# Patient Record
Sex: Female | Born: 1935 | Race: Black or African American | Hispanic: No | Smoking: Former smoker
Health system: Southern US, Community
[De-identification: ages and names within clinical notes are randomized; demographics above are authoritative.]

## PROBLEM LIST (undated history)

## (undated) DIAGNOSIS — K259 Gastric ulcer, unspecified as acute or chronic, without hemorrhage or perforation: Secondary | ICD-10-CM

## (undated) DIAGNOSIS — I6529 Occlusion and stenosis of unspecified carotid artery: Secondary | ICD-10-CM

## (undated) DIAGNOSIS — I639 Cerebral infarction, unspecified: Secondary | ICD-10-CM

## (undated) DIAGNOSIS — M949 Disorder of cartilage, unspecified: Secondary | ICD-10-CM

## (undated) DIAGNOSIS — I739 Peripheral vascular disease, unspecified: Secondary | ICD-10-CM

## (undated) DIAGNOSIS — I4891 Unspecified atrial fibrillation: Secondary | ICD-10-CM

## (undated) DIAGNOSIS — E119 Type 2 diabetes mellitus without complications: Secondary | ICD-10-CM

## (undated) DIAGNOSIS — M899 Disorder of bone, unspecified: Secondary | ICD-10-CM

## (undated) DIAGNOSIS — K5730 Diverticulosis of large intestine without perforation or abscess without bleeding: Secondary | ICD-10-CM

## (undated) DIAGNOSIS — I495 Sick sinus syndrome: Secondary | ICD-10-CM

## (undated) DIAGNOSIS — I10 Essential (primary) hypertension: Secondary | ICD-10-CM

## (undated) DIAGNOSIS — I82409 Acute embolism and thrombosis of unspecified deep veins of unspecified lower extremity: Secondary | ICD-10-CM

## (undated) DIAGNOSIS — K922 Gastrointestinal hemorrhage, unspecified: Secondary | ICD-10-CM

## (undated) DIAGNOSIS — K219 Gastro-esophageal reflux disease without esophagitis: Secondary | ICD-10-CM

## (undated) DIAGNOSIS — Z86010 Personal history of colonic polyps: Secondary | ICD-10-CM

## (undated) DIAGNOSIS — E785 Hyperlipidemia, unspecified: Secondary | ICD-10-CM

## (undated) HISTORY — DX: Peripheral vascular disease, unspecified: I73.9

## (undated) HISTORY — DX: Acute embolism and thrombosis of unspecified deep veins of unspecified lower extremity: I82.409

## (undated) HISTORY — PX: EYE SURGERY: SHX253

## (undated) HISTORY — DX: Type 2 diabetes mellitus without complications: E11.9

## (undated) HISTORY — DX: Gastric ulcer, unspecified as acute or chronic, without hemorrhage or perforation: K25.9

## (undated) HISTORY — DX: Essential (primary) hypertension: I10

## (undated) HISTORY — DX: Cerebral infarction, unspecified: I63.9

## (undated) HISTORY — PX: OTHER SURGICAL HISTORY: SHX169

## (undated) HISTORY — DX: Occlusion and stenosis of unspecified carotid artery: I65.29

## (undated) HISTORY — PX: CAROTID ENDARTERECTOMY: SUR193

## (undated) HISTORY — DX: Unspecified atrial fibrillation: I48.91

## (undated) HISTORY — DX: Sick sinus syndrome: I49.5

## (undated) HISTORY — DX: Gastro-esophageal reflux disease without esophagitis: K21.9

## (undated) HISTORY — DX: Disorder of cartilage, unspecified: M94.9

## (undated) HISTORY — PX: TUBAL LIGATION: SHX77

## (undated) HISTORY — DX: Diverticulosis of large intestine without perforation or abscess without bleeding: K57.30

## (undated) HISTORY — DX: Hyperlipidemia, unspecified: E78.5

## (undated) HISTORY — DX: Personal history of colonic polyps: Z86.010

## (undated) HISTORY — DX: Disorder of bone, unspecified: M89.9

---

## 1997-09-23 ENCOUNTER — Ambulatory Visit (HOSPITAL_COMMUNITY): Admission: RE | Admit: 1997-09-23 | Discharge: 1997-09-24 | Payer: Self-pay | Admitting: Urology

## 1999-07-09 ENCOUNTER — Encounter: Payer: Self-pay | Admitting: Family Medicine

## 1999-07-09 ENCOUNTER — Encounter: Admission: RE | Admit: 1999-07-09 | Discharge: 1999-07-09 | Payer: Self-pay | Admitting: Family Medicine

## 1999-09-03 ENCOUNTER — Encounter: Payer: Self-pay | Admitting: Ophthalmology

## 1999-09-07 ENCOUNTER — Ambulatory Visit (HOSPITAL_COMMUNITY): Admission: RE | Admit: 1999-09-07 | Discharge: 1999-09-07 | Payer: Self-pay | Admitting: Ophthalmology

## 2000-02-29 ENCOUNTER — Encounter: Admission: RE | Admit: 2000-02-29 | Discharge: 2000-02-29 | Payer: Self-pay | Admitting: Family Medicine

## 2000-02-29 ENCOUNTER — Encounter: Payer: Self-pay | Admitting: Family Medicine

## 2000-03-23 ENCOUNTER — Ambulatory Visit (HOSPITAL_COMMUNITY): Admission: RE | Admit: 2000-03-23 | Discharge: 2000-03-23 | Payer: Self-pay | Admitting: Cardiology

## 2000-04-05 DIAGNOSIS — I639 Cerebral infarction, unspecified: Secondary | ICD-10-CM

## 2000-04-05 HISTORY — DX: Cerebral infarction, unspecified: I63.9

## 2000-04-19 ENCOUNTER — Encounter: Payer: Self-pay | Admitting: *Deleted

## 2000-04-19 ENCOUNTER — Ambulatory Visit (HOSPITAL_COMMUNITY): Admission: RE | Admit: 2000-04-19 | Discharge: 2000-04-19 | Payer: Self-pay | Admitting: *Deleted

## 2000-04-28 ENCOUNTER — Ambulatory Visit (HOSPITAL_COMMUNITY): Admission: RE | Admit: 2000-04-28 | Discharge: 2000-04-28 | Payer: Self-pay | Admitting: Interventional Radiology

## 2000-05-19 ENCOUNTER — Ambulatory Visit (HOSPITAL_COMMUNITY): Admission: RE | Admit: 2000-05-19 | Discharge: 2000-05-19 | Payer: Self-pay | Admitting: *Deleted

## 2000-05-26 ENCOUNTER — Inpatient Hospital Stay (HOSPITAL_COMMUNITY): Admission: RE | Admit: 2000-05-26 | Discharge: 2000-05-27 | Payer: Self-pay | Admitting: Interventional Radiology

## 2000-05-26 ENCOUNTER — Encounter: Payer: Self-pay | Admitting: *Deleted

## 2000-05-30 ENCOUNTER — Emergency Department (HOSPITAL_COMMUNITY): Admission: EM | Admit: 2000-05-30 | Discharge: 2000-05-30 | Payer: Self-pay | Admitting: Emergency Medicine

## 2000-08-05 ENCOUNTER — Ambulatory Visit (HOSPITAL_COMMUNITY): Admission: RE | Admit: 2000-08-05 | Discharge: 2000-08-05 | Payer: Self-pay | Admitting: Ophthalmology

## 2000-12-23 ENCOUNTER — Ambulatory Visit (HOSPITAL_COMMUNITY): Admission: RE | Admit: 2000-12-23 | Discharge: 2000-12-23 | Payer: Self-pay | Admitting: Ophthalmology

## 2001-07-13 ENCOUNTER — Other Ambulatory Visit: Admission: RE | Admit: 2001-07-13 | Discharge: 2001-07-13 | Payer: Self-pay | Admitting: Family Medicine

## 2002-08-15 ENCOUNTER — Other Ambulatory Visit: Admission: RE | Admit: 2002-08-15 | Discharge: 2002-08-15 | Payer: Self-pay | Admitting: Family Medicine

## 2002-08-20 ENCOUNTER — Encounter: Admission: RE | Admit: 2002-08-20 | Discharge: 2002-08-20 | Payer: Self-pay | Admitting: Family Medicine

## 2002-08-20 ENCOUNTER — Encounter: Payer: Self-pay | Admitting: Family Medicine

## 2003-04-08 ENCOUNTER — Encounter: Payer: Self-pay | Admitting: Internal Medicine

## 2003-04-08 LAB — CONVERTED CEMR LAB

## 2003-08-19 ENCOUNTER — Other Ambulatory Visit: Admission: RE | Admit: 2003-08-19 | Discharge: 2003-08-19 | Payer: Self-pay | Admitting: Family Medicine

## 2004-05-13 ENCOUNTER — Encounter: Admission: RE | Admit: 2004-05-13 | Discharge: 2004-05-13 | Payer: Self-pay

## 2004-10-19 ENCOUNTER — Ambulatory Visit: Payer: Self-pay | Admitting: Internal Medicine

## 2004-10-28 ENCOUNTER — Ambulatory Visit: Payer: Self-pay | Admitting: Internal Medicine

## 2005-01-19 ENCOUNTER — Ambulatory Visit: Payer: Self-pay | Admitting: Internal Medicine

## 2005-04-05 DIAGNOSIS — Z86010 Personal history of colonic polyps: Secondary | ICD-10-CM

## 2005-04-05 DIAGNOSIS — Z860101 Personal history of adenomatous and serrated colon polyps: Secondary | ICD-10-CM

## 2005-04-05 HISTORY — DX: Personal history of adenomatous and serrated colon polyps: Z86.0101

## 2005-04-05 HISTORY — DX: Personal history of colonic polyps: Z86.010

## 2005-06-24 ENCOUNTER — Ambulatory Visit: Payer: Self-pay | Admitting: Internal Medicine

## 2005-09-03 ENCOUNTER — Ambulatory Visit: Payer: Self-pay | Admitting: Gastroenterology

## 2005-09-21 ENCOUNTER — Ambulatory Visit: Payer: Self-pay | Admitting: Gastroenterology

## 2005-10-01 ENCOUNTER — Ambulatory Visit: Payer: Self-pay | Admitting: Gastroenterology

## 2005-10-01 ENCOUNTER — Encounter (INDEPENDENT_AMBULATORY_CARE_PROVIDER_SITE_OTHER): Payer: Self-pay | Admitting: *Deleted

## 2005-11-09 ENCOUNTER — Ambulatory Visit: Payer: Self-pay | Admitting: Internal Medicine

## 2006-01-11 ENCOUNTER — Ambulatory Visit: Payer: Self-pay | Admitting: Internal Medicine

## 2006-06-07 ENCOUNTER — Ambulatory Visit: Payer: Self-pay | Admitting: Internal Medicine

## 2006-06-07 LAB — CONVERTED CEMR LAB
Albumin: 3.7 g/dL (ref 3.5–5.2)
BUN: 14 mg/dL (ref 6–23)
Basophils Absolute: 0 10*3/uL (ref 0.0–0.1)
Basophils Relative: 0.6 % (ref 0.0–1.0)
Chloride: 106 meq/L (ref 96–112)
Cholesterol: 150 mg/dL (ref 0–200)
Creatinine, Ser: 0.5 mg/dL (ref 0.4–1.2)
HCT: 33.9 % — ABNORMAL LOW (ref 36.0–46.0)
Hemoglobin, Urine: NEGATIVE
Hgb A1c MFr Bld: 6.4 % — ABNORMAL HIGH (ref 4.6–6.0)
Lymphocytes Relative: 22.7 % (ref 12.0–46.0)
Monocytes Relative: 10.6 % (ref 3.0–11.0)
Neutrophils Relative %: 63.8 % (ref 43.0–77.0)
RDW: 13.5 % (ref 11.5–14.6)
Sodium: 143 meq/L (ref 135–145)
Specific Gravity, Urine: 1.025 (ref 1.000–1.03)
TSH: 2.03 microintl units/mL (ref 0.35–5.50)
Total CHOL/HDL Ratio: 4
Total Protein, Urine: NEGATIVE mg/dL
Triglycerides: 164 mg/dL — ABNORMAL HIGH (ref 0–149)
Urobilinogen, UA: 0.2 (ref 0.0–1.0)
VLDL: 33 mg/dL (ref 0–40)

## 2006-11-29 ENCOUNTER — Encounter: Payer: Self-pay | Admitting: Internal Medicine

## 2006-11-29 DIAGNOSIS — I10 Essential (primary) hypertension: Secondary | ICD-10-CM

## 2006-11-29 DIAGNOSIS — E785 Hyperlipidemia, unspecified: Secondary | ICD-10-CM | POA: Insufficient documentation

## 2006-12-09 ENCOUNTER — Ambulatory Visit: Payer: Self-pay | Admitting: Internal Medicine

## 2006-12-25 ENCOUNTER — Encounter: Payer: Self-pay | Admitting: Internal Medicine

## 2006-12-25 DIAGNOSIS — I639 Cerebral infarction, unspecified: Secondary | ICD-10-CM | POA: Insufficient documentation

## 2006-12-25 DIAGNOSIS — K219 Gastro-esophageal reflux disease without esophagitis: Secondary | ICD-10-CM | POA: Insufficient documentation

## 2006-12-25 DIAGNOSIS — M899 Disorder of bone, unspecified: Secondary | ICD-10-CM | POA: Insufficient documentation

## 2006-12-25 DIAGNOSIS — M949 Disorder of cartilage, unspecified: Secondary | ICD-10-CM

## 2007-01-28 ENCOUNTER — Ambulatory Visit: Payer: Self-pay | Admitting: Internal Medicine

## 2007-06-05 LAB — HM MAMMOGRAPHY: HM Mammogram: NORMAL

## 2007-07-11 ENCOUNTER — Encounter: Payer: Self-pay | Admitting: Internal Medicine

## 2007-08-01 ENCOUNTER — Ambulatory Visit: Payer: Self-pay | Admitting: Internal Medicine

## 2007-08-01 DIAGNOSIS — E119 Type 2 diabetes mellitus without complications: Secondary | ICD-10-CM

## 2007-08-01 DIAGNOSIS — Z860100 Personal history of colon polyps, unspecified: Secondary | ICD-10-CM | POA: Insufficient documentation

## 2007-08-01 DIAGNOSIS — K5730 Diverticulosis of large intestine without perforation or abscess without bleeding: Secondary | ICD-10-CM | POA: Insufficient documentation

## 2007-08-01 DIAGNOSIS — M25519 Pain in unspecified shoulder: Secondary | ICD-10-CM

## 2007-08-02 LAB — CONVERTED CEMR LAB
AST: 17 units/L (ref 0–37)
Albumin: 3.8 g/dL (ref 3.5–5.2)
CO2: 29 meq/L (ref 19–32)
Calcium: 9.5 mg/dL (ref 8.4–10.5)
Cholesterol: 134 mg/dL (ref 0–200)
Creatinine, Ser: 1.1 mg/dL (ref 0.4–1.2)
Creatinine,U: 130.8 mg/dL
Eosinophils Absolute: 0.3 10*3/uL (ref 0.0–0.7)
GFR calc Af Amer: 62 mL/min
GFR calc non Af Amer: 51 mL/min
Glucose, Bld: 112 mg/dL — ABNORMAL HIGH (ref 70–99)
HCT: 39 % (ref 36.0–46.0)
HDL: 34.8 mg/dL — ABNORMAL LOW (ref >39.0)
Hemoglobin: 13 g/dL (ref 12.0–15.0)
Ketones, ur: NEGATIVE mg/dL
MCHC: 33.2 g/dL (ref 30.0–36.0)
Microalb Creat Ratio: 50.5 mg/g — ABNORMAL HIGH (ref 0.0–30.0)
Microalb, Ur: 6.6 mg/dL — ABNORMAL HIGH (ref 0.0–1.9)
Monocytes Relative: 7.2 % (ref 3.0–12.0)
Platelets: 390 10*3/uL (ref 150–400)
RBC: 4.33 M/uL (ref 3.87–5.11)
Total CHOL/HDL Ratio: 3.9
Triglycerides: 161 mg/dL — ABNORMAL HIGH (ref 0–149)
Urobilinogen, UA: 0.2 (ref 0.0–1.0)
WBC: 7.1 10*3/uL (ref 4.5–10.5)

## 2007-08-04 ENCOUNTER — Encounter (INDEPENDENT_AMBULATORY_CARE_PROVIDER_SITE_OTHER): Payer: Self-pay | Admitting: *Deleted

## 2007-08-22 ENCOUNTER — Encounter: Payer: Self-pay | Admitting: Internal Medicine

## 2007-08-22 ENCOUNTER — Encounter: Admission: RE | Admit: 2007-08-22 | Discharge: 2007-08-22 | Payer: Self-pay | Admitting: Specialist

## 2007-08-31 ENCOUNTER — Encounter: Payer: Self-pay | Admitting: Internal Medicine

## 2007-08-31 ENCOUNTER — Encounter: Admission: RE | Admit: 2007-08-31 | Discharge: 2007-08-31 | Payer: Self-pay | Admitting: Specialist

## 2007-10-10 ENCOUNTER — Encounter: Payer: Self-pay | Admitting: Internal Medicine

## 2008-01-30 ENCOUNTER — Ambulatory Visit: Payer: Self-pay | Admitting: Internal Medicine

## 2008-03-12 ENCOUNTER — Ambulatory Visit: Payer: Self-pay | Admitting: Internal Medicine

## 2008-04-05 HISTORY — PX: OTHER SURGICAL HISTORY: SHX169

## 2008-07-30 ENCOUNTER — Ambulatory Visit: Payer: Self-pay | Admitting: Internal Medicine

## 2008-08-20 ENCOUNTER — Encounter: Payer: Self-pay | Admitting: Internal Medicine

## 2008-08-29 ENCOUNTER — Encounter: Payer: Self-pay | Admitting: Internal Medicine

## 2008-10-01 ENCOUNTER — Ambulatory Visit: Payer: Self-pay | Admitting: Internal Medicine

## 2008-10-01 DIAGNOSIS — K5900 Constipation, unspecified: Secondary | ICD-10-CM | POA: Insufficient documentation

## 2008-10-01 DIAGNOSIS — R221 Localized swelling, mass and lump, neck: Secondary | ICD-10-CM

## 2008-10-01 DIAGNOSIS — R220 Localized swelling, mass and lump, head: Secondary | ICD-10-CM

## 2008-10-08 ENCOUNTER — Encounter: Payer: Self-pay | Admitting: Internal Medicine

## 2008-10-22 ENCOUNTER — Ambulatory Visit: Payer: Self-pay | Admitting: Internal Medicine

## 2008-10-24 ENCOUNTER — Telehealth: Payer: Self-pay | Admitting: Internal Medicine

## 2008-10-24 ENCOUNTER — Encounter: Payer: Self-pay | Admitting: Internal Medicine

## 2008-10-25 ENCOUNTER — Encounter: Payer: Self-pay | Admitting: Internal Medicine

## 2008-11-04 ENCOUNTER — Telehealth (INDEPENDENT_AMBULATORY_CARE_PROVIDER_SITE_OTHER): Payer: Self-pay | Admitting: *Deleted

## 2008-11-05 ENCOUNTER — Encounter: Payer: Self-pay | Admitting: Internal Medicine

## 2008-11-05 ENCOUNTER — Encounter: Payer: Self-pay | Admitting: Cardiovascular Disease

## 2008-11-05 ENCOUNTER — Ambulatory Visit: Payer: Self-pay

## 2008-11-06 ENCOUNTER — Telehealth (INDEPENDENT_AMBULATORY_CARE_PROVIDER_SITE_OTHER): Payer: Self-pay | Admitting: *Deleted

## 2008-11-07 ENCOUNTER — Telehealth: Payer: Self-pay | Admitting: Internal Medicine

## 2008-11-12 ENCOUNTER — Telehealth: Payer: Self-pay | Admitting: Internal Medicine

## 2009-01-28 ENCOUNTER — Ambulatory Visit: Payer: Self-pay | Admitting: Internal Medicine

## 2009-01-28 LAB — CONVERTED CEMR LAB
BUN: 21 mg/dL (ref 6–23)
CO2: 28 meq/L (ref 19–32)
Calcium: 10.1 mg/dL (ref 8.4–10.5)
Cholesterol: 149 mg/dL (ref 0–200)
Creatinine, Ser: 1.1 mg/dL (ref 0.4–1.2)
Glucose, Bld: 96 mg/dL (ref 70–99)
HDL: 43.9 mg/dL (ref >39.00)
Hgb A1c MFr Bld: 6 % (ref 4.6–6.5)

## 2009-04-05 DIAGNOSIS — I495 Sick sinus syndrome: Secondary | ICD-10-CM

## 2009-04-05 HISTORY — DX: Sick sinus syndrome: I49.5

## 2009-04-05 HISTORY — PX: ILIAC ARTERY STENT: SHX1786

## 2009-11-10 ENCOUNTER — Encounter (INDEPENDENT_AMBULATORY_CARE_PROVIDER_SITE_OTHER): Payer: Self-pay | Admitting: *Deleted

## 2009-11-10 ENCOUNTER — Ambulatory Visit: Payer: Self-pay | Admitting: Gastroenterology

## 2009-11-10 LAB — CONVERTED CEMR LAB
AST: 18 units/L (ref 0–37)
Albumin: 4 g/dL (ref 3.5–5.2)
Alkaline Phosphatase: 37 units/L — ABNORMAL LOW (ref 39–117)
Basophils Absolute: 0 10*3/uL (ref 0.0–0.1)
Bilirubin, Direct: 0.1 mg/dL (ref 0.0–0.3)
CO2: 29 meq/L (ref 19–32)
Calcium: 10 mg/dL (ref 8.4–10.5)
Folate: >20 ng/mL
Hemoglobin: 13 g/dL (ref 12.0–15.0)
Lymphocytes Relative: 21.4 % (ref 12.0–46.0)
Monocytes Absolute: 0.8 10*3/uL (ref 0.1–1.0)
Monocytes Relative: 10.2 % (ref 3.0–12.0)
Neutro Abs: 5 10*3/uL (ref 1.4–7.7)
Neutrophils Relative %: 64.5 % (ref 43.0–77.0)
RDW: 15.2 % — ABNORMAL HIGH (ref 11.5–14.6)
Sed Rate: 11 mm/hr (ref 0–22)
Sodium: 142 meq/L (ref 135–145)
TSH: 2.59 microintl units/mL (ref 0.35–5.50)
Tissue Transglutaminase Ab, IgA: 11.5 units (ref <20)
Total Bilirubin: 0.3 mg/dL (ref 0.3–1.2)
Transferrin: 303.3 mg/dL (ref 212.0–360.0)

## 2009-11-14 ENCOUNTER — Telehealth: Payer: Self-pay | Admitting: Gastroenterology

## 2009-11-28 ENCOUNTER — Ambulatory Visit: Payer: Self-pay | Admitting: Gastroenterology

## 2009-11-28 LAB — HM COLONOSCOPY

## 2009-12-03 ENCOUNTER — Encounter: Payer: Self-pay | Admitting: Gastroenterology

## 2010-02-19 ENCOUNTER — Ambulatory Visit: Payer: Self-pay | Admitting: Internal Medicine

## 2010-02-19 DIAGNOSIS — M79609 Pain in unspecified limb: Secondary | ICD-10-CM

## 2010-02-20 ENCOUNTER — Ambulatory Visit: Payer: Self-pay

## 2010-02-20 ENCOUNTER — Emergency Department (HOSPITAL_COMMUNITY): Admission: EM | Admit: 2010-02-20 | Discharge: 2010-02-20 | Payer: Self-pay | Admitting: Emergency Medicine

## 2010-02-20 ENCOUNTER — Encounter: Payer: Self-pay | Admitting: Internal Medicine

## 2010-02-23 ENCOUNTER — Telehealth: Payer: Self-pay | Admitting: Internal Medicine

## 2010-02-23 DIAGNOSIS — I7389 Other specified peripheral vascular diseases: Secondary | ICD-10-CM

## 2010-02-24 ENCOUNTER — Encounter: Payer: Self-pay | Admitting: Internal Medicine

## 2010-02-24 ENCOUNTER — Ambulatory Visit: Payer: Self-pay | Admitting: Vascular Surgery

## 2010-02-27 ENCOUNTER — Ambulatory Visit (HOSPITAL_COMMUNITY): Admission: RE | Admit: 2010-02-27 | Discharge: 2010-02-27 | Payer: Self-pay | Admitting: Surgery

## 2010-02-27 ENCOUNTER — Ambulatory Visit: Payer: Self-pay | Admitting: Surgery

## 2010-02-27 ENCOUNTER — Ambulatory Visit: Payer: Self-pay | Admitting: Cardiology

## 2010-02-27 ENCOUNTER — Encounter: Payer: Self-pay | Admitting: Internal Medicine

## 2010-03-06 ENCOUNTER — Ambulatory Visit: Payer: Self-pay

## 2010-03-10 ENCOUNTER — Ambulatory Visit: Payer: Self-pay | Admitting: Cardiology

## 2010-03-10 DIAGNOSIS — I495 Sick sinus syndrome: Secondary | ICD-10-CM

## 2010-03-10 DIAGNOSIS — I6529 Occlusion and stenosis of unspecified carotid artery: Secondary | ICD-10-CM | POA: Insufficient documentation

## 2010-03-12 ENCOUNTER — Telehealth (INDEPENDENT_AMBULATORY_CARE_PROVIDER_SITE_OTHER): Payer: Self-pay | Admitting: *Deleted

## 2010-03-16 ENCOUNTER — Encounter (HOSPITAL_COMMUNITY)
Admission: RE | Admit: 2010-03-16 | Discharge: 2010-05-05 | Payer: Self-pay | Source: Home / Self Care | Attending: Cardiology | Admitting: Cardiology

## 2010-03-16 ENCOUNTER — Ambulatory Visit: Payer: Self-pay

## 2010-03-16 ENCOUNTER — Encounter: Payer: Self-pay | Admitting: Cardiology

## 2010-03-17 ENCOUNTER — Encounter: Payer: Self-pay | Admitting: Internal Medicine

## 2010-03-17 ENCOUNTER — Ambulatory Visit: Payer: Self-pay | Admitting: Vascular Surgery

## 2010-03-20 ENCOUNTER — Telehealth: Payer: Self-pay | Admitting: Cardiology

## 2010-03-20 ENCOUNTER — Encounter (INDEPENDENT_AMBULATORY_CARE_PROVIDER_SITE_OTHER): Payer: Self-pay | Admitting: *Deleted

## 2010-04-29 LAB — URINALYSIS, ROUTINE W REFLEX MICROSCOPIC
Ketones, ur: NEGATIVE mg/dL
Protein, ur: NEGATIVE mg/dL
Urine Glucose, Fasting: NEGATIVE mg/dL
pH: 6 (ref 5.0–8.0)

## 2010-04-29 LAB — COMPREHENSIVE METABOLIC PANEL
ALT: 11 U/L (ref 0–35)
AST: 20 U/L (ref 0–37)
Albumin: 3.9 g/dL (ref 3.5–5.2)
Alkaline Phosphatase: 35 U/L — ABNORMAL LOW (ref 39–117)
CO2: 28 mEq/L (ref 19–32)
Calcium: 9.9 mg/dL (ref 8.4–10.5)
Chloride: 104 mEq/L (ref 96–112)
Creatinine, Ser: 1.11 mg/dL (ref 0.4–1.2)
Glucose, Bld: 98 mg/dL (ref 70–99)
Sodium: 141 mEq/L (ref 135–145)
Total Protein: 6.9 g/dL (ref 6.0–8.3)

## 2010-04-29 LAB — URINE MICROSCOPIC-ADD ON

## 2010-04-29 LAB — PROTIME-INR: Prothrombin Time: 13.8 seconds (ref 11.6–15.2)

## 2010-04-29 LAB — CBC
Hemoglobin: 13.4 g/dL (ref 12.0–15.0)
MCV: 90.6 fL (ref 78.0–100.0)
RDW: 14.7 % (ref 11.5–15.5)

## 2010-04-30 ENCOUNTER — Inpatient Hospital Stay (HOSPITAL_COMMUNITY)
Admission: RE | Admit: 2010-04-30 | Discharge: 2010-05-01 | Payer: Self-pay | Source: Home / Self Care | Attending: Vascular Surgery | Admitting: Vascular Surgery

## 2010-04-30 ENCOUNTER — Encounter: Payer: Self-pay | Admitting: Internal Medicine

## 2010-05-01 LAB — CBC
MCH: 28.7 pg (ref 26.0–34.0)
RDW: 14.5 % (ref 11.5–15.5)

## 2010-05-01 LAB — BASIC METABOLIC PANEL
CO2: 26 mEq/L (ref 19–32)
Calcium: 9.1 mg/dL (ref 8.4–10.5)
Creatinine, Ser: 0.86 mg/dL (ref 0.4–1.2)
GFR calc Af Amer: >60 mL/min (ref >60)
GFR calc non Af Amer: >60 mL/min (ref >60)
Potassium: 3.9 mEq/L (ref 3.5–5.1)
Sodium: 139 mEq/L (ref 135–145)

## 2010-05-01 LAB — CROSSMATCH
Antibody Screen: NEGATIVE
Unit division: 0
Unit division: 0

## 2010-05-01 NOTE — Consult Note (Addendum)
Tracy [name], Tracy [name]              ACCOUNT NO.:  [id number]  MEDICAL RECORD NO.:  [id number]          PATIENT TYPE:  INP  LOCATION:  3301                         FACILITY:  [facility]  PHYSICIAN:  Tracy Range, MD       DATE OF BIRTH:  Jun 27, 1926  DATE OF CONSULTATION:  04/30/2010 DATE OF DISCHARGE:                                CONSULTATION   PRIMARY CARDIOLOGIST:  The patient saw Tracy [name] in consultation in November 2011, with an office visit in December 2011.  Tracy [name] will be seeing her this admission for electrophysiology.  PRIMARY MEDICAL DOCTOR:  Tracy Levins, MD  CHIEF COMPLAINT:  "I had surgery."  REASON FOR CONSULTATION:  Bradycardia.  HISTORY OF PRESENT ILLNESS:  Tracy [name] is an 77 year old female with a prior history of PVD, hypertension, hyperlipidemia, but no prior history of coronary artery disease.  We initially met her in 2011 postoperatively following bilateral iliac stenting.  At that time, she had bradycardia mixing both sinus brady as well as central rhythm with occasional 3-second pauses.  She was asymptomatic at that time with stable blood pressure.  The patient had an event monitor at discharge, which showed similar findings with bradycardia with heart rates into the 30s and 3-second pauses.  The patient denied any dizziness, shortness of breath, syncope, and was completely asymptomatic.  She did not want a pacemaker at that time.  She returns today for left carotid endarterectomy by Dr. Hart Rochester.  As per report in the OR under anesthesia and with intubation was bradycardic with heart rates in the 30s, occasional junctional with pauses up to 3 seconds.  She had to be transcutaneously paced during much of the procedure according to the nurses staff.  Labs were stable on April 28, 2010.  Her blood pressure remains stable as well, even on the higher side of 140s-150s.  Since activation, her heart rate jumped back up to the 70s and then has been settled  done into the 50s-60s.  However, once falling asleep, her heart rate jumps down into the upper 20s and 30s.  She is asymptomatic from this and has no subsequent blood pressure change.  She denies any chest pain, shortness of breath, or dizziness and at present just has some pain at her incision site.  Her EKG shows sinus bradycardia and junctional rhythm with Qs in V1 through V3, Q-wave inversion 2 through aVF with no change from November 2011.  Of note, she had a nuclear study on April 16, 2010, which showed no ischemic infarct and an EF of 77%.  PAST MEDICAL HISTORY: 1. Bradycardia, please see above for details. 2. Abnormal EKG with Lexiscan nuclear study on April 16, 2010, that     was normal showing no ischemia or infarct with an EF of 77%. 3. Peripheral vascular disease.     a.     Subsequent bilateral iliac stenting on February 27, 2010.     b.     Left internal carotid artery stenosis greater than 90% by      Doppler in November 2011 status post left BEA today.  She did have  right internal carotid stenosis up to 50% to 60%. 4. Osteopenia. 5. Diet-controlled diabetes. 6. Hypertension. 7. Hyperlipidemia. 8. TIA in 2003. 9. Tubular adenoma and colon polyps in 2007. 10.GERD by EGD.  PAST SURGICAL HISTORY:  Shoulder surgery, laser eye/cataract surgery, and tubal ligation as well as above-mentioned procedures.  MEDICATIONS: 1. Actonel 35 mg daily. 2. Aspirin 81 mg daily. 3. Benefiber. 4. Calcium carbonate. 5. Fenofibrate 134 mg. 6. Lotrel 10/40 mg. 7. Multivitamin. 8. Pentoxifylline 400 mg 1 tablet b.i.d. 9. Plavix 75 mg daily.  DRUG ALLERGIES:  No known drug allergies.  SOCIAL HISTORY:  Tracy [name] is widowed and lives with her son in [name]  She quit cigarettes approximately 20 years ago and denies any alcohol use.  She bowls on a regular basis.  FAMILY HISTORY:  Negative for CAD or CVA.  She has a sibling with diabetes.  REVIEW OF SYSTEMS:  No fevers,  chills, sweats.  No chest pain, shortness of breath, dyspnea on exertion, dizziness.  Some pain at her incision site.  No syncope.  All other systems reviewed and otherwise negative except as noted in the HPI.  LABORATORY DATA:  These labs were obtained postoperatively on April 28, 2010.  WBC 11.0, hemoglobin 13.4, hematocrit 42.3, platelet count 434.  Sodium 141, potassium 4.2, chloride 104, CO2 of 20, glucose 90, BUN 19, creatinine 1.11.  LFTs within normal limits with the exception of decreased alk phos of 35.  UA showed trace leuks and calcium oxalate crystals, but otherwise negative.  RADIOLOGY:  Preop chest x-ray April 28, 2009, showed stable with multiple calcified pleural plaques and hyperaeration.  No active ischemic disease.  PHYSICAL EXAMINATION:  VITAL SIGNS:  Temperature 98.9, pulse 54, respirations 20, blood pressure 144/52, pulse ox 98% on 4 L. GENERAL:  This is a pleasant elderly African American female in no acute distress, but is waking up from surgery. HEENT:  Normocephalic, atraumatic with extraocular movements intact. Clear sclerae.  Nares without discharge. NECK:  Supple with a clean left neck incision without separation or oozing. HEART:  Auscultation of the heart reveals regular rhythm, bradycardic with a soft quiet systolic ejection murmur. LUNGS:  Auscultation of the lungs reveals clear breath sounds bilaterally.  Breathing is unlabored. ABDOMEN:  Soft, nontender, and nondistended with positive bowel sounds. EXTREMITIES:  Warm, dry, and with 2+ pedal pulses bilaterally. NEUROLOGIC:  She is alert and oriented x3 and responds to questions appropriately.  She is slightly confused at the moment, but becomes more lucid with every minute in the interview.  She states she wants to get out of bed.  ASSESSMENT/PLAN:  The patient was seen and examined by Tracy [name], electrophysiologist, and myself.  This is an 77 year old female with history of peripheral  vascular disease, normal stress test, hypertension, hyperlipidemia, and a history of bradycardia as outlined above.  She presented to [facility] with left carotid endarterectomy for carotid artery stenosis today.  During that procedure, she was noted to be bradycardic, occasionally junctional with pauses up to 3 seconds.  This rhythm was previously seen in her event monitor at home in the ambulatory setting.  She has been asymptomatic throughout all of these episodes and it does not appear that she has any blood pressure variation with this as well.  In the past, she has declined pacemaker implantation.  She continues to have heart rates in the 50s to 60s with occasional drops down into the upper 20s and 30s, but is still asymptomatic.  At this time,  we will follow conservatively. Keep atropine available at bedside.  If she becomes symptomatic with her bradycardia, we would consider dopamine drip in the short-term.  Once the patient is more alert, Tracy [name] will discuss the symptoms of bradycardia with the patient, also the possibility of pacemaker implantation.  We will continue to follow with you.  Thank you for the opportunity to participate in the care of this patient.     Ronie Spies, P.A.C.   ______________________________ [place] Range, MD    DD/MEDQ  D:  04/30/2010  T:  05/01/2010  Job:  [id number]  cc:   Tracy Levins, MD  Electronically Signed by Ronie Spies  on 05/01/2010 10:45:09 AM Electronically Signed by Tracy Range MD on 05/20/2010 10:22:02 PM

## 2010-05-03 LAB — CONVERTED CEMR LAB
AST: 17 units/L (ref 0–37)
Albumin: 4 g/dL (ref 3.5–5.2)
Alkaline Phosphatase: 38 units/L — ABNORMAL LOW (ref 39–117)
Bilirubin Urine: NEGATIVE
Bilirubin, Direct: 0.2 mg/dL (ref 0.0–0.3)
HCT: 39.7 % (ref 36.0–46.0)
HDL: 37.8 mg/dL — ABNORMAL LOW (ref >39.00)
Hemoglobin, Urine: NEGATIVE
Hemoglobin: 13.5 g/dL (ref 12.0–15.0)
Hgb A1c MFr Bld: 6.3 % (ref 4.6–6.5)
Ketones, ur: NEGATIVE mg/dL
MCHC: 33.9 g/dL (ref 30.0–36.0)
Microalb Creat Ratio: 32.3 mg/g — ABNORMAL HIGH (ref 0.0–30.0)
Potassium: 4.2 meq/L (ref 3.5–5.1)
RDW: 13.7 % (ref 11.5–14.6)
Total CHOL/HDL Ratio: 4
Total Protein: 7.6 g/dL (ref 6.0–8.3)
Urine Glucose: NEGATIVE mg/dL
Urobilinogen, UA: 0.2 (ref 0.0–1.0)
VLDL: 25 mg/dL (ref 0.0–40.0)
Vit D, 25-Hydroxy: 50 ng/mL (ref 30–89)
WBC: 7.9 10*3/uL (ref 4.5–10.5)
pH: 6.5 (ref 5.0–8.0)

## 2010-05-04 NOTE — Op Note (Signed)
[name], [name]              ACCOUNT NO.:  [id number]  MEDICAL RECORD NO.:  [id number]          PATIENT TYPE:  INP  LOCATION:  3301                         FACILITY:  [facility]  PHYSICIAN:  Quita Skye. Hart Rochester, M.D.  DATE OF BIRTH:  Mar 10, 1927  DATE OF PROCEDURE:  04/30/2010 DATE OF DISCHARGE:                              OPERATIVE REPORT   PREOPERATIVE DIAGNOSIS:  Severe left internal carotid stenosis - asymptomatic.  POSTOPERATIVE DIAGNOSIS:  Severe left internal carotid stenosis - asymptomatic.  OPERATION:  Left carotid endarterectomy with Dacron patch angioplasty.  SURGEON:  Quita Skye. Hart Rochester, MD  FIRST ASSISTANT:  Pecola Leisure, PA  ANESTHESIA:  General endotracheal.  DRAINS:  One JP drain.  BRIEF HISTORY:  This 77 year old female has recently had endovascular procedures to treat ischemia of the left lower extremity.  She was found at that time to have a high-grade left internal carotid stenosis exceeding 90% in severity.  She is asymptomatic.  She also has some degree of heart block and has been evaluated by Dr. Charlies Constable.  The patient declined further electrophysiologic evaluation.  She now presents for an elective left carotid endarterectomy.  PROCEDURE IN DETAIL:  The patient was taken to the operating room and placed in a supine position at which time satisfactory general endotracheal anesthesia was administered.  Left neck was prepped with Betadine scrub and solution and draped in routine sterile manner. Incision was made along the anterior border of the sternocleidomastoid muscle and carried down through subcutaneous tissue and platysma using the Bovie.  Common facial vein and external jugular veins were ligated with 3-0 silk ties and divided exposing the common internal and external carotid arteries.  Care was taken not to injure the vagus or hypoglossal nerves, both of which were exposed.  There was a calcified atherosclerotic plaque at the carotid  bifurcation extending up the internal carotid artery posteriorly about 3-4 cm up to the crossing of the hypoglossal nerve which was mobilized.  Plaque itself was quite severely calcified with a diminished pulse distally.  A #10 shunt was prepared and the patient was heparinized.  Carotid vessels were occluded with vascular clamps.  Longitudinal opening made in the common carotid with #15 blade and extended up the internal carotid with Potts scissors to a point distal to the disease.  The plaque was almost totally occlusive in nature but excellent backbleeding from above.  The #10 shunt was inserted without difficulty reestablishing flow in about 2 minutes.  Standard endarterectomy was then performed using the elevator and Potts scissors with an eversion endarterectomy of the external carotid.  The plaque feathered off the distal internal carotid artery nicely not requiring any tacking sutures.  Lumen was thoroughly irrigated with heparin and saline.  All loose debris carefully removed and arteriotomy was closed with a patch using continuous 6-0 Prolene. Prior to completion of closure, shunt was removed after about 30 minutes shunt time following antegrade and retrograde flushing.  Closure was completed reestablishing flow, initially up the external and up the internal branch.  Carotid was occluded for less than 2 minutes for removal of the shunt.  Protamine was then given  to reverse the heparin following adequate hemostasis.  There continued to be some slight oozing due to the patient's aspirin and Plavix medications.  A Jackson-Pratt drain was brought out through an inferiorly based stab wound and secured with silk suture.  Adequate hemostasis was achieved.  The wound closed in layers with Vicryl in a subcuticular fashion.  Sterile dressing was applied.  The patient was taken to the recovery in satisfactory condition.     Quita Skye Hart Rochester, M.D.     JDL/MEDQ  D:  04/30/2010  T:   05/01/2010  Job:  [id number]  Electronically Signed by Josephina Gip M.D. on 05/04/2010 10:31:06 AM

## 2010-05-05 NOTE — Procedures (Signed)
Summary: Colonoscopy  Patient: [place] [name] Note: All result statuses are Final unless otherwise noted.  Tests: (1) Colonoscopy (COL)   COL Colonoscopy           DONE     [facility]     [address]     [place], [place]  [address]           COLONOSCOPY PROCEDURE REPORT           PATIENT:  Tracy, [name]  MR#:  [id number]     BIRTHDATE:  05-Sep-1926, 82 yrs. old  GENDER:  female     ENDOSCOPIST:  Vania Rea. Jarold Motto, MD, [facility]     REF. BY:     PROCEDURE DATE:  11/28/2009     PROCEDURE:  Colonoscopy with biopsy     ASA CLASS:  Class II     INDICATIONS:  hematochezia, family history of colon cancer     MEDICATIONS:   Fentanyl 50 mcg IV, Versed 5 mg IV           DESCRIPTION OF PROCEDURE:   After the risks benefits and     alternatives of the procedure were thoroughly explained, informed     consent was obtained.  Digital rectal exam was performed and     revealed normal prostate and no rectal masses.   The LB [id number]-[id number]     [id number] endoscope was introduced through the anus and advanced to     the cecum, which was identified by both the appendix and ileocecal     valve, limited by a redundant colon.    The quality of the prep     was good, using MoviPrep.  The instrument was then slowly     withdrawn as the colon was fully examined.     <<PROCEDUREIMAGES>>           FINDINGS:  Severe diverticulosis was found in the sigmoid to     descending colon segments.  A mass was found. FLESHT LARGE ERODED     FOLDS BIOOPSIED.C/W RECTAL PROLAPSE.   Retroflexed views in the     rectum revealed it was not tolerated by the patient.    The scope     was then withdrawn from the patient and the procedure completed.           COMPLICATIONS:  None     ENDOSCOPIC IMPRESSION:     1) Severe diverticulosis in the sigmoid to descending colon     segments     2) Mass     RECTAL PROLAPSE SYNDROME AND PSEUDOTUMOR,RECTAL ULCER SYNDROME.           RECOMMENDATIONS:     1) high fiber diet  2) metamucil or benefiber     3) Await biopsy results     REPEAT EXAM:  No           ______________________________     Vania Rea. Jarold Motto, MD, [name]           CC:  Tracy Levins, MD           n.     Tracy [name] Kitchen   Vania Rea. Patterson at 11/28/2009 02:36 PM           Casilda Carls, [id number]  Note: An exclamation mark (!) indicates a result that was not dispersed into the flowsheet. Document Creation Date: 11/28/2009 2:36 PM _______________________________________________________________________  (1) Order result status: Final Collection or observation date-time: 11/28/2009 14:27 Requested date-time:  Receipt  date-time:  Reported date-time:  Referring Physician:   Ordering Physician: Sheryn Bison [phone]) Specimen Source:  Source: [place] [name] Order Number: [id number] Lab site:

## 2010-05-05 NOTE — Letter (Signed)
Summary: [facility]  [facility]   Imported By: Sherian Rein 03/05/2010 12:15:55  _____________________________________________________________________  External Attachment:    Type:   Image     Comment:   External Document

## 2010-05-05 NOTE — Consult Note (Signed)
Summary: [facility]   Imported By: Sherian Rein 03/05/2010 09:05:39  _____________________________________________________________________  External Attachment:    Type:   Image     Comment:   External Document

## 2010-05-05 NOTE — Assessment & Plan Note (Signed)
Summary: problems w/bowels--ch.    History of Present Illness Visit Type: new patient  Primary GI MD: Sheryn Bison MD FACP FAGA Primary Provider: Oliver Barre, MD  Requesting Provider: na Chief Complaint: Constipation, and patient states when she has a BM it comes out as liitle pellets History of Present Illness:   77 year old African American female presents with fecal incontinency increased constipation over the last several months with associated gas and bloating but no real rectal bleeding, upper GI, hepatobiliary, or systemic complaints. Last colonoscopy 4 years ago showed extensive diverticulosis and adenomatous polyp which was removed. She is chronically on Plavix and aspirin her previous CNS vascular shunt. She denies current neurologic or cardiovascular problems. She recently had shoulder surgery and was able to hold her Plavix 5 days before the surgery without complications. Review of her chart shows lab data greater than a year ago without evidence of anemia or abnormal liver functions.    GI Review of Systems      Denies abdominal pain, acid reflux, belching, bloating, chest pain, dysphagia with liquids, dysphagia with solids, heartburn, loss of appetite, nausea, vomiting, vomiting blood, weight loss, and  weight gain.      Reports change in bowel habits and  constipation.     Denies anal fissure, black tarry stools, diarrhea, diverticulosis, fecal incontinence, heme positive stool, hemorrhoids, irritable bowel syndrome, jaundice, light color stool, liver problems, rectal bleeding, and  rectal pain.    Current Medications (verified): 1)  Caltrate 600 1500 Mg  Tabs (Calcium Carbonate) .... By Mouth Two Times A Day 2)  Plavix 75 Mg  Tabs (Clopidogrel Bisulfate) .... Once Daily 3)  Multivitamins  Caps (Multiple Vitamin) .... One Capsule By Mouth Once Daily 4)  Fenofibrate Micronized 134 Mg Caps (Fenofibrate Micronized) .Marland Kitchen.. 1 By Mouth Once Daily 5)  Actonel 35 Mg  Tabs  (Risedronate Sodium) .Marland Kitchen.. 1 Weekly By Mouth 6)  Lotrel 10-40 Mg Caps (Amlodipine Besy-Benazepril Hcl) .Marland Kitchen.. 1 By Mouth Once Daily 7)  Aspirin 81 Mg  Tabs (Aspirin) .... One Tablet By Mouth Once Daily  Allergies (verified): No Known Drug Allergies  Past History:  Past medical, surgical, family and social histories (including risk factors) reviewed for relevance to current acute and chronic problems.  Past Medical History: Reviewed history from 01/30/2008 and no changes required. Hyperlipidemia Hypertension GERD Cerebrovascular accident, hx of- 2002 Osteopenia Colonic polyps, hx of - adenomatous 2007 Diverticulosis, colon Diabetes mellitus, type II - diet  Past Surgical History: Reviewed history from 01/28/2009 and no changes required. s/p CNS vascular stent per neurosurgeon Tubal ligation s/p right shoulder surgury 2010  Family History: Reviewed history from 08/01/2007 and no changes required. Family History of Colon Cancer: Mother   Social History: Reviewed history from 08/01/2007 and no changes required. Retired-[employer] Widowed 5 childern Patient is a former smoker.  Alcohol Use - no Daily Caffeine Use: 4 daily  Illicit Drug Use - no Drug Use:  no  Review of Systems       The patient complains of muscle pains/cramps and sleeping problems.  The patient denies allergy/sinus, anemia, anxiety-new, arthritis/joint pain, back pain, blood in urine, breast changes/lumps, change in vision, confusion, cough, coughing up blood, depression-new, fainting, fatigue, fever, headaches-new, hearing problems, heart murmur, heart rhythm changes, itching, menstrual pain, night sweats, nosebleeds, pregnancy symptoms, shortness of breath, skin rash, sore throat, swelling of feet/legs, swollen lymph glands, thirst - excessive , urination - excessive , urination changes/pain, urine leakage, vision changes, and voice change.    Vital  Signs:  Patient profile:   77 year old female Height:      60  inches Weight:      114 pounds BMI:     22.34 BSA:     1.47 Pulse rate:   58 / minute Pulse rhythm:   regular BP sitting:   128 / 60  (left arm) Cuff size:   regular  Vitals Entered By: Ok Anis CMA (November 10, 2009 2:54 PM)  Physical Exam  General:  Well developed, well nourished, no acute distress.healthy appearing.   Head:  Normocephalic and atraumatic. Eyes:  PERRLA, no icterus.exam deferred to patient's ophthalmologist.   Lungs:  Clear throughout to auscultation. Heart:  Regular rate and rhythm; no murmurs, rubs,  or bruits. Abdomen:  Soft, nontender and nondistended. No masses, hepatosplenomegaly or hernias noted. Normal bowel sounds. Rectal:  Normal exam.Her rectal tone appears good and I cannot appreciate masses, tenderness, or rectocele. There is no stool present for guaiac testing. I cannot appreciate fissures or fistulae. Extremities:  No clubbing, cyanosis, edema or deformities noted. Neurologic:  Alert and  oriented x4;  grossly normal neurologically. Psych:  Alert and cooperative. Normal mood and affect.   Impression & Recommendations:  Problem # 1:  CONSTIPATION (ICD-564.00) Assessment Deteriorated Followup colonoscopy indicated and we'll hold Plavix 5 days before this procedure. I placed her on daily Benefiber and probiotic therapy, will check repeat labs, and continue high-fiber diet as tolerated.  Problem # 2:  ADENOCARCINOMA, COLON, FAMILY HX (ICD-V16.0) Assessment: Unchanged  Problem # 3:  DIVERTICULOSIS, COLON (ICD-562.10) Assessment: Unchanged  Problem # 4:  CEREBROVASCULAR ACCIDENT, HX OF (ICD-V12.50) Assessment: Unchanged Continue aspirin during her colonoscopy prep time but hold Plavix 5 days before the procedure as per standard protocol. He also will continue her Lotrel 10 days 40 daily.  Patient Instructions: 1)  Copy sent to : Dr. Oliver Barre 2)  Please continue current medications.  3)  High Fiber, Low Fat  Healthy Eating Plan brochure given.   4)  Constipation and Hemorrhoids brochure given.  5)  Colonoscopy and Flexible Sigmoidoscopy brochure given.  6)  Diverticular Disease brochure given. 7)  [facility] Colon Health tablets twice a day and daily Benefiber. 8)  On Plavix 5 days before colonoscopy but continue salicylate therapy. 9)  Labs ordered.   Appended Document: Orders Update Clinical Lists Changes  Medications: Added new medication of MOVIPREP 100 GM  SOLR (PEG-KCL-NACL-NASULF-NA ASC-C) As per prep instructions. - Signed Added new medication of PHILLIPS COLON HEALTH  CAPS (PROBIOTIC PRODUCT) Take 1 capsule by mouth two times a day Added new medication of BENEFIBER  POWD (WHEAT DEXTRIN) take 1 dose disolved in liquid once daily Rx of MOVIPREP 100 GM  SOLR (PEG-KCL-NACL-NASULF-NA ASC-C) As per prep instructions.;  #1 x 0;  Signed;  Entered by: Francee Piccolo CMA (AAMA);  Authorized by: Mardella Layman MD [facility];  Method used: Electronically to [facility]  [address] [id number] [address], [place], [place]  [address], Ph: [phone] or [phone], Fax: [phone] Orders: Added new Test order of TLB-CBC Platelet - w/Differential (85025-CBCD) - Signed Added new Test order of TLB-B12 + Folate Pnl (82746_82607-B12/FOL) - Signed Added new Test order of TLB-BMP (Basic Metabolic Panel-BMET) (80048-METABOL) - Signed Added new Test order of TLB-Hepatic/Liver Function Pnl (80076-HEPATIC) - Signed Added new Test order of TLB-TSH (Thyroid Stimulating Hormone) (84443-TSH) - Signed Added new Test order of TLB-Ferritin (82728-FER) - Signed Added new Test order of TLB-IBC Pnl (Iron/FE;Transferrin) (83550-IBC) - Signed Added  new Test order of TLB-Sedimentation Rate (ESR) (85652-ESR) - Signed Added new Test order of T-Sprue Panel (Celiac Disease Aby Eval) (83516x3/[address]) - Signed Added new Test order of Colonoscopy (Colon) - Signed    Prescriptions: MOVIPREP 100 GM  SOLR (PEG-KCL-NACL-NASULF-NA ASC-C) As per prep  instructions.  #1 x 0   Entered by:   Francee Piccolo CMA (AAMA)   Authorized by:   Mardella Layman MD [facility]   Signed by:   Francee Piccolo CMA (AAMA) on 11/10/2009   Method used:   Electronically to        [facility]  [address] [id number] ([facility]ail)       [address]       [place], [place]  [address]       Ph: [phone] or [phone]       Fax: [phone]   RxID:   [id number]

## 2010-05-05 NOTE — Procedures (Signed)
Summary: EGD   EGD  Procedure date:  09/04/2005  Findings:      Location: [facility]    EGD  Procedure date:  09/04/2005  Findings:      Location: [facility]   Patient Name: Tracy [name], Tracy [name]. MRN:  Procedure Procedures: Panendoscopy (EGD) CPT: 43235.    with biopsy(s)/brushing(s). CPT: D1846139.  Personnel: Endoscopist: Vania Rea. Jarold Motto, MD.  Exam Location: Exam performed in Outpatient Clinic. Outpatient  Patient Consent: Procedure, Alternatives, Risks and Benefits discussed, consent obtained, from patient. Consent was obtained by the RN.  Indications Symptoms: Reflux symptoms for 6-10 yrs,  History  Current Medications: Patient is on an anticoagulant. Patient is not currently taking Coumadin.  Medical/Surgical History: Hypertension, TIAs,  Pre-Exam Physical: Performed Oct 01, 2005  Cardio-pulmonary exam, Abdominal exam, Extremity exam, Mental status exam WNL.  Comments: Pt. history reviewed/updated, physical exam performed prior to initiation of sedation?yes Exam Exam Info: Maximum depth of insertion Duodenum, intended Duodenum. Patient position: on left side. Duration of exam: 10 minutes. Vocal cords visualized. Gastric retroflexion performed. Images taken. ASA Classification: II. Tolerance: excellent.  Sedation Meds: Patient assessed and found to be appropriate for moderate (conscious) sedation. Cetacaine Spray 2 sprays given aerosolized. Versed 2 mg. given IV. Fentanyl 25 mcg. given IV.  Monitoring: BP and pulse monitoring done. Oximetry used. Supplemental O2 given at 2 Liters.  Instrument(s): GIF 160. Serial [id number].   Findings - HIATAL HERNIA: Prolapsing, 5 cms. in length. ICD9: Hernia, Hiatal: 553.3. - Normal: Proximal Esophagus to Distal Esophagus. Not Seen: Tumor. Barrett's esophagus. Esophageal inflammation. Mucosal abnormality. Foreign body. Varices.  - Normal: Antrum to Duodenal 2nd Portion. Ulcer. Mucosal  abnormality. AVM's. Foreign body.  - MUCOSAL ABNORMALITY: Body to Antrum. Nodularity present. Erythematous mucosa. Red spots present. Granular mucosa. Biopsy/Mucosal Abn taken. RUT done, results pending. ICD9: Gastritis, Unspecified: 535.50. Comment: R/O  H.pylori.   Assessment  Diagnoses: 553.3: Hernia, Hiatal.  535.50: Gastritis, Unspecified.   Comments: Chronic GERD...no Barrett's mucosa noted... Events  Unplanned Intervention: No unplanned interventions were required.  Plans Medication(s): Await pathology. Continue current medications.  Patient Education: Patient given standard instructions for: Reflux.  Disposition: After procedure patient sent to recovery. After recovery patient sent home.  Scheduling: Await pathology to schedule patient. Follow-up prn.    cc: Corwin Levins, MD   This report was created from the original endoscopy report, which was reviewed and signed by the above listed endoscopist.

## 2010-05-05 NOTE — Miscellaneous (Signed)
Summary: Orders Update  Clinical Lists Changes  Orders: Added new Test order of Arterial Duplex Lower Extremity (Arterial Duplex Low) - Signed 

## 2010-05-05 NOTE — Progress Notes (Signed)
Summary: Questions about meds.   Phone Note Call from Patient Call back at Home Phone [phone]   Caller: Patient Call For: Dr. Jarold Motto Summary of Call: Was given [employer] [facility] Tabs and unsure when she is suppose to start taking them Initial call taken by: Karna Christmas,  November 14, 2009 8:37 AM  Follow-up for Phone Call        Pt istructed to start [facility] now,  she needs to stop it 5 days prior to her proc.  Pt states she understands. Follow-up by: Ashok Cordia RN,  November 14, 2009 9:10 AM

## 2010-05-05 NOTE — Assessment & Plan Note (Signed)
Summary: FOOT PROBLEM/NWS   Vital Signs:  Patient profile:   77 year old female Height:      60 inches Weight:      117 pounds BMI:     22.93 O2 Sat:      98 % on Room air Temp:     97.8 degrees F oral Pulse rate:   50 / minute BP sitting:   138 / 54  (left arm) Cuff size:   regular  Vitals Entered By: Zella Ball Ewing CMA Duncan Dull) (February 19, 2010 2:52 PM)  O2 Flow:  Room air CC: Left foot toe pain/RE   Primary Care Provider:  Oliver Barre, MD   CC:  Left foot toe pain/RE.  History of Present Illness: here with 1 wk discomfort to the LLE primarily to the left foot, mostly 2nd toe;  mild, but constant persistnet, somewhat worse at night but no clearly with ambulation, but also with a coolness to the leg and dusky color to the 2nd toe, advil helps at night so she can sleep, has good compliacne with the ASA, no prior hx of PVD or smoking, but has significant hx of hyperlipidemia and hx of CVA.  Pt denies CP, worsening sob, doe, wheezing, orthopnea, pnd, worsening LE edema, palps, dizziness or syncope  Pt denies new neuro symptoms such as headache, facial or extremity weakness Pt denies polydipsia, polyuria, or low sugar symptoms such as shakiness improved with eating.  Overall good compliance with meds, trying to follow low chol, DM diet, wt stable, little excercise however  No trauma or hx of gout, and no foot swelling.  Pt here as one of her friends thought she might have gout.  Problems Prior to Update: 1)  Foot Pain, Left  (ICD-729.5) 2)  Adenocarcinoma, Colon, Family Hx  (ICD-V16.0) 3)  Pre-operative Cardiovascular Examination  (ICD-V72.81) 4)  Neck Mass  (ICD-784.2) 5)  Constipation  (ICD-564.00) 6)  Diabetes Mellitus, Type II  (ICD-250.00) 7)  Diverticulosis, Colon  (ICD-562.10) 8)  Colonic Polyps, Hx of  (ICD-V12.72) 9)  Shoulder Pain, Right  (ICD-719.41) 10)  Preventive Health Care  (ICD-V70.0) 11)  Osteopenia  (ICD-733.90) 12)  Cerebrovascular Accident, Hx of   (ICD-V12.50) 13)  Gerd  (ICD-530.81) 14)  Hypertension  (ICD-401.9) 15)  Hyperlipidemia  (ICD-272.4)  Medications Prior to Update: 1)  Caltrate 600 1500 Mg  Tabs (Calcium Carbonate) .... By Mouth Two Times A Day 2)  Plavix 75 Mg  Tabs (Clopidogrel Bisulfate) .... Once Daily 3)  Multivitamins  Caps (Multiple Vitamin) .... One Capsule By Mouth Once Daily 4)  Fenofibrate Micronized 134 Mg Caps (Fenofibrate Micronized) .Marland Kitchen.. 1 By Mouth Once Daily 5)  Actonel 35 Mg  Tabs (Risedronate Sodium) .Marland Kitchen.. 1 Weekly By Mouth 6)  Lotrel 10-40 Mg Caps (Amlodipine Besy-Benazepril Hcl) .Marland Kitchen.. 1 By Mouth Once Daily 7)  Aspirin 81 Mg  Tabs (Aspirin) .... One Tablet By Mouth Once Daily 8)  Phillips Colon Health  Caps (Probiotic Product) .... Take 1 Capsule By Mouth Two Times A Day 9)  Benefiber  Powd (Wheat Dextrin) .... Take 1 Dose Disolved in Liquid Once Daily  Current Medications (verified): 1)  Caltrate 600 1500 Mg  Tabs (Calcium Carbonate) .... By Mouth Two Times A Day 2)  Plavix 75 Mg  Tabs (Clopidogrel Bisulfate) .... Once Daily 3)  Multivitamins  Caps (Multiple Vitamin) .... One Capsule By Mouth Once Daily 4)  Fenofibrate Micronized 134 Mg Caps (Fenofibrate Micronized) .Marland Kitchen.. 1 By Mouth Once Daily 5)  Actonel 35  Mg  Tabs (Risedronate Sodium) .Marland Kitchen.. 1 Weekly By Mouth 6)  Lotrel 10-40 Mg Caps (Amlodipine Besy-Benazepril Hcl) .Marland Kitchen.. 1 By Mouth Once Daily 7)  Aspirin 81 Mg  Tabs (Aspirin) .... One Tablet By Mouth Once Daily 8)  Phillips Colon Health  Caps (Probiotic Product) .... Take 1 Capsule By Mouth Two Times A Day 9)  Benefiber  Powd (Wheat Dextrin) .... Take 1 Dose Disolved in Liquid Once Daily 10)  Pentoxifylline Cr 400 Mg Cr-Tabs (Pentoxifylline) .Marland Kitchen.. 1po Once Daily  Allergies (verified): No Known Drug Allergies  Past History:  Past Medical History: Last updated: 01/30/2008 Hyperlipidemia Hypertension GERD Cerebrovascular accident, hx of- 2002 Osteopenia Colonic polyps, hx of - adenomatous  2007 Diverticulosis, colon Diabetes mellitus, type II - diet  Past Surgical History: Last updated: 01/28/2009 s/p CNS vascular stent per neurosurgeon Tubal ligation s/p right shoulder surgury 2010  Social History: Last updated: 11/10/2009 Retired-[employer] Widowed 5 childern Patient is a former smoker.  Alcohol Use - no Daily Caffeine Use: 4 daily  Illicit Drug Use - no  Risk Factors: Smoking Status: quit (11/10/2009)  Review of Systems       all otherwise negative per pt -    Physical Exam  General:  alert and underweight appearing.   Head:  normocephalic and atraumatic.   Eyes:  vision grossly intact, pupils equal, and pupils round.   Ears:  R ear normal and L ear normal.   Nose:  no external deformity and no nasal discharge.   Mouth:  no gingival abnormalities and pharynx pink and moist.   Neck:  supple and no masses.   Lungs:  normal respiratory effort and normal breath sounds.   Heart:  normal rate and regular rhythm.   Msk:  no acute joint tenderness and no joint swelling.   Pulses:  trace LLE, 1+ RLE dorsalis pedis Extremities:  no edema, no erythema , LLE mild cool from mid calf distal , 2nd toe somewhat dusky color but nontender, no swelling, erythema, ulcer or gangrene   Impression & Recommendations:  Problem # 1:  FOOT PAIN, LEFT (ICD-729.5)  ? vascular disease/insufficiency - quite likely with symptoms and exam, cant r/o vasculitis or embolic  but more likely atherosclerotic - for LE art dopplers asap, tylenol as needed pain, may need vascular surgury consult  Orders: Misc. Referral (Misc. Ref)  Problem # 2:  DIABETES MELLITUS, TYPE II (ICD-250.00)  Her updated medication list for this problem includes:    Lotrel 10-40 Mg Caps (Amlodipine besy-benazepril hcl) .Marland Kitchen... 1 by mouth once daily    Aspirin 81 Mg Tabs (Aspirin) ..... One tablet by mouth once daily  Labs Reviewed: Creat: 1.1 (11/10/2009)    Reviewed HgBA1c results: 6.0 (01/28/2009)  6.3  (07/30/2008) stable overall by hx and exam, ok to continue meds/tx as is - to cont diet only  Problem # 3:  HYPERTENSION (ICD-401.9)  Her updated medication list for this problem includes:    Lotrel 10-40 Mg Caps (Amlodipine besy-benazepril hcl) .Marland Kitchen... 1 by mouth once daily  BP today: 138/54 Prior BP: 128/60 (11/10/2009)  Labs Reviewed: K+: 4.8 (11/10/2009) Creat: : 1.1 (11/10/2009)   Chol: 149 (01/28/2009)   HDL: 43.90 (01/28/2009)   LDL: 81 (01/28/2009)   TG: 121.0 (01/28/2009) stable overall by hx and exam, ok to continue meds/tx as is   Problem # 4:  HYPERLIPIDEMIA (ICD-272.4)  Her updated medication list for this problem includes:    Fenofibrate Micronized 134 Mg Caps (Fenofibrate micronized) .Marland Kitchen... 1 by mouth  once daily  Labs Reviewed: SGOT: 18 (11/10/2009)   SGPT: 12 (11/10/2009)   HDL:43.90 (01/28/2009), 37.80 (07/30/2008)  LDL:81 (01/28/2009), 81 (07/30/2008)  Chol:149 (01/28/2009), 144 (07/30/2008)  Trig:121.0 (01/28/2009), 125.0 (07/30/2008) stable overall by hx and exam, ok to continue meds/tx as is   Complete Medication List: 1)  Caltrate 600 1500 Mg Tabs (Calcium carbonate) .... By mouth two times a day 2)  Plavix 75 Mg Tabs (Clopidogrel bisulfate) .... Once daily 3)  Multivitamins Caps (Multiple vitamin) .... One capsule by mouth once daily 4)  Fenofibrate Micronized 134 Mg Caps (Fenofibrate micronized) .Marland Kitchen.. 1 by mouth once daily 5)  Actonel 35 Mg Tabs (Risedronate sodium) .Marland KitchenMarland KitchenMarland Kitchen 1 weekly by mouth 6)  Lotrel 10-40 Mg Caps (Amlodipine besy-benazepril hcl) .Marland Kitchen.. 1 by mouth once daily 7)  Aspirin 81 Mg Tabs (Aspirin) .... One tablet by mouth once daily 8)  Phillips Colon Health Caps (Probiotic product) .... Take 1 capsule by mouth two times a day 9)  Benefiber Powd (Wheat dextrin) .... Take 1 dose disolved in liquid once daily 10)  Pentoxifylline Cr 400 Mg Cr-tabs (Pentoxifylline) .Marland Kitchen.. 1po once daily  Patient Instructions: 1)  Please take all new medications as  prescribed - the generic for trental (for circulation) 2)  You will be contacted about the referral(s) to: leg circulation test 3)  Continue all previous medications as before this visit  4)  Please schedule a follow-up appointment in 5 months for CPX with labs and: 5)  HbgA1C prior to visit, ICD-9: 250.02 6)  Urine Microalbumin prior to visit, ICD-9: Prescriptions: PENTOXIFYLLINE CR 400 MG CR-TABS (PENTOXIFYLLINE) 1po once daily  #30 x 11   Entered and Authorized by:   Corwin Levins MD   Signed by:   Corwin Levins MD on 02/19/2010   Method used:   Print then Give to Patient   RxID:   [id number]    Orders Added: 1)  Misc. Referral [Misc. Ref] 2)  Est. Patient Level IV [56213]

## 2010-05-05 NOTE — Progress Notes (Signed)
Summary: referral  Phone Note Call from Patient Call back at Home Phone [phone]   Caller: Patient Summary of Call: Pt called requesting referral to Vascular and Vein specialist. Initial call taken by: Margaret Pyle, CMA,  February 23, 2010 11:00 AM  Follow-up for Phone Call        yes - done to Dr Hart Rochester - for asap  Follow-up by: Corwin Levins MD,  February 23, 2010 1:00 PM  Additional Follow-up for Phone Call Additional follow up Details #1::        Pt informed, aware that [facility] will call with appt info Additional Follow-up by: Margaret Pyle, CMA,  February 23, 2010 1:59 PM  New Problems: PVD WITH CLAUDICATION (ICD-443.89)   New Problems: PVD WITH CLAUDICATION (ICD-443.89)

## 2010-05-05 NOTE — Assessment & Plan Note (Signed)
Summary: eph      Allergies Added: NKDA  Visit Type:  Follow-up Referring Provider:  na Primary Provider:  Oliver Barre, MD   CC:  Post-hospital.  History of Present Illness: Tracy [name] is 77 years old and return for a followup visit following her recent peripheral vascular procedure. She was recently seen by Dr. Hart Rochester and found to have greater than 90% stenosis in the left carotid as well as severe peripheral vascular disease. She underwent peripheral angiography by Dr. Myra Gianotti and he put stents in both iliac vessels. While she was in the holding area she had periods of long pauses and bradycardia. She had rates in the high 30s and pauses of 3 seconds.  We obtain an event monitor after discharge and this showed similar bradycardia in the 30s with some pauses up to 3 seconds. She has had no symptoms of lightheadedness or dizziness associated with this.  She also has an abnormal ECG with inferior T-wave inversions. She has had no chest pain. She needs clearance for possible carotid surgery.  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  She said she had an unsuccessful attempt at percutaneous treatment of her left carotid stenosis several years ago but she doesn't have any details.  Current Medications (verified): 1)  Caltrate 600 1500 Mg  Tabs (Calcium Carbonate) .... By Mouth Two Times A Day 2)  Plavix 75 Mg  Tabs (Clopidogrel Bisulfate) .... Once Daily 3)  Multivitamins  Caps (Multiple Vitamin) .... One Capsule By Mouth Once Daily 4)  Fenofibrate Micronized 134 Mg Caps (Fenofibrate Micronized) .Marland Kitchen.. 1 By Mouth Once Daily 5)  Actonel 35 Mg  Tabs (Risedronate Sodium) .Marland Kitchen.. 1 Weekly By Mouth 6)  Lotrel 10-40 Mg Caps (Amlodipine Besy-Benazepril Hcl) .Marland Kitchen.. 1 By Mouth Once Daily 7)  Aspirin 81 Mg  Tabs (Aspirin) .... One Tablet By Mouth Once Daily 8)  [employer]illips Colon Health  Caps (Probiotic Product) .... Take 1 Capsule By  Mouth Two Times A Day 9)  Benefiber  Powd (Wheat Dextrin) .... Take 1 Dose Disolved in Liquid Once Daily 10)  Pentoxifylline Cr 400 Mg Cr-Tabs (Pentoxifylline) .Marland Kitchen.. 1po Once Daily  Allergies (verified): No Known Drug Allergies  Past History:  Past Medical History: Reviewed history from 03/09/2010 and no changes required. Hyperlipidemia Hypertension GERD Cerebrovascular accident, hx of- 2002 Osteopenia Colonic polyps, hx of - adenomatous 2007 Diverticulosis, colon Diabetes mellitus, type II - diet Peripheral vascular disease, recently diagnosed with carotid.       a.     Left internal carotid of greater than 90% with 50-60% right        internal carotid by Doppler on November 2011.       b.     Left ABI 0.11, and right ABI 0.43.  Bradycardia as noted on the prior office visit, her heart rates in       the 50s, no prior EKGs for comparison.          Review of Systems       ROS is negative except as outlined in HPI.   Vital Signs:  Patient profile:   77 year old female Height:      60 inches Weight:      117.75 pounds BMI:     23.08 Pulse rate:   50 / minute Pulse rhythm:   regular Resp:     18 per minute BP sitting:   146 /  56  (left arm) Cuff size:   regular  Vitals Entered By: Vikki Ports (March 10, 2010 2:50 PM)  Physical Exam  Additional Exam:  Gen. Well-nourished, in no distress   Neck: No JVD, thyroid not enlarged, bilateral carotid bruits Lungs: No tachypnea, clear without rales, rhonchi or wheezes Cardiovascular: Rhythm regular, PMI not displaced,  heart sounds  normal, no murmurs or gallops, no peripheral edema, decreased pedal pulses bilaterally Abdomen: BS normal, abdomen soft and non-tender without masses or organomegaly, no hepatosplenomegaly. MS: No deformities, no cyanosis or clubbing   Neuro:  No focal sns   Skin:  no lesions    Impression & Recommendations:  Problem # 1:  SICK SINUS SYNDROME (ICD-427.81) She has sinus pauses of up to  3 seconds and bradycardia with heart rates in the high 30s. She has had no symptoms of lightheadedness or dizziness. I am undecided whether she should have a pacemaker. I have suggested consultation with our electrophysiologist to discuss this further. She would prefer not to do this and is against having a pacemaker at this point. Her updated medication list for this problem includes:    Plavix 75 Mg Tabs (Clopidogrel bisulfate) ..... Once daily    Lotrel 10-40 Mg Caps (Amlodipine besy-benazepril hcl) .Marland Kitchen... 1 by mouth once daily    Aspirin 81 Mg Tabs (Aspirin) ..... One tablet by mouth once daily  Orders: EKG w/ Interpretation (93000) Nuclear Stress Test (Nuc Stress Test)  Problem # 2:  PRE-OPERATIVE CARDIOVASCULAR EXAMINATION (ICD-V72.81) She has a tight left carotid stenosis and this will likely need surgery. We need to do a preoperative assessment of her wrist. She's had no recent cardiac symptoms but she does have an abnormal ECG and she does have diffuse vascular disease. We will screen her with a Myoview scan.  We will decide about further cardiac followup depending upon the results of her Myoview scan and decision regarding carotid surgery.  Her updated medication list for this problem includes:    Plavix 75 Mg Tabs (Clopidogrel bisulfate) ..... Once daily    Lotrel 10-40 Mg Caps (Amlodipine besy-benazepril hcl) .Marland Kitchen... 1 by mouth once daily    Aspirin 81 Mg Tabs (Aspirin) ..... One tablet by mouth once daily  Orders: Nuclear Stress Test (Nuc Stress Test)  Problem # 3:  PVD WITH CLAUDICATION (ICD-443.89) She has PVD and had recent stents of both her iliacs. This problem appears stable at present.  Problem # 4:  CAROTID STENOSIS (ICD-433.10) She has bilateral carotid stenosis was greater than 90% stenosis on the left. She is scheduled to see Dr. Hart Rochester next week to decide about surgery. Her updated medication list for this problem includes:    Plavix 75 Mg Tabs (Clopidogrel  bisulfate) ..... Once daily    Aspirin 81 Mg Tabs (Aspirin) ..... One tablet by mouth once daily  Patient Instructions: 1)  Your physician has requested that you have a Lexiscan myoview.  For further information please visit [email or web address]  Please follow instruction sheet, as given. 2)  We will call you with the results of your test.

## 2010-05-05 NOTE — Letter (Signed)
Summary: [facility] Instructions  [facility]  [address] [place], [place] [address]   Phone: [phone]  Fax: [phone]       Tracy [name]    September 08, 1926    MRN: [id number]      Procedure Day Dorna Bloom: [name], 11/28/09     Arrival Time: 1:00 PM      Procedure Time: 2:00 PM    Location of Procedure:                    _X_  [facility] (4th Floor)  PREPARATION FOR COLONOSCOPY WITH MOVIPREP   Starting 5 days prior to your procedure 11/23/09 do not eat nuts, seeds, popcorn, corn, beans, peas,  salads, or any raw vegetables.  Do not take any fiber supplements (e.g. Metamucil, Citrucel, and Benefiber).  THE DAY BEFORE YOUR PROCEDURE         THURSDAY, 11/27/09  1.  Drink clear liquids the entire day-NO SOLID FOOD  2.  Do not drink anything colored red or purple.  Avoid juices with pulp.  No orange juice.  3.  Drink at least 64 oz. (8 glasses) of fluid/clear liquids during the day to prevent dehydration and help the prep work efficiently.  CLEAR LIQUIDS INCLUDE: Water Jello Ice Popsicles Tea (sugar ok, no milk/cream) Powdered fruit flavored drinks Coffee (sugar ok, no milk/cream) Gatorade Juice: apple, white grape, white cranberry  Lemonade Clear bullion, consomm, broth Carbonated beverages (any kind) Strained chicken noodle soup Hard Candy                           4.  In the morning, mix first dose of MoviPrep solution:    Empty 1 Pouch A and 1 Pouch B into the disposable container    Add lukewarm drinking water to the top line of the container. Mix to dissolve    Refrigerate (mixed solution should be used within 24 hrs)  5.  Begin drinking the prep at 5:00 p.m. The MoviPrep container is divided by 4 marks.   Every 15 minutes drink the solution down to the next mark (approximately 8 oz) until the full liter is complete.   6.  Follow completed prep with 16 oz of clear liquid of your choice (Nothing red or purple).  Continue to drink clear  liquids until bedtime.  7.  Before going to bed, mix second dose of MoviPrep solution:    Empty 1 Pouch A and 1 Pouch B into the disposable container    Add lukewarm drinking water to the top line of the container. Mix to dissolve    Refrigerate  THE DAY OF YOUR PROCEDURE      FRIDAY, 11/28/09  Beginning at 9:00a.m. (5 hours before procedure):         1. Every 15 minutes, drink the solution down to the next mark (approx 8 oz) until the full liter is complete.  2. Follow completed prep with 16 oz. of clear liquid of your choice.    3. You may drink clear liquids until 12:00 NOON (2 HOURS BEFORE PROCEDURE).  MEDICATION INSTRUCTIONS  Unless otherwise instructed, you should take regular prescription medications with a small sip of water   as early as possible the morning of your procedure.  Stop taking Plavix or Aggrenox on  11/23/09  (5 days before procedure). per Dr. Jarold Motto         OTHER INSTRUCTIONS  You will need a  responsible adult at least 75 years of age to accompany you and drive you home.   This person must remain in the waiting room during your procedure.  Wear loose fitting clothing that is easily removed.  Leave jewelry and other valuables at home.  However, you may wish to bring a book to read or  an iPod/MP3 player to listen to music as you wait for your procedure to start.  Remove all body piercing jewelry and leave at home.  Total time from sign-in until discharge is approximately 2-3 hours.  You should go home directly after your procedure and rest.  You can resume normal activities the  day after your procedure.  The day of your procedure you should not:   Drive   Make legal decisions   Operate machinery   Drink alcohol   Return to work  You will receive specific instructions about eating, activities and medications before you leave.  The above instructions have been reviewed and explained to me by   Francee Piccolo, CMA (AAMA)    I  fully understand and can verbalize these instructions _____________________________ Date _________

## 2010-05-05 NOTE — Procedures (Signed)
Summary: Colon   Colonoscopy  Procedure date:  10/01/2005  Findings:      Location:  [facility].   Patient Name: Tracy [name], Tracy [name] MRN:  Procedure Procedures: Colonoscopy CPT: [id number].  Personnel: Endoscopist: Vania Rea. Jarold Motto, MD.  Exam Location: Exam performed in Outpatient Clinic. Outpatient  Patient Consent: Procedure, Alternatives, Risks and Benefits discussed, consent obtained, from patient. Consent was obtained by the RN.  Indications  Average Risk Screening Routine.  History  Current Medications: Patient is on an anticoagulant. Patient is not currently taking Coumadin.  Medical/ Surgical History: Hypertension, TIAs,  Pre-Exam Physical: Cardio-pulmonary exam, Rectal exam, Abdominal exam, Extremity exam, Mental status exam WNL.  Comments: Pt. history reviewed/updated, physical exam performed prior to initiation of sedation? Exam Exam: Extent of exam reached: Cecum, extent intended: Cecum.  The cecum was identified by appendiceal orifice and IC valve. Patient position: on left side. Time for Withdrawl: 06:25. Colon retroflexion performed. Images taken. ASA Classification: II. Tolerance: excellent.  Monitoring: Pulse and BP monitoring, Oximetry used. Supplemental O2 given. at 2 Liters.  Colon Prep Used Golytely for colon prep.  Sedation Meds: Patient assessed and found to be appropriate for moderate (conscious) sedation. Fentanyl 50 mcg. given IV. Versed 5 mg. given IV.  Instrument(s): CF 140L. Serial [id number].  Findings - POLYP: Hepatic Flexure, diminutive, Procedure:  biopsy without cautery, removed, retrieved, Polyp sent to pathology. ICD9: Colon Polyps: 211.3.  - DIVERTICULOSIS: Descending Colon to Sigmoid Colon. Not bleeding. ICD9: Diverticulosis, Colon: 562.10. Comments: EXTENSIVE COMPLEX TICS Noted.  - NORMAL EXAM: Cecum to Rectum. Not Seen: AVM's. Colitis. Tumors. Melanosis. Crohn's.  - POLYP: Sigmoid Colon, diminutive,  Procedure:  biopsy without cautery, removed, retrieved, Polyp sent to pathology.  - HEMORRHOIDS: Internal. Size: Medium. Not bleeding. Not thrombosed. ICD9: Hemorrhoids, Internal: 455.0.   Assessment  Diagnoses: 211.3: Colon Polyps.  562.10: Diverticulosis, Colon.  455.0: Hemorrhoids, Internal.   Events  Unplanned Interventions: No intervention was required.  Plans Medication Plan: Await pathology. Referring provider to order medications.  Patient Education: Patient given standard instructions for: Polyps. Diverticulosis.  Disposition: After procedure patient sent to recovery. After recovery patient sent home.  Scheduling/Referral: Follow-Up prn. Await pathology to schedule patient.    cc: Corwin Levins, MD    This report was created from the original endoscopy report, which was reviewed and signed by the above listed endoscopist.

## 2010-05-05 NOTE — Letter (Signed)
Summary: Patient Notice- Colon Biospy Results  [facility]  [address] [place], [place] [address]   Phone: [phone]  Fax: [phone]        December 03, 2009 MRN: [id number]    [facility] [address] [place], [place]  [address]    Dear Ms. Souders,  I am pleased to inform you that the biopsies taken during your recent colonoscopy did not show any evidence of cancer upon pathologic examination.  Additional information/recommendations:  __No further action is needed at this time.  Please follow-up with      your primary care physician for your other healthcare needs.  __Please call [phone] to schedule a return visit to review      your condition.  _X_Continue with the treatment plan as outlined on the day of your      exam.  __You should have a repeat colonoscopy examination for this problem           in _ years.  Please call us if you are having persistent problems or have questions about your condition that have not been fully answered at this time.  Sincerely,  Mardella Layman MD [facility]   This letter has been electronically signed by your physician.  Appended Document: Patient Notice- Colon Biospy Results letter mailed 9.2.11

## 2010-05-05 NOTE — Progress Notes (Signed)
Summary: Nuclear pre procedure  Phone Note Outgoing Call Call back at [facility] Phone [phone]   Call placed by: Rea College, CMA,  March 12, 2010 4:31 PM Call placed to: Patient Summary of Call: Reviewed information on Myoview Information Sheet (see scanned document for further details).  Thurston Hole spoke with patient.      Nuclear Med Background Indications for Stress Test: Evaluation for Ischemia, Surgical Clearance, Abnormal EKG  Indications Comments: 02/28/10 PVD with iliac stent x 3. Pending (L) CEA  History: Myocardial Perfusion Study  History Comments: '10 GNF:AOZHYQ, not gated.   Symptoms Comments: Asymptomatic bradycardia, HR 30's and 40's with 3-second pauses.   Nuclear Pre-Procedure Cardiac Risk Factors: Carotid Disease, Claudication, CVA, History of Smoking, Hypertension, Lipids, NIDDM, PVD Height (in): 60  Nuclear Med Study Referring MD:  J.John

## 2010-05-07 NOTE — Assessment & Plan Note (Addendum)
Summary: Cardiology Nuclear Testing  Nuclear Med Background Indications for Stress Test: Evaluation for Ischemia, Surgical Clearance, Abnormal EKG  Indications Comments: 02/28/10 PVD with iliac stent x 3. Pending (L) CEA  History: Myocardial Perfusion Study  History Comments: '10 JYN:WGNFAO, not gated.  Symptoms: Palpitations  Symptoms Comments: Asymptomatic bradycardia, HR 30's and 40's with 3-second pauses, SSS; needs PTVP, but refuses at this time.   Nuclear Pre-Procedure Cardiac Risk Factors: Carotid Disease, Claudication, CVA, History of Smoking, Hypertension, Lipids, NIDDM, PVD Caffeine/Decaff Intake: none NPO After: 6:00 AM Lungs: Clear.  O2 Sat 98% on RA. IV 0.9% NS with Angio Cath: 22g     IV Site: R Hand IV Started by: Cathlyn Parsons, RN Chest Size (in) 36     Cup Size DD     Height (in): 60 Weight (lb): 114 BMI: 22.34 Tech Comments: Trental held x 4 days.  Nuclear Med Study 1 or 2 day study:  1 day     Stress Test Type:  Eugenie Birks Reading MD:  Olga Millers, MD     Referring MD:  Charlies Constable, MD Resting Radionuclide:  Technetium 70m Tetrofosmin     Resting Radionuclide Dose:  11.0 mCi  Stress Radionuclide:  Technetium 51m Tetrofosmin     Stress Radionuclide Dose:  33.0 mCi   Stress Protocol   Lexiscan: 0.4 mg   Stress Test Technologist:  Rea College, CMA-N     Nuclear Technologist:  Doyne Keel, CNMT  Rest Procedure  Myocardial perfusion imaging was performed at rest 45 minutes following the intravenous administration of Technetium 28m Tetrofosmin.  Stress Procedure  The patient received IV Lexiscan 0.4 mg over 15-seconds.  Technetium 86m Tetrofosmin injected at 30-seconds.  There were no significant changes with infusion, other than occasional PVC's.  Quantitative spect images were obtained after a 45 minute delay.  QPS Raw Data Images:  Acquisition technically good; normal left ventricular size. Stress Images:  There is mild decreased uptake in the  apex. Rest Images:  Normal homogeneous uptake in all areas of the myocardium. Subtraction (SDS):  No evidence of ischemia (minimal reversibility at apex felt most likely not to be clinically significant). Transient Ischemic Dilatation:  0.97  (Normal <1.22)  Lung/Heart Ratio:  0.31  (Normal <0.45)  Quantitative Gated Spect Images QGS EDV:  74 ml QGS ESV:  17 ml QGS EF:  77 % QGS cine images:  Normal wall motion.   Overall Impression  Exercise Capacity: Lexiscan with no exercise. BP Response: Normal blood pressure response. Clinical Symptoms: No chest pain ECG Impression: No significant ST segment change suggestive of ischemia. Patient noted to have frequent junctional escape beats prior to lexiscan infusion. Overall Impression: Normal lexiscan nuclear study with no ischemia or infarction.  Appended Document: Cardiology Nuclear Testing The pt is aware of her results. Per Dr. Juanda Chance, she is ok for surgery with Dr. Hart Rochester, but will need precautions re: bradycardia- may need patches available for possible pacing.  Appended Document: [facility]     Nuclear Study  Procedure date:  03/16/2010  Findings:       Overall Impression   Exercise Capacity: Lexiscan with no exercise. BP Response: Normal blood pressure response. Clinical Symptoms: No chest pain ECG Impression: No significant ST segment change suggestive of ischemia. Patient noted to have frequent junctional escape beats prior to lexiscan infusion. Overall Impression: Normal lexiscan nuclear study with no ischemia or infarction.    Signed by Ferman Hamming, MD, [facility] on 03/16/2010 at 5:37 PM

## 2010-05-07 NOTE — Letter (Signed)
Summary: [facility]  [facility]   Imported By: Sherian Rein 03/24/2010 13:24:06  _____________________________________________________________________  External Attachment:    Type:   Image     Comment:   External Document

## 2010-05-07 NOTE — Letter (Signed)
Summary: Clearance Letter  [facility], Main Office  [address]   [place], [place] [address]   Phone: [phone]  Fax: [phone]    March 20, 2010  Re:     Tracy [name] Address:   [address]     [place], [place]  [address] DOB:     07/01/1926 MRN:     [id number]   Dear Hart Rochester,  Ms. Jurewicz is at acceptable cardiovascular risk for surgery. Precautions will need to be taken re: bradycardia during the peri-operative period. You may need to have patches available for possible pacing during surgery. If you have any questions, please feel free to contact our office.           Sincerely,   Charlies Constable, MD Sherri Rad, RN, BSN

## 2010-05-07 NOTE — Progress Notes (Signed)
Summary: Surgical clearance  ---- Converted from flag ---- ---- 03/19/2010 9:26 PM, Lenoria Farrier, MD, [facility] wrote: We can clear for surgery but need precautions re bradycardia in the peri-operative period.  May need patches for possible pacing. BB ------------------------------  Phone Note Outgoing Call   Call placed by: Sherri Rad, RN, BSN,  March 20, 2010 1:36 PM Call placed to: Patient Summary of Call: I have called the pt and made her aware of her myoview results and that she is ok for surgery per Dr. Juanda Chance. I  will send Dr. Hart Rochester a note of clearance. Initial call taken by: Sherri Rad, RN, BSN,  March 20, 2010 1:37 PM

## 2010-05-12 ENCOUNTER — Ambulatory Visit (INDEPENDENT_AMBULATORY_CARE_PROVIDER_SITE_OTHER): Payer: MEDICARE | Admitting: Vascular Surgery

## 2010-05-12 ENCOUNTER — Encounter: Payer: Self-pay | Admitting: Internal Medicine

## 2010-05-12 DIAGNOSIS — I6529 Occlusion and stenosis of unspecified carotid artery: Secondary | ICD-10-CM

## 2010-05-13 NOTE — Consult Note (Signed)
Summary: [facility]  [place]   Imported By: Sherian Rein 05/07/2010 08:25:19  _____________________________________________________________________  External Attachment:    Type:   Image     Comment:   External Document

## 2010-05-15 NOTE — Assessment & Plan Note (Signed)
OFFICE VISIT  Tracy [name], Tracy [name] DOB:  October 31, 1926                                       05/12/2010 EAVWU#:[id number]  The patient is an 77 year old female status post left carotid endarterectomy done by me April 30, 2010, for severe but asymptomatic left internal carotid stenosis.  She did very well at the time of her surgery but did have some significant bradycardia preoperatively and in the OR, which had been previously evaluated by Dr. Charlies Constable.  The patient had previously declined further electrophysiologic evaluation but now is considering a pacemaker and will be following up with Dr. Johney Frame in the next few weeks.  She denies any hemiparesis, aphasia, amaurosis fugax, diplopia, blurred vision or syncope since her discharge from the hospital.  She is swallowing well, has no hoarseness.  PHYSICAL EXAMINATION:  Blood pressure 140/68, heart rate is 51, respirations 20.  The left neck incision is healed nicely.  There is a small amount of edema beneath the incision which is resolving but no deep hematoma is as noted.  She has an excellent carotid pulse with no audible bruits and normal neurologic exam.  I reassured her regarding these findings and encouraged her to follow up with Dr. Johney Frame.  She will return to see [place] in 6 months for follow-up carotid duplex exam at that time unless she develops any neurologic symptoms in the interim.    Quita Skye Hart Rochester, M.D. Electronically Signed  JDL/MEDQ  D:  05/12/2010  T:  05/13/2010  Job:  [id number]  cc:   Corwin Levins, MD Dr. Johney Frame

## 2010-05-26 ENCOUNTER — Telehealth: Payer: Self-pay | Admitting: Internal Medicine

## 2010-05-28 ENCOUNTER — Encounter: Payer: Self-pay | Admitting: *Deleted

## 2010-05-29 ENCOUNTER — Encounter: Payer: Self-pay | Admitting: Internal Medicine

## 2010-05-29 ENCOUNTER — Ambulatory Visit (INDEPENDENT_AMBULATORY_CARE_PROVIDER_SITE_OTHER): Payer: Medicare Other | Admitting: Internal Medicine

## 2010-05-29 VITALS — BP 110/70 | HR 50 | Ht 60.0 in | Wt 114.0 lb

## 2010-05-29 DIAGNOSIS — I495 Sick sinus syndrome: Secondary | ICD-10-CM

## 2010-05-29 DIAGNOSIS — R001 Bradycardia, unspecified: Secondary | ICD-10-CM | POA: Insufficient documentation

## 2010-05-29 DIAGNOSIS — I10 Essential (primary) hypertension: Secondary | ICD-10-CM

## 2010-05-29 DIAGNOSIS — I498 Other specified cardiac arrhythmias: Secondary | ICD-10-CM

## 2010-05-29 NOTE — Progress Notes (Signed)
Subjective:      Patient ID: Tracy [name] is a 77 y.o. female.  Chief Complaint:  bradycardia HPI Tracy [name] presents today for EP follow-up.  She has been seen previously in cardiology consultation by Dr Juanda Chance and also by me during her recent hospitalization for L CEA.  She has longstanding asymptomatic bradycardia.  She reports doing very well since her recent hospitalization and surgery.  She has made full recovery and has returned to regular activity.  She is remarkably active.  She is on two bowling teams and bowls twice per week.  She also does all of her own cooking, cleaning, vacuuming, etc.  She denies chest pain, SOB, palpitations, dizziness, fatigue, nausea, presyncope, syncope, or any symptoms of bradycardia.  She is otherwise without complaint today.     PAST MEDICAL HISTORY:   1. Bradycardia, please see above for details.   2. Abnormal EKG with Lexiscan nuclear study on April 16, 2010, that       was normal showing no ischemia or infarct with an EF of 77%.   3. Peripheral vascular disease.       a.     Subsequent bilateral iliac stenting on February 27, 2010.       b.     Left internal carotid artery stenosis greater than 90% by        Doppler in November 2011 status post left BEA today.  She did have        right internal carotid stenosis up to 50% to 60%.   4. Osteopenia.   5. Diet-controlled diabetes.   6. Hypertension.   7. Hyperlipidemia.   8. TIA in 2003.   9. Tubular adenoma and colon polyps in 2007.   10.GERD by EGD.      PAST SURGICAL HISTORY:  Shoulder surgery, laser eye/cataract surgery,   and tubal ligation as well as above-mentioned procedures.   Current outpatient prescriptions:amLODipine-benazepril (LOTREL) 10-40 MG per capsule, Take 1 capsule by mouth daily.  , Disp: , Rfl: ;  aspirin 81 MG tablet, Take 81 mg by mouth daily.  , Disp: , Rfl: ;  Calcium Carbonate (CALTRATE 600) 1500 MG TABS, Take by mouth 2 (two) times daily.  , Disp: , Rfl: ;   clopidogrel (PLAVIX) 75 MG tablet, Take 75 mg by mouth daily.  , Disp: , Rfl:  fenofibrate micronized (LOFIBRA) 134 MG capsule, Take 134 mg by mouth 1 dose over 46 hours.  , Disp: , Rfl: ;  Multiple Vitamin (MULTIVITAMIN) capsule, Take 1 capsule by mouth daily.  , Disp: , Rfl: ;  pentoxifylline (TRENTAL) 400 MG CR tablet, Take 400 mg by mouth. Daily  , Disp: , Rfl: ;  Probiotic Product (PHILLIPS COLON HEALTH) CAPS, Take by mouth 2 (two) times daily.  , Disp: , Rfl:  risedronate (ACTONEL) 35 MG tablet, Take 35 mg by mouth every 7 (seven) days. with water on empty stomach, nothing by mouth or lie down for next 30 minutes. , Disp: , Rfl: ;  Wheat Dextrin (BENEFIBER) POWD, Take by mouth 1 dose over 46 hours.  , Disp: , Rfl: ;  DISCONTD: ACTONEL 35 MG tablet, Take 1 tablet by mouth Once a week., Disp: , Rfl:   ROS   All systems viewed and negative except as per HPI  Filed Vitals:   05/29/10 0912  BP: 110/70  Pulse: 50    Objective:    Physical Exam  Constitutional: She appears healthy. No distress.  HENT:  Nose:  Nose normal.  Mouth/Throat: Oropharynx is clear.  Eyes: Pupils are equal, round, and reactive to light.  Neck: Normal range of motion. Neck supple.       L CEA scar is well healed  Cardiovascular: Regular rhythm and normal heart sounds.  Bradycardia present.  PMI is not displaced.   No murmur heard. Pulmonary/Chest: Effort normal and breath sounds normal. She has no wheezes. She has no rales. She exhibits no tenderness.  Abdominal: Soft. Bowel sounds are normal. She exhibits no distension. There is no tenderness.  Musculoskeletal: Normal range of motion. She exhibits no edema and no tenderness.  Neurological: She is alert and oriented to person, place, and time.  Skin: Skin is warm and dry. No rash noted.   Lab Review:   Assessment:   1. Bradycardia  Electrocardiogram report  Tracy [name] presents today for EP follow-up regarding bradycardia.  She states that she has had a "slow  heart beat" for more than 20 years.  I spent an extended period of time today trying to find any symptoms of bradycardia and have found none.  The patient is amazingly active without any bradycardic symptoms.  Her EKG today reveals sinus bradycardia 50 bpm.  In the absence of symptoms, pacemaker implantation is not indicated at this time. I had a long discussion with the patient and her family.  They are clear that they would like to avoid PPM if possible. We will therefore follow for symptoms.  I have been very clear with the patient that she should contact my office should symptoms of bradycardia including fatigue, dizziness, presyncope, or syncope occur.  She should avoid chronotropic depressants longterm.  2.  Hypertension Controlled No changes today  Plan:   As above

## 2010-06-02 NOTE — Progress Notes (Signed)
Summary: question about a cath  Phone Note Call from Patient Call back at [phone]   Caller: Daughter/ Olegario Messier Summary of Call: Pt daughter calling regarding her mother having a cath Initial call taken by: Judie Grieve,  May 26, 2010 3:45 PM  Follow-up for Phone Call        spoke with daughter  Dr Johney Frame will see pt on Fri 05/29/10 at 8:45 daughter aware Dennis Bast, RN, BSN  May 27, 2010 11:39 AM

## 2010-06-02 NOTE — Assessment & Plan Note (Signed)
Summary: EPH/ to discuss PPM implant/kl   Visit Type:  Initial Consult Referring Provider:  na Primary Provider:  Oliver Barre, MD    History of Present Illness: See Scanned report from EPIC  Current Medications (verified): 1)  Actonel 35 Mg  Tabs (Risedronate Sodium) .Marland Kitchen.. 1 Weekly By Mouth 2)  Aspirin 81 Mg  Tabs (Aspirin) .... One Tablet By Mouth Once Daily 3)  Benefiber  Powd (Wheat Dextrin) .... Take 1 Dose Disolved in Liquid Once Daily 4)  Caltrate 600 1500 Mg  Tabs (Calcium Carbonate) .... By Mouth Two Times A Day 5)  Plavix 75 Mg  Tabs (Clopidogrel Bisulfate) .... Once Daily 6)  Fenofibrate Micronized 134 Mg Caps (Fenofibrate Micronized) .Marland Kitchen.. 1 By Mouth Once Daily 7)  Lotrel 10-40 Mg Caps (Amlodipine Besy-Benazepril Hcl) .Marland Kitchen.. 1 By Mouth Once Daily 8)  Multivitamins  Caps (Multiple Vitamin) .... One Capsule By Mouth Once Daily 9)  Pentoxifylline Cr 400 Mg Cr-Tabs (Pentoxifylline) .Marland Kitchen.. 1po Once Daily 10)  Phillips Colon Health  Caps (Probiotic Product) .... Take 1 Capsule By Mouth Two Times A Day  Allergies (verified): No Known Drug Allergies  Past History:  Past Medical History: Last updated: 03/09/2010 Hyperlipidemia Hypertension GERD Cerebrovascular accident, hx of- 2002 Osteopenia Colonic polyps, hx of - adenomatous 2007 Diverticulosis, colon Diabetes mellitus, type II - diet Peripheral vascular disease, recently diagnosed with carotid.       a.     Left internal carotid of greater than 90% with 50-60% right        internal carotid by Doppler on November 2011.       b.     Left ABI 0.11, and right ABI 0.43.  Bradycardia as noted on the prior office visit, her heart rates in       the 50s, no prior EKGs for comparison.          Vital Signs:  Patient profile:   77 year old female Height:      60 inches Weight:      114 pounds BMI:     22.34 Pulse rate:   50 / minute BP sitting:   110 / 70  (left arm)  Vitals Entered By: Laurance Flatten CMA (May 29, 2010 9:04 AM)   Impression & Recommendations: See scanned report from EPIC  Other Orders: EKG w/ Interpretation (93000)  Patient Instructions: 1)  Your physician recommends that you schedule a follow-up appointment in: 3 months. 2)  Your physician recommends that you continue on your current medications as directed. Please refer to the Current Medication list given to you today.

## 2010-06-02 NOTE — Progress Notes (Signed)
Summary: [place] Card Visit  [place] Card Visit   Imported By: Marylou Mccoy 05/29/2010 10:27:57  _____________________________________________________________________  External Attachment:    Type:   Image     Comment:   External Document

## 2010-06-11 NOTE — Letter (Signed)
Summary: [facility]  [facility]   Imported By: Sherian Rein 06/03/2010 07:27:57  _____________________________________________________________________  External Attachment:    Type:   Image     Comment:   External Document

## 2010-06-16 ENCOUNTER — Encounter (INDEPENDENT_AMBULATORY_CARE_PROVIDER_SITE_OTHER): Payer: Medicare Other

## 2010-06-16 DIAGNOSIS — I70229 Atherosclerosis of native arteries of extremities with rest pain, unspecified extremity: Secondary | ICD-10-CM

## 2010-06-16 DIAGNOSIS — Z48812 Encounter for surgical aftercare following surgery on the circulatory system: Secondary | ICD-10-CM

## 2010-06-16 LAB — CBC
HCT: 41.9 % (ref 36.0–46.0)
Hemoglobin: 13.6 g/dL (ref 12.0–15.0)
MCV: 90.3 fL (ref 78.0–100.0)
RDW: 14.9 % (ref 11.5–15.5)
WBC: 6.7 10*3/uL (ref 4.0–10.5)

## 2010-06-16 LAB — DIFFERENTIAL
Basophils Relative: 0 % (ref 0–1)
Lymphocytes Relative: 24 % (ref 12–46)
Lymphs Abs: 1.6 10*3/uL (ref 0.7–4.0)
Monocytes Absolute: 0.8 10*3/uL (ref 0.1–1.0)
Monocytes Relative: 12 % (ref 3–12)
Neutro Abs: 3.9 10*3/uL (ref 1.7–7.7)

## 2010-06-16 LAB — POCT I-STAT, CHEM 8
Calcium, Ion: 1.26 mmol/L (ref 1.12–1.32)
Glucose, Bld: 99 mg/dL (ref 70–99)
HCT: 42 % (ref 36.0–46.0)
Hemoglobin: 14.3 g/dL (ref 12.0–15.0)
TCO2: 27 mmol/L (ref 0–100)

## 2010-06-16 LAB — PROTIME-INR: INR: 1.06 (ref 0.00–1.49)

## 2010-06-16 LAB — TYPE AND SCREEN
ABO/RH(D): O NEG
Antibody Screen: NEGATIVE

## 2010-06-16 LAB — LACTIC ACID, PLASMA: Lactic Acid, Venous: 1.3 mmol/L (ref 0.5–2.2)

## 2010-06-16 LAB — BASIC METABOLIC PANEL
BUN: 18 mg/dL (ref 6–23)
Chloride: 108 mEq/L (ref 96–112)
Potassium: 4.1 mEq/L (ref 3.5–5.1)
Sodium: 141 mEq/L (ref 135–145)

## 2010-06-16 LAB — APTT: aPTT: 28 seconds (ref 24–37)

## 2010-06-16 LAB — ABO/RH: ABO/RH(D): O NEG

## 2010-07-28 ENCOUNTER — Other Ambulatory Visit: Payer: Self-pay

## 2010-07-28 ENCOUNTER — Other Ambulatory Visit: Payer: Self-pay | Admitting: Internal Medicine

## 2010-07-28 DIAGNOSIS — Z0000 Encounter for general adult medical examination without abnormal findings: Secondary | ICD-10-CM

## 2010-08-02 ENCOUNTER — Encounter: Payer: Self-pay | Admitting: Internal Medicine

## 2010-08-02 DIAGNOSIS — Z0000 Encounter for general adult medical examination without abnormal findings: Secondary | ICD-10-CM | POA: Insufficient documentation

## 2010-08-04 ENCOUNTER — Other Ambulatory Visit (INDEPENDENT_AMBULATORY_CARE_PROVIDER_SITE_OTHER): Payer: Medicare Other | Admitting: Internal Medicine

## 2010-08-04 ENCOUNTER — Ambulatory Visit (INDEPENDENT_AMBULATORY_CARE_PROVIDER_SITE_OTHER): Payer: Medicare Other | Admitting: Internal Medicine

## 2010-08-04 ENCOUNTER — Other Ambulatory Visit (INDEPENDENT_AMBULATORY_CARE_PROVIDER_SITE_OTHER): Payer: Medicare Other

## 2010-08-04 ENCOUNTER — Encounter: Payer: Self-pay | Admitting: Internal Medicine

## 2010-08-04 VITALS — BP 142/62 | HR 51 | Temp 98.1°F | Ht 59.0 in | Wt 117.1 lb

## 2010-08-04 DIAGNOSIS — Z0000 Encounter for general adult medical examination without abnormal findings: Secondary | ICD-10-CM

## 2010-08-04 DIAGNOSIS — E119 Type 2 diabetes mellitus without complications: Secondary | ICD-10-CM

## 2010-08-04 LAB — BASIC METABOLIC PANEL
CO2: 29 mEq/L (ref 19–32)
Calcium: 9.6 mg/dL (ref 8.4–10.5)
GFR: 59.99 mL/min — ABNORMAL LOW (ref >60.00)
Potassium: 5.2 mEq/L — ABNORMAL HIGH (ref 3.5–5.1)
Sodium: 145 mEq/L (ref 135–145)

## 2010-08-04 LAB — CBC WITH DIFFERENTIAL/PLATELET
Basophils Absolute: 0 10*3/uL (ref 0.0–0.1)
Basophils Relative: 0.3 % (ref 0.0–3.0)
Eosinophils Absolute: 0.4 10*3/uL (ref 0.0–0.7)
HCT: 39.8 % (ref 36.0–46.0)
Hemoglobin: 13.2 g/dL (ref 12.0–15.0)
Lymphocytes Relative: 16.6 % (ref 12.0–46.0)
Lymphs Abs: 1.3 10*3/uL (ref 0.7–4.0)
MCHC: 33.2 g/dL (ref 30.0–36.0)
MCV: 90 fl (ref 78.0–100.0)
Neutro Abs: 5.3 10*3/uL (ref 1.4–7.7)
RBC: 4.43 Mil/uL (ref 3.87–5.11)
RDW: 15 % — ABNORMAL HIGH (ref 11.5–14.6)

## 2010-08-04 LAB — LIPID PANEL
HDL: 40.5 mg/dL (ref >39.00)
Total CHOL/HDL Ratio: 3

## 2010-08-04 LAB — URINALYSIS, ROUTINE W REFLEX MICROSCOPIC
Ketones, ur: NEGATIVE
Specific Gravity, Urine: 1.015 (ref 1.000–1.030)
Total Protein, Urine: NEGATIVE
Urine Glucose: NEGATIVE

## 2010-08-04 LAB — MICROALBUMIN / CREATININE URINE RATIO
Creatinine,U: 120.9 mg/dL
Microalb Creat Ratio: 4.6 mg/g (ref 0.0–30.0)

## 2010-08-04 LAB — HEPATIC FUNCTION PANEL
AST: 19 U/L (ref 0–37)
Alkaline Phosphatase: 34 U/L — ABNORMAL LOW (ref 39–117)
Bilirubin, Direct: 0.1 mg/dL (ref 0.0–0.3)
Total Protein: 6.9 g/dL (ref 6.0–8.3)

## 2010-08-04 NOTE — Progress Notes (Signed)
Subjective:    Patient ID: Tracy [name], female    DOB: February 09, 1927, 77 y.o.   MRN: [id number]  HPI  Here for wellness and f/u;  Overall doing ok;  Pt denies CP, worsening SOB, DOE, wheezing, orthopnea, PND, worsening LE edema, palpitations, dizziness or syncope.  Pt denies neurological change such as new Headache, facial or extremity weakness.  Pt denies polydipsia, polyuria, or low sugar symptoms. Pt states overall good compliance with treatment and medications, good tolerability, and trying to follow lower cholesterol diet.  Pt denies worsening depressive symptoms, suicidal ideation or panic. No fever, wt loss, night sweats, loss of appetite, or other constitutional symptoms.  Pt states good ability with ADL's, low fall risk, home safety reviewed and adequate, no significant changes in hearing or vision, and occasionally active with exercise. Cognitive no change. No recent falls and remains active socially Past Medical History  Diagnosis Date  . Hyperlipidemia   . HTN (hypertension)   . GERD (gastroesophageal reflux disease)   . CVA (cerebrovascular accident) 2002  . Osteopenia   . Hx of adenomatous colonic polyps 2007  . Diverticulosis of colon   . DM (diabetes mellitus), type 2   . PVD (peripheral vascular disease)     recently dx w/ carotid -left  int carotid >90% 50-60% rt int carotid 02/2010  . PVD (peripheral vascular disease)     lf  ABI 0.11 and rt 0.43  . Bradycardia    Past Surgical History  Procedure Date  . Cns vascular stent per neurosurgeon   . Tubal ligation   . Right shoulder surgery 2010  . Cataract eye surgery   . Lazer eye surgery     reports that she has quit smoking. She does not have any smokeless tobacco history on file. She reports that she does not drink alcohol or use illicit drugs. family history includes Cancer in her mother. No Known Allergies Current Outpatient Prescriptions on File Prior to Visit  Medication Sig Dispense Refill  .  amLODipine-benazepril (LOTREL) 10-40 MG per capsule Take 1 capsule by mouth daily.        Marland Kitchen aspirin 81 MG tablet Take 81 mg by mouth daily.        . Calcium Carbonate (CALTRATE 600) 1500 MG TABS Take by mouth 2 (two) times daily.        . clopidogrel (PLAVIX) 75 MG tablet Take 75 mg by mouth daily.        . fenofibrate micronized (LOFIBRA) 134 MG capsule Take 134 mg by mouth 1 dose over 46 hours.        . Multiple Vitamin (MULTIVITAMIN) capsule Take 1 capsule by mouth daily.        . pentoxifylline (TRENTAL) 400 MG CR tablet Take 400 mg by mouth. Daily        . Probiotic Product (PHILLIPS COLON HEALTH) CAPS Take by mouth 2 (two) times daily.        . risedronate (ACTONEL) 35 MG tablet Take 35 mg by mouth every 7 (seven) days. with water on empty stomach, nothing by mouth or lie down for next 30 minutes.       . Wheat Dextrin (BENEFIBER) POWD Take by mouth 1 dose over 46 hours.         Review of Systems Review of Systems  Constitutional: Negative for diaphoresis, activity change, appetite change and unexpected weight change.  HENT: Negative for hearing loss, ear pain, facial swelling, mouth sores and neck stiffness.   Eyes:  Negative for pain, redness and visual disturbance.  Respiratory: Negative for shortness of breath and wheezing.   Cardiovascular: Negative for chest pain and palpitations.  Gastrointestinal: Negative for diarrhea, blood in stool, abdominal distention and rectal pain.  Genitourinary: Negative for hematuria, flank pain and decreased urine volume.  Musculoskeletal: Negative for myalgias and joint swelling.  Skin: Negative for color change and wound.  Neurological: Negative for syncope and numbness.  Hematological: Negative for adenopathy.  Psychiatric/Behavioral: Negative for hallucinations, self-injury, decreased concentration and agitation.      Objective:   Physical Exam BP 142/62  Pulse 51  Temp(Src) 98.1 F (36.7 C) (Oral)  Ht 4\' 11"  (1.499 m)  Wt 117 lb 2 oz  (53.128 kg)  BMI 23.66 kg/m2  SpO2 97% Physical Exam  VS noted Constitutional: Pt is oriented to person, place, and time. Appears well-developed and well-nourished.  HENT:  Head: Normocephalic and atraumatic.  Right Ear: External ear normal.  Left Ear: External ear normal.  Nose: Nose normal.  Mouth/Throat: Oropharynx is clear and moist.  Eyes: Conjunctivae and EOM are normal. Pupils are equal, round, and reactive to light.  Neck: Normal range of motion. Neck supple. No JVD present. No tracheal deviation present.  Cardiovascular: Normal rate, regular rhythm, normal heart sounds and intact distal pulses.   Pulmonary/Chest: Effort normal and breath sounds normal.  Abdominal: Soft. Bowel sounds are normal. There is no tenderness.  Musculoskeletal: Normal range of motion. Exhibits no edema.  Lymphadenopathy:  Has no cervical adenopathy.  Neurological: Pt is alert and oriented to person, place, and time. Pt has normal reflexes. No cranial nerve deficit.  Skin: Skin is warm and dry. No rash noted.  Psychiatric:  Has  normal mood and affect. Behavior is normal. 1+ nervous Able to ascend the exam table without signficant difficulty       Assessment & Plan:

## 2010-08-04 NOTE — Patient Instructions (Signed)
Continue all other medications as before Please go to LAB in the Basement for the blood and/or urine tests to be done today Please call the number on the Blue Card (the [employer] System) for results of testing in 2-3 days Please return in 6 mo with Lab testing done 3-5 days before  

## 2010-08-04 NOTE — Progress Notes (Signed)
Quick Note:  Voice message left on [employer] system - lab is negative, normal or otherwise stable, pt to continue same tx ______ 

## 2010-08-04 NOTE — Assessment & Plan Note (Signed)
Persists but asymtp; ok to follow, pt has declines pacemaker in the past

## 2010-08-04 NOTE — Assessment & Plan Note (Signed)
stable overall by hx and exam, most recent lab reviewed with pt, and pt to continue medical treatment as before  Lab Results  Component Value Date   HGBA1C 6.2 08/04/2010

## 2010-08-04 NOTE — Assessment & Plan Note (Signed)

## 2010-08-18 NOTE — Procedures (Signed)
CAROTID DUPLEX EXAM   INDICATION:  Bruit.   HISTORY:  Diabetes:  No.  Cardiac:  No.  Hypertension:  No.  Smoking:  No.  Previous Surgery:  No.  CV History:  The patient is currently asymptomatic.  Amaurosis Fugax No, Paresthesias No, Hemiparesis No                                       RIGHT             LEFT  Brachial systolic pressure:         140               138  Brachial Doppler waveforms:         WNL               WNL  Vertebral direction of flow:        Antegrade         Antegrade  DUPLEX VELOCITIES (cm/sec)  CCA peak systolic                   69                92  ECA peak systolic                   133               132  ICA peak systolic                   169               502  ICA end diastolic                   39                192  PLAQUE MORPHOLOGY:                  Heterogeneous     Heterogeneous  PLAQUE AMOUNT:                      Moderate          Moderate to severe  PLAQUE LOCATION:                    ICA, CCA and bulb ICA, CCA and bulb   IMPRESSION:  1. Right side internal carotid artery stenosis of 40%-59%.  2. Left side internal carotid artery stenosis of 80%-99%.  3. Bilateral intimal thickening within the common carotid arteries.  4. Discussed these findings with Dr. Hart Rochester.   ___________________________________________  Quita Skye. Hart Rochester, M.D.   OD/MEDQ  D:  02/24/2010  T:  02/24/2010  Job:  [id number]

## 2010-08-18 NOTE — Assessment & Plan Note (Signed)
OFFICE VISIT   ELENER, CUSTODIO  DOB:  25-Apr-1926                                       03/17/2010  JWJXB#:[id number]   The patient returns today for continued follow-up regarding her lower  extremity occlusive disease and her severe left internal carotid  stenosis.  She had an angiogram and intervention performed by Dr.  Myra Gianotti on November 25, which included stenting of her right common  iliac, left common iliac and left external iliac arteries.  Her ABI  improved on the left from 0.1 to today's study revealing a 0.58 on the  left and a 0.48 on the right.  She no longer has rest pain in the left  foot and is able to ambulate about 50 yards.  She has no history of  infection or ulceration.  She denies any further hemispheric or  nonhemispheric TIAs, amaurosis fugax, diplopia, blurred vision or  syncope.  She did have bradycardia while in the hospital following her  angiogram, was evaluated by Dr. Charlies Constable and that evaluation is  ongoing.  She did have a Myoview study performed yesterday and it is  unclear whether she will need a pacemaker.   CHRONIC MEDICAL PROBLEMS:  1. Hypertension.  2. Hyperlipidemia.  3. History of mini-stroke in 2003.  4. Bradycardia.   SOCIAL HISTORY:  She is widowed, has 5 children.  Does not use tobacco,  has not for 20 years.  Does not use alcohol.   REVIEW OF SYSTEMS:  Negative for chest pain, dyspnea on exertion, PND,  orthopnea or syncope.  No neurologic symptoms.  All other systems are  negative.   PHYSICAL EXAMINATION:  Blood pressure is 146/68, heart rate is 58,  respirations 14.  Generally, a well-developed, well-nourished female in  no apparent distress, she is alert and oriented x3.  HEENT:  Normal for  age.  EOMs intact.  Lungs:  Clear to auscultation.  No rhonchi or  wheezing.  Cardiovascular:  There is a regular rhythm, no murmurs.  Her  heart rate is in the 40s to 50s and regular.  Carotid pulses are 3+  with  a harsh bruit on the left.  Abdomen:  Soft, nontender, with no masses.  She has 3+ femoral pulses bilaterally and well-perfused lower  extremities, no infection or ulceration.   Today I ordered lower extremity arterial Dopplers, which I have reviewed  interpreted as noted and, as noted, ABI on the left is 0.58 and 0.48 on  the right.   I think she is doing well from a lower extremity standpoint, will not  need any further intervention unless her symptoms worsen.  She does need  a left carotid endarterectomy as soon as she is cleared by Dr. Juanda Chance,  and he will determine whether she needs a pacemaker preoperatively.  We  will be in touch with her to schedule this as soon as we hear from the  cardiology evaluation.     Quita Skye Hart Rochester, M.D.  Electronically Signed   JDL/MEDQ  D:  03/17/2010  T:  03/18/2010  Job:  [id number]   cc:   Everardo Beals. Juanda Chance, MD, [facility]  Corwin Levins, MD

## 2010-08-18 NOTE — Consult Note (Signed)
NEW PATIENT CONSULTATION   Tracy [name], BROCKS  DOB:  1926/10/29                                       02/24/2010  HYQMV#:[id number]   The patient is an 77 year old female with recent history of pain in the  left second toe for the past 2 weeks.  She has no trauma to this area.  Both legs feel tired when she walks, she does not ambulate long  distances.  She had arterial Dopplers performed in Dr. Raphael Gibney office  with following findings:  Left leg 0.11, right leg 0.43 ABIs.  She has  no history of previous nonhealing ulcers, infection or cellulitis and is  referred for vascular evaluation.  She denies any hemispheric or  nonhemispheric TIAs, amaurosis fugax, diplopia, blurred vision or  syncope, although she had a mini stroke in 2003.   CHRONIC MEDICAL PROBLEMS:  1. Hypertension.  2. Hyperlipidemia.  3. History of mini stroke in 2003 where she became very disoriented      with poor memory and then this resolved.  4. Denies any history of coronary artery disease, diabetes or COPD.   SOCIAL HISTORY:  She is widowed, has five children.  Does not use  tobacco, has not for 20 years. Does not use alcohol.   FAMILY HISTORY:  Positive diabetes in a sister.  Negative for coronary  artery disease and stroke.   REVIEW OF SYSTEMS:  Positive for lower extremity discomfort with walking  and while flat.  All other systems are negative in review of systems.   PHYSICAL EXAMINATION:  Blood pressure 140/60, heart rate 51 and  irregular, respirations 24.  General:  She is an elderly female in no  apparent distress, alert and oriented x3.  HEENT:  Normal for age.  EOMs  intact.  Lungs:  Clear to auscultation.  No rhonchi or wheezing.  Cardiovascular:  Regular rhythm.  No murmurs.  Carotid pulses are 3+  with a very harsh high-pitched bruit on the left side, softer bruit on  the right.  Abdomen:  Soft, nontender with no masses.  Musculoskeletal:  Free of major deformities.   Neurologic:  Normal.  Skin:  Note definite  ulceration noted but blistering just proximal to the nail of the left  second toe over about a 3-mm diameter.  Lower extremity exam reveals 1+  femoral pulse on the right none on the left.  No distal pulses palpable.   Today I ordered a carotid duplex exam which revealed a very high-grade  left internal carotid stenosis exceeding 90% with 50% to 60% right  internal carotid stenosis.   The patient has severe aortoiliac and femoral popliteal occlusive  disease as well as a severe left internal carotid stenosis which is  currently asymptomatic.  Scheduled her for an angiogram November 25 to  see if intervention is feasible to improve circulation to her left leg  or if bypass will be necessary for limb salvage.  She will also need a  left carotid endarterectomy at some point in the near future.     Quita Skye Hart Rochester, M.D.  Electronically Signed   JDL/MEDQ  D:  02/24/2010  T:  02/25/2010  Job:  [id number]   cc:   Corwin Levins, MD

## 2010-08-21 NOTE — Op Note (Signed)
[place]. [facility]  Patient:    Tracy [name], Tracy [name] Visit Number: [id number] MRN: [id number]          Service Type: DSU Location: [facility] 2899 19 Attending Physician:  Karenann Cai Dictated by:   Marya Landry Carlyle Lipa., M.D. Proc. Date: 12/23/00 Admit Date:  12/23/2000                             Operative Report  PREOPERATIVE DIAGNOSIS:  Opaque posterior capsule both eyes.  POSTOPERATIVE DIAGNOSIS:  Opaque posterior capsule both eyes.  OPERATION:  YAG laser capsulotomy both eyes.  SURGEON:  Marya Landry. Carlyle Lipa., M.D.  ANESTHESIA: None.  JUSTIFICATION FOR PROCEDURE:  This is a 77 year old lady who has undergone bilateral cataract extractions.  She has recently began to complain of difficulty seeing to read and that things looked blurry.  She was evaluated and found to have visual acuity best corrected to 20/30 each eye.  There are bilateral opaque posterior capsules.  YAG laser capsulotomy was recommended. She is admitted at this time for this procedure.  DESCRIPTION OF PROCEDURE:  The patient is brought to the laser room where she was positioned appropriately behind the YAG laser.  The right eye was treated first with approximately 9 applications of laser energy, 2 millijoules, obtaining an opening of 3 to 4 mm.  Then attention was directed to the left eye.  At which time, the laser was positioned appropriately and again approximately 19 applications of laser energy of 2 millijoules were applied to the posterior capsule, obtaining of 3 to 4 mm.  The patient tolerated the procedure well and was discharged to postanesthesia recovery room in satisfactory condition.  She is instructed to see me in the office today for further evaluation.  DISCHARGE DIAGNOSIS:  Opaque posterior capsule both eyes. Dictated by:   Marya Landry Carlyle Lipa., M.D. Attending Physician:  Karenann Cai DD:  12/23/00 TD:  12/23/00 Job:  [id number]

## 2010-08-21 NOTE — Op Note (Signed)
[place]. [facility]  Patient:    Tracy [name], Tracy [name]                     MRN: [id number] Proc. Date: 08/06/00 Adm. Date:  [id number] Disc. Date: [id number] Attending:  Karenann Cai                           Operative Report  PREOPERATIVE DIAGNOSIS:  Immature cataract, right eye.  POSTOPERATIVE DIAGNOSIS:  Immature cataract, right eye.  OPERATION PERFORMED:  Kelman phacoemulsification of cataract, right eye.  SURGEON:  Marya Landry. Carlyle Lipa., M.D.  ANESTHESIA:  Local using Xylocaine 2% with Marcaine 0.75% and Wydase.  INDICATIONS FOR PROCEDURE:  The patient is a 77 year old lady who had a cataract extracted from the left eye several months ago.  She still complains of blurring of vision, particularly when seeing to read.  She has had a recent CVA and she thinks her vision is worse.  She was evaluated and found to have a visual acuity best corrected at 20/70 on the right and 20/30 on the left. There was a cataract present on the right with intraocular lens present on the left.  Cataract extraction of the right eye was recommended and she was admitted at this time for that purpose.  DESCRIPTION OF PROCEDURE:  Under influence of IV sedation, Van Lint akinesia and retrobulbar anesthesia was given.  The patient was prepped and draped in the usual manner.  The lid speculum was inserted under the upper and lower lid of the right eye and 4-0 silk traction suture was passed through the belly of the superior rectus muscle for traction.  A fornix-based conjunctival flap was turned.  Hemostasis was achieved using cautery.  An incision was made in the sclera approximately 1 mm posterior to the limbus.  This incision was dissected down to clear cornea using the crescent blade.  A side port incision was made at the 1:30 oclock position.  Viscoat was injected into the eye through the side port incision.  The anterior chamber was then entered through the  corneoscleral tunnel incision at the 11:30 oclock position using a 2.2 mm keratome.  An anterior capsulotomy was done using a bent 25 gauge needle.  The nucleus was hydrodissected using Xylocaine.  The KPE handpiece was passed into the eye and the nucleus was emulsified without difficulty.  The residual cortical material was aspirated.  The posterior capsule was polished using olive tip polisher.  The wound was widened slightly to accommodate a foldable silicon lens.  This lens was seated into the eye behind the iris ____________ The anterior chamber was reformed and the pupil was constricted using Miochol. The residual Viscoat was aspirated from the eye.  The lips of the wound were hydrated with balanced salt solution.  The wound was tested to make sure that it was watertight.  The conjunctiva was then closed over the wound by using thermal cautery.  1 cc of Celestone, 0.5 cc of gentamicin were injected subconjunctivally.  Maxitrol ophthalmic ointment and Pilopine ointment were applied along with the patch and Fox shield.  The patient tolerated the procedure well and was discharged to the post anesthesia recovery room in satisfactory condition.  She was instructed to rest today, to take Vicodin every four hours as needed for pain and to see me in the office tomorrow for further evaluation.  DISCHARGE DIAGNOSES:  Immature cataract, right eye. DD:  08/06/00 TD:  08/08/00 Job: [id number]

## 2010-08-21 NOTE — Op Note (Signed)
[place]. [facility]  Patient:    Tracy [name], Tracy [name]                       MRN: [id number] Proc. Date: 09/07/99 Attending:  Marya Landry. Carlyle Lipa., M.D.                           Operative Report  PREOPERATIVE DIAGNOSIS:  Immature cataract, left eye.  POSTOPERATIVE DIAGNOSIS:  Immature cataract, left eye.  OPERATION PERFORMED:  Kelman phacoemulsification of cataract, left eye.  SURGEON:  Marya Landry. Carlyle Lipa., M.D.  ANESTHESIA:  Local using Xylocaine 2% with Marcaine 0.75% and Wydase.  JUSTIFICATION FOR PROCEDURE:  This 77 year old lady had been followed for some time for cataract formation.  She complains of blurring of vision with difficulty seeing to drive.  She was evaluated and found to have visual acuity best corrected to 20/50 on the right and 20/50 on the left.  There were bilateral posterior subcapsular cataracts and nuclear sclerosis.  Cataract extraction of the left eye was recommended.  She is admitted at this time for that purpose.  DESCRIPTION OF PROCEDURE:  Under influence of IV sedation and a Van Lint akinesia, retrobulbar anesthesia was given.  The patient was prepped and draped in the usual manner.  Lid speculum was inserted under the upper and lower lid of the left eye and a 4-0 silk traction suture was passed through the belly of the superior rectus muscle for traction.  A fornix-based conjunctival flap was turned.  Hemostasis achieved using the cautery.  An incision was made in the sclera approximately 1 mm posterior to the limbus. The incision was dissected down into clear cornea using a crescent blade.  A stab wound incision was made at the 1:30 position.  OcuCoat was injected into the eye through that sideport incision.  The anterior chamber was then entered through the corneoscleral tunnel incision at the 11:30 position.  An anterior capsulotomy was done using a bent 25 gauge needle.  The nucleus was hydrodissected using  Xylocaine.  The KP handpiece was passed into the eye and the nucleus was emulsified without difficulty.  The residual cortical material was aspirated.  The posterior capsule was then polished using an olive tip polisher.  The wound was then widened slightly to accommodate a 5 x 6 oval intraocular lens.  This lens was seated in the eye behind the iris without difficulty.  The anterior chamber was reformed and the pupil was constricted using Miochol.  The corneoscleral wound was then closed using a single horizontal suture of 10-0 nylon.  After ascertaining that the wound was air-tight and water-tight, the conjunctiva was closed using thermal cautery. 1 cc of Celestone, 0.5 cc of gentamicin were injected subconjunctivally. Maxitrol ophthalmic ointment and Pilopene ointment were applied along with a patch and Fox shield.  The patient tolerated the procedure well and was discharged to post anesthesia care unit in satisfactory condition.  She will be discharged when stable with instructions to rest the remainder of the day, take Vicodin every four hours as needed for pain and see me in the office tomorrow for further evaluation.  DISCHARGE DIAGNOSES:  Immature cataract, left eye. DD:  09/07/99 TD:  09/10/99 Job: [id number]

## 2010-08-27 ENCOUNTER — Ambulatory Visit: Payer: Medicare Other | Admitting: Internal Medicine

## 2010-09-01 NOTE — Discharge Summary (Addendum)
  Tracy [name], LARTIGUE              ACCOUNT NO.:  [id number]  MEDICAL RECORD NO.:  [id number]          PATIENT TYPE:  INP  LOCATION:  3301                         FACILITY:  [facility]  PHYSICIAN:  Quita Skye. Hart Rochester, M.D.  DATE OF BIRTH:  24-Nov-1926  DATE OF ADMISSION:  04/30/2010 DATE OF DISCHARGE:  05/01/2010                              DISCHARGE SUMMARY   HISTORY OF PRESENT ILLNESS:  This is an 77 year old female who had endovascular procedures, treated ischemia of the left lower extremity. She had high-grade left internal carotid artery stenosis greater than 90%, which was asymptomatic.  She was admitted for a left carotid endarterectomy.  PAST MEDICAL HISTORY:  Significant for bradycardia, peripheral vascular disease, diet-controlled diabetes, hypertension, TIA in 2003, hyperlipidemia, tubular adenoma, colon polyps, and GERD.  HOSPITAL COURSE:  The patient was taken to the operating room on April 30, 2010, for left carotid endarterectomy with Dacron patch angioplasty. Postop, the patient did well.  She neurologically remained intact.  She had some bradycardias followed by [place].  This resolved well.  She had some hematoma but was nonrestrictive.  She had no difficulty swallowing or breathing.  Hematoma was soft, and she was discharged on May 01, 2010.  DISCHARGE MEDICATIONS: 1. Plavix 75 mg daily. 2. Metaxalone 400 mg daily. 3. Multivitamins daily. 4. Lotrel 10/40 mg daily. 5. Fenofibrate 134 mg daily. 6. Calcium carbonate one daily. 7. Aspirin 81 mg daily. 8. Actonel 35 mg monthly.  FINAL DIAGNOSIS:  Asymptomatic left carotid stenosis, status post left carotid endarterectomy.  All the other medical issues were controlled with her present medications with spontaneous resolution of some postop bradycardia.  DISPOSITION:  The patient is discharged to home with followup in 2 weeks with Dr. Hart Rochester.     [name],  PA-C   ______________________________ Quita Skye Hart Rochester, M.D.    RR/MEDQ  D:  08/29/2010  T:  08/29/2010  Job:  [id number]  Electronically Signed by Josephina Gip M.D. on 09/01/2010 09:32:38 AM Electronically Signed by [name] PA on 09/01/2010 12:14:27 PM

## 2010-09-21 ENCOUNTER — Encounter (INDEPENDENT_AMBULATORY_CARE_PROVIDER_SITE_OTHER): Payer: Medicare Other

## 2010-09-21 ENCOUNTER — Other Ambulatory Visit (INDEPENDENT_AMBULATORY_CARE_PROVIDER_SITE_OTHER): Payer: Medicare Other

## 2010-09-21 DIAGNOSIS — Z48812 Encounter for surgical aftercare following surgery on the circulatory system: Secondary | ICD-10-CM

## 2010-09-21 DIAGNOSIS — I70229 Atherosclerosis of native arteries of extremities with rest pain, unspecified extremity: Secondary | ICD-10-CM

## 2010-09-29 NOTE — Procedures (Unsigned)
CAROTID DUPLEX EXAM  INDICATION:  Follow up carotid artery disease.  HISTORY: Diabetes:  No. Cardiac:  Yes. Hypertension:  Yes. Smoking:  Previous. Previous Surgery:  Left carotid endarterectomy, 04/30/2010, Dr. Hart Rochester. CV History:  Patient is currently asymptomatic. Amaurosis Fugax No, Paresthesias No, Hemiparesis No.                                      RIGHT             LEFT Brachial systolic pressure:         126               139 Brachial Doppler waveforms:         WNL               WNL Vertebral direction of flow:        Atypical antegrade                  Antegrade DUPLEX VELOCITIES (cm/sec) CCA peak systolic                   67                80 ECA peak systolic                   87                99 ICA peak systolic                   146               66 ICA end diastolic                   38                23 PLAQUE MORPHOLOGY:                  Heterogenous      Heterogenous PLAQUE AMOUNT:                      Moderate          Mild to moderate PLAQUE LOCATION:                    CCA, ICA, ECA     CCA  IMPRESSION:  Right internal carotid artery has 40% to 59% stenosis. Patent left carotid endarterectomy with no hemodynamically significant stenosis.  Atypical antegrade flow within the right vertebral with decreased end-diastolic velocities suggestive of stenosis proximally. Intimal thickening within the right common carotid artery.  ___________________________________________ Quita Skye Hart Rochester, M.D.  OD/MEDQ  D:  09/22/2010  T:  09/22/2010  Job:  [id number]

## 2010-09-29 NOTE — Procedures (Unsigned)
AORTA-ILIAC DUPLEX EVALUATION  INDICATION:  Aortic iliac duplex evaluation.  HISTORY: Diabetes:  Follow up stents. Cardiac:  No. Hypertension:  Yes. Smoking:  Yes. Previous Surgery:  Bilateral common iliac and left external iliac stents, 02/27/2010.              SINGLE LEVEL ARTERIAL EXAM                             RIGHT                  LEFT Brachial:                  126                    139 Anterior tibial:           75                     71 Posterior tibial:          Absent                 Absent Peroneal: Ankle/brachial index:      0.54                   0.51 Previous ABI/date:         06/16/2010, 0.53       06/16/2010, 0.59  AORTA-ILIAC DUPLEX EXAM Aorta - Proximal     0.54 cm/s Aorta - Mid          0.34 cm/s Aorta - Distal       0.46 cm/s  RIGHT                                   LEFT 0.35 cm/s         CIA-PROXIMAL          0.94 cm/s 0.54 cm/s         CIA-DISTAL            1.67 cm/s 0.44 cm/s         HYPOGASTRIC           1.08 cm/s 0.55 cm/s         EIA-PROXIMAL          1.16 cm/s 0.94 cm/s         EIA-MID               0.92 cm/s 0.93 cm/s         EIA-DISTAL            1.76 cm/s  IMPRESSION:  Bilateral patent common iliac and left external iliac stents with no increased velocities.  Mild plaquing visualized within the bilateral stents.  ___________________________________________ Quita Skye. Hart Rochester, M.D.  OD/MEDQ  D:  09/22/2010  T:  09/22/2010  Job:  [id number]

## 2010-10-06 ENCOUNTER — Encounter: Payer: Self-pay | Admitting: Gastroenterology

## 2010-10-08 ENCOUNTER — Encounter: Payer: Self-pay | Admitting: Internal Medicine

## 2010-10-08 ENCOUNTER — Ambulatory Visit (INDEPENDENT_AMBULATORY_CARE_PROVIDER_SITE_OTHER): Payer: Medicare Other | Admitting: Internal Medicine

## 2010-10-08 VITALS — BP 116/69 | HR 43 | Resp 16 | Ht 59.0 in | Wt 118.0 lb

## 2010-10-08 DIAGNOSIS — I495 Sick sinus syndrome: Secondary | ICD-10-CM

## 2010-10-08 DIAGNOSIS — I10 Essential (primary) hypertension: Secondary | ICD-10-CM

## 2010-10-08 NOTE — Assessment & Plan Note (Signed)
She remains asymptomatic and is doing quite well. There is presently no indication for pacemaker implantation. We will continue observation.  Return in 6 months

## 2010-10-08 NOTE — Assessment & Plan Note (Signed)
Stable No change required today  

## 2010-10-08 NOTE — Progress Notes (Signed)
The patient presents today for routine electrophysiology followup.  Since last being seen in our clinic, the patient reports doing very well.  She remains quite active.  She does her own house work and continues to go bowling twice each week without difficulty.  Today, she denies symptoms of palpitations, chest pain, shortness of breath, orthopnea, PND, lower extremity edema, dizziness, presyncope, syncope, or neurologic sequela.  The patient feels that she is tolerating medications without difficulties and is otherwise without complaint today.   Past Medical History  Diagnosis Date  . Hyperlipidemia   . HTN (hypertension)   . GERD (gastroesophageal reflux disease)   . CVA (cerebrovascular accident) 2002  . Osteopenia   . Hx of adenomatous colonic polyps 2007  . Diverticulosis of colon   . DM (diabetes mellitus), type 2   . PVD (peripheral vascular disease)     recently dx w/ carotid -left  int carotid >90% 50-60% rt int carotid 02/2010  . PVD (peripheral vascular disease)     lf  ABI 0.11 and rt 0.43  . Bradycardia     asymptomatic   Past Surgical History  Procedure Date  . Cns vascular stent per neurosurgeon   . Tubal ligation   . Right shoulder surgery 2010  . Cataract eye surgery   . Lazer eye surgery     Current Outpatient Prescriptions  Medication Sig Dispense Refill  . amLODipine-benazepril (LOTREL) 10-40 MG per capsule Take 1 capsule by mouth daily.        Marland Kitchen aspirin 81 MG tablet Take 81 mg by mouth daily.        . Calcium Carbonate (CALTRATE 600) 1500 MG TABS Take by mouth 2 (two) times daily.        . clopidogrel (PLAVIX) 75 MG tablet Take 75 mg by mouth daily.        . fenofibrate micronized (LOFIBRA) 134 MG capsule Take 134 mg by mouth 1 dose over 46 hours.        Marland Kitchen HYDROcodone-acetaminophen (VICODIN) 5-500 MG per tablet Take 1 tablet by mouth every 6 (six) hours as needed.        . Multiple Vitamin (MULTIVITAMIN) capsule Take 1 capsule by mouth daily.        .  pentoxifylline (TRENTAL) 400 MG CR tablet Take 400 mg by mouth. Daily        . Probiotic Product (PHILLIPS COLON HEALTH) CAPS Take by mouth 2 (two) times daily.        . risedronate (ACTONEL) 35 MG tablet Take 35 mg by mouth every 7 (seven) days. with water on empty stomach, nothing by mouth or lie down for next 30 minutes.       . Wheat Dextrin (BENEFIBER) POWD Take by mouth 1 dose over 46 hours.          No Known Allergies  History   Social History  . Marital Status: Widowed    Spouse Name: N/A    Number of Children: 5  . Years of Education: N/A   Occupational History  . retired Ship broker   Social History Main Topics  . Smoking status: Former Games developer  . Smokeless tobacco: Not on file  . Alcohol Use: No  . Drug Use: No  . Sexually Active: Not on file   Other Topics Concern  . Not on file   Social History Narrative  . No narrative on file    Family History  Problem Relation Age of Onset  . Cancer Mother  colon   Physical Exam: Filed Vitals:   10/08/10 0934  BP: 116/69  Pulse: 43  Resp: 16  Height: 4\' 11"  (1.499 m)  Weight: 118 lb (53.524 kg)    GEN- The patient is well appearing, alert and oriented x 3 today.   Head- normocephalic, atraumatic Eyes-  Sclera clear, conjunctiva pink Ears- hearing intact Oropharynx- clear Neck- supple, no JVP Lymph- no cervical lymphadenopathy Lungs- Clear to ausculation bilaterally, normal work of breathing Heart- Regular rate and rhythm, no murmurs, rubs or gallops, PMI not laterally displaced GI- soft, NT, ND, + BS Extremities- no clubbing, cyanosis, or edema MS- no significant deformity or atrophy Skin- no rash or lesion Psych- euthymic mood, full affect Neuro- strength and sensation are intact  ekg today reveals sinus bradycardia  (43 bpm) with intermittent junctional escape beats (narrow complex), LVH  Assessment and Plan:

## 2011-02-02 ENCOUNTER — Encounter: Payer: Self-pay | Admitting: Internal Medicine

## 2011-02-04 ENCOUNTER — Ambulatory Visit: Payer: Medicare Other | Admitting: Internal Medicine

## 2011-02-09 ENCOUNTER — Ambulatory Visit (INDEPENDENT_AMBULATORY_CARE_PROVIDER_SITE_OTHER): Payer: Medicare Other | Admitting: Internal Medicine

## 2011-02-09 ENCOUNTER — Encounter: Payer: Self-pay | Admitting: Internal Medicine

## 2011-02-09 VITALS — BP 130/70 | HR 50 | Temp 97.5°F | Ht 59.0 in | Wt 120.2 lb

## 2011-02-09 DIAGNOSIS — I10 Essential (primary) hypertension: Secondary | ICD-10-CM

## 2011-02-09 DIAGNOSIS — M25512 Pain in left shoulder: Secondary | ICD-10-CM | POA: Insufficient documentation

## 2011-02-09 DIAGNOSIS — Z0000 Encounter for general adult medical examination without abnormal findings: Secondary | ICD-10-CM

## 2011-02-09 DIAGNOSIS — E119 Type 2 diabetes mellitus without complications: Secondary | ICD-10-CM

## 2011-02-09 DIAGNOSIS — M25519 Pain in unspecified shoulder: Secondary | ICD-10-CM

## 2011-02-09 MED ORDER — METHYLPREDNISOLONE ACETATE PF 80 MG/ML IJ SUSP
120.0000 mg | Freq: Once | INTRAMUSCULAR | Status: AC
  Administered 2011-02-09: 120 mg via INTRAMUSCULAR

## 2011-02-09 MED ORDER — HYDROCODONE-ACETAMINOPHEN 5-325 MG PO TABS: 1.0000 | ORAL_TABLET | Freq: Four times a day (QID) | ORAL | Status: DC | PRN

## 2011-02-09 MED ORDER — PREDNISONE 10 MG PO TABS: 10.0000 mg | ORAL_TABLET | Freq: Every day | ORAL | Status: AC

## 2011-02-09 NOTE — Patient Instructions (Signed)
Take all new medications as prescribed Continue all other medications as before Please call in 3-5 days if not improved, for orthopedic referral Please return in 6 mo with Lab testing done 3-5 days before

## 2011-02-09 NOTE — Assessment & Plan Note (Signed)
stable overall by hx and exam, most recent data reviewed with pt, and pt to continue medical treatment as before  BP Readings from Last 3 Encounters:  02/09/11 130/70  10/08/10 116/69  08/04/10 142/62

## 2011-02-09 NOTE — Assessment & Plan Note (Signed)
C/w mod to severe  Bursitis/tendonitis vs rot cuff tear;  Pt declines ortho referral at this time, is tryiing to avoid surgury as it is very important to her to cont her bowling with the group as she is doing so well at this time, despite my assuracne that she may not actually need surgury;  Will do depomedrol IM today and predpack, decliens PT as well;  Pt to call if not improved in 3-5 days for ortho referral; also for pain med prn

## 2011-02-09 NOTE — Assessment & Plan Note (Signed)
Diet controlled, stable overall by hx and exam, most recent data reviewed with pt, and pt to continue medical treatment as before;   pt to cont to monitor for polys and f/u for any cbg > 200

## 2011-02-14 ENCOUNTER — Encounter: Payer: Self-pay | Admitting: Internal Medicine

## 2011-02-14 NOTE — Progress Notes (Signed)
Subjective:    Patient ID: Tracy [name], female    DOB: [birth year], 77 y.o.   MRN: [id number]  HPI  Here with left shoulder pain, moderate to severe, for several months, worse to abduct and forward elevate, without trauma, fall, neck pain or distal extremity pain or weakness.  Pt denies chest pain, increased sob or doe, wheezing, orthopnea, PND, increased LE swelling, palpitations, dizziness or syncope.  Pt denies new neurological symptoms such as new headache, or facial or extremity weakness or numbness   Pt denies polydipsia, polyuria.  Pt states overall good compliance with meds, trying to follow lower cholesterol, diabetic diet, wt overall stable but little exercise however.   Overall good compliance with treatment, and good medicine tolerability.   Pt denies fever, wt loss, night sweats, loss of appetite, or other constitutional symptoms Past Medical History  Diagnosis Date  . Hyperlipidemia   . HTN (hypertension)   . GERD (gastroesophageal reflux disease)   . CVA (cerebrovascular accident) 2002  . Osteopenia   . Hx of adenomatous colonic polyps 2007  . Diverticulosis of colon   . DM (diabetes mellitus), type 2   . PVD (peripheral vascular disease)     recently dx w/ carotid -left  int carotid >90% 50-60% rt int carotid 02/2010  . PVD (peripheral vascular disease)     lf  ABI 0.11 and rt 0.43  . Bradycardia     asymptomatic  . BRADYCARDIA 05/29/2010  . CAROTID STENOSIS 03/10/2010  . CEREBROVASCULAR ACCIDENT, HX OF 12/25/2006  . COLONIC POLYPS, HX OF 08/01/2007  . CONSTIPATION 10/01/2008  . DIABETES MELLITUS, TYPE II 08/01/2007  . DIVERTICULOSIS, COLON 08/01/2007  . GERD 12/25/2006  . HYPERLIPIDEMIA 11/29/2006  . NECK MASS 10/01/2008  . OSTEOPENIA 12/25/2006  . PVD WITH CLAUDICATION 02/23/2010  . SHOULDER PAIN, RIGHT 08/01/2007  . SICK SINUS SYNDROME 03/10/2010   Past Surgical History  Procedure Date  . Cns vascular stent per neurosurgeon   . Tubal ligation   . Right shoulder  surgery 2010  . Cataract eye surgery   . Lazer eye surgery     reports that she has quit smoking. She does not have any smokeless tobacco history on file. She reports that she does not drink alcohol or use illicit drugs. family history includes Cancer in her mother. No Known Allergies Current Outpatient Prescriptions on File Prior to Visit  Medication Sig Dispense Refill  . amLODipine-benazepril (LOTREL) 10-40 MG per capsule Take 1 capsule by mouth daily.        Marland Kitchen aspirin 81 MG tablet Take 81 mg by mouth daily.        . Calcium Carbonate (CALTRATE 600) 1500 MG TABS Take by mouth 2 (two) times daily.        . clopidogrel (PLAVIX) 75 MG tablet Take 75 mg by mouth daily.        . fenofibrate micronized (LOFIBRA) 134 MG capsule Take 134 mg by mouth 1 dose over 46 hours.        . Multiple Vitamin (MULTIVITAMIN) capsule Take 1 capsule by mouth daily.        . pentoxifylline (TRENTAL) 400 MG CR tablet Take 400 mg by mouth. Daily        . Probiotic Product (PHILLIPS COLON HEALTH) CAPS Take by mouth 2 (two) times daily.        . risedronate (ACTONEL) 35 MG tablet Take 35 mg by mouth every 7 (seven) days. with water on empty stomach, nothing by mouth  or lie down for next 30 minutes.       . Wheat Dextrin (BENEFIBER) POWD Take by mouth 1 dose over 46 hours.         Review of Systems Review of Systems  Constitutional: Negative for diaphoresis and unexpected weight change.  HENT: Negative for drooling and tinnitus.   Eyes: Negative for photophobia and visual disturbance.  Respiratory: Negative for choking and stridor.   Gastrointestinal: Negative for vomiting and blood in stool.  Genitourinary: Negative for hematuria and decreased urine volume.       Objective:   Physical Exam BP 130/70  Pulse 50  Temp(Src) 97.5 F (36.4 C) (Oral)  Ht 4\' 11"  (1.499 m)  Wt 120 lb 4 oz (54.545 kg)  BMI 24.29 kg/m2  SpO2 97% Physical Exam  VS noted Constitutional: Pt appears well-developed and  well-nourished.  HENT: Head: Normocephalic.  Right Ear: External ear normal.  Left Ear: External ear normal.  Eyes: Conjunctivae and EOM are normal. Pupils are equal, round, and reactive to light.  Neck: Normal range of motion. Neck supple.  Cardiovascular: Normal rate and regular rhythm.   Pulmonary/Chest: Effort normal and breath sounds normal.  Neurological: Pt is alert. No cranial nerve deficit.  Skin: Skin is warm. No erythema.  Psychiatric: Pt behavior is normal. Thought content normal.  Left shoudler diffuse mild tender, decreased ROM with forw elev and abduction to 90 degrees only    Assessment & Plan:

## 2011-02-15 ENCOUNTER — Other Ambulatory Visit: Payer: Self-pay | Admitting: Internal Medicine

## 2011-02-16 ENCOUNTER — Other Ambulatory Visit: Payer: Self-pay | Admitting: Internal Medicine

## 2011-02-19 ENCOUNTER — Other Ambulatory Visit: Payer: Self-pay | Admitting: Internal Medicine

## 2011-03-03 ENCOUNTER — Other Ambulatory Visit: Payer: Self-pay | Admitting: Internal Medicine

## 2011-03-23 ENCOUNTER — Other Ambulatory Visit: Payer: Medicare Other

## 2011-04-08 ENCOUNTER — Ambulatory Visit (INDEPENDENT_AMBULATORY_CARE_PROVIDER_SITE_OTHER): Payer: Medicare Other | Admitting: Vascular Surgery

## 2011-04-08 DIAGNOSIS — I6529 Occlusion and stenosis of unspecified carotid artery: Secondary | ICD-10-CM

## 2011-04-08 DIAGNOSIS — Z48812 Encounter for surgical aftercare following surgery on the circulatory system: Secondary | ICD-10-CM

## 2011-04-08 DIAGNOSIS — I739 Peripheral vascular disease, unspecified: Secondary | ICD-10-CM

## 2011-04-13 NOTE — Procedures (Unsigned)
CAROTID DUPLEX EXAM  INDICATION:  Carotid stenosis.  HISTORY: Diabetes:  No. Cardiac:  No. Hypertension:  Yes. Smoking:  Previous. Previous Surgery:  Left carotid endarterectomy on 04/30/2010. CV History:  Left shoulder/arm weakness, otherwise asymptomatic. Amaurosis Fugax No, Paresthesias No, Hemiparesis No                                      RIGHT             LEFT Brachial systolic pressure:         129               108 Brachial Doppler waveforms:         Triphasic         Biphasic Vertebral direction of flow:        Antegrade         Antegrade DUPLEX VELOCITIES (cm/sec) CCA peak systolic                   89                116 ECA peak systolic                   151               101 ICA peak systolic                   172               73 ICA end diastolic                   43                21 PLAQUE MORPHOLOGY:                  Heterogenous      Heterogenous PLAQUE AMOUNT:                      Moderate          Mild PLAQUE LOCATION:                    CCA/ICA/ECA       CCA  IMPRESSION: 1. Right common carotid artery disease present. 2. Right external carotid artery stenosis present. 3. Right internal carotid artery stenosis present in the 40% to 59%     range.  This may be underestimated due to dense plaque making     Doppler interrogation difficult. 4. Left internal carotid artery patent with history of endarterectomy.     No evidence of hyperplasia or stenosis is evident. 5. Bilateral vertebrals patent and antegrade. 6. Essentially unchanged from previous study on 09/21/2010.  ___________________________________________ Quita Skye. Hart Rochester, M.D.  SH/MEDQ  D:  04/08/2011  T:  04/08/2011  Job:  [id number]

## 2011-04-13 NOTE — Procedures (Unsigned)
AORTA-ILIAC DUPLEX EVALUATION  INDICATION:  Peripheral vascular disease.  HISTORY: Diabetes:  No Cardiac:  No Hypertension:  Yes Smoking:  Previous Previous Surgery:  Bilateral common iliac artery and left external iliac artery stent placement on 02/27/2010              SINGLE LEVEL ARTERIAL EXAM                             RIGHT                  LEFT Brachial: Anterior tibial: Posterior tibial: Peroneal: Ankle/brachial index:      0.42                   0.36 Previous ABI/date:         09/21/2010 0.54        09/21/2010 0.51  AORTA-ILIAC DUPLEX EXAM Aorta - Proximal     51 cm/s Aorta - Mid          54 cm/s Aorta - Distal       101 cm/s  RIGHT                                   LEFT 42-p/68-m cm/s    CIA-PROXIMAL          102 cm/s 128 cm/s          CIA-DISTAL            112 cm/s 113 cm/s          HYPOGASTRIC           Not visualized 152 cm/s          EIA-PROXIMAL          128 cm/s 267 cm/s          EIA-MID               253 cm/s 118 cm/s          EIA-DISTAL            161 cm/s  IMPRESSION: 1. Patent abdominal aorta with heterogenous plaque noted throughout. 2. Normal __________ flow identified at the proximal right common     iliac artery with vessel calcification present. 3. Elevated velocity of the mid right external iliac artery suggestive     of 50% to 75% stenosis with a peak systolic velocity of 267 cm/s. 4. Patent left common iliac artery, however, stent not well     visualized, no hemodynamically significant stenosis identified. 5. Left external iliac artery stent appears patent. 6. Left mid external iliac artery presents with elevated velocity     suggesting 50% to 75% stenosis with a peak systolic velocity of 253     cm/s. 7. Decrease in ankle brachial indices and increase in the disease     process since previous study on 09/21/2010. 8.  ___________________________________________ Quita Skye. Hart Rochester, M.D.  SH/MEDQ  D:  04/08/2011  T:   04/08/2011  Job:  [id number]

## 2011-06-07 ENCOUNTER — Other Ambulatory Visit: Payer: Self-pay

## 2011-06-07 DIAGNOSIS — M25512 Pain in left shoulder: Secondary | ICD-10-CM

## 2011-06-07 MED ORDER — HYDROCODONE-ACETAMINOPHEN 5-325 MG PO TABS: 1.0000 | ORAL_TABLET | Freq: Four times a day (QID) | ORAL | Status: DC | PRN

## 2011-06-07 NOTE — Telephone Encounter (Signed)
Done hardcopy to robin  

## 2011-06-07 NOTE — Telephone Encounter (Signed)
Faxed hardcopy to pharmacy. 

## 2011-07-12 ENCOUNTER — Other Ambulatory Visit: Payer: Self-pay

## 2011-07-12 MED ORDER — AMLODIPINE BESY-BENAZEPRIL HCL 10-40 MG PO CAPS: 1.0000 | ORAL_CAPSULE | Freq: Every day | ORAL | Status: DC

## 2011-07-12 MED ORDER — PENTOXIFYLLINE ER 400 MG PO TBCR: 400.0000 mg | EXTENDED_RELEASE_TABLET | Freq: Every day | ORAL | Status: DC

## 2011-07-12 MED ORDER — CLOPIDOGREL BISULFATE 75 MG PO TABS: 75.0000 mg | ORAL_TABLET | Freq: Every day | ORAL | Status: DC

## 2011-08-10 ENCOUNTER — Other Ambulatory Visit: Payer: Self-pay

## 2011-08-10 ENCOUNTER — Ambulatory Visit: Payer: Medicare Other | Admitting: Internal Medicine

## 2011-08-10 DIAGNOSIS — M25512 Pain in left shoulder: Secondary | ICD-10-CM

## 2011-08-10 MED ORDER — HYDROCODONE-ACETAMINOPHEN 5-325 MG PO TABS: 1.0000 | ORAL_TABLET | Freq: Four times a day (QID) | ORAL | Status: DC | PRN

## 2011-08-10 NOTE — Telephone Encounter (Signed)
Done hardcopy to robin  

## 2011-08-10 NOTE — Telephone Encounter (Signed)
Faxed hardcopy to pharmacy. 

## 2011-08-12 ENCOUNTER — Other Ambulatory Visit (INDEPENDENT_AMBULATORY_CARE_PROVIDER_SITE_OTHER): Payer: Medicare Other

## 2011-08-12 ENCOUNTER — Encounter: Payer: Self-pay | Admitting: Internal Medicine

## 2011-08-12 ENCOUNTER — Ambulatory Visit (INDEPENDENT_AMBULATORY_CARE_PROVIDER_SITE_OTHER): Payer: Medicare Other | Admitting: Internal Medicine

## 2011-08-12 VITALS — BP 132/70 | HR 41 | Temp 97.3°F | Ht 59.0 in | Wt 118.4 lb

## 2011-08-12 DIAGNOSIS — Z0000 Encounter for general adult medical examination without abnormal findings: Secondary | ICD-10-CM

## 2011-08-12 DIAGNOSIS — E119 Type 2 diabetes mellitus without complications: Secondary | ICD-10-CM

## 2011-08-12 DIAGNOSIS — I6529 Occlusion and stenosis of unspecified carotid artery: Secondary | ICD-10-CM

## 2011-08-12 LAB — BASIC METABOLIC PANEL
BUN: 27 mg/dL — ABNORMAL HIGH (ref 6–23)
Creatinine, Ser: 1.2 mg/dL (ref 0.4–1.2)
GFR: 54.73 mL/min — ABNORMAL LOW (ref >60.00)
Glucose, Bld: 114 mg/dL — ABNORMAL HIGH (ref 70–99)
Potassium: 4.2 mEq/L (ref 3.5–5.1)

## 2011-08-12 LAB — LIPID PANEL
Cholesterol: 143 mg/dL (ref 0–200)
HDL: 51.5 mg/dL (ref >39.00)
LDL Cholesterol: 58 mg/dL (ref 0–99)
Triglycerides: 166 mg/dL — ABNORMAL HIGH (ref 0.0–149.0)
VLDL: 33.2 mg/dL (ref 0.0–40.0)

## 2011-08-12 NOTE — Patient Instructions (Addendum)
Please remember to followup with your yearly mammogram [facility]) Please go to LAB in the Basement for the blood and/or urine tests to be done today You will be contacted by phone if any changes need to be made immediately.  Otherwise, you will receive a letter about your results with an explanation. You will be contacted regarding the referral for: carotid artery test Please return in 6 mo with Lab testing done 3-5 days before

## 2011-08-15 ENCOUNTER — Encounter: Payer: Self-pay | Admitting: Internal Medicine

## 2011-08-15 NOTE — Assessment & Plan Note (Signed)

## 2011-08-15 NOTE — Progress Notes (Signed)
Subjective:    Patient ID: Tracy [name], female    DOB: Aug 15, 1926, 78 y.o.   MRN: [id number]  HPI  Here for wellness and f/u;  Overall doing ok;  Pt denies CP, worsening SOB, DOE, wheezing, orthopnea, PND, worsening LE edema, palpitations, dizziness or syncope.  Pt denies neurological change such as new Headache, facial or extremity weakness.  Pt denies polydipsia, polyuria, or low sugar symptoms. Pt states overall good compliance with treatment and medications, good tolerability, and trying to follow lower cholesterol diet.  Pt denies worsening depressive symptoms, suicidal ideation or panic. No fever, wt loss, night sweats, loss of appetite, or other constitutional symptoms.  Pt states good ability with ADL's, low fall risk, home safety reviewed and adequate, no significant changes in hearing or vision, and occasionally active with exercise. Does have stiffness of the back but o/w remarkably vibrant today Past Medical History  Diagnosis Date  . Hyperlipidemia   . HTN (hypertension)   . GERD (gastroesophageal reflux disease)   . CVA (cerebrovascular accident) 2002  . Osteopenia   . Hx of adenomatous colonic polyps 2007  . Diverticulosis of colon   . DM (diabetes mellitus), type 2   . PVD (peripheral vascular disease)     recently dx w/ carotid -left  int carotid >90% 50-60% rt int carotid 02/2010  . PVD (peripheral vascular disease)     lf  ABI 0.11 and rt 0.43  . Bradycardia     asymptomatic  . BRADYCARDIA 05/29/2010  . CAROTID STENOSIS 03/10/2010  . CEREBROVASCULAR ACCIDENT, HX OF 12/25/2006  . COLONIC POLYPS, HX OF 08/01/2007  . CONSTIPATION 10/01/2008  . DIABETES MELLITUS, TYPE II 08/01/2007  . DIVERTICULOSIS, COLON 08/01/2007  . GERD 12/25/2006  . HYPERLIPIDEMIA 11/29/2006  . NECK MASS 10/01/2008  . OSTEOPENIA 12/25/2006  . PVD WITH CLAUDICATION 02/23/2010  . SHOULDER PAIN, RIGHT 08/01/2007  . SICK SINUS SYNDROME 03/10/2010   Past Surgical History  Procedure Date  . Cns vascular  stent per neurosurgeon   . Tubal ligation   . Right shoulder surgery 2010  . Cataract eye surgery   . Lazer eye surgery     reports that she has quit smoking. She does not have any smokeless tobacco history on file. She reports that she does not drink alcohol or use illicit drugs. family history includes Cancer in her mother. No Known Allergies Current Outpatient Prescriptions on File Prior to Visit  Medication Sig Dispense Refill  . ACTONEL 35 MG tablet TAKE 1 TABLET BY MOUTH WEEKLY  12 tablet  2  . amLODipine-benazepril (LOTREL) 10-40 MG per capsule Take 1 capsule by mouth daily.  90 capsule  1  . aspirin 81 MG tablet Take 81 mg by mouth daily.        . Calcium Carbonate (CALTRATE 600) 1500 MG TABS Take by mouth 2 (two) times daily.        . clopidogrel (PLAVIX) 75 MG tablet Take 1 tablet (75 mg total) by mouth daily.  90 tablet  1  . fenofibrate micronized (LOFIBRA) 134 MG capsule TAKE 1 CAPSULE BY MOUTH EVERY DAY  90 capsule  3  . HYDROcodone-acetaminophen (NORCO) 5-325 MG per tablet Take 1 tablet by mouth every 6 (six) hours as needed for pain.  60 tablet  0  . Multiple Vitamin (MULTIVITAMIN) capsule Take 1 capsule by mouth daily.        . pentoxifylline (TRENTAL) 400 MG CR tablet Take 1 tablet (400 mg total) by mouth daily.  90 tablet  1  . Probiotic Product (PHILLIPS COLON HEALTH) CAPS Take by mouth 2 (two) times daily.        . Wheat Dextrin (BENEFIBER) POWD Take by mouth 1 dose over 46 hours.         Review of Systems Review of Systems  Constitutional: Negative for diaphoresis, activity change, appetite change and unexpected weight change.  HENT: Negative for hearing loss, ear pain, facial swelling, mouth sores and neck stiffness.   Eyes: Negative for pain, redness and visual disturbance.  Respiratory: Negative for shortness of breath and wheezing.   Cardiovascular: Negative for chest pain and palpitations.  Gastrointestinal: Negative for diarrhea, blood in stool, abdominal  distention and rectal pain.  Genitourinary: Negative for hematuria, flank pain and decreased urine volume.  Musculoskeletal: Negative for myalgias and joint swelling.  Skin: Negative for color change and wound.  Neurological: Negative for syncope and numbness.  Hematological: Negative for adenopathy.  Psychiatric/Behavioral: Negative for hallucinations, self-injury, decreased concentration and agitation.      Objective:   Physical Exam BP 132/70  Pulse 41  Temp(Src) 97.3 F (36.3 C) (Oral)  Ht 4\' 11"  (1.499 m)  Wt 118 lb 6 oz (53.695 kg)  BMI 23.91 kg/m2  SpO2 97% Physical Exam  VS noted Constitutional: Pt is oriented to person, place, and time. Appears well-developed and well-nourished.  HENT:  Head: Normocephalic and atraumatic.  Right Ear: External ear normal.  Left Ear: External ear normal.  Nose: Nose normal.  Mouth/Throat: Oropharynx is clear and moist.  Eyes: Conjunctivae and EOM are normal. Pupils are equal, round, and reactive to light.  Neck: Normal range of motion. Neck supple. No JVD present. No tracheal deviation present.  Cardiovascular: Normal rate, regular rhythm, normal heart sounds and intact distal pulses.   Pulmonary/Chest: Effort normal and breath sounds normal.  Abdominal: Soft. Bowel sounds are normal. There is no tenderness.  Musculoskeletal: Normal range of motion. Exhibits no edema.  Lymphadenopathy:  Has no cervical adenopathy.  Neurological: Pt is alert and oriented to person, place, and time. Pt has normal reflexes. No cranial nerve deficit.  Skin: Skin is warm and dry. No rash noted.  Psychiatric:  Has  normal mood and affect. Behavior is normal.     Assessment & Plan:

## 2011-08-15 NOTE — Assessment & Plan Note (Signed)
stable overall by hx and exam, most recent data reviewed with pt, and pt to continue medical treatment as before Lab Results  Component Value Date   HGBA1C 6.0 08/12/2011    

## 2011-08-15 NOTE — Assessment & Plan Note (Signed)
Due for f/u  - for carotid doppler f/u

## 2011-08-25 ENCOUNTER — Other Ambulatory Visit: Payer: Self-pay | Admitting: Cardiology

## 2011-08-25 DIAGNOSIS — I6529 Occlusion and stenosis of unspecified carotid artery: Secondary | ICD-10-CM

## 2011-10-04 ENCOUNTER — Other Ambulatory Visit: Payer: Self-pay

## 2011-10-04 DIAGNOSIS — M25512 Pain in left shoulder: Secondary | ICD-10-CM

## 2011-10-04 MED ORDER — HYDROCODONE-ACETAMINOPHEN 5-325 MG PO TABS: 1.0000 | ORAL_TABLET | Freq: Four times a day (QID) | ORAL | Status: DC | PRN

## 2011-10-04 NOTE — Telephone Encounter (Signed)
Done hardcopy to robin  

## 2011-10-04 NOTE — Telephone Encounter (Signed)
Faxed hardcopy to pharmacy. 

## 2011-10-27 ENCOUNTER — Encounter: Payer: Self-pay | Admitting: Internal Medicine

## 2011-11-11 ENCOUNTER — Other Ambulatory Visit: Payer: Self-pay | Admitting: Internal Medicine

## 2011-12-17 ENCOUNTER — Encounter: Payer: Self-pay | Admitting: Internal Medicine

## 2011-12-17 ENCOUNTER — Telehealth: Payer: Self-pay | Admitting: Internal Medicine

## 2011-12-17 ENCOUNTER — Ambulatory Visit (INDEPENDENT_AMBULATORY_CARE_PROVIDER_SITE_OTHER): Payer: Medicare Other | Admitting: Internal Medicine

## 2011-12-17 VITALS — BP 100/60 | HR 64 | Temp 97.9°F | Resp 16

## 2011-12-17 DIAGNOSIS — M67919 Unspecified disorder of synovium and tendon, unspecified shoulder: Secondary | ICD-10-CM

## 2011-12-17 DIAGNOSIS — M25519 Pain in unspecified shoulder: Secondary | ICD-10-CM

## 2011-12-17 DIAGNOSIS — M7582 Other shoulder lesions, left shoulder: Secondary | ICD-10-CM

## 2011-12-17 DIAGNOSIS — M719 Bursopathy, unspecified: Secondary | ICD-10-CM

## 2011-12-17 DIAGNOSIS — M25512 Pain in left shoulder: Secondary | ICD-10-CM

## 2011-12-17 DIAGNOSIS — G4700 Insomnia, unspecified: Secondary | ICD-10-CM

## 2011-12-17 MED ORDER — DICLOFENAC EPOLAMINE 1.3 % TD PTCH: 1.0000 | MEDICATED_PATCH | Freq: Two times a day (BID) | TRANSDERMAL | Status: DC

## 2011-12-17 MED ORDER — ZOLPIDEM TARTRATE 5 MG PO TABS: 5.0000 mg | ORAL_TABLET | Freq: Every evening | ORAL | Status: DC | PRN

## 2011-12-17 NOTE — Telephone Encounter (Signed)
Noted will see today.    

## 2011-12-17 NOTE — Progress Notes (Signed)
Subjective:    Patient ID: Tracy [name], female    DOB: [birth year], 78 y.o.   MRN: [id number]  HPI Complains of pain in left shoulder Chronic symptoms for which she underwent a cortisone shot by orthopedics 3 weeks ago - symptoms were improved... However, yesterday, acute increase in left shoulder pain following flu vaccination at [facility]. Denies redness at site of injection, swelling or fever Pain radiates from her shoulder down the arm Unimproved with application of heat pad yesterday Unable to sleep last night because of pain in left shoulder  Past Medical History  Diagnosis Date  . Hyperlipidemia   . HTN (hypertension)   . GERD (gastroesophageal reflux disease)   . CVA (cerebrovascular accident) 2002  . Osteopenia   . Hx of adenomatous colonic polyps 2007  . Diverticulosis of colon   . DM (diabetes mellitus), type 2   . PVD (peripheral vascular disease)     recently dx w/ carotid -left  int carotid >90% 50-60% rt int carotid 02/2010  . PVD (peripheral vascular disease)     lf  ABI 0.11 and rt 0.43  . Bradycardia     asymptomatic  . BRADYCARDIA   . CAROTID STENOSIS   . DIABETES MELLITUS, TYPE II   . DIVERTICULOSIS, COLON   . GERD   . HYPERLIPIDEMIA   . OSTEOPENIA     Review of Systems  Constitutional: Negative for fever, chills, fatigue and unexpected weight change.  Cardiovascular: Negative for chest pain, palpitations and leg swelling.  Musculoskeletal: Positive for myalgias.  Neurological: Negative for dizziness and headaches.       Objective:   Physical Exam BP 100/60  Pulse 64  Temp 97.9 F (36.6 C) (Oral)  Resp 16 Wt Readings from Last 3 Encounters:  08/12/11 118 lb 6 oz (53.695 kg)  02/09/11 120 lb 4 oz (54.545 kg)  10/08/10 118 lb (53.524 kg)   Gen: Uncomfortable but nontoxic. Family at side MSkel: L Shoulder: No obvious deformities. Evidence of flu immunization given high on shoulder over the rotator cuff. Acute on chronic decreased  range of motion. Reluctance to stressing of rotator cuff because of pain. Limited internal rotation and abduction. Positive impingement signs. Pain elicited with overhead activities. Neuro vascularly intact distally. Skin: No erythema, bruising. No abnormal warmth to touch  Lab Results  Component Value Date   WBC 7.9 08/04/2010   HGB 13.2 08/04/2010   HCT 39.8 08/04/2010   PLT 379.0 08/04/2010   GLUCOSE 114* 08/12/2011   CHOL 143 08/12/2011   TRIG 166.0* 08/12/2011   HDL 51.50 08/12/2011   LDLCALC 58 08/12/2011   ALT 10 08/04/2010   AST 19 08/04/2010   NA 142 08/12/2011   K 4.2 08/12/2011   CL 102 08/12/2011   CREATININE 1.2 08/12/2011   BUN 27* 08/12/2011   CO2 30 08/12/2011   TSH 2.01 08/04/2010   INR 1.04 04/28/2010   HGBA1C 6.0 08/12/2011   MICROALBUR 5.6* 08/04/2010        Assessment & Plan:  Acute and chronic left shoulder pain History of rotator cuff bursitis status post steroid injection by orthopedics 3 weeks ago - Acute insomnia because of pain  Was improving until flu shot administration at pharmacy in last 24 hours -injection into rotator cuff now with exacerbation of pain symptoms  Use topical flector patch as needed -samples provided and prescription submitted Ice to shoulder, okay to use Norco as needed as prior to flu shot The patient is reassured that these  symptoms do not appear to represent a serious or threatening condition. Followup with orthopedics next week if symptoms still unimproved Ambien when necessary insomnia -prescription done

## 2011-12-17 NOTE — Patient Instructions (Signed)
It was good to see you today. The flu shot administration has exacerbated your rotator cuff tendinitis Use ice to the painful shoulder 3 or 4 times a day, 10 minutes at a time for the next 3 days Also use flector patch to painful shoulder area, changed every 12 hours as needed 4 patch samples given today and Your prescription(s) have been submitted to your pharmacy. Please take as directed and contact our office if you believe you are having problem(s) with the medication(s). Other medications reviewed, no additional changes Followup with Dr. Madelon Lips orthopedics next week if symptoms still unimproved or worse

## 2011-12-17 NOTE — Telephone Encounter (Signed)
Caller: [name]; Patient Name: Casilda Carls; PCP: Oliver Barre (Adults only); Best Callback Phone Number: [phone]; Reason for call: Patient received influenza injection at pharmacy on 12/16/11, approximately 11:00am. Caller c/o pain to left arm where patient received injection. Onset: yesterday evening 09/12. Pain kept patient awake overnight. " Pain going all the way down her shoulder down to her hand"  .  Denies redness or swelling to site. .Hydrocodone taken last night , 09/12, and 11:15am today, 09/13. Med not "helping much".  Pain currently rated as a 9-9.5.  Son states patient has to take her right hand to move left arm where shot given. All emergent symptoms ruled out per Abrasions , Laceration , Puncture Wounds with exception " unbearable pain". Appointment scheduled for patient to see Dr. Felicity Coyer at 2pm today(09/13) in [facility].

## 2011-12-24 ENCOUNTER — Encounter: Payer: Self-pay | Admitting: Gastroenterology

## 2011-12-28 ENCOUNTER — Encounter: Payer: Self-pay | Admitting: Gastroenterology

## 2011-12-29 ENCOUNTER — Other Ambulatory Visit: Payer: Self-pay

## 2011-12-29 DIAGNOSIS — M25512 Pain in left shoulder: Secondary | ICD-10-CM

## 2011-12-29 MED ORDER — HYDROCODONE-ACETAMINOPHEN 5-325 MG PO TABS: 1.0000 | ORAL_TABLET | Freq: Four times a day (QID) | ORAL | Status: DC | PRN

## 2011-12-29 NOTE — Telephone Encounter (Signed)
Faxed hardcopy to pharmacy. 

## 2011-12-29 NOTE — Telephone Encounter (Signed)
Done hardcopy to robin  

## 2012-01-05 ENCOUNTER — Other Ambulatory Visit: Payer: Self-pay | Admitting: Internal Medicine

## 2012-01-06 ENCOUNTER — Encounter: Payer: Self-pay | Admitting: Vascular Surgery

## 2012-01-19 ENCOUNTER — Other Ambulatory Visit: Payer: Self-pay | Admitting: *Deleted

## 2012-01-19 DIAGNOSIS — I739 Peripheral vascular disease, unspecified: Secondary | ICD-10-CM

## 2012-01-19 DIAGNOSIS — Z48812 Encounter for surgical aftercare following surgery on the circulatory system: Secondary | ICD-10-CM

## 2012-01-21 ENCOUNTER — Telehealth: Payer: Self-pay | Admitting: *Deleted

## 2012-01-21 NOTE — Telephone Encounter (Signed)
Not needed

## 2012-01-21 NOTE — Telephone Encounter (Signed)
Pt had a COLON in 2011 showing diverticulosis and rectal bx showing :  FINAL DIAGNOSIS  1. Rectum, biopsy, : - ERODED, REACTIVE AND REGENERATIVE RECTAL MUCOSA WITH CRYPT DISTORTION, LAMINA PROPRIA EDEMA, MILD INFLAMMATION, AND INFLAMMATORY EXUDATE; SEE COMMENT. - NEGATIVE FOR DYSPLASIA OR MALIGNANCY.  Pt received a recall letter and scheduled an appt for next week. Called pt and she reports no other problems. Dr Jarold Motto, does pt need the visit to discuss a COLON at age 78? Thanks.

## 2012-01-21 NOTE — Telephone Encounter (Signed)
Informed pt that unless she is having any rectal bleeding, cramping, or a change in bowel habits, she does not need to be seen or repeat a COLON at this time. Pt stated understanding.

## 2012-01-25 ENCOUNTER — Encounter: Payer: Self-pay | Admitting: Neurosurgery

## 2012-01-25 ENCOUNTER — Ambulatory Visit: Payer: Medicare Other | Admitting: Gastroenterology

## 2012-01-26 ENCOUNTER — Other Ambulatory Visit: Payer: Medicare Other

## 2012-01-26 ENCOUNTER — Other Ambulatory Visit (INDEPENDENT_AMBULATORY_CARE_PROVIDER_SITE_OTHER): Payer: Medicare Other | Admitting: *Deleted

## 2012-01-26 ENCOUNTER — Ambulatory Visit (INDEPENDENT_AMBULATORY_CARE_PROVIDER_SITE_OTHER): Payer: Medicare Other | Admitting: Neurosurgery

## 2012-01-26 ENCOUNTER — Encounter (INDEPENDENT_AMBULATORY_CARE_PROVIDER_SITE_OTHER): Payer: Medicare Other | Admitting: *Deleted

## 2012-01-26 ENCOUNTER — Encounter: Payer: Self-pay | Admitting: Neurosurgery

## 2012-01-26 VITALS — BP 145/70 | HR 53 | Resp 14 | Ht 59.0 in | Wt 115.0 lb

## 2012-01-26 DIAGNOSIS — Z48812 Encounter for surgical aftercare following surgery on the circulatory system: Secondary | ICD-10-CM

## 2012-01-26 DIAGNOSIS — I6529 Occlusion and stenosis of unspecified carotid artery: Secondary | ICD-10-CM

## 2012-01-26 DIAGNOSIS — I739 Peripheral vascular disease, unspecified: Secondary | ICD-10-CM

## 2012-01-26 DIAGNOSIS — I723 Aneurysm of iliac artery: Secondary | ICD-10-CM | POA: Insufficient documentation

## 2012-01-26 NOTE — Progress Notes (Signed)
[facility] AortaIliac/PAD/PVD Office Note  CC: Aortic iliac duplex Referring Physician: Hart Rochester  History of Present Illness: 78 year old female patient of Dr. Hart Rochester status post bilateral common iliac and left external iliac stents in 2011. The patient denies claudication, rest pain or open ulcerations. The patient also denies any signs or symptoms of CVA, TIA, amaurosis fugax or any neural deficit.  Past Medical History  Diagnosis Date  . Hyperlipidemia   . HTN (hypertension)   . GERD (gastroesophageal reflux disease)   . CVA (cerebrovascular accident) 2002  . Osteopenia   . Hx of adenomatous colonic polyps 2007  . Diverticulosis of colon   . DM (diabetes mellitus), type 2   . PVD (peripheral vascular disease)     recently dx w/ carotid -left  int carotid >90% 50-60% rt int carotid 02/2010  . PVD (peripheral vascular disease)     lf  ABI 0.11 and rt 0.43  . Bradycardia     asymptomatic  . BRADYCARDIA   . CAROTID STENOSIS   . DIABETES MELLITUS, TYPE II   . DIVERTICULOSIS, COLON   . GERD   . HYPERLIPIDEMIA   . OSTEOPENIA     ROS: [x]  Positive   [ ]  Denies    General: [ ]  Weight loss, [ ]  Fever, [ ]  chills Neurologic: [ ]  Dizziness, [ ]  Blackouts, [ ]  Seizure [ ]  Stroke, [ ]  "Mini stroke", [ ]  Slurred speech, [ ]  Temporary blindness; [ ]  weakness in arms or legs, [ ]  Hoarseness Cardiac: [ ]  Chest pain/pressure, [ ]  Shortness of breath at rest [ ]  Shortness of breath with exertion, [ ]  Atrial fibrillation or irregular heartbeat Vascular: [ ]  Pain in legs with walking, [ ]  Pain in legs at rest, [ ]  Pain in legs at night,  [ ]  Non-healing ulcer, [ ]  Blood clot in vein/DVT,   Pulmonary: [ ]  Home oxygen, [ ]  Productive cough, [ ]  Coughing up blood, [ ]  Asthma,  [ ]  Wheezing Musculoskeletal:  [ ]  Arthritis, [ ]  Low back pain, [ ]  Joint pain Hematologic: [ ]  Easy Bruising, [ ]  Anemia; [ ]  Hepatitis Gastrointestinal: [ ]  Blood in stool, [ ]   Gastroesophageal Reflux/heartburn, [ ]  Trouble swallowing Urinary: [ ]  chronic Kidney disease, [ ]  on HD - [ ]  MWF or [ ]  TTHS, [ ]  Burning with urination, [ ]  Difficulty urinating Skin: [ ]  Rashes, [ ]  Wounds Psychological: [ ]  Anxiety, [ ]  Depression   Social History History  Substance Use Topics  . Smoking status: Former Games developer  . Smokeless tobacco: Not on file  . Alcohol Use: No    Family History Family History  Problem Relation Age of Onset  . Cancer Mother     colon    No Known Allergies  Current Outpatient Prescriptions  Medication Sig Dispense Refill  . ACTONEL 35 MG tablet TAKE 1 TABLET BY MOUTH WEEKLY  12 tablet  2  . amLODipine-benazepril (LOTREL) 10-40 MG per capsule TAKE 1 CAPSULE BY MOUTH DAILY.  90 capsule  1  . aspirin 81 MG tablet Take 81 mg by mouth daily.        . Calcium Carbonate (CALTRATE 600) 1500 MG TABS Take by mouth 2 (two) times daily.        . clopidogrel (PLAVIX) 75 MG tablet Take 1 tablet (75 mg total) by mouth daily.  90 tablet  1  . fenofibrate micronized (LOFIBRA) 134 MG capsule TAKE 1 CAPSULE BY MOUTH  EVERY DAY  90 capsule  3  . HYDROcodone-acetaminophen (NORCO) 5-325 MG per tablet Take 1 tablet by mouth every 6 (six) hours as needed for pain.  60 tablet  1  . Multiple Vitamin (MULTIVITAMIN) capsule Take 1 capsule by mouth daily.        . pentoxifylline (TRENTAL) 400 MG CR tablet Take 1 tablet (400 mg total) by mouth daily.  90 tablet  1  . diclofenac (FLECTOR) 1.3 % PTCH Place 1 patch onto the skin 2 (two) times daily.  5 patch  0  . Probiotic Product (PHILLIPS COLON HEALTH) CAPS Take by mouth 2 (two) times daily.        . Wheat Dextrin (BENEFIBER) POWD Take by mouth 1 dose over 46 hours.        Marland Kitchen zolpidem (AMBIEN) 5 MG tablet Take 1 tablet (5 mg total) by mouth at bedtime as needed for sleep.  10 tablet  1    Physical Examination  Filed Vitals:   01/26/12 1106  BP: 145/70  Pulse: 53  Resp: 14    Body mass index is 23.23  kg/(m^2).  General:  WDWN in NAD Gait: Normal HEENT: WNL Eyes: Pupils equal Pulmonary: normal non-labored breathing , without Rales, rhonchi,  wheezing Cardiac: RRR, without  Murmurs, rubs or gallops; No carotid bruits Abdomen: soft, NT, no masses Skin: no rashes, ulcers noted Vascular Exam/Pulses: Lower extremity pulses are not palpable, femoral pulses are palpable, there are no carotid bruits heard  Extremities without ischemic changes, no Gangrene , no cellulitis; no open wounds;  Musculoskeletal: no muscle wasting or atrophy  Neurologic: A&O X 3; Appropriate Affect ; SENSATION: normal; MOTOR FUNCTION:  moving all extremities equally. Speech is fluent/normal  Non-Invasive Vascular Imaging: ABIs today which were reviewed with Dr. Edilia Bo showed a slight improvement of 0.53 and monophasic on the right, 0.42 and biphasic to monophasic on the left compared to previous ABIs January 2013 when she was 0.42 on the right, 36 on the left. Doppler velocity suggests greater than 50% stenosis on the right proximal left mid internal iliac arteries the stents were inadequately visualized due to overlying bowel gas  ASSESSMENT/PLAN: Asymptomatic patient followed for multiple vascular difficulties. The patient will followup in 6 months with repeat ABIs and aortoiliac duplex as well as carotid duplex. The patient's questions were encouraged and answered, she is in agreement with this plan.  Lauree Chandler ANP  Clinic M.D.: Edilia Bo

## 2012-01-27 ENCOUNTER — Encounter: Payer: Self-pay | Admitting: Neurosurgery

## 2012-01-27 NOTE — Addendum Note (Signed)
Addended by: Sharee Pimple on: 01/27/2012 02:47 PM   Modules accepted: Orders

## 2012-02-05 ENCOUNTER — Other Ambulatory Visit: Payer: Self-pay | Admitting: Internal Medicine

## 2012-02-15 ENCOUNTER — Other Ambulatory Visit (INDEPENDENT_AMBULATORY_CARE_PROVIDER_SITE_OTHER): Payer: Medicare Other

## 2012-02-15 ENCOUNTER — Encounter: Payer: Self-pay | Admitting: Internal Medicine

## 2012-02-15 ENCOUNTER — Ambulatory Visit (INDEPENDENT_AMBULATORY_CARE_PROVIDER_SITE_OTHER): Payer: Medicare Other | Admitting: Internal Medicine

## 2012-02-15 VITALS — BP 132/80 | HR 49 | Temp 96.8°F | Ht 59.0 in | Wt 118.2 lb

## 2012-02-15 DIAGNOSIS — R5383 Other fatigue: Secondary | ICD-10-CM

## 2012-02-15 DIAGNOSIS — R5381 Other malaise: Secondary | ICD-10-CM

## 2012-02-15 DIAGNOSIS — I10 Essential (primary) hypertension: Secondary | ICD-10-CM

## 2012-02-15 DIAGNOSIS — R9431 Abnormal electrocardiogram [ECG] [EKG]: Secondary | ICD-10-CM

## 2012-02-15 DIAGNOSIS — I495 Sick sinus syndrome: Secondary | ICD-10-CM

## 2012-02-15 DIAGNOSIS — E785 Hyperlipidemia, unspecified: Secondary | ICD-10-CM

## 2012-02-15 LAB — CBC WITH DIFFERENTIAL/PLATELET
Basophils Absolute: 0 10*3/uL (ref 0.0–0.1)
Eosinophils Absolute: 0.1 10*3/uL (ref 0.0–0.7)
Lymphocytes Relative: 18.1 % (ref 12.0–46.0)
MCHC: 33 g/dL (ref 30.0–36.0)
Neutrophils Relative %: 70.1 % (ref 43.0–77.0)
Platelets: 377 10*3/uL (ref 150.0–400.0)
RDW: 14.2 % (ref 11.5–14.6)

## 2012-02-15 LAB — HEPATIC FUNCTION PANEL
ALT: 14 U/L (ref 0–35)
Alkaline Phosphatase: 32 U/L — ABNORMAL LOW (ref 39–117)
Bilirubin, Direct: 0.1 mg/dL (ref 0.0–0.3)
Total Protein: 7.4 g/dL (ref 6.0–8.3)

## 2012-02-15 LAB — URINALYSIS, ROUTINE W REFLEX MICROSCOPIC
Hgb urine dipstick: NEGATIVE
Urine Glucose: NEGATIVE
Urobilinogen, UA: 0.2 (ref 0.0–1.0)

## 2012-02-15 LAB — LIPID PANEL
Total CHOL/HDL Ratio: 3
Triglycerides: 141 mg/dL (ref 0.0–149.0)

## 2012-02-15 LAB — BASIC METABOLIC PANEL
CO2: 31 mEq/L (ref 19–32)
Chloride: 101 mEq/L (ref 96–112)
Glucose, Bld: 106 mg/dL — ABNORMAL HIGH (ref 70–99)
Sodium: 141 mEq/L (ref 135–145)

## 2012-02-15 LAB — TSH: TSH: 1.68 u[IU]/mL (ref 0.35–5.50)

## 2012-02-15 NOTE — Assessment & Plan Note (Signed)
stable overall by hx and exam, most recent data reviewed with pt, and pt to continue medical treatment as before BP Readings from Last 3 Encounters:  02/15/12 132/80  01/26/12 145/70  12/17/11 100/60

## 2012-02-15 NOTE — Progress Notes (Signed)
Subjective:    Patient ID: Tracy [name], female    DOB: 22-May-1926, 78 y.o.   MRN: [id number]  HPI  Here with 3-6 mo worsening exercise intolerance with CC of general fatigue and bilat leg weakness, was walking 5 mile just 2 yrs ago, quit bowling may 2013 due to shoulder pain, now with exertional intolerance to walking room to room.  Pt denies chest pain, wheezing, orthopnea, PND, increased LE swelling, palpitations, dizziness or syncope. Pt denies new neurological symptoms such as new headache, or facial or extremity weakness or numbness   Pt denies polydipsia, polyuria.   Has known SSS, has not accepted pacemaker to date.  No falls.  Overall pain not an issue, no signficant pain.  Did have recent flu shot. Denies worsening depressive symptoms, suicidal ideation, or panic, though has ongoing anxiety.  Pt denies fever, wt loss, night sweats, loss of appetite, or other constitutional symptoms  Overall good compliance with treatment, and good medicine tolerability. Past Medical History  Diagnosis Date  . Hyperlipidemia   . HTN (hypertension)   . GERD (gastroesophageal reflux disease)   . CVA (cerebrovascular accident) 2002  . Osteopenia   . Hx of adenomatous colonic polyps 2007  . Diverticulosis of colon   . DM (diabetes mellitus), type 2   . PVD (peripheral vascular disease)     recently dx w/ carotid -left  int carotid >90% 50-60% rt int carotid 02/2010  . PVD (peripheral vascular disease)     lf  ABI 0.11 and rt 0.43  . Bradycardia     asymptomatic  . BRADYCARDIA   . CAROTID STENOSIS   . DIABETES MELLITUS, TYPE II   . DIVERTICULOSIS, COLON   . GERD   . HYPERLIPIDEMIA   . OSTEOPENIA   . Atrial fibrillation   . DVT (deep venous thrombosis)    Past Surgical History  Procedure Date  . Cns vascular stent per neurosurgeon   . Tubal ligation   . Right shoulder surgery 2010  . Cataract eye surgery   . Lazer eye surgery   . Iliac artery stent 2011    Bilateral CIA stent    reports that she quit smoking about 20 years ago. She does not have any smokeless tobacco history on file. She reports that she does not drink alcohol or use illicit drugs. family history includes Cancer in her mother and Diabetes in her sister. No Known Allergies Current Outpatient Prescriptions on File Prior to Visit  Medication Sig Dispense Refill  . ACTONEL 35 MG tablet TAKE 1 TABLET BY MOUTH WEEKLY  12 tablet  2  . amLODipine-benazepril (LOTREL) 10-40 MG per capsule TAKE 1 CAPSULE BY MOUTH DAILY.  90 capsule  1  . aspirin 81 MG tablet Take 81 mg by mouth daily.        . Calcium Carbonate (CALTRATE 600) 1500 MG TABS Take by mouth 2 (two) times daily.        . clopidogrel (PLAVIX) 75 MG tablet Take 1 tablet (75 mg total) by mouth daily.  90 tablet  1  . diclofenac (FLECTOR) 1.3 % PTCH Place 1 patch onto the skin 2 (two) times daily.  5 patch  0  . fenofibrate micronized (LOFIBRA) 134 MG capsule TAKE 1 CAPSULE BY MOUTH EVERY DAY  90 capsule  2  . HYDROcodone-acetaminophen (NORCO) 5-325 MG per tablet Take 1 tablet by mouth every 6 (six) hours as needed for pain.  60 tablet  1  . Multiple Vitamin (MULTIVITAMIN) capsule  Take 1 capsule by mouth daily.        . pentoxifylline (TRENTAL) 400 MG CR tablet Take 1 tablet (400 mg total) by mouth daily.  90 tablet  1  . Probiotic Product (PHILLIPS COLON HEALTH) CAPS Take by mouth 2 (two) times daily.        . Wheat Dextrin (BENEFIBER) POWD Take by mouth 1 dose over 46 hours.        Marland Kitchen zolpidem (AMBIEN) 5 MG tablet Take 1 tablet (5 mg total) by mouth at bedtime as needed for sleep.  10 tablet  1   Review of Systems  Constitutional: Negative for diaphoresis and unexpected weight change.  HENT: Negative for tinnitus.   Eyes: Negative for photophobia and visual disturbance.  Respiratory: Negative for choking and stridor.   Gastrointestinal: Negative for vomiting and blood in stool.  Genitourinary: Negative for hematuria and decreased urine volume.    Musculoskeletal: Negative for gait problem.  Skin: Negative for color change and wound.  Neurological: Negative for tremors and numbness.  Psychiatric/Behavioral: Negative for decreased concentration. The patient is not hyperactive.       Objective:   Physical Exam BP 132/80  Pulse 49  Temp 96.8 F (36 C) (Oral)  Ht 4\' 11"  (1.499 m)  Wt 118 lb 4 oz (53.638 kg)  BMI 23.88 kg/m2  SpO2 97% Physical Exam  VS noted Constitutional: Pt appears well-developed and well-nourished.  HENT: Head: Normocephalic.  Right Ear: External ear normal.  Left Ear: External ear normal.  Eyes: Conjunctivae and EOM are normal. Pupils are equal, round, and reactive to light.  Neck: Normal range of motion. Neck supple.  Cardiovascular: Normal rate and regular rhythm.  but slow Pulmonary/Chest: Effort normal and breath sounds normal.  Abd:  Soft, NT, non-distended, + BS Neurological: Pt is alert. Not confused , motor intact Skin: Skin is warm. No erythema.  Psychiatric: Pt behavior is normal. Thought content normal.     Assessment & Plan:

## 2012-02-15 NOTE — Patient Instructions (Addendum)
Continue all other medications as before Your EKG was OK today except the Heart Rate was very Slow Please go to XRAY in the Basement for the x-ray test Please go to LAB in the Basement for the blood and/or urine tests to be done today Please remember to sign up for My Chart at your earliest convenience, as this will be important to you in the future with finding out test results. You will be contacted regarding the referral for: cardiology - Dr Johney Frame Please return in 3 months, or sooner if needed

## 2012-02-15 NOTE — Assessment & Plan Note (Addendum)
With reduction in ambulatory tolerance in the past 6 months more than I would expect with geriatric decline;  By exam most likely cardiac related I supsect but cant completely r/o pulm or other such as anemia, ECG reviewed as per emr, also for cxr, labs, echo and to f/u with Dr Johney Frame (who has previously mentioned need for pacemaker to her per pt)  Note:  Total time for pt hx, exam, review of record with pt in the room, determination of diagnoses and plan for further eval and tx is > 40 min, with over 50% spent in coordination and counseling of patient

## 2012-02-15 NOTE — Assessment & Plan Note (Signed)
ECG reviewed as per emr, to refer back to Dr Allred/card

## 2012-02-23 ENCOUNTER — Other Ambulatory Visit (HOSPITAL_COMMUNITY): Payer: Medicare Other

## 2012-03-16 ENCOUNTER — Encounter: Payer: Self-pay | Admitting: Internal Medicine

## 2012-03-16 ENCOUNTER — Ambulatory Visit (INDEPENDENT_AMBULATORY_CARE_PROVIDER_SITE_OTHER): Payer: Medicare Other | Admitting: Internal Medicine

## 2012-03-16 VITALS — BP 106/68 | HR 50 | Ht 59.0 in | Wt 118.0 lb

## 2012-03-16 DIAGNOSIS — I498 Other specified cardiac arrhythmias: Secondary | ICD-10-CM

## 2012-03-16 DIAGNOSIS — R5381 Other malaise: Secondary | ICD-10-CM

## 2012-03-16 DIAGNOSIS — I495 Sick sinus syndrome: Secondary | ICD-10-CM

## 2012-03-16 DIAGNOSIS — R5383 Other fatigue: Secondary | ICD-10-CM

## 2012-03-16 DIAGNOSIS — I10 Essential (primary) hypertension: Secondary | ICD-10-CM

## 2012-03-16 NOTE — Assessment & Plan Note (Signed)
Possibly related to bradycardia (as above) Will obtain an echo.  If not significantly changed, will continue current medical treatment approach She denies CP or SOB and is not willing to proceed with stress testing at this time.

## 2012-03-16 NOTE — Assessment & Plan Note (Signed)
Stable No change required today  

## 2012-03-16 NOTE — Progress Notes (Signed)
PCP:  Oliver Barre, MD  The patient presents today for electrophysiology followup. She was previously seen by me in consultation for bradycardia.  Her bradycardia has been longstanding but minimally symptomatic.  She has previously been quite active despite her age and until recently continued to walk several miles per day and bowl. Recently, it appears that she is becoming less active.  She is no longer bowling.  She also does not walk regularly and reports fatigue with moderate exertion. Today, she denies symptoms of palpitations, chest pain, shortness of breath, orthopnea, PND, lower extremity edema, dizziness, presyncope, syncope, or neurologic sequela.  She feels that her quality of life is preserved and is not ready to consider pacemaker implantation.  The patient feels that she is tolerating medications without difficulties and is otherwise without complaint today.   Past Medical History  Diagnosis Date  . Hyperlipidemia   . HTN (hypertension)   . GERD (gastroesophageal reflux disease)   . CVA (cerebrovascular accident) 2002  . Osteopenia   . Hx of adenomatous colonic polyps 2007  . Diverticulosis of colon   . DM (diabetes mellitus), type 2   . PVD (peripheral vascular disease)     recently dx w/ carotid -left  int carotid >90% 50-60% rt int carotid 02/2010  . PVD (peripheral vascular disease)     lf  ABI 0.11 and rt 0.43  . CAROTID STENOSIS   . DIABETES MELLITUS, TYPE II   . DIVERTICULOSIS, COLON   . GERD   . HYPERLIPIDEMIA   . OSTEOPENIA   . Atrial fibrillation   . DVT (deep venous thrombosis)   . Sinus node dysfunction 2011   Past Surgical History  Procedure Date  . Cns vascular stent per neurosurgeon   . Tubal ligation   . Right shoulder surgery 2010  . Cataract eye surgery   . Lazer eye surgery   . Iliac artery stent 2011    Bilateral CIA stent    Current Outpatient Prescriptions  Medication Sig Dispense Refill  . ACTONEL 35 MG tablet TAKE 1 TABLET BY MOUTH  WEEKLY  12 tablet  2  . amLODipine-benazepril (LOTREL) 10-40 MG per capsule TAKE 1 CAPSULE BY MOUTH DAILY.  90 capsule  1  . aspirin 81 MG tablet Take 81 mg by mouth daily.        . Calcium Carbonate (CALTRATE 600) 1500 MG TABS Take by mouth 2 (two) times daily.        . clopidogrel (PLAVIX) 75 MG tablet Take 1 tablet (75 mg total) by mouth daily.  90 tablet  1  . fenofibrate micronized (LOFIBRA) 134 MG capsule TAKE 1 CAPSULE BY MOUTH EVERY DAY  90 capsule  2  . HYDROcodone-acetaminophen (NORCO) 5-325 MG per tablet Take 1 tablet by mouth every 6 (six) hours as needed for pain.  60 tablet  1  . Multiple Vitamin (MULTIVITAMIN) capsule Take 1 capsule by mouth daily.        . pentoxifylline (TRENTAL) 400 MG CR tablet Take 1 tablet (400 mg total) by mouth daily.  90 tablet  1  . Probiotic Product (PHILLIPS COLON HEALTH) CAPS Take by mouth 2 (two) times daily.        . Wheat Dextrin (BENEFIBER) POWD Take by mouth 1 dose over 46 hours.          No Known Allergies  History   Social History  . Marital Status: Widowed    Spouse Name: N/A    Number of  Children: 5  . Years of Education: N/A   Occupational History  . retired Ship broker   Social History Main Topics  . Smoking status: Former Smoker    Quit date: 04/06/1991  . Smokeless tobacco: Not on file  . Alcohol Use: No  . Drug Use: No  . Sexually Active: Not on file   Other Topics Concern  . Not on file   Social History Narrative  . No narrative on file    Family History  Problem Relation Age of Onset  . Cancer Mother     colon  . Diabetes Sister     Amputation    ROS-  All systems are reviewed and are negative except as outlined in the HPI above   Physical Exam: Filed Vitals:   03/16/12 1452  BP: 106/68  Pulse: 50  Height: 4\' 11"  (1.499 m)  Weight: 118 lb (53.524 kg)    GEN- The patient is well appearing, alert and oriented x 3 today.   Head- normocephalic, atraumatic Eyes-  Sclera clear, conjunctiva pink Ears-  hearing intact Oropharynx- clear Neck- supple,   Lungs- Clear to ausculation bilaterally, normal work of breathing Heart- bradycardic regular rhythm, no murmurs, rubs or gallops, PMI not laterally displaced GI- soft, NT, ND, + BS Extremities- no clubbing, cyanosis, or edema MS- no significant deformity or atrophy Skin- no rash or lesion Psych- euthymic mood, full affect Neuro- strength and sensation are intact  ekg today reveals sinus bradycardia with intermittent junctional rhythm (45 bpm), LVH, repolarization abnormality, PR 172, QRS 118 msec, Qtc 410 msec  Assessment and Plan:

## 2012-03-16 NOTE — Assessment & Plan Note (Signed)
The patient has chronic sinus node dysfunction.  She has previously been minimally symptomatic.  Recently, she has noticed more fatigue.  She denies presyncope or syncope.  She remains quite certain that she would not want to proceed with PPM implantation when this is discussed today.  She would like to continue a conservative approach at this point.  She states that she will contact me immediately should she develop progressive symptoms, dizziness, presyncope, or syncope.

## 2012-03-16 NOTE — Patient Instructions (Addendum)
Your physician wants you to follow-up in: 6 months with Dr Allred You will receive a reminder letter in the mail two months in advance. If you don't receive a letter, please call our office to schedule the follow-up appointment.   Your physician has requested that you have an echocardiogram. Echocardiography is a painless test that uses sound waves to create images of your heart. It provides your doctor with information about the size and shape of your heart and how well your heart's chambers and valves are working. This procedure takes approximately one hour. There are no restrictions for this procedure.    

## 2012-03-22 ENCOUNTER — Ambulatory Visit (HOSPITAL_COMMUNITY): Payer: Medicare Other | Attending: Internal Medicine

## 2012-03-22 DIAGNOSIS — K219 Gastro-esophageal reflux disease without esophagitis: Secondary | ICD-10-CM | POA: Insufficient documentation

## 2012-03-22 DIAGNOSIS — Z8673 Personal history of transient ischemic attack (TIA), and cerebral infarction without residual deficits: Secondary | ICD-10-CM | POA: Insufficient documentation

## 2012-03-22 DIAGNOSIS — E785 Hyperlipidemia, unspecified: Secondary | ICD-10-CM | POA: Insufficient documentation

## 2012-03-22 DIAGNOSIS — I10 Essential (primary) hypertension: Secondary | ICD-10-CM | POA: Insufficient documentation

## 2012-03-22 DIAGNOSIS — R5381 Other malaise: Secondary | ICD-10-CM | POA: Insufficient documentation

## 2012-03-22 DIAGNOSIS — I739 Peripheral vascular disease, unspecified: Secondary | ICD-10-CM | POA: Insufficient documentation

## 2012-03-22 DIAGNOSIS — I059 Rheumatic mitral valve disease, unspecified: Secondary | ICD-10-CM

## 2012-03-22 DIAGNOSIS — E119 Type 2 diabetes mellitus without complications: Secondary | ICD-10-CM | POA: Insufficient documentation

## 2012-03-22 DIAGNOSIS — I82409 Acute embolism and thrombosis of unspecified deep veins of unspecified lower extremity: Secondary | ICD-10-CM | POA: Insufficient documentation

## 2012-03-22 DIAGNOSIS — Z87891 Personal history of nicotine dependence: Secondary | ICD-10-CM | POA: Insufficient documentation

## 2012-03-22 DIAGNOSIS — I6529 Occlusion and stenosis of unspecified carotid artery: Secondary | ICD-10-CM | POA: Insufficient documentation

## 2012-03-22 DIAGNOSIS — I4891 Unspecified atrial fibrillation: Secondary | ICD-10-CM | POA: Insufficient documentation

## 2012-03-22 DIAGNOSIS — I498 Other specified cardiac arrhythmias: Secondary | ICD-10-CM | POA: Insufficient documentation

## 2012-03-22 NOTE — Progress Notes (Signed)
Echocardiogram performed.  

## 2012-03-31 NOTE — Addendum Note (Signed)
Addended by: Micki Riley C on: 03/31/2012 05:39 PM   Modules accepted: Orders

## 2012-05-02 ENCOUNTER — Other Ambulatory Visit: Payer: Self-pay | Admitting: Internal Medicine

## 2012-05-02 NOTE — Telephone Encounter (Signed)
Faxed hardcopy [facility]

## 2012-05-02 NOTE — Telephone Encounter (Signed)
Done hardcopy to robin  

## 2012-07-23 ENCOUNTER — Other Ambulatory Visit: Payer: Self-pay | Admitting: Internal Medicine

## 2012-07-24 ENCOUNTER — Other Ambulatory Visit: Payer: Self-pay | Admitting: Internal Medicine

## 2012-07-25 ENCOUNTER — Other Ambulatory Visit: Payer: Medicare Other

## 2012-07-25 ENCOUNTER — Ambulatory Visit: Payer: Medicare Other | Admitting: Neurosurgery

## 2012-07-26 ENCOUNTER — Other Ambulatory Visit (INDEPENDENT_AMBULATORY_CARE_PROVIDER_SITE_OTHER): Payer: Medicare Other

## 2012-07-26 ENCOUNTER — Other Ambulatory Visit: Payer: Medicare Other

## 2012-07-26 ENCOUNTER — Other Ambulatory Visit (INDEPENDENT_AMBULATORY_CARE_PROVIDER_SITE_OTHER): Payer: Medicare Other | Admitting: *Deleted

## 2012-07-26 ENCOUNTER — Encounter (INDEPENDENT_AMBULATORY_CARE_PROVIDER_SITE_OTHER): Payer: Medicare Other | Admitting: *Deleted

## 2012-07-26 DIAGNOSIS — I739 Peripheral vascular disease, unspecified: Secondary | ICD-10-CM

## 2012-07-26 DIAGNOSIS — Z48812 Encounter for surgical aftercare following surgery on the circulatory system: Secondary | ICD-10-CM

## 2012-07-26 DIAGNOSIS — I6529 Occlusion and stenosis of unspecified carotid artery: Secondary | ICD-10-CM

## 2012-08-02 ENCOUNTER — Other Ambulatory Visit: Payer: Self-pay | Admitting: *Deleted

## 2012-08-02 DIAGNOSIS — Z48812 Encounter for surgical aftercare following surgery on the circulatory system: Secondary | ICD-10-CM

## 2012-08-02 DIAGNOSIS — I739 Peripheral vascular disease, unspecified: Secondary | ICD-10-CM

## 2012-08-04 ENCOUNTER — Encounter: Payer: Self-pay | Admitting: Vascular Surgery

## 2012-08-04 ENCOUNTER — Other Ambulatory Visit: Payer: Self-pay | Admitting: *Deleted

## 2012-08-04 ENCOUNTER — Other Ambulatory Visit: Payer: Self-pay | Admitting: Internal Medicine

## 2012-08-04 DIAGNOSIS — I6529 Occlusion and stenosis of unspecified carotid artery: Secondary | ICD-10-CM

## 2012-08-04 DIAGNOSIS — I739 Peripheral vascular disease, unspecified: Secondary | ICD-10-CM

## 2012-08-04 DIAGNOSIS — Z48812 Encounter for surgical aftercare following surgery on the circulatory system: Secondary | ICD-10-CM

## 2012-08-29 ENCOUNTER — Other Ambulatory Visit: Payer: Self-pay | Admitting: Internal Medicine

## 2012-08-29 NOTE — Telephone Encounter (Signed)
Done hardcopy to robin  

## 2012-08-30 NOTE — Telephone Encounter (Signed)
Faxed hardcopy to [facility] [address] [place]

## 2012-10-17 ENCOUNTER — Other Ambulatory Visit: Payer: Self-pay

## 2012-10-17 MED ORDER — CLOPIDOGREL BISULFATE 75 MG PO TABS: 75.0000 mg | ORAL_TABLET | Freq: Every day | ORAL | Status: DC

## 2012-10-30 ENCOUNTER — Other Ambulatory Visit: Payer: Self-pay | Admitting: Internal Medicine

## 2012-12-25 ENCOUNTER — Other Ambulatory Visit: Payer: Self-pay | Admitting: Internal Medicine

## 2012-12-26 NOTE — Telephone Encounter (Signed)
Done hardcopy to robin  

## 2012-12-26 NOTE — Telephone Encounter (Signed)
Faxed hardcopy to [facility] [address] [place] 

## 2012-12-30 ENCOUNTER — Other Ambulatory Visit: Payer: Self-pay | Admitting: Internal Medicine

## 2013-01-18 ENCOUNTER — Other Ambulatory Visit (HOSPITAL_COMMUNITY): Payer: Self-pay | Admitting: Vascular Surgery

## 2013-01-18 ENCOUNTER — Other Ambulatory Visit: Payer: Self-pay | Admitting: Vascular Surgery

## 2013-01-18 ENCOUNTER — Other Ambulatory Visit: Payer: Self-pay | Admitting: Family

## 2013-01-18 DIAGNOSIS — I739 Peripheral vascular disease, unspecified: Secondary | ICD-10-CM

## 2013-01-18 DIAGNOSIS — Z48812 Encounter for surgical aftercare following surgery on the circulatory system: Secondary | ICD-10-CM

## 2013-01-19 ENCOUNTER — Other Ambulatory Visit: Payer: Self-pay | Admitting: Internal Medicine

## 2013-02-06 ENCOUNTER — Encounter (HOSPITAL_COMMUNITY): Payer: Medicare Other

## 2013-02-06 ENCOUNTER — Other Ambulatory Visit (HOSPITAL_COMMUNITY): Payer: Medicare Other

## 2013-02-06 ENCOUNTER — Ambulatory Visit: Payer: Medicare Other | Admitting: Family

## 2013-02-23 ENCOUNTER — Other Ambulatory Visit (HOSPITAL_COMMUNITY): Payer: Self-pay | Admitting: Ophthalmology

## 2013-02-23 DIAGNOSIS — I639 Cerebral infarction, unspecified: Secondary | ICD-10-CM

## 2013-03-07 ENCOUNTER — Encounter (HOSPITAL_COMMUNITY): Payer: Self-pay

## 2013-03-07 ENCOUNTER — Ambulatory Visit (HOSPITAL_COMMUNITY)
Admission: RE | Admit: 2013-03-07 | Discharge: 2013-03-07 | Disposition: A | Discharge status: Home / Self Care | Payer: Medicare Other | Source: Ambulatory Visit | Attending: Ophthalmology | Admitting: Ophthalmology

## 2013-03-07 DIAGNOSIS — I639 Cerebral infarction, unspecified: Secondary | ICD-10-CM

## 2013-03-07 DIAGNOSIS — E785 Hyperlipidemia, unspecified: Secondary | ICD-10-CM | POA: Insufficient documentation

## 2013-03-07 DIAGNOSIS — Z8673 Personal history of transient ischemic attack (TIA), and cerebral infarction without residual deficits: Secondary | ICD-10-CM | POA: Insufficient documentation

## 2013-03-07 DIAGNOSIS — G9389 Other specified disorders of brain: Secondary | ICD-10-CM | POA: Insufficient documentation

## 2013-03-07 DIAGNOSIS — E119 Type 2 diabetes mellitus without complications: Secondary | ICD-10-CM | POA: Insufficient documentation

## 2013-03-07 DIAGNOSIS — G319 Degenerative disease of nervous system, unspecified: Secondary | ICD-10-CM | POA: Insufficient documentation

## 2013-03-07 DIAGNOSIS — I672 Cerebral atherosclerosis: Secondary | ICD-10-CM | POA: Insufficient documentation

## 2013-03-07 DIAGNOSIS — I10 Essential (primary) hypertension: Secondary | ICD-10-CM | POA: Insufficient documentation

## 2013-03-07 LAB — CREATININE, SERUM: GFR calc Af Amer: 62 mL/min — ABNORMAL LOW (ref >90)

## 2013-03-07 LAB — BUN: BUN: 14 mg/dL (ref 6–23)

## 2013-03-07 MED ORDER — IOHEXOL 300 MG/ML  SOLN
100.0000 mL | Freq: Once | INTRAMUSCULAR | Status: AC | PRN
  Administered 2013-03-07: 100 mL via INTRAVENOUS

## 2013-03-13 ENCOUNTER — Encounter: Payer: Self-pay | Admitting: Family

## 2013-03-14 ENCOUNTER — Other Ambulatory Visit: Payer: Self-pay | Admitting: Vascular Surgery

## 2013-03-14 ENCOUNTER — Ambulatory Visit (HOSPITAL_COMMUNITY)
Admission: RE | Admit: 2013-03-14 | Discharge: 2013-03-14 | Disposition: A | Discharge status: Home / Self Care | Payer: Medicare Other | Source: Ambulatory Visit | Attending: Family | Admitting: Family

## 2013-03-14 ENCOUNTER — Ambulatory Visit (INDEPENDENT_AMBULATORY_CARE_PROVIDER_SITE_OTHER): Payer: Medicare Other | Admitting: Family

## 2013-03-14 ENCOUNTER — Ambulatory Visit (INDEPENDENT_AMBULATORY_CARE_PROVIDER_SITE_OTHER)
Admission: RE | Admit: 2013-03-14 | Discharge: 2013-03-14 | Disposition: A | Discharge status: Home / Self Care | Payer: Medicare Other | Source: Ambulatory Visit | Attending: Vascular Surgery | Admitting: Vascular Surgery

## 2013-03-14 ENCOUNTER — Encounter: Payer: Self-pay | Admitting: Family

## 2013-03-14 DIAGNOSIS — Z48812 Encounter for surgical aftercare following surgery on the circulatory system: Secondary | ICD-10-CM

## 2013-03-14 DIAGNOSIS — I739 Peripheral vascular disease, unspecified: Secondary | ICD-10-CM

## 2013-03-14 DIAGNOSIS — I658 Occlusion and stenosis of other precerebral arteries: Secondary | ICD-10-CM | POA: Insufficient documentation

## 2013-03-14 DIAGNOSIS — I6529 Occlusion and stenosis of unspecified carotid artery: Secondary | ICD-10-CM | POA: Insufficient documentation

## 2013-03-14 NOTE — Progress Notes (Signed)
[facility] [place] HISTORY AND PHYSICAL   MRN : [id number]  History of Present Illness:   Tracy [name] is a 79 y.o. female patient of Dr. Hart [place] status post bilateral common iliac and left external iliac stents in 2011. She returns today for arotid Duplex, ABI's, and aorto-iliac Duplex evaluation.     Pt. denies claudication in legs.  Pt. denies rest pain; denies night pain denies non healing ulcers on lower extremity. Patient has Positive history of TIA or stroke symptom 10-12 years ago as manifested by repeating the same several phrases which she did realize, lasted about 3-4 days, has had no further stroke symptoms.  The patient denies amaurosis fugax or monocular blindness.  The patient  denies facial drooping.  Pt. denies hemiplegia.  The patient denies receptive or expressive aphasia.   The patient has not had back or abdominal pain.  Her main complaint is generalized weakness after walking about 200 feet, denies specific weakness in legs or buttocks with walking. Has atrial fib.  Pt Diabetic: No, patient denies having even though it is listed in her PMHX. Pt smoker: former smoker, quit 25-30 years ago  Current Outpatient Prescriptions  Medication Sig Dispense Refill  . ACTONEL 35 MG tablet TAKE 1 TABLET (35 MG TOTAL) BY MOUTH EVERY 7 (SEVEN) DAYS.  12 tablet  0  . amLODipine-benazepril (LOTREL) 10-40 MG per capsule TAKE 1 CAPSULE BY MOUTH DAILY.  90 capsule  7  . aspirin 81 MG tablet Take 81 mg by mouth daily.        . Calcium Carbonate (CALTRATE 600) 1500 MG TABS Take by mouth 2 (two) times daily.        . clopidogrel (PLAVIX) 75 MG tablet Take 1 tablet (75 mg total) by mouth daily.  90 tablet  1  . fenofibrate micronized (LOFIBRA) 134 MG capsule TAKE 1 CAPSULE BY MOUTH EVERY DAY  90 capsule  1  . HYDROcodone-acetaminophen (NORCO/VICODIN) 5-325 MG per tablet TAKE 1 TABLET EVERY 6 HOURS AS NEEDED FOR PAIN  60 tablet  1  . Multiple Vitamin  (MULTIVITAMIN) capsule Take 1 capsule by mouth daily.        . pentoxifylline (TRENTAL) 400 MG CR tablet TAKE 1 TABLET BY MOUTH EVERY DAY  90 tablet  0  . Probiotic Product (PHILLIPS COLON HEALTH) CAPS Take by mouth 2 (two) times daily.        . Wheat Dextrin (BENEFIBER) POWD Take by mouth 1 dose over 46 hours.         No current facility-administered medications for this visit.    Pt meds include: Statin :No ASA: Yes Other anticoagulants/antiplatelets: Plavix  Past Medical History  Diagnosis Date  . Hyperlipidemia   . HTN (hypertension)   . GERD (gastroesophageal reflux disease)   . CVA (cerebrovascular accident) 2002  . Osteopenia   . Hx of adenomatous colonic polyps 2007  . Diverticulosis of colon   . DM (diabetes mellitus), type 2   . PVD (peripheral vascular disease)     recently dx w/ carotid -left  int carotid >90% 50-60% rt int carotid 02/2010  . PVD (peripheral vascular disease)     lf  ABI 0.11 and rt 0.43  . CAROTID STENOSIS   . DIABETES MELLITUS, TYPE II   . DIVERTICULOSIS, COLON   . GERD   . HYPERLIPIDEMIA   . OSTEOPENIA   . Atrial fibrillation   . DVT (deep venous thrombosis)   . Sinus node dysfunction 2011  Past Surgical History  Procedure Laterality Date  . Cns vascular stent per neurosurgeon    . Tubal ligation    . Right shoulder surgery  2010  . Cataract eye surgery    . Lazer eye surgery    . Iliac artery stent  2011    Bilateral CIA stent    Social History History  Substance Use Topics  . Smoking status: Former Smoker    Quit date: 04/06/1991  . Smokeless tobacco: Not on file  . Alcohol Use: No    Family History Family History  Problem Relation Age of Onset  . Cancer Mother     colon  . Diabetes Sister     Amputation    No Known Allergies  Physical Examination Filed Vitals:   03/14/13 1202  BP: 176/74  Pulse: 54  Temp: 97.2 F (36.2 C)  Resp: 14   Filed Weights   03/14/13 1202  Weight: 113 lb (51.256 kg)   Body  mass index is 22.81 kg/(m^2).  General:  WDWN in NAD Gait: Normal HENT: WNL Eyes: Pupils equal Pulmonary: normal non-labored breathing , without Rales, rhonchi,  wheezing Cardiac: Irregular rhythm, without  Murmurs. No carotid bruits Abdomen: soft, NT, no masses Skin: no rashes, ulcers noted;  no Gangrene , no cellulitis; no open wounds.                     VASCULAR EXAM: Extremities without ischemic changes  without Gangrene; without open wounds.                                                                                                          LE Pulses Left RIGHT       FEMORAL  palpable  faintly palpable        POPLITEAL Not palpable  not palpable       POSTERIOR TIBIAL Not palpable  not palpable        DORSALIS PEDIS      ANTERIOR TIBIAL Not palpable not palpable      Musculoskeletal: no muscle wasting or atrophy; no edema  Neurologic: A&O X 3; Appropriate Affect ;  SENSATION: normal; MOTOR FUNCTION: 5/5 Symmetric, CN 2-12 intact Speech is fluent/normal   Significant Diagnostic Studies: Head CT of 02/23/13:  Remote moderate to large size occipital lobe infarcts with  encephalomalacia more notable on the left. Remote appearing moderate  size left cerebellar infarct.  Small infarcts of the thalamus bilaterally, caudate head bilaterally  and right paracentral pontine region of questionable age.  Prior MR made reference to bilateral occipital lobe, bilateral  caudate head and thalamic infarcts.  Small vessel disease type changes.  No intracranial enhancing lesion.  No intracranial hemorrhage.  Global atrophy without hydrocephalus.  Prominent vascular calcifications with narrowing of the vertebral  arteries and carotid arteries.    Non-Invasive Vascular Imaging(03/14/2013):  Carotid Duplex: Right ICA: 40-59% stenosis, improved from 6 months prior at 60-79% Left ICA (CEA site): <40% stenosis  ABI's: Right: 0.61, Left: 0.48, both with monophasic  waveforms Previous (07/26/12):  Right: 0.53, Left: 0.54  Iliac artery stent Duplex: Patent bilateral common iliac/external iliac stenting present. Elevated velocity of 323 cm/sec involving the right internal iliac artery suggestive of significant stenosis. Elevated velocities involving the right mid/distal iliac stent, however not of hemodynamic significance.  ASSESSMENT:  Right ICA stenosis improved from 60-79% to 40-59%, left ICA stenosis is stable and minimal, no stroke symptoms since 10-12 years ago. Generalized weakness after walking about 200 feet which is relieved by rest. Stable ABI's at moderate arterial occlusive disease in the right LE and severe arterial occlusive disease in the left LE. Stable iliac artery stenosis.  PLAN:   Based on today's exam and Duplex results, and after discussing with Dr. Edilia Bo, patient advised to return in 1 year for Carotid Duplex, ABI's, and iliac artery stent evaluation. Patient advised to start graduated walking program; she used to walk 5 miles daily and stopped since the property on which she used to walk was made off limits to her. I discussed in depth with the patient the nature of atherosclerosis, and emphasized the importance of maximal medical management including strict control of blood pressure, blood glucose, and lipid levels, obtaining regular exercise, and continued cessation of smoking.  The patient is aware that without maximal medical management the underlying atherosclerotic disease process will progress, limiting the benefit of any interventions. The patient was given information about stroke prevention and what symptoms should prompt the patient to seek immediate medical care.  The patient was given information about PAD and stroke prevention including signs, symptoms, treatment, what symptoms should prompt the patient to seek immediate medical care, and risk reduction measures to take.  Charisse March, RN, MSN, FNP-C [facility] Office: [phone]  Clinic MD: Edilia Bo 03/14/2013 9:38 AM

## 2013-03-14 NOTE — Patient Instructions (Addendum)
Peripheral Vascular Disease Peripheral Vascular Disease (PVD), also called Peripheral Arterial Disease (PAD), is a circulation problem caused by cholesterol (atherosclerotic plaque) deposits in the arteries. PVD commonly occurs in the lower extremities (legs) but it can occur in other areas of the body, such as your arms. The cholesterol buildup in the arteries reduces blood flow which can cause pain and other serious problems. The presence of PVD can place a person at risk for Coronary Artery Disease (CAD).  CAUSES  Causes of PVD can be many. It is usually associated with more than one risk factor such as:   High Cholesterol.  Smoking.  Diabetes.  Lack of exercise or inactivity.  High blood pressure (hypertension).  Obesity.  Family history. SYMPTOMS   When the lower extremities are affected, patients with PVD may experience:  Leg pain with exertion or physical activity. This is called INTERMITTENT CLAUDICATION. This may present as cramping or numbness with physical activity. The location of the pain is associated with the level of blockage. For example, blockage at the abdominal level (distal abdominal aorta) may result in buttock or hip pain. Lower leg arterial blockage may result in calf pain.  As PVD becomes more severe, pain can develop with less physical activity.  In people with severe PVD, leg pain may occur at rest.  Other PVD signs and symptoms:  Leg numbness or weakness.  Coldness in the affected leg or foot, especially when compared to the other leg.  A change in leg color.  Patients with significant PVD are more prone to ulcers or sores on toes, feet or legs. These may take longer to heal or may reoccur. The ulcers or sores can become infected.  If signs and symptoms of PVD are ignored, gangrene may occur. This can result in the loss of toes or loss of an entire limb.  Not all leg pain is related to PVD. Other medical conditions can cause leg pain such  as:  Blood clots (embolism) or Deep Vein Thrombosis.  Inflammation of the blood vessels (vasculitis).  Spinal stenosis. DIAGNOSIS  Diagnosis of PVD can involve several different types of tests. These can include:  Pulse Volume Recording Method (PVR). This test is simple, painless and does not involve the use of X-rays. PVR involves measuring and comparing the blood pressure in the arms and legs. An ABI (Ankle-Brachial Index) is calculated. The normal ratio of blood pressures is 1. As this number becomes smaller, it indicates more severe disease.  < 0.95  indicates significant narrowing in one or more leg vessels.  <0.8 there will usually be pain in the foot, leg or buttock with exercise.  <0.4 will usually have pain in the legs at rest.  <0.25  usually indicates limb threatening PVD.  Doppler detection of pulses in the legs. This test is painless and checks to see if you have a pulses in your legs/feet.  A dye or contrast material (a substance that highlights the blood vessels so they show up on x-ray) may be given to help your caregiver better see the arteries for the following tests. The dye is eliminated from your body by the kidney's. Your caregiver may order blood work to check your kidney function and other laboratory values before the following tests are performed:  Magnetic Resonance Angiography (MRA). An MRA is a picture study of the blood vessels and arteries. The MRA machine uses a large magnet to produce images of the blood vessels.  Computed Tomography Angiography (CTA). A CTA is a   specialized x-ray that looks at how the blood flows in your blood vessels. An IV may be inserted into your arm so contrast dye can be injected.  Angiogram. Is a procedure that uses x-rays to look at your blood vessels. This procedure is minimally invasive, meaning a small incision (cut) is made in your groin. A small tube (catheter) is then inserted into the artery of your groin. The catheter is  guided to the blood vessel or artery your caregiver wants to examine. Contrast dye is injected into the catheter. X-rays are then taken of the blood vessel or artery. After the images are obtained, the catheter is taken out. TREATMENT  Treatment of PVD involves many interventions which may include:  Lifestyle changes:  Quitting smoking.  Exercise.  Following a low fat, low cholesterol diet.  Control of diabetes.  Foot care is very important to the PVD patient. Good foot care can help prevent infection.  Medication:  Cholesterol-lowering medicine.  Blood pressure medicine.  Anti-platelet drugs.  Certain medicines may reduce symptoms of Intermittent Claudication.  Interventional/Surgical options:  Angioplasty. An Angioplasty is a procedure that inflates a balloon in the blocked artery. This opens the blocked artery to improve blood flow.  Stent Implant. A wire mesh tube (stent) is placed in the artery. The stent expands and stays in place, allowing the artery to remain open.  Peripheral Bypass Surgery. This is a surgical procedure that reroutes the blood around a blocked artery to help improve blood flow. This type of procedure may be performed if Angioplasty or stent implants are not an option. SEEK IMMEDIATE MEDICAL CARE IF:   You develop pain or numbness in your arms or legs.  Your arm or leg turns cold, becomes blue in color.  You develop redness, warmth, swelling and pain in your arms or legs. MAKE SURE YOU:   Understand these instructions.  Will watch your condition.  Will get help right away if you are not doing well or get worse. Document Released: 04/29/2004 Document Revised: 06/14/2011 Document Reviewed: 03/26/2008 ExitCare Patient Information 2014 [employer].   Stroke Prevention Some medical conditions and behaviors are associated with an increased chance of having a stroke. You may prevent a stroke by making healthy choices and managing medical  conditions. Reduce your risk of having a stroke by:  Staying physically active. Get at least 30 minutes of activity on most or all days.  Not smoking. It may also be helpful to avoid exposure to secondhand smoke.  Limiting alcohol use. Moderate alcohol use is considered to be:  No more than 2 drinks per day for men.  No more than 1 drink per day for nonpregnant women.  Eating healthy foods.  Include 5 or more servings of fruits and vegetables a day.  Certain diets may be prescribed to address high blood pressure, high cholesterol, diabetes, or obesity.  Managing your cholesterol levels.  A low-saturated fat, low-trans fat, low-cholesterol, and high-fiber diet may control cholesterol levels.  Take any prescribed medicines to control cholesterol as directed by your caregiver.  Managing your diabetes.  A controlled-carbohydrate, controlled-sugar diet is recommended to manage diabetes.  Take any prescribed medicines to control diabetes as directed by your caregiver.  Controlling your high blood pressure (hypertension).  A low-salt (sodium), low-saturated fat, low-trans fat, and low-cholesterol diet is recommended to manage high blood pressure.  Take any prescribed medicines to control hypertension as directed by your caregiver.  Maintaining a healthy weight.  A reduced-calorie, low-sodium, low-saturated fat, low-trans   fat, low-cholesterol diet is recommended to manage weight.  Stopping drug abuse.  Avoiding birth control pills.  Talk to your caregiver about the risks of taking birth control pills if you are over 35 years old, smoke, get migraines, or have ever had a blood clot.  Getting evaluated for sleep disorders (sleep apnea).  Talk to your caregiver about getting a sleep evaluation if you snore a lot or have excessive sleepiness.  Taking medicines as directed by your caregiver.  For some people, aspirin or blood thinners (anticoagulants) are helpful in reducing  the risk of forming abnormal blood clots that can lead to stroke. If you have the irregular heart rhythm of atrial fibrillation, you should be on a blood thinner unless there is a good reason you cannot take them.  Understand all your medicine instructions. SEEK IMMEDIATE MEDICAL CARE IF:   You have sudden weakness or numbness of the face, arm, or leg, especially on one side of the body.  You have sudden confusion.  You have trouble speaking (aphasia) or understanding.  You have sudden trouble seeing in one or both eyes.  You have sudden trouble walking.  You have dizziness.  You have a loss of balance or coordination.  You have a sudden, severe headache with no known cause.  You have new chest pain or an irregular heartbeat. Any of these symptoms may represent a serious problem that is an emergency. Do not wait to see if the symptoms will go away. Get medical help right away. Call your local emergency services (911 in [place] Do not drive yourself to the hospital. Document Released: 04/29/2004 Document Revised: 06/14/2011 Document Reviewed: 09/22/2012 [employer] Patient Information 2014 [employer].  

## 2013-04-02 ENCOUNTER — Other Ambulatory Visit: Payer: Self-pay | Admitting: Internal Medicine

## 2013-04-10 ENCOUNTER — Other Ambulatory Visit: Payer: Self-pay | Admitting: Internal Medicine

## 2013-04-18 ENCOUNTER — Other Ambulatory Visit: Payer: Self-pay | Admitting: Internal Medicine

## 2013-04-24 ENCOUNTER — Telehealth: Payer: Self-pay

## 2013-04-24 ENCOUNTER — Other Ambulatory Visit: Payer: Self-pay | Admitting: Internal Medicine

## 2013-04-24 NOTE — Telephone Encounter (Signed)
Actonel is on back order, please change

## 2013-04-24 NOTE — Telephone Encounter (Signed)
Ok to hold on taking medication for now if has taken 5 yrs or more, and this o/w can be addressed at next visit

## 2013-04-25 NOTE — Telephone Encounter (Signed)
Patient informed of MD instructions. 

## 2013-04-25 NOTE — Telephone Encounter (Signed)
Pharmacy informed as well.

## 2013-05-10 ENCOUNTER — Other Ambulatory Visit: Payer: Self-pay | Admitting: Internal Medicine

## 2013-05-12 ENCOUNTER — Other Ambulatory Visit: Payer: Self-pay | Admitting: Internal Medicine

## 2013-05-16 ENCOUNTER — Other Ambulatory Visit: Payer: Self-pay | Admitting: Internal Medicine

## 2013-05-16 ENCOUNTER — Encounter: Payer: Self-pay | Admitting: Internal Medicine

## 2013-05-16 ENCOUNTER — Ambulatory Visit (INDEPENDENT_AMBULATORY_CARE_PROVIDER_SITE_OTHER): Payer: Medicare Other | Admitting: Internal Medicine

## 2013-05-16 VITALS — BP 152/72 | HR 52 | Temp 98.1°F | Wt 113.0 lb

## 2013-05-16 DIAGNOSIS — M25512 Pain in left shoulder: Secondary | ICD-10-CM

## 2013-05-16 DIAGNOSIS — M25519 Pain in unspecified shoulder: Secondary | ICD-10-CM

## 2013-05-16 DIAGNOSIS — Z23 Encounter for immunization: Secondary | ICD-10-CM

## 2013-05-16 DIAGNOSIS — E119 Type 2 diabetes mellitus without complications: Secondary | ICD-10-CM

## 2013-05-16 DIAGNOSIS — Z0000 Encounter for general adult medical examination without abnormal findings: Secondary | ICD-10-CM

## 2013-05-16 DIAGNOSIS — I495 Sick sinus syndrome: Secondary | ICD-10-CM

## 2013-05-16 MED ORDER — HYDROCODONE-ACETAMINOPHEN 5-325 MG PO TABS: ORAL_TABLET | ORAL | Status: DC

## 2013-05-16 MED ORDER — AMLODIPINE BESY-BENAZEPRIL HCL 10-40 MG PO CAPS: 1.0000 | ORAL_CAPSULE | Freq: Every day | ORAL | Status: DC

## 2013-05-16 MED ORDER — PENTOXIFYLLINE ER 400 MG PO TBCR: 400.0000 mg | EXTENDED_RELEASE_TABLET | Freq: Every day | ORAL | Status: DC

## 2013-05-16 MED ORDER — FENOFIBRATE MICRONIZED 134 MG PO CAPS: 134.0000 mg | ORAL_CAPSULE | Freq: Every day | ORAL | Status: DC

## 2013-05-16 NOTE — Progress Notes (Signed)
Pre-visit discussion using our clinic review tool. No additional management support is needed unless otherwise documented below in the visit note.  

## 2013-05-16 NOTE — Progress Notes (Signed)
Subjective:    Patient ID: Tracy [name], female    DOB: [birth year], 80 y.o.   MRN: [id number]  HPI  Here for wellness and f/u;  Overall doing ok;  Pt denies CP, worsening SOB, DOE, wheezing, orthopnea, PND, worsening LE edema, palpitations, dizziness or syncope.  Pt denies neurological change such as new headache, facial or extremity weakness.  Pt denies polydipsia, polyuria, or low sugar symptoms. Pt states overall good compliance with treatment and medications, good tolerability, and has been trying to follow lower cholesterol diet.  Pt denies worsening depressive symptoms, suicidal ideation or panic. No fever, night sweats, wt loss, loss of appetite, or other constitutional symptoms.  Pt states good ability with ADL's, has low fall risk, home safety reviewed and adequate, no other significant changes in hearing or vision, and only occasionally active with exercise. Also Here to f/u with husband, has hx of right shoulder pain chronic, but now left worse , both severe and miserable as she is out of hydrocodone as well.  No recent exam or tx.   Past Medical History  Diagnosis Date  . Hyperlipidemia   . HTN (hypertension)   . GERD (gastroesophageal reflux disease)   . CVA (cerebrovascular accident) 2002  . Osteopenia   . Hx of adenomatous colonic polyps 2007  . Diverticulosis of colon   . DM (diabetes mellitus), type 2   . PVD (peripheral vascular disease)     recently dx w/ carotid -left  int carotid >90% 50-60% rt int carotid 02/2010  . PVD (peripheral vascular disease)     lf  ABI 0.11 and rt 0.43  . CAROTID STENOSIS   . DIABETES MELLITUS, TYPE II   . DIVERTICULOSIS, COLON   . GERD   . HYPERLIPIDEMIA   . OSTEOPENIA   . Atrial fibrillation   . DVT (deep venous thrombosis)   . Sinus node dysfunction 2011   Past Surgical History  Procedure Laterality Date  . Cns vascular stent per neurosurgeon    . Tubal ligation    . Right shoulder surgery  2010  . Cataract eye surgery    .  Lazer eye surgery    . Iliac artery stent  2011    Bilateral CIA stent    reports that she quit smoking about 22 years ago. She does not have any smokeless tobacco history on file. She reports that she does not drink alcohol or use illicit drugs. family history includes Cancer in her mother; Diabetes in her sister. No Known Allergies Current Outpatient Prescriptions on File Prior to Visit  Medication Sig Dispense Refill  . aspirin 81 MG tablet Take 81 mg by mouth daily.        . Calcium Carbonate (CALTRATE 600) 1500 MG TABS Take by mouth 2 (two) times daily.        . clopidogrel (PLAVIX) 75 MG tablet TAKE 1 TABLET (75 MG TOTAL) BY MOUTH DAILY.  90 tablet  0  . latanoprost (XALATAN) 0.005 % ophthalmic solution Place into both eyes daily.      . Multiple Vitamin (MULTIVITAMIN) capsule Take 1 capsule by mouth daily.         No current facility-administered medications on file prior to visit.   Review of Systems Constitutional: Negative for diaphoresis, activity change, appetite change or unexpected weight change.  HENT: Negative for hearing loss, ear pain, facial swelling, mouth sores and neck stiffness.   Eyes: Negative for pain, redness and visual disturbance.  Respiratory: Negative for shortness  of breath and wheezing.   Cardiovascular: Negative for chest pain and palpitations.  Gastrointestinal: Negative for diarrhea, blood in stool, abdominal distention or other pain Genitourinary: Negative for hematuria, flank pain or change in urine volume.  Musculoskeletal: Negative for myalgias and joint swelling.  Skin: Negative for color change and wound.  Neurological: Negative for syncope and numbness. other than noted Hematological: Negative for adenopathy.  Psychiatric/Behavioral: Negative for hallucinations, self-injury, decreased concentration and agitation.      Objective:   Physical Exam BP 152/72  Pulse 52  Temp(Src) 98.1 F (36.7 C) (Oral)  Wt 113 lb (51.256 kg)  SpO2 98% VS  noted,  Constitutional: Pt is oriented to person, place, and time. Appears well-developed and well-nourished.  Head: Normocephalic and atraumatic.  Right Ear: External ear normal.  Left Ear: External ear normal.  Nose: Nose normal.  Mouth/Throat: Oropharynx is clear and moist.  Eyes: Conjunctivae and EOM are normal. Pupils are equal, round, and reactive to light.  Neck: Normal range of motion. Neck supple. No JVD present. No tracheal deviation present.  Cardiovascular: Normal rate, regular rhythm, normal heart sounds and intact distal pulses.   Pulmonary/Chest: Effort normal and breath sounds normal.  Abdominal: Soft. Bowel sounds are normal. There is no tenderness. No HSM  Musculoskeletal: Normal range of motion. Exhibits no edema.  Lymphadenopathy:  Has no cervical adenopathy.  Neurological: Pt is alert and oriented to person, place, and time. Pt has normal reflexes. No cranial nerve deficit.  Skin: Skin is warm and dry. No rash noted.  Psychiatric:  Has  normal mood and affect. Behavior is normal.  Bilat shoulders with marked pain on abduction to 90 degrees    Assessment & Plan:

## 2013-05-16 NOTE — Patient Instructions (Addendum)
You had the new Prevnar pneumonia shot  Please take all new medication as prescribed - the hydrocodone  Please continue all other medications as before  Please have the pharmacy call with any other refills you may need.  You have an Appointment at [facility] tomorrow with Dr Katrinka BlazingSmith to check your shoulders in this office.  Please see the scheduling desk if you need to change this as you leave today  Remember, I cannot provide monthly or regular hydrocodone after this as I do not normally do chronic pain management.  I can refer you to a pain clinic if Dr Katrinka BlazingSmith is unable to help the pain enough  You will be contacted regarding the referral for: cardiology as you are due, with Dr Johney FrameAllred  Please go to the LAB in the Basement (turn left off the elevator) for the tests to be done tomorrow morning  Please return in 6 months, or sooner if needed

## 2013-05-17 ENCOUNTER — Encounter: Payer: Self-pay | Admitting: Family Medicine

## 2013-05-17 ENCOUNTER — Other Ambulatory Visit (INDEPENDENT_AMBULATORY_CARE_PROVIDER_SITE_OTHER): Payer: Medicare Other

## 2013-05-17 ENCOUNTER — Ambulatory Visit (INDEPENDENT_AMBULATORY_CARE_PROVIDER_SITE_OTHER): Payer: Medicare Other | Admitting: Family Medicine

## 2013-05-17 ENCOUNTER — Encounter: Payer: Self-pay | Admitting: Internal Medicine

## 2013-05-17 VITALS — BP 106/60 | HR 51 | Temp 97.3°F | Resp 16 | Wt 113.1 lb

## 2013-05-17 DIAGNOSIS — Z0000 Encounter for general adult medical examination without abnormal findings: Secondary | ICD-10-CM

## 2013-05-17 DIAGNOSIS — M25519 Pain in unspecified shoulder: Secondary | ICD-10-CM

## 2013-05-17 DIAGNOSIS — M19019 Primary osteoarthritis, unspecified shoulder: Secondary | ICD-10-CM

## 2013-05-17 DIAGNOSIS — M25512 Pain in left shoulder: Secondary | ICD-10-CM

## 2013-05-17 DIAGNOSIS — E119 Type 2 diabetes mellitus without complications: Secondary | ICD-10-CM

## 2013-05-17 DIAGNOSIS — M75102 Unspecified rotator cuff tear or rupture of left shoulder, not specified as traumatic: Secondary | ICD-10-CM

## 2013-05-17 DIAGNOSIS — M12812 Other specific arthropathies, not elsewhere classified, left shoulder: Secondary | ICD-10-CM

## 2013-05-17 LAB — URINALYSIS, ROUTINE W REFLEX MICROSCOPIC
Bilirubin Urine: NEGATIVE
HGB URINE DIPSTICK: NEGATIVE
Leukocytes, UA: NEGATIVE
Nitrite: NEGATIVE
RBC / HPF: NONE SEEN (ref 0–?)
Specific Gravity, Urine: >=1.03 — AB (ref 1.000–1.030)
Total Protein, Urine: 30 — AB
URINE GLUCOSE: NEGATIVE
UROBILINOGEN UA: 0.2 (ref 0.0–1.0)
WBC, UA: NONE SEEN (ref 0–?)
pH: 5 (ref 5.0–8.0)

## 2013-05-17 LAB — BASIC METABOLIC PANEL
BUN: 28 mg/dL — ABNORMAL HIGH (ref 6–23)
CALCIUM: 10.2 mg/dL (ref 8.4–10.5)
CO2: 30 meq/L (ref 19–32)
Chloride: 104 mEq/L (ref 96–112)
Creatinine, Ser: 1.3 mg/dL — ABNORMAL HIGH (ref 0.4–1.2)
GFR: 51.08 mL/min — AB (ref >60.00)
Glucose, Bld: 112 mg/dL — ABNORMAL HIGH (ref 70–99)
Potassium: 3.3 mEq/L — ABNORMAL LOW (ref 3.5–5.1)
SODIUM: 142 meq/L (ref 135–145)

## 2013-05-17 LAB — CBC WITH DIFFERENTIAL/PLATELET
BASOS ABS: 0 10*3/uL (ref 0.0–0.1)
Basophils Relative: 0.1 % (ref 0.0–3.0)
EOS ABS: 0.3 10*3/uL (ref 0.0–0.7)
Eosinophils Relative: 3.3 % (ref 0.0–5.0)
HEMATOCRIT: 38.7 % (ref 36.0–46.0)
Hemoglobin: 12.4 g/dL (ref 12.0–15.0)
LYMPHS ABS: 0.7 10*3/uL (ref 0.7–4.0)
Lymphocytes Relative: 7.2 % — ABNORMAL LOW (ref 12.0–46.0)
MCHC: 32 g/dL (ref 30.0–36.0)
MCV: 91.9 fl (ref 78.0–100.0)
Monocytes Absolute: 0.6 10*3/uL (ref 0.1–1.0)
Monocytes Relative: 6.7 % (ref 3.0–12.0)
Neutro Abs: 7.9 10*3/uL — ABNORMAL HIGH (ref 1.4–7.7)
Neutrophils Relative %: 82.7 % — ABNORMAL HIGH (ref 43.0–77.0)
Platelets: 320 10*3/uL (ref 150.0–400.0)
RBC: 4.21 Mil/uL (ref 3.87–5.11)
RDW: 14.8 % — AB (ref 11.5–14.6)
WBC: 9.6 10*3/uL (ref 4.5–10.5)

## 2013-05-17 LAB — LIPID PANEL
CHOL/HDL RATIO: 3
Cholesterol: 132 mg/dL (ref 0–200)
HDL: 41.1 mg/dL (ref >39.00)
LDL CALC: 69 mg/dL (ref 0–99)
Triglycerides: 111 mg/dL (ref 0.0–149.0)
VLDL: 22.2 mg/dL (ref 0.0–40.0)

## 2013-05-17 LAB — HEPATIC FUNCTION PANEL
ALK PHOS: 27 U/L — AB (ref 39–117)
ALT: 13 U/L (ref 0–35)
AST: 24 U/L (ref 0–37)
Albumin: 3.8 g/dL (ref 3.5–5.2)
BILIRUBIN DIRECT: 0.1 mg/dL (ref 0.0–0.3)
BILIRUBIN TOTAL: 1.1 mg/dL (ref 0.3–1.2)
Total Protein: 7 g/dL (ref 6.0–8.3)

## 2013-05-17 LAB — TSH: TSH: 2.51 u[IU]/mL (ref 0.35–5.50)

## 2013-05-17 LAB — HEMOGLOBIN A1C: Hgb A1c MFr Bld: 6 % (ref 4.6–6.5)

## 2013-05-17 LAB — MICROALBUMIN / CREATININE URINE RATIO
CREATININE, U: 301.8 mg/dL
MICROALB UR: 17.9 mg/dL — AB (ref 0.0–1.9)
Microalb Creat Ratio: 5.9 mg/g (ref 0.0–30.0)

## 2013-05-17 NOTE — Patient Instructions (Signed)
Very nice to meet you Try vitamin D 2000 Iu daily.  Ice 20 minutes 2 times a day Exercises most days of the week. Come back again in 4 weeks.

## 2013-05-17 NOTE — Progress Notes (Signed)
Pre-visit discussion using our clinic review tool. No additional management support is needed unless otherwise documented below in the visit note.  

## 2013-05-17 NOTE — Assessment & Plan Note (Signed)
Patient does have a degenerative tear of the rotator cuff and does have weakness. Patient's other past medical history I do not think she is a surgical candidate and would need a shoulder replacement surgery. We'll try conservative approach and was given a steroid injection today. Discuss potential side effects. Patient tolerated the procedure well with decreasing pain. Patient was given home exercise program to try to do on a fairly regular basis and we discussed icing protocol. Patient will followup again in 4 weeks. I do feel safe do injections every 8 weeks in this individual if necessary.  Discussed potentially getting x-rays but patient's husband was adamant against this. Patient's husband was nervous money.

## 2013-05-17 NOTE — Progress Notes (Signed)
Tracy [name] D.O. [facility] [address] [place], [place] [address] Phone: [phone] Subjective:    I'm seeing this patient by the request  of:  Tracy BarreJames John, MD   CC: Left shoulder pain  AOZ:[id number]:Subjective Tracy [name] W Tracy [name] is a 80 y.o. female coming in with complaint of left shoulder pain. Patient does have a past medical history significant for right rotator cuff surgery in 2010. Patient states this feels somewhat similar. Patient has some weakness of this arm and states that it hurts when she tries to pick it up in the breakdown slowly. Patient states that it hurts when she lays on it does make sounds when she moves it. Patient rates the pain a 9/10. Patient is awake tried over-the-counter medicines at recently patient primary care provider did give her some narcotics which did help her sleep at night. Patient states that the pain comes back right away in the morning. Unable to do other medications secondary to her heart issues.     Past medical history, social, surgical and family history all reviewed in electronic medical record.   Review of Systems: No headache, visual changes, nausea, vomiting, diarrhea, constipation, dizziness, abdominal pain, skin rash, fevers, chills, night sweats, weight loss, swollen lymph nodes, body aches, joint swelling, muscle aches, chest pain, shortness of breath, mood changes.   Objective Blood pressure 106/60, pulse 51, temperature 97.3 F (36.3 C), temperature source Oral, resp. rate 16, weight 113 lb 1.3 oz (51.293 kg), SpO2 97.00%.  General: No apparent distress alert and oriented x3 mood and affect normal, dressed appropriately. Frail HEENT: Pupils equal, extraocular movements intact  Respiratory: Patient's speak in full sentences and does not appear short of breath  Cardiovascular: No lower extremity edema, non tender, no erythema  Skin: Warm dry intact with no signs of infection or rash on extremities or on axial skeleton.  Abdomen:  Soft nontender  Neuro: Cranial nerves II through XII are intact, neurovascularly intact in all extremities with 2+ DTRs and 2+ pulses.  Lymph: No lymphadenopathy of posterior or anterior cervical chain or axillae bilaterally.  Gait slow gait with some shoveling.  MSK:  Non tender with full range of motion and good stability and symmetric strength and tone of shoulders, elbows, wrist, hip, knee and ankles bilaterally. Patient though is very frail and does have osteoarthritic changes of multiple joints. Shoulder: Left Inspection reveals significant atrophy of the musculature bilaterally Palpation show some generalized tenderness ROM is full in all planes passively but does have severe crepitus Rotator cuff strength 3/5. Positive signs of impingement Speeds and Yergason's tests normal. No labral pathology noted with negative Obrien's, negative clunk and good stability. Normal scapular function observed. painful arc and no drop arm sign.  MSK US performed of:  This study was ordered, performed, and interpreted by Tracy [name] D.O.  Shoulder:   Supraspinatus:  Patient does have a near full-thickness tear of the supraspinatus but no significant retraction but lots of hypoechoic  Infraspinatus: Large full thickness tear with 1.1 cm it for traction.. Subscapularis: Degenerative intersubstance tearing but no retraction. Teres Minor:  Appears normal on long and transverse views. AC joint:  Capsule undistended, no geyser sign. Glenohumeral Joint: Severe arthritis. Glenoid Labrum:  Intact without visualized tears. Biceps Tendon:  Appears normal on long and transverse views, no fraying of tendon, tendon located in intertubercular groove, no subluxation with shoulder internal or external rotation. No increased power doppler signal.  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  Verbal informed consent obtained.  Time-out conducted.  Noted no overlying erythema, induration, or other signs of local infection.  Skin prepped in a sterile fashion.  Local anesthesia: Topical Ethyl chloride.  With sterile technique and under real time ultrasound guidance:  Joint visualized.  23g 1  inch needle inserted posterior approach. Pictures taken for needle placement. Patient did have injection of 2 cc of 1% lidocaine, 2 cc of 0.5% Marcaine, and 1cc of Kenalog 40 mg/dL. Completed without difficulty  Pain immediately resolved suggesting accurate placement of the medication.  Advised to call if fevers/chills, erythema, induration, drainage, or persistent bleeding.  Images permanently stored and available for review in the ultrasound unit.  Impression: Technically successful ultrasound guided injection.     Impression and Recommendations:     This case required medical decision making of moderate complexity.

## 2013-05-18 ENCOUNTER — Telehealth: Payer: Self-pay

## 2013-05-18 NOTE — Assessment & Plan Note (Signed)

## 2013-05-18 NOTE — Telephone Encounter (Signed)
Relevant patient education mailed to patient.  

## 2013-05-18 NOTE — Assessment & Plan Note (Signed)
stable overall by history and exam, recent data reviewed with pt, and pt to continue medical treatment as before,  to f/u any worsening symptoms or concerns Lab Results  Component Value Date   HGBA1C 6.0 05/17/2013

## 2013-05-18 NOTE — Assessment & Plan Note (Signed)
With right as well, for hydrocodone prn, hopefully short term, for sport med referral as well

## 2013-06-19 ENCOUNTER — Other Ambulatory Visit (INDEPENDENT_AMBULATORY_CARE_PROVIDER_SITE_OTHER): Payer: Medicare Other

## 2013-06-19 ENCOUNTER — Ambulatory Visit (INDEPENDENT_AMBULATORY_CARE_PROVIDER_SITE_OTHER): Payer: Medicare Other | Admitting: Family Medicine

## 2013-06-19 ENCOUNTER — Encounter: Payer: Self-pay | Admitting: Family Medicine

## 2013-06-19 VITALS — BP 120/70 | HR 48

## 2013-06-19 DIAGNOSIS — M25511 Pain in right shoulder: Secondary | ICD-10-CM

## 2013-06-19 DIAGNOSIS — M75101 Unspecified rotator cuff tear or rupture of right shoulder, not specified as traumatic: Secondary | ICD-10-CM

## 2013-06-19 DIAGNOSIS — M25519 Pain in unspecified shoulder: Secondary | ICD-10-CM

## 2013-06-19 DIAGNOSIS — M19019 Primary osteoarthritis, unspecified shoulder: Secondary | ICD-10-CM

## 2013-06-19 DIAGNOSIS — M12811 Other specific arthropathies, not elsewhere classified, right shoulder: Secondary | ICD-10-CM

## 2013-06-19 NOTE — Patient Instructions (Signed)
Good to see you both Lets continue the exercises 3 times a week is the goal Gave injection in right shoulder Bowling would be fine, max weight on ball 8#. See me when you need me.  Can repeat injection every 2-3 months.

## 2013-06-19 NOTE — Progress Notes (Signed)
Tawana ScaleZach Smith D.O. [facility] [address] [place], [place] [address] Phone: [phone] Subjective:     CC: Left shoulder pain follow up  VHQ:[id number]:Subjective Tracy [name] is a 80 y.o. female coming in for followup of her left shoulder pain. Patient found to have osteoarthritis with a degenerative rotator cuff tear with retraction previously. Patient did have an injection into the left shoulder and states that she had significant improvement in pain. Patient denies any radiation down the arm but still has some mild weakness. Patient has her son accompanied her has been doing overlifting at home which has been helpful. Patient states though that she is having very similar symptoms into the right shoulder. Patient did not notices because her left shoulder was hurting her so much. Patient states that it hurts with any type of overhead activity or even tried appear short on. Patient denies any numbness persistent pain seems to be constant. Patient would like another injection and decide if he can would be possible.     Past medical history, social, surgical and family history all reviewed in electronic medical record.   Review of Systems: No headache, visual changes, nausea, vomiting, diarrhea, constipation, dizziness, abdominal pain, skin rash, fevers, chills, night sweats, weight loss, swollen lymph nodes, body aches, joint swelling, muscle aches, chest pain, shortness of breath, mood changes.   Objective Blood pressure 120/70, pulse 48, SpO2 98.00%.  General: No apparent distress alert and oriented x3 mood and affect normal, dressed appropriately. Frail HEENT: Pupils equal, extraocular movements intact  Respiratory: Patient's speak in full sentences and does not appear short of breath  Cardiovascular: No lower extremity edema, non tender, no erythema  Skin: Warm dry intact with no signs of infection or rash on extremities or on axial skeleton.  Abdomen: Soft nontender  Neuro:  Cranial nerves II through XII are intact, neurovascularly intact in all extremities with 2+ DTRs and 2+ pulses.  Lymph: No lymphadenopathy of posterior or anterior cervical chain or axillae bilaterally.  Gait slow gait with some shoveling.  MSK:  Non tender with full range of motion and good stability and symmetric strength and tone of shoulders, elbows, wrist, hip, knee and ankles bilaterally. Patient though is very frail and does have osteoarthritic changes of multiple joints. Shoulder: Right Inspection reveals significant atrophy of the musculature bilaterally Palpation show some generalized tenderness ROM is full in all planes passively but does have severe crepitus Rotator cuff strength 4/5. Positive signs of impingement Speeds and Yergason's tests normal. No labral pathology noted with negative Obrien's, negative clunk and good stability. Normal scapular function observed. painful arc and no drop arm sign.  Contralateral shoulder has decreased pain but still has the same findings as stated above with right shoulder  but less strength  Procedure: Real-time Ultrasound Guided Injection of right glenohumeral joint Device: GE Logiq E  Ultrasound guided injection is preferred based studies that show increased duration, increased effect, greater accuracy, decreased procedural pain, increased response rate with ultrasound guided versus blind injection.  Verbal informed consent obtained.  Time-out conducted.  Noted no overlying erythema, induration, or other signs of local infection.  Skin prepped in a sterile fashion.  Local anesthesia: Topical Ethyl chloride.  With sterile technique and under real time ultrasound guidance:  Joint visualized.  23g 1  inch needle inserted posterior approach. Pictures taken for needle placement. Patient did have injection of 2 cc of 1% lidocaine, 2 cc of 0.5% Marcaine, and 1cc of Kenalog 40 mg/dL. Completed  without difficulty  Pain immediately resolved  suggesting accurate placement of the medication.  Advised to call if fevers/chills, erythema, induration, drainage, or persistent bleeding.  Images permanently stored and available for review in the ultrasound unit.  Impression: Technically successful ultrasound guided injection.     Impression and Recommendations:     This case required medical decision making of moderate complexity.

## 2013-06-19 NOTE — Assessment & Plan Note (Signed)
Patient had an injection in the shoulder today. Patient tolerated the procedure very well. Patient told to do the home exercises 3 times a week. Patient though does have significant rotator cuff arthropathy secondary to underlying arthritis and will continue with the over-the-counter medications. We can do injections every 23 months if necessary in this individual who has inoperable rotator cuff tears.

## 2013-07-04 ENCOUNTER — Ambulatory Visit: Payer: Medicare Other | Admitting: Internal Medicine

## 2013-07-31 ENCOUNTER — Other Ambulatory Visit: Payer: Self-pay | Admitting: Internal Medicine

## 2013-11-14 ENCOUNTER — Encounter: Payer: Self-pay | Admitting: Internal Medicine

## 2013-11-14 ENCOUNTER — Other Ambulatory Visit (INDEPENDENT_AMBULATORY_CARE_PROVIDER_SITE_OTHER): Payer: Medicare Other

## 2013-11-14 ENCOUNTER — Ambulatory Visit (INDEPENDENT_AMBULATORY_CARE_PROVIDER_SITE_OTHER): Payer: Medicare Other | Admitting: Internal Medicine

## 2013-11-14 VITALS — BP 124/70 | HR 58 | Temp 98.0°F | Wt 112.4 lb

## 2013-11-14 DIAGNOSIS — M12811 Other specific arthropathies, not elsewhere classified, right shoulder: Secondary | ICD-10-CM

## 2013-11-14 DIAGNOSIS — M75101 Unspecified rotator cuff tear or rupture of right shoulder, not specified as traumatic: Secondary | ICD-10-CM

## 2013-11-14 DIAGNOSIS — M19019 Primary osteoarthritis, unspecified shoulder: Secondary | ICD-10-CM

## 2013-11-14 DIAGNOSIS — E1165 Type 2 diabetes mellitus with hyperglycemia: Principal | ICD-10-CM

## 2013-11-14 DIAGNOSIS — E119 Type 2 diabetes mellitus without complications: Secondary | ICD-10-CM

## 2013-11-14 DIAGNOSIS — E785 Hyperlipidemia, unspecified: Secondary | ICD-10-CM

## 2013-11-14 DIAGNOSIS — I10 Essential (primary) hypertension: Secondary | ICD-10-CM

## 2013-11-14 DIAGNOSIS — IMO0001 Reserved for inherently not codable concepts without codable children: Secondary | ICD-10-CM

## 2013-11-14 LAB — HEMOGLOBIN A1C: Hgb A1c MFr Bld: 5.8 % (ref 4.6–6.5)

## 2013-11-14 LAB — LIPID PANEL
CHOL/HDL RATIO: 3
Cholesterol: 154 mg/dL (ref 0–200)
HDL: 52.2 mg/dL (ref >39.00)
LDL Cholesterol: 73 mg/dL (ref 0–99)
NONHDL: 101.8
Triglycerides: 142 mg/dL (ref 0.0–149.0)
VLDL: 28.4 mg/dL (ref 0.0–40.0)

## 2013-11-14 LAB — BASIC METABOLIC PANEL
BUN: 17 mg/dL (ref 6–23)
CHLORIDE: 105 meq/L (ref 96–112)
CO2: 33 meq/L — AB (ref 19–32)
Calcium: 10.3 mg/dL (ref 8.4–10.5)
Creatinine, Ser: 1 mg/dL (ref 0.4–1.2)
GFR: 70.25 mL/min (ref >60.00)
Glucose, Bld: 120 mg/dL — ABNORMAL HIGH (ref 70–99)
POTASSIUM: 4.5 meq/L (ref 3.5–5.1)
Sodium: 142 mEq/L (ref 135–145)

## 2013-11-14 LAB — HEPATIC FUNCTION PANEL
ALT: 16 U/L (ref 0–35)
AST: 26 U/L (ref 0–37)
Albumin: 4 g/dL (ref 3.5–5.2)
Alkaline Phosphatase: 33 U/L — ABNORMAL LOW (ref 39–117)
BILIRUBIN DIRECT: 0.1 mg/dL (ref 0.0–0.3)
BILIRUBIN TOTAL: 0.5 mg/dL (ref 0.2–1.2)
Total Protein: 7 g/dL (ref 6.0–8.3)

## 2013-11-14 MED ORDER — HYDROCODONE-ACETAMINOPHEN 5-325 MG PO TABS: ORAL_TABLET | ORAL | Status: DC

## 2013-11-14 NOTE — Patient Instructions (Signed)
Please continue all other medications as before, and refills have been done if requested - the pain medication  Please have the pharmacy call with any other refills you may need.  Please continue your efforts at being more active, low cholesterol diet, and weight control.  You are otherwise up to date with prevention measures today.  Please keep your appointments with your specialists as you may have planned  Please consider seeing Dr Katrinka BlazingSmith for the right shoulder as well  Please go to the LAB in the Basement (turn left off the elevator) for the tests to be done today  You will be contacted by phone if any changes need to be made immediately.  Otherwise, you will receive a letter about your results with an explanation, but please check with MyChart first.  Please remember to sign up for MyChart if you have not done so, as this will be important to you in the future with finding out test results, communicating by private email, and scheduling acute appointments online when needed.  Please return in 6 months, or sooner if needed

## 2013-11-14 NOTE — Progress Notes (Signed)
Subjective:    Patient ID: Tracy [name], female    DOB: 06/17/1926, 80 y.o.   MRN: [id number]  HPI Here to f/u, c/o ongoing right shoulder pain, s/p rot cuff surgury approx 10 yrs, worse in last few weeks without specific injury, worse to abduct/forward elevate, not better with anything, now mild to mod, daily intermittent.  Only taking the vicodin qd or qod prn, is considering seeing Dr Katrinka BlazingSmith, as improved with the left shoulder recent tx as well.  Pt denies chest pain, increased sob or doe, wheezing, orthopnea, PND, increased LE swelling, palpitations, dizziness or syncope.  Pt denies polydipsia, polyuria, or low sugar symptoms such as weakness or confusion improved with po intake.  Pt denies new neurological symptoms such as new headache, or facial or extremity weakness or numbness.   Pt states overall good compliance with meds, has been trying to follow lower cholesterol, diabetic diet, with wt overall stable. Past Medical History  Diagnosis Date  . Hyperlipidemia   . HTN (hypertension)   . GERD (gastroesophageal reflux disease)   . CVA (cerebrovascular accident) 2002  . Osteopenia   . Hx of adenomatous colonic polyps 2007  . Diverticulosis of colon   . DM (diabetes mellitus), type 2   . PVD (peripheral vascular disease)     recently dx w/ carotid -left  int carotid >90% 50-60% rt int carotid 02/2010  . PVD (peripheral vascular disease)     lf  ABI 0.11 and rt 0.43  . CAROTID STENOSIS   . DIABETES MELLITUS, TYPE II   . DIVERTICULOSIS, COLON   . GERD   . HYPERLIPIDEMIA   . OSTEOPENIA   . Atrial fibrillation   . DVT (deep venous thrombosis)   . Sinus node dysfunction 2011   Past Surgical History  Procedure Laterality Date  . Cns vascular stent per neurosurgeon    . Tubal ligation    . Right shoulder surgery  2010  . Cataract eye surgery    . Lazer eye surgery    . Iliac artery stent  2011    Bilateral CIA stent    reports that she quit smoking about 22 years ago. She  does not have any smokeless tobacco history on file. She reports that she does not drink alcohol or use illicit drugs. family history includes Cancer in her mother; Diabetes in her sister. No Known Allergies Current Outpatient Prescriptions on File Prior to Visit  Medication Sig Dispense Refill  . amLODipine-benazepril (LOTREL) 10-40 MG per capsule Take 1 capsule by mouth daily.  90 capsule  3  . aspirin 81 MG tablet Take 81 mg by mouth daily.        . Calcium Carbonate (CALTRATE 600) 1500 MG TABS Take by mouth 2 (two) times daily.        . clopidogrel (PLAVIX) 75 MG tablet TAKE 1 TABLET (75 MG TOTAL) BY MOUTH DAILY.  90 tablet  3  . clopidogrel (PLAVIX) 75 MG tablet TAKE 1 TABLET (75 MG TOTAL) BY MOUTH DAILY.  90 tablet  3  . fenofibrate micronized (LOFIBRA) 134 MG capsule TAKE 1 CAPSULE BY MOUTH EVERY DAY  90 capsule  3  . latanoprost (XALATAN) 0.005 % ophthalmic solution Place into both eyes daily.      . Multiple Vitamin (MULTIVITAMIN) capsule Take 1 capsule by mouth daily.        . pentoxifylline (TRENTAL) 400 MG CR tablet Take 1 tablet (400 mg total) by mouth daily.  90 tablet  3   No current facility-administered medications on file prior to visit.    Review of Systems  Constitutional: Negative for unusual diaphoresis or other sweats  HENT: Negative for ringing in ear Eyes: Negative for double vision or worsening visual disturbance.  Respiratory: Negative for choking and stridor.   Gastrointestinal: Negative for vomiting or other signifcant bowel change Genitourinary: Negative for hematuria or decreased urine volume.  Musculoskeletal: Negative for other MSK pain or swelling Skin: Negative for color change and worsening wound.  Neurological: Negative for tremors and numbness other than noted  Psychiatric/Behavioral: Negative for decreased concentration or agitation other than above       Objective:   Physical Exam BP 124/70  Pulse 58  Temp(Src) 98 F (36.7 C) (Oral)  Wt 112  lb 6 oz (50.973 kg)  SpO2 96% VS noted,  Constitutional: Pt appears well-developed, well-nourished.  HENT: Head: NCAT.  Right Ear: External ear normal.  Left Ear: External ear normal.  Eyes: . Pupils are equal, round, and reactive to light. Conjunctivae and EOM are normal Neck: Normal range of motion. Neck supple.  Cardiovascular: Normal rate and regular rhythm.   Pulmonary/Chest: Effort normal and breath sounds normal.  Neurological: Pt is alert. Not confused , motor grossly intact Skin: Skin is warm. No rash Psychiatric: Pt behavior is normal. No agitation.  Right shoudler with decr ROM to forward elev, abduction with pain on ROM    Assessment & Plan:

## 2013-11-14 NOTE — Progress Notes (Signed)
Pre visit review using our clinic review tool, if applicable. No additional management support is needed unless otherwise documented below in the visit note. 

## 2013-11-18 NOTE — Assessment & Plan Note (Signed)
stable overall by history and exam, recent data reviewed with pt, and pt to continue medical treatment as before,  to f/u any worsening symptoms or concerns BP Readings from Last 3 Encounters:  11/14/13 124/70  06/19/13 120/70  05/17/13 106/60

## 2013-11-18 NOTE — Assessment & Plan Note (Signed)
stable overall by history and exam, recent data reviewed with pt, and pt to continue medical treatment as before,  to f/u any worsening symptoms or concerns / Lab Results  Component Value Date   HGBA1C 5.8 11/14/2013    

## 2013-11-18 NOTE — Assessment & Plan Note (Signed)
Probable, encouraged pt to make appt with Dr smith/sport med

## 2013-11-18 NOTE — Assessment & Plan Note (Signed)
stable overall by history and exam, recent data reviewed with pt, and pt to continue medical treatment as before,  to f/u any worsening symptoms or concerns Lab Results  Component Value Date   LDLCALC 73 11/14/2013

## 2014-01-29 ENCOUNTER — Encounter: Payer: Self-pay | Admitting: Internal Medicine

## 2014-01-29 ENCOUNTER — Ambulatory Visit: Payer: Medicare Other | Admitting: Internal Medicine

## 2014-01-29 ENCOUNTER — Ambulatory Visit (INDEPENDENT_AMBULATORY_CARE_PROVIDER_SITE_OTHER): Payer: Medicare Other | Admitting: Internal Medicine

## 2014-01-29 VITALS — BP 142/90 | HR 61 | Temp 97.9°F | Ht 59.0 in | Wt 112.1 lb

## 2014-01-29 DIAGNOSIS — I10 Essential (primary) hypertension: Secondary | ICD-10-CM

## 2014-01-29 DIAGNOSIS — E119 Type 2 diabetes mellitus without complications: Secondary | ICD-10-CM

## 2014-01-29 DIAGNOSIS — M25571 Pain in right ankle and joints of right foot: Secondary | ICD-10-CM

## 2014-01-29 NOTE — Assessment & Plan Note (Signed)
Pain with pins moveement?  For ortho referral = dr hewitt/ortho

## 2014-01-29 NOTE — Progress Notes (Signed)
Pre visit review using our clinic review tool, if applicable. No additional management support is needed unless otherwise documented below in the visit note. 

## 2014-01-29 NOTE — Assessment & Plan Note (Signed)
Mild elev today but overall stable overall by history and exam, recent data reviewed with pt, and pt to continue medical treatment as before,  to f/u any worsening symptoms or concerns BP Readings from Last 3 Encounters:  01/29/14 142/90  11/14/13 124/70  06/19/13 120/70

## 2014-01-29 NOTE — Progress Notes (Signed)
Subjective:    Patient ID: Tracy [name], female    DOB: [birth year], 80 y.o.   MRN: [id number]  HPI  Here to f/u, mention hx of right ankle fx in 1995 with pins, now with in creased pain and pins seem to stick out more. No falls or other injury recently.  Pt denies chest pain, increased sob or doe, wheezing, orthopnea, PND, increased LE swelling, palpitations, dizziness or syncope. Pt denies new neurological symptoms such as new headache, or facial or extremity weakness or numbness   Pt denies polydipsia, polyuria.     Past Medical History  Diagnosis Date  . Hyperlipidemia   . HTN (hypertension)   . GERD (gastroesophageal reflux disease)   . CVA (cerebrovascular accident) 2002  . Osteopenia   . Hx of adenomatous colonic polyps 2007  . Diverticulosis of colon   . DM (diabetes mellitus), type 2   . PVD (peripheral vascular disease)     recently dx w/ carotid -left  int carotid >90% 50-60% rt int carotid 02/2010  . PVD (peripheral vascular disease)     lf  ABI 0.11 and rt 0.43  . CAROTID STENOSIS   . DIABETES MELLITUS, TYPE II   . DIVERTICULOSIS, COLON   . GERD   . HYPERLIPIDEMIA   . OSTEOPENIA   . Atrial fibrillation   . DVT (deep venous thrombosis)   . Sinus node dysfunction 2011   Past Surgical History  Procedure Laterality Date  . Cns vascular stent per neurosurgeon    . Tubal ligation    . Right shoulder surgery  2010  . Cataract eye surgery    . Lazer eye surgery    . Iliac artery stent  2011    Bilateral CIA stent    reports that she quit smoking about 22 years ago. She does not have any smokeless tobacco history on file. She reports that she does not drink alcohol or use illicit drugs. family history includes Cancer in her mother; Diabetes in her sister. No Known Allergies Current Outpatient Prescriptions on File Prior to Visit  Medication Sig Dispense Refill  . amLODipine-benazepril (LOTREL) 10-40 MG per capsule Take 1 capsule by mouth daily.  90 capsule  3    . aspirin 81 MG tablet Take 81 mg by mouth daily.        . Calcium Carbonate (CALTRATE 600) 1500 MG TABS Take by mouth 2 (two) times daily.        . clopidogrel (PLAVIX) 75 MG tablet TAKE 1 TABLET (75 MG TOTAL) BY MOUTH DAILY.  90 tablet  3  . clopidogrel (PLAVIX) 75 MG tablet TAKE 1 TABLET (75 MG TOTAL) BY MOUTH DAILY.  90 tablet  3  . fenofibrate micronized (LOFIBRA) 134 MG capsule TAKE 1 CAPSULE BY MOUTH EVERY DAY  90 capsule  3  . HYDROcodone-acetaminophen (NORCO/VICODIN) 5-325 MG per tablet TAKE 1 TABLET EVERY 6 HOURS AS NEEDED FOR PAIN  120 tablet  0  . latanoprost (XALATAN) 0.005 % ophthalmic solution Place into both eyes daily.      . Multiple Vitamin (MULTIVITAMIN) capsule Take 1 capsule by mouth daily.        . pentoxifylline (TRENTAL) 400 MG CR tablet Take 1 tablet (400 mg total) by mouth daily.  90 tablet  3   No current facility-administered medications on file prior to visit.   Review of Systems All otherwise neg per pt    Objective:   Physical Exam BP 142/90  Pulse 61  Temp(Src) 97.9 F (36.6 C) (Oral)  Ht 4\' 11"  (1.499 m)  Wt 112 lb 2 oz (50.86 kg)  BMI 22.63 kg/m2  SpO2 96% VS noted,  Constitutional: Pt appears well-developed, well-nourished.  HENT: Head: NCAT.  Right Ear: External ear normal.  Left Ear: External ear normal.  Eyes: . Pupils are equal, round, and reactive to light. Conjunctivae and EOM are normal Neck: Normal range of motion. Neck supple.  Cardiovascular: Normal rate and regular rhythm.   Pulmonary/Chest: Effort normal and breath sounds normal.  Neurological: Pt is alert. Not confused , motor grossly intact Skin: Skin is warm. No rash Psychiatric: Pt behavior is normal. No agitation.  Right ankle tender with no swelling, but palpable pins. O/w neurovasc intact    Assessment & Plan:

## 2014-01-29 NOTE — Patient Instructions (Signed)
Please continue all other medications as before, and refills have been done if requested.  Please have the pharmacy call with any other refills you may need.  Please keep your appointments with your specialists as you may have planned  You will be contacted regarding the referral for: orthopedic

## 2014-01-30 ENCOUNTER — Telehealth: Payer: Self-pay | Admitting: Internal Medicine

## 2014-01-30 NOTE — Telephone Encounter (Signed)
emmi mailed  °

## 2014-02-02 NOTE — Assessment & Plan Note (Signed)
Lab Results  Component Value Date   HGBA1C 5.8 11/14/2013   stable overall by history and exam, recent data reviewed with pt, and pt to continue medical treatment as before,  to f/u any worsening symptoms or concerns

## 2014-03-25 ENCOUNTER — Encounter: Payer: Self-pay | Admitting: Family

## 2014-03-25 ENCOUNTER — Telehealth: Payer: Self-pay | Admitting: Internal Medicine

## 2014-03-25 NOTE — Telephone Encounter (Signed)
Will hold until md return tomorrow.../lmb 

## 2014-03-25 NOTE — Telephone Encounter (Signed)
Pt called in an is requesting refill on HYDROcodone-acetaminophen (NORCO/VICODIN) 5-325 MG per tablet [[id number]][[id number]]

## 2014-03-26 ENCOUNTER — Ambulatory Visit (INDEPENDENT_AMBULATORY_CARE_PROVIDER_SITE_OTHER): Payer: Medicare Other | Admitting: Family

## 2014-03-26 ENCOUNTER — Ambulatory Visit (INDEPENDENT_AMBULATORY_CARE_PROVIDER_SITE_OTHER)
Admission: RE | Admit: 2014-03-26 | Discharge: 2014-03-26 | Disposition: A | Discharge status: Home / Self Care | Payer: Medicare Other | Source: Ambulatory Visit | Attending: Family | Admitting: Family

## 2014-03-26 ENCOUNTER — Ambulatory Visit (HOSPITAL_COMMUNITY)
Admission: RE | Admit: 2014-03-26 | Discharge: 2014-03-26 | Disposition: A | Discharge status: Home / Self Care | Payer: Medicare Other | Source: Ambulatory Visit | Attending: Family | Admitting: Family

## 2014-03-26 ENCOUNTER — Encounter: Payer: Self-pay | Admitting: Family

## 2014-03-26 VITALS — BP 100/62 | HR 48 | Resp 14 | Ht <= 58 in | Wt 111.0 lb

## 2014-03-26 DIAGNOSIS — I6523 Occlusion and stenosis of bilateral carotid arteries: Secondary | ICD-10-CM

## 2014-03-26 DIAGNOSIS — Z9889 Other specified postprocedural states: Secondary | ICD-10-CM

## 2014-03-26 DIAGNOSIS — I6521 Occlusion and stenosis of right carotid artery: Secondary | ICD-10-CM

## 2014-03-26 DIAGNOSIS — I739 Peripheral vascular disease, unspecified: Secondary | ICD-10-CM

## 2014-03-26 DIAGNOSIS — Z48812 Encounter for surgical aftercare following surgery on the circulatory system: Secondary | ICD-10-CM

## 2014-03-26 DIAGNOSIS — Z95828 Presence of other vascular implants and grafts: Secondary | ICD-10-CM

## 2014-03-26 MED ORDER — HYDROCODONE-ACETAMINOPHEN 5-325 MG PO TABS: ORAL_TABLET | ORAL | Status: DC

## 2014-03-26 NOTE — Telephone Encounter (Signed)
Notified pt rx ready for pick-up.../lmb 

## 2014-03-26 NOTE — Telephone Encounter (Signed)
Done hardcopy to robin  

## 2014-03-26 NOTE — Patient Instructions (Signed)

## 2014-03-26 NOTE — Progress Notes (Signed)
[facility] [place] HISTORY AND PHYSICAL   MRN : [id number]  History of Present Illness:   Tracy [name] is a 80 y.o. female patient of Dr. Hart RochesterLawson status who is post bilateral common iliac and left external iliac stents placed in 2011. She returns today for carotid Duplex, ABI's, and aorto-iliac Duplex evaluation.  Patient has Positive history of TIA or stroke symptom about 2004 as manifested by repeating the same several phrases which she did realize, lasted about 3-4 days, has had no further stroke symptoms. The patient denies amaurosis fugax or monocular blindness. The patient denies facial drooping. Pt. denies hemiplegia. The patient denies receptive or expressive aphasia.  The patient has not had back or abdominal pain.  Her main complaint is legs weakness, from hips distally, after walking about 30 minutes, denies non healing wounds. Has atrial fib.  Pt Diabetic: No, patient denies having even though it is listed in her PMHX. Pt smoker: former smoker, quit 25-30 years ago  Pt meds include: Statin :No ASA: Yes Other anticoagulants/antiplatelets: Plavix   Current Outpatient Prescriptions  Medication Sig Dispense Refill  . amLODipine-benazepril (LOTREL) 10-40 MG per capsule Take 1 capsule by mouth daily. 90 capsule 3  . aspirin 81 MG tablet Take 81 mg by mouth daily.      . Calcium Carbonate (CALTRATE 600) 1500 MG TABS Take by mouth 2 (two) times daily.      . clopidogrel (PLAVIX) 75 MG tablet TAKE 1 TABLET (75 MG TOTAL) BY MOUTH DAILY. 90 tablet 3  . fenofibrate micronized (LOFIBRA) 134 MG capsule TAKE 1 CAPSULE BY MOUTH EVERY DAY 90 capsule 3  . HYDROcodone-acetaminophen (NORCO/VICODIN) 5-325 MG per tablet TAKE 1 TABLET EVERY 6 HOURS AS NEEDED FOR PAIN 120 tablet 0  . latanoprost (XALATAN) 0.005 % ophthalmic solution Place into both eyes daily.    . Multiple Vitamin (MULTIVITAMIN) capsule Take 1 capsule by mouth daily.      . pentoxifylline  (TRENTAL) 400 MG CR tablet Take 1 tablet (400 mg total) by mouth daily. 90 tablet 3   No current facility-administered medications for this visit.    Past Medical History  Diagnosis Date  . Hyperlipidemia   . HTN (hypertension)   . GERD (gastroesophageal reflux disease)   . CVA (cerebrovascular accident) 2002  . Osteopenia   . Hx of adenomatous colonic polyps 2007  . Diverticulosis of colon   . DM (diabetes mellitus), type 2   . PVD (peripheral vascular disease)     recently dx w/ carotid -left  int carotid >90% 50-60% rt int carotid 02/2010  . PVD (peripheral vascular disease)     lf  ABI 0.11 and rt 0.43  . CAROTID STENOSIS   . DIABETES MELLITUS, TYPE II   . DIVERTICULOSIS, COLON   . GERD   . HYPERLIPIDEMIA   . OSTEOPENIA   . Atrial fibrillation   . DVT (deep venous thrombosis)   . Sinus node dysfunction 2011    Social History History  Substance Use Topics  . Smoking status: Former Smoker    Quit date: 04/06/1991  . Smokeless tobacco: Not on file  . Alcohol Use: No    Family History Family History  Problem Relation Age of Onset  . Cancer Mother     colon  . Diabetes Sister     Amputation    Surgical History Past Surgical History  Procedure Laterality Date  . Cns vascular stent per neurosurgeon    . Tubal ligation    .  Right shoulder surgery  2010  . Cataract eye surgery    . Lazer eye surgery    . Iliac artery stent  2011    Bilateral CIA stent    No Known Allergies  Current Outpatient Prescriptions  Medication Sig Dispense Refill  . amLODipine-benazepril (LOTREL) 10-40 MG per capsule Take 1 capsule by mouth daily. 90 capsule 3  . aspirin 81 MG tablet Take 81 mg by mouth daily.      . Calcium Carbonate (CALTRATE 600) 1500 MG TABS Take by mouth 2 (two) times daily.      . clopidogrel (PLAVIX) 75 MG tablet TAKE 1 TABLET (75 MG TOTAL) BY MOUTH DAILY. 90 tablet 3  . fenofibrate micronized (LOFIBRA) 134 MG capsule TAKE 1 CAPSULE BY MOUTH EVERY DAY 90  capsule 3  . HYDROcodone-acetaminophen (NORCO/VICODIN) 5-325 MG per tablet TAKE 1 TABLET EVERY 6 HOURS AS NEEDED FOR PAIN 120 tablet 0  . latanoprost (XALATAN) 0.005 % ophthalmic solution Place into both eyes daily.    . Multiple Vitamin (MULTIVITAMIN) capsule Take 1 capsule by mouth daily.      . pentoxifylline (TRENTAL) 400 MG CR tablet Take 1 tablet (400 mg total) by mouth daily. 90 tablet 3   No current facility-administered medications for this visit.     REVIEW OF SYSTEMS: See HPI for pertinent positives and negatives.  Physical Examination Filed Vitals:   03/26/14 1052 03/26/14 1102  BP: 120/62 100/62  Pulse: 48 48  Resp:  14  Height:  4\' 10"  (1.473 m)  Weight:  111 lb (50.349 kg)  SpO2:  100%   Body mass index is 23.21 kg/(m^2).  General: WDWN in NAD Gait: Normal HENT: WNL Eyes: Pupils equal Pulmonary: normal non-labored breathing , without Rales, rhonchi, wheezing Cardiac: Irregular rhythm, with outdetected murmur. No carotid bruits Abdomen: soft, NT, no palpable masses Skin: no rashes, no ulcers; no Gangrene , no cellulitis; no open wounds.   VASCULAR EXAM: Extremities without ischemic changes  without Gangrene; without open wounds.     LE Pulses Left RIGHT   FEMORAL 1+palpable not palpable    POPLITEAL Not palpable not palpable   POSTERIOR TIBIAL Not palpable not palpable    DORSALIS PEDIS  ANTERIOR TIBIAL Not palpable not palpable      Musculoskeletal: no muscle wasting or atrophy; no edema Neurologic: A&O X 3; Appropriate Affect ;  SENSATION: normal; MOTOR FUNCTION: 5/5 Symmetric, CN 2-12 intact Speech is fluent/normal   Significant Diagnostic Studies: Head CT of 02/23/13:  Remote moderate to large size occipital lobe infarcts with   encephalomalacia more notable on the left. Remote appearing moderate  size left cerebellar infarct.  Small infarcts of the thalamus bilaterally, caudate head bilaterally  and right paracentral pontine region of questionable age.  Prior MR made reference to bilateral occipital lobe, bilateral  caudate head and thalamic infarcts.  Small vessel disease type changes.  No intracranial enhancing lesion.  No intracranial hemorrhage.  Global atrophy without hydrocephalus.  Prominent vascular calcifications with narrowing of the vertebral  arteries and carotid arteries.     Non-Invasive Vascular Imaging (03/26/2014):   ILIAC ARTERY STENT EVALUATION    INDICATION: PVD    PREVIOUS INTERVENTION(S): Bilateral common iliac and external iliac artery posterior tibial artery and stent placement 02/27/2010    DUPLEX EXAM:     RIGHT  LEFT   Peak Systolic Velocity (cm/s) Ratio (if abnormal) Waveform  Peak Systolic Velocity (cm/s) Ratio (if abnormal) Waveform  59 - M Aorta - Distal  59 CIA M  N/V CIA - Artery - Proximal to Stent 48 - B  77 - M Stent - Proximal 76 - B  103 - M Stent - Mid 103 - B  84 - M Stent - Distal 87 - B  84 - M Artery - Distal to Stent 73 - B  .49 Today's ABI / TBI .50  .61 Previous ABI / TBI (  ) .48    Waveform:    M - Monophasic       B - Biphasic       T - Triphasic  If Ankle Brachial Index (ABI) or Toe Brachial Index (TBI) performed, please see complete report     ADDITIONAL FINDINGS: Right common femoral artery 87 cms monophasic waveform, left common femoral artery 27 cms,  brisk monophasic waveform.    IMPRESSION: 1. Technically difficult exam due to excessive bowel gas. 2. The bilateral common iliac artery stents appear patent. 3.  The bilateral external iliac artery stents not well visualized but appear patent.    Compared to the previous exam:  Unable to  obtain increased velocity in stents as demonstrated on prior exams     Carotid  Duplex: CEREBROVASCULAR DUPLEX EVALUATION    INDICATION: Carotid artery disease    PREVIOUS INTERVENTION(S): Left carotid endarterectomy 04/30/2010    DUPLEX EXAM: Carotid duplex    RIGHT  LEFT  Peak Systolic Velocities (cm/s) End Diastolic Velocities (cm/s) Plaque LOCATION Peak Systolic Velocities (cm/s) End Diastolic Velocities (cm/s) Plaque  69 7 HT CCA PROXIMAL 77 16 HT  74 14 HT CCA MID 88 20 HT  90 14 CP CCA DISTAL 106 21 HT  135 13 CP ECA 73 6 -  206 58 CP ICA PROXIMAL 58 9 HT  143 26 - ICA MID 63 24 -  128 22 - ICA DISTAL 79 20 -    2.7 ICA / CCA Ratio (PSV) N/A  Antegrade Vertebral Flow Antegrade  157 Brachial Systolic Pressure (mmHg) 156  Triphasic Brachial Artery Waveforms Triphasic    Plaque Morphology:  HM = Homogeneous, HT = Heterogeneous, CP = Calcific Plaque, SP = Smooth Plaque, IP = Irregular Plaque     ADDITIONAL FINDINGS:     IMPRESSION: 1. 40 - 59% right internal carotid artery stenosis, calcific plaque may obscure higher velocity. 2. Patent left carotid endarterectomy site with no evidence for restenosis.    Compared to the previous exam:  Increased velocity in the right internal carotid artery     ASSESSMENT:  Tracy FinerOphelia W [name] is a [age] y.o. female who is status post bilateral common iliac and left external iliac stents placed in 2011. She has a history of stroke in 2004 with no residual neurological deficit and no further strokes or TIA's. She has moderate claudication with walking, no rest pain, not life limiting, no tissue loss.  Today's carotid Duplex reveals 40 - 59% right internal carotid artery stenosis, calcific plaque may obscure higher velocity and a patent left carotid endarterectomy site with no evidence for restenosis. Increased velocity in the right internal carotid artery compared to previous Duplex.  Today's bilateral iliac artery stent Duplex reveals a technically difficult exam due to excessive bowel gas. The bilateral common iliac  artery stents appear patent.  The bilateral external iliac artery stents not well visualized but appear patent. Unable to  obtain increased velocity in stents as demonstrated on prior exams.  Consider statin therapy which is associated with a greater reduction in CVD risk and  improved endothelial function , will defer to PCP.    PLAN:   Based on today's exam and non-invasive vascular lab results, and after discussing with Dr. Hart Rochester, the patient will follow up in 1 year for Carotid Duplex, ABI's, and iliac artery stent evaluation. I discussed in depth with the patient the nature of atherosclerosis, and emphasized the importance of maximal medical management including strict control of blood pressure, blood glucose, and lipid levels, obtaining regular exercise, and cessation of smoking.  The patient is aware that without maximal medical management the underlying atherosclerotic disease process will progress, limiting the benefit of any interventions.  The patient was given information about stroke prevention and what symptoms should prompt the patient to seek immediate medical care.  The patient was given information about PAD including signs, symptoms, treatment, what symptoms should prompt the patient to seek immediate medical care, and risk reduction measures to take. Thank you for allowing [place] to participate in this patient's care.  Charisse March, RN, MSN, FNP-C [facility] Office: [phone]  Clinic MD: Early 03/26/2014 10:45 AM

## 2014-05-02 ENCOUNTER — Other Ambulatory Visit: Payer: Self-pay | Admitting: Internal Medicine

## 2014-05-17 ENCOUNTER — Encounter: Payer: Self-pay | Admitting: Internal Medicine

## 2014-05-17 ENCOUNTER — Ambulatory Visit (INDEPENDENT_AMBULATORY_CARE_PROVIDER_SITE_OTHER): Payer: Medicare Other | Admitting: Internal Medicine

## 2014-05-17 VITALS — BP 114/64 | HR 48 | Temp 97.5°F | Ht <= 58 in | Wt 110.0 lb

## 2014-05-17 DIAGNOSIS — E785 Hyperlipidemia, unspecified: Secondary | ICD-10-CM | POA: Diagnosis not present

## 2014-05-17 DIAGNOSIS — I10 Essential (primary) hypertension: Secondary | ICD-10-CM

## 2014-05-17 DIAGNOSIS — E119 Type 2 diabetes mellitus without complications: Secondary | ICD-10-CM | POA: Diagnosis not present

## 2014-05-17 DIAGNOSIS — I495 Sick sinus syndrome: Secondary | ICD-10-CM

## 2014-05-17 DIAGNOSIS — Z0189 Encounter for other specified special examinations: Secondary | ICD-10-CM | POA: Diagnosis not present

## 2014-05-17 DIAGNOSIS — Z0000 Encounter for general adult medical examination without abnormal findings: Secondary | ICD-10-CM

## 2014-05-17 NOTE — Progress Notes (Signed)
Subjective:    Patient ID: Tracy [name], female    DOB: Aug 06, 1926, 81 y.o.   MRN: [id number]  HPI  Here to f/u; overall doing ok,  Pt denies chest pain, increased sob or doe, wheezing, orthopnea, PND, increased LE swelling, palpitations, dizziness or syncope.  Pt denies polydipsia, polyuria, or low sugar symptoms such as weakness or confusion improved with po intake.  Pt denies new neurological symptoms such as new headache, or facial or extremity weakness or numbness.   Pt states overall good compliance with meds, has been trying to follow lower cholesterol, diabetic diet, with wt overall stable,  but little exercise however. No current compalints. Has stable LE claudication symptoms.  Overall good compliance with treatment, and good medicine tolerability. No current complaints. Former smoker Past Medical History  Diagnosis Date  . Hyperlipidemia   . HTN (hypertension)   . GERD (gastroesophageal reflux disease)   . CVA (cerebrovascular accident) 2002  . Osteopenia   . Hx of adenomatous colonic polyps 2007  . Diverticulosis of colon   . DM (diabetes mellitus), type 2   . PVD (peripheral vascular disease)     recently dx w/ carotid -left  int carotid >90% 50-60% rt int carotid 02/2010  . PVD (peripheral vascular disease)     lf  ABI 0.11 and rt 0.43  . CAROTID STENOSIS   . DIABETES MELLITUS, TYPE II   . DIVERTICULOSIS, COLON   . GERD   . HYPERLIPIDEMIA   . OSTEOPENIA   . Atrial fibrillation   . DVT (deep venous thrombosis)   . Sinus node dysfunction 2011   Past Surgical History  Procedure Laterality Date  . Cns vascular stent per neurosurgeon    . Tubal ligation    . Right shoulder surgery  2010  . Cataract eye surgery    . Lazer eye surgery    . Iliac artery stent  2011    Bilateral CIA stent    reports that she quit smoking about 23 years ago. She has never used smokeless tobacco. She reports that she does not drink alcohol or use illicit drugs. family history  includes Cancer in her mother; Diabetes in her sister. No Known Allergies Current Outpatient Prescriptions on File Prior to Visit  Medication Sig Dispense Refill  . amLODipine-benazepril (LOTREL) 10-40 MG per capsule Take 1 capsule by mouth daily. 90 capsule 3  . aspirin 81 MG tablet Take 81 mg by mouth daily.      . Calcium Carbonate (CALTRATE 600) 1500 MG TABS Take by mouth 2 (two) times daily.      . clopidogrel (PLAVIX) 75 MG tablet TAKE 1 TABLET (75 MG TOTAL) BY MOUTH DAILY. 90 tablet 3  . fenofibrate micronized (LOFIBRA) 134 MG capsule TAKE 1 CAPSULE (134 MG TOTAL) BY MOUTH DAILY. 90 capsule 3  . HYDROcodone-acetaminophen (NORCO/VICODIN) 5-325 MG per tablet TAKE 1 TABLET EVERY 6 HOURS AS NEEDED FOR PAIN 120 tablet 0  . latanoprost (XALATAN) 0.005 % ophthalmic solution Place into both eyes daily.    . Multiple Vitamin (MULTIVITAMIN) capsule Take 1 capsule by mouth daily. Centrum Silver    . pentoxifylline (TRENTAL) 400 MG CR tablet TAKE 1 TABLET (400 MG TOTAL) BY MOUTH DAILY. 90 tablet 3   No current facility-administered medications on file prior to visit.   Review of Systems  Constitutional: Negative for unusual diaphoresis or other sweats  HENT: Negative for ringing in ear Eyes: Negative for double vision or worsening visual disturbance.  Respiratory:  Negative for choking and stridor.   Gastrointestinal: Negative for vomiting or other signifcant bowel change Genitourinary: Negative for hematuria or decreased urine volume.  Musculoskeletal: Negative for other MSK pain or swelling Skin: Negative for color change and worsening wound.  Neurological: Negative for tremors and numbness other than noted  Psychiatric/Behavioral: Negative for decreased concentration or agitation other than above       Objective:   Physical Exam BP 114/64 mmHg  Pulse 48  Temp(Src) 97.5 F (36.4 C) (Oral)  Ht  (1.473 m)  Wt 110 lb (49.896 kg)  BMI 23.00 kg/m2 VS noted, not ill  appearing Constitutional: Pt appears well-developed, well-nourished.  HENT: Head: NCAT.  Right Ear: External ear normal.  Left Ear: External ear normal.  Eyes: . Pupils are equal, round, and reactive to light. Conjunctivae and EOM are normal Neck: Normal range of motion. Neck supple.  Cardiovascular: Normal rate and regular rhythm.   Pulmonary/Chest: Effort normal and breath sounds without rales or wheezing.  Neurological: Pt is alert. Not confused , motor grossly intact Skin: Skin is warm. No rash Psychiatric: Pt behavior is normal. No agitation.     Assessment & Plan:

## 2014-05-17 NOTE — Progress Notes (Signed)
Pre visit review using our clinic review tool, if applicable. No additional management support is needed unless otherwise documented below in the visit note. 

## 2014-05-17 NOTE — Patient Instructions (Signed)
Please continue all other medications as before, and refills have been done if requested.  Please have the pharmacy call with any other refills you may need.  Please continue your efforts at being more active, low cholesterol diet, and weight control.  You are otherwise up to date with prevention measures today.  Please keep your appointments with your specialists as you may have planned  Please return in 6 months, or sooner if needed, with Lab testing done 3-5 days before  

## 2014-05-17 NOTE — Assessment & Plan Note (Signed)
stable overall by history and exam, recent data reviewed with pt, and pt to continue medical treatment as before,  to f/u any worsening symptoms or concerns BP Readings from Last 3 Encounters:  05/17/14 114/64  03/26/14 100/62  01/29/14 142/90

## 2014-05-17 NOTE — Assessment & Plan Note (Signed)
With bradycardia today, asympt, has refused PPM in past,  to f/u any worsening symptoms or concerns

## 2014-05-17 NOTE — Assessment & Plan Note (Signed)
stable overall by history and exam, recent data reviewed with pt, and pt to continue medical treatment as before,  to f/u any worsening symptoms or concerns Lab Results  Component Value Date   HGBA1C 5.8 11/14/2013

## 2014-05-17 NOTE — Assessment & Plan Note (Signed)
stable overall by history and exam, recent data reviewed with pt, and pt to continue medical treatment as before,  to f/u any worsening symptoms or concerns Lab Results  Component Value Date   LDLCALC 73 11/14/2013

## 2014-07-01 ENCOUNTER — Inpatient Hospital Stay (HOSPITAL_COMMUNITY): Payer: Medicare Other

## 2014-07-01 ENCOUNTER — Inpatient Hospital Stay (HOSPITAL_COMMUNITY)
Admission: EM | Admit: 2014-07-01 | Discharge: 2014-07-04 | DRG: 065 | Disposition: A | Discharge status: Rehabilitation Facility | Payer: Medicare Other | Attending: Internal Medicine | Admitting: Internal Medicine

## 2014-07-01 ENCOUNTER — Emergency Department (HOSPITAL_COMMUNITY): Payer: Medicare Other

## 2014-07-01 ENCOUNTER — Encounter (HOSPITAL_COMMUNITY): Payer: Self-pay

## 2014-07-01 DIAGNOSIS — G8194 Hemiplegia, unspecified affecting left nondominant side: Secondary | ICD-10-CM | POA: Diagnosis not present

## 2014-07-01 DIAGNOSIS — R778 Other specified abnormalities of plasma proteins: Secondary | ICD-10-CM | POA: Diagnosis present

## 2014-07-01 DIAGNOSIS — I495 Sick sinus syndrome: Secondary | ICD-10-CM | POA: Diagnosis present

## 2014-07-01 DIAGNOSIS — Z79899 Other long term (current) drug therapy: Secondary | ICD-10-CM | POA: Diagnosis not present

## 2014-07-01 DIAGNOSIS — R7989 Other specified abnormal findings of blood chemistry: Secondary | ICD-10-CM | POA: Diagnosis not present

## 2014-07-01 DIAGNOSIS — R531 Weakness: Secondary | ICD-10-CM | POA: Diagnosis not present

## 2014-07-01 DIAGNOSIS — Z7982 Long term (current) use of aspirin: Secondary | ICD-10-CM | POA: Diagnosis not present

## 2014-07-01 DIAGNOSIS — I4891 Unspecified atrial fibrillation: Secondary | ICD-10-CM | POA: Diagnosis present

## 2014-07-01 DIAGNOSIS — I340 Nonrheumatic mitral (valve) insufficiency: Secondary | ICD-10-CM | POA: Diagnosis not present

## 2014-07-01 DIAGNOSIS — E785 Hyperlipidemia, unspecified: Secondary | ICD-10-CM | POA: Diagnosis not present

## 2014-07-01 DIAGNOSIS — I248 Other forms of acute ischemic heart disease: Secondary | ICD-10-CM | POA: Diagnosis present

## 2014-07-01 DIAGNOSIS — I639 Cerebral infarction, unspecified: Principal | ICD-10-CM

## 2014-07-01 DIAGNOSIS — I6339 Cerebral infarction due to thrombosis of other cerebral artery: Secondary | ICD-10-CM | POA: Diagnosis present

## 2014-07-01 DIAGNOSIS — Z86718 Personal history of other venous thrombosis and embolism: Secondary | ICD-10-CM | POA: Diagnosis not present

## 2014-07-01 DIAGNOSIS — I6523 Occlusion and stenosis of bilateral carotid arteries: Secondary | ICD-10-CM | POA: Diagnosis not present

## 2014-07-01 DIAGNOSIS — R001 Bradycardia, unspecified: Secondary | ICD-10-CM | POA: Diagnosis present

## 2014-07-01 DIAGNOSIS — R471 Dysarthria and anarthria: Secondary | ICD-10-CM | POA: Diagnosis present

## 2014-07-01 DIAGNOSIS — M160 Bilateral primary osteoarthritis of hip: Secondary | ICD-10-CM | POA: Diagnosis not present

## 2014-07-01 DIAGNOSIS — Z7902 Long term (current) use of antithrombotics/antiplatelets: Secondary | ICD-10-CM

## 2014-07-01 DIAGNOSIS — Y92002 Bathroom of unspecified non-institutional (private) residence single-family (private) house as the place of occurrence of the external cause: Secondary | ICD-10-CM | POA: Diagnosis not present

## 2014-07-01 DIAGNOSIS — I10 Essential (primary) hypertension: Secondary | ICD-10-CM | POA: Diagnosis not present

## 2014-07-01 DIAGNOSIS — I638 Other cerebral infarction: Secondary | ICD-10-CM | POA: Diagnosis not present

## 2014-07-01 DIAGNOSIS — W19XXXA Unspecified fall, initial encounter: Secondary | ICD-10-CM

## 2014-07-01 DIAGNOSIS — E119 Type 2 diabetes mellitus without complications: Secondary | ICD-10-CM | POA: Diagnosis not present

## 2014-07-01 DIAGNOSIS — R300 Dysuria: Secondary | ICD-10-CM | POA: Diagnosis present

## 2014-07-01 DIAGNOSIS — I129 Hypertensive chronic kidney disease with stage 1 through stage 4 chronic kidney disease, or unspecified chronic kidney disease: Secondary | ICD-10-CM | POA: Diagnosis not present

## 2014-07-01 DIAGNOSIS — Z8673 Personal history of transient ischemic attack (TIA), and cerebral infarction without residual deficits: Secondary | ICD-10-CM | POA: Diagnosis not present

## 2014-07-01 DIAGNOSIS — I739 Peripheral vascular disease, unspecified: Secondary | ICD-10-CM | POA: Diagnosis not present

## 2014-07-01 DIAGNOSIS — M25552 Pain in left hip: Secondary | ICD-10-CM | POA: Diagnosis not present

## 2014-07-01 DIAGNOSIS — I63312 Cerebral infarction due to thrombosis of left middle cerebral artery: Secondary | ICD-10-CM | POA: Diagnosis not present

## 2014-07-01 DIAGNOSIS — R4189 Other symptoms and signs involving cognitive functions and awareness: Secondary | ICD-10-CM | POA: Diagnosis not present

## 2014-07-01 DIAGNOSIS — K5900 Constipation, unspecified: Secondary | ICD-10-CM | POA: Diagnosis present

## 2014-07-01 DIAGNOSIS — R42 Dizziness and giddiness: Secondary | ICD-10-CM | POA: Diagnosis not present

## 2014-07-01 DIAGNOSIS — H9190 Unspecified hearing loss, unspecified ear: Secondary | ICD-10-CM | POA: Diagnosis not present

## 2014-07-01 DIAGNOSIS — M1612 Unilateral primary osteoarthritis, left hip: Secondary | ICD-10-CM | POA: Diagnosis not present

## 2014-07-01 DIAGNOSIS — I69354 Hemiplegia and hemiparesis following cerebral infarction affecting left non-dominant side: Secondary | ICD-10-CM | POA: Diagnosis not present

## 2014-07-01 DIAGNOSIS — R200 Anesthesia of skin: Secondary | ICD-10-CM | POA: Diagnosis not present

## 2014-07-01 DIAGNOSIS — W1830XA Fall on same level, unspecified, initial encounter: Secondary | ICD-10-CM | POA: Diagnosis present

## 2014-07-01 DIAGNOSIS — I6330 Cerebral infarction due to thrombosis of unspecified cerebral artery: Secondary | ICD-10-CM

## 2014-07-01 DIAGNOSIS — N189 Chronic kidney disease, unspecified: Secondary | ICD-10-CM | POA: Diagnosis not present

## 2014-07-01 DIAGNOSIS — G8190 Hemiplegia, unspecified affecting unspecified side: Secondary | ICD-10-CM | POA: Diagnosis not present

## 2014-07-01 DIAGNOSIS — I63311 Cerebral infarction due to thrombosis of right middle cerebral artery: Secondary | ICD-10-CM | POA: Diagnosis not present

## 2014-07-01 DIAGNOSIS — M25559 Pain in unspecified hip: Secondary | ICD-10-CM | POA: Diagnosis present

## 2014-07-01 DIAGNOSIS — K219 Gastro-esophageal reflux disease without esophagitis: Secondary | ICD-10-CM | POA: Diagnosis not present

## 2014-07-01 HISTORY — DX: Cerebral infarction, unspecified: I63.9

## 2014-07-01 HISTORY — DX: Essential (primary) hypertension: I10

## 2014-07-01 LAB — TROPONIN I
TROPONIN I: 0.53 ng/mL — AB (ref <0.031)
Troponin I: 0.54 ng/mL (ref <0.031)

## 2014-07-01 LAB — URINALYSIS, ROUTINE W REFLEX MICROSCOPIC
Bilirubin Urine: NEGATIVE
Glucose, UA: NEGATIVE mg/dL
HGB URINE DIPSTICK: NEGATIVE
Ketones, ur: NEGATIVE mg/dL
Nitrite: NEGATIVE
PROTEIN: NEGATIVE mg/dL
Specific Gravity, Urine: 1.015 (ref 1.005–1.030)
UROBILINOGEN UA: 0.2 mg/dL (ref 0.0–1.0)
pH: 7.5 (ref 5.0–8.0)

## 2014-07-01 LAB — APTT: aPTT: 31 seconds (ref 24–37)

## 2014-07-01 LAB — CBC
HCT: 39.7 % (ref 36.0–46.0)
Hemoglobin: 13.1 g/dL (ref 12.0–15.0)
MCH: 30 pg (ref 26.0–34.0)
MCHC: 33 g/dL (ref 30.0–36.0)
MCV: 90.8 fL (ref 78.0–100.0)
Platelets: 346 10*3/uL (ref 150–400)
RBC: 4.37 MIL/uL (ref 3.87–5.11)
RDW: 14.4 % (ref 11.5–15.5)
WBC: 6.7 10*3/uL (ref 4.0–10.5)

## 2014-07-01 LAB — COMPREHENSIVE METABOLIC PANEL WITH GFR
ALT: 13 U/L (ref 0–35)
AST: 36 U/L (ref 0–37)
Albumin: 3.8 g/dL (ref 3.5–5.2)
Alkaline Phosphatase: 28 U/L — ABNORMAL LOW (ref 39–117)
Anion gap: 10 (ref 5–15)
BUN: 28 mg/dL — ABNORMAL HIGH (ref 6–23)
CO2: 23 mmol/L (ref 19–32)
Calcium: 10.2 mg/dL (ref 8.4–10.5)
Chloride: 109 mmol/L (ref 96–112)
Creatinine, Ser: 1.31 mg/dL — ABNORMAL HIGH (ref 0.50–1.10)
GFR calc Af Amer: 42 mL/min — ABNORMAL LOW
GFR calc non Af Amer: 36 mL/min — ABNORMAL LOW
Glucose, Bld: 107 mg/dL — ABNORMAL HIGH (ref 70–99)
Potassium: 4.6 mmol/L (ref 3.5–5.1)
Sodium: 142 mmol/L (ref 135–145)
Total Bilirubin: 1 mg/dL (ref 0.3–1.2)
Total Protein: 6.7 g/dL (ref 6.0–8.3)

## 2014-07-01 LAB — DIFFERENTIAL
Basophils Absolute: 0 K/uL (ref 0.0–0.1)
Basophils Relative: 0 % (ref 0–1)
Eosinophils Absolute: 0.1 K/uL (ref 0.0–0.7)
Eosinophils Relative: 1 % (ref 0–5)
Lymphocytes Relative: 19 % (ref 12–46)
Lymphs Abs: 1.3 K/uL (ref 0.7–4.0)
Monocytes Absolute: 0.6 K/uL (ref 0.1–1.0)
Monocytes Relative: 9 % (ref 3–12)
Neutro Abs: 4.7 K/uL (ref 1.7–7.7)
Neutrophils Relative %: 71 % (ref 43–77)

## 2014-07-01 LAB — URINE MICROSCOPIC-ADD ON

## 2014-07-01 LAB — I-STAT TROPONIN, ED: Troponin i, poc: 0.43 ng/mL (ref 0.00–0.08)

## 2014-07-01 LAB — CBG MONITORING, ED
Glucose-Capillary: 71 mg/dL (ref 70–99)
Glucose-Capillary: 75 mg/dL (ref 70–99)

## 2014-07-01 LAB — CREATININE, SERUM
Creatinine, Ser: 1.16 mg/dL — ABNORMAL HIGH (ref 0.50–1.10)
GFR calc non Af Amer: 42 mL/min — ABNORMAL LOW (ref >90)
GFR, EST AFRICAN AMERICAN: 48 mL/min — AB (ref >90)

## 2014-07-01 LAB — PROTIME-INR
INR: 1.18 (ref 0.00–1.49)
Prothrombin Time: 15.1 s (ref 11.6–15.2)

## 2014-07-01 MED ORDER — STROKE: EARLY STAGES OF RECOVERY BOOK
Freq: Once | Status: AC
  Administered 2014-07-01: 22:00:00

## 2014-07-01 MED ORDER — LORAZEPAM 2 MG/ML IJ SOLN
0.5000 mg | Freq: Once | INTRAMUSCULAR | Status: AC
  Administered 2014-07-01: 0.5 mg via INTRAVENOUS
  Filled 2014-07-01: qty 1

## 2014-07-01 MED ORDER — ASPIRIN EC 81 MG PO TBEC
81.0000 mg | DELAYED_RELEASE_TABLET | Freq: Every day | ORAL | Status: DC
  Administered 2014-07-01 – 2014-07-04 (×4): 81 mg via ORAL
  Filled 2014-07-01 (×4): qty 1

## 2014-07-01 MED ORDER — SENNOSIDES-DOCUSATE SODIUM 8.6-50 MG PO TABS: 1.0000 | ORAL_TABLET | Freq: Every evening | ORAL | Status: DC | PRN

## 2014-07-01 MED ORDER — SODIUM CHLORIDE 0.9 % IV SOLN
INTRAVENOUS | Status: AC
  Administered 2014-07-01: 13:00:00 via INTRAVENOUS

## 2014-07-01 MED ORDER — HYDROCODONE-ACETAMINOPHEN 5-325 MG PO TABS: 1.0000 | ORAL_TABLET | Freq: Four times a day (QID) | ORAL | Status: DC | PRN

## 2014-07-01 MED ORDER — HEPARIN SODIUM (PORCINE) 5000 UNIT/ML IJ SOLN
5000.0000 [IU] | Freq: Three times a day (TID) | INTRAMUSCULAR | Status: DC
  Administered 2014-07-01 – 2014-07-03 (×5): 5000 [IU] via SUBCUTANEOUS
  Filled 2014-07-01 (×5): qty 1

## 2014-07-01 MED ORDER — LORAZEPAM 2 MG/ML IJ SOLN: 0.5000 mg | Freq: Once | INTRAMUSCULAR | Status: DC

## 2014-07-01 MED ORDER — PENTOXIFYLLINE ER 400 MG PO TBCR
400.0000 mg | EXTENDED_RELEASE_TABLET | Freq: Every day | ORAL | Status: DC
  Administered 2014-07-02 – 2014-07-04 (×3): 400 mg via ORAL
  Filled 2014-07-01 (×4): qty 1

## 2014-07-01 MED ORDER — SODIUM CHLORIDE 0.9 % IV SOLN
INTRAVENOUS | Status: AC
  Administered 2014-07-01: 15:00:00 via INTRAVENOUS

## 2014-07-01 MED ORDER — LATANOPROST 0.005 % OP SOLN
1.0000 [drp] | Freq: Every day | OPHTHALMIC | Status: DC
  Administered 2014-07-01 – 2014-07-03 (×3): 1 [drp] via OPHTHALMIC
  Filled 2014-07-01: qty 2.5

## 2014-07-01 MED ORDER — CLOPIDOGREL BISULFATE 75 MG PO TABS
75.0000 mg | ORAL_TABLET | Freq: Every day | ORAL | Status: DC
  Administered 2014-07-01 – 2014-07-04 (×4): 75 mg via ORAL
  Filled 2014-07-01 (×4): qty 1

## 2014-07-01 MED ORDER — FENOFIBRATE 160 MG PO TABS
160.0000 mg | ORAL_TABLET | Freq: Every day | ORAL | Status: DC
  Administered 2014-07-02 – 2014-07-04 (×3): 160 mg via ORAL
  Filled 2014-07-01 (×4): qty 1

## 2014-07-01 MED ORDER — HYDRALAZINE HCL 20 MG/ML IJ SOLN
10.0000 mg | Freq: Four times a day (QID) | INTRAMUSCULAR | Status: DC | PRN
  Administered 2014-07-03 – 2014-07-04 (×2): 10 mg via INTRAVENOUS
  Filled 2014-07-01 (×2): qty 1

## 2014-07-01 MED ORDER — POLYETHYLENE GLYCOL 3350 17 G PO PACK
17.0000 g | PACK | Freq: Every day | ORAL | Status: DC
  Administered 2014-07-02 – 2014-07-04 (×3): 17 g via ORAL
  Filled 2014-07-01 (×3): qty 1

## 2014-07-01 NOTE — Evaluation (Signed)
Clinical/Bedside Swallow Evaluation Patient Details  Name: Tracy FinerOphelia W Troyer MRN: [id number] Date of Birth: 12/02/1926  Today's Date: 07/01/2014 Time: SLP Start Time (ACUTE ONLY): 1518 SLP Stop Time (ACUTE ONLY): 1537 SLP Time Calculation (min) (ACUTE ONLY): 19 min  Past Medical History:  Past Medical History  Diagnosis Date  . Stroke   . Hypertension    Past Surgical History: No past surgical history on file. HPI:  81 y.o. female with a history hypertension who reports left sided numbness and difficulty with gait. Head CT shows chronic bilateral occipital infarcts and a left cerebellar infarct. No acute changes were noted.MRI pending.   Assessment / Plan / Recommendation Clinical Impression  Pt has mild left-sided buccal pocketing due to decreased sensation. With Min cues for emergent awareness of pocketing, pt was able to clear residuals with use of a lingual sweep and liquid wash. Recommend regular textures and thin liquids with brief SLP f/u for tolerance.    Aspiration Risk  Mild    Diet Recommendation Regular;Thin liquid   Liquid Administration via: Cup;Straw Medication Administration: Whole meds with liquid Supervision: Patient able to self feed;Intermittent supervision to cue for compensatory strategies Compensations: Slow rate;Small sips/bites;Check for pocketing Postural Changes and/or Swallow Maneuvers: Seated upright 90 degrees;Upright 30-60 min after meal    Other  Recommendations Oral Care Recommendations: Oral care BID   Follow Up Recommendations   (tba)    Frequency and Duration min 1 x/week  1 week   Pertinent Vitals/Pain n/a    SLP Swallow Goals     Swallow Study Prior Functional Status       General Date of Onset: 06/30/14 HPI: 81 y.o. female with a history hypertension who reports left sided numbness and difficulty with gait. Head CT shows chronic bilateral occipital infarcts and a left cerebellar infarct. No acute changes were noted.MRI  pending. Type of Study: Bedside swallow evaluation Previous Swallow Assessment: none in chart Diet Prior to this Study: NPO Temperature Spikes Noted: No Respiratory Status: Room air History of Recent Intubation: No Behavior/Cognition: Alert;Cooperative;Pleasant mood Oral Cavity - Dentition: Dentures, top;Dentures, bottom Self-Feeding Abilities: Able to feed self;Needs assist (mild difficulties problem solving with use of left hand) Patient Positioning: Upright in bed Baseline Vocal Quality: Clear Volitional Cough: Strong Volitional Swallow: Able to elicit    Oral/Motor/Sensory Function Overall Oral Motor/Sensory Function: Impaired Labial ROM: Within Functional Limits Labial Symmetry: Within Functional Limits Labial Strength: Reduced Labial Sensation: Reduced Lingual ROM: Within Functional Limits Lingual Symmetry: Within Functional Limits Lingual Strength: Within Functional Limits Lingual Sensation: Within Functional Limits Facial ROM: Within Functional Limits Facial Symmetry: Within Functional Limits Facial Strength: Within Functional Limits Facial Sensation: Reduced Velum: Within Functional Limits Mandible: Within Functional Limits   Ice Chips Ice chips: Not tested   Thin Liquid Thin Liquid: Within functional limits Presentation: Cup;Self Fed;Straw    Nectar Thick Nectar Thick Liquid: Not tested   Honey Thick Honey Thick Liquid: Not tested   Puree Puree: Within functional limits Presentation: Self Fed;Spoon   Solid    Solid: Impaired Presentation: Self Fed Oral Phase Impairments: Poor awareness of bolus Oral Phase Functional Implications: Left lateral sulci pocketing      Maxcine HamLaura Paiewonsky, M.A. CCC-SLP [phone]  Maxcine Hamaiewonsky, Deetta Siegmann 07/01/2014,3:55 PM

## 2014-07-01 NOTE — H&P (Signed)
[facility] History and Physical                                                                                    Tracy [name], is a 81 y.o. female  MRN: [id number]   DOB - September 06, 1926  Admit Date - 07/01/2014  Outpatient Primary MD for the patient is No primary care provider on file.  With History of -  Past Medical History  Diagnosis Date  . Stroke   . Hypertension       No past surgical history on file.  in for   Chief Complaint  Patient presents with  . Numbness     HPI Patient has 2 medical record numbers. Her other medical record number is [id number]   Tracy [name]  is a 81 y.o. female, with a past medical history of hypertension and stroke. She presented to the ER with left-sided weakness and numbness. She was last seen normal last night at about 6 PM when she began to develop left leg numbness and weakness. This progressed and eventually included her left arm and her left upper lip. She states that she became weak and fell in the bathroom last night bruising her right knee.  Her husband insisted on bringing her to the ER this morning when her symptoms had not improved. In the emergency department she was found to have an elevated troponin of 0.54, she is bradycardic between 45 and 52, her creatinine is mildly elevated at 1.3, and she did demonstrate left-sided numbness on exam.  Her initial CT was negative for acute stroke. She was seen by neurology who requested that she be  fo admittedr a full stroke workup.  Review of Systems   In addition to the HPI above,  No Fever-chills, No Headache, No changes with Vision or hearing, No problems swallowing food or Liquids, No Chest pain, Cough or Shortness of Breath, No Abdominal pain, No Nausea or Vomiting No Blood in stool or Urine, Positive foul-smelling urine.  Positive for constipation. No recent weight gain or loss, A full 10 point Review of Systems was done, except as stated above, all other Review of Systems  were negative.  Social History History  Substance Use Topics  . Smoking status: Never Smoker   . Smokeless tobacco: Not on file  . Alcohol Use: No    Family History She reports no history of stroke in her family. In   Prior to Admission medications   Medication Sig Start Date End Date Taking? Authorizing Provider  amLODipine-benazepril (LOTREL) 10-40 MG per capsule Take 1 capsule by mouth daily.   Yes Historical Provider, MD  aspirin EC 81 MG tablet Take 81 mg by mouth daily.   Yes Historical Provider, MD  clopidogrel (PLAVIX) 75 MG tablet Take 75 mg by mouth daily.   Yes Historical Provider, MD  fenofibrate micronized (LOFIBRA) 134 MG capsule Take 134 mg by mouth daily.   Yes Historical Provider, MD  HYDROcodone-acetaminophen (NORCO/VICODIN) 5-325 MG per tablet Take 1 tablet by mouth every 6 (six) hours as needed for moderate pain.   Yes Historical Provider, MD  latanoprost (XALATAN) 0.005 % ophthalmic solution Place 1 drop into  both eyes daily. 06/13/14  Yes Historical Provider, MD  pentoxifylline (TRENTAL) 400 MG CR tablet Take 400 mg by mouth daily.   Yes Historical Provider, MD    No Known Allergies  Physical Exam  Vitals  Filed Vitals:   07/01/14 1300 07/01/14 1315 07/01/14 1418 07/01/14 1629  BP: 149/86 160/64 156/80 184/75  Pulse: 55 41 51 100  Temp:   98.2 F (36.8 C)   TempSrc:   Oral   Resp: 24 20 18 20   Height:      Weight:      SpO2: 96% 97% 100% 100%     General: thin, frail, elderly female lying in bed in NAD,  surrounded by family  Psych:  mildly confused, her husband keeps correcting her history  Neuro:   Demonstrates left-sided lip numbness and decreased sensation in her left lower extremity below her knee. Strength is symmetric.   ENT:  Ears and Eyes appear Normal, Conjunctivae clear, PER. Moist oral mucosa without erythema or exudates.  Neck:  Supple, No lymphadenopathy appreciated  Respiratory:  Symmetrical chest wall movement, Good air  movement bilaterally, CTAB.  Cardiac:  bradycardic, No Murmurs, no LE edema noted, no JVD.    Abdomen:  Positive bowel sounds, Soft, Non tender, Non distended,  No masses appreciated  Skin:  No Cyanosis, Normal Skin Turgor, No Skin Rash or Bruise.  Extremities:  Able to move all 4. 5/5 strength in each,  no effusions.  Data Review  CBC  Recent Labs Lab 07/01/14 1020  WBC 6.7  HGB 13.1  HCT 39.7  PLT 346  MCV 90.8  MCH 30.0  MCHC 33.0  RDW 14.4  LYMPHSABS 1.3  MONOABS 0.6  EOSABS 0.1  BASOSABS 0.0    Chemistries   Recent Labs Lab 07/01/14 1020  NA 142  K 4.6  CL 109  CO2 23  GLUCOSE 107*  BUN 28*  CREATININE 1.31*  CALCIUM 10.2  AST 36  ALT 13  ALKPHOS 28*  BILITOT 1.0     Coagulation profile  Recent Labs Lab 07/01/14 1020  INR 1.18     Cardiac Enzymes  Recent Labs Lab 07/01/14 1020  TROPONINI 0.54*     Urinalysis    Component Value Date/Time   COLORURINE YELLOW 07/01/2014 1510   APPEARANCEUR CLOUDY* 07/01/2014 1510   LABSPEC 1.015 07/01/2014 1510   PHURINE 7.5 07/01/2014 1510   GLUCOSEU NEGATIVE 07/01/2014 1510   HGBUR NEGATIVE 07/01/2014 1510   BILIRUBINUR NEGATIVE 07/01/2014 1510   KETONESUR NEGATIVE 07/01/2014 1510   PROTEINUR NEGATIVE 07/01/2014 1510   UROBILINOGEN 0.2 07/01/2014 1510   NITRITE NEGATIVE 07/01/2014 1510   LEUKOCYTESUR TRACE* 07/01/2014 1510    Imaging results:   Ct Head (brain) Wo Contrast  07/01/2014   CLINICAL DATA:  Left-sided numbness and weakness since yesterday.  EXAM: CT HEAD WITHOUT CONTRAST  TECHNIQUE: Contiguous axial images were obtained from the base of the skull through the vertex without intravenous contrast.  COMPARISON:  None.  FINDINGS: There is no evidence of mass effect, midline shift, or extra-axial fluid collections. There is no evidence of a space-occupying lesion or intracranial hemorrhage. There is no evidence of a cortical-based area of acute infarction. There are bilateral old  parieto-occipital lobe infarcts. There is an old left cerebellar infarct. There is a small left lacunar sub insular infarct. There is generalized cerebral atrophy. There is periventricular white matter low attenuation likely secondary to microangiopathy.  The ventricles and sulci are appropriate for the patient's age. The  basal cisterns are patent.  Visualized portions of the orbits are unremarkable. The visualized portions of the paranasal sinuses and mastoid air cells are unremarkable. Cerebrovascular atherosclerotic calcifications are noted.  The osseous structures are unremarkable.  IMPRESSION: 1. No acute intracranial pathology. 2. Chronic microvascular disease and cerebral atrophy. 3. Bilateral old parieto-occipital lobe infarcts and left cerebellar infarct.   Electronically Signed   By: Elige Ko   On: 07/01/2014 11:11   Dg Chest Port 1 View  07/01/2014   CLINICAL DATA:  Bradycardia  EXAM: PORTABLE CHEST - 1 VIEW  COMPARISON:  None.  FINDINGS: There are scattered calcified granulomas throughout the lungs bilaterally. There is no edema or consolidation. Heart is mildly enlarged with pulmonary vascularity within normal limits. No adenopathy. There is atherosclerotic change in aorta. There is sclerosis in the proximal right humerus.  IMPRESSION: Multiple granulomas. No edema or consolidation. Mild cardiomegaly. Incomplete visualization of sclerosis in the right proximal humerus. This area may represent bone infarct. It may be prudent to consider dedicated right humerus radiographs to further evaluate this region.   Electronically Signed   By: Bretta Bang III M.D.   On: 07/01/2014 12:13    My personal review of EKG: Repeat EKG pending   Assessment & Plan  Principal Problem:   CVA (cerebral infarction) Active Problems:   Bradycardia   Elevated troponin   Dysuria   Constipation   Stroke  Stroke  Need to rule out acute CVA. Neurology is on board. Patient was admitted under the stroke  order set and thus will have MRI, MRA, carotid Dopplers, 2-D echo. Patient is already on aspirin, Plavix, Trental.  Elevated  troponin  We will cycle troponins. EKG will appear to have a lot of artifact. We will repeat EKG. The patient does have a previous EKG in her other chart from 2013.  Gretchen, [place]  MRN [id number]  Bradycardia  Uncertain etiology. Not on beta blockers at home. But will hold calcium channel blocker.  Dysuria UA pending.  pls follow.  Elevated creatinine Baseline is 1.0. Will hold ACE inhibitor. Give gentle IV fluids Bmet ordered for 3/29.  DVT Prophylaxis: Heparin  AM Labs Ordered, also please review Full Orders  Family Communication:   Husband and daughters at bedside  Code Status:  Full code. Family to discuss (patient seemed to want DO NOT RESUSCITATE)  Condition:  Guarded  Time spent in minutes : [address],  PA-C on 07/01/2014 at 5:15 PM  Between 7am to 7pm - Pager - [phone]  After 7pm go to [email or web address] - password [email or web address]  And look for the night coverage person covering me after hours  [facility]

## 2014-07-01 NOTE — Progress Notes (Addendum)
Received from ER via stretcher; patient is alert and oriented; oriented patient to room and unit routine. Family at bedside.

## 2014-07-01 NOTE — ED Provider Notes (Signed)
CSN: [id number]     Arrival date & time 07/01/14  0945 History   First MD Initiated Contact with Patient 07/01/14 1008     Chief Complaint  Patient presents with  . Numbness     (Consider location/radiation/quality/duration/timing/severity/associated sxs/prior Treatment) The history is provided by the patient and a relative.   81 year old the female acute onset of numbness to left leg and left arm and left upper lip at around 6 PM last evening. Patient also noted last evening difficulty walking. Thought it was due to the numbness. Patient with a prior history of stroke and hypertension. Patient's primary care doctor is Oliver Barre. Patient denies headache denies any chest pain or shortness of breath any nausea or vomiting. No difficulty with speech.  Past Medical History  Diagnosis Date  . Stroke   . Hypertension    No past surgical history on file. No family history on file. History  Substance Use Topics  . Smoking status: Never Smoker   . Smokeless tobacco: Not on file  . Alcohol Use: No   OB History    No data available     Review of Systems  Constitutional: Negative for fever.  Eyes: Negative for visual disturbance.  Respiratory: Negative for shortness of breath.   Cardiovascular: Negative for chest pain.  Gastrointestinal: Negative for abdominal pain.  Genitourinary: Negative for dysuria.  Musculoskeletal: Negative for back pain and neck pain.  Skin: Negative for rash.  Neurological: Positive for weakness and numbness.  Hematological: Does not bruise/bleed easily.      Allergies  Review of patient's allergies indicates no known allergies.  Home Medications   Prior to Admission medications   Medication Sig Start Date End Date Taking? Authorizing Provider  amLODipine-benazepril (LOTREL) 10-40 MG per capsule Take 1 capsule by mouth daily.   Yes Historical Provider, MD  aspirin EC 81 MG tablet Take 81 mg by mouth daily.   Yes Historical Provider, MD  clopidogrel  (PLAVIX) 75 MG tablet Take 75 mg by mouth daily.   Yes Historical Provider, MD  fenofibrate micronized (LOFIBRA) 134 MG capsule Take 134 mg by mouth daily.   Yes Historical Provider, MD  HYDROcodone-acetaminophen (NORCO/VICODIN) 5-325 MG per tablet Take 1 tablet by mouth every 6 (six) hours as needed for moderate pain.   Yes Historical Provider, MD  latanoprost (XALATAN) 0.005 % ophthalmic solution Place 1 drop into both eyes daily. 06/13/14  Yes Historical Provider, MD  pentoxifylline (TRENTAL) 400 MG CR tablet Take 400 mg by mouth daily.   Yes Historical Provider, MD   BP 161/59 mmHg  Pulse 43  Temp(Src) 98 F (36.7 C) (Oral)  Resp 13  Ht  (1.499 m)  Wt 116 lb (52.617 kg)  BMI 23.42 kg/m2  SpO2 99% Physical Exam  Constitutional: She is oriented to person, place, and time. She appears well-developed and well-nourished. No distress.  HENT:  Head: Normocephalic and atraumatic.  Mouth/Throat: Oropharynx is clear and moist.  Eyes: Conjunctivae and EOM are normal. Pupils are equal, round, and reactive to light.  Neck: Normal range of motion.  Cardiovascular: Regular rhythm.   No murmur heard. Bradycardic  Pulmonary/Chest: Effort normal and breath sounds normal. No respiratory distress.  Abdominal: Soft. Bowel sounds are normal. There is no tenderness.  Musculoskeletal: Normal range of motion.  Neurological: She is alert and oriented to person, place, and time. No cranial nerve deficit.  Motor weakness to the left arm and left leg compared to right.  Skin: No rash noted.  Nursing note and vitals reviewed.   ED Course  Procedures (including critical care time) Labs Review Labs Reviewed  COMPREHENSIVE METABOLIC PANEL - Abnormal; Notable for the following:    Glucose, Bld 107 (*)    BUN 28 (*)    Creatinine, Ser 1.31 (*)    Alkaline Phosphatase 28 (*)    GFR calc non Af Amer 36 (*)    GFR calc Af Amer 42 (*)    All other components within normal limits  I-STAT TROPOININ, ED  - Abnormal; Notable for the following:    Troponin i, poc 0.43 (*)    All other components within normal limits  PROTIME-INR  APTT  CBC  DIFFERENTIAL  TROPONIN I  CBG MONITORING, ED   Results for orders placed or performed during the hospital encounter of 07/01/14  Protime-INR  Result Value Ref Range   Prothrombin Time 15.1 11.6 - 15.2 seconds   INR 1.18 0.00 - 1.49  APTT  Result Value Ref Range   aPTT 31 24 - 37 seconds  CBC  Result Value Ref Range   WBC 6.7 4.0 - 10.5 K/uL   RBC 4.37 3.87 - 5.11 MIL/uL   Hemoglobin 13.1 12.0 - 15.0 g/dL   HCT 78.239.7 95.636.0 - 21.346.0 %   MCV 90.8 78.0 - 100.0 fL   MCH 30.0 26.0 - 34.0 pg   MCHC 33.0 30.0 - 36.0 g/dL   RDW 08.614.4 57.811.5 - 46.915.5 %   Platelets 346 150 - 400 K/uL  Differential  Result Value Ref Range   Neutrophils Relative % 71 43 - 77 %   Neutro Abs 4.7 1.7 - 7.7 K/uL   Lymphocytes Relative 19 12 - 46 %   Lymphs Abs 1.3 0.7 - 4.0 K/uL   Monocytes Relative 9 3 - 12 %   Monocytes Absolute 0.6 0.1 - 1.0 K/uL   Eosinophils Relative 1 0 - 5 %   Eosinophils Absolute 0.1 0.0 - 0.7 K/uL   Basophils Relative 0 0 - 1 %   Basophils Absolute 0.0 0.0 - 0.1 K/uL  Comprehensive metabolic panel  Result Value Ref Range   Sodium 142 135 - 145 mmol/L   Potassium 4.6 3.5 - 5.1 mmol/L   Chloride 109 96 - 112 mmol/L   CO2 23 19 - 32 mmol/L   Glucose, Bld 107 (H) 70 - 99 mg/dL   BUN 28 (H) 6 - 23 mg/dL   Creatinine, Ser 6.291.31 (H) 0.50 - 1.10 mg/dL   Calcium 52.810.2 8.4 - 41.310.5 mg/dL   Total Protein 6.7 6.0 - 8.3 g/dL   Albumin 3.8 3.5 - 5.2 g/dL   AST 36 0 - 37 U/L   ALT 13 0 - 35 U/L   Alkaline Phosphatase 28 (L) 39 - 117 U/L   Total Bilirubin 1.0 0.3 - 1.2 mg/dL   GFR calc non Af Amer 36 (L) >90 mL/min   GFR calc Af Amer 42 (L) >90 mL/min   Anion gap 10 5 - 15  CBG monitoring, ED  Result Value Ref Range   Glucose-Capillary 71 70 - 99 mg/dL   Comment 1 Notify RN   [facility] troponin[facility])  Result Value Ref Range   Troponin i, poc  0.43 (HH) 0.00 - 0.08 ng/mL   Comment NOTIFIED PHYSICIAN    Comment 3             Imaging Review Ct Head (brain) Wo Contrast  07/01/2014   CLINICAL DATA:  Left-sided  numbness and weakness since yesterday.  EXAM: CT HEAD WITHOUT CONTRAST  TECHNIQUE: Contiguous axial images were obtained from the base of the skull through the vertex without intravenous contrast.  COMPARISON:  None.  FINDINGS: There is no evidence of mass effect, midline shift, or extra-axial fluid collections. There is no evidence of a space-occupying lesion or intracranial hemorrhage. There is no evidence of a cortical-based area of acute infarction. There are bilateral old parieto-occipital lobe infarcts. There is an old left cerebellar infarct. There is a small left lacunar sub insular infarct. There is generalized cerebral atrophy. There is periventricular white matter low attenuation likely secondary to microangiopathy.  The ventricles and sulci are appropriate for the patient's age. The basal cisterns are patent.  Visualized portions of the orbits are unremarkable. The visualized portions of the paranasal sinuses and mastoid air cells are unremarkable. Cerebrovascular atherosclerotic calcifications are noted.  The osseous structures are unremarkable.  IMPRESSION: 1. No acute intracranial pathology. 2. Chronic microvascular disease and cerebral atrophy. 3. Bilateral old parieto-occipital lobe infarcts and left cerebellar infarct.   Electronically Signed   By: Elige Ko   On: 07/01/2014 11:11   Dg Chest Port 1 View  07/01/2014   CLINICAL DATA:  Bradycardia  EXAM: PORTABLE CHEST - 1 VIEW  COMPARISON:  None.  FINDINGS: There are scattered calcified granulomas throughout the lungs bilaterally. There is no edema or consolidation. Heart is mildly enlarged with pulmonary vascularity within normal limits. No adenopathy. There is atherosclerotic change in aorta. There is sclerosis in the proximal right humerus.  IMPRESSION: Multiple  granulomas. No edema or consolidation. Mild cardiomegaly. Incomplete visualization of sclerosis in the right proximal humerus. This area may represent bone infarct. It may be prudent to consider dedicated right humerus radiographs to further evaluate this region.   Electronically Signed   By: Bretta Bang III M.D.   On: 07/01/2014 12:13     EKG Interpretation   Date/Time:  Monday July 01 2014 09:52:56 EDT Ventricular Rate:  54 PR Interval:  178 QRS Duration: 96 QT Interval:  416 QTC Calculation: 394 R Axis:   -2 Text Interpretation:  Sinus bradycardia with sinus arrhythmia Left  ventricular hypertrophy with repolarization abnormality Anteroseptal  infarct , age undetermined Abnormal ECG Artifact No previous ECGs  available Confirmed by Devell Parkerson  MD, Jyair Kiraly [phone]) on 07/01/2014 10:09:14  AM      MDM   Final diagnoses:  Cerebral infarction due to unspecified mechanism  CVA (cerebral infarction)    Patient clinically with evidence of left-sided CVA. Patient with left-sided weakness to arm and leg. Symptoms onset was last evening at 6 PM when she developed numbness in her left leg then her left hand and arm and then the left upper lip. Today noted that could not walk very well but also had some difficulty walking last night.  Patient on exam here noted to have left-sided weakness to the arm and leg as well. Concerning for stroke. Head CT negative for acute stroke. Discussed with neural hospitalist they will consult and have ranged internal medicine hospitalist admission.  Patient also had an elevated troponin of a 0.4 denies any chest pain or discomfort or pressure for the last 7 days. Patient's cardiac monitoring was a persistent sinus bradycardia. Patient's blood pressures were fine. Labs without any significant abnormality.    Vanetta Mulders, MD 07/01/14 1253

## 2014-07-01 NOTE — Consult Note (Signed)
Referring Physician: Dhungel    Chief Complaint: Left sided numbness  HPI: Tracy [name] is an 81 y.o. female with a history of a mini-stroke many years ago who presents with complaints of left sided numbness.  Patient reports that on yesterday noted numbness in her left leg.  Then noted numbness in the last two fingers of her left hand and into her arm.  Then whole hand eventually became numb.  Patient also has an area of numbness on the left side of her mouth.  Symptoms persisted overnight and this morning she was unable to ambulate without assistance.  Patient presented for follow up at that time.    Date last known well: Date: 06/30/2014 Time last known well: Time: 18:00 tPA Given: No: Outside time window  Past Medical History  Diagnosis Date  . Stroke   . Hypertension     No past surgical history on file.  Family history:  Both parents and all siblings (5) deceased.  Unclear the cause of any death other than mother.  Mother had colon cancer.  Social History:  reports that she has never smoked. She does not have any smokeless tobacco history on file. She reports that she does not drink alcohol or use illicit drugs.  Allergies: No Known Allergies  Medications:  I have reviewed the patient's current medications. Prior to Admission:  Prescriptions prior to admission  Medication Sig Dispense Refill Last Dose  . amLODipine-benazepril (LOTREL) 10-40 MG per capsule Take 1 capsule by mouth daily.   06/30/2014 at Unknown time  . aspirin EC 81 MG tablet Take 81 mg by mouth daily.   06/30/2014 at Unknown time  . clopidogrel (PLAVIX) 75 MG tablet Take 75 mg by mouth daily.   06/30/2014 at Unknown time  . fenofibrate micronized (LOFIBRA) 134 MG capsule Take 134 mg by mouth daily.   06/30/2014 at Unknown time  . HYDROcodone-acetaminophen (NORCO/VICODIN) 5-325 MG per tablet Take 1 tablet by mouth every 6 (six) hours as needed for moderate pain.   06/30/2014 at Unknown time  . latanoprost  (XALATAN) 0.005 % ophthalmic solution Place 1 drop into both eyes daily.   Past Week at Unknown time  . pentoxifylline (TRENTAL) 400 MG CR tablet Take 400 mg by mouth daily.   06/30/2014 at Unknown time   Scheduled: .  stroke: mapping our early stages of recovery book   Does not apply Once  . sodium chloride   Intravenous STAT  . sodium chloride   Intravenous STAT  . aspirin EC  81 mg Oral Daily  . clopidogrel  75 mg Oral Daily  . fenofibrate  160 mg Oral Daily  . heparin  5,000 Units Subcutaneous 3 times per day  . latanoprost  1 drop Both Eyes QHS  . LORazepam  0.5 mg Intravenous Once  . pentoxifylline  400 mg Oral Daily  . polyethylene glycol  17 g Oral Daily    ROS: History obtained from the patient  General ROS: negative for - chills, fatigue, fever, night sweats, weight gain or weight loss Psychological ROS: negative for - behavioral disorder, hallucinations, memory difficulties, mood swings or suicidal ideation Ophthalmic ROS: negative for - blurry vision, double vision, eye pain or loss of vision ENT ROS: negative for - epistaxis, nasal discharge, oral lesions, sore throat, tinnitus or vertigo Allergy and Immunology ROS: negative for - hives or itchy/watery eyes Hematological and Lymphatic ROS: negative for - bleeding problems, bruising or swollen lymph nodes Endocrine ROS: negative for - galactorrhea,  hair pattern changes, polydipsia/polyuria or temperature intolerance Respiratory ROS: negative for - cough, hemoptysis, shortness of breath or wheezing Cardiovascular ROS: negative for - chest pain, dyspnea on exertion, edema or irregular heartbeat Gastrointestinal ROS: negative for - abdominal pain, diarrhea, hematemesis, nausea/vomiting or stool incontinence Genito-Urinary ROS: negative for - dysuria, hematuria, incontinence or urinary frequency/urgency Musculoskeletal ROS: negative for - joint swelling or muscular weakness Neurological ROS: as noted in HPI Dermatological ROS:  negative for rash and skin lesion changes  Physical Examination: Blood pressure 156/80, pulse 51, temperature 98.2 F (36.8 C), temperature source Oral, resp. rate 18, height 4\' 11"  (1.499 m), weight 52.617 kg (116 lb), SpO2 100 %.  HEENT-  Normocephalic, no lesions, without obvious abnormality.  Normal external eye and conjunctiva.  Normal TM's bilaterally.  Normal auditory canals and external ears. Normal external nose, mucus membranes and septum.  Normal pharynx. Cardiovascular- S1, S2 normal, pulses palpable throughout   Lungs- chest clear, no wheezing, rales, normal symmetric air entry Abdomen- soft, non-tender; bowel sounds normal; no masses,  no organomegaly Extremities- no edema Lymph-no adenopathy palpable Musculoskeletal-no joint tenderness, deformity or swelling Skin-warm and dry, no hyperpigmentation, vitiligo, or suspicious lesions  Neurological Examination Mental Status: Alert, oriented, thought content appropriate.  Speech fluent without evidence of aphasia.  Able to follow 3 step commands without difficulty. Cranial Nerves: II: Discs flat bilaterally; Visual fields grossly normal, pupils equal, round, reactive to light and accommodation III,IV, VI: ptosis not present, extra-ocular motions intact bilaterally V,VII: smile symmetric, facial light touch sensation decreased around the mouth on the left VIII: hearing normal bilaterally IX,X: gag reflex present XI: bilateral shoulder shrug XII: midline tongue extension Motor: Right : Upper extremity   5/5    Left:     Upper extremity   5-/5 with drift  Lower extremity   5/5     Lower extremity   5/5 Tone and bulk:normal tone throughout; no atrophy noted Sensory: Pinprick and light touch decreased on the left Deep Tendon Reflexes: 2+ and symmetric throughout Plantars: Right: downgoing   Left: downgoing Cerebellar: Dysmetria with finger-to-nose on the left and normal heel-to-shin testing bilaterally Gait: not tested due to  safety concerns    Laboratory Studies:  Basic Metabolic Panel:  Recent Labs Lab 07/01/14 1020  NA 142  K 4.6  CL 109  CO2 23  GLUCOSE 107*  BUN 28*  CREATININE 1.31*  CALCIUM 10.2    Liver Function Tests:  Recent Labs Lab 07/01/14 1020  AST 36  ALT 13  ALKPHOS 28*  BILITOT 1.0  PROT 6.7  ALBUMIN 3.8   No results for input(s): LIPASE, AMYLASE in the last 168 hours. No results for input(s): AMMONIA in the last 168 hours.  CBC:  Recent Labs Lab 07/01/14 1020  WBC 6.7  NEUTROABS 4.7  HGB 13.1  HCT 39.7  MCV 90.8  PLT 346    Cardiac Enzymes:  Recent Labs Lab 07/01/14 1020  TROPONINI 0.54*    BNP: Invalid input(s): POCBNP  CBG:  Recent Labs Lab 07/01/14 1203 07/01/14 1322  GLUCAP 71 75    Microbiology: No results found for this or any previous visit.  Coagulation Studies:  Recent Labs  07/01/14 1020  LABPROT 15.1  INR 1.18    Urinalysis: No results for input(s): COLORURINE, LABSPEC, PHURINE, GLUCOSEU, HGBUR, BILIRUBINUR, KETONESUR, PROTEINUR, UROBILINOGEN, NITRITE, LEUKOCYTESUR in the last 168 hours.  Invalid input(s): APPERANCEUR  Lipid Panel: No results found for: CHOL, TRIG, HDL, CHOLHDL, VLDL, LDLCALC  HgbA1C: No results  found for: HGBA1C  Urine Drug Screen:  No results found for: LABOPIA, COCAINSCRNUR, LABBENZ, AMPHETMU, THCU, LABBARB  Alcohol Level: No results for input(s): ETH in the last 168 hours.  Other results: EKG: sinus bradycardia at 54 bpm.  Imaging: Ct Head (brain) Wo Contrast  07/01/2014   CLINICAL DATA:  Left-sided numbness and weakness since yesterday.  EXAM: CT HEAD WITHOUT CONTRAST  TECHNIQUE: Contiguous axial images were obtained from the base of the skull through the vertex without intravenous contrast.  COMPARISON:  None.  FINDINGS: There is no evidence of mass effect, midline shift, or extra-axial fluid collections. There is no evidence of a space-occupying lesion or intracranial hemorrhage. There is  no evidence of a cortical-based area of acute infarction. There are bilateral old parieto-occipital lobe infarcts. There is an old left cerebellar infarct. There is a small left lacunar sub insular infarct. There is generalized cerebral atrophy. There is periventricular white matter low attenuation likely secondary to microangiopathy.  The ventricles and sulci are appropriate for the patient's age. The basal cisterns are patent.  Visualized portions of the orbits are unremarkable. The visualized portions of the paranasal sinuses and mastoid air cells are unremarkable. Cerebrovascular atherosclerotic calcifications are noted.  The osseous structures are unremarkable.  IMPRESSION: 1. No acute intracranial pathology. 2. Chronic microvascular disease and cerebral atrophy. 3. Bilateral old parieto-occipital lobe infarcts and left cerebellar infarct.   Electronically Signed   By: Elige Ko   On: 07/01/2014 11:11   Dg Chest Port 1 View  07/01/2014   CLINICAL DATA:  Bradycardia  EXAM: PORTABLE CHEST - 1 VIEW  COMPARISON:  None.  FINDINGS: There are scattered calcified granulomas throughout the lungs bilaterally. There is no edema or consolidation. Heart is mildly enlarged with pulmonary vascularity within normal limits. No adenopathy. There is atherosclerotic change in aorta. There is sclerosis in the proximal right humerus.  IMPRESSION: Multiple granulomas. No edema or consolidation. Mild cardiomegaly. Incomplete visualization of sclerosis in the right proximal humerus. This area may represent bone infarct. It may be prudent to consider dedicated right humerus radiographs to further evaluate this region.   Electronically Signed   By: Bretta Bang III M.D.   On: 07/01/2014 12:13    Assessment: 81 y.o. female with a history hypertension who reports left sided numbness since yesterday and difficulty with gait today.  Head CT personally reviewed and shows chronic bilateral occipital infarcts and a left cerebellar  infarct.  No acute changes were noted.  Concern still remains for an acute infarct.  Further work up recommended.  Patient on ASA and Plavix at home.    Stroke Risk Factors - hypertension  Plan: 1. HgbA1c, fasting lipid panel 2. MRI, MRA  of the brain without contrast 3. PT consult, OT consult, Speech consult 4. Echocardiogram 5. Carotid dopplers 6. Prophylactic therapy-continue ASA and Plavix 7. NPO until RN stroke swallow screen 8. Telemetry monitoring 9. Frequent neuro checks  Thana Farr, MD [facility] [phone] 07/01/2014, 2:40 PM

## 2014-07-01 NOTE — ED Notes (Signed)
MD Zackowsk notified of patient's CBG recommendation to monitor advised will recheck in one hour.

## 2014-07-01 NOTE — ED Notes (Signed)
Report recived from Tobi Bastosnna RN states no swallow screen has been completed. Advised did not know CBG 70.

## 2014-07-01 NOTE — ED Notes (Signed)
Pt reports onset last PM of left weakness and "going to sleep" feeling in left foot and arm and face. Sts she attempted to stand up and she felt weak and couldn't walk, now feels tingling also,

## 2014-07-01 NOTE — ED Notes (Signed)
Notified Dr Deretha EmoryZackowski of critical i stat result

## 2014-07-02 ENCOUNTER — Inpatient Hospital Stay (HOSPITAL_COMMUNITY): Payer: Medicare Other

## 2014-07-02 ENCOUNTER — Encounter (HOSPITAL_COMMUNITY): Payer: Self-pay | Admitting: *Deleted

## 2014-07-02 DIAGNOSIS — M25559 Pain in unspecified hip: Secondary | ICD-10-CM | POA: Diagnosis present

## 2014-07-02 DIAGNOSIS — M25552 Pain in left hip: Secondary | ICD-10-CM

## 2014-07-02 DIAGNOSIS — I63311 Cerebral infarction due to thrombosis of right middle cerebral artery: Secondary | ICD-10-CM

## 2014-07-02 DIAGNOSIS — I10 Essential (primary) hypertension: Secondary | ICD-10-CM

## 2014-07-02 DIAGNOSIS — R42 Dizziness and giddiness: Secondary | ICD-10-CM

## 2014-07-02 DIAGNOSIS — I340 Nonrheumatic mitral (valve) insufficiency: Secondary | ICD-10-CM

## 2014-07-02 LAB — URINE CULTURE

## 2014-07-02 LAB — BASIC METABOLIC PANEL
Anion gap: 6 (ref 5–15)
BUN: 20 mg/dL (ref 6–23)
CALCIUM: 9.3 mg/dL (ref 8.4–10.5)
CO2: 26 mmol/L (ref 19–32)
Chloride: 107 mmol/L (ref 96–112)
Creatinine, Ser: 1.15 mg/dL — ABNORMAL HIGH (ref 0.50–1.10)
GFR calc Af Amer: 49 mL/min — ABNORMAL LOW (ref >90)
GFR calc non Af Amer: 42 mL/min — ABNORMAL LOW (ref >90)
Glucose, Bld: 80 mg/dL (ref 70–99)
Potassium: 3.9 mmol/L (ref 3.5–5.1)
SODIUM: 139 mmol/L (ref 135–145)

## 2014-07-02 LAB — LIPID PANEL
CHOLESTEROL: 130 mg/dL (ref 0–200)
HDL: 40 mg/dL (ref >39)
LDL Cholesterol: 67 mg/dL (ref 0–99)
Total CHOL/HDL Ratio: 3.3 RATIO
Triglycerides: 114 mg/dL (ref <150)
VLDL: 23 mg/dL (ref 0–40)

## 2014-07-02 LAB — TROPONIN I
TROPONIN I: 0.36 ng/mL — AB (ref <0.031)
Troponin I: 0.48 ng/mL — ABNORMAL HIGH (ref <0.031)

## 2014-07-02 MED ORDER — SODIUM CHLORIDE 0.9 % IV SOLN
INTRAVENOUS | Status: AC
Start: 2014-07-02 — End: 2014-07-04
  Administered 2014-07-02 – 2014-07-03 (×2): via INTRAVENOUS
  Filled 2014-07-02: qty 1000

## 2014-07-02 NOTE — Progress Notes (Signed)
STROKE TEAM PROGRESS NOTE   HISTORY Tracy [name] is an 81 y.o. female with a history of a mini-stroke many years ago who presents with complaints of left sided numbness. Patient reports that on yesterday noted numbness in her left leg. Then noted numbness in the last two fingers of her left hand and into her arm. Then whole hand eventually became numb. Patient also has an area of numbness on the left side of her mouth. Symptoms persisted overnight and this morning she was unable to ambulate without assistance. Patient presented for follow up at that time.She was last known well 06/30/2014 at 18:00. Patient was not administered TPA secondary to Outside time window. She was admitted for further evaluation and treatment.   SUBJECTIVE (INTERVAL HISTORY) Her family is at the bedside.  Per daughter she has a history of an irregular heart beat with bradycardia which has been followed by Dr. Johney Frame. They had wanted to put in a pacer but she does not have one. Daughter thinks Dr. Oliver Barre placed her on dual antiplatelets, indications not clear. They report a hx of a stroke 10-12 years ago with constant repeat herself for 3-4 days.    OBJECTIVE Temp:  [97.6 F (36.4 C)-98.2 F (36.8 C)] 97.7 F (36.5 C) (03/29 2103) Pulse Rate:  [50-65] 63 (03/29 2103) Cardiac Rhythm:  [-]  Resp:  [16-20] 18 (03/29 2103) BP: (139-167)/(53-96) 167/96 mmHg (03/29 2103) SpO2:  [96 %-100 %] 98 % (03/29 2103)   Recent Labs Lab 07/01/14 1203 07/01/14 1322  GLUCAP 71 75    Recent Labs Lab 07/01/14 1020 07/01/14 1806 07/02/14 0932  NA 142  --  139  K 4.6  --  3.9  CL 109  --  107  CO2 23  --  26  GLUCOSE 107*  --  80  BUN 28*  --  20  CREATININE 1.31* 1.16* 1.15*  CALCIUM 10.2  --  9.3    Recent Labs Lab 07/01/14 1020  AST 36  ALT 13  ALKPHOS 28*  BILITOT 1.0  PROT 6.7  ALBUMIN 3.8    Recent Labs Lab 07/01/14 1020  WBC 6.7  NEUTROABS 4.7  HGB 13.1  HCT 39.7  MCV 90.8  PLT  346    Recent Labs Lab 07/01/14 1020 07/01/14 1806 07/01/14 2350 07/02/14 0230  TROPONINI 0.54* 0.53* 0.48* 0.36*    Recent Labs  07/01/14 1020  LABPROT 15.1  INR 1.18    Recent Labs  07/01/14 1510  COLORURINE YELLOW  LABSPEC 1.015  PHURINE 7.5  GLUCOSEU NEGATIVE  HGBUR NEGATIVE  BILIRUBINUR NEGATIVE  KETONESUR NEGATIVE  PROTEINUR NEGATIVE  UROBILINOGEN 0.2  NITRITE NEGATIVE  LEUKOCYTESUR TRACE*       Component Value Date/Time   CHOL 130 07/02/2014 0230   TRIG 114 07/02/2014 0230   HDL 40 07/02/2014 0230   CHOLHDL 3.3 07/02/2014 0230   VLDL 23 07/02/2014 0230   LDLCALC 67 07/02/2014 0230   No results found for: HGBA1C No results found for: LABOPIA, COCAINSCRNUR, LABBENZ, AMPHETMU, THCU, LABBARB  No results for input(s): ETH in the last 168 hours.  Ct Head (brain) Wo Contrast 07/01/2014    1. No acute intracranial pathology. 2. Chronic microvascular disease and cerebral atrophy. 3. Bilateral old parieto-occipital lobe infarcts and left cerebellar infarct.     Mr Brain Wo Contrast 07/01/2014    Severely motion degraded exam. 1 cm acute infarction in the right lateral thalamus.  Extensive old ischemic changes elsewhere throughout the brain.  Dg Chest Port 1 View 07/01/2014   Multiple granulomas. No edema or consolidation. Mild cardiomegaly. Incomplete visualization of sclerosis in the right proximal humerus. This area may represent bone infarct. It may be prudent to consider dedicated right humerus radiographs to further evaluate this region.     Mr Maxine Glenn Head/brain Wo Cm 07/01/2014   Motion degraded MR angiogram. Major vessels all show flow except for the right vertebral artery.     Carotid Doppler  03/26/2014 R ICA 40-59% stenosis with increase in velocity since last dopplers; patent L CEA site without restenosis  2D Echocardiogram  - LVEF 60-65%, moderately increased LV wall thickness, diastolic dysfunction, indeterminate LV filling pressure, mild MR,  moderateRAE, normal LA size.  CUS - Bilateral: 1-39% ICA stenosis. Vertebral artery flow is antegrade.   PHYSICAL EXAM  Temp:  [97.6 F (36.4 C)-98.2 F (36.8 C)] 97.7 F (36.5 C) (03/29 2103) Pulse Rate:  [50-65] 63 (03/29 2103) Resp:  [16-20] 18 (03/29 2103) BP: (139-167)/(53-96) 167/96 mmHg (03/29 2103) SpO2:  [96 %-100 %] 98 % (03/29 2103)  General - thin built, well developed, in no apparent distress.  Ophthalmologic - fundi not visualized due to eye movement.  Cardiovascular - bradycardia, regular rhythm but with intermittent premature beat.  Mental Status -  Level of arousal and orientation to time, place, and person were intact. Language including expression, naming, repetition, comprehension was assessed and found intact.  Cranial Nerves II - XII - II - Visual field intact OU. III, IV, VI - Extraocular movements intact. V - Facial sensation mildly decreased around left upper lip. VII - Facial movement intact bilaterally. VIII - Hearing & vestibular intact bilaterally. X - Palate elevates symmetrically. XI - Chin turning & shoulder shrug intact bilaterally. XII - Tongue protrusion intact.  Motor Strength - The patient's strength was normal in all extremities except LLE 4+/5 distally and pronator drift was absent.  Bulk was normal and fasciculations were absent.   Motor Tone - Muscle tone was assessed at the neck and appendages and was normal.  Reflexes - The patient's reflexes were 1+ in all extremities and she had no pathological reflexes.  Sensory - Light touch, temperature/pinprick were mildly decreased on the left hand and left foot.    Coordination - The patient had normal movements in the hands and feet with no ataxia or dysmetria.  Tremor was absent.  Gait and Station - not tested due to safety concerns.   ASSESSMENT/PLAN [name] is a 81 y.o. female with history of history of hypertension, sick sinus syndrome, bradycardia, carotid  stenosis, PVD, and stroke presenting with left sided weakness and numbness. She did not receive IV t-PA due to delay in arrival.   Stroke:  right lateral thalamic infarct, likely  secondary to small vessel disease source  No known hx of atrial fibrillation - while it is in problem list, it is not recorded in any cardiology or IM notes prior to this admission  Resultant  L hemisensory deficit, LLE hemiparesis  MRI  R lateral thalamic infarct  MRA  R VA without flow, o/w no hemodynamically significant stenosis  Carotid Doppler unremarkable  2D Echo  No source of embolus   HgbA1c pending. Last checked 5.8  Heparin 5000 units sq tid for VTE prophylaxis Diet Heart Room service appropriate?: Yes; Fluid consistency:: Thin  aspirin 81 mg orally every day, clopidogrel 75 mg orally every day and tretnal prior to admission, now on aspirin 81 mg orally every day, clopidogrel  75 mg orally every day and trental   Due to lack of risk factor and failed ASA and plavix dural antiplatelet treatment, with ? Afib diagnosis on the problem list, will recommend 30 day cardiac event monitoring as outpt to rule out afib.   Patient and family counseled to be compliant with her antithrombotic medications  Ongoing aggressive stroke risk factor management  Therapy recommendations:  CIR. Consult placed.  Disposition:  pending   Hypertension  Home meds:   Amlodipine-benazepril  Permissive hypertension (OK if < 220/120) but gradually normalize in 5-7 days Not on BP meds currently stable  Hyperlipidemia  Home meds:  Fenofibrate, resumed in hospital  LDL 67, at goal < 70  Continue fenofibrate  Other Stroke Risk Factors  Advanced age  Hx stroke - [age]with sx of loss of short term memory, "kept repeating things" per family. No documentation of event in EPIC.  Other Active Problems  Elevated troponins  Bradycardia. Hx sick sinus syndrome. Refused pacer in past  Dysuria. Cx Pending.  Elevated  creatinine.  Hospital day # 1  Rhoderick MoodyBIBY,SHARON  [facility] See Amion for Pager information 07/02/2014 11:05 AM   I, the attending vascular neurologist, have personally obtained a history, examined the patient, evaluated laboratory data, individually viewed imaging studies and agree with radiology interpretations. I also obtained additional history from pt's multiple family members at bedside. Together with the NP/PA, we formulated the assessment and plan of care which reflects our mutual decision.  I have made any additions or clarifications directly to the above note and agree with the findings and plan as currently documented.   [age] yo F with hx of HTN, HLD, bradycardia, ?stroke in 2002 was admitted for right thalamic infarct. Stroke work up so far unremarkable. LDL 67 and A1C pending. Her stroke in 2002 also in question. No definite diagnosis of Afib also it was on the problem list. Real stroke risk factor is advanced age. She is already on ASA and plavix. Due to lack of risk factor and failed ASA and plavix dural antiplatelet treatment, with ? Afib diagnosis on the problem list, will recommend 30 day cardiac event monitoring as outpt.   Marvel PlanJindong Eleanna Theilen, MD PhD Stroke Neurology 07/02/2014 10:02 PM      To contact Stroke Continuity provider, please refer to [email or web address]. After hours, contact General Neurology

## 2014-07-02 NOTE — Progress Notes (Signed)
Received prescreen request for inpatient rehab and noted that rehab consult order has already been placed. An admission coordinator will follow up after rehab consult completed. Thanks.  Juliann MuleJanine Tirza Senteno, PT Rehabilitation Admissions Coordinator [phone]

## 2014-07-02 NOTE — Progress Notes (Signed)
Speech Language Pathology Treatment: Dysphagia  Patient Details Name: Tracy [name] MRN: [id number] DOB: 03-16-1927 Today's Date: 07/02/2014 Time: 7741-2878 SLP Time Calculation (min) (ACUTE ONLY): 10 min  Assessment / Plan / Recommendation Clinical Impression  Skilled treatment session focused on addressing dysphagia goals.  Upon SLP entering room patient was finishing breakfast of toast and thin liquids liquids via straw.  Patient and family reported no difficulty with breakfast and patient was overall Mod I for management of pocketing despite needing question cues to verbalize previously taught strategy.  SLP also briefly screened speech-language abilities and family confirmed no changes from baseline in the areas of memory or safety.  As a result, SLP signing off for dysphagia goals and SLE.     HPI HPI: 81 y.o. female with a history hypertension who reports left sided numbness and difficulty with gait. Head CT shows chronic bilateral occipital infarcts and a left cerebellar infarct. No acute changes were noted.MRI pending.   Pertinent Vitals Pain Assessment: No/denies pain  SLP Plan  All goals met    Recommendations Diet recommendations: Regular;Thin liquid Liquids provided via: Straw Medication Administration: Whole meds with liquid Supervision: Patient able to self feed;Intermittent supervision to cue for compensatory strategies Compensations: Slow rate;Small sips/bites;Check for pocketing Postural Changes and/or Swallow Maneuvers: Seated upright 90 degrees;Upright 30-60 min after meal              Oral Care Recommendations: Oral care BID Follow up Recommendations: None Plan: All goals met    GO    [name]., CCC-SLP [phone]  Tracy [name] 07/02/2014, 10:11 AM

## 2014-07-02 NOTE — Progress Notes (Signed)
  Echocardiogram 2D Echocardiogram has been performed.  [name], Tracy [name] 07/02/2014, 1:50 PM

## 2014-07-02 NOTE — Progress Notes (Signed)
[facility] PROGRESS NOTE  Tracy [name] WJX:[id number] DOB: 1926-08-11 DOA: 07/01/2014 PCP: No primary care provider on file.  Assessment/Plan: #1 acute CVA: Right lateral thalamic infarct likely secondary to small vessel disease Patient had presented with left-sided numbness and left lower extremity weakness. MRI of the head consistent with right thalamic infarct. MRA right vertebral artery with outflow otherwise no hemodynamic significant stenosis. Carotid Dopplers from December 2015 with right ICA of 40-59% stenosis with increase in velocity. Patent left CEA site without restenosis. 2-D echo which was done with no source of emboli. Continue aspirin, Plavix, Trental for secondary stroke prevention. Risk factor modification. PT/OT.  #2 hypertension Permissive hypertension. Probably resume home medications tomorrow.  #3 left hip pain Will check plain films of the left hip to rule out fracture.  #4 hyperlipidemia LDL of 67. Continue TriCor.  #5 elevated troponins  Likely secondary to demand ischemia. Cardiac enzymes trending down. Patient asymptomatic. 2-D echo with EF of 60-65%. Grade 1 diastolic dysfunction. Moderately increased left ventricular wall thickness. EKG unremarkable. Follow.  #6 bradycardia Patient with history of sick sinus syndrome and has refused pacer in the past. Norvasc on hold. Follow.  #7 prophylaxis Heparin for DVT prophylaxis.  Code Status: Full Family Communication: Updated family at bedside. Disposition Plan: CIR versus home with home health   Consultants:  Neurology: Dr. Thad Ranger 07/01/2014  Procedures:  CT head 07/01/2014  MRI MRA head 07/01/2014  2-D echo 07/02/2014  Chest x-ray 07/01/2014  Antibiotics:  None  HPI/Subjective: Per family patient was complaining of hip pain. Per family patient fell prior to admission. Patient with dysarthria. Patient with left-sided numbness.  Objective: Filed Vitals:   07/02/14 1736  BP:  167/53  Pulse: 54  Temp: 97.9 F (36.6 C)  Resp: 16   No intake or output data in the 24 hours ending 07/02/14 1910 Filed Weights   07/01/14 0958  Weight: 52.617 kg (116 lb)    Exam:   General:  NAD. Dysarthria  Cardiovascular: RRR  Respiratory: CTAB  Abdomen: Soft, nontender, nondistended, positive bowel sounds.  Musculoskeletal: No clubbing cyanosis or edema.  Data Reviewed: Basic Metabolic Panel:  Recent Labs Lab 07/01/14 1020 07/01/14 1806 07/02/14 0932  NA 142  --  139  K 4.6  --  3.9  CL 109  --  107  CO2 23  --  26  GLUCOSE 107*  --  80  BUN 28*  --  20  CREATININE 1.31* 1.16* 1.15*  CALCIUM 10.2  --  9.3   Liver Function Tests:  Recent Labs Lab 07/01/14 1020  AST 36  ALT 13  ALKPHOS 28*  BILITOT 1.0  PROT 6.7  ALBUMIN 3.8   No results for input(s): LIPASE, AMYLASE in the last 168 hours. No results for input(s): AMMONIA in the last 168 hours. CBC:  Recent Labs Lab 07/01/14 1020  WBC 6.7  NEUTROABS 4.7  HGB 13.1  HCT 39.7  MCV 90.8  PLT 346   Cardiac Enzymes:  Recent Labs Lab 07/01/14 1020 07/01/14 1806 07/01/14 2350 07/02/14 0230  TROPONINI 0.54* 0.53* 0.48* 0.36*   BNP (last 3 results) No results for input(s): BNP in the last 8760 hours.  ProBNP (last 3 results) No results for input(s): PROBNP in the last 8760 hours.  CBG:  Recent Labs Lab 07/01/14 1203 07/01/14 1322  GLUCAP 71 75    Recent Results (from the past 240 hour(s))  Urine culture     Status: None   Collection Time: 07/01/14  3:10 PM  Result Value Ref Range Status   Specimen Description URINE, CLEAN CATCH  Final   Special Requests NONE  Final   Colony Count   Final    30,000 COLONIES/ML Performed at [facility]    Culture   Final    Multiple bacterial morphotypes present, none predominant. Suggest appropriate recollection if clinically indicated. Performed at [facility]    Report Status 07/02/2014 FINAL  Final      Studies: Ct Head (brain) Wo Contrast  07/01/2014   CLINICAL DATA:  Left-sided numbness and weakness since yesterday.  EXAM: CT HEAD WITHOUT CONTRAST  TECHNIQUE: Contiguous axial images were obtained from the base of the skull through the vertex without intravenous contrast.  COMPARISON:  None.  FINDINGS: There is no evidence of mass effect, midline shift, or extra-axial fluid collections. There is no evidence of a space-occupying lesion or intracranial hemorrhage. There is no evidence of a cortical-based area of acute infarction. There are bilateral old parieto-occipital lobe infarcts. There is an old left cerebellar infarct. There is a small left lacunar sub insular infarct. There is generalized cerebral atrophy. There is periventricular white matter low attenuation likely secondary to microangiopathy.  The ventricles and sulci are appropriate for the patient's age. The basal cisterns are patent.  Visualized portions of the orbits are unremarkable. The visualized portions of the paranasal sinuses and mastoid air cells are unremarkable. Cerebrovascular atherosclerotic calcifications are noted.  The osseous structures are unremarkable.  IMPRESSION: 1. No acute intracranial pathology. 2. Chronic microvascular disease and cerebral atrophy. 3. Bilateral old parieto-occipital lobe infarcts and left cerebellar infarct.   Electronically Signed   By: Elige Ko   On: 07/01/2014 11:11   Mr Brain Wo Contrast  07/01/2014   CLINICAL DATA:  Left-sided weakness and numbness.  Acute onset.  EXAM: MRI HEAD WITHOUT CONTRAST  MRA HEAD WITHOUT CONTRAST  TECHNIQUE: Multiplanar, multiecho pulse sequences of the brain and surrounding structures were obtained without intravenous contrast. Angiographic images of the head were obtained using MRA technique without contrast.  COMPARISON:  CT scan same day.  FINDINGS: MRI HEAD FINDINGS  The study suffers from motion degradation. There is a 1 cm acute infarction in the right lateral  thalamus. No evidence of hemorrhage or mass effect. Elsewhere, there are old cerebellar infarctions an old bilateral occipital infarctions. There chronic small vessel infarctions within the thalami, basal ganglia and hemispheric deep white matter.  MRA HEAD FINDINGS  Very motion limited. Both internal carotid arteries show antegrade flow. Flow is present at least proximally within the anterior and middle cerebral arteries. No antegrade flow seen in the right vertebral artery. Left vertebral artery is patent to the basilar. Flow is present in both PCAs proximally.  IMPRESSION: Severely motion degraded exam. 1 cm acute infarction in the right lateral thalamus.  Extensive old ischemic changes elsewhere throughout the brain as outlined above.  Motion degraded MR angiogram. Major vessels all show flow except for the right vertebral artery.   Electronically Signed   By: Paulina Fusi M.D.   On: 07/01/2014 20:57   Dg Chest Port 1 View  07/01/2014   CLINICAL DATA:  Bradycardia  EXAM: PORTABLE CHEST - 1 VIEW  COMPARISON:  None.  FINDINGS: There are scattered calcified granulomas throughout the lungs bilaterally. There is no edema or consolidation. Heart is mildly enlarged with pulmonary vascularity within normal limits. No adenopathy. There is atherosclerotic change in aorta. There is sclerosis in the proximal right humerus.  IMPRESSION:  Multiple granulomas. No edema or consolidation. Mild cardiomegaly. Incomplete visualization of sclerosis in the right proximal humerus. This area may represent bone infarct. It may be prudent to consider dedicated right humerus radiographs to further evaluate this region.   Electronically Signed   By: Bretta BangWilliam  Woodruff III M.D.   On: 07/01/2014 12:13   Mr Maxine GlennMra Head/brain Wo Cm  07/01/2014   CLINICAL DATA:  Left-sided weakness and numbness.  Acute onset.  EXAM: MRI HEAD WITHOUT CONTRAST  MRA HEAD WITHOUT CONTRAST  TECHNIQUE: Multiplanar, multiecho pulse sequences of the brain and  surrounding structures were obtained without intravenous contrast. Angiographic images of the head were obtained using MRA technique without contrast.  COMPARISON:  CT scan same day.  FINDINGS: MRI HEAD FINDINGS  The study suffers from motion degradation. There is a 1 cm acute infarction in the right lateral thalamus. No evidence of hemorrhage or mass effect. Elsewhere, there are old cerebellar infarctions an old bilateral occipital infarctions. There chronic small vessel infarctions within the thalami, basal ganglia and hemispheric deep white matter.  MRA HEAD FINDINGS  Very motion limited. Both internal carotid arteries show antegrade flow. Flow is present at least proximally within the anterior and middle cerebral arteries. No antegrade flow seen in the right vertebral artery. Left vertebral artery is patent to the basilar. Flow is present in both PCAs proximally.  IMPRESSION: Severely motion degraded exam. 1 cm acute infarction in the right lateral thalamus.  Extensive old ischemic changes elsewhere throughout the brain as outlined above.  Motion degraded MR angiogram. Major vessels all show flow except for the right vertebral artery.   Electronically Signed   By: Paulina FusiMark  Shogry M.D.   On: 07/01/2014 20:57    Scheduled Meds: . aspirin EC  81 mg Oral Daily  . clopidogrel  75 mg Oral Daily  . fenofibrate  160 mg Oral Daily  . heparin  5,000 Units Subcutaneous 3 times per day  . latanoprost  1 drop Both Eyes QHS  . pentoxifylline  400 mg Oral Daily  . polyethylene glycol  17 g Oral Daily   Continuous Infusions: . sodium chloride 0.9 % 1,000 mL infusion 75 mL/hr at 07/02/14 1038    Principal Problem:   CVA (cerebral infarction) Active Problems:   Bradycardia   Elevated troponin   Dysuria   Constipation   Stroke   Hip pain, acute    Time spent: 40 minutes    Furious Chiarelli M.D. [facility] Pager [phone]. If 7PM-7AM, please contact night-coverage at [email or web address], password  [email or web address] 07/02/2014, 7:10 PM  LOS: 1 day

## 2014-07-02 NOTE — Evaluation (Signed)
Physical Therapy Evaluation Patient Details Name: Tracy [name] Fatzinger MRN: [id number] DOB: [birth year] Today's Date: 07/02/2014   History of Present Illness  Patient is a 81 y/o female who presents to the ER with left sided weakness and numbness s/p fall in bathroom bruising left knee. PMH of HTN and CVA. In the ED, pt found to have elevated troponin of 0.54,  bradycardia between 45 and 52, creatinine mildly elevated at 1.3. CT head (-). MRI + right lateral thalamus infarct.    Clinical Impression  Patient presents with left hemiparesis impacting balance and mobility. Tolerated pre gait activities in standing however gait not attempted due to safety concerns. Able to perform SPT with Mod A and cues. Discussed disposition options with family and pt. Pt independent and driving PTA. Pt highly motivated with a very supportive family. Pt would benefit from skilled PT and CIR to improve transfers, gait, balance and mobility so pt can maximize independence and ease burden of care prior to return home.    Follow Up Recommendations CIR    Equipment Recommendations  Other (comment) (TBD.)    Recommendations for Other Services OT consult     Precautions / Restrictions Precautions Precautions: Fall Restrictions Weight Bearing Restrictions: No      Mobility  Bed Mobility Overal bed mobility: Needs Assistance Bed Mobility: Rolling;Sidelying to Sit;Sit to Supine Rolling: Min guard Sidelying to sit: Mod assist;HOB elevated   Sit to supine: Min guard   General bed mobility comments: Mod A to elevate trunk. Max cues to perform transfer and for technique, "i can't i can't" Use of rails for support.   Transfers Overall transfer level: Needs assistance Equipment used: 1 person hand held assist Transfers: Sit to/from UGI CorporationStand;Stand Pivot Transfers Sit to Stand: Mod assist Stand pivot transfers: Mod assist       General transfer comment: Mod A to stand from EOB. Very unsteady with increased sway  noted upon standing. Bil knee instability, L>R. SPT bed to/from chair x2 Mod A with cues for technique and therapist blocking LLE.  Ambulation/Gait             General Gait Details: Deferred due to safety concerns.  Stairs            Wheelchair Mobility    Modified Rankin (Stroke Patients Only) Modified Rankin (Stroke Patients Only) Pre-Morbid Rankin Score: No symptoms Modified Rankin: Severe disability     Balance Overall balance assessment: Needs assistance Sitting-balance support: Feet supported;Single extremity supported Sitting balance-Leahy Scale: Fair     Standing balance support: During functional activity Standing balance-Leahy Scale: Zero Standing balance comment: Requires Min-Mod A to maintain standing balance.                              Pertinent Vitals/Pain Pain Assessment: Faces Faces Pain Scale: Hurts little more Pain Location: left hip with movement. Pain Descriptors / Indicators: Sore Pain Intervention(s): Limited activity within patient's tolerance;Monitored during session;Repositioned    Home Living Family/patient expects to be discharged to:: Private residence Living Arrangements: Children Available Help at Discharge: Family;Available 24 hours/day Type of Home: House Home Access: Level entry     Home Layout: One level Home Equipment: None      Prior Function Level of Independence: Independent         Comments: Pt drives.      Hand Dominance   Dominant Hand: Right    Extremity/Trunk Assessment   Upper Extremity Assessment: Defer to  OT evaluation;LUE deficits/detail       LUE Deficits / Details: Generalized weakness noted compared to [name].   Lower Extremity Assessment: LLE deficits/detail;Generalized weakness   LLE Deficits / Details: Difficult to assess due to pain with AROM (from fall). Grossly ~2+-3/5 throughout hip, knee, ankle.     Communication   Communication: No difficulties  Cognition  Arousal/Alertness: Awake/alert Behavior During Therapy: WFL for tasks assessed/performed Overall Cognitive Status: Within Functional Limits for tasks assessed                      General Comments General comments (skin integrity, edema, etc.): Family present in room, Discussed disposition options.    Exercises Other Exercises Other Exercises: Performed pregait activities in standing with therapist blocking LLE during clearing RLE and Mod A for weight shifting and marching.      Assessment/Plan    PT Assessment Patient needs continued PT services  PT Diagnosis Difficulty walking;Generalized weakness   PT Problem List Decreased strength;Pain;Decreased coordination;Decreased range of motion;Impaired sensation;Decreased activity tolerance;Impaired tone;Decreased balance;Decreased mobility  PT Treatment Interventions Balance training;Gait training;Neuromuscular re-education;Patient/family education;Functional mobility training;Therapeutic activities;Therapeutic exercise   PT Goals (Current goals can be found in the Care Plan section) Acute Rehab PT Goals Patient Stated Goal: "to be myself again" PT Goal Formulation: With patient Time For Goal Achievement: 07/16/14 Potential to Achieve Goals: Fair    Frequency Min 4X/week   Barriers to discharge        Co-evaluation               End of Session Equipment Utilized During Treatment: Gait belt Activity Tolerance: Patient tolerated treatment well Patient left: in bed;with call bell/phone within reach;with family/visitor present;Other (comment) (Doppler tech present inr oom) Nurse Communication: Mobility status         Time: 1510-1540 PT Time Calculation (min) (ACUTE ONLY): 30 min   Charges:   PT Evaluation $Initial PT Evaluation Tier I: 1 Procedure PT Treatments $Therapeutic Activity: 8-22 mins   PT G CodesAlvie Heidelberg A 07-10-2014, 3:52 PM  Alvie Heidelberg, PT, DPT [phone]

## 2014-07-02 NOTE — Progress Notes (Signed)
VASCULAR LAB PRELIMINARY  PRELIMINARY  PRELIMINARY  PRELIMINARY  Carotid duplex  completed.    Preliminary report:  Bilateral:  1-39% ICA stenosis.  Vertebral artery flow is antegrade.      Cyndal Kasson, RVT 07/02/2014, 4:02 PM

## 2014-07-03 ENCOUNTER — Encounter (HOSPITAL_COMMUNITY): Payer: Self-pay | Admitting: Physical Medicine and Rehabilitation

## 2014-07-03 DIAGNOSIS — I638 Other cerebral infarction: Secondary | ICD-10-CM

## 2014-07-03 DIAGNOSIS — G8190 Hemiplegia, unspecified affecting unspecified side: Secondary | ICD-10-CM

## 2014-07-03 LAB — CBC
HCT: 38.9 % (ref 36.0–46.0)
Hemoglobin: 12.5 g/dL (ref 12.0–15.0)
MCH: 29.6 pg (ref 26.0–34.0)
MCHC: 32.1 g/dL (ref 30.0–36.0)
MCV: 92 fL (ref 78.0–100.0)
Platelets: 307 10*3/uL (ref 150–400)
RBC: 4.23 MIL/uL (ref 3.87–5.11)
RDW: 14.2 % (ref 11.5–15.5)
WBC: 5.4 10*3/uL (ref 4.0–10.5)

## 2014-07-03 LAB — BASIC METABOLIC PANEL
ANION GAP: 6 (ref 5–15)
BUN: 16 mg/dL (ref 6–23)
CALCIUM: 8.9 mg/dL (ref 8.4–10.5)
CO2: 23 mmol/L (ref 19–32)
Chloride: 112 mmol/L (ref 96–112)
Creatinine, Ser: 0.95 mg/dL (ref 0.50–1.10)
GFR calc Af Amer: 62 mL/min — ABNORMAL LOW (ref >90)
GFR, EST NON AFRICAN AMERICAN: 53 mL/min — AB (ref >90)
GLUCOSE: 103 mg/dL — AB (ref 70–99)
Potassium: 4.2 mmol/L (ref 3.5–5.1)
Sodium: 141 mmol/L (ref 135–145)

## 2014-07-03 LAB — HEMOGLOBIN A1C
Hgb A1c MFr Bld: 6.3 % — ABNORMAL HIGH (ref 4.8–5.6)
Mean Plasma Glucose: 134 mg/dL

## 2014-07-03 MED ORDER — ATORVASTATIN CALCIUM 10 MG PO TABS
10.0000 mg | ORAL_TABLET | Freq: Every day | ORAL | Status: DC
  Administered 2014-07-03: 10 mg via ORAL
  Filled 2014-07-03: qty 1

## 2014-07-03 NOTE — Progress Notes (Signed)
Rehab admissions - I met with pt and her two sons in follow up to rehab MD consult to explain the possibility of inpatient rehab. All are interested in pursuing inpatient rehab. Further questions were answered and informational brochures were given.   Pt has [facility] Medicare and I will now open her case with insurance to seek authorization.  I will keep the pt/family and medical team aware of any updates. We will consider possible inpatient rehab admission pending her insurance approval, medical clearance and our bed availability.  Thanks.  Nanetta Batty, PT Rehabilitation Admissions Coordinator [phone]

## 2014-07-03 NOTE — Progress Notes (Signed)
OT Cancellation Note  Patient Details Name: Tracy FinerOphelia W [name] MRN: [id number] DOB: [birth year]   Cancelled Treatment:    Reason Eval/Treat Not Completed: Medical issues which prohibited therapy. Patient with complaints of tingling in left fingers and face. RN recommended patient stay in bed at this time. Will check back to re-attempt OT evaluation as able and time allows.   Obrian Bulson , MS, OTR/L, CLT Pager: [phone]  07/03/2014, 8:31 AM

## 2014-07-03 NOTE — Consult Note (Addendum)
Primary cardiologist: New  HPI: 81 year old female for evaluation of bradycardia. Admitted on March 28 and diagnosed with a CVA. She has been noted to be intermittently bradycardic. Telemetry shows sinus bradycardia with occasional junctional rhythm. Troponins also noted to be elevated. Cardiology asked to evaluate. Patient denies dyspnea, chest pain, palpitations or syncope. Note patient apparently was told she needed a pacemaker in the past but I do not have those records available. There are no previous encounters in Epic.  Medications Prior to Admission  Medication Sig Dispense Refill  . amLODipine-benazepril (LOTREL) 10-40 MG per capsule Take 1 capsule by mouth daily.    Marland Kitchen aspirin EC 81 MG tablet Take 81 mg by mouth daily.    . clopidogrel (PLAVIX) 75 MG tablet Take 75 mg by mouth daily.    . fenofibrate micronized (LOFIBRA) 134 MG capsule Take 134 mg by mouth daily.    Marland Kitchen HYDROcodone-acetaminophen (NORCO/VICODIN) 5-325 MG per tablet Take 1 tablet by mouth every 6 (six) hours as needed for moderate pain.    Marland Kitchen latanoprost (XALATAN) 0.005 % ophthalmic solution Place 1 drop into both eyes daily.    . pentoxifylline (TRENTAL) 400 MG CR tablet Take 400 mg by mouth daily.      No Known Allergies  Past Medical History  Diagnosis Date  . Stroke   . Hypertension     History reviewed. No pertinent past surgical history.  History   Social History  . Marital Status: Widowed    Spouse Name: N/A  . Number of Children: N/A  . Years of Education: N/A   Occupational History  . Not on file.   Social History Main Topics  . Smoking status: Never Smoker   . Smokeless tobacco: Not on file  . Alcohol Use: No  . Drug Use: No  . Sexual Activity: Not on file   Other Topics Concern  . Not on file   Social History Narrative    Family History  Problem Relation Age of Onset  . Cancer Mother     colon    ROS:  Residual weakness and left lower extremity but no fevers or chills,  productive cough, hemoptysis, dysphasia, odynophagia, melena, hematochezia, dysuria, hematuria, rash, seizure activity, orthopnea, PND, pedal edema, claudication. Remaining systems are negative.  Physical Exam:   Blood pressure 150/59, pulse 48, temperature 98.3 F (36.8 C), temperature source Oral, resp. rate 16, height _0  (1.499 m), weight 116 lb (52.617 kg), SpO2 98 %.  General:  Well developed/well nourished in NAD Skin warm/dry Patient not depressed No peripheral clubbing Back-normal HEENT-normal/normal eyelids Neck supple/normal carotid upstroke bilaterally; no bruits; no JVD; no thyromegaly chest - CTA/ normal expansion CV - RRR/normal S1 and S2; no murmurs, rubs or gallops;  PMI nondisplaced Abdomen -NT/ND, no HSM, no mass, + bowel sounds, no bruit 2+ femoral pulses, no bruits Ext-no edema, no chords, diminished distal pulses Neuro-residual weakness left lower extremity from recent stroke  ECG sinus bradycardia with PACs, left ventricular hypertrophy with repolarization abnormality.  Results for orders placed or performed during the hospital encounter of 07/01/14 (from the past 48 hour(s))  CBG monitoring, ED     Status: None   Collection Time: 07/01/14  1:22 PM  Result Value Ref Range   Glucose-Capillary 75 70 - 99 mg/dL  Urine culture     Status: None   Collection Time: 07/01/14  3:10 PM  Result Value Ref Range   Specimen Description URINE, CLEAN CATCH    Special Requests  NONE    Colony Count      30,000 COLONIES/ML Performed at [facility]    Culture      Multiple bacterial morphotypes present, none predominant. Suggest appropriate recollection if clinically indicated. Performed at [facility]    Report Status 07/02/2014 FINAL   Urinalysis, Routine w reflex microscopic     Status: Abnormal   Collection Time: 07/01/14  3:10 PM  Result Value Ref Range   Color, Urine YELLOW YELLOW   APPearance CLOUDY (A) CLEAR   Specific Gravity, Urine  1.015 1.005 - 1.030   pH 7.5 5.0 - 8.0   Glucose, UA NEGATIVE NEGATIVE mg/dL   Hgb urine dipstick NEGATIVE NEGATIVE   Bilirubin Urine NEGATIVE NEGATIVE   Ketones, ur NEGATIVE NEGATIVE mg/dL   Protein, ur NEGATIVE NEGATIVE mg/dL   Urobilinogen, UA 0.2 0.0 - 1.0 mg/dL   Nitrite NEGATIVE NEGATIVE   Leukocytes, UA TRACE (A) NEGATIVE  Urine microscopic-add on     Status: None   Collection Time: 07/01/14  3:10 PM  Result Value Ref Range   Squamous Epithelial / LPF RARE RARE   WBC, UA 3-6 <3 WBC/hpf   Bacteria, UA RARE RARE   Urine-Other AMORPHOUS URATES/PHOSPHATES   Creatinine, serum     Status: Abnormal   Collection Time: 07/01/14  6:06 PM  Result Value Ref Range   Creatinine, Ser 1.16 (H) 0.50 - 1.10 mg/dL   GFR calc non Af Amer 42 (L) >90 mL/min   GFR calc Af Amer 48 (L) >90 mL/min    Comment: (NOTE) The eGFR has been calculated using the CKD EPI equation. This calculation has not been validated in all clinical situations. eGFR's persistently <90 mL/min signify possible Chronic Kidney Disease.   Troponin I (q 6hr x 3)     Status: Abnormal   Collection Time: 07/01/14  6:06 PM  Result Value Ref Range   Troponin I 0.53 (HH) <0.031 ng/mL    Comment:        POSSIBLE MYOCARDIAL ISCHEMIA. SERIAL TESTING RECOMMENDED. REPEATED TO VERIFY CRITICAL VALUE NOTED.  VALUE IS CONSISTENT WITH PREVIOUSLY REPORTED AND CALLED VALUE.   Troponin I (q 6hr x 3)     Status: Abnormal   Collection Time: 07/01/14 11:50 PM  Result Value Ref Range   Troponin I 0.48 (H) <0.031 ng/mL    Comment:        PERSISTENTLY INCREASED TROPONIN VALUES IN THE RANGE OF 0.04-0.49 ng/mL CAN BE SEEN IN:       -UNSTABLE ANGINA       -CONGESTIVE HEART FAILURE       -MYOCARDITIS       -CHEST TRAUMA       -ARRYHTHMIAS       -LATE PRESENTING MYOCARDIAL INFARCTION       -COPD   CLINICAL FOLLOW-UP RECOMMENDED.   Troponin I (q 6hr x 3)     Status: Abnormal   Collection Time: 07/02/14  2:30 AM  Result Value Ref Range     Troponin I 0.36 (H) <0.031 ng/mL    Comment:        PERSISTENTLY INCREASED TROPONIN VALUES IN THE RANGE OF 0.04-0.49 ng/mL CAN BE SEEN IN:       -UNSTABLE ANGINA       -CONGESTIVE HEART FAILURE       -MYOCARDITIS       -CHEST TRAUMA       -ARRYHTHMIAS       -LATE PRESENTING MYOCARDIAL INFARCTION       -  COPD   CLINICAL FOLLOW-UP RECOMMENDED.   Hemoglobin A1c     Status: Abnormal   Collection Time: 07/02/14  2:30 AM  Result Value Ref Range   Hgb A1c MFr Bld 6.3 (H) 4.8 - 5.6 %    Comment: (NOTE)         Pre-diabetes: 5.7 - 6.4         Diabetes: >6.4         Glycemic control for adults with diabetes: <7.0    Mean Plasma Glucose 134 mg/dL    Comment: (NOTE) Performed At: [facility] [place], [place] [id number] Lindon Romp MD [id number]   Lipid panel     Status: None   Collection Time: 07/02/14  2:30 AM  Result Value Ref Range   Cholesterol 130 0 - 200 mg/dL   Triglycerides 114 <150 mg/dL   HDL 40 >39 mg/dL   Total CHOL/HDL Ratio 3.3 RATIO   VLDL 23 0 - 40 mg/dL   LDL Cholesterol 67 0 - 99 mg/dL    Comment:        Total Cholesterol/HDL:CHD Risk Coronary Heart Disease Risk Table                     Men   Women  1/2 Average Risk   3.4   3.3  Average Risk       5.0   4.4  2 X Average Risk   9.6   7.1  3 X Average Risk  23.4   11.0        Use the calculated Patient Ratio above and the CHD Risk Table to determine the patient's CHD Risk.        ATP III CLASSIFICATION (LDL):  <100     mg/dL   Optimal  100-129  mg/dL   Near or Above                    Optimal  130-159  mg/dL   Borderline  160-189  mg/dL   High  >190     mg/dL   Very High   Basic metabolic panel     Status: Abnormal   Collection Time: 07/02/14  9:32 AM  Result Value Ref Range   Sodium 139 135 - 145 mmol/L   Potassium 3.9 3.5 - 5.1 mmol/L   Chloride 107 96 - 112 mmol/L   CO2 26 19 - 32 mmol/L   Glucose, Bld 80 70 - 99 mg/dL   BUN 20 6 - 23 mg/dL   Creatinine, Ser  1.15 (H) 0.50 - 1.10 mg/dL   Calcium 9.3 8.4 - 10.5 mg/dL   GFR calc non Af Amer 42 (L) >90 mL/min   GFR calc Af Amer 49 (L) >90 mL/min    Comment: (NOTE) The eGFR has been calculated using the CKD EPI equation. This calculation has not been validated in all clinical situations. eGFR's persistently <90 mL/min signify possible Chronic Kidney Disease.    Anion gap 6 5 - 15  CBC     Status: None   Collection Time: 07/03/14  5:20 AM  Result Value Ref Range   WBC 5.4 4.0 - 10.5 K/uL   RBC 4.23 3.87 - 5.11 MIL/uL   Hemoglobin 12.5 12.0 - 15.0 g/dL   HCT 38.9 36.0 - 46.0 %   MCV 92.0 78.0 - 100.0 fL   MCH 29.6 26.0 - 34.0 pg   MCHC 32.1 30.0 - 36.0 g/dL  RDW 14.2 11.5 - 15.5 %   Platelets 307 150 - 400 K/uL  Basic metabolic panel     Status: Abnormal   Collection Time: 07/03/14  5:20 AM  Result Value Ref Range   Sodium 141 135 - 145 mmol/L   Potassium 4.2 3.5 - 5.1 mmol/L   Chloride 112 96 - 112 mmol/L   CO2 23 19 - 32 mmol/L   Glucose, Bld 103 (H) 70 - 99 mg/dL   BUN 16 6 - 23 mg/dL   Creatinine, Ser 0.95 0.50 - 1.10 mg/dL   Calcium 8.9 8.4 - 10.5 mg/dL   GFR calc non Af Amer 53 (L) >90 mL/min   GFR calc Af Amer 62 (L) >90 mL/min    Comment: (NOTE) The eGFR has been calculated using the CKD EPI equation. This calculation has not been validated in all clinical situations. eGFR's persistently <90 mL/min signify possible Chronic Kidney Disease.    Anion gap 6 5 - 15    Mr Brain Wo Contrast  07/01/2014   CLINICAL DATA:  Left-sided weakness and numbness.  Acute onset.  EXAM: MRI HEAD WITHOUT CONTRAST  MRA HEAD WITHOUT CONTRAST  TECHNIQUE: Multiplanar, multiecho pulse sequences of the brain and surrounding structures were obtained without intravenous contrast. Angiographic images of the head were obtained using MRA technique without contrast.  COMPARISON:  CT scan same day.  FINDINGS: MRI HEAD FINDINGS  The study suffers from motion degradation. There is a 1 cm acute infarction in  the right lateral thalamus. No evidence of hemorrhage or mass effect. Elsewhere, there are old cerebellar infarctions an old bilateral occipital infarctions. There chronic small vessel infarctions within the thalami, basal ganglia and hemispheric deep white matter.  MRA HEAD FINDINGS  Very motion limited. Both internal carotid arteries show antegrade flow. Flow is present at least proximally within the anterior and middle cerebral arteries. No antegrade flow seen in the right vertebral artery. Left vertebral artery is patent to the basilar. Flow is present in both PCAs proximally.  IMPRESSION: Severely motion degraded exam. 1 cm acute infarction in the right lateral thalamus.  Extensive old ischemic changes elsewhere throughout the brain as outlined above.  Motion degraded MR angiogram. Major vessels all show flow except for the right vertebral artery.   Electronically Signed   By: Nelson Chimes M.D.   On: 07/01/2014 20:57   Mr Jodene Nam Head/brain Wo Cm  07/01/2014   CLINICAL DATA:  Left-sided weakness and numbness.  Acute onset.  EXAM: MRI HEAD WITHOUT CONTRAST  MRA HEAD WITHOUT CONTRAST  TECHNIQUE: Multiplanar, multiecho pulse sequences of the brain and surrounding structures were obtained without intravenous contrast. Angiographic images of the head were obtained using MRA technique without contrast.  COMPARISON:  CT scan same day.  FINDINGS: MRI HEAD FINDINGS  The study suffers from motion degradation. There is a 1 cm acute infarction in the right lateral thalamus. No evidence of hemorrhage or mass effect. Elsewhere, there are old cerebellar infarctions an old bilateral occipital infarctions. There chronic small vessel infarctions within the thalami, basal ganglia and hemispheric deep white matter.  MRA HEAD FINDINGS  Very motion limited. Both internal carotid arteries show antegrade flow. Flow is present at least proximally within the anterior and middle cerebral arteries. No antegrade flow seen in the right  vertebral artery. Left vertebral artery is patent to the basilar. Flow is present in both PCAs proximally.  IMPRESSION: Severely motion degraded exam. 1 cm acute infarction in the right lateral thalamus.  Extensive  old ischemic changes elsewhere throughout the brain as outlined above.  Motion degraded MR angiogram. Major vessels all show flow except for the right vertebral artery.   Electronically Signed   By: Nelson Chimes M.D.   On: 07/01/2014 20:57   Dg Hip Unilat With Pelvis 2-3 Views Left  07/02/2014   CLINICAL DATA:  81 year old female with 2 day history of left-sided hip pain.  EXAM: LEFT HIP (WITH PELVIS) 2-3 VIEWS  COMPARISON:  No priors.  FINDINGS: AP view of the bony pelvis demonstrates no acute displaced fracture of the bony pelvic ring. Bilateral proximal femurs as visualized are intact, and the left femoral head is properly located. Mild degenerative changes of osteoarthritis in the hip joints bilaterally. Extensive atherosclerosis, with vascular stents noted in the right common iliac artery, and the left common and external iliac artery.  IMPRESSION: 1. No acute radiographic abnormality of the bony pelvis or the left hip. 2. Mild bilateral hip joint osteoarthritis.   Electronically Signed   By: Vinnie Langton M.D.   On: 07/02/2014 19:24    Assessment/Plan 1 bradycardia-telemetry has been reviewed. She has sinus bradycardia with occasional junctional beats. This apparently has been noted in years past but no records are available. She was told previously she needs a pacemaker. At present I do not find an indication for pacemaker and she also is clear that she would not agree to 2. Continue telemetry. 2 mildly elevated troponin-no chest pain or ischemic symptoms. No clear trend up. Would not pursue further ischemia evaluation. 3 recent CVA-long discussion today concerning potential implantable loop monitor. I explained that occasionally atrial fibrillation can cause strokes and be undiagnosed.  She and her sons are hesitant. They will consider. I explained that if atrial fibrillation is documented anticoagulation would reduce risk for further occurrences. They seem to understand. They will consider and make a final decision tomorrow. If they do not want implantable loop agree with event monitor. 4 hypertension-would resume ACE inhibitor as needed for blood pressure control. 5 history of carotid endarterectomy-continue aspirin. Would add statin long-term.  Kirk Ruths MD 07/03/2014, 12:53 PM  Addendum-old records are now available. Patient has 2 medical record numbers. Echocardiogram in December 2013 showed normal LV function, moderate left atrial enlargement and mild mitral regurgitation. There is an electrocardiogram dated February 2012 that was computer read as atrial fibrillation. However it shows sinus rhythm with occasional junctional escape beats. There is no documentation of prior atrial fibrillation. Patient also has been seen by Dr. Rayann Heman previously for bradycardia. At that time patient also declined pacemaker. Kirk Ruths

## 2014-07-03 NOTE — Consult Note (Signed)
Physical Medicine and Rehabilitation Consult   Reason for Consult: Left sided weakness and numbness Referring Physician: Dr. Roda ShuttersXu   HPI: Tracy [name] is a 81 y.o. 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 Family member  insisted on bringing her to ED for evaluation and patient was found to be bradycardic with HR 40-50s. CT head showed chronic bilateral occipital and left cerebellar infarcts and negative for acute changes. MRI/MRA brain done revealing 1 cm acute infarction in the right lateral thalamus, extensive old ischemic changes throughout the brain and no antegrade flow in R-VA.  2D echo with EF 60-655 with mild mitral calcification and moderately dilated right atrium.  Carotid dopplers without significant ICA stenosis. Pelvic films done due to complaints of left hip pain and revealed mild bilateral hip OA. Patient continues to have bouts of bradycardia but has refused PPM in the past. Dr. 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    Review of Systems  HENT: Negative for hearing loss.   Eyes: Negative for blurred vision and double vision.  Respiratory: Negative for cough and shortness of breath.   Cardiovascular: Negative for chest pain and palpitations.  Gastrointestinal: Negative for heartburn and nausea.  Musculoskeletal: Negative for myalgias, back pain, joint pain and falls.  Neurological: Positive for speech change and focal weakness. Negative for dizziness, weakness and headaches.      Past Medical History  Diagnosis Date  . Stroke   . Hypertension     History reviewed. No pertinent past surgical history.    Family History  Problem  Relation Age of Onset  . Cancer Mother     colon      Social History:  Widowed. Retired from [employer]. Son lives with her (retired and can provide supervision?). She was independent and active without AD. She reports that she has never smoked. She does not have any smokeless tobacco history on file. She reports that she does not drink alcohol or use illicit drugs.    Allergies: No Known Allergies    Medications Prior to Admission  Medication Sig Dispense Refill  . amLODipine-benazepril (LOTREL) 10-40 MG per capsule Take 1 capsule by mouth daily.    Marland Kitchen. aspirin EC 81 MG tablet Take 81 mg by mouth daily.    . clopidogrel (PLAVIX) 75 MG tablet Take 75 mg by mouth daily.    . fenofibrate micronized (LOFIBRA) 134 MG capsule Take 134 mg by mouth daily.    Marland Kitchen. HYDROcodone-acetaminophen (NORCO/VICODIN) 5-325 MG per tablet Take 1 tablet by mouth every 6 (six) hours as needed for moderate pain.    Marland Kitchen. latanoprost (XALATAN) 0.005 % ophthalmic solution Place 1 drop into both eyes daily.    . pentoxifylline (TRENTAL) 400 MG CR tablet Take 400 mg by mouth daily.      Home: Home Living Family/patient expects to be discharged to:: Private residence Living Arrangements: Children Available Help at Discharge: Family, Available 24 hours/day Type of Home: House Home Access: Level entry Home Layout: One level Home Equipment: None  Functional History: Prior Function Level of Independence: Independent Comments: Pt drives.  Functional Status:  Mobility: Bed Mobility Overal bed mobility: Needs  Assistance Bed Mobility: Rolling, Sidelying to Sit, Sit to Supine Rolling: Min guard Sidelying to sit: Mod assist, HOB elevated Sit to supine: Min guard General bed mobility comments: Mod A to elevate trunk. Max cues to perform transfer and for technique, "i can't i can't" Use of rails for support.  Transfers Overall transfer level: Needs assistance Equipment used: 1 person hand held assist Transfers:  Sit to/from Stand, Stand Pivot Transfers Sit to Stand: Mod assist Stand pivot transfers: Mod assist General transfer comment: Mod A to stand from EOB. Very unsteady with increased sway noted upon standing. Bil knee instability, L>R. SPT bed to/from chair x2 Mod A with cues for technique and therapist blocking LLE. Ambulation/Gait General Gait Details: Deferred due to safety concerns.    ADL:    Cognition: Cognition Overall Cognitive Status: Within Functional Limits for tasks assessed Orientation Level: Oriented X4 Cognition Arousal/Alertness: Awake/alert Behavior During Therapy: WFL for tasks assessed/performed Overall Cognitive Status: Within Functional Limits for tasks assessed  Blood pressure 146/74, pulse 46, temperature 98.1 F (36.7 C), temperature source Oral, resp. rate 18, height  (1.499 m), weight 52.617 kg (116 lb), SpO2 98 %. Physical Exam  Nursing note and vitals reviewed. Constitutional: She appears well-developed and well-nourished.  HENT:  Head: Normocephalic and atraumatic.  Eyes: Conjunctivae are normal. Pupils are equal, round, and reactive to light.  Neck: Normal range of motion. Neck supple.  Cardiovascular: An irregular rhythm present. Bradycardia present.   Respiratory: Effort normal and breath sounds normal.  GI: Soft. Bowel sounds are normal. She exhibits no distension. There is no tenderness.  Musculoskeletal: She exhibits no edema or tenderness.  Neurological: She is alert.  Oriented to self, place, month, year, DOB/age. Had a hard time accepting/realizing that she has had a stroke. Able to follow 1 and 2 step commands without difficulty. Speech clear. Mild left facial weakness. Left hemiparesis with ataxia and sensory deficits.  LUE 4/5 prox to distal. LLE: 2/5hf, 3/5 ke and 3+ to 4 adf/apf. Sensory 1+/2 left face, 1+/2 fingers/left hand, 1/2 prox left leg trace distal leg and foot  Skin: Skin is warm and dry.    Results for orders placed or  performed during the hospital encounter of 07/01/14 (from the past 24 hour(s))  Basic metabolic panel     Status: Abnormal   Collection Time: 07/02/14  9:32 AM  Result Value Ref Range   Sodium 139 135 - 145 mmol/L   Potassium 3.9 3.5 - 5.1 mmol/L   Chloride 107 96 - 112 mmol/L   CO2 26 19 - 32 mmol/L   Glucose, Bld 80 70 - 99 mg/dL   BUN 20 6 - 23 mg/dL   Creatinine, Ser 0.45 (H) 0.50 - 1.10 mg/dL   Calcium 9.3 8.4 - 40.9 mg/dL   GFR calc non Af Amer 42 (L) >90 mL/min   GFR calc Af Amer 49 (L) >90 mL/min   Anion gap 6 5 - 15  CBC     Status: None   Collection Time: 07/03/14  5:20 AM  Result Value Ref Range   WBC 5.4 4.0 - 10.5 K/uL   RBC 4.23 3.87 - 5.11 MIL/uL   Hemoglobin 12.5 12.0 - 15.0 g/dL   HCT 81.1 91.4 - 78.2 %   MCV 92.0 78.0 - 100.0 fL   MCH 29.6 26.0 - 34.0 pg   MCHC 32.1 30.0 - 36.0 g/dL   RDW 95.6 21.3 - 08.6 %   Platelets 307 150 - 400 K/uL  Basic metabolic panel     Status: Abnormal   Collection Time: 07/03/14  5:20 AM  Result Value Ref Range   Sodium 141 135 - 145 mmol/L   Potassium 4.2 3.5 - 5.1 mmol/L   Chloride 112 96 - 112 mmol/L   CO2 23 19 - 32 mmol/L   Glucose, Bld 103 (H) 70 - 99 mg/dL   BUN 16 6 - 23 mg/dL   Creatinine, Ser 1.61 0.50 - 1.10 mg/dL   Calcium 8.9 8.4 - 09.6 mg/dL   GFR calc non Af Amer 53 (L) >90 mL/min   GFR calc Af Amer 62 (L) >90 mL/min   Anion gap 6 5 - 15   Ct Head (brain) Wo Contrast  07/01/2014   CLINICAL DATA:  Left-sided numbness and weakness since yesterday.  EXAM: CT HEAD WITHOUT CONTRAST  TECHNIQUE: Contiguous axial images were obtained from the base of the skull through the vertex without intravenous contrast.  COMPARISON:  None.  FINDINGS: There is no evidence of mass effect, midline shift, or extra-axial fluid collections. There is no evidence of a space-occupying lesion or intracranial hemorrhage. There is no evidence of a cortical-based area of acute infarction. There are bilateral old parieto-occipital lobe infarcts.  There is an old left cerebellar infarct. There is a small left lacunar sub insular infarct. There is generalized cerebral atrophy. There is periventricular white matter low attenuation likely secondary to microangiopathy.  The ventricles and sulci are appropriate for the patient's age. The basal cisterns are patent.  Visualized portions of the orbits are unremarkable. The visualized portions of the paranasal sinuses and mastoid air cells are unremarkable. Cerebrovascular atherosclerotic calcifications are noted.  The osseous structures are unremarkable.  IMPRESSION: 1. No acute intracranial pathology. 2. Chronic microvascular disease and cerebral atrophy. 3. Bilateral old parieto-occipital lobe infarcts and left cerebellar infarct.   Electronically Signed   By: Elige Ko   On: 07/01/2014 11:11   Mr Brain Wo Contrast  07/01/2014   CLINICAL DATA:  Left-sided weakness and numbness.  Acute onset.  EXAM: MRI HEAD WITHOUT CONTRAST  MRA HEAD WITHOUT CONTRAST  TECHNIQUE: Multiplanar, multiecho pulse sequences of the brain and surrounding structures were obtained without intravenous contrast. Angiographic images of the head were obtained using MRA technique without contrast.  COMPARISON:  CT scan same day.  FINDINGS: MRI HEAD FINDINGS  The study suffers from motion degradation. There is a 1 cm acute infarction in the right lateral thalamus. No evidence of hemorrhage or mass effect. Elsewhere, there are old cerebellar infarctions an old bilateral occipital infarctions. There chronic small vessel infarctions within the thalami, basal ganglia and hemispheric deep white matter.  MRA HEAD FINDINGS  Very motion limited. Both internal carotid arteries show antegrade flow. Flow is present at least proximally within the anterior and middle cerebral arteries. No antegrade flow seen in the right vertebral artery. Left vertebral artery is patent to the basilar. Flow is present in both PCAs proximally.  IMPRESSION: Severely motion  degraded exam. 1 cm acute infarction in the right lateral thalamus.  Extensive old ischemic changes elsewhere throughout the brain as outlined above.  Motion degraded MR angiogram. Major vessels all show flow except for the right vertebral artery.   Electronically Signed   By: Paulina Fusi M.D.   On: 07/01/2014 20:57   Dg Chest Port 1 View  07/01/2014   CLINICAL DATA:  Bradycardia  EXAM: PORTABLE CHEST - 1 VIEW  COMPARISON:  None.  FINDINGS: There are scattered calcified granulomas  throughout the lungs bilaterally. There is no edema or consolidation. Heart is mildly enlarged with pulmonary vascularity within normal limits. No adenopathy. There is atherosclerotic change in aorta. There is sclerosis in the proximal right humerus.  IMPRESSION: Multiple granulomas. No edema or consolidation. Mild cardiomegaly. Incomplete visualization of sclerosis in the right proximal humerus. This area may represent bone infarct. It may be prudent to consider dedicated right humerus radiographs to further evaluate this region.   Electronically Signed   By: Bretta Bang III M.D.   On: 07/01/2014 12:13   Mr Maxine Glenn Head/brain Wo Cm  07/01/2014   CLINICAL DATA:  Left-sided weakness and numbness.  Acute onset.  EXAM: MRI HEAD WITHOUT CONTRAST  MRA HEAD WITHOUT CONTRAST  TECHNIQUE: Multiplanar, multiecho pulse sequences of the brain and surrounding structures were obtained without intravenous contrast. Angiographic images of the head were obtained using MRA technique without contrast.  COMPARISON:  CT scan same day.  FINDINGS: MRI HEAD FINDINGS  The study suffers from motion degradation. There is a 1 cm acute infarction in the right lateral thalamus. No evidence of hemorrhage or mass effect. Elsewhere, there are old cerebellar infarctions an old bilateral occipital infarctions. There chronic small vessel infarctions within the thalami, basal ganglia and hemispheric deep white matter.  MRA HEAD FINDINGS  Very motion limited. Both  internal carotid arteries show antegrade flow. Flow is present at least proximally within the anterior and middle cerebral arteries. No antegrade flow seen in the right vertebral artery. Left vertebral artery is patent to the basilar. Flow is present in both PCAs proximally.  IMPRESSION: Severely motion degraded exam. 1 cm acute infarction in the right lateral thalamus.  Extensive old ischemic changes elsewhere throughout the brain as outlined above.  Motion degraded MR angiogram. Major vessels all show flow except for the right vertebral artery.   Electronically Signed   By: Paulina Fusi M.D.   On: 07/01/2014 20:57   Dg Hip Unilat With Pelvis 2-3 Views Left  07/02/2014   CLINICAL DATA:  81 year old female with 2 day history of left-sided hip pain.  EXAM: LEFT HIP (WITH PELVIS) 2-3 VIEWS  COMPARISON:  No priors.  FINDINGS: AP view of the bony pelvis demonstrates no acute displaced fracture of the bony pelvic ring. Bilateral proximal femurs as visualized are intact, and the left femoral head is properly located. Mild degenerative changes of osteoarthritis in the hip joints bilaterally. Extensive atherosclerosis, with vascular stents noted in the right common iliac artery, and the left common and external iliac artery.  IMPRESSION: 1. No acute radiographic abnormality of the bony pelvis or the left hip. 2. Mild bilateral hip joint osteoarthritis.   Electronically Signed   By: Trudie Reed M.D.   On: 07/02/2014 19:24    Assessment/Plan: Diagnosis: right thalamic infarction with profound left hemisensory deficits (esp left leg) and left hemiparesis 1. Does the need for close, 24 hr/day medical supervision in concert with the patient's rehab needs make it unreasonable for this patient to be served in a less intensive setting? Yes 2. Co-Morbidities requiring supervision/potential complications: htn, post-stroke sequelae 3. Due to bladder management, bowel management, safety, skin/wound care, disease  management, medication administration, pain management and patient education, does the patient require 24 hr/day rehab nursing? Yes 4. Does the patient require coordinated care of a physician, rehab nurse, PT (1-2 hrs/day, 5 days/week), OT (1-2 hrs/day, 5 days/week) and SLP (1-2 hrs/day, 5 days/week) to address physical and functional deficits in the context of the above medical diagnosis(es)?  Yes Addressing deficits in the following areas: balance, endurance, locomotion, strength, transferring, bowel/bladder control, bathing, dressing, feeding, grooming, toileting and psychosocial support 5. Can the patient actively participate in an intensive therapy program of at least 3 hrs of therapy per day at least 5 days per week? Yes 6. The potential for patient to make measurable gains while on inpatient rehab is excellent 7. Anticipated functional outcomes upon discharge from inpatient rehab are supervision and min assist  with PT, supervision and min assist with OT, modified independent with SLP. 8. Estimated rehab length of stay to reach the above functional goals is: 11-16 days 9. Does the patient have adequate social supports and living environment to accommodate these discharge functional goals? Yes 10. Anticipated D/C setting: Home 11. Anticipated post D/C treatments: HH therapy and Outpatient therapy 12. Overall Rehab/Functional Prognosis: excellent  RECOMMENDATIONS: This patient's condition is appropriate for continued rehabilitative care in the following setting: CIR Patient has agreed to participate in recommended program. Yes Note that insurance prior authorization may be required for reimbursement for recommended care.  Comment: Rehab Admissions Coordinator to follow up.  Thanks,  Ranelle Oyster, MD, [place] [facility]     07/03/2014

## 2014-07-03 NOTE — Progress Notes (Signed)
Pt complaining of tingling in upper lip and left fingers similar to her symptoms on admission. Pt states slowly improving. Attending MD Blake DivineAkula made aware and no further orders given. MD stated not to repeat swallow screen. Stroke team paged. Will continue to monitor.

## 2014-07-03 NOTE — Progress Notes (Signed)
[facility] PROGRESS NOTE  Tracy [name] ZOX:[id number] DOB: 06-28-1926 DOA: 07/01/2014 PCP: No primary care provider on file. Interim summary: 81 year old lady admitted for left sided weakness and numbness. Her CT was negative for acute stroke. Neurology consulted and she was admitted for evaluation of CVA. MRI brain revealed right lateral thalamic infarct.   Assessment/Plan: #1 acute CVA: Right lateral thalamic infarct likely secondary to small vessel disease Patient had presented with left-sided numbness and left lower extremity weakness. MRI of the head consistent with right thalamic infarct. MRA right vertebral artery with outflow otherwise no hemodynamic significant stenosis. Carotid Dopplers from December 2015 with right ICA of 40-59% stenosis with increase in velocity. Patent left CEA site without restenosis. 2-D echo which was done with no source of emboli. Continue aspirin, Plavix, Trental for secondary stroke prevention. Risk factor modification. PT/OT evaluation , recommended CIR. Inpatient rehab consult ordered and pending. .  #2 hypertension Sub optimal. Resume home meds.   #3 left hip pain Ordered plain films of the left hip which are negative for acute fracture.   #4 hyperlipidemia LDL of 67. Continue TriCor.  #5 elevated troponins  Likely secondary to demand ischemia. Cardiac enzymes trending down. Patient asymptomatic. 2-D echo with EF of 60-65%. Grade 1 diastolic dysfunction. Moderately increased left ventricular wall thickness. EKG unremarkable ont he day of admission, recommend check another EKG today. .   #6 bradycardia Patient with history of sick sinus syndrome and has refused pacer in the past. Norvasc on hold. She is currently asymptomatic from the bradycardia.  12 lead EKG ordered.   #7 prophylaxis Heparin for DVT prophylaxis.  Code Status: Full Family Communication: Updated family at bedside. Disposition Plan: CIR versus home with home  health   Consultants:  Neurology: Dr. Thad Ranger 07/01/2014  Procedures:  CT head 07/01/2014  MRI MRA head 07/01/2014  2-D echo 07/02/2014  Chest x-ray 07/01/2014  Antibiotics:  None  HPI/Subjective: Left finger numbness earlier this am lasted a few minutes only.  Currently denies any chest pain or any tingling or numbness of the fingers.   Objective: Filed Vitals:   07/03/14 0812  BP: 150/59  Pulse: 51  Temp:   Resp: 18   No intake or output data in the 24 hours ending 07/03/14 0925 Filed Weights   07/01/14 0958  Weight: 52.617 kg (116 lb)    Exam:   General:  NAD. Dysarthria  Cardiovascular: bradycardic. Regular rhythm.   Respiratory: CTAB  Abdomen: Soft, nontender, nondistended, positive bowel sounds.  Musculoskeletal: No clubbing cyanosis or edema.  Data Reviewed: Basic Metabolic Panel:  Recent Labs Lab 07/01/14 1020 07/01/14 1806 07/02/14 0932 07/03/14 0520  NA 142  --  139 141  K 4.6  --  3.9 4.2  CL 109  --  107 112  CO2 23  --  26 23  GLUCOSE 107*  --  80 103*  BUN 28*  --  20 16  CREATININE 1.31* 1.16* 1.15* 0.95  CALCIUM 10.2  --  9.3 8.9   Liver Function Tests:  Recent Labs Lab 07/01/14 1020  AST 36  ALT 13  ALKPHOS 28*  BILITOT 1.0  PROT 6.7  ALBUMIN 3.8   No results for input(s): LIPASE, AMYLASE in the last 168 hours. No results for input(s): AMMONIA in the last 168 hours. CBC:  Recent Labs Lab 07/01/14 1020 07/03/14 0520  WBC 6.7 5.4  NEUTROABS 4.7  --   HGB 13.1 12.5  HCT 39.7 38.9  MCV 90.8  92.0  PLT 346 307   Cardiac Enzymes:  Recent Labs Lab 07/01/14 1020 07/01/14 1806 07/01/14 2350 07/02/14 0230  TROPONINI 0.54* 0.53* 0.48* 0.36*   BNP (last 3 results) No results for input(s): BNP in the last 8760 hours.  ProBNP (last 3 results) No results for input(s): PROBNP in the last 8760 hours.  CBG:  Recent Labs Lab 07/01/14 1203 07/01/14 1322  GLUCAP 71 75    Recent Results (from the past  240 hour(s))  Urine culture     Status: None   Collection Time: 07/01/14  3:10 PM  Result Value Ref Range Status   Specimen Description URINE, CLEAN CATCH  Final   Special Requests NONE  Final   Colony Count   Final    30,000 COLONIES/ML Performed at [facility]    Culture   Final    Multiple bacterial morphotypes present, none predominant. Suggest appropriate recollection if clinically indicated. Performed at [facility]    Report Status 07/02/2014 FINAL  Final     Studies: Ct Head (brain) Wo Contrast  07/01/2014   CLINICAL DATA:  Left-sided numbness and weakness since yesterday.  EXAM: CT HEAD WITHOUT CONTRAST  TECHNIQUE: Contiguous axial images were obtained from the base of the skull through the vertex without intravenous contrast.  COMPARISON:  None.  FINDINGS: There is no evidence of mass effect, midline shift, or extra-axial fluid collections. There is no evidence of a space-occupying lesion or intracranial hemorrhage. There is no evidence of a cortical-based area of acute infarction. There are bilateral old parieto-occipital lobe infarcts. There is an old left cerebellar infarct. There is a small left lacunar sub insular infarct. There is generalized cerebral atrophy. There is periventricular white matter low attenuation likely secondary to microangiopathy.  The ventricles and sulci are appropriate for the patient's age. The basal cisterns are patent.  Visualized portions of the orbits are unremarkable. The visualized portions of the paranasal sinuses and mastoid air cells are unremarkable. Cerebrovascular atherosclerotic calcifications are noted.  The osseous structures are unremarkable.  IMPRESSION: 1. No acute intracranial pathology. 2. Chronic microvascular disease and cerebral atrophy. 3. Bilateral old parieto-occipital lobe infarcts and left cerebellar infarct.   Electronically Signed   By: Elige Ko   On: 07/01/2014 11:11   Mr Brain Wo Contrast  07/01/2014    CLINICAL DATA:  Left-sided weakness and numbness.  Acute onset.  EXAM: MRI HEAD WITHOUT CONTRAST  MRA HEAD WITHOUT CONTRAST  TECHNIQUE: Multiplanar, multiecho pulse sequences of the brain and surrounding structures were obtained without intravenous contrast. Angiographic images of the head were obtained using MRA technique without contrast.  COMPARISON:  CT scan same day.  FINDINGS: MRI HEAD FINDINGS  The study suffers from motion degradation. There is a 1 cm acute infarction in the right lateral thalamus. No evidence of hemorrhage or mass effect. Elsewhere, there are old cerebellar infarctions an old bilateral occipital infarctions. There chronic small vessel infarctions within the thalami, basal ganglia and hemispheric deep white matter.  MRA HEAD FINDINGS  Very motion limited. Both internal carotid arteries show antegrade flow. Flow is present at least proximally within the anterior and middle cerebral arteries. No antegrade flow seen in the right vertebral artery. Left vertebral artery is patent to the basilar. Flow is present in both PCAs proximally.  IMPRESSION: Severely motion degraded exam. 1 cm acute infarction in the right lateral thalamus.  Extensive old ischemic changes elsewhere throughout the brain as outlined above.  Motion degraded MR angiogram. Major  vessels all show flow except for the right vertebral artery.   Electronically Signed   By: Paulina Fusi M.D.   On: 07/01/2014 20:57   Dg Chest Port 1 View  07/01/2014   CLINICAL DATA:  Bradycardia  EXAM: PORTABLE CHEST - 1 VIEW  COMPARISON:  None.  FINDINGS: There are scattered calcified granulomas throughout the lungs bilaterally. There is no edema or consolidation. Heart is mildly enlarged with pulmonary vascularity within normal limits. No adenopathy. There is atherosclerotic change in aorta. There is sclerosis in the proximal right humerus.  IMPRESSION: Multiple granulomas. No edema or consolidation. Mild cardiomegaly. Incomplete visualization of  sclerosis in the right proximal humerus. This area may represent bone infarct. It may be prudent to consider dedicated right humerus radiographs to further evaluate this region.   Electronically Signed   By: Bretta Bang III M.D.   On: 07/01/2014 12:13   Mr Maxine Glenn Head/brain Wo Cm  07/01/2014   CLINICAL DATA:  Left-sided weakness and numbness.  Acute onset.  EXAM: MRI HEAD WITHOUT CONTRAST  MRA HEAD WITHOUT CONTRAST  TECHNIQUE: Multiplanar, multiecho pulse sequences of the brain and surrounding structures were obtained without intravenous contrast. Angiographic images of the head were obtained using MRA technique without contrast.  COMPARISON:  CT scan same day.  FINDINGS: MRI HEAD FINDINGS  The study suffers from motion degradation. There is a 1 cm acute infarction in the right lateral thalamus. No evidence of hemorrhage or mass effect. Elsewhere, there are old cerebellar infarctions an old bilateral occipital infarctions. There chronic small vessel infarctions within the thalami, basal ganglia and hemispheric deep white matter.  MRA HEAD FINDINGS  Very motion limited. Both internal carotid arteries show antegrade flow. Flow is present at least proximally within the anterior and middle cerebral arteries. No antegrade flow seen in the right vertebral artery. Left vertebral artery is patent to the basilar. Flow is present in both PCAs proximally.  IMPRESSION: Severely motion degraded exam. 1 cm acute infarction in the right lateral thalamus.  Extensive old ischemic changes elsewhere throughout the brain as outlined above.  Motion degraded MR angiogram. Major vessels all show flow except for the right vertebral artery.   Electronically Signed   By: Paulina Fusi M.D.   On: 07/01/2014 20:57   Dg Hip Unilat With Pelvis 2-3 Views Left  07/02/2014   CLINICAL DATA:  81 year old female with 2 day history of left-sided hip pain.  EXAM: LEFT HIP (WITH PELVIS) 2-3 VIEWS  COMPARISON:  No priors.  FINDINGS: AP view of the  bony pelvis demonstrates no acute displaced fracture of the bony pelvic ring. Bilateral proximal femurs as visualized are intact, and the left femoral head is properly located. Mild degenerative changes of osteoarthritis in the hip joints bilaterally. Extensive atherosclerosis, with vascular stents noted in the right common iliac artery, and the left common and external iliac artery.  IMPRESSION: 1. No acute radiographic abnormality of the bony pelvis or the left hip. 2. Mild bilateral hip joint osteoarthritis.   Electronically Signed   By: Trudie Reed M.D.   On: 07/02/2014 19:24    Scheduled Meds: . aspirin EC  81 mg Oral Daily  . clopidogrel  75 mg Oral Daily  . fenofibrate  160 mg Oral Daily  . heparin  5,000 Units Subcutaneous 3 times per day  . latanoprost  1 drop Both Eyes QHS  . pentoxifylline  400 mg Oral Daily  . polyethylene glycol  17 g Oral Daily   Continuous Infusions: .  sodium chloride 0.9 % 1,000 mL infusion 75 mL/hr at 07/02/14 1038    Principal Problem:   CVA (cerebral infarction) Active Problems:   Bradycardia   Elevated troponin   Dysuria   Constipation   Stroke   Hip pain, acute   Essential hypertension    Time spent: 25 minutes    Hagop Mccollam M.D. [facility] Pager [phone]. If 7PM-7AM, please contact night-coverage at [email or web address], password [email or web address] 07/03/2014, 9:25 AM  LOS: 2 days

## 2014-07-03 NOTE — Progress Notes (Signed)
Per central telemetry, patient having junctional beats. This was also noted by cardiologist on the previous shift. Patient denies chest pain, no shortness of breath, resting quietly @ this time.

## 2014-07-03 NOTE — Progress Notes (Signed)
Physical Therapy Treatment Patient Details Name: Tracy Tracy [name] MRN: [id number] DOB: 30-Jun-1926 Today's Date: 07/03/2014    History of Present Illness Patient is Tracy [name] 81 y/o female who presents to the ER with left sided weakness and numbness s/p fall in bathroom bruising left knee. PMH of HTN and CVA. In the ED, pt found to have elevated troponin of 0.54,  bradycardia between 45 and 52, creatinine mildly elevated at 1.3. CT head (-). MRI + right lateral thalamus infarct.    PT Comments    Patient progressing well with mobility. Tolerated 2 person assist for gait training secondary to poor trunk control and instability of LLE. Fatigues quickly requiring rest breaks during ambulation bouts. Pt highly motivated to maximize independence. Seems anxious at times. Continues to be appropriate for CIR. Will follow and progress as tolerated.   Follow Up Recommendations  CIR     Equipment Recommendations  Other (comment)    Recommendations for Other Services       Precautions / Restrictions Precautions Precautions: Fall Restrictions Weight Bearing Restrictions: No    Mobility  Bed Mobility Overal bed mobility: Needs Assistance Bed Mobility: Rolling;Sidelying to Sit Rolling: Min guard Sidelying to sit: Mod assist       General bed mobility comments: Mod Tracy [name] to elevate trunk. Max cues to perform transfer and for technique;"grab my arm and help me up." Use of rails for support.   Transfers Overall transfer level: Needs assistance Equipment used: 1 person hand held assist Transfers: Sit to/from [employer] Sit to Stand: Min assist Stand pivot transfers: Mod assist       General transfer comment: SPT bed to chair with cues for hand placement and therapist blocking LLE. Truncal sway noted upon standing with increased hip flexion. Min Tracy [name] to stand from chair x3 with cues for anterior translation and hand placement with therapist blocking LLE and providing cues for upright  posture/hip extension.  Ambulation/Gait Ambulation/Gait assistance: Mod assist;+2 physical assistance Ambulation Distance (Feet): 40 Feet (x2 bouts) Assistive device: 1 person hand held assist Gait Pattern/deviations: Step-through pattern;Decreased stride length;Decreased step length - left;Decreased step length - right;Decreased weight shift to left;Narrow base of support   Gait velocity interpretation: Below normal speed for age/gender General Gait Details: Manual/verbal cues for hip extension, upright posture and wide bOS. Assist to advance LLE and for left knee stability as pt with left knee buckling without therapist blocking left knee during stance phase of gait. Fatigues quickly.  2 seated rest breaks.   Stairs            Wheelchair Mobility    Modified Rankin (Stroke Patients Only) Modified Rankin (Stroke Patients Only) Pre-Morbid Rankin Score: No symptoms Modified Rankin: Moderately severe disability     Balance Overall balance assessment: Needs assistance Sitting-balance support: Feet supported;No upper extremity supported Sitting balance-Leahy Scale: Fair     Standing balance support: During functional activity Standing balance-Leahy Scale: Poor Standing balance comment: Requires external support for balance in standing with Mod manual cues for hip extension and thoracic/lumbar extension. Increased sway noted ins tanding with left knee buckling without support.                     Cognition Arousal/Alertness: Awake/alert Behavior During Therapy: Anxious Overall Cognitive Status: Within Functional Limits for tasks assessed                      Exercises      General Comments  Pertinent Vitals/Pain Pain Assessment: No/denies pain    Home Living                      Prior Function            PT Goals (current goals can now be found in the care plan section) Progress towards PT goals: Progressing toward goals     Frequency  Min 4X/week    PT Plan Current plan remains appropriate    Co-evaluation             End of Session Equipment Utilized During Treatment: Gait belt Activity Tolerance: Patient tolerated treatment well;Patient limited by fatigue Patient left: in chair;with call bell/phone within reach;with family/visitor present     Time: 1432-1500 PT Time Calculation (min) (ACUTE ONLY): 28 min  Charges:  $Gait Training: 8-22 mins $Therapeutic Activity: 8-22 mins                    G CodesAlvie Tracy [name]:      Tracy [name], Tracy Tracy [name] 07/03/2014, 3:36 PM Tracy HeidelbergShauna Tracy [name], PT, DPT [phone]

## 2014-07-03 NOTE — Progress Notes (Signed)
STROKE TEAM PROGRESS NOTE   HISTORY Tracy [name] is an 81 y.o. female with a history of a mini-stroke many years ago who presents with complaints of left sided numbness. Patient reports that on yesterday noted numbness in her left leg. Then noted numbness in the last two fingers of her left hand and into her arm. Then whole hand eventually became numb. Patient also has an area of numbness on the left side of her mouth. Symptoms persisted overnight and this morning she was unable to ambulate without assistance. Patient presented for follow up at that time.She was last known well 06/30/2014 at 18:00. Patient was not administered TPA secondary to Outside time window. She was admitted for further evaluation and treatment.   SUBJECTIVE (INTERVAL HISTORY) No one is at the bedside.  Patient states her "leg is dead" and "I am tired of this bed". Patient with recurrent R hand/arm numbness and tingling. Complains of left thigh pain.    OBJECTIVE Temp:  [97.7 F (36.5 C)-98.4 F (36.9 C)] 98.3 F (36.8 C) (03/30 0925) Pulse Rate:  [41-63] 48 (03/30 0925) Cardiac Rhythm:  [-] Normal sinus rhythm;Sinus bradycardia (03/29 2016) Resp:  [16-18] 16 (03/30 0925) BP: (126-167)/(47-96) 150/59 mmHg (03/30 0812) SpO2:  [98 %-100 %] 98 % (03/30 0925)   Recent Labs Lab 07/01/14 1203 07/01/14 1322  GLUCAP 71 75    Recent Labs Lab 07/01/14 1020 07/01/14 1806 07/02/14 0932 07/03/14 0520  NA 142  --  139 141  K 4.6  --  3.9 4.2  CL 109  --  107 112  CO2 23  --  26 23  GLUCOSE 107*  --  80 103*  BUN 28*  --  20 16  CREATININE 1.31* 1.16* 1.15* 0.95  CALCIUM 10.2  --  9.3 8.9    Recent Labs Lab 07/01/14 1020  AST 36  ALT 13  ALKPHOS 28*  BILITOT 1.0  PROT 6.7  ALBUMIN 3.8    Recent Labs Lab 07/01/14 1020 07/03/14 0520  WBC 6.7 5.4  NEUTROABS 4.7  --   HGB 13.1 12.5  HCT 39.7 38.9  MCV 90.8 92.0  PLT 346 307    Recent Labs Lab 07/01/14 1020 07/01/14 1806  07/01/14 2350 07/02/14 0230  TROPONINI 0.54* 0.53* 0.48* 0.36*    Recent Labs  07/01/14 1020  LABPROT 15.1  INR 1.18    Recent Labs  07/01/14 1510  COLORURINE YELLOW  LABSPEC 1.015  PHURINE 7.5  GLUCOSEU NEGATIVE  HGBUR NEGATIVE  BILIRUBINUR NEGATIVE  KETONESUR NEGATIVE  PROTEINUR NEGATIVE  UROBILINOGEN 0.2  NITRITE NEGATIVE  LEUKOCYTESUR TRACE*       Component Value Date/Time   CHOL 130 07/02/2014 0230   TRIG 114 07/02/2014 0230   HDL 40 07/02/2014 0230   CHOLHDL 3.3 07/02/2014 0230   VLDL 23 07/02/2014 0230   LDLCALC 67 07/02/2014 0230   Lab Results  Component Value Date   HGBA1C 6.3* 07/02/2014   No results found for: LABOPIA, COCAINSCRNUR, LABBENZ, AMPHETMU, THCU, LABBARB  No results for input(s): ETH in the last 168 hours.  I have personally reviewed the radiological images below and agree with the radiology interpretations.  Ct Head (brain) Wo Contrast 07/01/2014    1. No acute intracranial pathology. 2. Chronic microvascular disease and cerebral atrophy. 3. Bilateral old parieto-occipital lobe infarcts and left cerebellar infarct.     Mr Brain Wo Contrast 07/01/2014    Severely motion degraded exam. 1 cm acute infarction in the right lateral thalamus.  Extensive old ischemic changes elsewhere throughout the brain.    Dg Chest Port 1 View 07/01/2014   Multiple granulomas. No edema or consolidation. Mild cardiomegaly. Incomplete visualization of sclerosis in the right proximal humerus. This area may represent bone infarct. It may be prudent to consider dedicated right humerus radiographs to further evaluate this region.     Mr Maxine GlennMra Head/brain Wo Cm 07/01/2014   Motion degraded MR angiogram. Major vessels all show flow except for the right vertebral artery.     2D Echocardiogram  - LVEF 60-65%, moderately increased LV wall thickness, diastolic dysfunction, indeterminate LV filling pressure, mild MR, moderateRAE, normal LA size.  Carotid Doppler - Bilateral:  1-39% ICA stenosis. Vertebral artery flow is antegrade.   PHYSICAL EXAM Temp:  [97.7 F (36.5 C)-98.4 F (36.9 C)] 98.3 F (36.8 C) (03/30 0925) Pulse Rate:  [41-63] 48 (03/30 0925) Resp:  [16-18] 16 (03/30 0925) BP: (126-167)/(47-96) 150/59 mmHg (03/30 0812) SpO2:  [98 %-100 %] 98 % (03/30 0925)  General - thin built, well developed, in no apparent distress.  Ophthalmologic - fundi not visualized due to eye movement.  Cardiovascular - bradycardia, regular rhythm but with intermittent premature beat.  Mental Status -  Level of arousal and orientation to time, place, and person were intact. Language including expression, naming, repetition, comprehension was assessed and found intact.  Cranial Nerves II - XII - II - Visual field intact OU. III, IV, VI - Extraocular movements intact. V - Facial sensation mildly decreased around left upper lip. VII - Facial movement intact bilaterally. VIII - Hearing & vestibular intact bilaterally. X - Palate elevates symmetrically. XI - Chin turning & shoulder shrug intact bilaterally. XII - Tongue protrusion intact.  Motor Strength - The patient's strength was normal in all extremities except LLE 4+/5 distally and pronator drift was absent.  Bulk was normal and fasciculations were absent.   Motor Tone - Muscle tone was assessed at the neck and appendages and was normal.  Reflexes - The patient's reflexes were 1+ in all extremities and she had no pathological reflexes.  Sensory - Light touch, temperature/pinprick were mildly decreased on the left hand and left foot.    Coordination - The patient had normal movements in the hands and feet with no ataxia or dysmetria.  Tremor was absent.  Gait and Station - not tested due to safety concerns.   ASSESSMENT/PLAN Tracy [name] is a 81 y.o. female with history of history of hypertension, sick sinus syndrome, bradycardia, carotid stenosis, PVD, and stroke presenting with left sided  weakness and numbness. She did not receive IV t-PA due to delay in arrival.   Stroke:  right lateral thalamic infarct, likely  secondary to small vessel disease source  No known hx of atrial fibrillation - while it is in problem list, it is not recorded in any cardiology or IM notes prior to this admission  Resultant  L hemisensory deficit, LLE hemiparesis. Pt with recurrent L arm numbness, tingling this am, associated with known stroke  MRI  R lateral thalamic infarct  MRA  R VA without flow, o/w no hemodynamically significant stenosis  Carotid Doppler unremarkable  2D Echo  No source of embolus   HgbA1c 6.3  Heparin 5000 units sq tid for VTE prophylaxis Diet Heart Room service appropriate?: Yes; Fluid consistency:: Thin  aspirin 81 mg orally every day, clopidogrel 75 mg orally every day and tretnal prior to admission, now on aspirin 81 mg orally every day, clopidogrel 75  mg orally every day and trental   Due to lack of risk factor and failed ASA and plavix dual antiplatelet treatment, with ? Afib diagnosis on the problem list, will recommend 30 day cardiac event monitoring as outpt to rule out afib. Can be arranged with cardiology.  Pt has been seen by cardiology and also recommend loop recorder vs. 30 day monitoring. Either way is fine with [place].  Patient and family counseled to be compliant with her antithrombotic medications  Ongoing aggressive stroke risk factor management  Therapy recommendations:  CIR. Consult agrees  Disposition:  CIR, rehab admissions coordinator following  Hypertension  Home meds:   Amlodipine-benazepril  Not on BP meds currently  Elevated. BP 126-196/47-72 past 24h (07/03/2014 @ 2:13 PM)   Ok to resume home meds from stroke neuro standpoint  Hyperlipidemia  Home meds:  Fenofibrate, resumed in hospital  LDL 67, at goal < 70  Continue fenofibrate.   Agree with cardiology to add statin long-term (ordered lipitor )  Other Stroke Risk  Factors  Advanced age  Hx stroke - 2002 with sx of loss of short term memory, "kept repeating things" per family. No documentation of event in EPIC.  Other Active Problems  Elevated troponins  Bradycardia. Hx sick sinus syndrome. Refused pacer in past  Dysuria. Cx Pending.  Elevated creatinine.  Hospital day # 2  Recommend OP tele monitoring vs. loop recorder placed inpatient. Dr. Roda Shutters has discussed with Dr. Blake Divine  Patient has a 10-15% risk of having another stroke over the next year, the highest risk is within 2 weeks of the most recent stroke/TIA (risk of having a stroke following a stroke or TIA is the same).  Ongoing risk factor control by Primary Care Physician  Stroke Service will sign off. Please call should any needs arise.  Follow-up Stroke Clinic at [facility] with Dr. Marvel Plan in 2 months, order placed.    Rhoderick Moody [facility] See Amion for Pager information 07/03/2014 10:23 AM  I, the attending vascular neurologist, have personally obtained a history, examined the patient, evaluated laboratory data, individually viewed imaging studies and agree with radiology interpretations. I also discussed with Dr. Suezanne Cheshire regarding her care plan. Together with the NP/PA, we formulated the assessment and plan of care which reflects our mutual decision.  I have made any additions or clarifications directly to the above note and agree with the findings and plan as currently documented.   81 yo F with hx of HTN, HLD, bradycardia, ?stroke in 2002 was admitted for right thalamic infarct. Stroke work up so far unremarkable. LDL 67 and A1C 6.3. No definite diagnosis of Afib although it was on the problem list. Real stroke risk factor is advanced age. She is already on ASA and plavix at home. Due to lack of risk factor and failed ASA and plavix dural antiplatelet treatment, with ? Afib diagnosis on the problem list, will recommend cardiac monitoring to rule  out A. fib. Agree with cardiology to consider 30 day cardiac event monitoring vs. loop recorder. Also agree with cardiology for statin long-term. Added Lipitor 10 mg.  Neurology will sign off. Please call with questions. Pt will follow up with Dr. Roda Shutters at [facility] in about 2 months. Thanks for the consult.  Marvel Plan, MD PhD Stroke Neurology 07/03/2014 5:16 PM        To contact Stroke Continuity provider, please refer to [email or web address]. After hours, contact General Neurology

## 2014-07-03 NOTE — Progress Notes (Signed)
UR complete.  Misty Rago RN, MSN 

## 2014-07-04 ENCOUNTER — Encounter: Payer: Self-pay | Admitting: Internal Medicine

## 2014-07-04 ENCOUNTER — Inpatient Hospital Stay (HOSPITAL_COMMUNITY)
Admission: RE | Admit: 2014-07-04 | Discharge: 2014-07-18 | DRG: 057 | Disposition: A | Discharge status: Home Health | Payer: Medicare Other | Source: Intra-hospital | Attending: Physical Medicine & Rehabilitation | Admitting: Physical Medicine & Rehabilitation

## 2014-07-04 ENCOUNTER — Telehealth: Payer: Self-pay | Admitting: *Deleted

## 2014-07-04 DIAGNOSIS — M160 Bilateral primary osteoarthritis of hip: Secondary | ICD-10-CM | POA: Diagnosis not present

## 2014-07-04 DIAGNOSIS — I69354 Hemiplegia and hemiparesis following cerebral infarction affecting left non-dominant side: Principal | ICD-10-CM

## 2014-07-04 DIAGNOSIS — Z86718 Personal history of other venous thrombosis and embolism: Secondary | ICD-10-CM | POA: Diagnosis not present

## 2014-07-04 DIAGNOSIS — E785 Hyperlipidemia, unspecified: Secondary | ICD-10-CM | POA: Diagnosis not present

## 2014-07-04 DIAGNOSIS — I69898 Other sequelae of other cerebrovascular disease: Secondary | ICD-10-CM | POA: Diagnosis not present

## 2014-07-04 DIAGNOSIS — I129 Hypertensive chronic kidney disease with stage 1 through stage 4 chronic kidney disease, or unspecified chronic kidney disease: Secondary | ICD-10-CM | POA: Diagnosis not present

## 2014-07-04 DIAGNOSIS — E119 Type 2 diabetes mellitus without complications: Secondary | ICD-10-CM | POA: Diagnosis not present

## 2014-07-04 DIAGNOSIS — I10 Essential (primary) hypertension: Secondary | ICD-10-CM | POA: Diagnosis not present

## 2014-07-04 DIAGNOSIS — G8190 Hemiplegia, unspecified affecting unspecified side: Secondary | ICD-10-CM

## 2014-07-04 DIAGNOSIS — K219 Gastro-esophageal reflux disease without esophagitis: Secondary | ICD-10-CM | POA: Diagnosis not present

## 2014-07-04 DIAGNOSIS — IMO0002 Reserved for concepts with insufficient information to code with codable children: Secondary | ICD-10-CM

## 2014-07-04 DIAGNOSIS — I6339 Cerebral infarction due to thrombosis of other cerebral artery: Secondary | ICD-10-CM

## 2014-07-04 DIAGNOSIS — R4189 Other symptoms and signs involving cognitive functions and awareness: Secondary | ICD-10-CM

## 2014-07-04 DIAGNOSIS — R208 Other disturbances of skin sensation: Secondary | ICD-10-CM | POA: Diagnosis not present

## 2014-07-04 DIAGNOSIS — I495 Sick sinus syndrome: Secondary | ICD-10-CM | POA: Diagnosis present

## 2014-07-04 DIAGNOSIS — I63312 Cerebral infarction due to thrombosis of left middle cerebral artery: Secondary | ICD-10-CM

## 2014-07-04 DIAGNOSIS — H9190 Unspecified hearing loss, unspecified ear: Secondary | ICD-10-CM | POA: Diagnosis not present

## 2014-07-04 DIAGNOSIS — I69398 Other sequelae of cerebral infarction: Secondary | ICD-10-CM | POA: Diagnosis not present

## 2014-07-04 DIAGNOSIS — N189 Chronic kidney disease, unspecified: Secondary | ICD-10-CM | POA: Diagnosis not present

## 2014-07-04 LAB — COMPREHENSIVE METABOLIC PANEL
ALK PHOS: 29 U/L — AB (ref 39–117)
ALT: 14 U/L (ref 0–35)
ANION GAP: 10 (ref 5–15)
AST: 30 U/L (ref 0–37)
Albumin: 3.3 g/dL — ABNORMAL LOW (ref 3.5–5.2)
BUN: 11 mg/dL (ref 6–23)
CALCIUM: 9.2 mg/dL (ref 8.4–10.5)
CO2: 21 mmol/L (ref 19–32)
Chloride: 109 mmol/L (ref 96–112)
Creatinine, Ser: 0.88 mg/dL (ref 0.50–1.10)
GFR, EST AFRICAN AMERICAN: 68 mL/min — AB (ref >90)
GFR, EST NON AFRICAN AMERICAN: 58 mL/min — AB (ref >90)
Glucose, Bld: 88 mg/dL (ref 70–99)
Potassium: 3.6 mmol/L (ref 3.5–5.1)
SODIUM: 140 mmol/L (ref 135–145)
Total Bilirubin: 0.6 mg/dL (ref 0.3–1.2)
Total Protein: 6.4 g/dL (ref 6.0–8.3)

## 2014-07-04 MED ORDER — HYDROCODONE-ACETAMINOPHEN 5-325 MG PO TABS: 1.0000 | ORAL_TABLET | Freq: Four times a day (QID) | ORAL | Status: DC | PRN

## 2014-07-04 MED ORDER — ASPIRIN EC 81 MG PO TBEC
81.0000 mg | DELAYED_RELEASE_TABLET | Freq: Every day | ORAL | Status: DC
  Administered 2014-07-05 – 2014-07-18 (×14): 81 mg via ORAL
  Filled 2014-07-04 (×16): qty 1

## 2014-07-04 MED ORDER — CLOPIDOGREL BISULFATE 75 MG PO TABS
75.0000 mg | ORAL_TABLET | Freq: Every day | ORAL | Status: DC
  Administered 2014-07-05 – 2014-07-18 (×14): 75 mg via ORAL
  Filled 2014-07-04 (×15): qty 1

## 2014-07-04 MED ORDER — BENAZEPRIL HCL 40 MG PO TABS
40.0000 mg | ORAL_TABLET | Freq: Every day | ORAL | Status: DC
  Administered 2014-07-05 – 2014-07-15 (×11): 40 mg via ORAL
  Filled 2014-07-04 (×14): qty 1

## 2014-07-04 MED ORDER — ATORVASTATIN CALCIUM 10 MG PO TABS: 10.0000 mg | ORAL_TABLET | Freq: Every day | ORAL | Status: DC

## 2014-07-04 MED ORDER — DIPHENHYDRAMINE HCL 12.5 MG/5ML PO ELIX: 12.5000 mg | ORAL_SOLUTION | Freq: Four times a day (QID) | ORAL | Status: DC | PRN

## 2014-07-04 MED ORDER — BISACODYL 10 MG RE SUPP: 10.0000 mg | Freq: Every day | RECTAL | Status: DC | PRN

## 2014-07-04 MED ORDER — ALUM & MAG HYDROXIDE-SIMETH 200-200-20 MG/5ML PO SUSP: 30.0000 mL | ORAL | Status: DC | PRN

## 2014-07-04 MED ORDER — ATORVASTATIN CALCIUM 10 MG PO TABS
10.0000 mg | ORAL_TABLET | Freq: Every day | ORAL | Status: DC
  Administered 2014-07-04 – 2014-07-17 (×14): 10 mg via ORAL
  Filled 2014-07-04 (×15): qty 1

## 2014-07-04 MED ORDER — PROCHLORPERAZINE 25 MG RE SUPP: 12.5000 mg | Freq: Four times a day (QID) | RECTAL | Status: DC | PRN

## 2014-07-04 MED ORDER — POLYETHYLENE GLYCOL 3350 17 G PO PACK
17.0000 g | PACK | Freq: Every day | ORAL | Status: DC
Start: 2014-07-05 — End: 2014-07-18
  Administered 2014-07-08 – 2014-07-09 (×2): 17 g via ORAL
  Filled 2014-07-04 (×15): qty 1

## 2014-07-04 MED ORDER — TRAZODONE HCL 50 MG PO TABS: 25.0000 mg | ORAL_TABLET | Freq: Every evening | ORAL | Status: DC | PRN

## 2014-07-04 MED ORDER — BENAZEPRIL HCL 20 MG PO TABS
40.0000 mg | ORAL_TABLET | Freq: Every day | ORAL | Status: DC
  Administered 2014-07-04: 40 mg via ORAL
  Filled 2014-07-04: qty 2

## 2014-07-04 MED ORDER — ENOXAPARIN SODIUM 30 MG/0.3ML ~~LOC~~ SOLN
30.0000 mg | SUBCUTANEOUS | Status: DC
  Administered 2014-07-04 – 2014-07-17 (×14): 30 mg via SUBCUTANEOUS
  Filled 2014-07-04 (×15): qty 0.3

## 2014-07-04 MED ORDER — PENTOXIFYLLINE ER 400 MG PO TBCR
400.0000 mg | EXTENDED_RELEASE_TABLET | Freq: Every day | ORAL | Status: DC
  Administered 2014-07-05 – 2014-07-18 (×14): 400 mg via ORAL
  Filled 2014-07-04 (×15): qty 1

## 2014-07-04 MED ORDER — BENAZEPRIL HCL 40 MG PO TABS: 40.0000 mg | ORAL_TABLET | Freq: Every day | ORAL | Status: DC

## 2014-07-04 MED ORDER — HYDROCODONE-ACETAMINOPHEN 5-325 MG PO TABS
1.0000 | ORAL_TABLET | Freq: Four times a day (QID) | ORAL | Status: DC | PRN
  Filled 2014-07-04: qty 1

## 2014-07-04 MED ORDER — LATANOPROST 0.005 % OP SOLN
1.0000 [drp] | Freq: Every day | OPHTHALMIC | Status: DC
  Administered 2014-07-04 – 2014-07-18 (×14): 1 [drp] via OPHTHALMIC

## 2014-07-04 MED ORDER — ENSURE PUDDING PO PUDG
1.0000 | Freq: Three times a day (TID) | ORAL | Status: DC
  Administered 2014-07-05 – 2014-07-17 (×24): 1 via ORAL

## 2014-07-04 MED ORDER — FLEET ENEMA 7-19 GM/118ML RE ENEM: 1.0000 | ENEMA | Freq: Once | RECTAL | Status: AC | PRN

## 2014-07-04 MED ORDER — PROCHLORPERAZINE MALEATE 5 MG PO TABS
5.0000 mg | ORAL_TABLET | Freq: Four times a day (QID) | ORAL | Status: DC | PRN
  Filled 2014-07-04: qty 2

## 2014-07-04 MED ORDER — PROCHLORPERAZINE EDISYLATE 5 MG/ML IJ SOLN: 5.0000 mg | Freq: Four times a day (QID) | INTRAMUSCULAR | Status: DC | PRN

## 2014-07-04 MED ORDER — FENOFIBRATE 160 MG PO TABS
160.0000 mg | ORAL_TABLET | Freq: Every day | ORAL | Status: DC
  Administered 2014-07-05 – 2014-07-18 (×14): 160 mg via ORAL
  Filled 2014-07-04 (×15): qty 1

## 2014-07-04 MED ORDER — GUAIFENESIN-DM 100-10 MG/5ML PO SYRP: 5.0000 mL | ORAL_SOLUTION | Freq: Four times a day (QID) | ORAL | Status: DC | PRN

## 2014-07-04 MED ORDER — ACETAMINOPHEN 325 MG PO TABS
325.0000 mg | ORAL_TABLET | ORAL | Status: DC | PRN
  Administered 2014-07-14: 650 mg via ORAL
  Filled 2014-07-04: qty 2

## 2014-07-04 NOTE — H&P (Signed)
Related encounter: ED to Hosp-Admission (Discharged) from 07/01/2014 in [place] Collapse All      Physical Medicine and Rehabilitation Admission H&P   Chief Complaint  Patient presents with  . Left sided weakness and numbness   HPI: Tracy [name] is a 81 y.o. 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 Family member insisted on bringing her to ED for evaluation and patient was found to be bradycardic with HR 40-50s. CT head showed chronic bilateral occipital and left cerebellar infarcts and negative for acute changes. MRI/MRA brain done revealing 1 cm acute infarction in the right lateral thalamus, extensive old ischemic changes throughout the brain and no antegrade flow in R-VA. 2D echo with EF 60-655 with mild mitral calcification and moderately dilated right atrium. Carotid dopplers without significant ICA stenosis. Pelvic films done due to complaints of left hip pain and revealed mild bilateral hip OA. Patient continues to have bouts of bradycardia but has refused PPM in the past. Dr. Erlinda Hong felt that patient had thrombotic stroke due to SVD and recommends compliance with antiplatelet medications (ASA, plavix, trental). Loop recorder not needed and no additional monitoring at this time per consult by Dr. Rayann Heman. Therapy evaluations done and CIR recommended by MD and Rehab team   Review of Systems  HENT: Positive for hearing loss.  Eyes: Negative for blurred vision and double vision.  Respiratory: Positive for shortness of breath. Negative for cough.  Cardiovascular: Negative for chest pain and palpitations.  Gastrointestinal: Negative for heartburn, nausea and constipation.  Genitourinary: Negative for urgency and frequency.    Musculoskeletal: Negative for myalgias.  Neurological: Positive for sensory change (sensation RLE improving today) and focal weakness. Negative for headaches.    HENT: Negative for hearing loss.  Eyes: Negative for blurred vision and double vision.  Respiratory: Negative for cough and shortness of breath.  Cardiovascular: Negative for chest pain and palpitations.  Gastrointestinal: Negative for heartburn and nausea.  Musculoskeletal: Negative for myalgias, back pain, joint pain and falls.  Neurological: Positive for speech change and focal weakness. Negative for dizziness, weakness and headaches.    Past Medical History  Diagnosis Date  . Stroke   . Hypertension     Past Surgical History  Procedure Laterality Date  . Carotid endarterectomy      Family History  Problem Relation Age of Onset  . Cancer Mother     colon    Social History: Widowed. Retired from [employer] order. Son lives with her (retired and can provide supervision?). She was independent and active without AD. She reports that she has never smoked. She does not have any smokeless tobacco history on file. She reports that she does not drink alcohol or use illicit drugs.    Allergies: No Known Allergies    Medications Prior to Admission  Medication Sig Dispense Refill  . amLODipine-benazepril (LOTREL) 10-40 MG per capsule Take 1 capsule by mouth daily.    Marland Kitchen aspirin EC 81 MG tablet Take 81 mg by mouth daily.    . clopidogrel (PLAVIX) 75 MG tablet Take 75 mg by mouth daily.    . fenofibrate micronized (LOFIBRA) 134 MG capsule Take 134 mg by mouth daily.    Marland Kitchen latanoprost (XALATAN) 0.005 % ophthalmic solution Place 1 drop into  both eyes daily.    . pentoxifylline (TRENTAL) 400 MG CR tablet Take 400 mg by mouth daily.    . [DISCONTINUED] HYDROcodone-acetaminophen (NORCO/VICODIN) 5-325 MG per tablet Take 1 tablet by mouth every 6  (six) hours as needed for moderate pain.      Home: Home Living Family/patient expects to be discharged to:: Private residence Living Arrangements: Children (son) Available Help at Discharge: Family, Available 24 hours/day Type of Home: House Home Access: Level entry Home Layout: One level Home Equipment: None  Functional History: Prior Function Level of Independence: Independent Comments: Pt drives.   Functional Status:  Mobility: Bed Mobility Overal bed mobility: Needs Assistance Bed Mobility: Rolling, Sidelying to Sit Rolling: Min guard Sidelying to sit: Mod assist Sit to supine: Min guard General bed mobility comments: Patient seated in recliner upon entering room, please see PT note for more information regarding bed mobility Transfers Overall transfer level: Needs assistance Equipment used: 1 person hand held assist Transfers: Sit to/from Stand, Stand Pivot Transfers Sit to Stand: Mod assist Stand pivot transfers: Mod assist General transfer comment: sit<>stand from recliner and stand pivot transfer recliner <> BSC. Cues needed for safety and technique. Left knee tended to buckle during weight bearing.  Ambulation/Gait Ambulation/Gait assistance: Mod assist, +2 physical assistance Ambulation Distance (Feet): 40 Feet (x2 bouts) Assistive device: 1 person hand held assist Gait Pattern/deviations: Step-through pattern, Decreased stride length, Decreased step length - left, Decreased step length - right, Decreased weight shift to left, Narrow base of support Gait velocity interpretation: Below normal speed for age/gender General Gait Details: Manual/verbal cues for hip extension, upright posture and wide bOS. Assist to advance LLE and for left knee stability as pt with left knee buckling without therapist blocking left knee during stance phase of gait. Fatigues quickly. 2 seated rest breaks.    ADL: ADL Overall ADL's : Needs assistance/impaired Eating/Feeding:  Minimal assistance, Sitting Eating/Feeding Details (indicate cue type and reason): using L hand as gross motor component  Grooming: Minimal assistance, Sitting Upper Body Bathing: Minimal assitance, Sitting Lower Body Bathing: Moderate assistance, Sit to/from stand, Cueing for safety Upper Body Dressing : Minimal assistance, Sitting Lower Body Dressing: Moderate assistance, Sit to/from stand, Cueing for safety Toilet Transfer: Moderate assistance, BSC, Stand-pivot Toileting- Clothing Manipulation and Hygiene: Minimal assistance, Cueing for safety, Sit to/from stand General ADL Comments: Patient able to reach BLEs for LB ADLs. Patient required mod assist for sit<>stands and functional transfers. Patient with decreased coordination and sensation > LUE and therapist encouraged patient to use eyes to compensate at this time. Also encouraged patient to functionally use LUE as much as possible to regain strengh and function.   Cognition: Cognition Overall Cognitive Status: Within Functional Limits for tasks assessed Orientation Level: Oriented X4 Cognition Arousal/Alertness: Awake/alert Behavior During Therapy: WFL for tasks assessed/performed Overall Cognitive Status: Within Functional Limits for tasks assessed   Blood pressure 163/57, pulse 57, temperature 98 F (36.7 C), temperature source Oral, resp. rate 20, height $RemoveBe'4\' 11"'nEppweCUD$  (1.499 m), weight 52.617 kg (116 lb), SpO2 99 %. Physical Exam  Nursing note and vitals reviewed. Constitutional: She is oriented to person, place, and time. She appears well-developed and well-nourished.  HENT:  Head: Normocephalic and atraumatic.  Eyes: Conjunctivae are normal. Pupils are equal, round, and reactive to light.  Neck: Normal range of motion. Neck supple.  Cardiovascular: Normal rate.  Respiratory: Effort normal and breath sounds normal. No respiratory distress. She has no wheezes.  GI: Soft. Bowel sounds are normal. She exhibits  no distension. There  is no tenderness.  Musculoskeletal: She exhibits no edema or tenderness.  Neurological: She is alert and oriented to person, place, and time.  Able to follow 1 and 2 step commands without difficulty. Speech generally clear. Mild left facial weakness. Left hemiparesis with ataxia and sensory deficits. LUE 4/5 prox to distal. LLE: 2/5hf, 3/5 ke and 3+ to 4 adf/apf. Sensory 1+/2 left face, 1+/2 fingers/left hand, 1+/2 prox left leg 1/2 distal leg and foot  Skin: Skin is warm and dry.  Psych: pleasant, cooperative     Lab Results Last 48 Hours    Results for orders placed or performed during the hospital encounter of 07/01/14 (from the past 48 hour(s))  CBC Status: None   Collection Time: 07/03/14 5:20 AM  Result Value Ref Range   WBC 5.4 4.0 - 10.5 K/uL   RBC 4.23 3.87 - 5.11 MIL/uL   Hemoglobin 12.5 12.0 - 15.0 g/dL   HCT 38.9 36.0 - 46.0 %   MCV 92.0 78.0 - 100.0 fL   MCH 29.6 26.0 - 34.0 pg   MCHC 32.1 30.0 - 36.0 g/dL   RDW 14.2 11.5 - 15.5 %   Platelets 307 150 - 400 K/uL  Basic metabolic panel Status: Abnormal   Collection Time: 07/03/14 5:20 AM  Result Value Ref Range   Sodium 141 135 - 145 mmol/L   Potassium 4.2 3.5 - 5.1 mmol/L   Chloride 112 96 - 112 mmol/L   CO2 23 19 - 32 mmol/L   Glucose, Bld 103 (H) 70 - 99 mg/dL   BUN 16 6 - 23 mg/dL   Creatinine, Ser 0.95 0.50 - 1.10 mg/dL   Calcium 8.9 8.4 - 10.5 mg/dL   GFR calc non Af Amer 53 (L) >90 mL/min   GFR calc Af Amer 62 (L) >90 mL/min    Comment: (NOTE) The eGFR has been calculated using the CKD EPI equation. This calculation has not been validated in all clinical situations. eGFR's persistently <90 mL/min signify possible Chronic Kidney Disease.    Anion gap 6 5 - 15      Imaging Results (Last 48 hours)    Dg Hip Unilat With Pelvis 2-3 Views Left  07/02/2014 CLINICAL DATA: 81 year old female with 2  day history of left-sided hip pain. EXAM: LEFT HIP (WITH PELVIS) 2-3 VIEWS COMPARISON: No priors. FINDINGS: AP view of the bony pelvis demonstrates no acute displaced fracture of the bony pelvic ring. Bilateral proximal femurs as visualized are intact, and the left femoral head is properly located. Mild degenerative changes of osteoarthritis in the hip joints bilaterally. Extensive atherosclerosis, with vascular stents noted in the right common iliac artery, and the left common and external iliac artery. IMPRESSION: 1. No acute radiographic abnormality of the bony pelvis or the left hip. 2. Mild bilateral hip joint osteoarthritis. Electronically Signed By: Vinnie Langton M.D. On: 07/02/2014 19:24        Medical Problem List and Plan: 1. Functional deficits secondary to Right thalamic infarction with profound left hemisensory deficits (esp left leg) and left hemiparesis 2. DVT Prophylaxis/Anticoagulation: Pharmaceutical: Lovenox 3. Left hip pain/Pain Management: Prn Vicodin  4. Mood: LCSW to follow for evaluation and support.  5. Neuropsych: This patient is capable of making decisions on her own behalf. 6. Skin/Wound Care: routine pressure relief measures.  7. Fluids/Electrolytes/Nutrition: Monitor I/O. Offer supplements between meals. Check lytes in am.  8. SSS: Has refused PPM in the past. Off norvasc and patient asymptomatic at this time.  Discuss 30 day monitor with cards past discharge.  9. HTN: Monitor BP every 8 hours. Blood pressures trending up and lisinopril resumed today.  10. CKD: Baseline Cr-1.31.     Post Admission Physician Evaluation: 1. Functional deficits secondary to Right thalamic infarction with profound left hemisensory deficits (esp left leg) and left hemiparesis 2. Patient is admitted to receive collaborative, interdisciplinary care between the physiatrist, rehab nursing staff, and therapy team. 3. Patient's level of medical complexity and  substantial therapy needs in context of that medical necessity cannot be provided at a lesser intensity of care such as a SNF. 4. Patient has experienced substantial functional loss from his/her baseline which was documented above under the "Functional History" and "Functional Status" headings. Judging by the patient's diagnosis, physical exam, and functional history, the patient has potential for functional progress which will result in measurable gains while on inpatient rehab. These gains will be of substantial and practical use upon discharge in facilitating mobility and self-care at the household level. 5. Physiatrist will provide 24 hour management of medical needs as well as oversight of the therapy plan/treatment and provide guidance as appropriate regarding the interaction of the two. 6. 24 hour rehab nursing will assist with bladder management, bowel management, safety, skin/wound care, disease management, medication administration and patient education and help integrate therapy concepts, techniques,education, etc. 7. PT will assess and treat for/with: Lower extremity strength, range of motion, stamina, balance, functional mobility, safety, adaptive techniques and equipment, NMR, visual spatial awareness, stroke education, family ed. Goals are: supervision to mod I. 8. OT will assess and treat for/with: ADL's, functional mobility, safety, upper extremity strength, adaptive techniques and equipment, NMR, visual spatial awareness, stroke education. Goals are: mod I to supervision . Therapy may proceed with showering this patient. 9. SLP will assess and treat for/with: speech, communication, cognition. Goals are: mod I to supervision. 10. Case Management and Social Worker will assess and treat for psychological issues and discharge planning. 11. Team conference will be held weekly to assess progress toward goals and to determine barriers to discharge. 12. Patient will receive at least 3  hours of therapy per day at least 5 days per week. 13. ELOS: 10-15 days  14. Prognosis: excellent     Meredith Staggers, MD, [facility] 07/04/2014

## 2014-07-04 NOTE — Evaluation (Signed)
Occupational Therapy Evaluation Patient Details Name: Tracy FinerOphelia W [name] MRN: [id number] DOB: 23-Nov-1926 Today's Date: 07/04/2014    History of Present Illness Patient is a 81 y/o female who presents to the ER with left sided weakness and numbness s/p fall in bathroom bruising left knee. PMH of HTN and CVA. In the ED, pt found to have elevated troponin of 0.54,  bradycardia between 45 and 52, creatinine mildly elevated at 1.3. CT head (-). MRI + right lateral thalamus infarct.   Clinical Impression   Patient independent and driving PTA. Patient currently requires up to mod assist for functional tasks and functional transfers. Patient will benefit from acute OT to increase overall independence in the areas of ADLs, functional mobility, and overall safety in order to safely discharge to venue listed below.     Follow Up Recommendations  CIR;Supervision/Assistance - 24 hour    Equipment Recommendations   (TBD, next venue of care)    Recommendations for Other Services Rehab consult     Precautions / Restrictions Precautions Precautions: Fall Restrictions Weight Bearing Restrictions: No      Mobility Bed Mobility General bed mobility comments: Patient seated in recliner upon entering room, please see PT note for more information regarding bed mobility  Transfers Overall transfer level: Needs assistance Equipment used: 1 person hand held assist Transfers: Sit to/from Stand;Stand Pivot Transfers Sit to Stand: Mod assist Stand pivot transfers: Mod assist       General transfer comment: sit<>stand from recliner and stand pivot transfer recliner <> BSC. Cues needed for safety and technique. Left knee tended to buckle during weight bearing.     Balance Overall balance assessment: Needs assistance Sitting-balance support: Bilateral upper extremity supported;Feet supported Sitting balance-Leahy Scale: Fair     Standing balance support: No upper extremity supported;During  functional activity Standing balance-Leahy Scale: Poor     ADL Overall ADL's : Needs assistance/impaired Eating/Feeding: Minimal assistance;Sitting Eating/Feeding Details (indicate cue type and reason): using L hand as gross motor component  Grooming: Minimal assistance;Sitting   Upper Body Bathing: Minimal assitance;Sitting   Lower Body Bathing: Moderate assistance;Sit to/from stand;Cueing for safety   Upper Body Dressing : Minimal assistance;Sitting   Lower Body Dressing: Moderate assistance;Sit to/from stand;Cueing for safety   Toilet Transfer: Moderate assistance;BSC;Stand-pivot   Toileting- Clothing Manipulation and Hygiene: Minimal assistance;Cueing for safety;Sit to/from stand         General ADL Comments: Patient able to reach BLEs for LB ADLs. Patient required mod assist for sit<>stands and functional transfers. Patient with decreased coordination and sensation > LUE and therapist encouraged patient to use eyes to compensate at this time. Also encouraged patient to functionally use LUE as much as possible to regain strength and function.      Vision Vision Assessment?: No apparent visual deficits          Pertinent Vitals/Pain Pain Assessment: No/denies pain     Hand Dominance Right   Extremity/Trunk Assessment Upper Extremity Assessment Upper Extremity Assessment: LUE deficits/detail LUE Deficits / Details: Increased weakness in shoulder. Elbow>distally are [facility]. Decreased hand/eye coordination and decreased fine motor coordination and control noted LUE Sensation: decreased light touch (distal to wrist > finger tips) LUE Coordination: decreased fine motor;decreased gross motor   Lower Extremity Assessment Lower Extremity Assessment: Defer to PT evaluation       Communication Communication Communication: No difficulties   Cognition Arousal/Alertness: Awake/alert Behavior During Therapy: WFL for tasks assessed/performed Overall Cognitive Status: Within  Functional Limits for tasks assessed  Home Living Family/patient expects to be discharged to:: Private residence Living Arrangements: Children (son) Available Help at Discharge: Family;Available 24 hours/day Type of Home: House Home Access: Level entry     Home Layout: One level     Bathroom Shower/Tub: Tub/shower unit;Curtain   Bathroom Toilet: Standard     Home Equipment: None          Prior Functioning/Environment Level of Independence: Independent        Comments: Pt drives.     OT Diagnosis: Generalized weakness;Hemiplegia non-dominant side   OT Problem List: Decreased strength;Decreased range of motion;Decreased activity tolerance;Impaired balance (sitting and/or standing);Decreased coordination;Decreased safety awareness;Decreased knowledge of use of DME or AE   OT Treatment/Interventions: Self-care/ADL training;Therapeutic exercise;Neuromuscular education;Energy conservation;DME and/or AE instruction;Therapeutic activities;Patient/family education;Balance training    OT Goals(Current goals can be found in the care plan section) Acute Rehab OT Goals Patient Stated Goal: get better OT Goal Formulation: With patient Time For Goal Achievement: 07/11/14 Potential to Achieve Goals: Good ADL Goals Pt Will Perform Eating: with set-up;with adaptive utensils;sitting (using compensatory strategies prn) Pt Will Perform Grooming: with set-up;sitting (using compensatory strategies prn) Pt Will Transfer to Toilet: with min assist;stand pivot transfer;bedside commode Additional ADL Goal #1: Patient will perform sit<>stands with min assist consistently in order to increase independence with functional self-care tasks  OT Frequency: Min 3X/week   Barriers to D/C: None known at this time          End of Session Equipment Utilized During Treatment: Gait belt  Activity Tolerance: Patient tolerated treatment well Patient left: in chair;with call bell/phone  within reach   Time: 0852-0911 OT Time Calculation (min): 19 min Charges:  OT General Charges $OT Visit: 1 Procedure OT Evaluation $Initial OT Evaluation Tier I: 1 Procedure  Tamina Cyphers , MS, OTR/L, CLT Pager: [phone]  07/04/2014, 9:26 AM

## 2014-07-04 NOTE — Telephone Encounter (Signed)
Called pt concerning TCM appt no answer LMOM RTC.../lmb 

## 2014-07-04 NOTE — Progress Notes (Signed)
D/C orders received for pt to go to CIR. Pt and family notified and verbalized understanding. IV and tele removed. Writer gave report to [employer]W RN.  Pt and belongings transferred to [phone] by RN and NT.

## 2014-07-04 NOTE — Discharge Summary (Signed)
Physician Discharge Summary  TRINISHA PAGET [name]:[id number] DOB: Jul 26, 1926 DOA: 07/01/2014  PCP: No primary care provider on file.  Admit date: 07/01/2014 Discharge date: 07/04/2014  Time spent: 30 minutes  Recommendations for Outpatient Follow-up:  1. Follow up with neurology as recommended 2. Follow up with PCP as needed in one week.   Discharge Diagnoses:  Principal Problem:   CVA (cerebral infarction) Active Problems:   Bradycardia   Elevated troponin   Dysuria   Constipation   Stroke   Hip pain, acute   Essential hypertension   Discharge Condition: improved  Diet recommendation: low sodiumdiet.   Filed Weights   07/01/14 0958  Weight: 52.617 kg (116 lb)    History of present illness:  81 year old lady admitted for left sided weakness and numbness. Her CT was negative for acute stroke. Neurology consulted and she was admitted for evaluation of CVA. MRI brain revealed right lateral thalamic infarct.  Hospital Course:  acute CVA: Right lateral thalamic infarct likely secondary to small vessel disease Patient had presented with left-sided numbness and left lower extremity weakness. MRI of the head consistent with right thalamic infarct. MRA right vertebral artery with outflow otherwise no hemodynamic significant stenosis. Carotid Dopplers from December 2015 with right ICA of 40-59% stenosis with increase in velocity. Patent left CEA site without restenosis. 2-D echo which was done with no source of emboli. Continue aspirin, Plavix, Trental for secondary stroke prevention. Risk factor modification. PT/OT evaluation , recommended CIR. Inpatient rehab consult ordered and plan for discharge today.   #2 hypertension Sub optimal. Resume home meds.   #3 left hip pain Ordered plain films of the left hip which are negative for acute fracture.   #4 hyperlipidemia LDL of 67. Continue TriCor.  #5 elevated troponins  Likely secondary to demand ischemia. Cardiac enzymes trending  down. Patient asymptomatic. 2-D echo with EF of 60-65%. Grade 1 diastolic dysfunction. Moderately increased left ventricular wall thickness. EKG unremarkable ont he day of admission, and yesterday. Cardiology consulted and recommendations given . No further work up needed.  #6 bradycardia Patient with history of sick sinus syndrome and has refused pacer in the past. Norvasc on hold. She is currently asymptomatic from the bradycardia.   Procedures:  Echocardiogram.   Consultations:  neurology  Discharge Exam: Filed Vitals:   07/04/14 0941  BP: 163/57  Pulse: 57  Temp: 98 F (36.7 C)  Resp: 20    General: alert afebrile comfortable Cardiovascular: s1s2 Respiratory: ctab  Discharge Instructions   Discharge Instructions    Ambulatory referral to Neurology    Complete by:  As directed   Dr. Roda Shutters requests followup in 2 months     Diet - low sodium heart healthy    Complete by:  As directed           Current Discharge Medication List    START taking these medications   Details  atorvastatin (LIPITOR) 10 MG tablet Take 1 tablet (10 mg total) by mouth daily at 6 PM. Qty: 30 tablet, Refills: 1    benazepril (LOTENSIN) 40 MG tablet Take 1 tablet (40 mg total) by mouth daily. Qty: 30 tablet, Refills: 1      CONTINUE these medications which have CHANGED   Details  HYDROcodone-acetaminophen (NORCO/VICODIN) 5-325 MG per tablet Take 1 tablet by mouth every 6 (six) hours as needed for moderate pain. Qty: 10 tablet, Refills: 0      CONTINUE these medications which have NOT CHANGED   Details  aspirin EC 81 MG tablet Take 81 mg by mouth daily.    clopidogrel (PLAVIX) 75 MG tablet Take 75 mg by mouth daily.    fenofibrate micronized (LOFIBRA) 134 MG capsule Take 134 mg by mouth daily.    latanoprost (XALATAN) 0.005 % ophthalmic solution Place 1 drop into both eyes daily.    pentoxifylline (TRENTAL) 400 MG CR tablet Take 400 mg by mouth daily.      STOP taking these  medications     amLODipine-benazepril (LOTREL) 10-40 MG per capsule        No Known Allergies Follow-up Information    Follow up with Xu,Jindong, MD In 2 months.   Specialty:  Neurology   Why:  Stroke [facility], Office will call you with appointment date & time   Contact information:   [address] [place] [address] [phone]        The results of significant diagnostics from this hospitalization (including imaging, microbiology, ancillary and laboratory) are listed below for reference.    Significant Diagnostic Studies: Ct Head (brain) Wo Contrast  07/01/2014   CLINICAL DATA:  Left-sided numbness and weakness since yesterday.  EXAM: CT HEAD WITHOUT CONTRAST  TECHNIQUE: Contiguous axial images were obtained from the base of the skull through the vertex without intravenous contrast.  COMPARISON:  None.  FINDINGS: There is no evidence of mass effect, midline shift, or extra-axial fluid collections. There is no evidence of a space-occupying lesion or intracranial hemorrhage. There is no evidence of a cortical-based area of acute infarction. There are bilateral old parieto-occipital lobe infarcts. There is an old left cerebellar infarct. There is a small left lacunar sub insular infarct. There is generalized cerebral atrophy. There is periventricular white matter low attenuation likely secondary to microangiopathy.  The ventricles and sulci are appropriate for the patient's age. The basal cisterns are patent.  Visualized portions of the orbits are unremarkable. The visualized portions of the paranasal sinuses and mastoid air cells are unremarkable. Cerebrovascular atherosclerotic calcifications are noted.  The osseous structures are unremarkable.  IMPRESSION: 1. No acute intracranial pathology. 2. Chronic microvascular disease and cerebral atrophy. 3. Bilateral old parieto-occipital lobe infarcts and left cerebellar infarct.   Electronically Signed   By: Elige Ko   On:  07/01/2014 11:11   Mr Brain Wo Contrast  07/01/2014   CLINICAL DATA:  Left-sided weakness and numbness.  Acute onset.  EXAM: MRI HEAD WITHOUT CONTRAST  MRA HEAD WITHOUT CONTRAST  TECHNIQUE: Multiplanar, multiecho pulse sequences of the brain and surrounding structures were obtained without intravenous contrast. Angiographic images of the head were obtained using MRA technique without contrast.  COMPARISON:  CT scan same day.  FINDINGS: MRI HEAD FINDINGS  The study suffers from motion degradation. There is a 1 cm acute infarction in the right lateral thalamus. No evidence of hemorrhage or mass effect. Elsewhere, there are old cerebellar infarctions an old bilateral occipital infarctions. There chronic small vessel infarctions within the thalami, basal ganglia and hemispheric deep white matter.  MRA HEAD FINDINGS  Very motion limited. Both internal carotid arteries show antegrade flow. Flow is present at least proximally within the anterior and middle cerebral arteries. No antegrade flow seen in the right vertebral artery. Left vertebral artery is patent to the basilar. Flow is present in both PCAs proximally.  IMPRESSION: Severely motion degraded exam. 1 cm acute infarction in the right lateral thalamus.  Extensive old ischemic changes elsewhere throughout the brain as outlined above.  Motion degraded MR angiogram.  Major vessels all show flow except for the right vertebral artery.   Electronically Signed   By: Paulina FusiMark  Shogry M.D.   On: 07/01/2014 20:57   Dg Chest Port 1 View  07/01/2014   CLINICAL DATA:  Bradycardia  EXAM: PORTABLE CHEST - 1 VIEW  COMPARISON:  None.  FINDINGS: There are scattered calcified granulomas throughout the lungs bilaterally. There is no edema or consolidation. Heart is mildly enlarged with pulmonary vascularity within normal limits. No adenopathy. There is atherosclerotic change in aorta. There is sclerosis in the proximal right humerus.  IMPRESSION: Multiple granulomas. No edema or  consolidation. Mild cardiomegaly. Incomplete visualization of sclerosis in the right proximal humerus. This area may represent bone infarct. It may be prudent to consider dedicated right humerus radiographs to further evaluate this region.   Electronically Signed   By: Bretta BangWilliam  Woodruff III M.D.   On: 07/01/2014 12:13   Mr Maxine GlennMra Head/brain Wo Cm  07/01/2014   CLINICAL DATA:  Left-sided weakness and numbness.  Acute onset.  EXAM: MRI HEAD WITHOUT CONTRAST  MRA HEAD WITHOUT CONTRAST  TECHNIQUE: Multiplanar, multiecho pulse sequences of the brain and surrounding structures were obtained without intravenous contrast. Angiographic images of the head were obtained using MRA technique without contrast.  COMPARISON:  CT scan same day.  FINDINGS: MRI HEAD FINDINGS  The study suffers from motion degradation. There is a 1 cm acute infarction in the right lateral thalamus. No evidence of hemorrhage or mass effect. Elsewhere, there are old cerebellar infarctions an old bilateral occipital infarctions. There chronic small vessel infarctions within the thalami, basal ganglia and hemispheric deep white matter.  MRA HEAD FINDINGS  Very motion limited. Both internal carotid arteries show antegrade flow. Flow is present at least proximally within the anterior and middle cerebral arteries. No antegrade flow seen in the right vertebral artery. Left vertebral artery is patent to the basilar. Flow is present in both PCAs proximally.  IMPRESSION: Severely motion degraded exam. 1 cm acute infarction in the right lateral thalamus.  Extensive old ischemic changes elsewhere throughout the brain as outlined above.  Motion degraded MR angiogram. Major vessels all show flow except for the right vertebral artery.   Electronically Signed   By: Paulina FusiMark  Shogry M.D.   On: 07/01/2014 20:57   Dg Hip Unilat With Pelvis 2-3 Views Left  07/02/2014   CLINICAL DATA:  81 year old female with 2 day history of left-sided hip pain.  EXAM: LEFT HIP (WITH  PELVIS) 2-3 VIEWS  COMPARISON:  No priors.  FINDINGS: AP view of the bony pelvis demonstrates no acute displaced fracture of the bony pelvic ring. Bilateral proximal femurs as visualized are intact, and the left femoral head is properly located. Mild degenerative changes of osteoarthritis in the hip joints bilaterally. Extensive atherosclerosis, with vascular stents noted in the right common iliac artery, and the left common and external iliac artery.  IMPRESSION: 1. No acute radiographic abnormality of the bony pelvis or the left hip. 2. Mild bilateral hip joint osteoarthritis.   Electronically Signed   By: Trudie Reedaniel  Entrikin M.D.   On: 07/02/2014 19:24    Microbiology: Recent Results (from the past 240 hour(s))  Urine culture     Status: None   Collection Time: 07/01/14  3:10 PM  Result Value Ref Range Status   Specimen Description URINE, CLEAN CATCH  Final   Special Requests NONE  Final   Colony Count   Final    30,000 COLONIES/ML [facility]formed at [facility]  Culture   Final    Multiple bacterial morphotypes present, none predominant. Suggest appropriate recollection if clinically indicated. Performed at [facility]    Report Status 07/02/2014 FINAL  Final     Labs: Basic Metabolic Panel:  Recent Labs Lab 07/01/14 1020 07/01/14 1806 07/02/14 0932 07/03/14 0520  NA 142  --  139 141  K 4.6  --  3.9 4.2  CL 109  --  107 112  CO2 23  --  26 23  GLUCOSE 107*  --  80 103*  BUN 28*  --  20 16  CREATININE 1.31* 1.16* 1.15* 0.95  CALCIUM 10.2  --  9.3 8.9   Liver Function Tests:  Recent Labs Lab 07/01/14 1020  AST 36  ALT 13  ALKPHOS 28*  BILITOT 1.0  PROT 6.7  ALBUMIN 3.8   No results for input(s): LIPASE, AMYLASE in the last 168 hours. No results for input(s): AMMONIA in the last 168 hours. CBC:  Recent Labs Lab 07/01/14 1020 07/03/14 0520  WBC 6.7 5.4  NEUTROABS 4.7  --   HGB 13.1 12.5  HCT 39.7 38.9  MCV 90.8 92.0  PLT 346 307   Cardiac  Enzymes:  Recent Labs Lab 07/01/14 1020 07/01/14 1806 07/01/14 2350 07/02/14 0230  TROPONINI 0.54* 0.53* 0.48* 0.36*   BNP: BNP (last 3 results) No results for input(s): BNP in the last 8760 hours.  ProBNP (last 3 results) No results for input(s): PROBNP in the last 8760 hours.  CBG:  Recent Labs Lab 07/01/14 1203 07/01/14 1322  GLUCAP 71 75       Signed:  Tabitha Tupper  [facility] 07/04/2014, 12:29 PM

## 2014-07-04 NOTE — Progress Notes (Signed)
Tracy OysterZachary T Swartz, MD Physician Signed Physical Medicine and Rehabilitation Consult Note 07/03/2014 7:24 AM  Related encounter: ED to Hosp-Admission (Discharged) from 07/01/2014 [facility]    Expand All Collapse All        Physical Medicine and Rehabilitation Consult   Reason for Consult: Left sided weakness and numbness Referring Physician: Dr. Roda ShuttersXu   HPI: Tracy [name] is a 81 y.o. 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 Family member insisted on bringing her to ED for evaluation and patient was found to be bradycardic with HR 40-50s. CT head showed chronic bilateral occipital and left cerebellar infarcts and negative for acute changes. MRI/MRA brain done revealing 1 cm acute infarction in the right lateral thalamus, extensive old ischemic changes throughout the brain and no antegrade flow in R-VA. 2D echo with EF 60-655 with mild mitral calcification and moderately dilated right atrium. Carotid dopplers without significant ICA stenosis. Pelvic films done due to complaints of left hip pain and revealed mild bilateral hip OA. Patient continues to have bouts of bradycardia but has refused PPM in the past. Dr. 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    Review of Systems  HENT: Negative for hearing loss.  Eyes: Negative for blurred vision and double vision.  Respiratory: Negative for cough and shortness of breath.  Cardiovascular: Negative for chest pain and palpitations.  Gastrointestinal: Negative for heartburn and nausea.  Musculoskeletal: Negative for myalgias, back pain, joint pain and falls.  Neurological:  Positive for speech change and focal weakness. Negative for dizziness, weakness and headaches.      Past Medical History  Diagnosis Date  . Stroke   . Hypertension     History reviewed. No pertinent past surgical history.    Family History  Problem Relation Age of Onset  . Cancer Mother     colon      Social History: Widowed. Retired from [employer]. Son lives with her (retired and can provide supervision?). Tracy [name] was independent and active without AD. Tracy [name] reports that Tracy [name] has never smoked. Tracy [name] does not have any smokeless tobacco history on file. Tracy [name] reports that Tracy [name] does not drink alcohol or use illicit drugs.    Allergies: No Known Allergies    Medications Prior to Admission  Medication Sig Dispense Refill  . amLODipine-benazepril (LOTREL) 10-40 MG per capsule Take 1 capsule by mouth daily.    Marland Kitchen. aspirin EC 81 MG tablet Take 81 mg by mouth daily.    . clopidogrel (PLAVIX) 75 MG tablet Take 75 mg by mouth daily.    . fenofibrate micronized (LOFIBRA) 134 MG capsule Take 134 mg by mouth daily.    Marland Kitchen. HYDROcodone-acetaminophen (NORCO/VICODIN) 5-325 MG per tablet Take 1 tablet by mouth every 6 (six) hours as needed for moderate pain.    Marland Kitchen. latanoprost (XALATAN) 0.005 % ophthalmic solution Place 1 drop into both eyes daily.    . pentoxifylline (TRENTAL) 400 MG CR tablet Take 400 mg by mouth daily.      Home: Home Living Family/patient expects to be discharged to:: Private residence Living Arrangements: Children Available Help at Discharge: Family, Available 24 hours/day Type of Home:  House Home Access: Level entry Home Layout: One level Home Equipment: None  Functional History: Prior Function Level of Independence: Independent Comments: Pt drives.  Functional Status:  Mobility: Bed Mobility Overal bed mobility: Needs Assistance Bed Mobility: Rolling, Sidelying to Sit, Sit to Supine Rolling: Min  guard Sidelying to sit: Mod assist, HOB elevated Sit to supine: Min guard General bed mobility comments: Mod A to elevate trunk. Max cues to perform transfer and for technique, "i can't i can't" Use of rails for support.  Transfers Overall transfer level: Needs assistance Equipment used: 1 person hand held assist Transfers: Sit to/from Stand, Stand Pivot Transfers Sit to Stand: Mod assist Stand pivot transfers: Mod assist General transfer comment: Mod A to stand from EOB. Very unsteady with increased sway noted upon standing. Bil knee instability, L>R. SPT bed to/from chair x2 Mod A with cues for technique and therapist blocking LLE. Ambulation/Gait General Gait Details: Deferred due to safety concerns.    ADL:    Cognition: Cognition Overall Cognitive Status: Within Functional Limits for tasks assessed Orientation Level: Oriented X4 Cognition Arousal/Alertness: Awake/alert Behavior During Therapy: WFL for tasks assessed/performed Overall Cognitive Status: Within Functional Limits for tasks assessed  Blood pressure 146/74, pulse 46, temperature 98.1 F (36.7 C), temperature source Oral, resp. rate 18, height  (1.499 m), weight 52.617 kg (116 lb), SpO2 98 %. Physical Exam  Nursing note and vitals reviewed. Constitutional: Tracy [name] appears well-developed and well-nourished.  HENT:  Head: Normocephalic and atraumatic.  Eyes: Conjunctivae are normal. Pupils are equal, round, and reactive to light.  Neck: Normal range of motion. Neck supple.  Cardiovascular: An irregular rhythm present. Bradycardia present.  Respiratory: Effort normal and breath sounds normal.  GI: Soft. Bowel sounds are normal. Tracy [name] exhibits no distension. There is no tenderness.  Musculoskeletal: Tracy [name] exhibits no edema or tenderness.  Neurological: Tracy [name] is alert.  Oriented to self, place, month, year, DOB/age. Had a hard time accepting/realizing that Tracy [name] has had a stroke. Able to follow 1 and 2 step commands  without difficulty. Speech clear. Mild left facial weakness. Left hemiparesis with ataxia and sensory deficits. LUE 4/5 prox to distal. LLE: 2/5hf, 3/5 ke and 3+ to 4 adf/apf. Sensory 1+/2 left face, 1+/2 fingers/left hand, 1/2 prox left leg trace distal leg and foot  Skin: Skin is warm and dry.     Lab Results Last 24 Hours    Results for orders placed or performed during the hospital encounter of 07/01/14 (from the past 24 hour(s))  Basic metabolic panel Status: Abnormal   Collection Time: 07/02/14 9:32 AM  Result Value Ref Range   Sodium 139 135 - 145 mmol/L   Potassium 3.9 3.5 - 5.1 mmol/L   Chloride 107 96 - 112 mmol/L   CO2 26 19 - 32 mmol/L   Glucose, Bld 80 70 - 99 mg/dL   BUN 20 6 - 23 mg/dL   Creatinine, Ser 1.61 (H) 0.50 - 1.10 mg/dL   Calcium 9.3 8.4 - 09.6 mg/dL   GFR calc non Af Amer 42 (L) >90 mL/min   GFR calc Af Amer 49 (L) >90 mL/min   Anion gap 6 5 - 15  CBC Status: None   Collection Time: 07/03/14 5:20 AM  Result Value Ref Range   WBC 5.4 4.0 - 10.5 K/uL   RBC 4.23 3.87 - 5.11 MIL/uL   Hemoglobin 12.5 12.0 - 15.0 g/dL   HCT 04.5 40.9 - 81.1 %   MCV 92.0 78.0 - 100.0 fL  MCH 29.6 26.0 - 34.0 pg   MCHC 32.1 30.0 - 36.0 g/dL   RDW 13.0 86.5 - 78.4 %   Platelets 307 150 - 400 K/uL  Basic metabolic panel Status: Abnormal   Collection Time: 07/03/14 5:20 AM  Result Value Ref Range   Sodium 141 135 - 145 mmol/L   Potassium 4.2 3.5 - 5.1 mmol/L   Chloride 112 96 - 112 mmol/L   CO2 23 19 - 32 mmol/L   Glucose, Bld 103 (H) 70 - 99 mg/dL   BUN 16 6 - 23 mg/dL   Creatinine, Ser 6.96 0.50 - 1.10 mg/dL   Calcium 8.9 8.4 - 29.5 mg/dL   GFR calc non Af Amer 53 (L) >90 mL/min   GFR calc Af Amer 62 (L) >90 mL/min   Anion gap 6 5 - 15      Imaging Results (Last 48 hours)    Ct Head (brain) Wo  Contrast  07/01/2014 CLINICAL DATA: Left-sided numbness and weakness since yesterday. EXAM: CT HEAD WITHOUT CONTRAST TECHNIQUE: Contiguous axial images were obtained from the base of the skull through the vertex without intravenous contrast. COMPARISON: None. FINDINGS: There is no evidence of mass effect, midline shift, or extra-axial fluid collections. There is no evidence of a space-occupying lesion or intracranial hemorrhage. There is no evidence of a cortical-based area of acute infarction. There are bilateral old parieto-occipital lobe infarcts. There is an old left cerebellar infarct. There is a small left lacunar sub insular infarct. There is generalized cerebral atrophy. There is periventricular white matter low attenuation likely secondary to microangiopathy. The ventricles and sulci are appropriate for the patient's age. The basal cisterns are patent. Visualized portions of the orbits are unremarkable. The visualized portions of the paranasal sinuses and mastoid air cells are unremarkable. Cerebrovascular atherosclerotic calcifications are noted. The osseous structures are unremarkable. IMPRESSION: 1. No acute intracranial pathology. 2. Chronic microvascular disease and cerebral atrophy. 3. Bilateral old parieto-occipital lobe infarcts and left cerebellar infarct. Electronically Signed By: Elige Ko On: 07/01/2014 11:11   Mr Brain Wo Contrast  07/01/2014 CLINICAL DATA: Left-sided weakness and numbness. Acute onset. EXAM: MRI HEAD WITHOUT CONTRAST MRA HEAD WITHOUT CONTRAST TECHNIQUE: Multiplanar, multiecho pulse sequences of the brain and surrounding structures were obtained without intravenous contrast. Angiographic images of the head were obtained using MRA technique without contrast. COMPARISON: CT scan same day. FINDINGS: MRI HEAD FINDINGS The study suffers from motion degradation. There is a 1 cm acute infarction in the right lateral thalamus. No evidence of  hemorrhage or mass effect. Elsewhere, there are old cerebellar infarctions an old bilateral occipital infarctions. There chronic small vessel infarctions within the thalami, basal ganglia and hemispheric deep white matter. MRA HEAD FINDINGS Very motion limited. Both internal carotid arteries show antegrade flow. Flow is present at least proximally within the anterior and middle cerebral arteries. No antegrade flow seen in the right vertebral artery. Left vertebral artery is patent to the basilar. Flow is present in both PCAs proximally. IMPRESSION: Severely motion degraded exam. 1 cm acute infarction in the right lateral thalamus. Extensive old ischemic changes elsewhere throughout the brain as outlined above. Motion degraded MR angiogram. Major vessels all show flow except for the right vertebral artery. Electronically Signed By: Paulina Fusi M.D. On: 07/01/2014 20:57   Dg Chest Port 1 View  07/01/2014 CLINICAL DATA: Bradycardia EXAM: PORTABLE CHEST - 1 VIEW COMPARISON: None. FINDINGS: There are scattered calcified granulomas throughout the lungs bilaterally. There is no edema or consolidation. Heart is  mildly enlarged with pulmonary vascularity within normal limits. No adenopathy. There is atherosclerotic change in aorta. There is sclerosis in the proximal right humerus. IMPRESSION: Multiple granulomas. No edema or consolidation. Mild cardiomegaly. Incomplete visualization of sclerosis in the right proximal humerus. This area may represent bone infarct. It may be prudent to consider dedicated right humerus radiographs to further evaluate this region. Electronically Signed By: Bretta Bang III M.D. On: 07/01/2014 12:13   Mr Maxine Glenn Head/brain Wo Cm  07/01/2014 CLINICAL DATA: Left-sided weakness and numbness. Acute onset. EXAM: MRI HEAD WITHOUT CONTRAST MRA HEAD WITHOUT CONTRAST TECHNIQUE: Multiplanar, multiecho pulse sequences of the brain and surrounding structures were  obtained without intravenous contrast. Angiographic images of the head were obtained using MRA technique without contrast. COMPARISON: CT scan same day. FINDINGS: MRI HEAD FINDINGS The study suffers from motion degradation. There is a 1 cm acute infarction in the right lateral thalamus. No evidence of hemorrhage or mass effect. Elsewhere, there are old cerebellar infarctions an old bilateral occipital infarctions. There chronic small vessel infarctions within the thalami, basal ganglia and hemispheric deep white matter. MRA HEAD FINDINGS Very motion limited. Both internal carotid arteries show antegrade flow. Flow is present at least proximally within the anterior and middle cerebral arteries. No antegrade flow seen in the right vertebral artery. Left vertebral artery is patent to the basilar. Flow is present in both PCAs proximally. IMPRESSION: Severely motion degraded exam. 1 cm acute infarction in the right lateral thalamus. Extensive old ischemic changes elsewhere throughout the brain as outlined above. Motion degraded MR angiogram. Major vessels all show flow except for the right vertebral artery. Electronically Signed By: Paulina Fusi M.D. On: 07/01/2014 20:57   Dg Hip Unilat With Pelvis 2-3 Views Left  07/02/2014 CLINICAL DATA: 81 year old female with 2 day history of left-sided hip pain. EXAM: LEFT HIP (WITH PELVIS) 2-3 VIEWS COMPARISON: No priors. FINDINGS: AP view of the bony pelvis demonstrates no acute displaced fracture of the bony pelvic ring. Bilateral proximal femurs as visualized are intact, and the left femoral head is properly located. Mild degenerative changes of osteoarthritis in the hip joints bilaterally. Extensive atherosclerosis, with vascular stents noted in the right common iliac artery, and the left common and external iliac artery. IMPRESSION: 1. No acute radiographic abnormality of the bony pelvis or the left hip. 2. Mild bilateral hip joint osteoarthritis.  Electronically Signed By: Trudie Reed M.D. On: 07/02/2014 19:24     Assessment/Plan: Diagnosis: right thalamic infarction with profound left hemisensory deficits (esp left leg) and left hemiparesis 1. Does the need for close, 24 hr/day medical supervision in concert with the patient's rehab needs make it unreasonable for this patient to be served in a less intensive setting? Yes 2. Co-Morbidities requiring supervision/potential complications: htn, post-stroke sequelae 3. Due to bladder management, bowel management, safety, skin/wound care, disease management, medication administration, pain management and patient education, does the patient require 24 hr/day rehab nursing? Yes 4. Does the patient require coordinated care of a physician, rehab nurse, PT (1-2 hrs/day, 5 days/week), OT (1-2 hrs/day, 5 days/week) and SLP (1-2 hrs/day, 5 days/week) to address physical and functional deficits in the context of the above medical diagnosis(es)? Yes Addressing deficits in the following areas: balance, endurance, locomotion, strength, transferring, bowel/bladder control, bathing, dressing, feeding, grooming, toileting and psychosocial support 5. Can the patient actively participate in an intensive therapy program of at least 3 hrs of therapy per day at least 5 days per week? Yes 6. The potential for patient to make measurable  gains while on inpatient rehab is excellent 7. Anticipated functional outcomes upon discharge from inpatient rehab are supervision and min assist with PT, supervision and min assist with OT, modified independent with SLP. 8. Estimated rehab length of stay to reach the above functional goals is: 11-16 days 9. Does the patient have adequate social supports and living environment to accommodate these discharge functional goals? Yes 10. Anticipated D/C setting: Home 11. Anticipated post D/C treatments: HH therapy and Outpatient therapy 12. Overall Rehab/Functional Prognosis:  excellent  RECOMMENDATIONS: This patient's condition is appropriate for continued rehabilitative care in the following setting: CIR Patient has agreed to participate in recommended program. Yes Note that insurance prior authorization may be required for reimbursement for recommended care.  Comment: Rehab Admissions Coordinator to follow up.  Thanks,  Tracy Oyster, MD, [facility]     07/03/2014       Revision History     Date/Time User Provider Type Action   07/03/2014 10:34 AM Tracy Oyster, MD Physician Sign   07/03/2014 8:01 AM Jacquelynn Cree, PA-C Physician Assistant Share   View Details Report       Routing History     Date/Time From To Method   07/03/2014 10:34 AM Tracy Oyster, MD Tracy Oyster, MD In Basket

## 2014-07-04 NOTE — Progress Notes (Signed)
[facility] Admission Coordinator Signed Physical Medicine and Rehabilitation PMR Pre-admission 07/04/2014 12:45 PM  Related encounter: ED to Hosp-Admission (Discharged) from 07/01/2014 in [facility]   PMR Admission Coordinator Pre-Admission Assessment  Patient: Tracy [name] is an 81 y.o., female MRN: [id number] DOB: 04-09-1926 Height: 4' 11" (149.9 cm) Weight: 52.617 kg (116 lb)  Insurance Information HMO: yes PPO: PCP: IPA: 80/20: OTHER:  PRIMARY: [employer] Medicare Policy#: [id number] Subscriber: self CM Name: Anette Guarneri Phone#: [phone], [phone] Fax#: [phone] Approval for CIR given on 07-04-14 from [facility] for seven days. Follow up will be with onsite reviewer Gaylan Gerold, RN (phone: [phone]). Pre-Cert#: [id number] Employer: retired Benefits: Phone #: [phone] Name: per [facility]. Date: 04-05-14 Deduct: none Out of Pocket Max: [phone] (none met) Life Max: unlimited CIR: $430 copay/day for days 1-4, pre-auth needed SNF: $0/day for days 1-20; $160/day for days 21-62; $0/day for days 63-100 (100 day visit max, pre-auth needed) Outpatient: covered Co-Pay: $40 copay, no visit limits Home Health: 100% Co-Pay: none, no visit limits DME: 80% Co-Pay: 20% Providers: in network  Emergency Contact Information Contact Information    Name Relation Home Work [place] Son [phone]  [phone]   Greydis, Stlouis   [phone]   Hayes,Cathy Daughter   [phone]     Current Medical History  Patient Admitting Diagnosis: right thalamic infarction with profound left hemisensory deficits (esp left leg) and left  hemiparesis  History of Present Illness: Tracy [name] is a 81 y.o. 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 Family member insisted on bringing her to ED for evaluation and patient was found to be bradycardic with HR 40-50s. CT head showed chronic bilateral occipital and left cerebellar infarcts and negative for acute changes. MRI/MRA brain done revealing 1 cm acute infarction in the right lateral thalamus, extensive old ischemic changes throughout the brain and no antegrade flow in R-VA. 2D echo with EF 60-655 with mild mitral calcification and moderately dilated right atrium. Carotid dopplers without significant ICA stenosis. Pelvic films done due to complaints of left hip pain and revealed mild bilateral hip OA. Patient continues to have bouts of bradycardia but has refused PPM in the past. Dr. 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   NIH Total: 6  Past Medical History  Past Medical History  Diagnosis Date  . Stroke   . Hypertension     Family History  family history includes Cancer in her mother.  Prior Rehab/Hospitalizations: pt has had outpt PT for R shoulder rotator cuff surgery.  Current Medications   Current facility-administered medications:  . aspirin EC tablet 81 mg, 81 mg, Oral, Daily, Melton Alar, PA-C, 81 mg at 07/04/14 0949 . atorvastatin (LIPITOR) tablet 10 mg, 10 mg, Oral, q1800, Rosalin Hawking, MD, 10 mg at 07/03/14 1736 . benazepril (LOTENSIN) tablet 40 mg, 40 mg, Oral, Daily, Hosie Poisson, MD, 40 mg at 07/04/14 1235 . clopidogrel (PLAVIX) tablet 75 mg, 75 mg, Oral, Daily, Melton Alar, PA-C, 75 mg at 07/04/14  0949 . fenofibrate tablet 160 mg, 160 mg, Oral, Daily, Melton Alar, PA-C,  160 mg at 07/04/14 0949 . hydrALAZINE (APRESOLINE) injection 10 mg, 10 mg, Intravenous, Q6H PRN, Melton Alar, PA-C, 10 mg at 07/04/14 8756 . HYDROcodone-acetaminophen (NORCO/VICODIN) 5-325 MG per tablet 1 tablet, 1 tablet, Oral, Q6H PRN, Bobby Rumpf York, PA-C . latanoprost (XALATAN) 0.005 % ophthalmic solution 1 drop, 1 drop, Both Eyes, QHS, Marianne L York, PA-C, 1 drop at 07/03/14 2100 . pentoxifylline (TRENTAL) CR tablet 400 mg, 400 mg, Oral, Daily, Melton Alar, PA-C, 400 mg at 07/04/14 0949 . polyethylene glycol (MIRALAX / GLYCOLAX) packet 17 g, 17 g, Oral, Daily, Melton Alar, PA-C, 17 g at 07/04/14 0949 . senna-docusate (Senokot-S) tablet 1 tablet, 1 tablet, Oral, QHS PRN, Melton Alar, PA-C  Patients Current Diet: Diet Heart Room service appropriate?: Yes; Fluid consistency:: Thin Diet - low sodium heart healthy  Precautions / Restrictions Precautions Precautions: Fall Restrictions Weight Bearing Restrictions: No   Prior Activity Level Community (5-7x/wk): Pt was very independent prior to CVA and is motivated to get home and back to her baseline. She drove and enjoyred running errands each day. She and her son share household duties (son Jonathon Bellows lives with her). Pt is retired from [employer].   Home Assistive Devices / Equipment Home Equipment: None  Prior Functional Level Prior Function Level of Independence: Independent Comments: Pt drives.   Current Functional Level Cognition  Overall Cognitive Status: Within Functional Limits for tasks assessed Orientation Level: Oriented X4   Extremity Assessment (includes Sensation/Coordination)  Upper Extremity Assessment: LUE deficits/detail LUE Deficits / Details: Increased weakness in shoulder. Elbow>distally are [facility]. Decreased hand/eye coordination and decreased fine motor coordination and control noted LUE Sensation:  decreased light touch (distal to wrist > finger tips) LUE Coordination: decreased fine motor, decreased gross motor  Lower Extremity Assessment: Defer to PT evaluation LLE Deficits / Details: Difficult to assess due to pain with AROM (from fall). Grossly ~2+-3/5 throughout hip, knee, ankle. LLE Sensation: decreased light touch (foot) LLE Coordination: (Difficult to assess secondary to pain with movement.)    ADLs  Overall ADL's : Needs assistance/impaired Eating/Feeding: Minimal assistance, Sitting Eating/Feeding Details (indicate cue type and reason): using L hand as gross motor component  Grooming: Minimal assistance, Sitting Upper Body Bathing: Minimal assitance, Sitting Lower Body Bathing: Moderate assistance, Sit to/from stand, Cueing for safety Upper Body Dressing : Minimal assistance, Sitting Lower Body Dressing: Moderate assistance, Sit to/from stand, Cueing for safety Toilet Transfer: Moderate assistance, BSC, Stand-pivot Toileting- Clothing Manipulation and Hygiene: Minimal assistance, Cueing for safety, Sit to/from stand General ADL Comments: Patient able to reach BLEs for LB ADLs. Patient required mod assist for sit<>stands and functional transfers. Patient with decreased coordination and sensation > LUE and therapist encouraged patient to use eyes to compensate at this time. Also encouraged patient to functionally use LUE as much as possible to regain strengh and function.     Mobility  Overal bed mobility: Needs Assistance Bed Mobility: Rolling, Sidelying to Sit Rolling: Min guard Sidelying to sit: Mod assist Sit to supine: Min guard General bed mobility comments: Patient seated in recliner upon entering room, please see PT note for more information regarding bed mobility    Transfers  Overall transfer level: Needs assistance Equipment used: 1 person hand held assist Transfers: Sit to/from Stand, Stand Pivot Transfers Sit to Stand: Mod assist Stand pivot  transfers: Mod assist General transfer comment: sit<>stand from recliner and stand pivot transfer recliner <> BSC. Cues needed for safety and technique. Left knee tended to buckle  during weight bearing.     Ambulation / Gait / Stairs / Wheelchair Mobility  Ambulation/Gait Ambulation/Gait assistance: Mod assist, +2 physical assistance Ambulation Distance (Feet): 40 Feet (x2 bouts) Assistive device: 1 person hand held assist Gait Pattern/deviations: Step-through pattern, Decreased stride length, Decreased step length - left, Decreased step length - right, Decreased weight shift to left, Narrow base of support Gait velocity interpretation: Below normal speed for age/gender General Gait Details: Manual/verbal cues for hip extension, upright posture and wide bOS. Assist to advance LLE and for left knee stability as pt with left knee buckling without therapist blocking left knee during stance phase of gait. Fatigues quickly. 2 seated rest breaks.    Posture / Balance Balance Overall balance assessment: Needs assistance Sitting-balance support: Bilateral upper extremity supported, Feet supported Sitting balance-Leahy Scale: Fair Standing balance support: No upper extremity supported, During functional activity Standing balance-Leahy Scale: Poor Standing balance comment: Requires external support for balance in standing with Mod manual cues for hip extension and thoracic/lumbar extension. Increased sway noted ins tanding with left knee buckling without support.     Special needs/care consideration BiPAP/CPAP no CPM no  Continuous Drip IV no  Dialysis no  Life Vest no  Oxygen no  Special Bed no  Trach Size no  Wound Vac (area) no  Skin - pt states she has a sore knot on her stomach from recent injections  Bowel mgmt: last BM on 06-30-14 Bladder mgmt: currently using bedpan  Diabetic mgmt no   Previous Home Environment Living  Arrangements: Children (son) Available Help at Discharge: Family, Available 24 hours/day Type of Home: House Home Layout: One level Home Access: Level entry Bathroom Shower/Tub: Tub/shower unit, Architectural technologist: Standard Home Care Services: No  Discharge Living Setting Plans for Discharge Living Setting: Patient's home, House Type of Home at Discharge: House Discharge Home Layout: One level Discharge Home Access: Level entry Discharge Bathroom Shower/Tub: Tub/shower unit Discharge Bathroom Toilet: Standard Does the patient have any problems obtaining your medications?: No  Social/Family/Support Systems Patient Roles: Other (Comment) (lives with her son, active at baseline) Contact Information: son Jonathon Bellows is primary contact (he lives with her) Anticipated Caregiver: son Jonathon Bellows Anticipated Ambulance person Information: see above Ability/Limitations of Caregiver: no limitations from [employer] Caregiver Availability: 24/7 Discharge Plan Discussed with Primary Caregiver: Yes Is Caregiver In Agreement with Plan?: Yes Does Caregiver/Family have Issues with Lodging/Transportation while Pt is in Rehab?: No  Goals/Additional Needs Patient/Family Goal for Rehab: Supervision and Minimal assistance with PT/OT; Mod Ind with SLP Expected length of stay: 11-16 days Cultural Considerations: none Dietary Needs: heart healthy, thin liquids Equipment Needs: to be determined Pt/Family Agrees to Admission and willing to participate: Yes (spoke with pt and two sons on 07-03-14) Program Orientation Provided & Reviewed with Pt/Caregiver Including Roles & Responsibilities: Yes   Decrease burden of Care through IP rehab admission: NA   Possible need for SNF placement upon discharge: not anticipated   Patient Condition: This patient's condition remains as documented in the consult dated 07-03-14, in which the Rehabilitation Physician determined and documented that the patient's condition is  appropriate for intensive rehabilitative care in an inpatient rehabilitation facility. Will admit to inpatient rehab today.  Preadmission Screen Completed By: Nanetta Batty, PT, 07/04/2014 1:01 PM ______________________________________________________________________  Discussed status with Dr. Naaman Plummer on 07-04-14 at 1245 and received telephone approval for admission today.  Admission Coordinator: Nanetta Batty, PT, time 1245/Date 07-04-14          Cosigned by: Celesta Gentile  Naaman Plummer, MD at 07/04/2014 1:03 PM  Revision History     Date/Time User Provider Type Action   07/04/2014 1:03 PM Meredith Staggers, MD Physician Cosign   07/04/2014 1:02 PM [facility] Admission Coordinator Sign

## 2014-07-04 NOTE — Progress Notes (Signed)
Physical Therapy Treatment Patient Details Name: Tracy [name] MRN: [id number] DOB: Nov 05, 1926 Today's Date: 07/04/2014    History of Present Illness Patient is a 81 y/o female who presents to the ER with left sided weakness and numbness s/p fall in bathroom bruising left knee. PMH of HTN and CVA. In the ED, pt found to have elevated troponin of 0.54,  bradycardia between 45 and 52, creatinine mildly elevated at 1.3. CT head (-). MRI + right lateral thalamus infarct.    PT Comments    Patient progressing well with mobility. Reports improved sensation in LLE. Able to advance LLE initially without physical assist however continues to demonstrates left knee instability requiring therapist to block LLE to prevent knee buckling. LLE uncontrolled during swing phase of gait and requires assist to advance when fatigued. Requires manual cues for hip extension and upright posture during gait training. Continues to be appropriate for CIR.   Follow Up Recommendations  CIR     Equipment Recommendations  None recommended by PT    Recommendations for Other Services       Precautions / Restrictions Precautions Precautions: Fall Restrictions Weight Bearing Restrictions: No    Mobility  Bed Mobility Overal bed mobility: Needs Assistance Bed Mobility: Rolling;Sidelying to Sit;Sit to Sidelying Rolling: Min guard Sidelying to sit: Min guard;HOB elevated     Sit to sidelying: Min guard;HOB elevated General bed mobility comments: Use of rails.   Transfers Overall transfer level: Needs assistance Equipment used: 1 person hand held assist Transfers: Sit to/from UGI CorporationStand;Stand Pivot Transfers Sit to Stand: Min assist Stand pivot transfers: Min assist       General transfer comment: SPT bed to/from chair x1 Min A. Min A to stand from chair x2, from EOB x1 with cues for hand placement, anterior translation and technique. Left knee instability noted.   Ambulation/Gait Ambulation/Gait  assistance: Min assist Ambulation Distance (Feet): 40 Feet (x2 bouts)   Gait Pattern/deviations: Step-through pattern;Narrow base of support;Decreased stride length;Decreased step length - left;Decreased step length - right   Gait velocity interpretation: Below normal speed for age/gender General Gait Details: Manual/verbal cues for hip extension, upright posture and wide bOS. Able to advance LLE today however required assist when fatigued. Uncontolled LLE during swing phase. Manual cues for left knee stability during stance phase. therapist provided manual cues for upright posture,   Stairs            Wheelchair Mobility    Modified Rankin (Stroke Patients Only) Modified Rankin (Stroke Patients Only) Pre-Morbid Rankin Score: No symptoms Modified Rankin: Moderately severe disability     Balance Overall balance assessment: Needs assistance Sitting-balance support: Feet supported;No upper extremity supported Sitting balance-Leahy Scale: Fair Sitting balance - Comments: Able to adjust socks reachin outside BoS without LOB.   Standing balance support: During functional activity Standing balance-Leahy Scale: Poor                      Cognition Arousal/Alertness: Awake/alert Behavior During Therapy: WFL for tasks assessed/performed Overall Cognitive Status: Within Functional Limits for tasks assessed                      Exercises      General Comments        Pertinent Vitals/Pain Pain Assessment: No/denies pain    Home Living                      Prior Function  PT Goals (current goals can now be found in the care plan section) Progress towards PT goals: Progressing toward goals    Frequency  Min 4X/week    PT Plan Current plan remains appropriate    Co-evaluation             End of Session Equipment Utilized During Treatment: Gait belt Activity Tolerance: Patient tolerated treatment well Patient left: in  bed;with call bell/phone within reach;with bed alarm set     Time: 1334-1350 PT Time Calculation (min) (ACUTE ONLY): 16 min  Charges:  $Gait Training: 8-22 mins                    G CodesAlvie Heidelberg A 30-Jul-2014, 2:56 PM  Alvie Heidelberg, PT, DPT [phone]

## 2014-07-04 NOTE — Progress Notes (Signed)
Rehab admissions - I am following pt's case and we did receive insurance approval for inpatient rehab. I spoke with Dr. Blake DivineAkula who gave medical clearance. Rehab bed is available and we will admit pt to inpatient later today.  I spoke with pt who is agreeable to plan. I will complete admission paperwork with pt later this afternoon.  I called and updated pt's RN, Steward DroneBrenda, case Production designer, theatre/television/filmmanager and Dysheka, Child psychotherapistsocial worker.  Thanks.  Juliann MuleJanine Zula Hovsepian, PT Rehabilitation Admissions Coordinator [phone]

## 2014-07-04 NOTE — Consult Note (Signed)
ELECTROPHYSIOLOGY CONSULT NOTE  Patient ID: Tracy [name] MRN: [id number], DOB/AGE: May 06, 1926   Admit date: 07/01/2014 Date of Consult: 07/04/2014  Primary Physician: No primary care provider on file. Primary Cardiologist: Shenica Holzheimer Reason for Consultation: Cryptogenic stroke; recommendations regarding Implantable Loop Recorder  History of Present Illness Kem Parcher Copado was admitted on 07/01/2014 with acute CVA. she has been monitored on telemetry which has demonstrated no arrhythmias. She has a thalamic stroke which is felt to be due to small vessel ischemic rather than embolism.  She has chronic sinus bradycardia for which she has done very well, without symptoms.   EP has been asked to evaluate for placement of an implantable loop recorder to monitor for atrial fibrillation.  Past Medical History Past Medical History  Diagnosis Date  . Stroke   . Hypertension     Past Surgical History Past Surgical History  Procedure Laterality Date  . Carotid endarterectomy      Allergies/Intolerances No Known Allergies Inpatient Medications . aspirin EC  81 mg Oral Daily  . atorvastatin  10 mg Oral q1800  . clopidogrel  75 mg Oral Daily  . fenofibrate  160 mg Oral Daily  . latanoprost  1 drop Both Eyes QHS  . pentoxifylline  400 mg Oral Daily  . polyethylene glycol  17 g Oral Daily     Social History History   Social History  . Marital Status: Widowed    Spouse Name: N/A  . Number of Children: 5  . Years of Education: N/A   Occupational History  . Not on file.   Social History Main Topics  . Smoking status: Never Smoker   . Smokeless tobacco: Not on file  . Alcohol Use: No  . Drug Use: No  . Sexual Activity: Not on file   Other Topics Concern  . Not on file   Social History Narrative    Review of Systems General: No chills, fever, night sweats or weight changes  Cardiovascular:  No chest pain, dyspnea on exertion, edema, orthopnea, palpitations, paroxysmal  nocturnal dyspnea Dermatological: No rash, lesions or masses Respiratory: No cough, dyspnea Urologic: No hematuria, dysuria Abdominal: No nausea, vomiting, diarrhea, bright red blood per rectum, melena, or hematemesis Neurologic: No visual changes, weakness, changes in mental status All other systems reviewed and are otherwise negative except as noted above.  Physical Exam Blood pressure 163/57, pulse 57, temperature 98 F (36.7 C), temperature source Oral, resp. rate 20, height 4\' 11"  (1.499 m), weight 116 lb (52.617 kg), SpO2 99 %.  General: Well developed, well appearing 81 y.o. female in no acute distress. HEENT: Normocephalic, atraumatic. EOMs intact. Sclera nonicteric. Oropharynx clear.  Neck: Supple without bruits. No JVD. Lungs: Respirations regular and unlabored, CTA bilaterally. No wheezes, rales or rhonchi. Heart: RRR. S1, S2 present. No murmurs, rub, S3 or S4. Abdomen: Soft, non-tender, non-distended. BS present x 4 quadrants. No hepatosplenomegaly.  Extremities: No clubbing, cyanosis or edema. DP/PT/Radials 2+ and equal bilaterally. Psych: Normal affect. Neuro: Alert and oriented X 3. Moves all extremities spontaneously. Musculoskeletal: No kyphosis. Skin: Intact. Warm and dry. No rashes or petechiae in exposed areas.   Labs Lab Results  Component Value Date   WBC 5.4 07/03/2014   HGB 12.5 07/03/2014   HCT 38.9 07/03/2014   MCV 92.0 07/03/2014   PLT 307 07/03/2014    Recent Labs Lab 07/01/14 1020  07/03/14 0520  NA 142  < > 141  K 4.6  < > 4.2  CL 109  < >  112  CO2 23  < > 23  BUN 28*  < > 16  CREATININE 1.31*  < > 0.95  CALCIUM 10.2  < > 8.9  PROT 6.7  --   --   BILITOT 1.0  --   --   ALKPHOS 28*  --   --   ALT 13  --   --   AST 36  --   --   GLUCOSE 107*  < > 103*  < > = values in this interval not displayed.  Recent Labs  07/01/14 1020  INR 1.18    Radiology/Studies Ct Head (brain) Wo Contrast  07/01/2014   CLINICAL DATA:  Left-sided numbness  and weakness since yesterday.  EXAM: CT HEAD WITHOUT CONTRAST  TECHNIQUE: Contiguous axial images were obtained from the base of the skull through the vertex without intravenous contrast.  COMPARISON:  None.  FINDINGS: There is no evidence of mass effect, midline shift, or extra-axial fluid collections. There is no evidence of a space-occupying lesion or intracranial hemorrhage. There is no evidence of a cortical-based area of acute infarction. There are bilateral old parieto-occipital lobe infarcts. There is an old left cerebellar infarct. There is a small left lacunar sub insular infarct. There is generalized cerebral atrophy. There is periventricular white matter low attenuation likely secondary to microangiopathy.  The ventricles and sulci are appropriate for the patient's age. The basal cisterns are patent.  Visualized portions of the orbits are unremarkable. The visualized portions of the paranasal sinuses and mastoid air cells are unremarkable. Cerebrovascular atherosclerotic calcifications are noted.  The osseous structures are unremarkable.  IMPRESSION: 1. No acute intracranial pathology. 2. Chronic microvascular disease and cerebral atrophy. 3. Bilateral old parieto-occipital lobe infarcts and left cerebellar infarct.   Electronically Signed   By: Elige Ko   On: 07/01/2014 11:11   Mr Brain Wo Contrast  07/01/2014   CLINICAL DATA:  Left-sided weakness and numbness.  Acute onset.  EXAM: MRI HEAD WITHOUT CONTRAST  MRA HEAD WITHOUT CONTRAST  TECHNIQUE: Multiplanar, multiecho pulse sequences of the brain and surrounding structures were obtained without intravenous contrast. Angiographic images of the head were obtained using MRA technique without contrast.  COMPARISON:  CT scan same day.  FINDINGS: MRI HEAD FINDINGS  The study suffers from motion degradation. There is a 1 cm acute infarction in the right lateral thalamus. No evidence of hemorrhage or mass effect. Elsewhere, there are old cerebellar  infarctions an old bilateral occipital infarctions. There chronic small vessel infarctions within the thalami, basal ganglia and hemispheric deep white matter.  MRA HEAD FINDINGS  Very motion limited. Both internal carotid arteries show antegrade flow. Flow is present at least proximally within the anterior and middle cerebral arteries. No antegrade flow seen in the right vertebral artery. Left vertebral artery is patent to the basilar. Flow is present in both PCAs proximally.  IMPRESSION: Severely motion degraded exam. 1 cm acute infarction in the right lateral thalamus.  Extensive old ischemic changes elsewhere throughout the brain as outlined above.  Motion degraded MR angiogram. Major vessels all show flow except for the right vertebral artery.   Electronically Signed   By: Paulina Fusi M.D.   On: 07/01/2014 20:57   Dg Chest Port 1 View  07/01/2014   CLINICAL DATA:  Bradycardia  EXAM: PORTABLE CHEST - 1 VIEW  COMPARISON:  None.  FINDINGS: There are scattered calcified granulomas throughout the lungs bilaterally. There is no edema or consolidation. Heart is mildly enlarged with pulmonary  vascularity within normal limits. No adenopathy. There is atherosclerotic change in aorta. There is sclerosis in the proximal right humerus.  IMPRESSION: Multiple granulomas. No edema or consolidation. Mild cardiomegaly. Incomplete visualization of sclerosis in the right proximal humerus. This area may represent bone infarct. It may be prudent to consider dedicated right humerus radiographs to further evaluate this region.   Electronically Signed   By: Bretta BangWilliam  Woodruff III M.D.   On: 07/01/2014 12:13   Mr Maxine GlennMra Head/brain Wo Cm  07/01/2014   CLINICAL DATA:  Left-sided weakness and numbness.  Acute onset.  EXAM: MRI HEAD WITHOUT CONTRAST  MRA HEAD WITHOUT CONTRAST  TECHNIQUE: Multiplanar, multiecho pulse sequences of the brain and surrounding structures were obtained without intravenous contrast. Angiographic images of the  head were obtained using MRA technique without contrast.  COMPARISON:  CT scan same day.  FINDINGS: MRI HEAD FINDINGS  The study suffers from motion degradation. There is a 1 cm acute infarction in the right lateral thalamus. No evidence of hemorrhage or mass effect. Elsewhere, there are old cerebellar infarctions an old bilateral occipital infarctions. There chronic small vessel infarctions within the thalami, basal ganglia and hemispheric deep white matter.  MRA HEAD FINDINGS  Very motion limited. Both internal carotid arteries show antegrade flow. Flow is present at least proximally within the anterior and middle cerebral arteries. No antegrade flow seen in the right vertebral artery. Left vertebral artery is patent to the basilar. Flow is present in both PCAs proximally.  IMPRESSION: Severely motion degraded exam. 1 cm acute infarction in the right lateral thalamus.  Extensive old ischemic changes elsewhere throughout the brain as outlined above.  Motion degraded MR angiogram. Major vessels all show flow except for the right vertebral artery.   Electronically Signed   By: Paulina FusiMark  Shogry M.D.   On: 07/01/2014 20:57   Dg Hip Unilat With Pelvis 2-3 Views Left  07/02/2014   CLINICAL DATA:  81 year old female with 2 day history of left-sided hip pain.  EXAM: LEFT HIP (WITH PELVIS) 2-3 VIEWS  COMPARISON:  No priors.  FINDINGS: AP view of the bony pelvis demonstrates no acute displaced fracture of the bony pelvic ring. Bilateral proximal femurs as visualized are intact, and the left femoral head is properly located. Mild degenerative changes of osteoarthritis in the hip joints bilaterally. Extensive atherosclerosis, with vascular stents noted in the right common iliac artery, and the left common and external iliac artery.  IMPRESSION: 1. No acute radiographic abnormality of the bony pelvis or the left hip. 2. Mild bilateral hip joint osteoarthritis.   Electronically Signed   By: Trudie Reedaniel  Entrikin M.D.   On: 07/02/2014  19:24    Echocardiogram  reviewed  12-lead ECG sinus rhythm with sinus bradycardia Telemetry sinus rhythm with pacs, no afib   Assessment and Plan 1. Stroke  I have spoken with neurology who feels that stroke is due to small vessel disease and not embolism.  There is therefore no indication for ILR implant at this time. Though she carries a diagnosis of afib, I have reviewed all ekgs in epic and do not see afib.  There is a single ekg from 05/2012 which revealed sinus with PACs which the computer inappropriately read as afib. Though a 30 day monitor is reasonable, given her advanced age, I would not advise any additional monitoring at this time.  2. Sinus bradycardia Asymptomatic No indication for any further workup or management.  Electrophysiology team to see as needed while here. Please call with questions.  Randolm Idol 07/04/2014, 10:14 AM

## 2014-07-04 NOTE — Progress Notes (Signed)
Pt arrived to unit at 1500 with family at bedside. Reviewed rehab booklet, process and safety plan with verbal understanding. Call bell within reach, family at bedside, SRx3, bed alarm on. Pt denies pain at this time

## 2014-07-04 NOTE — PMR Pre-admission (Signed)
PMR Admission Coordinator Pre-Admission Assessment  Patient: Tracy [name] is an 81 y.o., female MRN: [id number] DOB: Dec 22, 1926 Height: $RemoveBefo'4\' 11"'gfHbssHzOSV$  (149.9 cm) Weight: 52.617 kg (116 lb)              Insurance Information HMO: yes    PPO:      PCP:      IPA:      80/20:      OTHER:  PRIMARY: [employer] Medicare      Policy#: [id number]      Subscriber: self CM Name: Anette Guarneri      Phone#: [phone], ext [phone]     Fax#: [phone] Approval for CIR given on 07-04-14 from [place] for seven days. Follow up will be with onsite reviewer Gaylan Gerold, RN (phone: [phone]). Pre-Cert#: [id number]      Employer: retired Benefits:  Phone #: [phone]     Name: per [place]. Date: 04-05-14     Deduct: none      Out of Pocket Max: [phone] (none met)      Life Max: unlimited CIR: $430 copay/day for days 1-4, pre-auth needed      SNF: $0/day for days 1-20; $160/day for days 21-62; $0/day for days 63-100 (100 day visit max, pre-auth needed) Outpatient: covered     Co-Pay: $40 copay, no visit limits Home Health: 100%      Co-Pay: none, no visit limits DME: 80%     Co-Pay: 20% Providers: in network  Emergency Contact Information Contact Information    Name Relation Home Work [place] Son [phone]  [phone]   Lilu, [place]   [phone]   Hayes,Cathy Daughter   [phone]     Current Medical History  Patient Admitting Diagnosis: right thalamic infarction with profound left hemisensory deficits (esp left leg) and left hemiparesis  History of Present Illness: Tracy [name] is a 81 y.o. 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 Family member  insisted on bringing her to ED for evaluation and patient was found to be bradycardic with HR 40-50s. CT head showed chronic bilateral  occipital and left cerebellar infarcts and negative for acute changes. MRI/MRA brain done revealing 1 cm acute infarction in the right lateral thalamus, extensive old ischemic changes throughout the brain and no antegrade flow in R-VA.  2D echo with EF 60-655 with mild mitral calcification and moderately dilated right atrium.  Carotid dopplers without significant ICA stenosis. Pelvic films done due to complaints of left hip pain and revealed mild bilateral hip OA. Patient continues to have bouts of bradycardia but has refused PPM in the past. Dr. 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   NIH Total: 6  Past Medical History  Past Medical History  Diagnosis Date  . Stroke   . Hypertension     Family History  family history includes Cancer in her mother.  Prior Rehab/Hospitalizations: pt has had outpt PT for R shoulder rotator cuff surgery.   Current Medications   Current facility-administered medications:  .  aspirin EC tablet 81 mg, 81 mg, Oral, Daily, Melton Alar, PA-C, 81 mg at 07/04/14 0949 .  atorvastatin (LIPITOR) tablet 10 mg, 10 mg,  Oral, q1800, Rosalin Hawking, MD, 10 mg at 07/03/14 1736 .  benazepril (LOTENSIN) tablet 40 mg, 40 mg, Oral, Daily, Hosie Poisson, MD, 40 mg at 07/04/14 1235 .  clopidogrel (PLAVIX) tablet 75 mg, 75 mg, Oral, Daily, Melton Alar, PA-C, 75 mg at 07/04/14 0949 .  fenofibrate tablet 160 mg, 160 mg, Oral, Daily, Melton Alar, PA-C, 160 mg at 07/04/14 0949 .  hydrALAZINE (APRESOLINE) injection 10 mg, 10 mg, Intravenous, Q6H PRN, Melton Alar, PA-C, 10 mg at 07/04/14 6644 .  HYDROcodone-acetaminophen (NORCO/VICODIN) 5-325 MG per tablet 1 tablet, 1 tablet, Oral, Q6H PRN, Bobby Rumpf York, PA-C .  latanoprost (XALATAN) 0.005 % ophthalmic solution 1 drop, 1 drop, Both Eyes, QHS, Marianne L York, PA-C, 1 drop at  07/03/14 2100 .  pentoxifylline (TRENTAL) CR tablet 400 mg, 400 mg, Oral, Daily, Melton Alar, PA-C, 400 mg at 07/04/14 0949 .  polyethylene glycol (MIRALAX / GLYCOLAX) packet 17 g, 17 g, Oral, Daily, Melton Alar, PA-C, 17 g at 07/04/14 0949 .  senna-docusate (Senokot-S) tablet 1 tablet, 1 tablet, Oral, QHS PRN, Melton Alar, PA-C  Patients Current Diet: Diet Heart Room service appropriate?: Yes; Fluid consistency:: Thin Diet - low sodium heart healthy  Precautions / Restrictions Precautions Precautions: Fall Restrictions Weight Bearing Restrictions: No   Prior Activity Level Community (5-7x/wk): Pt was very independent prior to CVA and is motivated to get home and back to her baseline. She drove and enjoyred running errands each day. She and her son share household duties (son Jonathon Bellows lives with her).  Pt is retired from [employer].   Home Assistive Devices / Equipment Home Equipment: None  Prior Functional Level Prior Function Level of Independence: Independent Comments: Pt drives.   Current Functional Level Cognition  Overall Cognitive Status: Within Functional Limits for tasks assessed Orientation Level: Oriented X4    Extremity Assessment (includes Sensation/Coordination)  Upper Extremity Assessment: LUE deficits/detail LUE Deficits / Details: Increased weakness in shoulder. Elbow>distally are [facility]. Decreased hand/eye coordination and decreased fine motor coordination and control noted LUE Sensation: decreased light touch (distal to wrist > finger tips) LUE Coordination: decreased fine motor, decreased gross motor  Lower Extremity Assessment: Defer to PT evaluation LLE Deficits / Details: Difficult to assess due to pain with AROM (from fall). Grossly ~2+-3/5 throughout hip, knee, ankle. LLE Sensation: decreased light touch (foot) LLE Coordination:  (Difficult to assess secondary to pain with movement.)    ADLs  Overall ADL's : Needs  assistance/impaired Eating/Feeding: Minimal assistance, Sitting Eating/Feeding Details (indicate cue type and reason): using L hand as gross motor component  Grooming: Minimal assistance, Sitting Upper Body Bathing: Minimal assitance, Sitting Lower Body Bathing: Moderate assistance, Sit to/from stand, Cueing for safety Upper Body Dressing : Minimal assistance, Sitting Lower Body Dressing: Moderate assistance, Sit to/from stand, Cueing for safety Toilet Transfer: Moderate assistance, BSC, Stand-pivot Toileting- Clothing Manipulation and Hygiene: Minimal assistance, Cueing for safety, Sit to/from stand General ADL Comments: Patient able to reach BLEs for LB ADLs. Patient required mod assist for sit<>stands and functional transfers. Patient with decreased coordination and sensation > LUE and therapist encouraged patient to use eyes to compensate at this time. Also encouraged patient to functionally use LUE as much as possible to regain strengh and function.     Mobility  Overal bed mobility: Needs Assistance Bed Mobility: Rolling, Sidelying to Sit Rolling: Min guard Sidelying to sit: Mod assist Sit to supine: Min guard General bed mobility comments: Patient seated in  recliner upon entering room, please see PT note for more information regarding bed mobility    Transfers  Overall transfer level: Needs assistance Equipment used: 1 person hand held assist Transfers: Sit to/from Stand, Stand Pivot Transfers Sit to Stand: Mod assist Stand pivot transfers: Mod assist General transfer comment: sit<>stand from recliner and stand pivot transfer recliner <> BSC. Cues needed for safety and technique. Left knee tended to buckle during weight bearing.     Ambulation / Gait / Stairs / Wheelchair Mobility  Ambulation/Gait Ambulation/Gait assistance: Mod assist, +2 physical assistance Ambulation Distance (Feet): 40 Feet (x2 bouts) Assistive device: 1 person hand held assist Gait Pattern/deviations:  Step-through pattern, Decreased stride length, Decreased step length - left, Decreased step length - right, Decreased weight shift to left, Narrow base of support Gait velocity interpretation: Below normal speed for age/gender General Gait Details: Manual/verbal cues for hip extension, upright posture and wide bOS. Assist to advance LLE and for left knee stability as pt with left knee buckling without therapist blocking left knee during stance phase of gait. Fatigues quickly.  2 seated rest breaks.    Posture / Balance Balance Overall balance assessment: Needs assistance Sitting-balance support: Bilateral upper extremity supported, Feet supported Sitting balance-Leahy Scale: Fair Standing balance support: No upper extremity supported, During functional activity Standing balance-Leahy Scale: Poor Standing balance comment: Requires external support for balance in standing with Mod manual cues for hip extension and thoracic/lumbar extension. Increased sway noted ins tanding with left knee buckling without support.     Special needs/care consideration BiPAP/CPAP no CPM no  Continuous Drip IV no  Dialysis no         Life Vest no  Oxygen no  Special Bed no  Trach Size no  Wound Vac (area) no       Skin - pt states she has a sore knot on her stomach from recent injections                         Bowel mgmt: last BM on 06-30-14 Bladder mgmt: currently using bedpan  Diabetic mgmt no   Previous Home Environment Living Arrangements: Children (son) Available Help at Discharge: Family, Available 24 hours/day Type of Home: House Home Layout: One level Home Access: Level entry Bathroom Shower/Tub: Tub/shower unit, Architectural technologist: Standard Home Care Services: No  Discharge Living Setting Plans for Discharge Living Setting: Patient's home, House Type of Home at Discharge: House Discharge Home Layout: One level Discharge Home Access: Level entry Discharge Bathroom Shower/Tub:  Tub/shower unit Discharge Bathroom Toilet: Standard Does the patient have any problems obtaining your medications?: No  Social/Family/Support Systems Patient Roles: Other (Comment) (lives with her son, active at baseline) Contact Information: son Jonathon Bellows is primary contact (he lives with her) Anticipated Caregiver: son Jonathon Bellows Anticipated Ambulance person Information: see above Ability/Limitations of Caregiver: no limitations from [employer] Caregiver Availability: 24/7 Discharge Plan Discussed with Primary Caregiver: Yes Is Caregiver In Agreement with Plan?: Yes Does Caregiver/Family have Issues with Lodging/Transportation while Pt is in Rehab?: No  Goals/Additional Needs Patient/Family Goal for Rehab: Supervision and Minimal assistance with PT/OT; Mod Ind with SLP Expected length of stay: 11-16 days Cultural Considerations: none Dietary Needs: heart healthy, thin liquids Equipment Needs: to be determined Pt/Family Agrees to Admission and willing to participate: Yes (spoke with pt and two sons on 07-03-14) Program Orientation Provided & Reviewed with Pt/Caregiver Including Roles  & Responsibilities: Yes   Decrease burden of Care through IP  rehab admission: NA   Possible need for SNF placement upon discharge: not anticipated   Patient Condition: This patient's condition remains as documented in the consult dated 07-03-14, in which the Rehabilitation Physician determined and documented that the patient's condition is appropriate for intensive rehabilitative care in an inpatient rehabilitation facility. Will admit to inpatient rehab today.  Preadmission Screen Completed By: Nanetta Batty, PT, 07/04/2014 1:01 PM ______________________________________________________________________   Discussed status with Dr. Naaman Plummer on 07-04-14 at 1245 and received telephone approval for admission today.  Admission Coordinator:  Nanetta Batty, PT, time 1245/Date 07-04-14

## 2014-07-05 ENCOUNTER — Inpatient Hospital Stay (HOSPITAL_COMMUNITY): Payer: Medicare Other | Admitting: Physical Therapy

## 2014-07-05 ENCOUNTER — Inpatient Hospital Stay (HOSPITAL_COMMUNITY): Payer: Medicare Other | Admitting: Occupational Therapy

## 2014-07-05 ENCOUNTER — Inpatient Hospital Stay (HOSPITAL_COMMUNITY): Payer: Medicare Other | Admitting: Speech Pathology

## 2014-07-05 DIAGNOSIS — I10 Essential (primary) hypertension: Secondary | ICD-10-CM

## 2014-07-05 DIAGNOSIS — I495 Sick sinus syndrome: Secondary | ICD-10-CM

## 2014-07-05 NOTE — Telephone Encounter (Signed)
Social worker called from the hospital stated pt is in the hospital and not sure when she is getting out.

## 2014-07-05 NOTE — Progress Notes (Addendum)
Occupational Therapy Assessment and Plan  Patient Details  Name: Tracy [name] MRN: [id number] Date of Birth: Dec 21, 1926  OT Diagnosis: muscle weakness (generalized) and hemiparesis of non dominant hand Rehab Potential: Rehab Potential (ACUTE ONLY): Good ELOS: 7-10 days   Today's Date: 07/05/2014 OT Individual Time: 0900-1000 OT Individual Time Calculation (min): 60 min     Problem List:  Patient Active Problem List   Diagnosis Date Noted  . Hip pain, acute   . Essential hypertension   . 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 Bradycardia 07/01/2014  . Elevated troponin 07/01/2014  . Dysuria 07/01/2014  . Constipation 07/01/2014  . Stroke 07/01/2014  . Right ankle pain 01/29/2014  . Rotator cuff tear arthropathy of right shoulder 06/19/2013  . Left rotator cuff tear arthropathy 05/17/2013  . Fatigue 02/15/2012  . Aneurysm of iliac artery 01/26/2012  . Peripheral vascular disease, unspecified 01/26/2012  . Left shoulder pain 02/09/2011  . Preventative health care 08/02/2010  . BRADYCARDIA 05/29/2010  . SICK SINUS SYNDROME 03/10/2010  . CAROTID STENOSIS 03/10/2010  . PVD WITH CLAUDICATION 02/23/2010  . CONSTIPATION 10/01/2008  . NECK MASS 10/01/2008  . Diabetes 08/01/2007  . DIVERTICULOSIS, COLON 08/01/2007  . SHOULDER PAIN, RIGHT 08/01/2007  . COLONIC POLYPS, HX OF 08/01/2007  . GERD 12/25/2006  . OSTEOPENIA 12/25/2006  . CEREBROVASCULAR ACCIDENT, HX OF 12/25/2006  . Hyperlipidemia 11/29/2006  . Essential hypertension 11/29/2006    Past Medical History:  Past Medical History  Diagnosis Date  . Hyperlipidemia   . HTN (hypertension)   . GERD (gastroesophageal reflux disease)   . CVA (cerebrovascular accident) 2002  . Osteopenia   . Hx of adenomatous colonic polyps 2007  . Diverticulosis of colon   . DM (diabetes mellitus), type 2   . PVD (peripheral vascular disease)     recently dx w/ carotid -left  int carotid >90%  50-60% rt int carotid 02/2010  . PVD (peripheral vascular disease)     lf  ABI 0.11 and rt 0.43  . CAROTID STENOSIS   . DIABETES MELLITUS, TYPE II   . DIVERTICULOSIS, COLON   . GERD   . HYPERLIPIDEMIA   . OSTEOPENIA   . Atrial fibrillation   . DVT (deep venous thrombosis)   . Sinus node dysfunction 2011  . Stroke   . Hypertension    Past Surgical History:  Past Surgical History  Procedure Laterality Date  . Cns vascular stent per neurosurgeon    . Tubal ligation    . Right shoulder surgery  2010  . Cataract eye surgery    . Lazer eye surgery    . Iliac artery stent  2011    Bilateral CIA stent  . Carotid endarterectomy      Assessment & Plan Clinical Impression: Patient is a 81 y.o. year old female withhistory of HTN, SSS, mini-stroke 2002 with STM loss; who presented to ED with complaints of progressive left sided numbness left sided numbness and weakness with subsequent fall and difficulty walking by am of 07/01/14. Family member insisted on bringing her to ED for evaluation and patient was found to be bradycardic with HR 40-50s. CT head showed chronic bilateral occipital and left cerebellar infarcts and negative for acute changes. MRI/MRA brain done revealing 1 cm acute infarction in the right lateral thalamus, extensive old ischemic changes throughout the brain and no antegrade flow in R-VA. 2D echo with EF 60-655 with mild mitral calcification and moderately dilated right atrium. Carotid  dopplers without significant ICA stenosis. Pelvic films done due to complaints of left hip pain and revealed mild bilateral hip OA. Patient continues to have bouts of bradycardia but has refused PPM in the past. Dr. Erlinda Hong felt that patient had thrombotic stroke due to SVD and recommends compliance with antiplatelet medications (ASA, plavix, trental). Loop recorder not needed and no additional monitoring at this time per consult by Dr. Rayann Heman.  Patient transferred to CIR on 07/04/2014 .    Patient  currently requires min with basic self-care skills secondary to muscle weakness, decreased coordination and decreased sitting balance and decreased standing balance.  Prior to hospitalization, patient could complete ADLs and IADLs with independent .  Patient will benefit from skilled intervention to increase independence with basic self-care skills and increase level of independence with iADL prior to discharge home with care partner.  Anticipate patient will require 24 hour supervision and follow up outpatient.  OT - End of Session Endurance Deficit: No   Skilled Therapeutic Intervention Upon entering the room, pt supine in bed with no c/o pain this session. OT educated pt and family members of OT purpose, OT POC, and goals while in inpatient rehabilitation. Pt declined shower and reports not wishing to take shower while participating in therapy and wishes for only sink bathing. Supine >sit with supervision and bathing while seated on EOB with SBA. Pt utilizing both hands in bilateral coordination dressing tasks. Pt sat in circle sitting on bed to donn shoes and was able to tie shoe laces as well. Mod A stand pivot from bed >wheelchair. Seated at sink for grooming tasks with set up A. Pt seated in wheelchair with call bell within reach and family re-entering the room upon exiting.    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 Also noted decreased pt attention to L visual field, as evidenced by poor obstacle negotiation on L side during w/c mobility.  Cognition Overall Cognitive Status: Within Functional Limits for tasks assessed Orientation Level: Oriented X4 Safety/Judgment: Impaired Comments: During functional mobility, pt required mod cueing for safety awareness. Sensation Sensation Light Touch: Impaired Detail Light Touch Impaired Details: Impaired LUE;Impaired LLE Stereognosis: Not tested Hot/Cold: Appears Intact Proprioception: Impaired Detail Proprioception Impaired Details: Impaired LLE;Impaired LUE Motor  Motor Motor: Other (comment) Motor - Skilled Clinical Observations: L hemiparesis Mobility  Bed Mobility Bed Mobility: Supine to Sit;Sit to Supine Supine to Sit: HOB flat;5: Supervision Supine to Sit Details (indicate cue type and reason): Noted decreased attention to LLE during bed mobility. Sit to Supine: HOB flat;5: Supervision Sit to Supine - Details (indicate cue type and reason):  Noted decreased attention to LLE during bed mobility. Transfers Sit to Stand: 4: Min assist;3: Mod assist;With armrests;From chair/3-in-1 Sit to Stand Details: Tactile cues for weight beaing;Tactile cues for weight shifting;Verbal cues for technique;Verbal cues for precautions/safety Sit to Stand Details (indicate cue type and reason): \ Stand to Sit:  With armrests;To chair/3-in-1;4: Min assist Stand to Sit Details (indicate cue type and reason): Verbal cues for precautions/safety;Tactile cues for weight shifting;Tactile cues for weight beaing  Trunk/Postural Assessment  Cervical Assessment Cervical Assessment: Within Functional Limits Thoracic Assessment Thoracic Assessment: Within Functional Limits Lumbar Assessment Lumbar Assessment: Within Functional Limits Postural Control Postural Control: Deficits on evaluation Postural Limitations: In seated, pt exhibits L hip adduction/internal rotation, passive elongation of trunk on R side. In standing, pt demonstrates minimal LLE weightbearing.  Balance Balance Balance Assessed: Yes Dynamic Sitting Balance Dynamic Sitting - Balance Support: No upper extremity supported;Feet supported Dynamic Sitting - Level of Assistance: 5: Stand by assistance Sitting balance - Comments: donning B socks and shoes  Static Standing Balance Static Standing - Balance Support: Right upper extremity supported Static Standing - Level of Assistance: 4: Min assist Dynamic Standing Balance Dynamic Standing - Balance Support: Right upper extremity supported Dynamic Standing - Level of Assistance: 3: Mod assist Extremity/Trunk Assessment [name] Assessment [name] Assessment: Within Functional Limits (3+/5 throughout) LUE Assessment LUE Assessment: Within Functional Limits (3-/5 shoulder and 3+/5 elbow,wrist, and digits)  FIM:  FIM - Grooming Grooming Steps: Wash, rinse, dry face;Wash, rinse, dry hands;Oral care, brush teeth, clean dentures;Brush, comb hair Grooming: 5: Set-up assist to obtain items FIM - Bathing Bathing Steps Patient Completed: Chest;Right Arm;Left Arm;Abdomen;Front perineal area;Right upper leg;Left upper leg;Right lower leg (including foot);Left lower leg (including foot) Bathing: 4: Min-Patient completes 8-9 [id number] parts or 75+ percent FIM - Upper Body Dressing/Undressing Upper body  dressing/undressing steps patient completed: Thread/unthread right bra strap;Thread/unthread left bra strap;Thread/unthread right sleeve of pullover shirt/dresss;Thread/unthread left sleeve of pullover shirt/dress;Put head through opening of pull over shirt/dress;Pull shirt over trunk Upper body dressing/undressing: 4: Min-Patient completed 75 plus % of tasks FIM - Lower Body Dressing/Undressing Lower body dressing/undressing steps patient completed: Thread/unthread right underwear leg;Thread/unthread left underwear leg;Thread/unthread right pants leg;Thread/unthread left pants leg;Don/Doff right sock;Don/Doff left sock;Don/Doff right shoe;Don/Doff left shoe;Fasten/unfasten right shoe;Fasten/unfasten left shoe Lower body dressing/undressing: 4: Min-Patient completed 75 plus % of tasks FIM - BControl and instrumentation engineerDevices: Arm rests Bed/Chair Transfer: 3: Chair or W/C > Bed: Mod A (lift or lower assist);4: Chair or W/C > Bed: Min A (steadying Pt. > 75%);3: Bed > Chair or W/C: Mod A (lift or lower assist);4: Bed > Chair or W/C: Min A (steadying Pt. > 75%);5: Supine > Sit: Supervision (verbal cues/safety issues);5: Sit > Supine: Supervision (verbal cues/safety issues)   Refer to Care Plan for Long Term Goals  Recommendations for other services: None  Discharge Criteria: Patient will be discharged from OT if patient refuses treatment 3 consecutive times without medical reason, if treatment goals not met, if there is a change in medical status, if patient makes no progress towards goals or if patient is discharged from hospital.  The above assessment, treatment plan, treatment alternatives and goals were discussed and mutually agreed upon: by patient  PPhineas Semen4/04/2014, 8:33 PM

## 2014-07-05 NOTE — Evaluation (Signed)
Physical Therapy Assessment and Plan  Patient Details  Name: Tracy [name] MRN: [id number] Date of Birth: 25-Oct-1926  PT Diagnosis: Abnormal posture, Abnormality of gait, Hemiparesis non-dominant, Impaired sensation and Muscle weakness Rehab Potential: Good ELOS: 8-12 days   Today's Date: 07/05/2014 PT Individual Time: 1445-1610 PT Individual Time Calculation (min): 85 min    Problem List:  Patient Active Problem List   Diagnosis Date Noted  . Hip pain, acute   . Essential hypertension   . 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 Bradycardia 07/01/2014  . Elevated troponin 07/01/2014  . Dysuria 07/01/2014  . Constipation 07/01/2014  . Stroke 07/01/2014  . Right ankle pain 01/29/2014  . Rotator cuff tear arthropathy of right shoulder 06/19/2013  . Left rotator cuff tear arthropathy 05/17/2013  . Fatigue 02/15/2012  . Aneurysm of iliac artery 01/26/2012  . Peripheral vascular disease, unspecified 01/26/2012  . Left shoulder pain 02/09/2011  . Preventative health care 08/02/2010  . BRADYCARDIA 05/29/2010  . SICK SINUS SYNDROME 03/10/2010  . CAROTID STENOSIS 03/10/2010  . PVD WITH CLAUDICATION 02/23/2010  . CONSTIPATION 10/01/2008  . NECK MASS 10/01/2008  . Diabetes 08/01/2007  . DIVERTICULOSIS, COLON 08/01/2007  . SHOULDER PAIN, RIGHT 08/01/2007  . COLONIC POLYPS, HX OF 08/01/2007  . GERD 12/25/2006  . OSTEOPENIA 12/25/2006  . CEREBROVASCULAR ACCIDENT, HX OF 12/25/2006  . Hyperlipidemia 11/29/2006  . Essential hypertension 11/29/2006   Past Medical History:  Past Medical History  Diagnosis Date  . Hyperlipidemia   . HTN (hypertension)   . GERD (gastroesophageal reflux disease)   . CVA (cerebrovascular accident) 2002  . Osteopenia   . Hx of adenomatous colonic polyps 2007  . Diverticulosis of colon   . DM (diabetes mellitus), type 2   . PVD (peripheral vascular disease)     recently dx w/ carotid -left  int carotid >90%  50-60% rt int carotid 02/2010  . PVD (peripheral vascular disease)     lf  ABI 0.11 and rt 0.43  . CAROTID STENOSIS   . DIABETES MELLITUS, TYPE II   . DIVERTICULOSIS, COLON   . GERD   . HYPERLIPIDEMIA   . OSTEOPENIA   . Atrial fibrillation   . DVT (deep venous thrombosis)   . Sinus node dysfunction 2011  . Stroke   . Hypertension    Past Surgical History:  Past Surgical History  Procedure Laterality Date  . Cns vascular stent per neurosurgeon    . Tubal ligation    . Right shoulder surgery  2010  . Cataract eye surgery    . Lazer eye surgery    . Iliac artery stent  2011    Bilateral CIA stent  . Carotid endarterectomy      Assessment & Plan Clinical Impression: Tracy [name] is a 81 y.o. 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 Family member insisted on bringing her to ED for evaluation and patient was found to be bradycardic with HR 40-50s. CT head showed chronic bilateral occipital and left cerebellar infarcts and negative for acute changes. MRI/MRA brain done revealing 1 cm acute infarction in the right lateral thalamus, extensive old ischemic changes throughout the brain and no antegrade flow in R-VA. 2D echo with EF 60-655 with mild mitral calcification and moderately dilated right atrium. Carotid dopplers  without significant ICA stenosis. Pelvic films done due to complaints of left hip pain and revealed mild bilateral hip OA. Patient continues to have bouts of bradycardia but has refused PPM in the past. Dr. 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 Patient transferred to CIR on 07/04/2014 .   Patient currently requires mod with mobility  secondary to muscle weakness, impaired timing and sequencing, unbalanced muscle activation and decreased coordination, decreased attention to left, decreased safety awareness and decreased standing balance, decreased postural control, decreased balance strategies and L hemiparesis.  Prior to hospitalization, patient was independent  with mobility and lived with Son in a House home.  Home access is  Level entry.  Patient will benefit from skilled PT intervention to maximize safe functional mobility and minimize fall risk for planned discharge home with 24 hour supervision.  Anticipate patient will benefit from follow up OP at discharge.  PT - End of Session Activity Tolerance: Endurance does not limit participation in activity Endurance Deficit: No PT Assessment Rehab Potential (ACUTE/IP ONLY): Good Barriers to Discharge: Other (comment) (None) PT Patient demonstrates impairments in the following area(s): Balance;Motor;Perception;Safety;Sensory;Other (comment) (Inattention to L hemibody and L visual field) PT Transfers Functional Problem(s): Bed Mobility;Bed to Chair;Car;Furniture;Floor PT Locomotion Functional Problem(s): Ambulation;Wheelchair Mobility;Stairs PT Plan PT Intensity: Minimum of 1-2 x/day ,45 to 90 minutes PT Frequency: 5 out of 7 days PT Duration Estimated Length of Stay: 8-12 days PT Treatment/Interventions: Ambulation/gait training;Discharge planning;Balance/vestibular training;DME/adaptive equipment instruction;Functional mobility training;Disease management/prevention;Neuromuscular re-education;Patient/family education;Splinting/orthotics;Therapeutic Activities;Therapeutic Exercise;Visual/perceptual remediation/compensation;UE/LE Coordination activities;UE/LE Strength taining/ROM;Wheelchair propulsion/positioning;Stair training PT Transfers Anticipated Outcome(s): Supervision PT Locomotion Anticipated Outcome(s): Supervision PT Recommendation Recommendations for Other Services:  Other (comment) (None at this time) Follow Up Recommendations: Outpatient PT;24 hour supervision/assistance Patient destination: Home Equipment Recommended: To be determined  Skilled Therapeutic Intervention PT evaluation performed. See below for detailed findings. Treatment initiated. Session focused on gait training, stair negotiation, and controlling L genu recurvatum during dynamic standing. See below for detailed description of assist/cueing required with bed mobility, w/c mobility, gait and stairs. Educated pt/family on findings, goals, and plan of care. Oriented pt and family to rehab unit, fall precautions. Pt/family verbalized understanding of all education and were in full agreement with plan of care. Attempted ACE bandage to control L genu recurvatum during LLE mid to terminal stance phase of gait with minimal improvement. Pt did appear to better control L recurvatum with use of heel wedge and with max verbal/tactile cueing at L hip extensors for activation, LLE attention. Session ended in pt room, where pt was left seated in w/c with family present and all needs within reach.  PT Evaluation Precautions/Restrictions Precautions Precautions: Fall Restrictions Weight Bearing Restrictions: No General   Vital SignsTherapy Vitals Temp: 98.2 F (36.8 C) Temp Source: Oral Pulse Rate: 65 Resp: 18 BP: (!) 148/85 mmHg Patient Position (if appropriate): Lying Oxygen Therapy SpO2: 98 % O2 Device: Not Delivered Pain Pain Assessment Pain Assessment: No/denies pain Home Living/Prior Functioning Home Living Living Arrangements: Children Available Help at Discharge: Family;Available 24 hours/day Type of Home: House Home Access: Level entry Home Layout: One level  Lives With: Son Prior Function Level of Independence: Independent with basic ADLs;Independent with homemaking with ambulation  Able to Take Stairs?: Yes Driving: Yes Vocation: Retired Leisure: Hobbies-yes  (Comment) Comments: bowling, church, plays cards with son Vision/Perception  Perception Comments: Pt requires cueing to attend  to LLE during functional mobility. Also noted decreased pt attention to L visual field, as evidenced by poor obstacle negotiation on L side during w/c mobility.  Cognition Overall Cognitive Status: Within Functional Limits for tasks assessed Orientation Level: Oriented X4 Sensation Sensation Light Touch: Impaired Detail Light Touch Impaired Details: Impaired LUE;Impaired LLE Stereognosis: Not tested Hot/Cold: Appears Intact Proprioception: Impaired Detail Proprioception Impaired Details: Impaired LLE;Impaired LUE Coordination Heel Shin Test: Unable to assess on LLE secondary to pain in anterior thigh (daughter attributing to suspected fall; recent x-ray unremarkable) Motor  Motor Motor: Other (comment) Motor - Skilled Clinical Observations: L hemiparesis  Mobility Bed Mobility Bed Mobility: Supine to Sit;Sit to Supine Supine to Sit: HOB flat;5: Supervision Supine to Sit Details (indicate cue type and reason): Noted decreased attention to LLE during bed mobility. Sit to Supine: HOB flat;5: Supervision Sit to Supine - Details (indicate cue type and reason): Noted decreased attention to LLE during bed mobility. Transfers Transfers: Yes Sit to Stand: 4: Min assist;3: Mod assist;With armrests;From chair/3-in-1 Sit to Stand Details: Tactile cues for weight beaing;Tactile cues for weight shifting;Verbal cues for technique;Verbal cues for precautions/safety Sit to Stand Details (indicate cue type and reason): \ Stand to Sit: With armrests;To chair/3-in-1;4: Min assist Stand to Sit Details (indicate cue type and reason): Verbal cues for precautions/safety;Tactile cues for weight shifting;Tactile cues for weight beaing Stand Pivot Transfers: 4: Min assist;3: Mod assist Stand Pivot Transfer Details: Tactile cues for weight shifting;Verbal cues for  precautions/safety;Tactile cues for weight beaing;Verbal cues for technique Stand Pivot Transfer Details (indicate cue type and reason): Manual stabilization of L knee due to genu recurvatum.  Squat Pivot Transfers: 4: Min assist;With armrests Squat Pivot Transfer Details: Tactile cues for placement;Tactile cues for weight shifting;Verbal cues for precautions/safety Locomotion  Ambulation Ambulation: Yes Ambulation/Gait Assistance: 1: +2 Total assist;3: Mod assist Ambulation Distance (Feet): 15 Feet (2 x25', 1 x30', 1 x15') Assistive device: 2 person hand held assist;Other (Comment);Hemi-walker Ambulation/Gait Assistance Details: Verbal cues for gait pattern;Verbal cues for safe use of DME/AE;Tactile cues for weight shifting;Verbal cues for precautions/safety;Tactile cues for sequencing;Verbal cues for sequencing;Tactile cues for placement Ambulation/Gait Assistance Details: Pt performed gait x 3 trials total: initial 2 trials (x25' each) with R rail; subsequent trial x30' with hemi walker; final trial x15' with bilat HHA for trial x15'.  ACE wrap then heel wedge (more effective) to prevent genu recurvatum Gait Gait: Yes Gait Pattern: Impaired Gait Pattern: Trunk rotated posteriorly on left;Narrow base of support;Left genu recurvatum;Decreased dorsiflexion - left;Step-to pattern;Decreased step length - right;Decreased stance time - left;Decreased hip/knee flexion - left (L hip externally rotated and adducted) Stairs / Additional Locomotion Stairs: Yes Stairs Assistance: 3: Mod assist Stairs Assistance Details: Verbal cues for precautions/safety;Verbal cues for gait pattern;Verbal cues for technique;Verbal cues for sequencing;Tactile cues for weight shifting Stair Management Technique: Two rails;Step to pattern;Forwards Number of Stairs: 12 Height of Stairs: 6 Wheelchair Mobility Wheelchair Mobility: Yes Wheelchair Assistance: 4: Advertising account executive Details: Verbal cues for  precautions/safety;Verbal cues for technique;Other (comment) (for obstacle negotiation, attention to L visual field) Wheelchair Propulsion: Both upper extremities Wheelchair Parts Management: Needs assistance Distance: 54'  Trunk/Postural Assessment  Cervical Assessment Cervical Assessment: Within Functional Limits Thoracic Assessment Thoracic Assessment: Within Functional Limits Lumbar Assessment Lumbar Assessment: Within Functional Limits Postural Control Postural Control: Deficits on evaluation Postural Limitations: In seated, pt exhibits L hip adduction/internal rotation, passive elongation of trunk on R side. In standing, pt demonstrates minimal LLE weightbearing.  Balance Balance Balance Assessed:  Yes Dynamic Sitting Balance Dynamic Sitting - Balance Support: No upper extremity supported;Feet supported Dynamic Sitting - Level of Assistance: 5: Stand by assistance Static Standing Balance Static Standing - Balance Support: Right upper extremity supported Static Standing - Level of Assistance: 4: Min assist Dynamic Standing Balance Dynamic Standing - Balance Support: Right upper extremity supported Dynamic Standing - Level of Assistance: 3: Mod assist Extremity Assessment  LE Assessment RLE Assessment: Exceptions to [facility] RLE Strength Right Hip Flexion: 4-/5 Right Knee Flexion: 4/5 Right Knee Extension: 4+/5 Right Ankle Dorsiflexion: 5/5 Right Ankle Plantar Flexion: 5/5 LLE Assessment LLE Assessment: Exceptions to WFL LLE Strength LLE Overall Strength: Deficits LLE Overall Strength Comments: R hip flexion strength not assessed due to pain with AROM Left Knee Flexion: 4-/5 Left Knee Extension: 3/5 Left Ankle Dorsiflexion: 4-/5 Left Ankle Plantar Flexion: 4+/5 Left Ankle Inversion: 4/5 Left Ankle Eversion: 3-/5  FIM:  FIM - Bed/Chair Transfer Bed/Chair Transfer Assistive Devices: Arm rests Bed/Chair Transfer: 3: Chair or W/C > Bed: Mod A (lift or lower assist);4:  Chair or W/C > Bed: Min A (steadying Pt. > 75%);3: Bed > Chair or W/C: Mod A (lift or lower assist);4: Bed > Chair or W/C: Min A (steadying Pt. > 75%);5: Supine > Sit: Supervision (verbal cues/safety issues);5: Sit > Supine: Supervision (verbal cues/safety issues) FIM - Locomotion: Wheelchair Distance: 75' Locomotion: Wheelchair: 2: Travels 50 - 149 ft with minimal assistance (Pt.>75%) FIM - Locomotion: Ambulation Locomotion: Ambulation Assistive Devices: Walker - Hemi;Other (comment) (R rail, then hemi walker, then bialt HHA) Ambulation/Gait Assistance: 1: +2 Total assist;3: Mod assist FIM - Locomotion: Stairs Locomotion: Scientist, physiological: Hand rail - 2 Locomotion: Stairs: 3: Up and Down 12 - 14 stairs with moderate assistance (Pt: 50 - 74%)   Refer to Care Plan for Long Term Goals  Recommendations for other services: Other: recreation therapy  Discharge Criteria: Patient will be discharged from PT if patient refuses treatment 3 consecutive times without medical reason, if treatment goals not met, if there is a change in medical status, if patient makes no progress towards goals or if patient is discharged from hospital.  The above assessment, treatment plan, treatment alternatives and goals were discussed and mutually agreed upon: by patient and by family  Stefano Gaul 07/05/2014, 7:39 PM

## 2014-07-05 NOTE — Care Management Note (Signed)
[facility] Individual Statement of Services  Patient Name:  Tracy [name]  Date:  07/05/2014  Welcome to the [facility].  Our goal is to provide you with an individualized program based on your diagnosis and situation, designed to meet your specific needs.  With this comprehensive rehabilitation program, you will be expected to participate in at least 3 hours of rehabilitation therapies Monday-Friday, with modified therapy programming on the weekends.  Your rehabilitation program will include the following services:  Physical Therapy (PT), Occupational Therapy (OT), Speech Therapy (ST), 24 hour per day rehabilitation nursing, Therapeutic Recreaction (TR), Case Management (Social Worker), Rehabilitation Medicine, Nutrition Services and Pharmacy Services  Weekly team conferences will be held on Wednesday to discuss your progress.  Your Social Worker will talk with you frequently to get your input and to update you on team discussions.  Team conferences with you and your family in attendance may also be held.  Expected length of stay: 8-12 days Overall anticipated outcome: supervision with cueing  Depending on your progress and recovery, your program may change. Your Social Worker will coordinate services and will keep you informed of any changes. Your Social Worker's name and contact numbers are listed  below.  The following services may also be recommended but are not provided by the [facility]:   Driving Evaluations  Home Health Rehabiltiation Services  Outpatient Rehabilitation Services    Arrangements will be made to provide these services after discharge if needed.  Arrangements include referral to agencies that provide these services.  Your insurance has been verified to be:  [employer]-Medicare Your primary doctor is:  Oliver BarreJohn James  Pertinent information will be shared with your doctor and your insurance company.  Social  Worker:  Dossie DerBecky Odies Desa, SW [phone] or ([phone]) [phone]  Information discussed with and copy given to patient by: Lucy Chrisupree, Traeton Bordas G, 07/05/2014, 9:51 AM

## 2014-07-05 NOTE — Progress Notes (Signed)
Social Work Assessment and Plan Social Work Assessment and Plan  Patient Details  Name: Tracy [name] MRN: [id number] Date of Birth: Feb 15, 1927  Today's Date: 07/05/2014  Problem List:  Patient Active Problem List   Diagnosis Date Noted  . Hip pain, acute   . Essential hypertension   . 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 Bradycardia 07/01/2014  . Elevated troponin 07/01/2014  . Dysuria 07/01/2014  . Constipation 07/01/2014  . Stroke 07/01/2014  . Right ankle pain 01/29/2014  . Rotator cuff tear arthropathy of right shoulder 06/19/2013  . Left rotator cuff tear arthropathy 05/17/2013  . Fatigue 02/15/2012  . Aneurysm of iliac artery 01/26/2012  . Peripheral vascular disease, unspecified 01/26/2012  . Left shoulder pain 02/09/2011  . Preventative health care 08/02/2010  . BRADYCARDIA 05/29/2010  . SICK SINUS SYNDROME 03/10/2010  . CAROTID STENOSIS 03/10/2010  . PVD WITH CLAUDICATION 02/23/2010  . CONSTIPATION 10/01/2008  . NECK MASS 10/01/2008  . Diabetes 08/01/2007  . DIVERTICULOSIS, COLON 08/01/2007  . SHOULDER PAIN, RIGHT 08/01/2007  . COLONIC POLYPS, HX OF 08/01/2007  . GERD 12/25/2006  . OSTEOPENIA 12/25/2006  . CEREBROVASCULAR ACCIDENT, HX OF 12/25/2006  . Hyperlipidemia 11/29/2006  . Essential hypertension 11/29/2006   Past Medical History:  Past Medical History  Diagnosis Date  . Hyperlipidemia   . HTN (hypertension)   . GERD (gastroesophageal reflux disease)   . CVA (cerebrovascular accident) 2002  . Osteopenia   . Hx of adenomatous colonic polyps 2007  . Diverticulosis of colon   . DM (diabetes mellitus), type 2   . PVD (peripheral vascular disease)     recently dx w/ carotid -left  int carotid >90% 50-60% rt int carotid 02/2010  . PVD (peripheral vascular disease)     lf  ABI 0.11 and rt 0.43  . CAROTID STENOSIS   . DIABETES MELLITUS, TYPE II   . DIVERTICULOSIS, COLON   . GERD   . HYPERLIPIDEMIA   .  OSTEOPENIA   . Atrial fibrillation   . DVT (deep venous thrombosis)   . Sinus node dysfunction 2011  . Stroke   . Hypertension    Past Surgical History:  Past Surgical History  Procedure Laterality Date  . Cns vascular stent per neurosurgeon    . Tubal ligation    . Right shoulder surgery  2010  . Cataract eye surgery    . Lazer eye surgery    . Iliac artery stent  2011    Bilateral CIA stent  . Carotid endarterectomy     Social History:  reports that she has never smoked. She does not have any smokeless tobacco history on file. She reports that she does not drink alcohol or use illicit drugs.  Family / Support Systems Marital Status: Widow/Widower Patient Roles: Parent, Volunteer ChildrenEdmonia Caprio: Alonzo-son [phone]-home  [phone]-cell    Clyde-son [phone] Other Supports: Lynden Angathy Hayes-daughter- [phone]-cell  Anticipated Caregiver: Lambert Ketolonzo she lives with him Ability/Limitations of Caregiver: Lambert Ketolonzo is retired and can assist if needed Caregiver Availability: 24/7 Family Dynamics: Close knit with her five children, three are local  while other two are out of town.  She has always been independent and wants to remain so, if she can recover from this.  She is enocuraged by her progress, but her left knee buckles. Two children are here today with her and observing in therapies.  Social History Preferred language: English Religion: Baptist Cultural Background: No issues Education: [employer] Read: Yes Write: Yes  Employment Status: Retired Date Retired/Disabled/Unemployed: Actor Issues: No issues Guardian/Conservator: none-according to MD pt is capable of making her own decisions while here.  Children plan to be here a lot with her.   Abuse/Neglect Physical Abuse: Denies Verbal Abuse: Denies Sexual Abuse: Denies Exploitation of patient/patient's resources: Denies Self-Neglect: Denies  Emotional Status Pt's affect, behavior adn  adjustment status: Pt is motivated to imporve and is glad to be here on rehab.  She has always been one to take care of others and herself and not have to rely upon others.  This is new for her and she doesn't like it. Both children agree with her and express she wants to do what she wants to do and when she wants. Recent Psychosocial Issues: Other medical issues-has had history of stroke Pyschiatric History: No history deferred depression screen due to pt feels it is not necessary,she is doing well and dealing with this.  Will monitor and have neuro-psych see if needed. Substance Abuse History: No issues  Patient / Family Perceptions, Expectations & Goals Pt/Family understanding of illness & functional limitations: Pt and children have a good understanding of her stroke and deficits.  They speak to the MD and ask their questions they feel they are being addressed and are pleased with her progress, thus far. Pt is ready to move according to her. Premorbid pt/family roles/activities: Mother, grandmother, retiree, church member, home owner,. etc Anticipated changes in roles/activities/participation: resume Pt/family expectations/goals: Pt states: " I want to get back to what I was doing before this, taking care of myself."  Son states: " If there is a will there is a way, with my Mom."  Daughter states: " I am hopeful she will do well here."  [employer]: None Premorbid Home Care/DME Agencies: Other (Comment) (Had after shoulder surgery) Transportation available at discharge: family-pt was driving prior to admission Resource referrals recommended: Support group (specify)  Discharge Planning Living Arrangements: Children Support Systems: Children, Other relatives, Friends/neighbors, Church/faith community Type of Residence: Private residence Insurance Resources: Media planner (specify) Investment banker, operational) Financial Resources: Tree surgeon, Family Support Financial  Screen Referred: No Living Expenses: Own Money Management: Patient, Family Does the patient have any problems obtaining your medications?: No Home Management: Both she and her son share in the home management Patient/Family Preliminary Plans: Return home with son-Alonzo who is retired and can be there with her 24 hr if necessary. Her other children will also be assisting to help their brother out.  Aware will have team conference Wed to review goals and to set a target discharge date. Work on discharge plans. Social Work Anticipated Follow Up Needs: HH/OP, Support Group  Clinical Impression Pleasant female who is spunky and feisty, she is ready to get started and improve in therapies.  Supportive family who is here and observing in therapies. Son whom lives with her can assist, but will need to see if he is Willing to assist with self care needs if necessary.  Will await team's evaluations and come up with a safe discharge plan.    Lucy Chris 07/05/2014, 11:27 AM

## 2014-07-05 NOTE — Progress Notes (Signed)
Patient information reviewed and entered into eRehab system by Marielle Mantione, RN, CRRN, PPS Coordinator.  Information including medical coding and functional independence measure will be reviewed and updated through discharge.     Per nursing patient was given "Data Collection Information Summary for Patients in Inpatient Rehabilitation Facilities with attached "Privacy Act Statement-Health Care Records" upon admission.  

## 2014-07-05 NOTE — Progress Notes (Signed)
PHYSICAL MEDICINE & REHABILITATION     PROGRESS NOTE    Subjective/Complaints: Had a pretty good night. Got a little hot and bound in her blankets. Ready for therapies today  Objective: Vital Signs: Blood pressure 148/85, pulse 65, temperature 98.2 F (36.8 C), temperature source Oral, resp. rate 18, SpO2 98 %. No results found.  Recent Labs  07/03/14 0520  WBC 5.4  HGB 12.5  HCT 38.9  PLT 307    Recent Labs  07/03/14 0520 07/04/14 1650  NA 141 140  K 4.2 3.6  CL 112 109  GLUCOSE 103* 88  BUN 16 11  CREATININE 0.95 0.88  CALCIUM 8.9 9.2   CBG (last 3)  No results for input(s): GLUCAP in the last 72 hours.  Wt Readings from Last 3 Encounters:  07/01/14 52.617 kg (116 lb)  05/17/14 49.896 kg (110 lb)  03/26/14 50.349 kg (111 lb)    Physical Exam:  Constitutional: She is oriented to person, place, and time. She appears well-developed and well-nourished.  HENT:  Head: Normocephalic and atraumatic.  Eyes: Conjunctivae are normal. Pupils are equal, round, and reactive to light.  Neck: Normal range of motion. Neck supple.  Cardiovascular: Normal rate.  Respiratory: Effort normal and breath sounds normal. No respiratory distress. She has no wheezes.  GI: Soft. Bowel sounds are normal. She exhibits no distension. There is no tenderness.  Musculoskeletal: She exhibits no edema or tenderness.  Neurological: She is alert and oriented to person, place, and time. Reasonable insight and awareness Able to follow 1 and 2 step commands without difficulty. Speech generally clear. Mild left facial weakness. Left hemiparesis with ataxia and sensory deficits. LUE 4/5 prox to distal. LLE: 2/5hf, 3/5 ke and 3+ to 4 adf/apf. Sensory 1+/2 left face, 1+/2 fingers/left hand, 1+/2 prox left leg 1/2 distal leg and foot  Skin: Skin is warm and dry.  Psych: pleasant, cooperative  Assessment/Plan: 1. Functional deficits secondary to right thalamic infarction which  require 3+ hours per day of interdisciplinary therapy in a comprehensive inpatient rehab setting. Physiatrist is providing close team supervision and 24 hour management of active medical problems listed below. Physiatrist and rehab team continue to assess barriers to discharge/monitor patient progress toward functional and medical goals. FIM:                   Comprehension Comprehension Mode: Auditory Comprehension: 5-Understands basic 90% of the time/requires cueing < 10% of the time  Expression Expression Mode: Verbal Expression: 5-Expresses basic 90% of the time/requires cueing < 10% of the time.  Social Interaction Social Interaction: 7-Interacts appropriately with others - No medications needed.  Problem Solving Problem Solving: 5-Solves basic problems: With no assist  Memory Memory: 5-Recognizes or recalls 90% of the time/requires cueing < 10% of the time  Medical Problem List and Plan: 1. Functional deficits secondary to Right thalamic infarction with profound left hemisensory deficits (esp left leg) and left hemiparesis 2. DVT Prophylaxis/Anticoagulation: Pharmaceutical: Lovenox 3. Left hip pain/Pain Management: Prn Vicodin  4. Mood: LCSW to follow for evaluation and support.  5. Neuropsych: This patient is capable of making decisions on her own behalf. 6. Skin/Wound Care: routine pressure relief measures.  7. Fluids/Electrolytes/Nutrition: Monitor I/O. Offer supplements between meals. BMET normal today    8. SSS: Has refused PPM in the past. Off norvasc and patient asymptomatic at this time. Discuss 30 day monitor with cards after discharge.  9. HTN: Monitor BP every 8 hours. Blood pressures trending up  and lisinopril resumed today.  10. CKD: Baseline Cr-1.31. (0.88 today) LOS (Days) 1 A FACE TO FACE EVALUATION WAS PERFORMED  Maki Hege T 07/05/2014 9:18 AM

## 2014-07-05 NOTE — Evaluation (Signed)
Speech Language Pathology Assessment and Plan  Patient Details  Name: Tracy [name] MRN: [id number] Date of Birth: 1926-12-11   Today's Date: 07/05/2014 SLP Individual Time: 1100-1140 SLP Individual Time Calculation (min): 40 min   Problem List:  Patient Active Problem List   Diagnosis Date Noted  . Hip pain, acute   . Essential hypertension   . 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 Bradycardia 07/01/2014  . Elevated troponin 07/01/2014  . Dysuria 07/01/2014  . Constipation 07/01/2014  . Stroke 07/01/2014  . Right ankle pain 01/29/2014  . Rotator cuff tear arthropathy of right shoulder 06/19/2013  . Left rotator cuff tear arthropathy 05/17/2013  . Fatigue 02/15/2012  . Aneurysm of iliac artery 01/26/2012  . Peripheral vascular disease, unspecified 01/26/2012  . Left shoulder pain 02/09/2011  . Preventative health care 08/02/2010  . BRADYCARDIA 05/29/2010  . SICK SINUS SYNDROME 03/10/2010  . CAROTID STENOSIS 03/10/2010  . PVD WITH CLAUDICATION 02/23/2010  . CONSTIPATION 10/01/2008  . NECK MASS 10/01/2008  . Diabetes 08/01/2007  . DIVERTICULOSIS, COLON 08/01/2007  . SHOULDER PAIN, RIGHT 08/01/2007  . COLONIC POLYPS, HX OF 08/01/2007  . GERD 12/25/2006  . OSTEOPENIA 12/25/2006  . CEREBROVASCULAR ACCIDENT, HX OF 12/25/2006  . Hyperlipidemia 11/29/2006  . Essential hypertension 11/29/2006   Past Medical History:  Past Medical History  Diagnosis Date  . Hyperlipidemia   . HTN (hypertension)   . GERD (gastroesophageal reflux disease)   . CVA (cerebrovascular accident) 2002  . Osteopenia   . Hx of adenomatous colonic polyps 2007  . Diverticulosis of colon   . DM (diabetes mellitus), type 2   . PVD (peripheral vascular disease)     recently dx w/ carotid -left  int carotid >90% 50-60% rt int carotid 02/2010  . PVD (peripheral vascular disease)     lf  ABI 0.11 and rt 0.43  . CAROTID STENOSIS   . DIABETES MELLITUS, TYPE II    . DIVERTICULOSIS, COLON   . GERD   . HYPERLIPIDEMIA   . OSTEOPENIA   . Atrial fibrillation   . DVT (deep venous thrombosis)   . Sinus node dysfunction 2011  . Stroke   . Hypertension    Past Surgical History:  Past Surgical History  Procedure Laterality Date  . Cns vascular stent per neurosurgeon    . Tubal ligation    . Right shoulder surgery  2010  . Cataract eye surgery    . Lazer eye surgery    . Iliac artery stent  2011    Bilateral CIA stent  . Carotid endarterectomy      Assessment / Plan / Recommendation Clinical Impression  Tracy [name] is an 82 y.o. 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 Family member insisted on bringing her to ED for evaluation and patient was found to be bradycardic with HR 40-50s. CT head showed chronic bilateral occipital and left cerebellar infarcts and negative for acute changes. MRI/MRA brain done revealing 1 cm acute infarction in the right lateral thalamus, extensive old ischemic changes throughout the brain.  Therapy evaluations done and CIR recommended by MD and Rehab team.  Pt admitted to [facility] on 07/04/2014.  SLP evaluation completed on 07/05/2014 with the following results:  Pt presents with grossly intact oropharyngeal swallowing function characterized by  adequate mastication of solid consistencies and complete clearance of residuals from the oral cavity post swallow with mod I use of swallowing precautions for lingual sweep of left buccal residue.  Recommend pt remain on regular textures with thin liquids and intermittent supervision/set up for opening containers and use of swallowing precautions.  Pt's son was present for the duration of today's evaluation and reported that pt has returned to her cognitive baseline.  As a result, no further ST needs are  indicated at this time.     Skilled Therapeutic Interventions          Bedside swallow evaluation completed with results and recommendations reviewed with patient and  family.   SLP reviewed and reinforced use of swallowing precautions to maximize swallowing safety during meals.     SLP Assessment  Patient does not need any further Speech Lanaguage Pathology Services    Recommendations  Diet Recommendations: Regular;Thin liquid Liquid Administration via: Cup;Straw Medication Administration: Whole meds with liquid Supervision: Patient able to self feed;Intermittent supervision to cue for compensatory strategies Compensations: Slow rate;Small sips/bites;Check for pocketing Postural Changes and/or Swallow Maneuvers: Seated upright 90 degrees;Upright 30-60 min after meal Oral Care Recommendations: Oral care BID Equipment Recommended: None recommended by SLP           Pain Pain Assessment Pain Assessment: No/denies pain Prior Functioning Cognitive/Linguistic Baseline: Within functional limits Type of Home: House  Lives With: Son Available Help at Discharge: Family;Available 24 hours/day Vocation: Retired   See FIM for current functional status Refer to [place] for Long Term Goals  Recommendations for other services: None  Discharge Criteria: Patient will be discharged from SLP if patient refuses treatment 3 consecutive times without medical reason, if treatment goals not met, if there is a change in medical status, if patient makes no progress towards goals or if patient is discharged from hospital.  The above assessment, treatment plan, treatment alternatives and goals were discussed and mutually agreed upon: by patient and by family  Emilio Math 07/05/2014, 4:33 PM

## 2014-07-05 NOTE — IPOC Note (Addendum)
Overall Plan of Care [facility](IPOC) Patient Details Name: Tracy [name] MRN: [id number] DOB: 1927/02/22  Admitting Diagnosis: R CVA  Hospital Problems: Principal Problem:   Cerebral infarction due to thrombosis of other cerebral artery Active Problems:   Essential hypertension   SICK SINUS SYNDROME     Functional Problem List: Nursing Endurance, Medication Management, Perception, Pain, Safety, Sensory, Skin Integrity  PT Balance, Motor, Perception, Safety, Sensory, Other (comment) (Inattention to L hemibody and L visual field)  OT Balance, Sensory, Motor, Perception, Safety  SLP    TR Activity tolerance, functional mobility, balance, safety, pain, anxiety/stress         Basic ADL's: OT Grooming, Bathing, Dressing, Toileting     Advanced  ADL's: OT Simple Meal Preparation, Laundry     Transfers: PT Bed Mobility, Bed to Chair, Car, State Street CorporationFurniture, Floor  OT Toilet     Locomotion: PT Ambulation, Psychologist, prison and probation servicesWheelchair Mobility, Stairs     Additional Impairments: OT None  SLP        TR None    Anticipated Outcomes Item Anticipated Outcome  Self Feeding n/a  Swallowing      Basic self-care  Mod I - supervision  Toileting  supervision   Bathroom Transfers supervision - toilet  Bowel/Bladder  manage bowel and bladder Mod I  Transfers  Supervision  Locomotion  Supervision  Communication     Cognition     Pain  3 or less  Safety/Judgment  Minimal assist   Therapy Plan: PT Intensity: Minimum of 1-2 x/day ,45 to 90 minutes PT Frequency: 5 out of 7 days PT Duration Estimated Length of Stay: 8-12 days  OT Intensity: Minimum of 1-2 x/day, 45 to 90 minutes OT frequency: 5 out of 7 days OT duration Estimated length of stay: 7 - 10 days TR Duration/ELOS:  7 Days TR Frequency:  Min 1 time per week >20 minutes         Team Interventions: Nursing Interventions Patient/Family Education, Pain Management, Skin Care/Wound Management, Medication Management  PT interventions  Ambulation/gait training, Discharge planning, Balance/vestibular training, DME/adaptive equipment instruction, Functional mobility training, Disease management/prevention, Neuromuscular re-education, Patient/family education, Splinting/orthotics, Therapeutic Activities, Therapeutic Exercise, Visual/perceptual remediation/compensation, UE/LE Coordination activities, UE/LE Strength taining/ROM, Wheelchair propulsion/positioning, Stair training  OT Interventions Balance/vestibular training, Discharge planning, Self Care/advanced ADL retraining, Therapeutic Activities, UE/LE Coordination activities, Functional mobility training, Patient/family education, Therapeutic Exercise, DME/adaptive equipment instruction, Neuromuscular re-education, UE/LE Strength taining/ROM, Community reintegration  SLP Interventions    TR Interventions Recreation/leisure participation, Balance/Vestibular training, functional mobility, therapeutic activities, UE/LE strength/coordination, w/c mobility, community reintegration, pt/family education, adaptive equipment instruction/use, discharge planning, psychosocial support  SW/CM Interventions Discharge Planning, Facilities managersychosocial Support, Patient/Family Education    Team Discharge Planning: Destination: PT-Home ,OT- Home , SLP-  Projected Follow-up: PT-Outpatient PT, 24 hour supervision/assistance, OT-  24 hour supervision/assistance (outpatient OT), SLP-  Projected Equipment Needs: PT-To be determined, OT- To be determined, SLP-None recommended by SLP Equipment Details: PT- , OT-  Patient/family involved in discharge planning: PT- Patient, Family member/caregiver,  OT-Patient, SLP-Patient, Family member/caregiver  MD ELOS: 8-12 days Medical Rehab Prognosis:  Excellent Assessment: The patient has been admitted for CIR therapies with the diagnosis of right CVA. The team will be addressing functional mobility, strength, stamina, balance, safety, adaptive techniques and equipment,  self-care, bowel and bladder mgt, patient and caregiver education, NMR, ego support, community reintegration. Goals have been set at supervision.    Ranelle OysterZachary T. Swartz, MD, [facility] CenterFAAPMR      See Team Conference Notes for weekly updates to  the plan of care

## 2014-07-05 NOTE — Telephone Encounter (Signed)
Called pt again still no answer x's 10 rings.../lmb 

## 2014-07-06 ENCOUNTER — Inpatient Hospital Stay (HOSPITAL_COMMUNITY): Payer: Self-pay | Admitting: *Deleted

## 2014-07-06 ENCOUNTER — Inpatient Hospital Stay (HOSPITAL_COMMUNITY): Payer: Medicare Other | Admitting: Occupational Therapy

## 2014-07-06 NOTE — Progress Notes (Signed)
Patient ID: Tracy [name], female   DOB: [birth year], 81 y.o.   MRN: [id number]   [facility] PHYSICAL MEDICINE & REHABILITATION     PROGRESS NOTE   07/06/14. Subjective/Complaints:  81 year old patient admitted for CIR with functional deficits secondary to Right thalamic infarction with profound left hemisensory deficits  and left hemiparesis Had a pretty good night.  No complaints today.  Sitting in wheelchair and brushing teeth  Past Medical History  Diagnosis Date  . Hyperlipidemia   . HTN (hypertension)   . GERD (gastroesophageal reflux disease)   . CVA (cerebrovascular accident) 2002  . Osteopenia   . Hx of adenomatous colonic polyps 2007  . Diverticulosis of colon   . DM (diabetes mellitus), type 2   . PVD (peripheral vascular disease)     recently dx w/ carotid -left  int carotid >90% 50-60% rt int carotid 02/2010  . PVD (peripheral vascular disease)     lf  ABI 0.11 and rt 0.43  . CAROTID STENOSIS   . DIABETES MELLITUS, TYPE II   . DIVERTICULOSIS, COLON   . GERD   . HYPERLIPIDEMIA   . OSTEOPENIA   . Atrial fibrillation   . DVT (deep venous thrombosis)   . Sinus node dysfunction 2011  . Stroke   . Hypertension      Objective: Vital Signs: Blood pressure 149/72, pulse 60, temperature 98.4 F (36.9 C), temperature source Oral, resp. rate 18, SpO2 99 %. No results found. No results for input(s): WBC, HGB, HCT, PLT in the last 72 hours.  Recent Labs  07/04/14 1650  NA 140  K 3.6  CL 109  GLUCOSE 88  BUN 11  CREATININE 0.88  CALCIUM 9.2   CBG (last 3)  No results for input(s): GLUCAP in the last 72 hours.  Wt Readings from Last 3 Encounters:  07/01/14 116 lb (52.617 kg)  05/17/14 110 lb (49.896 kg)  03/26/14 111 lb (50.349 kg)   Patient Vitals for the past 24 hrs:  BP Temp Temp src Pulse Resp SpO2  07/06/14 0504 (!) 149/72 mmHg 98.4 F (36.9 C) Oral 60 18 99 %  07/05/14 1427 (!) 154/67 mmHg 98.5 F (36.9 C) Oral (!) 54 18 100 %   Physical  Exam:  Constitutional: She is oriented to person, place, and time. She appears well-developed and well-nourished.  HENT:  Head: Normocephalic and atraumatic.  Eyes: Conjunctivae are normal. Pupils are equal, round, and reactive to light.  Neck: Normal range of motion. Neck supple.  Cardiovascular: Normal rate. Irreg Respiratory: Effort normal and breath sounds normal. No respiratory distress. She has no wheezes.  GI: Soft. Bowel sounds are normal. She exhibits no distension. There is no tenderness.  Musculoskeletal: She exhibits no edema or tenderness.  Neurological: She is alert and oriented to person, place, and time. Reasonable insight and awareness Able to follow 1 and 2 step commands without difficulty. Speech generally clear. Mild left facial weakness. Left hemiparesis with ataxia and sensory deficits. Skin: Skin is warm and dry.  Psych: pleasant, cooperative  Medical Problem List and Plan: 1. Functional deficits secondary to Right thalamic infarction with profound left hemisensory deficits (esp left leg) and left hemiparesis 2. DVT Prophylaxis/Anticoagulation: Pharmaceutical: Lovenox 3. Left hip pain/Pain Management: Prn Vicodin  4. Mood: LCSW to follow for evaluation and support.  5. Neuropsych: This patient is capable of making decisions on her own behalf. 6. Skin/Wound Care: routine pressure relief measures.  7. Fluids/Electrolytes/Nutrition: Monitor I/O. Offer supplements between meals. BMET  normal today    8. SSS: Has refused PPM in the past. Off norvasc and patient asymptomatic at this time. Discuss 30 day monitor with cards after discharge.  9. HTN: Monitor BP every 8 hours. Blood pressures trending up and lisinopril resumed today.  10. CKD: Baseline Cr-1.31. (0.88 today) LOS (Days) 2 A FACE TO FACE EVALUATION WAS PERFORMED  Rogelia Boga 07/06/2014 8:10 AM

## 2014-07-06 NOTE — Progress Notes (Signed)
Occupational Therapy Session Note  Patient Details  Name: Tracy [name] MRN: [id number] Date of Birth: 09-Sep-1926  Today's Date: 07/06/2014 OT Individual Time: 0900-1000 and 1400-1500 OT Individual Time Calculation (min): 60 min and 60 min   Short Term Goals: Week 1:  OT Short Term Goal 1 (Week 1): STG= LTGs secondary to short estimated LOS  Skilled Therapeutic Interventions/Progress Updates:    Visit 1:  No c/o pain.  Pt seen for BADL retraining of B/D at sink level with a focus on standing balance and LLE motor control.  Pt had improved L attention and LUE coordination in that she did not need any cuing to attend to L side and was able to open all containers, fasten bra, and tie her shoes. Pt needed encouragement to work in standing as much as possible. In standing, pt needs max support at her L knee and mod support at L hip as her L leg buckles constantly. Pt stated that she has diminished feeling in L leg.  Pt demonstrated excellent activity tolerance as she was able to stand for 10 minutes at a time. She tends to push off her R onto her L.  In sitting, weight shift and balance exercises to increase hip mobility. Pt resting in w/c at end of session with call light in reach.  Visit 2: No c/o pain.   Pt seen this session for neuro re-ed to facilitate LLE motor control and standing balance to improve her I with ADLs. Pt received in w/c and was taken to gym. She transferred to mat to work on dynamic reaching to the L on an elevated mat for forced use on LLE.  Pt also worked on sit to stand and various heights and angles, standing with hip sways and slight knee bends. She was demonstrating much improved control of L leg with less knee buckling this afternoon. On mat she worked on isolated LLE exercises with 2 sets each of bridges, bent knee abd/add, knee extension with leg on bolster, and knee flexion and extension with L foot on ball rolling ball in and out. She needed mod A to control leg into  extension on ball rolls as her quads were not able to fire eccentrically well. She then worked on rolling onto L side and pushing self into sitting with slight A. She practiced mat >< w/c squat pivots 3x all with slight contact guard. Pt participated very well this session. Pt returned to room to visit with her family members.   Therapy Documentation Precautions:  Precautions Precautions: Fall Restrictions Weight Bearing Restrictions: No  Pain: Pain Assessment Pain Assessment: No/denies pain ADL:  See FIM for current functional status  Therapy/Group: Individual Therapy  Pantera Winterrowd 07/06/2014, 11:33 AM

## 2014-07-06 NOTE — Progress Notes (Signed)
Physical Therapy Session Note  Patient Details  Name: Tracy [name] MRN: [id number] Date of Birth: 1926/08/04  Today's Date: 07/06/2014 PT Individual Time: 11:15-12:15 (60min)   Short Term Goals: Week 1:  PT Short Term Goal 1 (Week 1): STG's = LTG's secondary to anticipated LOS.  Skilled Therapeutic Interventions/Progress Updates:  Tx focused on functional mobility training, L-sided attention and coordination tasks, gait with RW, stairs and NMR via forced use, manual facilitation, and multi-modal cues. [facility].   Pt propelled WC x150' with S and cues for efficinecy and turning. Pt needs safety cues for set-up and parts management.   Pt instructed in blocked practice of squat-pivot tansfers with demonstration and cues for sequence. Performance improved from Max>>Min A with practice of 8 trials WC<>mat.   Pt performed NMR for L hip/knee control with mirror for visual feedback via:  - static standing with/without UEs on RW - lateral weight shifts - serial sit<>Stands x12 with L-sided weight shift - Step-taps to 4" cone  - seated LAQ, ankle pumps, marches 2x10  - seated UE FMC for folding tasks - Nustep x3 min with bil UE/LE, x53min with LEs only for coordination, level 3>>4.  Pt needed manual facilitation for controlling L weight shift due to decreased ability to grade movements and control hip/knee. Genu recurvatum still present, but ACE helps some.   Gait in controlled setting with RW and up to Mod A for weight shifting and manual facilitation at trunk 1x75', 1x25' with cues for increased R shift to increase L foot clearance. Pt with limited knee flexion in swing and decreased heel strike.   Stair training x12 with up to Mod A for lowering, bil rails. Pt able to ascend with LLE leading, improving strength.       Therapy Documentation Precautions:  Precautions Precautions: Fall Restrictions Weight Bearing Restrictions: No Pain: Pain Assessment Pain  Assessment: No/denies pain  See FIM for current functional status  Therapy/Group: Individual Therapy   Clydene Lamingole Caria Transue, PT, DPT] 07/06/2014, 11:39 AM

## 2014-07-07 ENCOUNTER — Inpatient Hospital Stay (HOSPITAL_COMMUNITY): Payer: Medicare Other

## 2014-07-07 NOTE — Progress Notes (Signed)
Occupational Therapy Session Note  Patient Details  Name: Tracy [name] MRN: [id number] Date of Birth: Aug 09, 1926  Today's Date: 07/07/2014 OT Individual Time: 1000-1030 OT Individual Time Calculation (min): 30 min    Short Term Goals: Week 1:  OT Short Term Goal 1 (Week 1): STG= LTGs secondary to short estimated LOS  Skilled Therapeutic Interventions/Progress Updates:    Pt seen for ADL retraining with focus on sit<>stand, standing balance, activity tolerance, and safety awareness. Pt received sitting in w/c requesting to bathe at sink. Completed bathing and dressing with min A for standing balance and cues for LLE positioning for each sit<>stand. Pt demonstrated good coordination with L hand as she was able to fasten bra and open all containers using L hand dominantly. Pt impulsive throughout session, requiring max cues to slow pace as she was sliding out of w/c with no awareness in attempt to don socks. Educated on slowing pace and increasing visual awareness of LLE during all transitional movements. At end of session pt left sitting in w/c with friends present and all needs in reach.    Therapy Documentation Precautions:  Precautions Precautions: Fall Restrictions Weight Bearing Restrictions: No General:   Vital Signs:  Pain: Pain Assessment Pain Assessment: No/denies pain  See FIM for current functional status  Therapy/Group: Individual Therapy  Chazlyn Cude, Vara GuardianKayla N 07/07/2014, 12:00 PM

## 2014-07-07 NOTE — Progress Notes (Signed)
Patient ID: Tracy [name], female   DOB: 03-13-1927, 81 y.o.   MRN: [id number]  Patient ID: Tracy [name], female   DOB: 1927/03/12, 81 y.o.   MRN: [id number]   [place] [facility]IC[facility] & REHABILITATION     PROGRESS NOTE   07/07/14. Subjective/Complaints:  81 year old patient admitted for CIR with functional deficits secondary to Right thalamic infarction with profound left hemisensory deficits  and left hemiparesis Had another  good night.  No complaints today.   Past Medical History  Diagnosis Date  . Hyperlipidemia   . HTN (hypertension)   . GERD (gastroesophageal reflux disease)   . CVA (cerebrovascular accident) 2002  . Osteopenia   . Hx of adenomatous colonic polyps 2007  . Diverticulosis of colon   . DM (diabetes mellitus), type 2   . PVD (peripheral vascular disease)     recently dx w/ carotid -left  int carotid >90% 50-60% rt int carotid 02/2010  . PVD (peripheral vascular disease)     lf  ABI 0.11 and rt 0.43  . CAROTID STENOSIS   . DIABETES MELLITUS, TYPE II   . DIVERTICULOSIS, COLON   . GERD   . HYPERLIPIDEMIA   . OSTEOPENIA   . Atrial fibrillation   . DVT (deep venous thrombosis)   . Sinus node dysfunction 2011  . Stroke   . Hypertension      Objective: Vital Signs: Blood pressure 153/66, pulse 58, temperature 98 F (36.7 C), temperature source Oral, resp. rate 17, SpO2 99 %. No results found. No results for input(s): WBC, HGB, HCT, PLT in the last 72 hours.  Recent Labs  07/04/14 1650  NA 140  K 3.6  CL 109  GLUCOSE 88  BUN 11  CREATININE 0.88  CALCIUM 9.2   CBG (last 3)  No results for input(s): GLUCAP in the last 72 hours.  Wt Readings from Last 3 Encounters:  07/01/14 116 lb (52.617 kg)  05/17/14 110 lb (49.896 kg)  03/26/14 111 lb (50.349 kg)   Patient Vitals for the past 24 hrs:  BP Temp Temp src Pulse Resp SpO2  07/07/14 0437 (!) 153/66 mmHg 98 F (36.7 C) Oral (!) 58 - 99 %  07/06/14 1300 (!) 155/71 mmHg 98.1 F (36.7  C) Oral 65 17 100 %   Physical Exam:  Constitutional: She is oriented to person, place, and time. She appears well-developed and well-nourished.  HENT:  Head: Normocephalic and atraumatic.  Eyes: Conjunctivae are normal. Pupils are equal, round, and reactive to light.  Neck: Normal range of motion. Neck supple.  Cardiovascular: Normal rate. Regular this am Respiratory: Effort normal and breath sounds normal. No respiratory distress. She has no wheezes.  GI: Soft. Bowel sounds are normal. She exhibits no distension. There is no tenderness.  Musculoskeletal: She exhibits no edema or tenderness.  Neurological: She is alert and oriented to person, place, and time. Reasonable insight and awareness Able to follow 1 and 2 step commands without difficulty. Speech generally clear. Mild left facial weakness. Left hemiparesis with ataxia and sensory deficits. Skin: Skin is warm and dry.  Psych: pleasant, cooperative Extremities- SCDs in place  Medical Problem List and Plan: 1. Functional deficits secondary to Right thalamic infarction with profound left hemisensory deficits (esp left leg) and left hemiparesis 2. DVT Prophylaxis/Anticoagulation: Pharmaceutical: Lovenox 3. Left hip pain/Pain Management: Prn Vicodin  4. Mood: LCSW to follow for evaluation and support.  5. Neuropsych: This patient is capable of making decisions on her own behalf.  6. Skin/Wound Care: routine pressure relief measures.  7. Fluids/Electrolytes/Nutrition: Monitor I/O. Offer supplements between meals. BMET normal today    8. SSS: Has refused PPM in the past. Off norvasc and patient asymptomatic at this time. Discuss 30 day monitor with cards after discharge.  9. HTN: Monitor BP every 8 hours. Blood pressures trending up and lisinopril resumed today.   LOS (Days) 3 A FACE TO FACE EVALUATION WAS PERFORMED  Rogelia BogaKWIATKOWSKI,[name] 07/07/2014 7:59 AM

## 2014-07-08 ENCOUNTER — Inpatient Hospital Stay (HOSPITAL_COMMUNITY): Payer: Medicare Other | Admitting: Physical Therapy

## 2014-07-08 ENCOUNTER — Inpatient Hospital Stay (HOSPITAL_COMMUNITY): Payer: Medicare Other | Admitting: Occupational Therapy

## 2014-07-08 NOTE — Progress Notes (Signed)
Occupational Therapy Session Note  Patient Details  Name: Tracy [name] MRN: [id number] Date of Birth: 07/12/1926  Today's Date: 07/08/2014 OT Individual Time: 0830-0930 OT Individual Time Calculation (min): 60 min    Short Term Goals: Week 1:  OT Short Term Goal 1 (Week 1): STG= LTGs secondary to short estimated LOS  Skilled Therapeutic Interventions/Progress Updates:    Therapist facilitated skilled session focusing on ADL training, UB/LB bathing and dressing. Pt rec'd in room in w/c c/o of pain in buttocks due to inactivity. Therapist provided instruction on pressure relief methods including lateral leans and forward leans. Pt demonstrated seated bridge position as a technique used when alone. Educated on avoiding this position as LE could slip off foot rest and inc fall risk. Pt completed bathing with supervision in w/c sink level per request. Performed sit>stand min A x2 to don underwear and pants. PLOF did not included showering, only tub baths. Some safety awareness deficits noted as pt attempted to stand impulsively up onto w/c footplates. Good use and coordination of LUE in completing tasks, reports more weakness in LLE. Pt educated on importance of attending to upright body position to avoid postural slump and risk of sliding down in w/c.     Therapy Documentation Precautions:  Precautions Precautions: Fall Restrictions Weight Bearing Restrictions: No General:   Vital Signs:  Pain: Pain Assessment Pain Assessment: No/denies pain ADL:   Exercises:   Other Treatments:    See FIM for current functional status  Therapy/Group: Individual Therapy  Jonna Dittrich 07/08/2014, 10:45 AM

## 2014-07-08 NOTE — Progress Notes (Signed)
Physical Therapy Session Note  Patient Details  Name: Tracy [name] MRN: [id number] Date of Birth: 1926/11/10  Today's Date: 07/08/2014 PT Individual Time: 1030-1200 PT Individual Time Calculation (min): 90 min   Short Term Goals: Week 1:  PT Short Term Goal 1 (Week 1): STG's = LTG's secondary to anticipated LOS.  Skilled Therapeutic Interventions/Progress Updates:  Pt received in w/c in room; agreeable to therapy. Session focused on functional balance, gait traing, and NMR.  Pt requested to use the bathroom, requiring mod A (+2A for safety due to pt difficulty advancing L LE) for stand/pivot transfer w/c<>toilet. Pt performed w/c propulsion with B UEs 50' into gym with mod verbal cues to attend to L side.  Pt completed the Berg Balance Scale (BBS) with a score of 11/56 and the Postural Assessment Scale for Stroke Patients (PASS) with a score of 24/36 (see below for scoring details).  Pt educated on BBS and PASS scores and increased risk for falls.  Donned ACE bandage to L LE to prevent genu recurvatum and facilitate functional L ankle dorsiflexion and eversion during gait.  Pt performed 5 mini-squats x2 rounds with mod tactile and verbal cues to prevent genu recurvatum.  Mirror utilized for visual feedback to promote L knee control.  ACE bandaged doffed and swedish knee cage donned before ambulation due to ACE bandage failing to prevent genu recurvatum.  Pt ambulated 70' min A/min guard with +2A for w/c follow with min verbal and tactile cues from gym towards room with youth rolling walker.  Pt transported total A back to room due to time constraints, and was left in w/c with all needs within reach and daughter present.    Therapy Documentation Precautions:  Precautions Precautions: Fall Restrictions Weight Bearing Restrictions: No Pain: Pain Assessment Pain Assessment: No/denies pain Locomotion : Ambulation Ambulation/Gait Assistance: 3: Mod assist Wheelchair Mobility Distance: 50   Balance: Balance Balance Assessed: Yes Standardized Balance Assessment Standardized Balance Assessment: Berg Balance Test Berg Balance Test Sit to Stand: Needs minimal aid to stand or to stabilize Standing Unsupported: Able to stand 30 seconds unsupported Sitting with Back Unsupported but Feet Supported on Floor or Stool: Able to sit safely and securely 2 minutes Stand to Sit: Sits independently, has uncontrolled descent Transfers: Needs one person to assist Standing Unsupported with Eyes Closed: Able to stand 3 seconds Standing Ubsupported with Feet Together: Needs help to attain position and unable to hold for 15 seconds From Standing, Reach Forward with Outstretched Arm: Loses balance while trying/requires external support From Standing Position, Pick up Object from Floor: Unable to try/needs assist to keep balance From Standing Position, Turn to Look Behind Over each Shoulder: Needs assist to keep from losing balance and falling Turn 360 Degrees: Needs assistance while turning Standing Unsupported, Alternately Place Feet on Step/Stool: Needs assistance to keep from falling or unable to try Standing Unsupported, One Foot in Front: Loses balance while stepping or standing Standing on One Leg: Unable to try or needs assist to prevent fall Total Score: 11  Postural Assessment Scale for Stroke Patients (PASS) Maintaining a Posture _3_ 1. Sitting Without Support Instructions: Have the subject sit on a bench/mat without back support and with feet flat on the floor. (3) Can sit for 5 minutes without support (2) Can sit for more than 10 seconds without support (1) Can sit with slight support (for example, by 1 hand) (0) Cannot sit _2_ 2. Standing With Support Instructions: Have the subject stand, providing support as needed. Evaluate only  the ability to stand with or without support. Do not consider the quality of the stance. (3) Can stand with support of only 1 hand (2) Can stand with  moderate support of 1 person (1) Can stand with strong support of 2 people (0) Cannot stand, even with support _1_ 3. Standing Without Support Instructions: Have the subject stand without support. Evaluate only the ability to stand with or without support. Do not consider the quality of the stance. (3) Can stand without support for more than 1 minute and simultaneously perform arm movements at about shoulder level (2) Can stand without support for 1 minute or stands slightly asymmetrically  (1) Can stand without support for 10 seconds or leans heavily on 1 leg (0) Cannot stand without support _1_ 4. Standing on Nonparetic Leg Instructions: Have the subject stand on the nonparetic leg. Evaluate only the ability to bear weight entirely on the nonparetic leg. Do not consider how the subject accomplishes the task. (3) Can stand on nonparetic leg for more than 10 seconds (2) Can stand on nonparetic leg for more than 5 seconds (1) Can stand on nonparetic leg for a few seconds (0) Cannot stand on nonparetic leg _1_ 5. Standing on Paretic Leg Instructions: Have the subject stand on the paretic leg. Evaluate only the ability to bear weight entirely on the paretic leg. Do not consider how the subject accomplishes the task. (3) Can stand on paretic leg for more than 10 seconds (2) Can stand on paretic leg for more than 5 seconds (1) Can stand on paretic leg for a few seconds (0) Cannot stand on paretic leg Maintaining Posture SUBTOTAL ____8____ Changing a Posture _3_ 6. Supine to Paretic Side Lateral Instructions: Begin with the subject in supine on a treatment mat. Instruct the subject to roll to the paretic side (lateral movement). Assist as necessary. Evaluate the subject's performance on the amount of help required. Do not consider the quality of performance. (3) Can perform without help (2) Can perform with little help (1) Can perform with much help (0) Cannot perform _3_ 7. Supine to  Nonparetic Side Lateral Instructions: Begin with the subject in supine on a treatment mat. Instruct the subject to roll to the nonparetic side (lateral movement). Assist as necessary. Evaluate the subject's performance on the amount of help required. Do not consider the quality of performance. (3) Can perform without help (2) Can perform with little help (1) Can perform with much help (0) Cannot perform _3_ 8. Supine to Sitting Up on the Edge of the Mat Instructions: Begin with the subject in supine on a treatment mat. Instruct the subject to come to sitting on the edge of the mat. Assist as necessary. Evaluate the subject's performance on the amount of help required. Do not consider the quality of performance. (3) Can perform without help (2) Can perform with little help (1) Can perform with much help (0) Cannot perform _2_ 9. Sitting on the Edge of the Mat to Supine Instructions: Begin with the on the edge of a treatment mat. Instruct the subject to return to supine. Assist as necessary. Evaluate the subject's performance on the amount of help required. Do not consider the quality of performance. (3) Can perform without help (2) Can perform with little help (1) Can perform with much help (0) Cannot perform _2_ 10. Sitting to Standing Up Instructions: Begin with the subject sitting on the edge of a treatment mat. Instruct the subject to stand up without support. Assist if necessary.  Evaluate the subject's performance on the amount of help required. Do not consider the quality of performance. (3) Can perform without help (2) Can perform with little help (1) Can perform with much help (0) Cannot perform _2_ 11. Standing Up to Sitting Down Instructions: Begin with the subject standing by edge of a treatment mat. Instruct the subject to sit on edge of mat without support. Assist if necessary. Evaluate the subject's performance on the amount of help required. Do not consider the quality of  performance. (3) Can perform without help (2) Can perform with little help (1) Can perform with much help (0) Cannot perform _1_ 12. Standing, Picking Up a Pencil from the Floor Instructions: Begin with the subject standing. Instruct the subject to pick up a pencil fro the floor without support. Assist if necessary. Evaluate the subject's performance on the amount of help required. Do not consider the quality of performance. (3) Can perform without help (2) Can perform with little help (1) Can perform with much help (0) Cannot perform Changing Posture SUBTOTAL _16____ TOTAL _24/36____        See FIM for current functional status  Therapy/Group: Individual Therapy  Itzelle Gains 07/08/2014, 12:30 PM

## 2014-07-08 NOTE — Progress Notes (Signed)
[place] PHYSICAL MEDICINE & REHABILITATION     PROGRESS NOTE    Subjective/Complaints: Pt without new issues overnite, no BM x 1 day, pt c/o that miralax caused loose stools for 2 days Review of Systems - Negative except LLE weakness in addition to above  Objective: Vital Signs: Blood pressure 137/59, pulse 54, temperature 98.2 F (36.8 C), temperature source Oral, resp. rate 17, SpO2 98 %. No results found. No results for input(s): WBC, HGB, HCT, PLT in the last 72 hours. No results for input(s): NA, K, CL, GLUCOSE, BUN, CREATININE, CALCIUM in the last 72 hours.  Invalid input(s): CO CBG (last 3)  No results for input(s): GLUCAP in the last 72 hours.  Wt Readings from Last 3 Encounters:  07/01/14 52.617 kg (116 lb)  05/17/14 49.896 kg (110 lb)  03/26/14 50.349 kg (111 lb)    Physical Exam:  Constitutional: She is oriented to person, place, and time. She appears well-developed and well-nourished.  HENT:  Head: Normocephalic and atraumatic.  Eyes: Conjunctivae are normal. Pupils are equal, round, and reactive to light.  Neck: Normal range of motion. Neck supple.  Cardiovascular: Normal rate.  Respiratory: Effort normal and breath sounds normal. No respiratory distress. She has no wheezes.  GI: Soft. Bowel sounds are normal. She exhibits no distension. There is no tenderness.  Musculoskeletal: She exhibits no edema or tenderness.  Neurological: She is alert and oriented to person, place, and time. Reasonable insight and awareness Able to follow 1 and 2 step commands without difficulty. Speech generally clear. Mild left facial weakness. Left hemiparesis with ataxia and sensory deficits. LUE 4/5 prox to distal. LLE: 2/5hf, 3/5 ke and 3+ to 4 adf/apf. Sensory 1+/2 left face, 1+/2 fingers/left hand, 1+/2 prox left leg 1/2 distal leg and foot  Skin: Skin is warm and dry.  Psych: pleasant, cooperative  Assessment/Plan: 1. Functional deficits secondary to right  thalamic infarction which require 3+ hours per day of interdisciplinary therapy in a comprehensive inpatient rehab setting. Physiatrist is providing close team supervision and 24 hour management of active medical problems listed below. Physiatrist and rehab team continue to assess barriers to discharge/monitor patient progress toward functional and medical goals. FIM: FIM - Bathing Bathing Steps Patient Completed: Chest, Right Arm, Left Arm, Abdomen, Front perineal area, Right upper leg, Left upper leg, Right lower leg (including foot), Left lower leg (including foot), Buttocks Bathing: 4: Steadying assist  FIM - Upper Body Dressing/Undressing Upper body dressing/undressing steps patient completed: Thread/unthread right bra strap, Thread/unthread left bra strap, Thread/unthread right sleeve of pullover shirt/dresss, Thread/unthread left sleeve of pullover shirt/dress, Put head through opening of pull over shirt/dress, Pull shirt over trunk, Hook/unhook bra Upper body dressing/undressing: 5: Set-up assist to: Obtain clothing/put away FIM - Lower Body Dressing/Undressing Lower body dressing/undressing steps patient completed: Thread/unthread right underwear leg, Thread/unthread left underwear leg, Thread/unthread right pants leg, Thread/unthread left pants leg, Don/Doff right sock, Don/Doff left sock, Don/Doff right shoe, Fasten/unfasten right shoe, Pull underwear up/down, Pull pants up/down Lower body dressing/undressing: 4: Min-Patient completed 75 plus % of tasks  FIM - Toileting Toileting steps completed by patient: Performs perineal hygiene Toileting Assistive Devices: Grab bar or rail for support Toileting: 4: Steadying assist  FIM - Diplomatic Services operational officer Devices: Grab bars Toilet Transfers: 4-To toilet/BSC: Min A (steadying Pt. > 75%), 4-From toilet/BSC: Min A (steadying Pt. > 75%)  FIM - Bed/Chair Transfer Bed/Chair Transfer Assistive Devices: Walker, Arm  rests Bed/Chair Transfer: 2: Bed > Chair or  W/C: Max A (lift and lower assist), 4: Chair or W/C > Bed: Min A (steadying Pt. > 75%)  FIM - Locomotion: Wheelchair Distance: 150 Locomotion: Wheelchair: 5: Travels 150 ft or more: maneuvers on rugs and over door sills with supervision, cueing or coaxing FIM - Locomotion: Ambulation Locomotion: Ambulation Assistive Devices: Designer, industrial/productWalker - Rolling Ambulation/Gait Assistance: 3: Mod assist Locomotion: Ambulation: 2: Travels 50 - 149 ft with moderate assistance (Pt: 50 - 74%)  Comprehension Comprehension Mode: Auditory Comprehension: 5-Follows basic conversation/direction: With extra time/assistive device  Expression Expression Mode: Verbal Expression: 5-Expresses basic needs/ideas: With extra time/assistive device  Social Interaction Social Interaction: 6-Interacts appropriately with others with medication or extra time (anti-anxiety, antidepressant).  Problem Solving Problem Solving: 5-Solves basic problems: With no assist  Memory Memory: 5-Recognizes or recalls 90% of the time/requires cueing < 10% of the time  Medical Problem List and Plan: 1. Functional deficits secondary to Right thalamic infarction with profound left hemisensory deficits (esp left leg) and left hemiparesis 2. DVT Prophylaxis/Anticoagulation: Pharmaceutical: Lovenox 3. Left hip pain/Pain Management: Prn Vicodin  4. Mood: LCSW to follow for evaluation and support.  5. Neuropsych: This patient is capable of making decisions on her own behalf. 6. Skin/Wound Care: routine pressure relief measures.  7. Fluids/Electrolytes/Nutrition: Monitor I/O. Offer supplements between meals. BMET normal today    8. SSS: Has refused PPM in the past. Off norvasc and patient asymptomatic at this time. Discuss 30 day monitor with cards after discharge.  9. HTN: Monitor BP every 8 hours. Blood pressures trending up and lisinopril resumed today.  10. CKD: Baseline Cr-1.31. (0.88  today) LOS (Days) 4 A FACE TO FACE EVALUATION WAS PERFORMED  KIRSTEINS,ANDREW E 07/08/2014 8:58 AM

## 2014-07-08 NOTE — Progress Notes (Signed)
Occupational Therapy Session Note  Patient Details  Name: Tracy [name] MRN: [id number] Date of Birth: 05/03/1926  Today's Date: 07/08/2014 OT Individual Time: 1300-1330 OT Individual Time Calculation (min): 30 min    Short Term Goals: Week 1:  OT Short Term Goal 1 (Week 1): STG= LTGs secondary to short estimated LOS  Skilled Therapeutic Interventions/Progress Updates:    Therapist facilitated skilled session focusing simple transfer training, safety awareness, compensatory strategies for ADL completion, dynamic sitting/standing balance, and simple problem solving. Pt rec'd in room in w/c. Engaged in w/c<>EOB transfer using stand/pivot. Recommend to employ squat/pivot due to weak LLE and decreased safety awareness. At EOB, pt trained on circle sit method of sock and shoe donning to reduce forward lean and fall risk. Pt demonstrated adequate ROM on Rt LE but reported pain in LLE when attempting position. At EOB table level, therapist facilitated therapeutic activity involving sorting pages according to color and placing in 3 ring binder. Therapist challenged left visual field perception through positioning self and sorting materials to the left.Pt exhibited sufficient dynamic sitting balance to complete task and was able to sort by color but was unable to realize when pages were turned upside down despite mod verbal cues to attend to orientation.  Task challenge increased by requiring aprox 20 pages to be sorted and bound in notebook in standing, requiring mod A for sit>stand, and assistance ranging from supervision to mod A during standing depending on weight shift. During standing activity, therapist blocked pts lt knee to prevent hyperextension. Therapy Documentation Precautions:  Precautions Precautions: Fall Restrictions Weight Bearing Restrictions: No General:   Vital Signs: Therapy Vitals Temp: 98.2 F (36.8 C) Temp Source: Oral Pulse Rate: 65 Resp: 18 BP: (!) 144/52  mmHg Patient Position (if appropriate): Sitting Oxygen Therapy SpO2: 98 % O2 Device: Not Delivered Pain: Pain Assessment Pain Assessment: No/denies pain  See FIM for current functional status  Therapy/Group: Individual Therapy  Demetrica Zipp 07/08/2014, 3:23 PM

## 2014-07-08 NOTE — Progress Notes (Signed)
Physical Therapy Session Note  Patient Details  Name: Tracy [name] MRN: [id number] Date of Birth: 1926-07-15  Today's Date: 07/08/2014 PT Individual Time: 1400-1430 PT Individual Time Calculation (min): 30 min   Short Term Goals: Week 1:  PT Short Term Goal 1 (Week 1): STG's = LTG's secondary to anticipated LOS.  Skilled Therapeutic Interventions/Progress Updates:    Pt received seated in w/c. Session focused on functional transfers and cognitive remediation with focus on safety awareness. Transported pt to ortho gym (to decrease external distractions) with total A for time management. Pt performed blocked practice of squat pivot transfers (secondary to L genu recurvatum with standing) from w/c <> mat table with min A, verbal cueing focused on transfer setup, safety awareness. Pt initially required 100% cueing for safe transfer setup, awareness of LLE, and safety awareness. During final transfer, pt required 50% cueing for setup, safety. Transported pt partial distance to room in w/c with total A for time management. Pt performed w/c mobility for final 125' with bilat UE's and min A for technique with turning, mod cueing for obstacle negotiation on L side. During w/c mobility, pt required mod cueing for path finding to return to room. Pt left seated in w/c with all needs within reach.  Therapy Documentation Precautions:  Precautions Precautions: Fall Precaution Comments: L inattention, L genu recurvatum with standing/gait Restrictions Weight Bearing Restrictions: No Vital Signs: Therapy Vitals Temp: 98.2 F (36.8 C) Temp Source: Oral Pulse Rate: 65 Resp: 18 BP: (!) 144/52 mmHg Patient Position (if appropriate): Sitting Oxygen Therapy SpO2: 98 % O2 Device: Not Delivered Pain: Pain Assessment Pain Assessment: No/denies pain Locomotion : Wheelchair Mobility Distance: 125   See FIM for current functional status  Therapy/Group: Individual Therapy  Calvert CantorHobble, Blair A 07/08/2014,  6:26 PM

## 2014-07-08 NOTE — Progress Notes (Signed)
Recreational Therapy Assessment and Plan  Patient Details  Name: Tracy [name] MRN: [id number] Date of Birth: 05-23-1926 Today's Date: 07/08/2014  Rehab Potential: Good ELOS: 7 days   Assessment Clinical Impression:  Problem List:  Patient Active Problem List   Diagnosis Date Noted  . Hip pain, acute   . Essential hypertension   . 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 Bradycardia 07/01/2014  . Elevated troponin 07/01/2014  . Dysuria 07/01/2014  . Constipation 07/01/2014  . Stroke 07/01/2014  . Right ankle pain 01/29/2014  . Rotator cuff tear arthropathy of right shoulder 06/19/2013  . Left rotator cuff tear arthropathy 05/17/2013  . Fatigue 02/15/2012  . Aneurysm of iliac artery 01/26/2012  . Peripheral vascular disease, unspecified 01/26/2012  . Left shoulder pain 02/09/2011  . Preventative health care 08/02/2010  . BRADYCARDIA 05/29/2010  . SICK SINUS SYNDROME 03/10/2010  . CAROTID STENOSIS 03/10/2010  . PVD WITH CLAUDICATION 02/23/2010  . CONSTIPATION 10/01/2008  . NECK MASS 10/01/2008  . Diabetes 08/01/2007  . DIVERTICULOSIS, COLON 08/01/2007  . SHOULDER PAIN, RIGHT 08/01/2007  . COLONIC POLYPS, HX OF 08/01/2007  . GERD 12/25/2006  . OSTEOPENIA 12/25/2006  . CEREBROVASCULAR ACCIDENT, HX OF 12/25/2006  . Hyperlipidemia 11/29/2006  . Essential hypertension 11/29/2006    Past Medical History:  Past Medical History  Diagnosis Date  . Hyperlipidemia   . HTN (hypertension)   . GERD (gastroesophageal reflux disease)   . CVA (cerebrovascular accident) 2002  . Osteopenia   . Hx of adenomatous colonic polyps 2007  . Diverticulosis of colon   . DM (diabetes mellitus), type 2   . PVD (peripheral vascular disease)     recently dx w/ carotid -left int carotid >90% 50-60% rt int carotid 02/2010   . PVD (peripheral vascular disease)     lf ABI 0.11 and rt 0.43  . CAROTID STENOSIS   . DIABETES MELLITUS, TYPE II   . DIVERTICULOSIS, COLON   . GERD   . HYPERLIPIDEMIA   . OSTEOPENIA   . Atrial fibrillation   . DVT (deep venous thrombosis)   . Sinus node dysfunction 2011  . Stroke   . Hypertension    Past Surgical History:  Past Surgical History  Procedure Laterality Date  . Cns vascular stent per neurosurgeon    . Tubal ligation    . Right shoulder surgery  2010  . Cataract eye surgery    . Lazer eye surgery    . Iliac artery stent  2011    Bilateral CIA stent  . Carotid endarterectomy      Assessment & Plan Clinical Impression: Patient is a 81 y.o. year old female withhistory of HTN, SSS, mini-stroke 2002 with STM loss; who presented to ED with complaints of progressive left sided numbness left sided numbness and weakness with subsequent fall and difficulty walking by am of 07/01/14. Family member insisted on bringing her to ED for evaluation and patient was found to be bradycardic with HR 40-50s. CT head showed chronic bilateral occipital and left cerebellar infarcts and negative for acute changes. MRI/MRA brain done revealing 1 cm acute infarction in the right lateral thalamus, extensive old ischemic changes throughout the brain and no antegrade flow in R-VA. 2D echo with EF 60-655 with mild mitral calcification and moderately dilated right atrium. Carotid dopplers without significant ICA stenosis. Pelvic films done due to complaints of left hip pain and revealed mild bilateral hip OA. Patient continues to have bouts of bradycardia  but has refused PPM in the past. Dr. Erlinda Hong felt that patient had thrombotic stroke due to SVD and recommends compliance with antiplatelet medications (ASA, plavix, trental). Loop recorder not needed and no additional monitoring at this time per consult by Dr. Rayann Heman. Patient  transferred to CIR on 07/04/2014.      Pt presents with decreased activity tolerance, decreased functional mobility, decreased balance  Limiting pt's independence with leisure/community pursuits.   Leisure History/Participation Premorbid leisure interest/current participation: [place] - Flower gardening;Community - Shopping mall;Community - Grocery store;Community - Travel (Comment) (rummy 500) Other Leisure Interests: Television;Cooking/Baking;Housework Leisure Participation Style: With Family/Friends Awareness of Community Resources: Good-identify 3 post discharge leisure resources Psychosocial / Spiritual Spiritual Interests: Church Does patient have pets?: No Social interaction - Mood/Behavior: Cooperative Engineer, drilling for Education?: Yes Recreational Therapy Orientation Orientation -Reviewed with patient: Available activity resources Strengths/Weaknesses Patient Strengths/Abilities: Willingness to participate;Active premorbidly Patient weaknesses: Physical limitations TR Patient demonstrates impairments in the following area(s): Endurance;Motor;Safety TR Additional Impairment(s): None  Plan Rec Therapy Plan Is patient appropriate for Therapeutic Recreation?: Yes Rehab Potential: Good Treatment times per week: Min 1 time per week >20 minutes Estimated Length of Stay: 7 days TR Treatment/Interventions: Adaptive equipment instruction;1:1 session;Balance/vestibular training;Functional mobility training;Community reintegration;Patient/family education;Therapeutic activities;Recreation/leisure participation;Therapeutic exercise;UE/LE Coordination activities  Recommendations for other services: None  Discharge Criteria: Patient will be discharged from TR if patient refuses treatment 3 consecutive times without medical reason.  If treatment goals not met, if there is a change in medical status, if patient makes no progress towards goals or if patient  is discharged from hospital.  The above assessment, treatment plan, treatment alternatives and goals were discussed and mutually agreed upon: by patient  [place] 07/08/2014, 4:20 PM

## 2014-07-09 ENCOUNTER — Inpatient Hospital Stay (HOSPITAL_COMMUNITY): Payer: Self-pay | Admitting: Occupational Therapy

## 2014-07-09 ENCOUNTER — Inpatient Hospital Stay (HOSPITAL_COMMUNITY): Payer: Medicare Other | Admitting: Occupational Therapy

## 2014-07-09 ENCOUNTER — Inpatient Hospital Stay (HOSPITAL_COMMUNITY): Payer: Medicare Other | Admitting: *Deleted

## 2014-07-09 ENCOUNTER — Inpatient Hospital Stay (HOSPITAL_COMMUNITY): Payer: Self-pay | Admitting: Rehabilitation

## 2014-07-09 NOTE — Progress Notes (Signed)
Occupational Therapy Session Note  Patient Details  Name: Tracy [name] MRN: [id number] Date of Birth: Sep 30, 1926  Today's Date: 07/09/2014 OT Individual Time: [id number] OT Individual Time Calculation (min): 66 min    Short Term Goals: Week 1:  OT Short Term Goal 1 (Week 1): STG= LTGs secondary to short estimated LOS  Skilled Therapeutic Interventions/Progress Updates:   Therapist facilitated skilled session focusing on ADL retraining and safety awareness. Pt completed bathing with supervision seated in w/c at sink level. Req Min A during standing to pull up underwear/pants. Directed care appropriately, though impulsivity to attempt sit> stand without regard for foot placement persisted. Discussed possibility of showering due to LLE weakness precluding tub/shower transfer at present time. Pt agreed to consider. Also discussed home bathroom set up, reported no grab bars in bathroom. Engaged in bathroom mobility training using RW to ambulate to toilet. Noted lt inattention when maneuvering RW bumping lt wheel on base of rail. Pt educated to visually attend to LLE and lt environment. Therapist engaged pt in side step walker mobility training. Pt able to take 2-3 side steps without reverting to 90 degree turn in walker to ambulate straight ahead. Therapist provided verbal cues and manual facilitation at hips to have pt keep body inside walker frame. Therapist modeled pts ambulation in walker as means to educate on importance of attending to LLE and reducing foot drag.          Therapy Documentation Precautions:  Precautions Precautions: Fall Precaution Comments: L inattention, L genu recurvatum with standing/gait Restrictions Weight Bearing Restrictions: No      See FIM for current functional status  Therapy/Group: Individual Therapy  Jazmin Ley 07/09/2014, 11:17 AM

## 2014-07-09 NOTE — Progress Notes (Signed)
Occupational Therapy Session Note  Patient Details  Name: Tracy FinerOphelia W [name] MRN: [id number] Date of Birth: 1926/08/13  Today's Date: 07/09/2014 OT Individual Time: [id number] OT Individual Time Calculation (min): 28 min   Skilled Therapeutic Interventions/Progress Updates:    Pt worked on Chief of Staffneuromuscular re-education for the LUE and LLE in the therapy gym.  Had pt transfer with min assist from wheelchair to mat.  Focused session on sit to stand and dynamic standing balance while reaching for clothespins using the LUE.  Pt with decreased weightshift to the left in standing as well as decreased LLE knee and hip control.  Mod assist needed at times when pt was squatting and reaching down to the left to retrieve or replace clothespins.  Pt with good use of the grasp and place the clothespins with only slight dysmetria noted.  Performed short distance mobility at end of session with pt demonstrating scissoring gait as well as decreased ability to maintain knee and hip extension with weightshifting to the left.  Returned to room via wheelchair with safety belt in place.   Therapy Documentation Precautions:  Precautions Precautions: Fall Precaution Comments: L inattention, L genu recurvatum with standing/gait Restrictions Weight Bearing Restrictions: No  Vital Signs: Therapy Vitals Temp: 97.9 F (36.6 C) Temp Source: Oral Pulse Rate: 70 Resp: 18 BP: 140/71 mmHg Patient Position (if appropriate): Sitting Oxygen Therapy SpO2: 98 % O2 Device: Not Delivered Pain: Pain Assessment Pain Assessment: No/denies pain ADL: See FIM for current functional status  Therapy/Group: Individual Therapy  Ninah Moccio OTR/L 07/09/2014, 4:07 PM

## 2014-07-09 NOTE — Progress Notes (Signed)
Occupational Therapy Session Note  Patient Details  Name: Tracy FinerOphelia W [name] MRN: [id number] Date of Birth: 16-Dec-1926  Today's Date: 07/09/2014 OT Concurrent Time: 1400-1500 OT Concurrent Time Calculation (min): 60 min   Short Term Goals: Week 1:  OT Short Term Goal 1 (Week 1): STG= LTGs secondary to short estimated LOS  Skilled Therapeutic Interventions/Progress Updates:    Engaged in co-tx with TR (1400-1430) in kitchen with focus on simple meal prep, standing balance, endurance, and attention to LLE and environment during cooking task.  Pt required mod assist with all ambulation with RW and mod-max multimodal cues to increase safety with RW and attention to Lt.  Sidestepping along counter again with focus on safety and attention to Lt.  While food cooking, engaged in card matching task in sitting with items set up to pt's Lt to promote attention to Lt.  Pt dropping cards from Lt hand multiple times secondary to decreased attention and sensation.  Educated on visually attending to LUE and LLE during tasks to compensate for decreased sensation.  Therapy Documentation Precautions:  Precautions Precautions: Fall Precaution Comments: L inattention, L genu recurvatum with standing/gait Restrictions Weight Bearing Restrictions: No General:   Vital Signs: Therapy Vitals Temp: 97.9 F (36.6 C) Temp Source: Oral Pulse Rate: 70 Resp: 18 BP: 140/71 mmHg Patient Position (if appropriate): Sitting Oxygen Therapy SpO2: 98 % O2 Device: Not Delivered Pain: Pain Assessment Pain Assessment: No/denies pain  See FIM for current functional status  Therapy/Group: Individual Therapy  Rosalio LoudHOXIE, Uziah Sorter 07/09/2014, 3:28 PM

## 2014-07-09 NOTE — Progress Notes (Signed)
Recreational Therapy Session Note  Patient Details  Name: Tracy FinerOphelia W [name] MRN: [id number] Date of Birth: 1926-07-06 Today's Date: 07/09/2014  Pain: no c/o Skilled Therapeutic Interventions/Progress Updates: Session focused on kitchen safety & functional mobility during simple meal prep.  Pt required mod assist with ambulation using RW & standing balance, max multimodal cues for safety and attending to the left while preparing cornbread "from scratch"  Therapy/Group: Co-Treatment   Ashutosh Dieguez 07/09/2014, 4:38 PM

## 2014-07-09 NOTE — Progress Notes (Signed)
Physical Therapy Session Note  Patient Details  Name: Tracy [name] MRN: [id number] Date of Birth: 1926/10/29  Today's Date: 07/09/2014 PT Individual Time: 1100-1200 PT Individual Time Calculation (min): 60 min   Short Term Goals: Week 1:  PT Short Term Goal 1 (Week 1): STG's = LTG's secondary to anticipated LOS.  Skilled Therapeutic Interventions/Progress Updates:  Pt received in w/c in room; agreeable to therapy.  Session focused on w/c mobility, w/c<>transfers (for carry-over from previous PT session via blocked practice), and NMR (facilitated weight bearing on L LE).  Pt propelled w/c with B UEs >100' to/from room for UE strengthening and for increased attention to L UE and environment, with min A and mod cues to attend to left side.  Pt performed w/c <> mat transfers with 50% cueing from SPT, demonstrating partial carry over from previous PT session.  Pt continues to be impulsive and requires cues for safe transfer set up. ACE bandaged donned on L LE to prevent genu recurvatum and facilitate functional L ankle dorsiflexion and eversion during sit <> stands.  While in standing, performed mini squats for increased graded movement and LLE control with use of mirror for increased visual feedback.  Pt performed multiple sit <> stands, utilizing a 2 inch step under R foot to facilitate greater weight bearing on L LE and prevent compensation patterns.  Provided facilitation at trunk for increased forward weight shift and trunk lean, as well as her using BUEs on LLE to stand to further increase WB and weight shift to LLE.  Following therapy session, pt was left in w/c in room with family present, quick-release belt donned, and all needs within reach.        Therapy Documentation Precautions:  Precautions Precautions: Fall Precaution Comments: L inattention, L genu recurvatum with standing/gait Restrictions Weight Bearing Restrictions: No Vital Signs: Therapy Vitals Temp: 97.9 F (36.6  C) Temp Source: Oral Pulse Rate: 70 Resp: 18 BP: 140/71 mmHg Patient Position (if appropriate): Sitting Oxygen Therapy SpO2: 98 % O2 Device: Not Delivered Pain: Pain Assessment Pain Assessment: No/denies pain Locomotion : Wheelchair Mobility Distance: 125   See FIM for current functional status  Therapy/Group: Individual Therapy  Vista Deckarcell, Mirra Basilio Ann 07/09/2014, 2:04 PM

## 2014-07-10 ENCOUNTER — Inpatient Hospital Stay (HOSPITAL_COMMUNITY): Payer: Medicare Other | Admitting: Physical Therapy

## 2014-07-10 ENCOUNTER — Inpatient Hospital Stay (HOSPITAL_COMMUNITY): Payer: Medicare Other | Admitting: Occupational Therapy

## 2014-07-10 ENCOUNTER — Inpatient Hospital Stay (HOSPITAL_COMMUNITY): Payer: Self-pay | Admitting: Occupational Therapy

## 2014-07-10 NOTE — Progress Notes (Signed)
Physical Therapy Session Note  Patient Details  Name: Tracy [name] MRN: [id number] Date of Birth: [birth year]  Today's Date: 07/10/2014 PT Individual Time: 0830-0930 PT Individual Time Calculation (min): 60 min   Short Term Goals: Week 1:  PT Short Term Goal 1 (Week 1): STG's = LTG's secondary to anticipated LOS.  Skilled Therapeutic Interventions/Progress Updates:    Pt received seated in w/c; awake and ready for therapy. Session focused on gait training, increasing proximal stability and LUE/LLE co-contraction. Pt performed gait x100' in controlled environment with rolling walker and min A, control of L genu recurvatum during ~50% of gait trial, which ended secondary to fatigue; tactile cueing at L gluteus maximus/medius to promote L hip protraction. During second half of gait trial, pt required frequent manual assist at L knee to prevent genu recurvatum. Transported pt to treatment gym in w/c with total A for energy conservation. Pt performed stand pivot transfer from w/c <> mat table with mod A (lift/lower assist), cueing for safety awareness. Pt also performed sit <> stand transfers from mat table/to w/c with rolling walker and mod A (lift/lower assist), cueing for safe use of device. See below for detailed description of NMR interventions.  Pt performed gait x60' in controlled environment with rolling walker (with min to mod A for stability, cueing as described above) while concurrently searching for sticky notes placed at eye-level on L side of hallway to promote visual attention to L visual field. Session ended in pt room, where pt was left seated in w/c with all needs within reach.  Therapy Documentation Precautions:  Precautions Precautions: Fall Precaution Comments: L inattention, L genu recurvatum with standing/gait Restrictions Weight Bearing Restrictions: No Pain:  No c/o pain during this session. Locomotion : Ambulation Ambulation/Gait Assistance: 3: Mod assist;4: Min  Lawyerassist Wheelchair Mobility Distance: 45  NMR:  Neuromuscular Facilitation: Right;Left;Lower Extremity;Forced use;Upper Extremity;Activity to increase coordination;Activity to increase anterior-posterior weight shifting;Activity to increase lateral weight shifting;Activity to increase sustained activation Weight Bearing Technique Weight Bearing Technique: Yes LUE Weight Bearing Technique: High kneeling;Quadruped Seated EOM, pt performed LUE reaching across midline to receive horseshoe, then anterolaterally to hang horseshoe on chair back for forced LLE weightbearing, forced use of LUE. Progressed pt to hanging horseshoe on modified basketball goal (positioned anterior/to L of pt) to promote LLE weightbearing during sit > partial stand. Tactile cueing provided at L knee for increased weightbearing, proprioception. Pt performed functional LUE reaching in tall kneeling (for increased proximal stability, selective control of L hip extension with L knee flexion) with intermittent [name] assist at [facility] bench. In tall kneeling, pt required verbal reminders and tactile cueing at L ribcage and anterior L shoulder to promote upright posture, close supervision to min guard required for majority of tall kneeling, mod A required for pt to recover from frequent LOB to L side with LUE anterolateral reaching. Pt required rest breaks x2 secondary to fatigue. Transitioned to quadruped for LUE/LLE co-contraction, proprioceptive input, and increased attention to L hemibody. Pt tolerated quadruped for < 5 consecutive minutes prior to requiring rest break due to fatigue.  See FIM for current functional status  Therapy/Group: Individual Therapy  Hobble, Lorenda IshiharaBlair A 07/10/2014, 11:08 AM

## 2014-07-10 NOTE — Patient Care Conference (Signed)
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 Hip pain, acute   . Essential hypertension   . 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 Bradycardia 07/01/2014  . Elevated troponin 07/01/2014  . Dysuria 07/01/2014  . Constipation 07/01/2014  . Stroke 07/01/2014  . Right ankle pain 01/29/2014  . Rotator cuff tear arthropathy of right shoulder 06/19/2013  . Left rotator cuff tear arthropathy 05/17/2013  . Fatigue 02/15/2012  . Aneurysm of iliac artery 01/26/2012  . Peripheral vascular disease, unspecified 01/26/2012  . Left shoulder pain 02/09/2011  . Preventative health care 08/02/2010  . BRADYCARDIA 05/29/2010  . SICK SINUS SYNDROME 03/10/2010  . CAROTID STENOSIS 03/10/2010  . PVD WITH CLAUDICATION 02/23/2010  . CONSTIPATION 10/01/2008  . NECK MASS 10/01/2008  . Diabetes 08/01/2007  . DIVERTICULOSIS, COLON 08/01/2007  . SHOULDER PAIN, RIGHT 08/01/2007  . COLONIC POLYPS, HX OF 08/01/2007  . GERD 12/25/2006  . OSTEOPENIA 12/25/2006  . CEREBROVASCULAR ACCIDENT, HX OF 12/25/2006  . Hyperlipidemia 11/29/2006  . Essential hypertension 11/29/2006    Expected Discharge Date: Expected Discharge Date: 07/18/14  Team Members Present: Physician leading conference: Dr. Claudette LawsAndrew Kirsteins Social Worker Present: Dossie DerBecky Zebbie Ace,  LCSW Nurse Present: Carmie EndAngie Joyce, RN PT Present: Jorje GuildBlair Hobble, Lillie ColumbiaPT;Caroline Cook, PT OT Present: Rosalio LoudSarah Hoxie, Kirkland HunT;Julia Saguier, OT PPS Coordinator present : Tora DuckMarie Noel, RN, CRRN     Current Status/Progress Goal Weekly Team Focus  Medical   son with questions related to PPM, EP does not recommend, poor awareness LLE, recurvatum  Home with family assist  assess for bracing needs   Bowel/Bladder   continent of bowell and bladder  supervision  remain continent of bowel and bladder   Swallow/Nutrition/ Hydration     na        ADL's   Supervision with ADLs at seated level, requiring min-mod assist with standing balance.  Impulsivity with standing and Lt inattention  Mod I or supervision/cueing   Attending to left environment, dynamic sitting and standing balance   Mobility   Supervision bed mobility, Mod A transfers, Min to Mod A ambulation  Ambulatory at supervision level  Attention to L hemibody/visual field, motor control in L knee, postural control/standing balance, safety awareness, initiate family education/training   Communication     na        Safety/Cognition/ Behavioral Observations    no unsafe behaviors        Pain   No current c/o pain  <2 on a 0-10 scale  assess pain q 4hr and medicate as needed   Skin   Abdonimal bruising d/t lovenox injections.  remain free from infection/breakdown while on rehab.  assess skin q shift      *See Care Plan and progress notes for long and short-term goals.  Barriers to Discharge: Will need 24/7 sup  on D/C    Possible Resolutions to Barriers:  educate and train caregivers    Discharge Planning/Teaching Needs:  Home with son whom lives with her and other children assisting      Team Discussion:  l-inattention and knee control issues.  Also impulsive in her movements. Tolerating diet and back to baseline with cognition so SP did not pick up. Doing well and goals -supervison-mod/i level.  MD addressing pacemaker issue  Revisions to  Treatment Plan:  None   Continued Need for Acute Rehabilitation Level of Care: The patient requires daily medical management by a physician with specialized training in physical medicine and rehabilitation for the following conditions: Daily direction of a multidisciplinary physical rehabilitation program to ensure safe treatment while eliciting the highest outcome that is of practical value to the patient.: Yes Daily medical management of patient stability for increased activity during participation in an intensive rehabilitation regime.: Yes Daily analysis of laboratory values and/or radiology reports with any subsequent need for medication adjustment of medical intervention for : Neurological problems  [name], [name] 07/11/2014, 8:50 AM

## 2014-07-10 NOTE — Progress Notes (Signed)
Physical Therapy Session Note  Patient Details  Name: Tracy [name] MRN: [id number] Date of Birth: 1926/06/09  Today's Date: 07/10/2014 PT Individual Time: [id number] PT Individual Time Calculation (min): 34 min   Short Term Goals: Week 1:  PT Short Term Goal 1 (Week 1): STG's = LTG's secondary to anticipated LOS.  Skilled Therapeutic Interventions/Progress Updates:  Pt received in w/c in room; agreeable to therapy. Therapy session focused on gait training and NMR (facilitated weight bearing on L LE). Pt propelled w/c with B UEs 50' for UE strengthening and increased attention to L environment. Pt ambulated 90' x1 in a controlled environment with rolling walker min A with verbal and tactile cues for sequencing, gait speed, and safety. During L LE advancement, pt demonstrated limited L hip and knee flexion, and L ankle dorsiflexion that resulted in toe drag during R single leg stance (L swing phase). Tactile and manual cues provided for the patient at the pelvis for symmetrical weight bearing through LEs and to activate L hip flexors during swing. Pt engaged in dynamic standing balance activity with a card game and checkers to facilitate weight bearing through L LE while reaching across midline with R UE.  2 inch step placed under R foot to help facilitate L LE weight bearing; tactile cues at pelvis to decrease compensatory strategies. Pt ambulated 45' towards room with RW min A, but required max A one time due to loss of balance. Pt transported total A in w/c back to room due to time constraints and left in w/c in room with all needs within reach.    Therapy Documentation Precautions:  Precautions Precautions: Fall Precaution Comments: L inattention, L genu recurvatum with standing/gait Restrictions Weight Bearing Restrictions: No Vital Signs: Therapy Vitals Temp: 97.6 F (36.4 C) Temp Source: Oral Pulse Rate: 74 Resp: 18 BP: 135/67 mmHg Patient Position (if appropriate):  Sitting Oxygen Therapy SpO2: 99 % O2 Device: Not Delivered Pain: Pain Assessment Pain Assessment:  (Entry made in error) Locomotion : Ambulation Ambulation/Gait Assistance: 2: Max assist (Max assist to recover from loss of balance) Wheelchair Mobility Distance: 50   See FIM for current functional status  Therapy/Group: Individual Therapy  Bart Ashford 07/10/2014, 5:07 PM

## 2014-07-10 NOTE — Progress Notes (Signed)
Occupational Therapy Session Note  Patient Details  Name: Tracy FinerOphelia W [name] MRN: [id number] Date of Birth: November 30, 1926  Today's Date: 07/10/2014 OT Concurrent Time: 1400-1500 OT Concurrent Time Calculation (min): 60 min   Short Term Goals: Week 1:  OT Short Term Goal 1 (Week 1): STG= LTGs secondary to short estimated LOS  Skilled Therapeutic Interventions/Progress Updates:    Treatment session with focus on Lt attention, standing balance, and functional mobility with RW.  Engaged in card activity at table with focus on core and LLE strengthening in standing with pt providing instructions to activity from memory.  Pt required intermittent stability at Lt knee in standing with blocking in back and front to promote stability.  Pt required multiple rest breaks during standing activity.  Ambulated >150 feet back to pt room at end of session with RW with 1 rest break and min assist with multimodal cues to increase attention to LLE during ambulation to decrease fall risk.  Pt with improved step through pattern this session.  Therapy Documentation Precautions:  Precautions Precautions: Fall Precaution Comments: L inattention, L genu recurvatum with standing/gait Restrictions Weight Bearing Restrictions: No Pain:  Pt with no c/o pain  See FIM for current functional status  Therapy/Group: Individual Therapy  Rosalio LoudHOXIE, Charlotte Fidalgo 07/10/2014, 3:12 PM

## 2014-07-10 NOTE — Progress Notes (Signed)
Social Work Patient ID: Tracy [name], female   DOB: 05-20-1926, 81 y.o.   MRN: [id number] Met with pt and tow children-son and daughter who are here to hear team conference goals-supervision level and discharge 4/14.  Pt was disappointed it was not sooner. She was wanting to go home tomorrow.  Discussed with daughter the need for her brother to come in and attend therapies so can see what Mom needs assist with.  She will talk with him about this.  Assured pt that is she progresses more quickly the discharge date can be moved up. She felt better about this.  Work on discharge plans.

## 2014-07-10 NOTE — Progress Notes (Signed)
Occupational Therapy Session Note  Patient Details  Name: Horton FinerOphelia W Klecka MRN: [id number] Date of Birth: [birth year]  Today's Date: 07/10/2014 OT Individual Time: 1300-1400 OT Individual Time Calculation (min): 60 min    Short Term Goals: Week 1:  OT Short Term Goal 1 (Week 1): STG= LTGs secondary to short estimated LOS  Skilled Therapeutic Interventions/Progress Updates:    1:1  NMR to focus on hip, core left LE/UE strength and coordination through standing task while engaged in game of [place] with instructional cues for directions and keeping up with scoring, functional ambulation with RW with focus on hip facilitation normal movement and attention to left foot positioning to decr fall risk with RW. Transitioned to mat and worked in tall kneeling, quadruped, with focus on:Forced use UE activation of left LE;Activity to increase coordination;Activity to increase anterior-posterior weight shifting;Activity to increase lateral weight shifting;Activity to increase sustained activation. Pt left on side of mat with next therapist.   Therapy Documentation Precautions:  Precautions Precautions: Fall Precaution Comments: L inattention, L genu recurvatum with standing/gait Restrictions Weight Bearing Restrictions: No Pain:  slight discomfort in left thigh with NMR activity- rest relieved discomfort.    Other Treatments: Treatments Neuromuscular Facilitation: Right;Left;Lower Extremity;Forced use;Upper Extremity;Activity to increase coordination;Activity to increase anterior-posterior weight shifting;Activity to increase lateral weight shifting;Activity to increase sustained activation Weight Bearing Technique Weight Bearing Technique: Yes LUE Weight Bearing Technique: High kneeling;Quadruped  See FIM for current functional status  Therapy/Group: Individual Therapy  Roney MansSmith, Catcher Dehoyos [facility] 07/10/2014, 2:42 PM

## 2014-07-10 NOTE — Progress Notes (Signed)
[facility]     PROGRESS NOTE    Subjective/Complaints: Amb with Min A to PT gym ~150' No Pain c/os Per PT recurvatum Left knee Review of Systems - Negative except LLE weakness in addition to above  Objective: Vital Signs: Blood pressure 154/81, pulse 62, temperature 98.1 F (36.7 C), temperature source Oral, resp. rate 18, SpO2 100 %. No results found. No results for input(s): WBC, HGB, HCT, PLT in the last 72 hours. No results for input(s): NA, K, CL, GLUCOSE, BUN, CREATININE, CALCIUM in the last 72 hours.  Invalid input(s): CO CBG (last 3)  No results for input(s): GLUCAP in the last 72 hours.  Wt Readings from Last 3 Encounters:  07/01/14 52.617 kg (116 lb)  05/17/14 49.896 kg (110 lb)  03/26/14 50.349 kg (111 lb)    Physical Exam:  Constitutional: She is oriented to person, place, and time. She appears well-developed and well-nourished.  HENT:  Head: Normocephalic and atraumatic.  Eyes: Conjunctivae are normal. Pupils are equal, round, and reactive to light.  Neck: Normal range of motion. Neck supple.  Cardiovascular: Normal rate.  Respiratory: Effort normal and breath sounds normal. No respiratory distress. She has no wheezes.  GI: Soft. Bowel sounds are normal. She exhibits no distension. There is no tenderness.  Musculoskeletal: She exhibits no edema or tenderness.  Neurological: She is alert and oriented to person, place, and time. Reasonable insight and awareness Able to follow 1 and 2 step commands without difficulty. Speech generally clear. Mild left facial weakness. Left hemiparesis with ataxia and sensory deficits. LUE 4/5 prox to distal. LLE: 2/5hf, 3/5 ke and 3+ to 4 adf/apf. Sensory 1+/2 left face, 1+/2 fingers/left hand, 1+/2 prox left leg 1/2 distal leg and foot  Skin: Skin is warm and dry.  Psych: pleasant, cooperative  Assessment/Plan: 1. Functional deficits secondary to right thalamic infarction which  require 3+ hours per day of interdisciplinary therapy in a comprehensive inpatient rehab setting. Physiatrist is providing close team supervision and 24 hour management of active medical problems listed below. Physiatrist and rehab team continue to assess barriers to discharge/monitor patient progress toward functional and medical goals. Team conference today please see physician documentation under team conference tab, met with team face-to-face to discuss problems,progress, and goals. Formulized individual treatment plan based on medical history, underlying problem and comorbidities. FIM: FIM - Bathing Bathing Steps Patient Completed: Chest, Right Arm, Left Arm, Abdomen, Front perineal area, Right upper leg, Left upper leg, Right lower leg (including foot), Left lower leg (including foot), Buttocks Bathing: 4: Steadying assist  FIM - Upper Body Dressing/Undressing Upper body dressing/undressing steps patient completed: Thread/unthread right bra strap, Thread/unthread left bra strap, Thread/unthread right sleeve of pullover shirt/dresss, Thread/unthread left sleeve of pullover shirt/dress, Put head through opening of pull over shirt/dress, Pull shirt over trunk, Hook/unhook bra Upper body dressing/undressing: 5: Set-up assist to: Obtain clothing/put away FIM - Lower Body Dressing/Undressing Lower body dressing/undressing steps patient completed: Thread/unthread right underwear leg, Thread/unthread left underwear leg, Thread/unthread right pants leg, Thread/unthread left pants leg, Don/Doff right sock, Don/Doff left sock, Don/Doff right shoe, Fasten/unfasten right shoe, Pull underwear up/down, Pull pants up/down, Fasten/unfasten left shoe, Don/Doff left shoe Lower body dressing/undressing: 4: Steadying Assist  FIM - Toileting Toileting steps completed by patient: Performs perineal hygiene Toileting Assistive Devices: Grab bar or rail for support Toileting: 2: Max-Patient completed 1 of 3  steps  FIM - Radio producer Devices: Grab bars Toilet Transfers: 4-To toilet/BSC:  Min A (steadying Pt. > 75%)  FIM - Bed/Chair Transfer Bed/Chair Transfer Assistive Devices: Arm rests Bed/Chair Transfer: 4: Bed > Chair or W/C: Min A (steadying Pt. > 75%), 4: Chair or W/C > Bed: Min A (steadying Pt. > 75%)  FIM - Locomotion: Wheelchair Distance: 125 Locomotion: Wheelchair: 2: Travels 50 - 149 ft with minimal assistance (Pt.>75%) FIM - Locomotion: Ambulation Locomotion: Ambulation Assistive Devices: Administrator, Orthosis (Youth rolling walker; swedish knee cage) Ambulation/Gait Assistance: 1: +2 Total assist (+2 for w/c follow) Locomotion: Ambulation: 0: Activity did not occur  Comprehension Comprehension Mode: Auditory Comprehension: 5-Follows basic conversation/direction: With extra time/assistive device  Expression Expression Mode: Verbal Expression: 5-Expresses basic 90% of the time/requires cueing < 10% of the time.  Social Interaction Social Interaction: 6-Interacts appropriately with others with medication or extra time (anti-anxiety, antidepressant).  Problem Solving Problem Solving: 5-Solves basic 90% of the time/requires cueing < 10% of the time  Memory Memory: 5-Recognizes or recalls 90% of the time/requires cueing < 10% of the time  Medical Problem List and Plan: 1. Functional deficits secondary to Right thalamic infarction with profound left hemisensory deficits (esp left leg) and left hemiparesis 2. DVT Prophylaxis/Anticoagulation: Pharmaceutical: Lovenox 3. Left hip pain/Pain Management: Prn Vicodin  4. Mood: LCSW to follow for evaluation and support.  5. Neuropsych: This patient is capable of making decisions on her own behalf. 6. Skin/Wound Care: routine pressure relief measures.  7. Fluids/Electrolytes/Nutrition: Monitor I/O. Offer supplements between meals. BMET normal today    8.CV- last EP and cardiology consults  3/30 and 07/04/14 did not rec PPM 9. HTN: Monitor BP every 8 hours. Blood pressures trending up and lisinopril resumed today.  10. CKD: Baseline Cr-1.31. (0.88 today) LOS (Days) 6 A FACE TO FACE EVALUATION WAS PERFORMED  KIRSTEINS,ANDREW E 07/10/2014 10:06 AM

## 2014-07-11 ENCOUNTER — Inpatient Hospital Stay (HOSPITAL_COMMUNITY): Payer: Self-pay | Admitting: Occupational Therapy

## 2014-07-11 ENCOUNTER — Inpatient Hospital Stay (HOSPITAL_COMMUNITY): Payer: Self-pay | Admitting: Physical Therapy

## 2014-07-11 DIAGNOSIS — I69898 Other sequelae of other cerebrovascular disease: Secondary | ICD-10-CM

## 2014-07-11 DIAGNOSIS — R208 Other disturbances of skin sensation: Secondary | ICD-10-CM

## 2014-07-11 LAB — CREATININE, SERUM
Creatinine, Ser: 0.99 mg/dL (ref 0.50–1.10)
GFR calc Af Amer: 59 mL/min — ABNORMAL LOW (ref >90)
GFR, EST NON AFRICAN AMERICAN: 51 mL/min — AB (ref >90)

## 2014-07-11 NOTE — Progress Notes (Signed)
Occupational Therapy Session Note  Patient Details  Name: Tracy [name] MRN: [id number] Date of Birth: Nov 04, 1926  Today's Date: 07/11/2014 OT Individual Time: [id number] OT Individual Time Calculation (min): 57 min    Short Term Goals: Week 1:  OT Short Term Goal 1 (Week 1): STG= LTGs secondary to short estimated LOS  Skilled Therapeutic Interventions/Progress Updates:    Pt rolled herself down to the therapy gym in her wheelchair with min assist.  Pt with increased drifting to the left side as she used her UEs to propel the wheelchair.  Pt's son Al also accompanying us down to the gym.  Transferred to therapy mat with min assist stand pivot.  Focused session on LUE FM and gross motor coordination while incorporating standing balance and sit to stand.  Mod facilitation needed for balance when standing as pt demonstrates increased lean and LOB to the left side.  Still with increased knee hyperextension on the LLE in standing as well as occasional knee buckling.  Had pt work on reaching down and up for change of weight shifts while using the LUE as well to pick up and place checkers.  Transitioned to pre-gait activities having pt stepping forward and backward with each LE.  Pt still with decreased anterior weightshift with advancement of the left foot as well as demonstrating increased scissoring.  Had pt ambulate back to the room without use of assistive device with therapist providing mod facilitation at the pelvis and trunk.  Provided education and handout on LUE FM coordination tasks.   Therapy Documentation Precautions:  Precautions Precautions: Fall Precaution Comments: L inattention, L genu recurvatum with standing/gait Restrictions Weight Bearing Restrictions: No  Pain: Pain Assessment Pain Assessment: No/denies pain ADL: See FIM for current functional status  Therapy/Group: Individual Therapy  Avry Roedl 07/11/2014, 12:20 PM

## 2014-07-11 NOTE — Progress Notes (Signed)
[facility] PHYSICAL MEDICINE & REHABILITATION     PROGRESS NOTE    Subjective/Complaints: Pt slept poorly, disappointed that her d/c date is next week Discussed cardiology recs Review of Systems - Negative except LLE weakness in addition to above  Objective: Vital Signs: Blood pressure 136/38, pulse 59, temperature 98.5 F (36.9 C), temperature source Oral, resp. rate 17, weight 47.537 kg (104 lb 12.8 oz), SpO2 95 %. No results found. No results for input(s): WBC, HGB, HCT, PLT in the last 72 hours.  Recent Labs  07/11/14 0620  CREATININE 0.99   CBG (last 3)  No results for input(s): GLUCAP in the last 72 hours.  Wt Readings from Last 3 Encounters:  07/10/14 47.537 kg (104 lb 12.8 oz)  07/01/14 52.617 kg (116 lb)  05/17/14 49.896 kg (110 lb)    Physical Exam:  Constitutional: She is oriented to person, place, and time. She appears well-developed and well-nourished.  HENT:  Head: Normocephalic and atraumatic.  Eyes: Conjunctivae are normal. Pupils are equal, round, and reactive to light.  Neck: Normal range of motion. Neck supple.  Cardiovascular: Normal rate.  Respiratory: Effort normal and breath sounds normal. No respiratory distress. She has no wheezes.  GI: Soft. Bowel sounds are normal. She exhibits no distension. There is no tenderness.  Musculoskeletal: She exhibits no edema or tenderness.  Neurological: She is alert and oriented to person, place, and time. Reasonable insight and awareness Able to follow 1 and 2 step commands without difficulty. Speech generally clear. Mild left facial weakness. Left hemiparesis with ataxia and sensory deficits. LUE 4/5 prox to distal. LLE: 2/5 hf, 3/5 ke and 3+ to 4 adf/apf. Sensory 1+/2 left face, 1+/2 fingers/left hand, 1+/2 prox left leg 1/2 distal leg and foot  Skin: Skin is warm and dry.  Psych: pleasant, cooperative  Assessment/Plan: 1. Functional deficits secondary to right thalamic infarction which require  3+ hours per day of interdisciplinary therapy in a comprehensive inpatient rehab setting. Physiatrist is providing close team supervision and 24 hour management of active medical problems listed below. Physiatrist and rehab team continue to assess barriers to discharge/monitor patient progress toward functional and medical goals.  FIM: FIM - Bathing Bathing Steps Patient Completed: Chest, Right Arm, Left Arm, Abdomen, Front perineal area, Right upper leg, Left upper leg, Right lower leg (including foot), Left lower leg (including foot), Buttocks Bathing: 4: Steadying assist  FIM - Upper Body Dressing/Undressing Upper body dressing/undressing steps patient completed: Thread/unthread right bra strap, Thread/unthread left bra strap, Thread/unthread right sleeve of pullover shirt/dresss, Thread/unthread left sleeve of pullover shirt/dress, Put head through opening of pull over shirt/dress, Pull shirt over trunk, Hook/unhook bra Upper body dressing/undressing: 5: Set-up assist to: Obtain clothing/put away FIM - Lower Body Dressing/Undressing Lower body dressing/undressing steps patient completed: Thread/unthread right underwear leg, Thread/unthread left underwear leg, Thread/unthread right pants leg, Thread/unthread left pants leg, Don/Doff right sock, Don/Doff left sock, Don/Doff right shoe, Fasten/unfasten right shoe, Pull underwear up/down, Pull pants up/down, Fasten/unfasten left shoe, Don/Doff left shoe Lower body dressing/undressing: 4: Steadying Assist  FIM - Toileting Toileting steps completed by patient: Adjust clothing prior to toileting, Performs perineal hygiene, Adjust clothing after toileting Toileting Assistive Devices: Grab bar or rail for support Toileting: 4: Steadying assist  FIM - Diplomatic Services operational officer Devices: Grab bars Toilet Transfers: 4-To toilet/BSC: Min A (steadying Pt. > 75%), 4-From toilet/BSC: Min A (steadying Pt. > 75%)  FIM - Bed/Chair  Transfer Bed/Chair Transfer Assistive Devices: Arm rests Bed/Chair Transfer:  4: Sit > Supine: Min A (steadying pt. > 75%/lift 1 leg), 4: Bed > Chair or W/C: Min A (steadying Pt. > 75%)  FIM - Locomotion: Wheelchair Distance: 50 Locomotion: Wheelchair: 2: Travels 50 - 149 ft with minimal assistance (Pt.>75%) FIM - Locomotion: Ambulation Locomotion: Ambulation Assistive Devices: Designer, industrial/productWalker - Rolling Ambulation/Gait Assistance: 2: Max assist (Max assist to recover from loss of balance) Locomotion: Ambulation: 1: Travels less than 50 ft with maximal assistance (Pt: 25 - 49%)  Comprehension Comprehension Mode: Auditory Comprehension: 5-Follows basic conversation/direction: With extra time/assistive device  Expression Expression Mode: Verbal Expression: 5-Expresses basic 90% of the time/requires cueing < 10% of the time.  Social Interaction Social Interaction: 6-Interacts appropriately with others with medication or extra time (anti-anxiety, antidepressant).  Problem Solving Problem Solving: 5-Solves basic 90% of the time/requires cueing < 10% of the time  Memory Memory: 5-Recognizes or recalls 90% of the time/requires cueing < 10% of the time  Medical Problem List and Plan: 1. Functional deficits secondary to Right thalamic infarction with profound left hemisensory deficits (esp left leg) and left hemiparesis 2. DVT Prophylaxis/Anticoagulation: Pharmaceutical: Lovenox 3. Left hip pain/Pain Management: Prn Vicodin  4. Mood: LCSW to follow for evaluation and support.  5. Neuropsych: This patient is capable of making decisions on her own behalf. 6. Skin/Wound Care: routine pressure relief measures.  7. Fluids/Electrolytes/Nutrition: Monitor I/O. Offer supplements between meals. BMET normal today    8.CV- last EP and cardiology consults 3/30 and 07/04/14 did not rec PPM 9. HTN: Monitor BP every 8 hours. Blood pressures trending up and lisinopril resumed today.  10. CKD: Baseline  Cr-1.31. (0.88 today) LOS (Days) 7 A FACE TO FACE EVALUATION WAS PERFORMED  Erick ColaceKIRSTEINS,ANDREW E 07/11/2014 8:48 AM

## 2014-07-11 NOTE — Progress Notes (Signed)
Physical Therapy Session Note  Patient Details  Name: [name] MRN: [id number] Date of Birth: 1926-06-02  Today's Date: 07/11/2014 PT Individual Time: 1420-1505 PT Individual Time Calculation (min): 45 min   Short Term Goals: Week 1:  PT Short Term Goal 1 (Week 1): STG's = LTG's secondary to anticipated LOS.  Skilled Therapeutic Interventions/Progress Updates:    Pt received seated in w/c accompanied by son; agreeable to therapy. Session focused on gait training and fall recovery. Son present to observe entire session. Donned L Foot Up brace for dorsiflexion assistance and surgical cap at L shoe to decrease friction and decrease L toe drag. Pt performed functional ambulation x180', x75', and x105' in controlled and home environments with rolling walker requiring min [name] during linear gait, mod [name] during turns. L Foot Up brace paired with surgical cap (to simulate leather toe cap) effective in addressing L toe drag during ambulation. Multimodal cueing provided to increase lateral weight shift to R side, to increase L foot clearance during LLE advancement, increase L hip protraction, and to remind pt to maintain normal BOS during turning (secondary to pt tendency toward LLE adduction during turning). In rehab apartment, educated pt and son on fall recovery, situations in which to activate EMS after fall. Explained, demonstrated floor transfer (seated on couch <> supine on mat on floor) for fall recovery and quadruped/tall kneeling position (to elicit LUE/LLE weightbearing, activation, proprioception). Pt then performed floor transfer with min [name] to maintain L knee stability/safety during transitional movements, mod cueing for technique. Session ended in pt room, where pt was left seated in w/c with son present and all needs within reach.  Therapy Documentation Precautions:  Precautions Precautions: Fall Precaution Comments: L inattention, L genu recurvatum with standing/gait Restrictions Weight  Bearing Restrictions: No Vital Signs: Therapy Vitals Temp: 98 F (36.7 C) Temp Source: Oral Pulse Rate: (!) 54 Resp: 18 BP: 133/64 mmHg Patient Position (if appropriate): Sitting Oxygen Therapy SpO2: 99 % O2 Device: Not Delivered Pain: Pain Assessment Pain Assessment: No/denies pain Locomotion : Ambulation Ambulation/Gait Assistance: 3: Mod assist;4: Min assist   See FIM for current functional status  Therapy/Group: Individual Therapy  Tracy [name], Tracy IshiharaBlair [name] 07/11/2014, 5:58 PM

## 2014-07-11 NOTE — Progress Notes (Signed)
Physical Therapy Session Note  Patient Details  Name: Tracy [name] MRN: [id number] Date of Birth: [birth year]  Today's Date: 07/11/2014 PT Individual Time: 1030-1200 PT Individual Time Calculation (min): 90 min   Short Term Goals: Week 1:  PT Short Term Goal 1 (Week 1): STG's = LTG's secondary to anticipated LOS.  Skilled Therapeutic Interventions/Progress Updates:  Pt received in w/c in room; agreeable to therapy.  Session focused on gait in controlled and home environments, and NMR (continued facilitated weight bearing on L LE).  In supine, performed rhythmic initiation L LE PNF D2 Flexion to facilitate L ankle DF and help prevent toe drag during gait.  Pt performed the same movement in standing with rolling walker, min guard and verbal cues for correct movement.  Pt ambulated 50' x3 with rolling walker in controlled environment, requiring mod A. Tactile cues continue to be provided for the patient at the pelvis for symmetrical weight bearing through LEs and to activate L hip flexors during swing.  Pt also required mod verbal cues for step length and safety with rolling walker.  Pt ambulated 25' in ADL apartment to simulate home environment and ambulating with the rolling walker on different surfaces.  Pt performed sit<>stand with rolling walker from couch and bed with min A.  Pt participated in dynamic standing balance activities that involved reaching for cards on the checkers board, and reaching for horseshoes to facilitate weight bearing through L LE.  Pt reached with R UE and placed item across midline.  A 2" step was used during the first activity and a 4" step was used during the second activity under R LE to facilitate weight bearing onto L LE. Pt required manual cues at pelvis for proper weight shift technique and to prevent compensations. Pt also engaged in an NMR activity in quadruped to facilitate weight bearing through both the L UE and LE.  SPT provided manual stabilization at L elbow.   Pt propelled w/c 100' with B UEs to/from gym with min A and min cues to attend to her L side/L environment.  Pt was left in w/c with daughter present and all needs within reach.   Therapy Documentation Precautions:  Precautions Precautions: Fall Precaution Comments: L inattention, L genu recurvatum with standing/gait Restrictions Weight Bearing Restrictions: No Pain: Pain Assessment Pain Assessment: No/denies pain Locomotion : Ambulation Ambulation/Gait Assistance: 3: Mod assist Wheelchair Mobility Distance: 100   See FIM for current functional status  Therapy/Group: Individual Therapy  Narda Fundora 07/11/2014, 12:28 PM

## 2014-07-12 ENCOUNTER — Inpatient Hospital Stay (HOSPITAL_COMMUNITY): Payer: Medicare Other | Admitting: Physical Therapy

## 2014-07-12 ENCOUNTER — Inpatient Hospital Stay (HOSPITAL_COMMUNITY): Payer: Medicare Other | Admitting: Occupational Therapy

## 2014-07-12 NOTE — Progress Notes (Signed)
Physical Therapy Session Note  Patient Details  Name: Tracy [name] MRN: [id number] Date of Birth: [birth year]  Today's Date: 07/12/2014 PT Individual Time: 0930-1030 PT Individual Time Calculation (min): 60 min   Short Term Goals: Week 1:  PT Short Term Goal 1 (Week 1): STG's = LTG's secondary to anticipated LOS.  Skilled Therapeutic Interventions/Progress Updates:  Pt received in w/c in room; agreeable to therapy.  Session focused on gait training with Lite Gait on the treadmill, and functional ambulation overground with rolling walker and min A/min guard.  Pt propelled w/c supervision with B UEs 100' to/from gym for attention to L UE and L environment with min cues to attend to L environment.  Pt ambulated on Lite Gait for a total of 275' (x3 rest breaks) with manual facilitation of L ankle dorsiflexion, approximation at L knee to prevent genu recurvatum, and weight shifting onto R LE during L LE swing. Lite Gait harness biased to facilitate upright posture and lateral weight shifting to R side. Mod verbal cues provided to increase step length on R LE during L LE stance.  Pt transitioned to ambulating overground with rolling walker and min A, demonstrating carry over from lite gait with improved L ankle dorsiflexion during L LE swing phase.  Manual cues provided at pelvis during gait for weight shifting onto R LE and activation of hip flexors during L swing phase (R stance).  Pt ambulated 50' towards room, then transfer to her w/c.  Pt was left in w/c in room with all needs within reach.      Therapy Documentation Precautions:  Precautions Precautions: Fall Precaution Comments: L inattention, L genu recurvatum with standing/gait Restrictions Weight Bearing Restrictions: No Pain: Pain Assessment Pain Assessment: No/denies pain Pain Score: 0-No pain Mobility:   Locomotion : Ambulation Ambulation/Gait Assistance: 4: Min assist;4: Min guard Wheelchair Mobility Distance: 100   See  FIM for current functional status  Therapy/Group: Individual Therapy  Ravin Denardo 07/12/2014, 11:11 AM

## 2014-07-12 NOTE — Progress Notes (Signed)
Physical Therapy Session Note  Patient Details  Name: Tracy [name] MRN: [id number] Date of Birth: 1926/09/14  Today's Date: 07/12/2014 PT Individual Time: 1540-1610 PT Individual Time Calculation (min): 30 min   Short Term Goals: Week 1:  PT Short Term Goal 1 (Week 1): STG's = LTG's secondary to anticipated LOS.  Skilled Therapeutic Interventions/Progress Updates:    Pt received seated in w/c accompanied by daughter, Lynden AngCathy. Session focused on initiating hands-on family training with daughter Lynden AngCathy, who reports she will be assisting patient intermittently at discharge. Explained and demonstrated safe technique for min A and appropriate cueing with the following: sit <> stand transfers from w/c with rolling walker and cueing for proper hand placement; stand pivot transfers from w/c <> toilet with grab bars and cueing for technique, total A for w/c parts management; functional ambulation x100' then x130' in controlled and home environments with rolling walker verbal cueing for safe gait speed, safe proximity to rolling walker, and maintaining normal (wide enough) BOS during turning to prevent L LE scissoring). Daughter, Lynden AngCathy, gave effective return demonstration of safe assist/appropriate cueing with all described aspects of functional mobility. Safety plan updated and RN made aware of daughter's ability to safely assist pt to/from bathroom. Departed with pt seated in w/c with daughter present and all needs within reach.    Therapy Documentation Precautions:  Precautions Precautions: Fall Precaution Comments: L inattention, L genu recurvatum with standing/gait Restrictions Weight Bearing Restrictions: No Vital Signs: Therapy Vitals Temp: 97.8 F (36.6 C) Temp Source: Oral Pulse Rate: (!) 54 Resp: 17 BP: (!) 126/47 mmHg Patient Position (if appropriate): Sitting Oxygen Therapy SpO2: 99 % O2 Device: Not Delivered Pain: Pain Assessment Pain Assessment: No/denies pain Locomotion  : Ambulation Ambulation/Gait Assistance: 4: Min assist   See FIM for current functional status  Therapy/Group: Individual Therapy  Ella Golomb, Lorenda IshiharaBlair A 07/12/2014, 4:20 PM

## 2014-07-12 NOTE — Progress Notes (Signed)
[facility] PHYSICAL MEDICINE & REHABILITATION     PROGRESS NOTE    Subjective/Complaints: No issues overnite Tolerating therapy Discussed cardiology recs Review of Systems - Negative except LLE weakness in addition to above  Objective: Vital Signs: Blood pressure 110/66, pulse 59, temperature 97.8 F (36.6 C), temperature source Oral, resp. rate 17, weight 47.537 kg (104 lb 12.8 oz), SpO2 94 %. No results found. No results for input(s): WBC, HGB, HCT, PLT in the last 72 hours.  Recent Labs  07/11/14 0620  CREATININE 0.99   CBG (last 3)  No results for input(s): GLUCAP in the last 72 hours.  Wt Readings from Last 3 Encounters:  07/10/14 47.537 kg (104 lb 12.8 oz)  07/01/14 52.617 kg (116 lb)  05/17/14 49.896 kg (110 lb)    Physical Exam:  Constitutional: She is oriented to person, place, and time. She appears well-developed and well-nourished.  HENT:  Head: Normocephalic and atraumatic.  Eyes: Conjunctivae are normal. Pupils are equal, round, and reactive to light.  Neck: Normal range of motion. Neck supple.  Cardiovascular: Normal rate.  Respiratory: Effort normal and breath sounds normal. No respiratory distress. She has no wheezes.  GI: Soft. Bowel sounds are normal. She exhibits no distension. There is no tenderness.  Musculoskeletal: She exhibits no edema or tenderness.  Neurological: She is alert and oriented to person, place, and time. Reasonable insight and awareness Able to follow 1 and 2 step commands without difficulty. Speech generally clear. Mild left facial weakness. Left hemiparesis with ataxia and sensory deficits. LUE 4/5 prox to distal. LLE: 2/5 hf, 3/5 ke and 3+ to 4 adf/apf. Sensory 1+/2 left face, 1+/2 fingers/left hand, 1+/2 prox left leg 1/2 distal leg and foot  Skin: Skin is warm and dry.  Psych: pleasant, cooperative  Assessment/Plan: 1. Functional deficits secondary to right thalamic infarction which require 3+ hours per day of  interdisciplinary therapy in a comprehensive inpatient rehab setting. Physiatrist is providing close team supervision and 24 hour management of active medical problems listed below. Physiatrist and rehab team continue to assess barriers to discharge/monitor patient progress toward functional and medical goals.  FIM: FIM - Bathing Bathing Steps Patient Completed: Chest, Right Arm, Left Arm, Abdomen, Front perineal area, Right upper leg, Left upper leg, Right lower leg (including foot), Left lower leg (including foot), Buttocks Bathing: 4: Steadying assist  FIM - Upper Body Dressing/Undressing Upper body dressing/undressing steps patient completed: Thread/unthread right bra strap, Thread/unthread left bra strap, Thread/unthread right sleeve of pullover shirt/dresss, Thread/unthread left sleeve of pullover shirt/dress, Put head through opening of pull over shirt/dress, Pull shirt over trunk, Hook/unhook bra Upper body dressing/undressing: 5: Set-up assist to: Obtain clothing/put away FIM - Lower Body Dressing/Undressing Lower body dressing/undressing steps patient completed: Thread/unthread right underwear leg, Thread/unthread left underwear leg, Thread/unthread right pants leg, Thread/unthread left pants leg, Don/Doff right sock, Don/Doff left sock, Don/Doff right shoe, Fasten/unfasten right shoe, Pull underwear up/down, Pull pants up/down, Fasten/unfasten left shoe, Don/Doff left shoe Lower body dressing/undressing: 4: Steadying Assist  FIM - Toileting Toileting steps completed by patient: Performs perineal hygiene, Adjust clothing after toileting Toileting Assistive Devices: Grab bar or rail for support Toileting: 3: Mod-Patient completed 2 of 3 steps  FIM - Diplomatic Services operational officer Devices: Grab bars Toilet Transfers: 4-To toilet/BSC: Min A (steadying Pt. > 75%), 4-From toilet/BSC: Min A (steadying Pt. > 75%)  FIM - Bed/Chair Transfer Bed/Chair Transfer Assistive  Devices: Arm rests, Manufacturing systems engineer Transfer: 4: Bed > Chair or W/C:  Min A (steadying Pt. > 75%), 4: Chair or W/C > Bed: Min A (steadying Pt. > 75%)  FIM - Locomotion: Wheelchair Distance: 100 Locomotion: Wheelchair: 0: Activity did not occur FIM - Locomotion: Ambulation Locomotion: Ambulation Assistive Devices: Orthosis, Other (comment), Walker - Rolling (L Foot Up brace) Ambulation/Gait Assistance: 3: Mod assist, 4: Min assist Locomotion: Ambulation: 2: Travels 50 - 149 ft with moderate assistance (Pt: 50 - 74%)  Comprehension Comprehension Mode: Auditory Comprehension: 5-Follows basic conversation/direction: With extra time/assistive device  Expression Expression Mode: Verbal Expression: 5-Expresses basic 90% of the time/requires cueing < 10% of the time.  Social Interaction Social Interaction: 6-Interacts appropriately with others with medication or extra time (anti-anxiety, antidepressant).  Problem Solving Problem Solving: 5-Solves basic 90% of the time/requires cueing < 10% of the time  Memory Memory: 5-Recognizes or recalls 90% of the time/requires cueing < 10% of the time  Medical Problem List and Plan: 1. Functional deficits secondary to Right thalamic infarction with profound left hemisensory deficits (esp left leg) and left hemiparesis 2. DVT Prophylaxis/Anticoagulation: Pharmaceutical: Lovenox 3. Left hip pain/Pain Management: Prn Vicodin  4. Mood: LCSW to follow for evaluation and support.  5. Neuropsych: This patient is capable of making decisions on her own behalf. 6. Skin/Wound Care: routine pressure relief measures.  7. Fluids/Electrolytes/Nutrition: Monitor I/O. Offer supplements between meals. BMET normal today    8.CV- last EP and cardiology consults 3/30 and 07/04/14 did not rec PPM 9. HTN: Monitor BP every 8 hours. Blood pressures trending up and lisinopril resumed today.  10. CKD: Baseline Cr-1.31. (0.88 today) LOS (Days) 8 A FACE TO FACE  EVALUATION WAS PERFORMED  KIRSTEINS,ANDREW E 07/12/2014 9:10 AM

## 2014-07-12 NOTE — Progress Notes (Signed)
Occupational Therapy Session Note  Patient Details  Name: Horton FinerOphelia W Huie MRN: [id number] Date of Birth: 1927/02/16  Today's Date: 07/12/2014 OT Individual Time: 1400-1500 OT Individual Time Calculation (min): 60 min    Short Term Goals: Week 1:  OT Short Term Goal 1 (Week 1): STG= LTGs secondary to short estimated LOS  Skilled Therapeutic Interventions/Progress Updates:    Skilled OT session focusing on activity tolerance and dynamic standing balance. Therapist facilitated amubulation from room to day room using RW with one rest break midway. Therapist provided min/mod A for RW management and maintaining safe position within RW Noted difficulty maintaining motor control of LLE during 90 degree turns. Reported weakness and feeling like "that leg just doesn't listen." Therapist pushed w/c to therapy gym and engaged pt in Wii bowling activity in standing with lt knee block to prevent hyperextension, manual facilitation of hips for equal WB on LEs, and RW positioned to pts lt for balance support. Pt educated on side step in RW through therapist demonstration and being provided with cues to improve sequencing for safety and control. Pt ambulated using RW back to room with no rest breaks but with min steady A for balance.  Therapy Documentation Precautions:  Precautions Precautions: Fall Precaution Comments: L inattention, L genu recurvatum with standing/gait Restrictions Weight Bearing Restrictions: No   Pain: No c/o pain    See FIM for current functional status  Therapy/Group: Individual Therapy  Kindal Ponti 07/12/2014, 3:12 PM

## 2014-07-12 NOTE — Progress Notes (Signed)
Occupational Therapy Weekly Progress Note  Patient Details  Name: Tracy [name] MRN: [id number] Date of Birth: 07-10-1926  Beginning of progress report period: July 05, 2014 End of progress report period: July 12, 2014   Short term goals not set due to estimated length of stay.  Pt showing steady progress towards goals. Has progressed to min A in standing during ADLs and RW for functional mobility. Safety awareness has improved as pt is no longer impulsively standing without regard to safe set up. Dynamic standing balance has improved as pt no longer exhibits significant lt lean, but LLE remains as an area of focus for NMR and strengthening. Deficit in attention to lt environment and body is present during functional mobility, as pt tends to bump into items on lt and demonstrates difficulty with turns.  Patient continues to demonstrate the following deficits: Lt inattention, LLE weakness, decreased safety awareness and impulsivity and therefore will continue to benefit from skilled OT intervention to enhance overall performance with BADL, iADL and Reduce care partner burden.  See Patient's Care Plan for progression toward long term goals.  Patient progressing toward long term goals..  Continue plan of care.  Skilled Therapeutic Interventions/Progress Updates:    Therapy Documentation Precautions:  Precautions Precautions: Fall Precaution Comments: L inattention, L genu recurvatum with standing/gait Restrictions Weight Bearing Restrictions: No   Pain: Pain Assessment Pain Assessment: No/denies pain  See FIM for current functional status  Therapy/Group: Individual Therapy  Heidi Bishop 07/12/2014, 12:26 PM

## 2014-07-12 NOTE — Progress Notes (Signed)
Occupational Therapy Session Note  Patient Details  Name: Tracy [name] MRN: [id number] Date of Birth: 05-Jan-1927  Today's Date: 07/12/2014 OT Individual Time: 5284-13240730-0830 OT Individual Time Calculation (min): 60 min    Short Term Goals: Week 1:  OT Short Term Goal 1 (Week 1): STG= LTGs secondary to short estimated LOS  Skilled Therapeutic Interventions/Progress Updates:    Pt received in room in w/c. C/o no pain. Performed UB bathing/dressing/grooming w/c level at sink. Performed LB bathing/dressing in w/c at sink req min A during sit>stand due to LLE weakness. Noted improvement in safety awareness during sit>stand as pt initiated removal of w/c foot pedals prior to stand. Pt demonstrated mild L inattention as evidenced by requiring one cue to wash LLE and don lt sock before initiating next task in self-care sequence. Pt educated on shoe/sock donning/fastening technique bending knee and bringing foot up to torso level to reduce forward lean and risk of fall from front. Therapist facilitated teach back method to maximize carryover of education provided. Engaged in therapeutic activity challenging lt inattention by reading headlines on lt page of newspaper. Pt showed no deficit in visual attention during reading activity.  Therapy Documentation Precautions:  Precautions Precautions: Fall Precaution Comments: L inattention, L genu recurvatum with standing/gait Restrictions Weight Bearing Restrictions: No General:   Vital Signs:  Pain: Pain Assessment Pain Assessment: No/denies pain Pain Score: 0-No pain ADL:   Exercises:   Other Treatments:    See FIM for current functional status  Therapy/Group: Individual Therapy  Alleen Kehm 07/12/2014, 11:28 AM

## 2014-07-13 ENCOUNTER — Inpatient Hospital Stay (HOSPITAL_COMMUNITY): Payer: Medicare Other | Admitting: Physical Therapy

## 2014-07-13 NOTE — Progress Notes (Signed)
Physical Therapy Weekly Progress Note  Patient Details  Name: Tracy [name] MRN: [id number] Date of Birth: 1927-04-04  Beginning of progress report period: July 05, 2014 End of progress report period: July 13, 2014  Today's Date: 07/13/2014 PT Individual Time: 1500-1530 PT Individual Time Calculation (min): 30 min   Patient has met 1 of 11 long term goals.  Short term goals not set due to estimated length of stay.  Patient continues to demonstrate the following deficits: abnormal posture, abnormality of gait, hemiparesis non-dominant, impaired sensation and muscle weakness and therefore will continue to benefit from skilled PT intervention to enhance overall performance with activity tolerance, balance, postural control, ability to compensate for deficits, functional use of  left lower extremity, attention, awareness and coordination.  See Patient's Care Plan for progression toward long term goals.  Patient progressing toward long term goals..  Plan of care revisions: Community ambulation and stair goals have been downgraded from supervision to min A due to pt L UE/LE inattention..  Skilled Therapeutic Interventions/Progress Updates:  Pt received in w/c in room with son present; agreeable to therapy.  Session focused on initiating family training with son, who will be providing 24 supervision/assistance for the pt at discharge.  Pt propelled w/c with B UEs 75' towards gym with min A and min cues to attend to L environment; pt transported total A the remaining distance to the gym for energy efficiency.  Explained safe technique for car transfer with son, including proper guarding and hand placement (emphasizing decreased hold on L UE); son demonstrated car transfer with pt min A with cues provided from SPT for safety.  Pt functionally ambulated x100' then x50' with son demonstrating proper guarding techniques and providing cues for safety and gait speed.  Pt propelled w/c with B UEs the remaining  distance to room (86') with supervision and was left in w/c with son present and all needs within reach.    Therapy Documentation Precautions:  Precautions Precautions: Fall Precaution Comments: L inattention, L genu recurvatum with standing/gait Restrictions Weight Bearing Restrictions: No Vital Signs: Therapy Vitals Temp: 97.9 F (36.6 C) Temp Source: Oral Pulse Rate: (!) 59 Resp: 16 BP: 130/66 mmHg Patient Position (if appropriate): Sitting Oxygen Therapy SpO2: 97 % Pain: Pain Assessment Pain Assessment: No/denies pain Locomotion : Ambulation Ambulation/Gait Assistance: 4: Min assist Wheelchair Mobility Distance: 100   See FIM for current functional status  Therapy/Group: Individual Therapy  Khaleesi Gruel 07/13/2014, 3:48 PM

## 2014-07-13 NOTE — Plan of Care (Signed)
Problem: RH Ambulation Goal: LTG Patient will ambulate in community environment (PT) LTG: Patient will ambulate in community environment, # of feet with assistance (PT).  Downgraded from supervision to min A due to pt L UE/LE inattention.

## 2014-07-13 NOTE — Plan of Care (Signed)
Problem: RH Stairs Goal: LTG Patient will ambulate up and down stairs w/assist (PT) LTG: Patient will ambulate up and down # of stairs with assistance (PT)  Downgraded from supervision to min A due to pt L UE/LE inattention.

## 2014-07-13 NOTE — Progress Notes (Addendum)
PHYSICAL MEDICINE & REHABILITATION     PROGRESS NOTE    Subjective/Complaints: No new problems ,slept well Review of Systems - Negative except LLE weakness in addition to above  Objective: Vital Signs: Blood pressure 157/75, pulse 60, temperature 98.3 F (36.8 C), temperature source Oral, resp. rate 20, weight 47.537 kg (104 lb 12.8 oz), SpO2 98 %. No results found. No results for input(s): WBC, HGB, HCT, PLT in the last 72 hours.  Recent Labs  07/11/14 0620  CREATININE 0.99   CBG (last 3)  No results for input(s): GLUCAP in the last 72 hours.  Wt Readings from Last 3 Encounters:  07/10/14 47.537 kg (104 lb 12.8 oz)  07/01/14 52.617 kg (116 lb)  05/17/14 49.896 kg (110 lb)    Physical Exam:  Constitutional: She is oriented to person, place, and time. She appears well-developed and well-nourished.  HENT:  Head: Normocephalic and atraumatic.  Eyes: Conjunctivae are normal. Pupils are equal, round, and reactive to light.  Neck: Normal range of motion. Neck supple.  Cardiovascular: Normal rate.  Respiratory: Effort normal and breath sounds normal. No respiratory distress. She has no wheezes.  GI: Soft. Bowel sounds are normal. She exhibits no distension. There is no tenderness.  Musculoskeletal: She exhibits no edema or tenderness.  Neurological: She is alert and oriented to person, place, and time.   Able to follow 1 and 2 step commands without difficulty. Speech generally clear. Mild left facial weakness. Left hemiparesis with ataxia and sensory deficits. LUE 4/5 prox to distal. LLE: 2/5 hf, 3/5 ke and 3+ to 4 adf/apf. Sensory 1+/2 left face, 1+/2 fingers/left hand, 1+/2 prox left leg 1/2 distal leg and foot  Skin: Skin is warm and dry.  Psych: pleasant, cooperative  Assessment/Plan: 1. Functional deficits secondary to right thalamic infarction which require 3+ hours per day of interdisciplinary therapy in a comprehensive inpatient rehab  setting. Physiatrist is providing close team supervision and 24 hour management of active medical problems listed below. Physiatrist and rehab team continue to assess barriers to discharge/monitor patient progress toward functional and medical goals.  FIM: FIM - Bathing Bathing Steps Patient Completed: Chest, Right Arm, Left Arm, Abdomen, Front perineal area, Right upper leg, Left upper leg, Right lower leg (including foot), Left lower leg (including foot), Buttocks Bathing: 4: Steadying assist  FIM - Upper Body Dressing/Undressing Upper body dressing/undressing steps patient completed: Thread/unthread right bra strap, Thread/unthread left bra strap, Thread/unthread right sleeve of pullover shirt/dresss, Thread/unthread left sleeve of pullover shirt/dress, Put head through opening of pull over shirt/dress, Pull shirt over trunk, Hook/unhook bra Upper body dressing/undressing: 5: Set-up assist to: Obtain clothing/put away FIM - Lower Body Dressing/Undressing Lower body dressing/undressing steps patient completed: Thread/unthread right underwear leg, Thread/unthread left underwear leg, Thread/unthread right pants leg, Thread/unthread left pants leg, Don/Doff right sock, Don/Doff left sock, Don/Doff right shoe, Fasten/unfasten right shoe, Pull underwear up/down, Pull pants up/down, Fasten/unfasten left shoe, Don/Doff left shoe Lower body dressing/undressing: 4: Steadying Assist  FIM - Toileting Toileting steps completed by patient: Performs perineal hygiene Toileting Assistive Devices: Grab bar or rail for support Toileting: 2: Max-Patient completed 1 of 3 steps  FIM - Diplomatic Services operational officer Devices: Psychiatrist Transfers: 4-To toilet/BSC: Min A (steadying Pt. > 75%)  FIM - Banker Devices: Arm rests, Therapist, occupational: 3: Bed > Chair or W/C: Mod A (lift or lower assist), 3: Chair or W/C > Bed: Mod A (lift or  lower  assist)  FIM - Locomotion: Wheelchair Distance: 100 Locomotion: Wheelchair: 0: Activity did not occur (Pt ambulatory on unit) FIM - Locomotion: Ambulation Locomotion: Ambulation Assistive Devices: Designer, industrial/productWalker - Rolling Ambulation/Gait Assistance: 4: Min assist Locomotion: Ambulation: 2: Travels 50 - 149 ft with minimal assistance (Pt.>75%)  Comprehension Comprehension Mode: Auditory Comprehension: 5-Follows basic conversation/direction: With no assist  Expression Expression Mode: Verbal Expression: 5-Expresses basic 90% of the time/requires cueing < 10% of the time.  Social Interaction Social Interaction: 6-Interacts appropriately with others with medication or extra time (anti-anxiety, antidepressant).  Problem Solving Problem Solving: 5-Solves complex 90% of the time/cues < 10% of the time  Memory Memory: 5-Recognizes or recalls 90% of the time/requires cueing < 10% of the time  Medical Problem List and Plan: 1. Functional deficits secondary to Right thalamic infarction with profound left hemisensory deficits (esp left leg) and left hemiparesis 2. DVT Prophylaxis/Anticoagulation: Pharmaceutical: Lovenox 3. Left hip pain/Pain Management: Prn Vicodin  4. Mood: LCSW to follow for evaluation and support.  5. Neuropsych: This patient is capable of making decisions on her own behalf. 6. Skin/Wound Care: routine pressure relief measures.  7. Fluids/Electrolytes/Nutrition: Monitor I/O. Offer supplements between meals. BMET normal today    8.CV- last EP and cardiology consults 3/30 and 07/04/14 did not rec PPM 9. HTN: Monitor BP every 8 hours. Blood pressures trending up and lisinopril resumed today.  10. CKD: Baseline Cr-1.31. Stable/improved LOS (Days) 9 A FACE TO FACE EVALUATION WAS PERFORMED  Erick ColaceKIRSTEINS,ANDREW E 07/13/2014 10:28 AM

## 2014-07-14 ENCOUNTER — Inpatient Hospital Stay (HOSPITAL_COMMUNITY): Payer: Self-pay

## 2014-07-14 NOTE — Progress Notes (Signed)
[place] PHYSICAL MEDICINE & REHABILITATION     PROGRESS NOTE    Subjective/Complaints: Patient is asking about discharge date hoping to go Tuesday rather than Thursday. No new subjective complaints. Review of Systems - Negative except LLE weakness in addition to above  Objective: Vital Signs: Blood pressure 117/65, pulse 57, temperature 98.2 F (36.8 C), temperature source Oral, resp. rate 17, weight 47.537 kg (104 lb 12.8 oz), SpO2 99 %. No results found. No results for input(s): WBC, HGB, HCT, PLT in the last 72 hours. No results for input(s): NA, K, CL, GLUCOSE, BUN, CREATININE, CALCIUM in the last 72 hours.  Invalid input(s): CO CBG (last 3)  No results for input(s): GLUCAP in the last 72 hours.  Wt Readings from Last 3 Encounters:  07/10/14 47.537 kg (104 lb 12.8 oz)  07/01/14 52.617 kg (116 lb)  05/17/14 49.896 kg (110 lb)    Physical Exam:  Constitutional: She is oriented to person, place, and time. She appears well-developed and well-nourished.  HENT:  Head: Normocephalic and atraumatic.  Eyes: Conjunctivae are normal. Pupils are equal, round, and reactive to light.  Neck: Normal range of motion. Neck supple.  Cardiovascular: Normal rate.  Respiratory: Effort normal and breath sounds normal. No respiratory distress. She has no wheezes.  GI: Soft. Bowel sounds are normal. She exhibits no distension. There is no tenderness.  Musculoskeletal: She exhibits no edema or tenderness.  Neurological: She is alert and oriented to person, place, and time.   Able to follow 1 and 2 step commands without difficulty. Speech generally clear. Mild left facial weakness. Left hemiparesis with ataxia and sensory deficits. LUE 4/5 prox to distal. LLE: 2/5 hf, 3/5 ke and 3+adf/apf. Sensory 1+/2 left face, 1+/2 fingers/left hand, 1+/2 prox left leg 1/2 distal leg and foot  Skin: Skin is warm and dry.  Psych: pleasant, cooperative  Assessment/Plan: 1. Functional deficits  secondary to right thalamic infarction which require 3+ hours per day of interdisciplinary therapy in a comprehensive inpatient rehab setting. Physiatrist is providing close team supervision and 24 hour management of active medical problems listed below. Physiatrist and rehab team continue to assess barriers to discharge/monitor patient progress toward functional and medical goals.  FIM: FIM - Bathing Bathing Steps Patient Completed: Chest, Right Arm, Left Arm, Abdomen, Front perineal area, Buttocks, Right upper leg, Left upper leg, Right lower leg (including foot), Left lower leg (including foot) Bathing: 4: Steadying assist  FIM - Upper Body Dressing/Undressing Upper body dressing/undressing steps patient completed: Thread/unthread right bra strap, Thread/unthread left bra strap, Hook/unhook bra, Thread/unthread right sleeve of pullover shirt/dresss, Thread/unthread left sleeve of pullover shirt/dress, Put head through opening of pull over shirt/dress, Pull shirt over trunk Upper body dressing/undressing: 5: Set-up assist to: Obtain clothing/put away FIM - Lower Body Dressing/Undressing Lower body dressing/undressing steps patient completed: Thread/unthread right underwear leg, Thread/unthread left underwear leg, Pull underwear up/down, Thread/unthread right pants leg, Thread/unthread left pants leg, Pull pants up/down, Don/Doff right sock, Don/Doff left sock, Don/Doff right shoe, Don/Doff left shoe Lower body dressing/undressing: 6: More than reasonable amount of time  FIM - Toileting Toileting steps completed by patient: Adjust clothing prior to toileting, Performs perineal hygiene Toileting Assistive Devices: Grab bar or rail for support Toileting: 3: Mod-Patient completed 2 of 3 steps  FIM - Diplomatic Services operational officer Devices: Grab bars Toilet Transfers: 4-To toilet/BSC: Min A (steadying Pt. > 75%), 4-From toilet/BSC: Min A (steadying Pt. > 75%)  FIM - Physiological scientist  Devices: Arm rests, Therapist, occupationalWalker Bed/Chair Transfer: 4: Bed > Chair or W/C: Min A (steadying Pt. > 75%), 4: Chair or W/C > Bed: Min A (steadying Pt. > 75%)  FIM - Locomotion: Wheelchair Distance: 100 Locomotion: Wheelchair: 2: Travels 50 - 149 ft with minimal assistance (Pt.>75%) FIM - Locomotion: Ambulation Locomotion: Ambulation Assistive Devices: Designer, industrial/productWalker - Rolling Ambulation/Gait Assistance: 4: Min assist Locomotion: Ambulation: 2: Travels 50 - 149 ft with minimal assistance (Pt.>75%)  Comprehension Comprehension Mode: Auditory Comprehension: 5-Follows basic conversation/direction: With no assist  Expression Expression Mode: Verbal Expression: 5-Expresses basic 90% of the time/requires cueing < 10% of the time.  Social Interaction Social Interaction: 6-Interacts appropriately with others with medication or extra time (anti-anxiety, antidepressant).  Problem Solving Problem Solving: 5-Solves complex 90% of the time/cues < 10% of the time  Memory Memory: 5-Recognizes or recalls 90% of the time/requires cueing < 10% of the time  Medical Problem List and Plan: 1. Functional deficits secondary to Right thalamic infarction with profound left hemisensory deficits (esp left leg) and left hemiparesis, lower extremity greater than upper extremity 2. DVT Prophylaxis/Anticoagulation: Pharmaceutical: Lovenox 3. Left hip pain/Pain Management: Prn Vicodin  4. Mood: LCSW to follow for evaluation and support.  5. Neuropsych: This patient is capable of making decisions on her own behalf. 6. Skin/Wound Care: routine pressure relief measures.  7. Fluids/Electrolytes/Nutrition: Monitor I/O.  8.CV- last EP and cardiology consults 3/30 and 07/04/14 did not rec PPM 9. HTN: Monitor BP every 8 hours. Blood pressures trending up and lisinopril resumed  10. CKD: Baseline Cr-1.31. Stable/improved LOS (Days) 10 A FACE TO FACE EVALUATION WAS  PERFORMED  Claudette LawsKIRSTEINS,Dandrae Kustra E 07/14/2014 10:40 AM

## 2014-07-14 NOTE — Progress Notes (Signed)
Physical Therapy Session Note  Patient Details  Name: Tracy FinerOphelia W [name] MRN: [id number] Date of Birth: 06-29-1926  Today's Date: 07/14/2014 PT Individual Time: 1430-1515 PT Individual Time Calculation (min): 45 min   Short Term Goals: Week 1:  PT Short Term Goal 1 (Week 1): STG's = LTG's secondary to anticipated LOS.  Skilled Therapeutic Interventions/Progress Updates:    Pt received supine in bed, agreeable to participate in therapy. Session focused on dynamic standing balance, w/c mobility, UE/LE strengthening, gait. Pt transferred bed>w/c w/ squat pivot to R w/ MinA. Propelled w/c to rehab gym w/ SBA. Pt completed activity of finding horseshoes around gym to address dynamic standing balance and attention to L, pt with intermittent LOB when turning with RW requiring Min-ModA to correct but otherwise MinA for gait. Pt tossed horseshoes at target in standing with graded R HHA support from therapist. Instructed pt in Nustep 5' w/ UE./LE and 5' w/ LE only L5 for general strengthening and cardiovascular endurance while engaged in therapeutic conversation with therapist on L side to address inattention. Session ended in pt's room, where pt was left seated in w/c w/ family present w/ all needs within reach.    Therapy Documentation Precautions:  Precautions Precautions: Fall Precaution Comments: L inattention, L genu recurvatum with standing/gait Restrictions Weight Bearing Restrictions: No Pain:  No/denies pain  See FIM for current functional status  Therapy/Group: Individual Therapy  Hosie SpangleGodfrey, Novalynn Branaman  Hosie SpangleJess Farron Watrous, PT, DPT 07/14/2014, 7:57 AM

## 2014-07-15 ENCOUNTER — Inpatient Hospital Stay (HOSPITAL_COMMUNITY): Payer: Medicare Other | Admitting: Occupational Therapy

## 2014-07-15 ENCOUNTER — Inpatient Hospital Stay (HOSPITAL_COMMUNITY): Payer: Medicare Other | Admitting: Physical Therapy

## 2014-07-15 DIAGNOSIS — IMO0002 Reserved for concepts with insufficient information to code with codable children: Secondary | ICD-10-CM

## 2014-07-15 NOTE — Progress Notes (Signed)
Physical Therapy Session Note  Patient Details  Name: Tracy [name] MRN: [id number] Date of Birth: 1926/05/03  Today's Date: 07/15/2014 PT Individual Time: 1400-1530 PT Individual Time Calculation (min): 90 min   Short Term Goals: Week 1:  PT Short Term Goal 1 (Week 1): STG's = LTG's secondary to anticipated LOS.  Skilled Therapeutic Interventions/Progress Updates:  Pt received in w/c in room; agreeable to therapy.  Session focused on functional ambulation in controlled, home, and community environments, furniture transfers, bed mobility, and functional standing balance.  Pt propelled w/c with B UEs x50' then later x315' with supervision in a controlled and home environments requiring min verbal cueing for attention to L UE and L environment.  Pt performed multiple ambulation trials x50' each in controlled, home, and community environment with rolling walker and min guard/min A.  Pt performed blocked practice of furniture transfer on couch with rolling walker and min A. Pt completed 12 stairs B rails min A with verbal cues for sequencing.  Pt performed 12 mins of NU step on level 5, x6 mins with B UEs, x6 mins with L UE only for increased attention to L UE/LE, requiring a rest break at 6 mins. Pt ambulated with Min A, L HHA 100' in a controlled environment.  Pt completed Berg Balance Scale (BBS), improving her score from 11/56 initially to 22/56 (see scoring details below).  Pt educated on BBS score, progress, and current falls risk; pt verbalized understanding, stating that the pt wouldn't try to walk without help. Pt left in w/c with family present and all needs within reach.      Therapy Documentation Precautions:  Precautions Precautions: Fall Precaution Comments: L inattention, L genu recurvatum with standing/gait Restrictions Weight Bearing Restrictions: No Pain: Pain Assessment Pain Assessment:  (Entry made in error) Locomotion : Ambulation Ambulation/Gait Assistance: 4: Min  assist Wheelchair Mobility Distance: 315  Balance: Balance Balance Assessed: Yes Standardized Balance Assessment Standardized Balance Assessment: Berg Balance Test Berg Balance Test Sit to Stand: Able to stand  independently using hands Standing Unsupported: Able to stand 2 minutes with supervision Sitting with Back Unsupported but Feet Supported on Floor or Stool: Able to sit safely and securely 2 minutes Stand to Sit: Controls descent by using hands Transfers: Able to transfer with verbal cueing and /or supervision Standing Unsupported with Eyes Closed: Able to stand 10 seconds with supervision Standing Ubsupported with Feet Together: Needs help to attain position and unable to hold for 15 seconds From Standing, Reach Forward with Outstretched Arm: Reaches forward but needs supervision From Standing Position, Pick up Object from Floor: Able to pick up shoe, needs supervision From Standing Position, Turn to Look Behind Over each Shoulder: Needs assist to keep from losing balance and falling Turn 360 Degrees: Needs assistance while turning Standing Unsupported, Alternately Place Feet on Step/Stool: Needs assistance to keep from falling or unable to try Standing Unsupported, One Foot in Front: Loses balance while stepping or standing Standing on One Leg: Unable to try or needs assist to prevent fall Total Score: 22  See FIM for current functional status  Therapy/Group: Individual Therapy  Jeral Zick 07/15/2014, 4:11 PM

## 2014-07-15 NOTE — Progress Notes (Signed)
Occupational Therapy Session Note  Patient Details  Name: Tracy [name] MRN: [id number] Date of Birth: [birth year]  Today's Date: 07/15/2014 OT Individual Time: 0930-1030 OT Individual Time Calculation (min): 60 min    Short Term Goals: Week 2:  OT Short Term Goal 1 (Week 2): STG = LTGs due to remaining LOS  Skilled Therapeutic Interventions/Progress Updates:   Skilled OT session focusing on ADL retraining and functional mobility using RW. Pt completed UB bathing/dressing/grooming w/c level at sink. Performed LB bathing/dressing in w/c at sink req min A during sit>stand due to LLE weakness. Noted improvement in safety awareness and decrease in lt lean. Therapist provided education on importance of evaluating any item in environment which could be used as a balance assist (i.e. avoid rolling or wet items). In day room, therapist facilitated functional mobility in a community setting by arranging furniture in a way that pt was required to navigate around items and use RW techniques such as side step. Pt req mod verbal/ tactile cues to stay inside walker especially during turns. Pt req manual facilitation at hips to maintain position in walker during side step and benefited from precise verbal/tactile cues to sustain steps involved in moving RW then feet during side step. Noted dec motor control in LLE.   Therapy Documentation Precautions:  Precautions Precautions: Fall Precaution Comments: L inattention, L genu recurvatum with standing/gait Restrictions Weight Bearing Restrictions: No   Pain: Pt reported overnight  muscle pain in lt bicep due to challenging therapy. Addressed with nurse overnight. Tylenol provided.             See FIM for current functional status  Therapy/Group: Individual Therapy  Zanetta Dehaan 07/15/2014, 10:42 AM

## 2014-07-15 NOTE — Progress Notes (Signed)
Occupational Therapy Session Note  Patient Details  Name: Horton FinerOphelia W Kops MRN: [id number] Date of Birth: [birth year]  Today's Date: 07/15/2014 OT Individual Time: 1300-1400 OT Individual Time Calculation (min): 60 min    Short Term Goals: Week 2:  OT Short Term Goal 1 (Week 2): STG = LTGs due to remaining LOS  Skilled Therapeutic Interventions/Progress Updates:    Skilled OT session focusing on  Functional mobility, toileting/toilet transfers and determination of appropriate DME for d/c. Therapist facilitated discussion on plans for managing toileting/toilet transfers at home. Pt expressed willingness to allow adult son who lives with her to accompany her to the toilet and away from the toilet with firm desire to pull pants down/up and perform toileting without a female in the room. Pt performed toilet transfers with min A in therapy apartment and therapy gym onto 3:1 with similar to model likely to be issued and simulated pulling pants up/down using theraband around waist. Pt educated on one-handed technique of using one hand to steady self on RW and other to pull pants up/down then switching for optimum stability and safety. Pt req mod verbal cues to recall technique of using w/c/ 3:1 hand rail during stand<>sit. Pts daughter present for a portion of session.  Therapy Documentation Precautions:  Precautions Precautions: Fall Precaution Comments: L inattention, L genu recurvatum with standing/gait Restrictions Weight Bearing Restrictions: No  Pain: No c/o pain    See FIM for current functional status  Therapy/Group: Individual Therapy  Kendrell Lottman 07/15/2014, 3:27 PM

## 2014-07-15 NOTE — Progress Notes (Signed)
Orthopedic Tech Progress Note Patient Details:  Tracy [name] Thresher 01/24/1927 [id number] [facility] [place]. Patient ID: Tracy [name] Frentz, female   DOB: 01/24/1927, 81 y.o.   MRN: [id number]   Jennye MoccasinHughes, Adryana Mogensen Craig 07/15/2014, 2:55 PM

## 2014-07-15 NOTE — Plan of Care (Signed)
Problem: RH Toileting Goal: LTG Patient will perform toileting w/assist, cues/equip (OT) LTG: Patient will perform toiletiing (clothes management/hygiene) with assist, with/without cues using equipment (OT)  Upgraded due to progress and pt request secondary to not wanting physical assistance from son with toileting  Problem: RH Toilet Transfers Goal: LTG Patient will perform toilet transfers w/assist (OT) LTG: Patient will perform toilet transfers with assist, with/without cues using equipment (OT)  Downgraded due to slow progress and will most likely need min/steady assist with transfer with RW.

## 2014-07-15 NOTE — Progress Notes (Signed)
Social Work Patient ID: Tracy [name], female   DOB: [birth year], 81 y.o.   MRN: [id number] Met with pt and son to discuss equipment needs.  Informed walker and wheelchair are both not covered by medicare, but will see what the therapists recommend For home.  Pt likes to take bathes and has no plans to use a seat, she will sponge bathe until she is able to get back into the bath tub on her own. Son here to observe in  Therapies and pleased with how well she has progressed here.

## 2014-07-15 NOTE — Progress Notes (Signed)
PRN tylenol given at 2324 for complaint of LUE pain. Rested without complaint of.Alfredo MartinezMurray, Morrie Daywalt A

## 2014-07-15 NOTE — Progress Notes (Signed)
Recreational Therapy Discharge Summary Patient Details  Name: [name] MRN: [id number] Date of Birth: December 03, 1926 Today's Date: 07/15/2014  Long term goals set: 1  Long term goals met: 1  Comments on progress toward goals: Pt has made good progress toward goal and is scheduled for discharge home with kids to provide 24 hour supervision/assist.  Pt requires supervision/set up assist for simple task completion seated and up to mod assist for standing tasks with cuing for safety and attending to the left.  Reasons for discharge: discharge from hospital  Patient/family agrees with progress made and goals achieved: Yes  Quintavia Rogstad 07/15/2014, 9:42 AM

## 2014-07-15 NOTE — Progress Notes (Signed)
[facility] PHYSICAL MEDICINE & REHABILITATION     PROGRESS NOTE    Subjective/Complaints: Sone with questions regarding equipment Review of Systems - Negative except LLE weakness in addition to above  Objective: Vital Signs: Blood pressure 142/72, pulse 56, temperature 98 F (36.7 C), temperature source Oral, resp. rate 17, weight 47.537 kg (104 lb 12.8 oz), SpO2 98 %. No results found. No results for input(s): WBC, HGB, HCT, PLT in the last 72 hours. No results for input(s): NA, K, CL, GLUCOSE, BUN, CREATININE, CALCIUM in the last 72 hours.  Invalid input(s): CO CBG (last 3)  No results for input(s): GLUCAP in the last 72 hours.  Wt Readings from Last 3 Encounters:  07/10/14 47.537 kg (104 lb 12.8 oz)  07/01/14 52.617 kg (116 lb)  05/17/14 49.896 kg (110 lb)    Physical Exam:  Constitutional: She is oriented to person, place, and time. She appears well-developed and well-nourished.  HENT:  Head: Normocephalic and atraumatic.  Eyes: Conjunctivae are normal. Pupils are equal, round, and reactive to light.  Neck: Normal range of motion. Neck supple.  Cardiovascular: Normal rate.  Respiratory: Effort normal and breath sounds normal. No respiratory distress. She has no wheezes.  GI: Soft. Bowel sounds are normal. She exhibits no distension. There is no tenderness.  Musculoskeletal: She exhibits no edema or tenderness.  Neurological: She is alert and oriented to person, place, and time.   Able to follow 1 and 2 step commands without difficulty. Speech generally clear. Mild left facial weakness. Left hemiparesis with ataxia and sensory deficits. LUE 4/5 prox to distal. LLE: 2/5 hf, 3/5 ke and 3+adf/apf. Sensory 1+/2 left face, 1+/2 fingers/left hand, 1+/2 prox left leg 1/2 distal leg and foot  Skin: Skin is warm and dry.  Psych: pleasant, cooperative  Assessment/Plan: 1. Functional deficits secondary to right thalamic infarction which require 3+ hours per day of  interdisciplinary therapy in a comprehensive inpatient rehab setting. Physiatrist is providing close team supervision and 24 hour management of active medical problems listed below. Physiatrist and rehab team continue to assess barriers to discharge/monitor patient progress toward functional and medical goals. Discussed son's concerns with SW who will confer FIM: FIM - Bathing Bathing Steps Patient Completed: Chest, Right Arm, Left Arm, Abdomen, Front perineal area, Buttocks, Right upper leg, Left upper leg, Right lower leg (including foot), Left lower leg (including foot) Bathing: 4: Steadying assist  FIM - Upper Body Dressing/Undressing Upper body dressing/undressing steps patient completed: Thread/unthread right bra strap, Thread/unthread left bra strap, Hook/unhook bra, Thread/unthread right sleeve of pullover shirt/dresss, Thread/unthread left sleeve of pullover shirt/dress, Put head through opening of pull over shirt/dress, Pull shirt over trunk Upper body dressing/undressing: 5: Set-up assist to: Obtain clothing/put away FIM - Lower Body Dressing/Undressing Lower body dressing/undressing steps patient completed: Thread/unthread right underwear leg, Thread/unthread left underwear leg, Pull underwear up/down, Thread/unthread right pants leg, Thread/unthread left pants leg, Pull pants up/down, Don/Doff right sock, Don/Doff left sock, Don/Doff right shoe, Don/Doff left shoe Lower body dressing/undressing: 6: More than reasonable amount of time  FIM - Toileting Toileting steps completed by patient: Performs perineal hygiene Toileting Assistive Devices: Grab bar or rail for support Toileting: 2: Max-Patient completed 1 of 3 steps  FIM - Diplomatic Services operational officerToilet Transfers Toilet Transfers Assistive Devices: PsychiatristBedside commode Toilet Transfers: 4-To toilet/BSC: Min A (steadying Pt. > 75%)  FIM - BankerBed/Chair Transfer Bed/Chair Transfer Assistive Devices: Arm rests, Therapist, occupationalWalker Bed/Chair Transfer: 5: Supine > Sit:  Supervision (verbal cues/safety issues), 4: Bed > Chair  or W/C: Min A (steadying Pt. > 75%), 4: Chair or W/C > Bed: Min A (steadying Pt. > 75%)  FIM - Locomotion: Wheelchair Distance: 100 Locomotion: Wheelchair: 5: Travels 150 ft or more: maneuvers on rugs and over door sills with supervision, cueing or coaxing FIM - Locomotion: Ambulation Locomotion: Ambulation Assistive Devices: Designer, industrial/product Ambulation/Gait Assistance: 4: Min assist Locomotion: Ambulation: 2: Travels 50 - 149 ft with minimal assistance (Pt.>75%)  Comprehension Comprehension Mode: Auditory Comprehension: 5-Follows basic conversation/direction: With extra time/assistive device  Expression Expression Mode: Verbal Expression: 5-Expresses basic 90% of the time/requires cueing < 10% of the time.  Social Interaction Social Interaction: 6-Interacts appropriately with others with medication or extra time (anti-anxiety, antidepressant).  Problem Solving Problem Solving: 5-Solves basic 90% of the time/requires cueing < 10% of the time  Memory Memory: 5-Recognizes or recalls 90% of the time/requires cueing < 10% of the time  Medical Problem List and Plan: 1. Functional deficits secondary to Right thalamic infarction with profound left hemisensory deficits (esp left leg) and left hemiparesis, lower extremity greater than upper extremity 2. DVT Prophylaxis/Anticoagulation: Pharmaceutical: Lovenox 3. Left hip pain/Pain Management: Prn Vicodin  4. Mood: LCSW to follow for evaluation and support.  5. Neuropsych: This patient is capable of making decisions on her own behalf. 6. Skin/Wound Care: routine pressure relief measures.  7. Fluids/Electrolytes/Nutrition: Monitor I/O.  8.CV- last EP and cardiology consults 3/30 and 07/04/14 did not rec PPM 9. HTN: Monitor BP every 8 hours. Blood pressures trending up and lisinopril resumed  10. CKD: Baseline Cr-1.31. Stable/improved LOS (Days) 11 A FACE TO FACE EVALUATION WAS  PERFORMED  Erick Colace 07/15/2014 8:57 AM

## 2014-07-15 NOTE — Plan of Care (Signed)
Problem: RH Furniture Transfers Goal: LTG Patient will perform furniture transfers w/assist (OT/PT LTG: Patient will perform furniture transfers with assistance (OT/PT).  Downgraded due to min guard required.

## 2014-07-16 ENCOUNTER — Inpatient Hospital Stay (HOSPITAL_COMMUNITY): Payer: Medicare Other | Admitting: Occupational Therapy

## 2014-07-16 ENCOUNTER — Inpatient Hospital Stay (HOSPITAL_COMMUNITY): Payer: Self-pay | Admitting: Physical Therapy

## 2014-07-16 DIAGNOSIS — I69398 Other sequelae of cerebral infarction: Secondary | ICD-10-CM

## 2014-07-16 LAB — BASIC METABOLIC PANEL
ANION GAP: 9 (ref 5–15)
BUN: 20 mg/dL (ref 6–23)
CHLORIDE: 104 mmol/L (ref 96–112)
CO2: 28 mmol/L (ref 19–32)
Calcium: 9.7 mg/dL (ref 8.4–10.5)
Creatinine, Ser: 1.04 mg/dL (ref 0.50–1.10)
GFR calc non Af Amer: 48 mL/min — ABNORMAL LOW (ref >90)
GFR, EST AFRICAN AMERICAN: 55 mL/min — AB (ref >90)
Glucose, Bld: 120 mg/dL — ABNORMAL HIGH (ref 70–99)
POTASSIUM: 3.9 mmol/L (ref 3.5–5.1)
Sodium: 141 mmol/L (ref 135–145)

## 2014-07-16 MED ORDER — BENAZEPRIL HCL 20 MG PO TABS
20.0000 mg | ORAL_TABLET | Freq: Every day | ORAL | Status: DC
  Administered 2014-07-17 – 2014-07-18 (×2): 20 mg via ORAL
  Filled 2014-07-16 (×3): qty 1

## 2014-07-16 NOTE — Progress Notes (Signed)
Physical Therapy Session Note  Patient Details  Name: Tracy [name] MRN: [id number] Date of Birth: 11-Sep-1926  Today's Date: 07/16/2014 PT Individual Time: 1030-1200 PT Individual Time Calculation (min): 90 min   Short Term Goals: Week 1:  PT Short Term Goal 1 (Week 1): STG's = LTG's secondary to anticipated LOS.  Skilled Therapeutic Interventions/Progress Updates:    Pt received seated in w/c accompanied by son and daughter. Session focused on completion of hands-on family training with son, Lambert Ketolonzo. Orthotist, Thayer Ohmhris, present with L shoe, which was modified by addition of leather toe cap to prevent L toe drag during ambulation. Therapist explained and demonstrated the following: gait x150' in controlled and home environments with rolling walker and close supervision to min guard, verbal cueing for maintaining normal BOS and taking small steps during turns; simulated car transfer (sit <> stand) with rolling walker and supervision, cueing for safe positioning prior to sitting; negotiation of curb step with rolling walker requiring min A and manual stabilization of rolling walker, cueing for sequencing; floor transfer (seated on couch <> supine on mat on floor) with supervision, cueing for LUE/LLE joint protection during transitional movements); sit <> stand from elevated mat table (to simulate home bed) with rolling walker and min A; supine <> sit on elevated bed height with supervision, cueing for safe technique. Son gave effective return demonstration of safe technique for assistance, appropriate cueing with all described aspects of mobility. Pt and son express feeling safe/comfortable performing and assisting with functional mobility, respectively.  Unable to ambulate outdoors due to rainy weather during this session. Therefore, educated pt/son that pt will require hands-on assistance for outdoor ambulation secondary to ineffectiveness of leather toe cap over asphalt. Educated pt/son on  recommendations for follow up therapy, equipment, and 24/7 supervision required. Pt/son verbalized understanding and were in full agreement with recommendations. See below for detailed description of [place] balance exercise HEP. Session ended in pt room, where pt was left seated in w/c with family members present and all needs within reach.  Therapy Documentation Precautions:  Precautions Precautions: Fall Precaution Comments: L inattention, L genu recurvatum with standing/gait Restrictions Weight Bearing Restrictions: No Pain: Pain Assessment Pain Assessment: No/denies pain Locomotion : Ambulation Ambulation/Gait Assistance: 4: Min guard Stairs / Additional Locomotion Curb: 4: Min assist;Other (comment) (with rolling walker)  Therapeutic Exercises: Pt performed the following [place] exercises x10 reps per side with concurrent cueing for proper technique: - Seated: seated LAQ, active ankle plantarflexion (due to pt inability to perform L ankle plantarflexion in standing due to weakness, pain).  - Standing with bilat UE support at sturdy surface (kitchen countertop) requiring supervision for stability/nalance: body weight squats (cueing to promote hamstring activation), active ankle dorsiflexion, active hip abduction, active alternating knee flexion/extension. Son present and verbalized understanding of cueing/supervision required. Paper handout provided to promote carryover.  See FIM for current functional status  Therapy/Group: Individual Therapy  Hobble, Lorenda IshiharaBlair A 07/16/2014, 12:15 PM

## 2014-07-16 NOTE — Progress Notes (Signed)
Occupational Therapy Session Note  Patient Details  Name: Tracy FinerOphelia W [name] MRN: [id number] Date of Birth: [birth year]  Today's Date: 07/16/2014 OT Individual Time: 1300-1400 OT Individual Time Calculation (min): 60 min    Short Term Goals: Week 2:  OT Short Term Goal 1 (Week 2): STG = LTGs due to remaining LOS  Skilled Therapeutic Interventions/Progress Updates:    Skilled OT session focusing on functional transfers and family education in preparation for d/c. In therapy apt, therapist facilitated simulation of toilet transfer and bathing set up with hands on training involving pts son. Engaged in trouble shooting depending on home bathroom dimensions, pt and son provided with techniques for several scenarios. Pt and son return demonstrated bathroom transfers and bathing set up. They report no further questions. Son educated on fall risk factors such as cueing pt for proper RW maneuvering and sit<>stand technique. Therapist modeled poor technique challenging pt to spot the problem and direct therapist to correct it to indicate level of carryover in training. Pt able to identify problems in RW technique and safety concerns.   Therapy Documentation Precautions:  Precautions Precautions: Fall Precaution Comments: L inattention, L genu recurvatum with standing/gait Restrictions Weight Bearing Restrictions: No   Pain: No c/o pain Pain Assessment Pain Assessment: No/denies pain  See FIM for current functional status  Therapy/Group: Individual Therapy  Donnie Gedeon 07/16/2014, 3:20 PM

## 2014-07-16 NOTE — Progress Notes (Signed)
[facility] PHYSICAL MEDICINE & REHABILITATION     PROGRESS NOTE    Subjective/Complaints: Patient slept poorly last night, no issues with ADLs this morning. Had good BM without laxative Review of Systems - Negative except LLE weakness in addition to above  Objective: Vital Signs: Blood pressure 95/64, pulse 54, temperature 98.2 F (36.8 C), temperature source Oral, resp. rate 17, weight 47.537 kg (104 lb 12.8 oz), SpO2 100 %. No results found. No results for input(s): WBC, HGB, HCT, PLT in the last 72 hours. No results for input(s): NA, K, CL, GLUCOSE, BUN, CREATININE, CALCIUM in the last 72 hours.  Invalid input(s): CO CBG (last 3)  No results for input(s): GLUCAP in the last 72 hours.  Wt Readings from Last 3 Encounters:  07/10/14 47.537 kg (104 lb 12.8 oz)  07/01/14 52.617 kg (116 lb)  05/17/14 49.896 kg (110 lb)    Physical Exam:  Constitutional: She is oriented to person, place, and time. She appears well-developed and well-nourished.  HENT:  Head: Normocephalic and atraumatic.  Eyes: Conjunctivae are normal. Pupils are equal, round, and reactive to light.  Neck: Normal range of motion. Neck supple.  Cardiovascular: Normal rate.  Respiratory: Effort normal and breath sounds normal. No respiratory distress. She has no wheezes.  GI: Soft. Bowel sounds are normal. She exhibits no distension. There is no tenderness.  Musculoskeletal: She exhibits no edema or tenderness.  Neurological: She is alert and oriented to person, place, and time.   Able to follow 1 and 2 step commands without difficulty. Speechwithout dysarthria. Mild left facial weakness. Left hemiparesis with ataxia and sensory deficits. Skin: Skin is warm and dry.  Psych: pleasant, cooperative  Assessment/Plan: 1. Functional deficits secondary to right thalamic infarction which require 3+ hours per day of interdisciplinary therapy in a comprehensive inpatient rehab setting. Physiatrist is providing  close team supervision and 24 hour management of active medical problems listed below. Physiatrist and rehab team continue to assess barriers to discharge/monitor patient progress toward functional and medical goals. Team conference tomorrow FIM: FIM - Bathing Bathing Steps Patient Completed: Chest, Right Arm, Left Arm, Abdomen, Front perineal area, Buttocks, Right upper leg, Left upper leg, Right lower leg (including foot), Left lower leg (including foot) Bathing: 4: Steadying assist  FIM - Upper Body Dressing/Undressing Upper body dressing/undressing steps patient completed: Thread/unthread right bra strap, Thread/unthread left bra strap, Hook/unhook bra, Thread/unthread right sleeve of pullover shirt/dresss, Thread/unthread left sleeve of pullover shirt/dress, Put head through opening of pull over shirt/dress, Pull shirt over trunk Upper body dressing/undressing: 5: Set-up assist to: Obtain clothing/put away FIM - Lower Body Dressing/Undressing Lower body dressing/undressing steps patient completed: Thread/unthread right underwear leg, Thread/unthread left underwear leg, Pull underwear up/down, Thread/unthread right pants leg, Thread/unthread left pants leg, Pull pants up/down, Don/Doff right sock, Don/Doff left sock, Don/Doff right shoe, Don/Doff left shoe, Fasten/unfasten right shoe, Fasten/unfasten left shoe Lower body dressing/undressing: 5: Set-up assist to: Obtain clothing  FIM - Toileting Toileting steps completed by patient: Performs perineal hygiene Toileting Assistive Devices: Grab bar or rail for support Toileting: 2: Max-Patient completed 1 of 3 steps  FIM - Diplomatic Services operational officer Devices: Psychiatrist Transfers: 4-To toilet/BSC: Min A (steadying Pt. > 75%)  FIM - Banker Devices: Arm rests, Therapist, occupational: 4: Bed > Chair or W/C: Min A (steadying Pt. > 75%), 4: Chair or W/C > Bed: Min A  (steadying Pt. > 75%), 6: Supine > Sit: No assist, 6:  Sit > Supine: No assist  FIM - Locomotion: Wheelchair Distance: 315 Locomotion: Wheelchair: 5: Travels 150 ft or more: maneuvers on rugs and over door sills with supervision, cueing or coaxing FIM - Locomotion: Ambulation Locomotion: Ambulation Assistive Devices: Designer, industrial/productWalker - Rolling Ambulation/Gait Assistance: 4: Min assist Locomotion: Ambulation: 2: Travels 50 - 149 ft with minimal assistance (Pt.>75%)  Comprehension Comprehension Mode: Auditory Comprehension: 5-Follows basic conversation/direction: With extra time/assistive device  Expression Expression Mode: Verbal Expression: 5-Expresses basic 90% of the time/requires cueing < 10% of the time.  Social Interaction Social Interaction: 6-Interacts appropriately with others with medication or extra time (anti-anxiety, antidepressant).  Problem Solving Problem Solving: 5-Solves basic 90% of the time/requires cueing < 10% of the time  Memory Memory: 5-Recognizes or recalls 90% of the time/requires cueing < 10% of the time  Medical Problem List and Plan: 1. Functional deficits secondary to Right thalamic infarction with profound left hemisensory deficits (esp left leg) and left hemiparesis, lower extremity greater than upper extremity 2. DVT Prophylaxis/Anticoagulation: Pharmaceutical: Lovenox 3. Left hip pain/Pain Management: Prn Vicodin  4. Mood: LCSW to follow for evaluation and support.  5. Neuropsych: This patient is capable of making decisions on her own behalf. 6. Skin/Wound Care: routine pressure relief measures.  7. Fluids/Electrolytes/Nutrition: Monitor I/O.  8.CV- last EP and cardiology consults 3/30 and 07/04/14 did not rec PPM 9. HTN: Monitor BP every 8 hours. Blood pressures trending up and lisinopril resumed , blood pressure now on low side will reduce dose 10. CKD: Baseline Cr-1.31. Stable/improved LOS (Days) 12 A FACE TO FACE EVALUATION WAS  PERFORMED  Erick ColaceKIRSTEINS,ANDREW E 07/16/2014 8:34 AM

## 2014-07-16 NOTE — Progress Notes (Signed)
Occupational Therapy Session Note  Patient Details  Name: Tracy [name] MRN: [id number] Date of Birth: 08-14-1926  Today's Date: 07/16/2014 OT Individual Time: [id number] OT Individual Time Calculation (min): 60 min    Short Term Goals: Week 2:  OT Short Term Goal 1 (Week 2): STG = LTGs due to remaining LOS  Skilled Therapeutic Interventions/Progress Updates:    Skilled OT session focusing on ADL retraining and functional mobility using RW while completing home making tasks. Pt completed UB bathing/dressing/grooming w/c level at sink. Performed LB bathing/dressing in w/c at sink req verbal cues for safety during sit>stand. Pt ambulated to laundry room using RW and simulated laundry task. Educated on home laundry room organization to ensure items are in reach, positioning RW to side of appliances for reaching using good body mechanics, and avoiding loading laundry bag on RW causing imbalanced support- recommend son assist with set up. Therapist pushed pt to room in w/c and facilitated towel folding activity focusing on energy conservation techniques and integration of bilateral UEs.  Therapy Documentation Precautions:  Precautions Precautions: Fall Precaution Comments: L inattention, L genu recurvatum with standing/gait Restrictions Weight Bearing Restrictions: No General:   Vital Signs: Therapy Vitals Temp: 98.2 F (36.8 C) Temp Source: Oral Pulse Rate: (!) 54 Resp: 17 BP: 95/64 mmHg Patient Position (if appropriate): Lying Oxygen Therapy SpO2: 100 % O2 Device: Not Delivered Pain: no c/o pain    See FIM for current functional status  Therapy/Group: Individual Therapy  Tomy Khim 07/16/2014, 10:01 AM

## 2014-07-16 NOTE — Progress Notes (Signed)
Orthopedic Tech Progress Note Patient Details:  Tracy [name] 16-Dec-1926 [id number] Brace order completed by [facility] [employer] vendor. Patient ID: Tracy [name], female   DOB: 16-Dec-1926, 81 y.o.   MRN: [id number]   Jennye MoccasinHughes, [name] 07/16/2014, 3:20 PM

## 2014-07-16 NOTE — Progress Notes (Signed)
Multiple family members in the room asking about cardiology recommendations. This is third discussion regarding PPM placement and concerns about the cause of stroke.  Relayed that stroke due to SVD, no indications per cardiology notes and patient asymptomatic.  Patient in room and adamantly refusing to discuss having pacemaker placed.

## 2014-07-17 ENCOUNTER — Inpatient Hospital Stay (HOSPITAL_COMMUNITY): Payer: Medicare Other | Admitting: Physical Therapy

## 2014-07-17 ENCOUNTER — Inpatient Hospital Stay (HOSPITAL_COMMUNITY): Payer: Medicare Other | Admitting: Occupational Therapy

## 2014-07-17 MED ORDER — POLYETHYLENE GLYCOL 3350 17 G PO PACK: 17.0000 g | PACK | Freq: Every day | ORAL | Status: DC

## 2014-07-17 MED ORDER — BENAZEPRIL HCL 40 MG PO TABS: 20.0000 mg | ORAL_TABLET | Freq: Every day | ORAL | Status: DC

## 2014-07-17 NOTE — Progress Notes (Signed)
Occupational Therapy Session Note  Patient Details  Name: Tracy [name] MRN: [id number] Date of Birth: 01-Aug-1926  Today's Date: 07/17/2014 OT Individual Time: [id number] and 1415-1510 OT Individual Time Calculation (min): 60 min and 55 min   Short Term Goals: Week 2:  OT Short Term Goal 1 (Week 2): STG = LTGs due to remaining LOS  Skilled Therapeutic Interventions/Progress Updates:    1) Completed ADL retraining at overall setup assist with pt completing bathing and dressing at sit > stand level at sink.  Once set up at sink with all items, pt able to complete all tasks at sit > stand level without physical assist or cues, as pt does not want son to assist with self-care tasks.  Pt able to demonstrate carryover of education with attention to Lt and safety with sit > stand.  Ambulated to ADL apt with RW and supervision with good advancement of LLE during ambulation.  Bathroom transfers with supervision with use of RW and BSC.  Completed Five times Sit to Stand Test (FTSS) and educated on fall risk.  Method: Use a straight back chair with a solid seat that is 16-18" high. Ask participant to sit on the chair with arms folded across their chest.   Instructions: "Stand up and sit down as quickly as possible 5 times, keeping your arms folded across your chest."   Measurement: Stop timing when the participant stands the 5th time.  TIME: _20.16_____ (in seconds)  Times > 13.6 seconds is associated with increased disability and morbidity (Guralnik, 2000) Times > 15 seconds is predictive of recurrent falls in healthy individuals aged 79 and older (Buatois, et al., 2008) Normal performance values in community dwelling individuals aged 79 and older (Bohannon, 2006): o 60-69 years: 11.4 seconds o 70-79 years: 12.6 seconds o 80-89 years: 14.8 seconds  MCID: ? 2.3 seconds for Vestibular Disorders (Meretta, 2006)   2) Engaged in therapeutic activity with focus on functional mobility with RW, sit  <> stand, and standing balance with and without UE support.  Ambulated to therapy gym with RW with pt's son providing close supervision and intermittent cues for safety.  Completed Wii bowling activity in standing with sit <> stand with each turn with close supervision.  Pt required occasional verbal cues for hand placement to increase safety.  Min/steady assist for dynamic balance when completing bowling turn.  Pt and pt's son report ready for d/c and no further questions.  Pt returned to room ambulating with RW again with supervision.    Therapy Documentation Precautions:  Precautions Precautions: Fall Precaution Comments: Decreased attention to LLE during mobility Restrictions Weight Bearing Restrictions: No  Pain: Pain Assessment Pain Assessment: No/denies pain  See FIM for current functional status  Therapy/Group: Individual Therapy  Rosalio LoudHOXIE, Truong Delcastillo 07/17/2014, 10:36 AM

## 2014-07-17 NOTE — Progress Notes (Signed)
[facility] PHYSICAL MEDICINE & REHABILITATION     PROGRESS NOTE    Subjective/Complaints: Pt without issues , doesn't sleep well in hospital bed Review of Systems - Negative except LLE weakness in addition to above  Objective: Vital Signs: Blood pressure 117/61, pulse 43, temperature 98.6 F (37 C), temperature source Oral, resp. rate 17, weight 47.537 kg (104 lb 12.8 oz), SpO2 100 %. No results found. No results for input(s): WBC, HGB, HCT, PLT in the last 72 hours.  Recent Labs  07/16/14 1224  NA 141  K 3.9  CL 104  GLUCOSE 120*  BUN 20  CREATININE 1.04  CALCIUM 9.7   CBG (last 3)  No results for input(s): GLUCAP in the last 72 hours.  Wt Readings from Last 3 Encounters:  07/10/14 47.537 kg (104 lb 12.8 oz)  07/01/14 52.617 kg (116 lb)  05/17/14 49.896 kg (110 lb)    Physical Exam:  Constitutional: She is oriented to person, place, and time. She appears well-developed and well-nourished.  HENT:  Head: Normocephalic and atraumatic.  Eyes: Conjunctivae are normal. Pupils are equal, round, and reactive to light.  Neck: Normal range of motion. Neck supple.  Cardiovascular: Normal rate.  Respiratory: Effort normal and breath sounds normal. No respiratory distress. She has no wheezes.  GI: Soft. Bowel sounds are normal. She exhibits no distension. There is no tenderness.  Musculoskeletal: She exhibits no edema or tenderness.  Neurological: She is alert and oriented to person, place, and time.   Able to follow 1 and 2 step commands without difficulty. Speechwithout dysarthria. Mild left facial weakness. Left hemiparesis with ataxia and sensory deficits. Skin: Skin is warm and dry.  Psych: pleasant, cooperative  Assessment/Plan: 1. Functional deficits secondary to right thalamic infarction which require 3+ hours per day of interdisciplinary therapy in a comprehensive inpatient rehab setting. Physiatrist is providing close team supervision and 24 hour  management of active medical problems listed below. Physiatrist and rehab team continue to assess barriers to discharge/monitor patient progress toward functional and medical goals. Team conference today please see physician documentation under team conference tab, met with team face-to-face to discuss problems,progress, and goals. Formulized individual treatment plan based on medical history, underlying problem and comorbidities. Plan D/C in am FIM: FIM - Bathing Bathing Steps Patient Completed: Chest, Right Arm, Left Arm, Abdomen, Front perineal area, Buttocks, Right upper leg, Left upper leg, Right lower leg (including foot), Left lower leg (including foot) Bathing: 5: Supervision: Safety issues/verbal cues  FIM - Upper Body Dressing/Undressing Upper body dressing/undressing steps patient completed: Thread/unthread right bra strap, Thread/unthread left bra strap, Hook/unhook bra, Thread/unthread right sleeve of pullover shirt/dresss, Thread/unthread left sleeve of pullover shirt/dress, Put head through opening of pull over shirt/dress, Pull shirt over trunk Upper body dressing/undressing: 5: Set-up assist to: Obtain clothing/put away FIM - Lower Body Dressing/Undressing Lower body dressing/undressing steps patient completed: Thread/unthread right underwear leg, Thread/unthread left underwear leg, Pull underwear up/down, Thread/unthread right pants leg, Thread/unthread left pants leg, Pull pants up/down, Don/Doff right sock, Don/Doff left sock, Don/Doff right shoe, Don/Doff left shoe, Fasten/unfasten right shoe, Fasten/unfasten left shoe Lower body dressing/undressing: 5: Set-up assist to: Obtain clothing  FIM - Toileting Toileting steps completed by patient: Performs perineal hygiene Toileting Assistive Devices: Grab bar or rail for support Toileting: 2: Max-Patient completed 1 of 3 steps  FIM - Radio producer Devices: Recruitment consultant Transfers: 4-To  toilet/BSC: Min A (steadying Pt. > 75%)  FIM - Engineer, site  Assistive Devices: Arm rests, Copy: 4: Chair or W/C > Bed: Min A (steadying Pt. > 75%), 5: Supine > Sit: Supervision (verbal cues/safety issues), 5: Sit > Supine: Supervision (verbal cues/safety issues), 4: Bed > Chair or W/C: Min A (steadying Pt. > 75%)  FIM - Locomotion: Wheelchair Distance: 315 Locomotion: Wheelchair: 0: Activity did not occur (Pt ambulatory on unit) FIM - Locomotion: Ambulation Locomotion: Ambulation Assistive Devices: Walker - Rolling, Other (comment) (leather toe cap on L shoe) Ambulation/Gait Assistance: 4: Min guard Locomotion: Ambulation: 2: Travels 50 - 149 ft with minimal assistance (Pt.>75%)  Comprehension Comprehension Mode: Auditory Comprehension: 5-Follows basic conversation/direction: With extra time/assistive device  Expression Expression Mode: Verbal Expression: 5-Expresses basic 90% of the time/requires cueing < 10% of the time.  Social Interaction Social Interaction: 6-Interacts appropriately with others with medication or extra time (anti-anxiety, antidepressant).  Problem Solving Problem Solving: 5-Solves basic 90% of the time/requires cueing < 10% of the time  Memory Memory: 5-Recognizes or recalls 90% of the time/requires cueing < 10% of the time  Medical Problem List and Plan: 1. Functional deficits secondary to Right thalamic infarction with profound left hemisensory deficits (esp left leg) and left hemiparesis, lower extremity greater than upper extremity 2. DVT Prophylaxis/Anticoagulation: Pharmaceutical: Lovenox 3. Left hip pain/Pain Management: Prn Vicodin  4. Mood: LCSW to follow for evaluation and support.  5. Neuropsych: This patient is capable of making decisions on her own behalf. 6. Skin/Wound Care: routine pressure relief measures.  7. Fluids/Electrolytes/Nutrition: Monitor I/O.  8.CV- last EP and cardiology  consults 3/30 and 07/04/14 did not rec PPM 9. HTN: Monitor BP every 8 hours. Blood pressures trending up and lisinopril resumed , blood pressure now on low side will reduce dose 10. CKD: Baseline Cr-1.31. Stable/improved LOS (Days) 13 A FACE TO FACE EVALUATION WAS PERFORMED  Charlett Blake 07/17/2014 8:53 AM

## 2014-07-17 NOTE — Progress Notes (Signed)
Physical Therapy Discharge Summary  Patient Details  Name: Tracy [name] MRN: [id number] Date of Birth: May 21, 1926  Today's Date: 07/17/2014 PT Individual Time: 0930-1026 PT Individual Time Calculation (min): 56 min    Patient has met 11 of 11 long term goals due to improved balance, improved postural control, increased strength, ability to compensate for deficits and functional use of  left upper extremity and left lower extremity.  Patient to discharge at an ambulatory level Supervision.   Patient's care partner is independent to provide the necessary physical and cognitive assistance at discharge.  Reasons goals not met: N/A; all goals met or surpassed.  Recommendation:  Patient will benefit from ongoing skilled PT services in outpatient setting to continue to advance safe functional mobility, address ongoing impairments in stability/independence with functional mobility and minimize fall risk.  Equipment: youth rolling walker; left shoe modification (leather toe cap); and left Foot Up brace  Reasons for discharge: treatment goals met and discharge from hospital  Patient/family agrees with progress made and goals achieved: Yes   Skilled Therapeutic Interventions/Progress Updates Discharged evaluation initiated; see below for detailed findings. Educated pt and daughter, Tracy [name], on goals, findings, progress, and discharge plan. Pt and daughter verbalized understanding and were in full agreement with discharge plan.   PT Discharge Precautions/Restrictions Precautions Precautions: Fall Precaution Comments: Decreased attention to LLE during mobility Restrictions Weight Bearing Restrictions: No Vital Signs Therapy Vitals Temp: 98 F (36.7 C) Temp Source: Oral Pulse Rate: (!) 57 Resp: 18 BP: (!) 134/50 mmHg Patient Position (if appropriate): Sitting Oxygen Therapy SpO2: 100 % Pain Pain Assessment Pain Assessment: No/denies pain Vision/Perception    No change from  baseline. Cognition Overall Cognitive Status: Within Functional Limits for tasks assessed Orientation Level: Oriented X4 Safety/Judgment: Impaired Comments: Continues to require intermittent cues for safety with mobility. Sensation Sensation Light Touch: Impaired Detail Light Touch Impaired Details: Impaired LUE;Impaired LLE Stereognosis: Not tested Hot/Cold: Appears Intact Proprioception: Appears Intact Additional Comments: Pt reports dull sensation in LLE as compared with RLE. Pt reports "tingling" in L hand which has been present since CVA.  Pt with difficulty localizing light touch distal to the L knee and  Coordination Gross Motor Movements are Fluid and Coordinated: No Heel Shin Test: Excursion grossly equal in bilat LE's but motor control/smoothness of movement on RLE > LLE. Motor  Motor Motor: Other (comment) Motor - Discharge Observations: L hemiparesis  Mobility Bed Mobility Bed Mobility: Supine to Sit;Sit to Supine Supine to Sit: 6: Modified independent (Device/Increase time);HOB flat Sit to Supine: 6: Modified independent (Device/Increase time);HOB flat Transfers Transfers: Yes Sit to Stand: 5: Supervision;From chair/3-in-1;From bed Stand to Sit: 5: Supervision;To chair/3-in-1;To bed Locomotion  Ambulation Ambulation/Gait Assistance: 5: Supervision  Trunk/Postural Assessment  Cervical Assessment Cervical Assessment: Within Functional Limits Thoracic Assessment Thoracic Assessment: Within Functional Limits Lumbar Assessment Lumbar Assessment: Within Functional Limits Postural Control Postural Control: Deficits on evaluation Postural Limitations: In seated, pt exhibits posterior pelvic tilt but LE alignment, weightbearing grossly equal bilaterally.  Balance Balance Balance Assessed: Yes Standardized Balance Assessment Standardized Balance Assessment: Berg Balance Test Berg Balance Test Sit to Stand: Able to stand  independently using hands Standing  Unsupported: Able to stand 2 minutes with supervision Sitting with Back Unsupported but Feet Supported on Floor or Stool: Able to sit safely and securely 2 minutes Stand to Sit: Controls descent by using hands Transfers: Able to transfer with verbal cueing and /or supervision Standing Unsupported with Eyes Closed: Able to stand 10 seconds with supervision Standing  Ubsupported with Feet Together: Needs help to attain position and unable to hold for 15 seconds From Standing, Reach Forward with Outstretched Arm: Reaches forward but needs supervision From Standing Position, Pick up Object from Floor: Able to pick up shoe, needs supervision From Standing Position, Turn to Look Behind Over each Shoulder: Needs assist to keep from losing balance and falling Turn 360 Degrees: Needs assistance while turning Standing Unsupported, Alternately Place Feet on Step/Stool: Needs assistance to keep from falling or unable to try Standing Unsupported, One Foot in Front: Loses balance while stepping or standing Standing on One Leg: Unable to try or needs assist to prevent fall Total Score: 22 Dynamic Standing Balance Dynamic Standing - Balance Support: Right upper extremity supported Dynamic Standing - Level of Assistance: 5: Stand by assistance Dynamic Standing - Comments: See Berg Balance Scale above. Extremity Assessment  [name] Assessment [name] Assessment: Exceptions to WFL (ROM WFL, strength grossly 3+/5 overall) LUE Assessment LUE Assessment: Exceptions to [facility] (3-/5 shoulder, 3+/5 elbow and wrist) RLE Assessment RLE Assessment: Within Functional Limits LLE Assessment LLE Assessment: Exceptions to [facility] LLE Strength LLE Overall Strength: Deficits Left Hip Flexion: 4-/5 Left Knee Flexion: 4-/5 Left Knee Extension: 5/5 Left Ankle Dorsiflexion: 4+/5 Left Ankle Plantar Flexion: 4/5 Left Ankle Inversion: 4/5 Left Ankle Eversion: 3+/5  See FIM for current functional status  Courtnei Ruddell, Malva Cogan 07/17/2014,  5:27 PM

## 2014-07-17 NOTE — Progress Notes (Signed)
Social Work Patient ID: Tracy [name], female   DOB: Oct 06, 1926, 81 y.o.   MRN: [id number] Met with pt and daughter to discuss team conference progressing toward goals and discharge tomorrow.  Both pleased with her progress and feel ready for tomorrow. Informed them of the co-pays for OP and no co-pays for Home health.  Both feel home health will be fine and pt can not afford the high co-pays. Will make home health Referral. Should be receiving rolling walker and bedside commode today.

## 2014-07-17 NOTE — Progress Notes (Signed)
Orthopedic Tech Progress Note Patient Details:  Tracy [name] [birth year] [id number]  Patient ID: Tracy Finerphelia W Groot, female   DOB: [birth year], 81 y.o.   MRN: [id number] Called in advanced brace order; spoke with Ferdinand LangoKanisha  Dylan Ruotolo 07/17/2014, 12:59 PM

## 2014-07-17 NOTE — Discharge Summary (Signed)
Physician Discharge Summary  Patient ID: Tracy [name] MRN: [id number] DOB/AGE: [birth year] 81 y.o.  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 Active Problems:   Essential hypertension   SICK SINUS SYNDROME   Altered sensation due to stroke   Discharged Condition:  Stable  Labs:  Basic Metabolic Panel:  Recent Labs Lab 07/16/14 1224  NA 141  K 3.9  CL 104  CO2 28  GLUCOSE 120*  BUN 20  CREATININE 1.04  CALCIUM 9.7    CBC:    Component Value Date/Time   WBC 5.4 07/03/2014 0520   RBC 4.23 07/03/2014 0520   HGB 12.5 07/03/2014 0520   HCT 38.9 07/03/2014 0520   PLT 307 07/03/2014 0520   MCV 92.0 07/03/2014 0520   MCH 29.6 07/03/2014 0520   MCHC 32.1 07/03/2014 0520   RDW 14.2 07/03/2014 0520   LYMPHSABS 1.3 07/01/2014 1020   MONOABS 0.6 07/01/2014 1020   EOSABS 0.1 07/01/2014 1020   BASOSABS 0.0 07/01/2014 1020     CBG: No results for input(s): GLUCAP in the last 168 hours.    Brief HPI:   Tracy [name] is a 81 y.o. female with a history of HTN, SSS, mini-stroke 2002 with STM loss; who presented to ED 07/01/14 with complaints of progressive left sided numbness left sided numbness and weakness with subsequent fall and difficulty walking. Patient was found to be bradycardic with HR 40-50s and MRI/MRA brain done revealing 1 cm acute infarction in the right lateral thalamus, extensive old ischemic changes throughout the brain and no antegrade flow in R-VA. Pelvic films done due to complaints of left hip pain and revealed mild bilateral hip OA. Dr. 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 PPM not needed as patient asymptomatic and Loop recorder not needed per consult by cardiology. Therapy evaluations done and CIR was recommended by MD and Rehab  team    Hospital Course: Tracy [name] was admitted to rehab 07/04/2014 for inpatient therapies to consist of PT and OT at least three hours five days a week. Past admission physiatrist, therapy team and rehab RN have worked together to provide customized collaborative inpatient rehab. Speech therapy evaluation was done revealing intact swallow function and cognition was at baseline per family input therefore speech not needed during this stay. Blood pressures were monitored on tid basis and has been reasonably controlled. Po intake has been good and she is continent of bowel and bladder. She has had improvement in safety awareness with decrease in left inattention as well as improvement in left hemiparesis. She has made good gains during her rehab stay and is at supervision level. She will continue to receive follow up HHPT, HHOT and HHRN by [facility] past discharge.    Rehab course: During patient's stay in rehab weekly team conferences were held to monitor patient's progress, set goals and discuss barriers to discharge.  At admission, patient required moderate assistance with mobility and min assist with self care tasks. She has had improvement in activity tolerance, balance, postural control, as well as ability to compensate for deficits. She is has had improvement in functional use LUE  and LLE as well  awareness. She is able to ambulate 6360' with supervision and RW. She requires supervision to min guard assist for ambulating in community setting and min assist with mod  cues to navigate curb. She is able to complete ADL tasks past set up assist. Family education was done with son and daughter regarding all aspects of care and mobility.    Disposition: Home  Diet: Heart Healthy  Special Instructions: 1. Needs 24 hours supervision.     Medication List    STOP taking these medications        amLODipine-benazepril 10-40 MG per capsule  Commonly known as:  LOTREL      HYDROcodone-acetaminophen 5-325 MG per tablet  Commonly known as:  NORCO/VICODIN      TAKE these medications        aspirin 81 MG tablet  Take 81 mg by mouth daily.     atorvastatin 10 MG tablet  Commonly known as:  LIPITOR  Take 1 tablet (10 mg total) by mouth daily at 6 PM.     benazepril 40 MG tablet  Commonly known as:  LOTENSIN  Take 0.5 tablets (20 mg total) by mouth daily.     CALTRATE 600 1500 (600 CA) MG Tabs  Generic drug:  Calcium Carbonate  Take by mouth 2 (two) times daily.     clopidogrel 75 MG tablet  Commonly known as:  PLAVIX  TAKE 1 TABLET (75 MG TOTAL) BY MOUTH DAILY.     fenofibrate micronized 134 MG capsule  Commonly known as:  LOFIBRA  TAKE 1 CAPSULE (134 MG TOTAL) BY MOUTH DAILY.     latanoprost 0.005 % ophthalmic solution  Commonly known as:  XALATAN  Place into both eyes daily.     multivitamin capsule  Take 1 capsule by mouth daily. Centrum Silver     pentoxifylline 400 MG CR tablet  Commonly known as:  TRENTAL  Take 400 mg by mouth daily.     pentoxifylline 400 MG CR tablet  Commonly known as:  TRENTAL  TAKE 1 TABLET (400 MG TOTAL) BY MOUTH DAILY.     polyethylene glycol packet  Commonly known as:  MIRALAX / GLYCOLAX  Take 17 g by mouth daily.       Follow-up Information    Follow up with Oliver Barre, MD On 08/01/2014.   Specialties:  Internal Medicine, Radiology   Why:  APPT @ 3;30 PM   Contact information:   [address] Maggie Schwalbe [facility] [place] [address] [phone]       Follow up with Erick Colace, MD On 08/16/2014.   Specialty:  Physical Medicine and Rehabilitation   Why:  Be there at 10:30  for  10:45 am appointment    Contact information:   [address] [place] [address] [phone]       Follow up with Xu,Jindong, MD. Call today.   Specialty:  Neurology   Why:  for follow up appointment in 3-4 weeks.   Contact information:   [address] [place] [address] [phone]        Follow up with Hillis Range, MD On 08/28/2014.   Specialty:  Cardiology   Why:  appointment at 10:45 am   Contact information:   [address] [place] [address] [phone]       Signed: Jacquelynn Cree 07/18/2014, 9:40 AM

## 2014-07-17 NOTE — Plan of Care (Signed)
Problem: RH Furniture Transfers Goal: LTG Patient will perform furniture transfers w/assist (OT/PT LTG: Patient will perform furniture transfers with assistance (OT/PT).  Outcome: Completed/Met Date Met:  07/17/14 Goal surpassed, as patient performed furniture transfer with supervision on discharge evaluation.

## 2014-07-17 NOTE — Patient Care Conference (Signed)
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 Altered sensation due to stroke 07/15/2014  . Hip pain, acute   . Essential hypertension   . 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 Bradycardia 07/01/2014  . Elevated troponin 07/01/2014  . Dysuria 07/01/2014  . Constipation 07/01/2014  . Stroke 07/01/2014  . Right ankle pain 01/29/2014  . Rotator cuff tear arthropathy of right shoulder 06/19/2013  . Left rotator cuff tear arthropathy 05/17/2013  . Fatigue 02/15/2012  . Aneurysm of iliac artery 01/26/2012  . Peripheral vascular disease, unspecified 01/26/2012  . Left shoulder pain 02/09/2011  . Preventative health care 08/02/2010  . BRADYCARDIA 05/29/2010  . SICK SINUS SYNDROME 03/10/2010  . CAROTID STENOSIS 03/10/2010  . PVD WITH CLAUDICATION 02/23/2010  . CONSTIPATION 10/01/2008  . NECK MASS 10/01/2008  . Diabetes 08/01/2007  . DIVERTICULOSIS, COLON 08/01/2007  . SHOULDER PAIN, RIGHT 08/01/2007  . COLONIC POLYPS, HX OF 08/01/2007  . GERD 12/25/2006  . OSTEOPENIA 12/25/2006  . CEREBROVASCULAR ACCIDENT, HX OF 12/25/2006  . Hyperlipidemia 11/29/2006  . Essential hypertension 11/29/2006    Expected Discharge Date: Expected Discharge Date: 07/18/14  Team Members Present: Physician leading conference: Dr. Alysia Penna Social Worker Present: Ovidio Kin, LCSW Nurse Present: Heather Roberts, RN PT Present: Billie Ruddy, Renaye Rakers, PT OT Present: Meriel Pica, Jules Schick, OT SLP Present: Windell Moulding, SLP PPS Coordinator present : Daiva Nakayama, RN, CRRN     Current Status/Progress Goal Weekly Team Focus  Medical   Poor sleep, poor awareness left lower extremity with recurvatum  Home on 414  Discharge planning   Bowel/Bladder     cont of B & B   cont B & B     Swallow/Nutrition/ Hydration     na        ADL's   Supervision with bathing and LB dressing, Mod I grooming and UB dressing, showing good carryover of training  Mod I seated supervision/cueing with standing  Family education, transfer training and RW technique review, activity tolerance   Mobility   Supervision transfers, Min A ambulation and stairs  Ambulatory at supervision level  motor control in L knee, dynamic standing balance, safety awareness, obstacle negotiation and turning during ambulation, D/C planning   Communication     na        Safety/Cognition/ Behavioral Observations    no unsafe behaviors        Pain     no complaints of pain        Skin     monitor skin-no issues           *See Care Plan and progress notes for long and short-term goals.  Barriers to Discharge: Family needs additional training    Possible Resolutions to Barriers:  See above    Discharge  Planning/Teaching Needs:  Completing family education with son and daughter's-ready for discharge tomorrow      Team Discussion:  Has met supervision level goals and family education has been completed. L-knee control better-occasional inattention. Family is aware and the reason for supervision. L-foot up brace ordered.  Revisions to Treatment Plan:  none   Continued Need for Acute Rehabilitation Level of Care: The patient requires daily medical management by a physician with specialized training in physical medicine and rehabilitation for  the following conditions: Daily direction of a multidisciplinary physical rehabilitation program to ensure safe treatment while eliciting the highest outcome that is of practical value to the patient.: Yes Daily medical management of patient stability for increased activity during participation in an intensive rehabilitation regime.: Yes Daily analysis of laboratory values and/or radiology reports with any subsequent need for medication adjustment of medical intervention for : Neurological problems  Hayde Kilgour, Gardiner Rhyme 07/17/2014, 12:52 PM

## 2014-07-17 NOTE — Progress Notes (Signed)
Occupational Therapy Discharge Summary  Patient Details  Name: Tracy [name] MRN: [id number] Date of Birth: Jul 30, 1926  Patient has met 9 of 9 long term goals due to improved activity tolerance, improved balance, postural control, ability to compensate for deficits and improved awareness.  Patient to discharge at overall Supervision level.  Patient's care partner is independent to provide the necessary physical and cognitive assistance at discharge.    Reasons goals not met: N/A  Recommendation:  Patient will benefit from ongoing skilled OT services in outpatient setting to continue to advance functional skills in the area of BADL, iADL and Reduce care partner burden.  Equipment: BSC  Reasons for discharge: treatment goals met and discharge from hospital  Patient/family agrees with progress made and goals achieved: Yes  OT Discharge Precautions/Restrictions  Precautions Precautions: Fall Precaution Comments: Decreased attention to LLE during mobility Restrictions Weight Bearing Restrictions: No Pain Pain Assessment Pain Assessment: No/denies pain Pain Score: 0-No pain ADL  See FIM Vision/Perception  Vision- History Baseline Vision/History: Wears glasses Wears Glasses: At all times Patient Visual Report: No change from baseline Vision- Assessment Vision Assessment?: No apparent visual deficits Perception Comments: Lt inattention much improved, however ps still requires cues to attend to LLE and Lt visual field with mobility, as evidenced by poor obstacle negotiation on Lt side during ambulation.  Cognition Overall Cognitive Status: Within Functional Limits for tasks assessed Orientation Level: Oriented X4 Safety/Judgment: Impaired Comments: Continues to require intermittent cues for safety with mobility. Sensation Sensation Light Touch: Impaired Detail Light Touch Impaired Details: Impaired LUE;Impaired LLE Stereognosis: Not tested Hot/Cold: Appears  Intact Proprioception: Appears Intact Proprioception Impaired Details: Impaired LLE;Impaired LUE Additional Comments: Pt reports dull sensation in LLE as compared with RLE. Pt reports "tingling" in L hand which has been present since CVA.  Pt with difficulty localizing light touch distal to the L knee and  Coordination Gross Motor Movements are Fluid and Coordinated: No Fine Motor Movements are Fluid and Coordinated: No 9 Hole Peg Test: Rt: 36 seconds Lt: 35 seconds  Trunk/Postural Assessment  Cervical Assessment Cervical Assessment: Within Functional Limits Thoracic Assessment Thoracic Assessment: Within Functional Limits Lumbar Assessment Lumbar Assessment: Within Functional Limits Postural Control Postural Control: Deficits on evaluation Postural Limitations: In seated, pt exhibits posterior pelvic tilt but LE alignment, weightbearing grossly equal bilaterally.  Extremity/Trunk Assessment [name] Assessment [name] Assessment: Exceptions to WFL (ROM WFL, strength grossly 3+/5 overall) LUE Assessment LUE Assessment: Exceptions to [facility] (3-/5 shoulder, 3+/5 elbow and wrist)  See FIM for current functional status  Elmire Amrein, [facility] 07/17/2014, 3:51 PM

## 2014-07-17 NOTE — Progress Notes (Signed)
Physical Therapy Session Note  Patient Details  Name: Tracy [name] MRN: [id number] Date of Birth: [birth year]  Today's Date: 07/17/2014 PT Individual Time: 1300-1330 PT Individual Time Calculation (min): 30 min   Short Term Goals: Week 1:  PT Short Term Goal 1 (Week 1): STG's = LTG's secondary to anticipated LOS.  Skilled Therapeutic Interventions/Progress Updates:  Pt received in w/c in room with daughter present; agreeable to therapy.  Session focused on functional ambulation in a controlled and community environment, and negotiating curbs.  Pt ambulated with rolling walker and supervision 360' feet to room to midwest elevators.  Pt's daughter accompanied pt during session and provided supervision/min guard while pt ambulated 200' in community environment, requiring two rest break on bench outside.  Mod verbal cues for safe gait speed, increasing L hip flexion to avoid L toe drag/bring attention to L LE, and safety awareness provided by both SPT and daughter during community ambulation.  Blocked practice of curb negotiation initiated outside with pt and daughter, with min A and mod verbal cues for sequencing and safety; daughter verbalized and demonstrated understanding of task.  Pt performed dual task activity while ambulating in community environment and naming flowers.  PT/SPT stressed importance of wearing foot up brace when ambulating outside for safety since leather toe cap will not work on concrete.  Pt ambulated 16360' with daughter providing min guard back to room; verbal cues provided by SPT for safety (to avoid gap in floor when entering/exiting elevators).  Pt left in w/c in room with daughter present and all needs within reach.    Therapy Documentation Precautions:  Precautions Precautions: Fall Precaution Comments: Decreased attention to LLE during mobility Restrictions Weight Bearing Restrictions: No Pain: Pain Assessment Pain Assessment: No/denies pain Locomotion  : Ambulation Ambulation/Gait Assistance: 4: Min guard  Trunk/Postural Assessment : Cervical Assessment Cervical Assessment: Within Functional Limits Thoracic Assessment Thoracic Assessment: Within Functional Limits Lumbar Assessment Lumbar Assessment: Within Functional Limits Postural Control Postural Control: Deficits on evaluation Postural Limitations: In seated, pt exhibits posterior pelvic tilt but LE alignment, weightbearing grossly equal bilaterally.   See FIM for current functional status  Therapy/Group: Individual Therapy  Eulises Kijowski 07/17/2014, 1:37 PM

## 2014-07-18 MED ORDER — ATORVASTATIN CALCIUM 10 MG PO TABS: 10.0000 mg | ORAL_TABLET | Freq: Every day | ORAL | Status: DC

## 2014-07-18 NOTE — Progress Notes (Signed)
Patient and family received discharge instructions from Pam Love, PA-C with verbal understanding. Patient discharged to home with family and belongings. 

## 2014-07-18 NOTE — Discharge Instructions (Signed)
Inpatient Rehab Discharge Instructions  Horton FinerOphelia W Mcinerny Discharge date and time:  07/17/13  Activities/Precautions/ Functional Status: Activity: activity as tolerated with assistance Diet: cardiac diet Wound Care: none needed Functional status:  ___ No restrictions     ___ Walk up steps independently _X__ 24/7 supervision/assistance   ___ Walk up steps with assistance ___ Intermittent supervision/assistance  ___ Bathe/dress independently ___ Walk with walker     ___ Bathe/dress with assistance ___ Walk Independently    ___ Shower independently _X__ Walk with supervision    ___ Shower with assistance _X__ No alcohol     ___ Return to work/school ________  Special Instructions:   COMMUNITY REFERRALS UPON DISCHARGE:    Home Health:   PT, OT, RN  Agency:[facility] [phone] Date of last service:07/18/2014   Medical Equipment/Items Ordered:YOUTH ROLLING WALKER & BEDSIDE COMMODE  Agency/Supplier:[facility]    [phone]   GENERAL COMMUNITY RESOURCES FOR PATIENT/FAMILY: Support Groups:[facility] SUPPORT GROUP   STROKE/TIA DISCHARGE INSTRUCTIONS SMOKING Cigarette smoking nearly doubles your risk of having a stroke & is the single most alterable risk factor  If you smoke or have smoked in the last 12 months, you are advised to quit smoking for your health.  Most of the excess cardiovascular risk related to smoking disappears within a year of stopping.  Ask you doctor about anti-smoking medications  [place] Quit Line: 1-800-QUIT NOW  Free Smoking Cessation Classes [phone]  CHOLESTEROL Know your levels; limit fat & cholesterol in your diet  Lipid Panel     Component Value Date/Time   CHOL 130 07/02/2014 0230   TRIG 114 07/02/2014 0230   HDL 40 07/02/2014 0230   CHOLHDL 3.3 07/02/2014 0230   VLDL 23 07/02/2014 0230   LDLCALC 67 07/02/2014 0230      Many patients benefit from treatment even if their cholesterol is at goal.  Goal: Total Cholesterol (CHOL)  less than 160  Goal:  Triglycerides (TRIG) less than 150  Goal:  HDL greater than 40  Goal:  LDL (LDLCALC) less than 100   BLOOD PRESSURE American Stroke Association blood pressure target is less that 120/80 mm/Hg  Your discharge blood pressure is:  BP: (!) 101/38 mmHg  Monitor your blood pressure  Limit your salt and alcohol intake  Many individuals will require more than one medication for high blood pressure  DIABETES (A1c is a blood sugar average for last 3 months) Goal HGBA1c is under 7% (HBGA1c is blood sugar average for last 3 months)  Diabetes: Pre-diabetic    Lab Results  Component Value Date   HGBA1C 6.3* 07/02/2014     Your HGBA1c can be lowered with medications, healthy diet, and exercise.  Check your blood sugar as directed by your physician  Call your physician if you experience unexplained or low blood sugars.  PHYSICAL ACTIVITY/REHABILITATION Goal is 30 minutes at least 4 days per week  Activity: No driving, Therapies: See above Return to work: N/A  Activity decreases your risk of heart attack and stroke and makes your heart stronger.  It helps control your weight and blood pressure; helps you relax and can improve your mood.  Participate in a regular exercise program.  Talk with your doctor about the best form of exercise for you (dancing, walking, swimming, cycling).  DIET/WEIGHT Goal is to maintain a healthy weight  Your discharge diet is: Diet Heart  Fluid consistency:: Thin  liquids Your height is:  Height: 4\' 11"  (149.9 cm) Your current weight is: Weight:  47.537 kg (104 lb 12.8 oz) Your Body Mass Index (BMI) is:  BMI (Calculated): 21.2  Following the type of diet specifically designed for you will help prevent another stroke.  Your are at goal weight.    Your goal Body Mass Index (BMI) is 19-24.  Healthy food habits can help reduce 3 risk factors for stroke:  High cholesterol, hypertension, and excess weight.  RESOURCES Stroke/Support Group:   Call [phone]   STROKE EDUCATION PROVIDED/REVIEWED AND GIVEN TO PATIENT Stroke warning signs and symptoms How to activate emergency medical system (call 911). Medications prescribed at discharge. Need for follow-up after discharge. Personal risk factors for stroke. Pneumonia vaccine given:  Flu vaccine given:  My questions have been answered, the writing is legible, and I understand these instructions.  I will adhere to these goals & educational materials that have been provided to me after my discharge from the hospital.       My questions have been answered and I understand these instructions. I will adhere to these goals and the provided educational materials after my discharge from the hospital.  Patient/Caregiver Signature _______________________________ Date __________  Clinician Signature _______________________________________ Date __________  Please bring this form and your medication list with you to all your follow-up doctor's appointments.

## 2014-07-18 NOTE — Progress Notes (Signed)
Social Work Discharge Note Discharge Note  The overall goal for the admission was met for:   Discharge location: [place]  Length of Stay: Yes-14 DAYS  Discharge activity level: Yes-SUPERVISION LEVEL WITH CUES  Home/community participation: Yes  Services provided included: MD, RD, PT, OT, SLP, RN, CM, TR, Pharmacy and [place]: Private Insurance: [employer]  Follow-up services arranged: Home Health: [facility] CARE-PT,OT,RN, DME: [facility] and Patient/Family has no preference for HH/DME agencies  Comments (or additional information):FAMILY Ashley  Patient/Family verbalized understanding of follow-up arrangements: Yes  Individual responsible for coordination of the follow-up plan: ALONZO-SON & [facility]  Confirmed correct DME delivered: Elease Hashimoto 07/18/2014    Elease Hashimoto

## 2014-07-18 NOTE — Progress Notes (Signed)
[facility]     PROGRESS NOTE    Subjective/Complaints: Awaiting D/C Review of Systems - Negative except LLE weakness in addition to above  Objective: Vital Signs: Blood pressure 142/68, pulse 51, temperature 98.4 F (36.9 C), temperature source Oral, resp. rate 18, weight 47.537 kg (104 lb 12.8 oz), SpO2 98 %. No results found. No results for input(s): WBC, HGB, HCT, PLT in the last 72 hours.  Recent Labs  07/16/14 1224  NA 141  K 3.9  CL 104  GLUCOSE 120*  BUN 20  CREATININE 1.04  CALCIUM 9.7   CBG (last 3)  No results for input(s): GLUCAP in the last 72 hours.  Wt Readings from Last 3 Encounters:  07/10/14 47.537 kg (104 lb 12.8 oz)  07/01/14 52.617 kg (116 lb)  05/17/14 49.896 kg (110 lb)    Physical Exam:  Constitutional: She is oriented to person, place, and time. She appears well-developed and well-nourished.  HENT:  Head: Normocephalic and atraumatic.  Eyes: Conjunctivae are normal. Pupils are equal, round, and reactive to light.  Neck: Normal range of motion. Neck supple.  Cardiovascular: Normal rate.  Respiratory: Effort normal and breath sounds normal. No respiratory distress. She has no wheezes.  GI: Soft. Bowel sounds are normal. She exhibits no distension. There is no tenderness.  Musculoskeletal: She exhibits no edema or tenderness.  Neurological: She is alert and oriented to person, place, and time.   Psych: pleasant, cooperative  Assessment/Plan: 1. Functional deficits secondary to right thalamic infarction Stable for D/C today F/u PCP in 1-2 weeks F/u PM&R 3 weeks See D/C summary See D/C instructions FIM: FIM - Bathing Bathing Steps Patient Completed: Chest, Right Arm, Left Arm, Abdomen, Front perineal area, Buttocks, Right upper leg, Left upper leg, Right lower leg (including foot), Left lower leg (including foot) Bathing: 5: Set-up assist to: Obtain items  FIM - Upper Body  Dressing/Undressing Upper body dressing/undressing steps patient completed: Thread/unthread right bra strap, Thread/unthread left bra strap, Hook/unhook bra, Thread/unthread right sleeve of pullover shirt/dresss, Thread/unthread left sleeve of pullover shirt/dress, Put head through opening of pull over shirt/dress, Pull shirt over trunk Upper body dressing/undressing: 6: More than reasonable amount of time FIM - Lower Body Dressing/Undressing Lower body dressing/undressing steps patient completed: Thread/unthread right underwear leg, Thread/unthread left underwear leg, Pull underwear up/down, Thread/unthread right pants leg, Thread/unthread left pants leg, Pull pants up/down, Don/Doff right sock, Don/Doff left sock, Don/Doff right shoe, Don/Doff left shoe, Fasten/unfasten right shoe, Fasten/unfasten left shoe Lower body dressing/undressing: 5: Set-up assist to: Obtain clothing  FIM - Toileting Toileting steps completed by patient: Adjust clothing prior to toileting, Performs perineal hygiene, Adjust clothing after toileting Toileting Assistive Devices: Grab bar or rail for support Toileting: 5: Supervision: Safety issues/verbal cues  FIM - Diplomatic Services operational officerToilet Transfers Toilet Transfers Assistive Devices: Bedside commode, Art gallery managerWalker Toilet Transfers: 5-From toilet/BSC: Supervision (verbal cues/safety issues), 4-To toilet/BSC: Min A (steadying Pt. > 75%)  FIM - BankerBed/Chair Transfer Bed/Chair Transfer Assistive Devices: Arm rests, Therapist, occupationalWalker Bed/Chair Transfer: 6: Supine > Sit: No assist, 6: More than reasonable amt of time, 6: Sit > Supine: No assist, 5: Bed > Chair or W/C: Supervision (verbal cues/safety issues), 5: Chair or W/C > Bed: Supervision (verbal cues/safety issues)  FIM - Locomotion: Wheelchair Distance: 315 Locomotion: Wheelchair: 0: Activity did not occur (Pt ambulatory on unit) FIM - Locomotion: Ambulation Locomotion: Ambulation Assistive Devices: Walker - Rolling, Other (comment) (Leather toe cap on L  shoe) Ambulation/Gait Assistance: 5: Supervision Locomotion:  Ambulation: 5: Travels 150 ft or more with supervision/safety issues  Comprehension Comprehension Mode: Auditory Comprehension: 5-Follows basic conversation/direction: With extra time/assistive device  Expression Expression Mode: Verbal Expression: 5-Expresses basic 90% of the time/requires cueing < 10% of the time.  Social Interaction Social Interaction: 6-Interacts appropriately with others with medication or extra time (anti-anxiety, antidepressant).  Problem Solving Problem Solving: 5-Solves basic 90% of the time/requires cueing < 10% of the time  Memory Memory: 5-Recognizes or recalls 90% of the time/requires cueing < 10% of the time  Medical Problem List and Plan: 1. Functional deficits secondary to Right thalamic infarction with profound left hemisensory deficits (esp left leg) and left hemiparesis, lower extremity greater than upper extremity 2. DVT Prophylaxis/Anticoagulation: Pharmaceutical: Lovenox 3. Left hip pain/Pain Management: Prn Vicodin  4. Mood: LCSW to follow for evaluation and support.  5. Neuropsych: This patient is capable of making decisions on her own behalf. 6. Skin/Wound Care: routine pressure relief measures.  7. Fluids/Electrolytes/Nutrition: Monitor I/O.  8.CV- last EP and cardiology consults 3/30 and 07/04/14 did not rec PPM 9. HTN: Monitor BP every 8 hours. Blood pressures trending up and lisinopril resumed , blood pressure now on low side will reduce dose 10. CKD: Baseline Cr-1.31. Stable/improved LOS (Days) 14 A FACE TO FACE EVALUATION WAS PERFORMED  Erick Colace 07/18/2014 8:36 AM

## 2014-07-19 ENCOUNTER — Telehealth: Payer: Self-pay | Admitting: *Deleted

## 2014-07-19 NOTE — Telephone Encounter (Signed)
Transition Care Management Follow-up Telephone Call D/C 07/18/14  How have you been since you were released from the hospital? Pt states she is doing ok   Do you understand why you were in the hospital? YES   Do you understand the discharge instrcutions? YES  Items Reviewed:  Medications reviewed: YES  Allergies reviewed: YES  Dietary changes reviewed: YES  Referrals reviewed: NO, referral needed   Functional Questionnaire:   Activities of Daily Living (ADLs):   She states she are independent in the following: ambulation, bathing and hygiene, feeding, continence, grooming, toileting and dressing States she doesn't need assistance her family is there to help if she need help   Any transportation issues/concerns?: NO   Any patient concerns? NO   Confirmed importance and date/time of follow-up visits scheduled: YES, pt has made appt for 08/01/14 advise pt to keep appt   Confirmed with patient if condition begins to worsen call PCP or go to the ER.

## 2014-07-23 DIAGNOSIS — I10 Essential (primary) hypertension: Secondary | ICD-10-CM | POA: Diagnosis not present

## 2014-07-23 DIAGNOSIS — I69354 Hemiplegia and hemiparesis following cerebral infarction affecting left non-dominant side: Secondary | ICD-10-CM | POA: Diagnosis not present

## 2014-07-24 DIAGNOSIS — I69354 Hemiplegia and hemiparesis following cerebral infarction affecting left non-dominant side: Secondary | ICD-10-CM | POA: Diagnosis not present

## 2014-07-24 DIAGNOSIS — I10 Essential (primary) hypertension: Secondary | ICD-10-CM | POA: Diagnosis not present

## 2014-07-25 DIAGNOSIS — I10 Essential (primary) hypertension: Secondary | ICD-10-CM | POA: Diagnosis not present

## 2014-07-25 DIAGNOSIS — I69354 Hemiplegia and hemiparesis following cerebral infarction affecting left non-dominant side: Secondary | ICD-10-CM | POA: Diagnosis not present

## 2014-07-27 DIAGNOSIS — I69354 Hemiplegia and hemiparesis following cerebral infarction affecting left non-dominant side: Secondary | ICD-10-CM | POA: Diagnosis not present

## 2014-07-27 DIAGNOSIS — I10 Essential (primary) hypertension: Secondary | ICD-10-CM | POA: Diagnosis not present

## 2014-07-31 DIAGNOSIS — I69354 Hemiplegia and hemiparesis following cerebral infarction affecting left non-dominant side: Secondary | ICD-10-CM | POA: Diagnosis not present

## 2014-07-31 DIAGNOSIS — I10 Essential (primary) hypertension: Secondary | ICD-10-CM | POA: Diagnosis not present

## 2014-08-01 ENCOUNTER — Encounter: Payer: Self-pay | Admitting: Internal Medicine

## 2014-08-01 ENCOUNTER — Ambulatory Visit (INDEPENDENT_AMBULATORY_CARE_PROVIDER_SITE_OTHER): Payer: Medicare Other | Admitting: Internal Medicine

## 2014-08-01 VITALS — BP 132/80 | HR 47 | Temp 97.8°F | Resp 18 | Ht 59.0 in | Wt 108.1 lb

## 2014-08-01 DIAGNOSIS — I10 Essential (primary) hypertension: Secondary | ICD-10-CM

## 2014-08-01 DIAGNOSIS — I6330 Cerebral infarction due to thrombosis of unspecified cerebral artery: Secondary | ICD-10-CM | POA: Diagnosis not present

## 2014-08-01 DIAGNOSIS — M25512 Pain in left shoulder: Secondary | ICD-10-CM

## 2014-08-01 DIAGNOSIS — I639 Cerebral infarction, unspecified: Secondary | ICD-10-CM

## 2014-08-01 NOTE — Assessment & Plan Note (Signed)
Acute on recurrent, aggrevate by exertion yesterday, ok to use her few hydrocodone prn for this, OK to cont PT for now

## 2014-08-01 NOTE — Assessment & Plan Note (Signed)
stable overall by history and exam, recent data reviewed with pt, and pt to continue medical treatment as before,  to f/u any worsening symptoms or concerns BP Readings from Last 3 Encounters:  08/01/14 132/80  07/18/14 98/53  07/04/14 171/67

## 2014-08-01 NOTE — Assessment & Plan Note (Signed)
stable overall by history and exam, recent data reviewed with pt, and pt to continue medical treatment as before,  to f/u any worsening symptoms or concerns, to cont PT

## 2014-08-01 NOTE — Progress Notes (Signed)
Pre visit review using our clinic review tool, if applicable. No additional management support is needed unless otherwise documented below in the visit note. 
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
## 2014-08-01 NOTE — Patient Instructions (Signed)
Please continue all other medications as before, including the leftover few pills of the hydrocodone for the left shoulder pain  Please have the pharmacy call with any other refills you may need.  Please continue your efforts at being more active, low cholesterol diet, and weight control.  Please keep your appointments with your specialists as you may have planned

## 2014-08-02 DIAGNOSIS — I69354 Hemiplegia and hemiparesis following cerebral infarction affecting left non-dominant side: Secondary | ICD-10-CM | POA: Diagnosis not present

## 2014-08-02 DIAGNOSIS — I10 Essential (primary) hypertension: Secondary | ICD-10-CM | POA: Diagnosis not present

## 2014-08-02 NOTE — Addendum Note (Signed)
Addended by: Corwin LevinsJOHN, JAMES W on: 08/02/2014 09:45 PM   Modules accepted: Level of Service

## 2014-08-06 DIAGNOSIS — I10 Essential (primary) hypertension: Secondary | ICD-10-CM | POA: Diagnosis not present

## 2014-08-06 DIAGNOSIS — I69354 Hemiplegia and hemiparesis following cerebral infarction affecting left non-dominant side: Secondary | ICD-10-CM | POA: Diagnosis not present

## 2014-08-08 DIAGNOSIS — I10 Essential (primary) hypertension: Secondary | ICD-10-CM | POA: Diagnosis not present

## 2014-08-08 DIAGNOSIS — I69354 Hemiplegia and hemiparesis following cerebral infarction affecting left non-dominant side: Secondary | ICD-10-CM | POA: Diagnosis not present

## 2014-08-12 ENCOUNTER — Telehealth: Payer: Self-pay | Admitting: *Deleted

## 2014-08-12 NOTE — Telephone Encounter (Signed)
Mr Tracy [name] called because he was concerned that when Tracy [name] goes to have a bm there is blood in her stool.  I asked about the amount and he said it is a "fair amount".  I asked how often and she has gone 4-5 times today.  I asked when it started and it began on Saturday.  I told him he needs to take her to the emergency room and have her examined and give Dr Tracy [name] a call and ask for his additional direction.  Dr Tracy [name] is not in the office and Tracy [name] is not available.  I explained the Dr Tracy [name] was in charge of her rehab stay in the hospital and that we are not following her medication and do not see patients on emergency basis.  He will follow up with Dr Tracy [name] and take her to the ED to be evaluated unless otherwise instructed by Dr Tracy [name].

## 2014-08-13 ENCOUNTER — Encounter: Payer: Self-pay | Admitting: Internal Medicine

## 2014-08-13 ENCOUNTER — Other Ambulatory Visit (INDEPENDENT_AMBULATORY_CARE_PROVIDER_SITE_OTHER): Payer: Medicare Other

## 2014-08-13 ENCOUNTER — Ambulatory Visit (INDEPENDENT_AMBULATORY_CARE_PROVIDER_SITE_OTHER): Payer: Medicare Other | Admitting: Internal Medicine

## 2014-08-13 VITALS — BP 120/68 | HR 58 | Temp 98.1°F | Resp 18 | Ht 59.0 in | Wt 100.6 lb

## 2014-08-13 DIAGNOSIS — K921 Melena: Secondary | ICD-10-CM | POA: Diagnosis not present

## 2014-08-13 DIAGNOSIS — I10 Essential (primary) hypertension: Secondary | ICD-10-CM | POA: Diagnosis not present

## 2014-08-13 DIAGNOSIS — M25511 Pain in right shoulder: Secondary | ICD-10-CM

## 2014-08-13 LAB — CBC WITH DIFFERENTIAL/PLATELET
BASOS ABS: 0 10*3/uL (ref 0.0–0.1)
Basophils Relative: 0.5 % (ref 0.0–3.0)
Eosinophils Absolute: 0.1 10*3/uL (ref 0.0–0.7)
Eosinophils Relative: 1.8 % (ref 0.0–5.0)
HCT: 29.5 % — ABNORMAL LOW (ref 36.0–46.0)
Hemoglobin: 9.9 g/dL — ABNORMAL LOW (ref 12.0–15.0)
LYMPHS PCT: 20 % (ref 12.0–46.0)
Lymphs Abs: 1.4 10*3/uL (ref 0.7–4.0)
MCHC: 33.6 g/dL (ref 30.0–36.0)
MCV: 88.2 fl (ref 78.0–100.0)
MONOS PCT: 10.8 % (ref 3.0–12.0)
Monocytes Absolute: 0.7 10*3/uL (ref 0.1–1.0)
NEUTROS ABS: 4.6 10*3/uL (ref 1.4–7.7)
Neutrophils Relative %: 66.9 % (ref 43.0–77.0)
PLATELETS: 317 10*3/uL (ref 150.0–400.0)
RBC: 3.34 Mil/uL — ABNORMAL LOW (ref 3.87–5.11)
RDW: 15 % (ref 11.5–15.5)
WBC: 6.9 10*3/uL (ref 4.0–10.5)

## 2014-08-13 MED ORDER — HYDROCODONE-ACETAMINOPHEN 5-325 MG PO TABS: ORAL_TABLET | ORAL | Status: DC

## 2014-08-13 NOTE — Assessment & Plan Note (Signed)
stable overall by history and exam, recent data reviewed with pt, and pt to continue medical treatment as before,  to f/u any worsening symptoms or concerns BP Readings from Last 3 Encounters:  08/13/14 120/68  08/01/14 132/80  07/18/14 98/53

## 2014-08-13 NOTE — Progress Notes (Signed)
Subjective:    Patient ID: Tracy [name], female    DOB: 1927/01/23, 81 y.o.   MRN: [id number]  HPI  Here to f/u with son, refused to go to ER over the weekend, has 3 episodes BRBPR painless on plavix over 2 days, then none yesterday or today.  Still taking the plavix, did not stop. Pt denies chest pain, increased sob or doe, wheezing, orthopnea, PND, increased LE swelling, palpitations, dizziness or syncope.  Pt denies new neurological symptoms such as new headache, or facial or extremity weakness or numbness  No falls,  No prior hx of same. Last colonoscopy yrs ago per Dr Patterson/GI. Only pain is chronic recurring right shoudler and hip pain, asks for hydrocodone refill qhs prn only. Wt Readings from Last 3 Encounters:  08/13/14 100 lb 9.6 oz (45.632 kg)  08/01/14 108 lb 1.3 oz (49.025 kg)  07/10/14 104 lb 12.8 oz (47.537 kg)   Past Medical History  Diagnosis Date  . Hyperlipidemia   . HTN (hypertension)   . GERD (gastroesophageal reflux disease)   . CVA (cerebrovascular accident) 2002  . Osteopenia   . Hx of adenomatous colonic polyps 2007  . Diverticulosis of colon   . DM (diabetes mellitus), type 2   . PVD (peripheral vascular disease)     recently dx w/ carotid -left  int carotid >90% 50-60% rt int carotid 02/2010  . PVD (peripheral vascular disease)     lf  ABI 0.11 and rt 0.43  . CAROTID STENOSIS   . DIABETES MELLITUS, TYPE II   . DIVERTICULOSIS, COLON   . GERD   . HYPERLIPIDEMIA   . OSTEOPENIA   . Atrial fibrillation   . DVT (deep venous thrombosis)   . Sinus node dysfunction 2011  . Stroke   . Hypertension    Past Surgical History  Procedure Laterality Date  . Cns vascular stent per neurosurgeon    . Tubal ligation    . Right shoulder surgery  2010  . Cataract eye surgery    . Lazer eye surgery    . Iliac artery stent  2011    Bilateral CIA stent  . Carotid endarterectomy      reports that she has never smoked. She does not have any smokeless tobacco  history on file. She reports that she does not drink alcohol or use illicit drugs. family history includes Cancer in her mother; Diabetes in her sister. No Known Allergies Current Outpatient Prescriptions on File Prior to Visit  Medication Sig Dispense Refill  . aspirin 81 MG tablet Take 81 mg by mouth daily.      Marland Kitchen. atorvastatin (LIPITOR) 10 MG tablet Take 1 tablet (10 mg total) by mouth daily at 6 PM. 30 tablet 1  . benazepril (LOTENSIN) 40 MG tablet Take 0.5 tablets (20 mg total) by mouth daily. 30 tablet 0  . Calcium Carbonate (CALTRATE 600) 1500 MG TABS Take by mouth 2 (two) times daily.      . clopidogrel (PLAVIX) 75 MG tablet TAKE 1 TABLET (75 MG TOTAL) BY MOUTH DAILY. 90 tablet 3  . fenofibrate micronized (LOFIBRA) 134 MG capsule TAKE 1 CAPSULE (134 MG TOTAL) BY MOUTH DAILY. 90 capsule 3  . latanoprost (XALATAN) 0.005 % ophthalmic solution Place into both eyes daily.    . Multiple Vitamin (MULTIVITAMIN) capsule Take 1 capsule by mouth daily. Centrum Silver    . pentoxifylline (TRENTAL) 400 MG CR tablet TAKE 1 TABLET (400 MG TOTAL) BY MOUTH DAILY. 90 tablet  3  . pentoxifylline (TRENTAL) 400 MG CR tablet Take 400 mg by mouth daily.    . polyethylene glycol (MIRALAX / GLYCOLAX) packet Take 17 g by mouth daily. 14 each 0   No current facility-administered medications on file prior to visit.     Review of Systems  Constitutional: Negative for unusual diaphoresis or night sweats HENT: Negative for ringing in ear or discharge Eyes: Negative for double vision or worsening visual disturbance.  Respiratory: Negative for choking and stridor.   Gastrointestinal: Negative for vomiting or other signifcant bowel change Genitourinary: Negative for hematuria or change in urine volume.  Musculoskeletal: Negative for other MSK pain or swelling Skin: Negative for color change and worsening wound.  Neurological: Negative for tremors and numbness other than noted  Psychiatric/Behavioral: Negative for  decreased concentration or agitation other than above       Objective:   Physical Exam BP 120/68 mmHg  Pulse 58  Temp(Src) 98.1 F (36.7 C) (Oral)  Resp 18  Ht 4\' 11"  (1.499 m)  Wt 100 lb 9.6 oz (45.632 kg)  BMI 20.31 kg/m2  SpO2 98% VS noted,  Constitutional: Pt appears in no significant distress HENT: Head: NCAT.  Right Ear: External ear normal.  Left Ear: External ear normal.  Eyes: . Pupils are equal, round, and reactive to light. Conjunctivae and EOM are normal Neck: Normal range of motion. Neck supple.  Cardiovascular: Normal rate and regular rhythm.   Pulmonary/Chest: Effort normal and breath sounds without rales or wheezing.  Abd:  Soft, NT, ND, + BS DRE deferred per pt Neurological: Pt is alert. Not confused , motor grossly intact Skin: Skin is warm. No rash, no LE edema Psychiatric: Pt behavior is normal. No agitation.     Assessment & Plan:

## 2014-08-13 NOTE — Assessment & Plan Note (Signed)
Ok for qhs prn hydrocodone ,  to f/u any worsening symptoms or concerns

## 2014-08-13 NOTE — Progress Notes (Signed)
Pre visit review using our clinic review tool, if applicable. No additional management support is needed unless otherwise documented below in the visit note. 

## 2014-08-13 NOTE — Assessment & Plan Note (Signed)
Amount unclear, not orthostatic, and seems relatively small as asympt, for labs today, ok to cont plavix for now, but to stop plavix immed for any further bleeding, pt declines GI referral, etiology most likely diverticular bleeding

## 2014-08-13 NOTE — Addendum Note (Signed)
Addended by: Corwin LevinsJOHN, Renley Banwart W on: 08/13/2014 04:11 PM   Modules accepted: Orders

## 2014-08-13 NOTE — Patient Instructions (Signed)
Please continue all other medications as before, and refills have been done if requested  - the hydrocodone   Please stop the plavix for any sign of more bleeding  Please keep your appointments with your specialists as you may have planned  Please go to the LAB in the Basement (turn left off the elevator) for the tests to be done today  You will be contacted by phone if any changes need to be made immediately.  Otherwise, you will receive a letter about your results with an explanation, but please check with MyChart first.  Please remember to sign up for MyChart if you have not done so, as this will be important to you in the future with finding out test results, communicating by private email, and scheduling acute appointments online when needed.

## 2014-08-14 DIAGNOSIS — I69354 Hemiplegia and hemiparesis following cerebral infarction affecting left non-dominant side: Secondary | ICD-10-CM | POA: Diagnosis not present

## 2014-08-14 DIAGNOSIS — I10 Essential (primary) hypertension: Secondary | ICD-10-CM | POA: Diagnosis not present

## 2014-08-16 ENCOUNTER — Encounter: Payer: Self-pay | Admitting: Physical Medicine & Rehabilitation

## 2014-08-16 ENCOUNTER — Encounter: Payer: Medicare Other | Attending: Physical Medicine & Rehabilitation

## 2014-08-16 ENCOUNTER — Ambulatory Visit (HOSPITAL_BASED_OUTPATIENT_CLINIC_OR_DEPARTMENT_OTHER): Payer: Medicare Other | Admitting: Physical Medicine & Rehabilitation

## 2014-08-16 VITALS — BP 91/46 | HR 58 | Resp 14

## 2014-08-16 DIAGNOSIS — R208 Other disturbances of skin sensation: Secondary | ICD-10-CM | POA: Insufficient documentation

## 2014-08-16 DIAGNOSIS — G8314 Monoplegia of lower limb affecting left nondominant side: Secondary | ICD-10-CM | POA: Insufficient documentation

## 2014-08-16 DIAGNOSIS — I69398 Other sequelae of cerebral infarction: Secondary | ICD-10-CM | POA: Insufficient documentation

## 2014-08-16 DIAGNOSIS — R269 Unspecified abnormalities of gait and mobility: Secondary | ICD-10-CM

## 2014-08-16 DIAGNOSIS — IMO0002 Reserved for concepts with insufficient information to code with codable children: Secondary | ICD-10-CM

## 2014-08-16 NOTE — Patient Instructions (Signed)
Call if you would  wish to be set up for outpatient physical therapy

## 2014-08-16 NOTE — Progress Notes (Signed)
Subjective:    Patient ID: Tracy [name], female    DOB: Nov 27, 1926, 81 y.o.   MRN: [id number] 81 y.o. female with a history of HTN, SSS, mini-stroke 2002 with STM loss;  who presented to ED 07/01/14 with complaints of progressive left sided numbness left sided numbness and weakness with subsequent fall and difficulty walking. Patient was found to be bradycardic with HR 40-50s and MRI/MRA brain done revealing 1 cm acute infarction in the right lateral thalamus, extensive old ischemic changes throughout the brain and no antegrade flow in R-VA.  Pelvic films done due to complaints of left hip pain and revealed mild bilateral hip OA. Dr. 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 PPM not needed as patient asymptomatic and Loop recorder not needed per consult by cardiology Admit date: 07/04/2014 Discharge date: 07/18/2014  HPI See previous phone call about having blood in her stools.  She saw PCP and he removed plavix.  Takes a "bird bath", Dresses independently. Starting to cook again. Completed home health PT and OT. Still walking with a walker but has a new wheeled Cadillac walker,Uses walker mainly outside the house. No Falls Pain Inventory Average Pain 3 Pain Right Now 3 My pain is tingling  In the last 24 hours, has pain interfered with the following? General activity 3 Relation with others 0 Enjoyment of life 5 What TIME of day is your pain at its worst? night Sleep (in general) Poor  Pain is worse with: sitting and standing Pain improves with: medication Relief from Meds: 6  Mobility walk with assistance use a walker  Function I need assistance with the following:  meal prep, household duties and shopping  Neuro/Psych numbness tingling trouble walking depression  Prior Studies Any changes since last visit?  no  Physicians involved in your care Any changes since last visit?   no Primary care Tracy [name]   Family History  Problem Relation Age of Onset  . Diabetes Sister     Amputation  . Cancer Mother     colon   History   Social History  . Marital Status: Widowed    Spouse Name: N/A  . Number of Children: 5  . Years of Education: N/A   Occupational History  . retired [employer]   Social History Main Topics  . Smoking status: Never Smoker   . Smokeless tobacco: Not on file  . Alcohol Use: No  . Drug Use: No  . Sexual Activity: Not on file   Other Topics Concern  . None   Social History Narrative   ** Merged History Encounter **       Past Surgical History  Procedure Laterality Date  . Cns vascular stent per neurosurgeon    . Tubal ligation    . Right shoulder surgery  2010  . Cataract eye surgery    . Lazer eye surgery    . Iliac artery stent  2011    Bilateral CIA stent  . Carotid endarterectomy     Past Medical History  Diagnosis Date  . Hyperlipidemia   . HTN (hypertension)   . GERD (gastroesophageal reflux disease)   . CVA (cerebrovascular accident) 2002  . Osteopenia   . Hx of adenomatous colonic polyps 2007  . Diverticulosis of colon   . DM (diabetes mellitus), type 2   . PVD (peripheral vascular disease)     recently dx w/ carotid -left  int carotid >  90% 50-60% rt int carotid 02/2010  . PVD (peripheral vascular disease)     lf  ABI 0.11 and rt 0.43  . CAROTID STENOSIS   . DIABETES MELLITUS, TYPE II   . DIVERTICULOSIS, COLON   . GERD   . HYPERLIPIDEMIA   . OSTEOPENIA   . Atrial fibrillation   . DVT (deep venous thrombosis)   . Sinus node dysfunction 2011  . Stroke   . Hypertension    BP 91/46 mmHg  Pulse 58  Resp 14  SpO2 98%  Opioid Risk Score:   Fall Risk Score: Moderate Fall Risk (6-13 points) (educated and given handout on fall prevention in the home)`1  Depression screen PHQ 2/9  Dep[facility] screen [facility] 08/16/2014 05/17/2014 05/16/2013  Decreased Interest 2 0 0  Down, Depressed, Hopeless 3 0 1  PHQ  - 2 Score 5 0 1  Altered sleeping 3 - -  Tired, decreased energy 2 - -  Change in appetite 2 - -  Feeling bad or failure about yourself  3 - -  Trouble concentrating 0 - -  Moving slowly or fidgety/restless 2 - -  Suicidal thoughts 0 - -  PHQ-9 Score 17 - -      Review of Systems  Constitutional: Positive for unexpected weight change.  Musculoskeletal: Positive for gait problem.  Neurological: Positive for numbness.       Tingling  Hematological: Bruises/bleeds easily.  Psychiatric/Behavioral: Positive for dysphoric mood.  All other systems reviewed and are negative.      Objective:   Physical Exam Motor strength is 5/5 bilateral deltoid, biceps, triceps, grip 5/5 in the right hip flexor knee extensor ankle dorsal flexion 4/5 in the left hip flexor and knee extensor ankle dorsal flexor Gait tends to lean toward left side, no evidence of toe drag or knee instability, ambulates without assisted device but needs to hold on to objects on her left side from time to time Mood and affect are appropriate Gen. No acute distress Sensation mildly diminished left lower extremity Intact sensation left upper extremity Intact sensation right upper and right lower extremity  No evidence of facial droop       Assessment & Plan:  1. Right thalamic infarct resulting in primary left lower extremity weakness and sensory deficits. She is not back to her baseline yet however she does not wish to go to outpatient therapy because of the co-pays involved. Recommend continue home exercise program Follow-up with PCP for primary stroke prevention Follow up with neurology for secondary stroke prevention Physical medicine rehabilitation follow-up on an as-needed basis Discussed with patient and her son

## 2014-08-20 DIAGNOSIS — I10 Essential (primary) hypertension: Secondary | ICD-10-CM | POA: Diagnosis not present

## 2014-08-20 DIAGNOSIS — I69354 Hemiplegia and hemiparesis following cerebral infarction affecting left non-dominant side: Secondary | ICD-10-CM | POA: Diagnosis not present

## 2014-08-21 ENCOUNTER — Ambulatory Visit: Payer: Self-pay | Admitting: Internal Medicine

## 2014-08-22 ENCOUNTER — Ambulatory Visit (INDEPENDENT_AMBULATORY_CARE_PROVIDER_SITE_OTHER): Payer: Medicare Other | Admitting: Internal Medicine

## 2014-08-22 ENCOUNTER — Other Ambulatory Visit (INDEPENDENT_AMBULATORY_CARE_PROVIDER_SITE_OTHER): Payer: Medicare Other

## 2014-08-22 VITALS — BP 106/62 | HR 50 | Temp 97.8°F | Wt 104.0 lb

## 2014-08-22 DIAGNOSIS — K922 Gastrointestinal hemorrhage, unspecified: Secondary | ICD-10-CM

## 2014-08-22 DIAGNOSIS — I639 Cerebral infarction, unspecified: Secondary | ICD-10-CM

## 2014-08-22 DIAGNOSIS — I10 Essential (primary) hypertension: Secondary | ICD-10-CM

## 2014-08-22 DIAGNOSIS — I6330 Cerebral infarction due to thrombosis of unspecified cerebral artery: Secondary | ICD-10-CM | POA: Diagnosis not present

## 2014-08-22 LAB — CBC WITH DIFFERENTIAL/PLATELET
Basophils Absolute: 0 10*3/uL (ref 0.0–0.1)
Basophils Relative: 0.2 % (ref 0.0–3.0)
Eosinophils Absolute: 0.1 10*3/uL (ref 0.0–0.7)
Eosinophils Relative: 1.8 % (ref 0.0–5.0)
HCT: 29.8 % — ABNORMAL LOW (ref 36.0–46.0)
Hemoglobin: 9.9 g/dL — ABNORMAL LOW (ref 12.0–15.0)
Lymphocytes Relative: 19.1 % (ref 12.0–46.0)
Lymphs Abs: 1.3 10*3/uL (ref 0.7–4.0)
MCHC: 33 g/dL (ref 30.0–36.0)
MCV: 89.7 fl (ref 78.0–100.0)
Monocytes Absolute: 0.8 10*3/uL (ref 0.1–1.0)
Monocytes Relative: 11.6 % (ref 3.0–12.0)
NEUTROS ABS: 4.7 10*3/uL (ref 1.4–7.7)
Neutrophils Relative %: 67.3 % (ref 43.0–77.0)
Platelets: 395 10*3/uL (ref 150.0–400.0)
RBC: 3.33 Mil/uL — AB (ref 3.87–5.11)
RDW: 15.5 % (ref 11.5–15.5)
WBC: 7 10*3/uL (ref 4.0–10.5)

## 2014-08-22 NOTE — Progress Notes (Signed)
Pre visit review using our clinic review tool, if applicable. No additional management support is needed unless otherwise documented below in the visit note. 

## 2014-08-22 NOTE — Patient Instructions (Signed)
OK to stay off the plavix due to the recent bleeding  Please continue all other medications as before  Please have the pharmacy call with any other refills you may need.  Please continue your efforts at being more active, low cholesterol diet, and weight control.  Please keep your appointments with your specialists as you may have planned  Please go to the LAB in the Basement (turn left off the elevator) for the tests to be done today  You will be contacted by phone if any changes need to be made immediately.  Otherwise, you will receive a letter about your results with an explanation, but please check with MyChart first.  Please remember to sign up for MyChart if you have not done so, as this will be important to you in the future with finding out test results, communicating by private email, and scheduling acute appointments online when needed.  Please return in 3 months, or sooner if needed

## 2014-08-22 NOTE — Progress Notes (Signed)
Subjective:    Patient ID: Tracy [name], female    DOB: 11-Jan-1927, 81 y.o.   MRN: [id number]  HPI  Here to f/u recent LGI bleeding on plavix/asa, Here to f/u; overall doing ok,  No further overt bleeding. Pt denies chest pain, increasing sob or doe, wheezing, orthopnea, PND, increased LE swelling, palpitations, dizziness or syncope.  Pt denies new neurological symptoms such as new headache, or facial or extremity weakness or numbness.  Pt denies polydipsia, polyuria, or low sugar episode.   Pt denies new neurological symptoms such as new headache, or facial or extremity weakness or numbness.   Pt states overall good compliance with meds.  No new complaints Past Medical History  Diagnosis Date  . Hyperlipidemia   . HTN (hypertension)   . GERD (gastroesophageal reflux disease)   . CVA (cerebrovascular accident) 2002  . Osteopenia   . Hx of adenomatous colonic polyps 2007  . Diverticulosis of colon   . DM (diabetes mellitus), type 2   . PVD (peripheral vascular disease)     recently dx w/ carotid -left  int carotid >90% 50-60% rt int carotid 02/2010  . PVD (peripheral vascular disease)     lf  ABI 0.11 and rt 0.43  . CAROTID STENOSIS   . DIABETES MELLITUS, TYPE II   . DIVERTICULOSIS, COLON   . GERD   . HYPERLIPIDEMIA   . OSTEOPENIA   . Atrial fibrillation   . DVT (deep venous thrombosis)   . Sinus node dysfunction 2011  . Stroke   . Hypertension    Past Surgical History  Procedure Laterality Date  . Cns vascular stent per neurosurgeon    . Tubal ligation    . Right shoulder surgery  2010  . Cataract eye surgery    . Lazer eye surgery    . Iliac artery stent  2011    Bilateral CIA stent  . Carotid endarterectomy      reports that she has never smoked. She does not have any smokeless tobacco history on file. She reports that she does not drink alcohol or use illicit drugs. family history includes Cancer in her mother; Diabetes in her sister. No Known Allergies Current  Outpatient Prescriptions on File Prior to Visit  Medication Sig Dispense Refill  . aspirin 81 MG tablet Take 81 mg by mouth daily.      Marland Kitchen. atorvastatin (LIPITOR) 10 MG tablet Take 1 tablet (10 mg total) by mouth daily at 6 PM. 30 tablet 1  . benazepril (LOTENSIN) 40 MG tablet Take 0.5 tablets (20 mg total) by mouth daily. 30 tablet 0  . Calcium Carbonate (CALTRATE 600) 1500 MG TABS Take by mouth 2 (two) times daily.      . fenofibrate micronized (LOFIBRA) 134 MG capsule TAKE 1 CAPSULE (134 MG TOTAL) BY MOUTH DAILY. 90 capsule 3  . HYDROcodone-acetaminophen (NORCO/VICODIN) 5-325 MG per tablet 1 tab by mouth at bedtime as needed for pain 30 tablet 0  . latanoprost (XALATAN) 0.005 % ophthalmic solution Place into both eyes daily.    . Multiple Vitamin (MULTIVITAMIN) capsule Take 1 capsule by mouth daily. Centrum Silver    . pentoxifylline (TRENTAL) 400 MG CR tablet TAKE 1 TABLET (400 MG TOTAL) BY MOUTH DAILY. 90 tablet 3  . pentoxifylline (TRENTAL) 400 MG CR tablet Take 400 mg by mouth daily.    . polyethylene glycol (MIRALAX / GLYCOLAX) packet Take 17 g by mouth daily. 14 each 0   No current facility-administered medications  on file prior to visit.   Review of Systems  Constitutional: Negative for unusual diaphoresis or night sweats HENT: Negative for ringing in ear or discharge Eyes: Negative for double vision or worsening visual disturbance.  Respiratory: Negative for choking and stridor.   Gastrointestinal: Negative for vomiting or other signifcant bowel change Genitourinary: Negative for hematuria or change in urine volume.  Musculoskeletal: Negative for other MSK pain or swelling Skin: Negative for color change and worsening wound.  Neurological: Negative for tremors and numbness other than noted  Psychiatric/Behavioral: Negative for decreased concentration or agitation other than above       Objective:   Physical Exam BP 106/62 mmHg  Pulse 50  Temp(Src) 97.8 F (36.6 C) (Oral)   Wt 104 lb (47.174 kg)  SpO2 98% VS noted,  Constitutional: Pt appears in no significant distress HENT: Head: NCAT.  Right Ear: External ear normal.  Left Ear: External ear normal.  Eyes: . Pupils are equal, round, and reactive to light. Conjunctivae and EOM are normal Neck: Normal range of motion. Neck supple.  Cardiovascular: Normal rate and regular rhythm.   Pulmonary/Chest: Effort normal and breath sounds without rales or wheezing.  Abd:  Soft, NT, ND, + BS Neurological: Pt is alert. Not confused , motor grossly intact Skin: Skin is warm. No rash, no LE edema Psychiatric: Pt behavior is normal. No agitation.     Assessment & Plan:

## 2014-08-23 ENCOUNTER — Encounter: Payer: Self-pay | Admitting: Internal Medicine

## 2014-08-23 LAB — IBC PANEL
Iron: 70 ug/dL (ref 42–145)
Saturation Ratios: 14.8 % — ABNORMAL LOW (ref 20.0–50.0)
Transferrin: 337 mg/dL (ref 212.0–360.0)

## 2014-08-24 ENCOUNTER — Encounter: Payer: Self-pay | Admitting: Internal Medicine

## 2014-08-24 NOTE — Assessment & Plan Note (Signed)
stable overall by history and exam, recent data reviewed with pt, and pt to continue medical treatment as before,  to f/u any worsening symptoms or concerns BP Readings from Last 3 Encounters:  08/22/14 106/62  08/16/14 91/46  08/13/14 120/68

## 2014-08-24 NOTE — Assessment & Plan Note (Signed)
stable overall by history and exam, recent data reviewed with pt, and pt to continue medical treatment as before - for asa only no further plavix, ,  to f/u any worsening symptoms or concerns Lab Results  Component Value Date   WBC 7.0 08/22/2014   HGB 9.9* 08/22/2014   HCT 29.8* 08/22/2014   MCV 89.7 08/22/2014   PLT 395.0 08/22/2014

## 2014-08-24 NOTE — Assessment & Plan Note (Signed)
Stable, for asa only due to recent large GI bleed, fortunately did not require hospn

## 2014-08-27 DIAGNOSIS — I10 Essential (primary) hypertension: Secondary | ICD-10-CM | POA: Diagnosis not present

## 2014-08-27 DIAGNOSIS — I69354 Hemiplegia and hemiparesis following cerebral infarction affecting left non-dominant side: Secondary | ICD-10-CM | POA: Diagnosis not present

## 2014-08-28 ENCOUNTER — Ambulatory Visit (INDEPENDENT_AMBULATORY_CARE_PROVIDER_SITE_OTHER): Payer: Medicare Other | Admitting: Internal Medicine

## 2014-08-28 ENCOUNTER — Encounter: Payer: Self-pay | Admitting: Internal Medicine

## 2014-08-28 VITALS — BP 112/54 | HR 52 | Ht 59.0 in | Wt 105.4 lb

## 2014-08-28 DIAGNOSIS — I10 Essential (primary) hypertension: Secondary | ICD-10-CM | POA: Diagnosis not present

## 2014-08-28 DIAGNOSIS — R001 Bradycardia, unspecified: Secondary | ICD-10-CM | POA: Diagnosis not present

## 2014-08-28 DIAGNOSIS — I639 Cerebral infarction, unspecified: Secondary | ICD-10-CM

## 2014-08-28 NOTE — Patient Instructions (Signed)
Medication Instructions:  Your physician has recommended you make the following change in your medication:  1) stop Atorvastatin--until your follow up with Dr Francesca OmanXu    Labwork: None ordered  Testing/Procedures: Will have Vascular and Vein call you to schedule dopplers of leg  Follow-Up: Your physician recommends that you schedule a follow-up appointment as needed with Dr Johney FrameAllred   Any Other Special Instructions Will Be Listed Below (If Applicable).

## 2014-08-29 ENCOUNTER — Telehealth (HOSPITAL_COMMUNITY): Payer: Self-pay | Admitting: Family

## 2014-08-29 NOTE — Telephone Encounter (Signed)
-----   Message from Annye RuskSuzanne L Nickel, NP sent at 08/29/2014 12:39 PM EDT ----- Regarding: add left LE arterial Duplex Gloris HamMelinda, Kay, Please add left LE arterial Duplex to pt's next visit which is already scheduled in December. Pt already has other testing scheduled, possibly ABI's and aortoiliacs. Indication: intermittent claudication. Thank you, Rosalita ChessmanSuzanne  ----- Message -----    From: Deliah BostonKelly F Lanier, RN    Sent: 08/28/2014  11:44 AM      To: Hillis RangeJames Allred, MD, Annye RuskSuzanne L Nickel, NP  Dr Johney FrameAllred saw patient today and he was wanting non-invasive lower extremity dopplers(lft leg pain) for her.  She has multiple appointments with your office and he is going to see prn going forward. Can you order for patient?  Thanks for your help.  Dennis BastKelly Lanier, RN

## 2014-08-29 NOTE — Progress Notes (Signed)
Electrophysiology Office Note   Date:  08/29/2014   ID:  Tracy FranklinOphelia W [name], DOB 01-29-1927, MRN [id number]  PCP:  Oliver BarreJames John, MD  Primary Electrophysiologist: Hillis RangeJames Nigel Wessman, MD    Chief Complaint  Patient presents with  . SSS  . Bradycardia     History of Present Illness: Tracy [name] is a 81 y.o. female who presents today for electrophysiology evaluation.   Making slow recovery since her recent hospitalization for stroke.  She reports some fatigue but in general feels that her energy is improving.  Her primary concern is with pain in her left leg.  This has been present since her hospitalization.    Today, she denies symptoms of palpitations, chest pain, shortness of breath, orthopnea, PND, lower extremity edema, claudication, dizziness, presyncope, syncope, bleeding, or neurologic sequela. The patient is tolerating medications without difficulties and is otherwise without complaint today.    Past Medical History  Diagnosis Date  . Hyperlipidemia   . HTN (hypertension)   . GERD (gastroesophageal reflux disease)   . CVA (cerebrovascular accident) 2002  . Osteopenia   . Hx of adenomatous colonic polyps 2007  . Diverticulosis of colon   . DM (diabetes mellitus), type 2   . PVD (peripheral vascular disease)     recently dx w/ carotid -left  int carotid >90% 50-60% rt int carotid 02/2010  . PVD (peripheral vascular disease)     lf  ABI 0.11 and rt 0.43  . CAROTID STENOSIS   . DIABETES MELLITUS, TYPE II   . DIVERTICULOSIS, COLON   . GERD   . HYPERLIPIDEMIA   . OSTEOPENIA   . Atrial fibrillation   . DVT (deep venous thrombosis)   . Sinus node dysfunction 2011  . Stroke   . Hypertension    Past Surgical History  Procedure Laterality Date  . Cns vascular stent per neurosurgeon    . Tubal ligation    . Right shoulder surgery  2010  . Cataract eye surgery    . Lazer eye surgery    . Iliac artery stent  2011    Bilateral CIA stent  . Carotid endarterectomy        Current Outpatient Prescriptions  Medication Sig Dispense Refill  . aspirin 81 MG tablet Take 81 mg by mouth daily.      . benazepril (LOTENSIN) 40 MG tablet Take 0.5 tablets (20 mg total) by mouth daily. 30 tablet 0  . Calcium Carbonate (CALTRATE 600) 1500 MG TABS Take 1 tablet by mouth 2 (two) times daily.     . fenofibrate micronized (LOFIBRA) 134 MG capsule TAKE 1 CAPSULE (134 MG TOTAL) BY MOUTH DAILY. 90 capsule 3  . HYDROcodone-acetaminophen (NORCO/VICODIN) 5-325 MG per tablet 1 tab by mouth at bedtime as needed for pain 30 tablet 0  . latanoprost (XALATAN) 0.005 % ophthalmic solution Place 1 drop into both eyes daily.     . Multiple Vitamin (MULTIVITAMIN) capsule Take 1 capsule by mouth daily. Centrum Silver    . pentoxifylline (TRENTAL) 400 MG CR tablet Take 400 mg by mouth daily.    . polyethylene glycol (MIRALAX / GLYCOLAX) packet Take 17 g by mouth daily. 14 each 0   No current facility-administered medications for this visit.    Allergies:   Review of patient's allergies indicates no known allergies.   Social History:  The patient  reports that she has never smoked. She does not have any smokeless tobacco history on file. She reports that she does not  drink alcohol or use illicit drugs.   Family History:  The patient's family history includes Cancer in her mother; Diabetes in her sister.    ROS:  Please see the history of present illness.   All other systems are reviewed and negative.    PHYSICAL EXAM: VS:  BP 112/54 mmHg  Pulse 52  Ht  (1.499 m)  Wt 47.809 kg (105 lb 6.4 oz)  BMI 21.28 kg/m2 , BMI Body mass index is 21.28 kg/(m^2). GEN: elderly, in no acute distress HEENT: normal Neck: no JVD, carotid bruits, or masses Cardiac: RRR,no edema , diminished Pt/DP pulses bilaterally Respiratory:  clear to auscultation bilaterally, normal work of breathing GI: soft, nontender, nondistended, + BS MS: no deformity or atrophy Skin: warm and dry  Neuro:   Strength and sensation are intact Psych: euthymic mood, full affect  EKG:  EKG today reveals sinus rhythm  Recent Labs: 07/04/2014: ALT 14 07/16/2014: BUN 20; Creatinine 1.04; Potassium 3.9; Sodium 141 08/22/2014: Hemoglobin 9.9*; Platelets 395.0    Lipid Panel     Component Value Date/Time   CHOL 130 07/02/2014 0230   TRIG 114 07/02/2014 0230   HDL 40 07/02/2014 0230   CHOLHDL 3.3 07/02/2014 0230   VLDL 23 07/02/2014 0230   LDLCALC 67 07/02/2014 0230     Wt Readings from Last 3 Encounters:  08/28/14 47.809 kg (105 lb 6.4 oz)  08/22/14 47.174 kg (104 lb)  08/13/14 45.632 kg (100 lb 9.6 oz)     ASSESSMENT AND PLAN:  1.  S/p stroke Not felt to be embolic No indication for additional heart monitoring per my prior discussion with Dr Roda Shutters  2. L leg pain and "cramping" Very concerning to patient.  Has been present since hospitalization. I suspect that atorvastatin could be the cause. She should hold this medicine until follow-up with Dr Roda Shutters.  If her symptoms resolve then statin is likely the cause.  If her symptoms do not resolve off of atorvastatin, then perhaps this medicine can be continued when she sees Dr Roda Shutters.  Symptoms could also be due to PVD.  She is followed by vascular surgery.  I will ask them to consider noninvasive testing/ arterial dopplers to further assess.  3. Sinus bradycardia Chronic.  I have seen her several times for this No indication for pacing  4. HTN Stable No change required today   No further EP workup planned I will see as needed going forward.  Signed, Hillis Range, MD    [facility] [address] [place] [address] [phone] (office) [phone] (fax)

## 2014-09-03 ENCOUNTER — Telehealth (HOSPITAL_COMMUNITY): Payer: Self-pay | Admitting: Family

## 2014-09-03 ENCOUNTER — Telehealth: Payer: Self-pay | Admitting: Internal Medicine

## 2014-09-03 DIAGNOSIS — I10 Essential (primary) hypertension: Secondary | ICD-10-CM | POA: Diagnosis not present

## 2014-09-03 DIAGNOSIS — I69354 Hemiplegia and hemiparesis following cerebral infarction affecting left non-dominant side: Secondary | ICD-10-CM | POA: Diagnosis not present

## 2014-09-03 NOTE — Telephone Encounter (Signed)
Appointment scheduled.

## 2014-09-03 NOTE — Telephone Encounter (Signed)
-----   Message from Suzanne L Nickel, NP sent at 08/29/2014 12:39 PM EDT ----- °Regarding: add left LE arterial Duplex °Melinda, Kay, °Please add left LE arterial Duplex to pt's next visit which is already scheduled in December. Pt already has other testing scheduled, possibly ABI's and aortoiliacs. °Indication: intermittent claudication. °Thank you, °Suzanne ° °----- Message ----- °   From: Kelly F Lanier, RN °   Sent: 08/28/2014  11:44 AM °     To: James Allred, MD, Suzanne L Nickel, NP ° °Dr Allred saw patient today and he was wanting non-invasive lower extremity dopplers(lft leg pain) for her.  She has multiple appointments with your office and he is going to see prn going forward. Can you order for patient?  Thanks for your help. ° °Kelly Lanier, RN  ° °

## 2014-09-03 NOTE — Telephone Encounter (Signed)
She has called back regarding her medication HYDROcodone-acetaminophen (NORCO/VICODIN) 5-325 MG per tablet [[id number]][[id number]] . She is supposed to take every 6 hours and she is almost out. She will need a refill. Or she is wondering if maybe increase because of her pain.

## 2014-09-03 NOTE — Telephone Encounter (Signed)
Left detailed msg on Tracy [name]'s vmail letting her know dr Katrinka Blazingsmith will not refill hydrocodone & that pt can take Tylenol 650mg  TID.

## 2014-09-03 NOTE — Telephone Encounter (Signed)
Mary from [facility] called about patients pain. She has been with patient for a couple hours this morning and she can not stop moving and adjusting her arm for she is in a lot of pain. Patient has an appointment on Monday with Dr. Katrinka BlazingSmith but she is hoping he could see her a little sooner. Please advise. Mary's number is [phone]

## 2014-09-03 NOTE — Telephone Encounter (Signed)
Apologize but unable to work her in.  Consider a wait list and we will call her or we can refer to Dr. Patsy Lageropland.

## 2014-09-04 ENCOUNTER — Encounter: Payer: Self-pay | Admitting: Adult Health

## 2014-09-04 ENCOUNTER — Ambulatory Visit (INDEPENDENT_AMBULATORY_CARE_PROVIDER_SITE_OTHER): Payer: Medicare Other | Admitting: Adult Health

## 2014-09-04 VITALS — BP 138/82 | Temp 98.0°F | Ht 59.0 in | Wt 102.7 lb

## 2014-09-04 DIAGNOSIS — M25512 Pain in left shoulder: Secondary | ICD-10-CM

## 2014-09-04 MED ORDER — METHOCARBAMOL 500 MG PO TABS: 500.0000 mg | ORAL_TABLET | Freq: Three times a day (TID) | ORAL | Status: DC | PRN

## 2014-09-04 MED ORDER — TIZANIDINE HCL 4 MG PO TABS: 4.0000 mg | ORAL_TABLET | Freq: Four times a day (QID) | ORAL | Status: DC | PRN

## 2014-09-04 MED ORDER — MELOXICAM 15 MG PO TABS: 15.0000 mg | ORAL_TABLET | Freq: Every day | ORAL | Status: DC

## 2014-09-04 NOTE — Addendum Note (Signed)
Addended by: Nancy FetterNAFZIGER, Embree Brawley L on: 09/04/2014 04:16 PM   Modules accepted: Orders, Medications

## 2014-09-04 NOTE — Patient Instructions (Signed)
I have placed an order for an x ray of the shoulder. You can [place] this done at the [place].   Follow up with Terrilee FilesZach Smith on Monday.   Take the Mobic once a day for pain relief and take the muscle relaxer at night. If you are not feeling any better in the next 2-3 days please let me know.

## 2014-09-04 NOTE — Progress Notes (Signed)
   Subjective:    Patient ID: Tracy [name], female    DOB: [birth year], 81 y.o.   MRN: [id number]  HPI  Patient presents to the office today for left sided shoulder pain. She has been experiencing this dull/achy pain since being discharged form the hospital on April 14th s/p stroke on Easter Sunday ( she did not have any pain in the hospital). Over the last week and a half the pain has been getting worse. She has not been sleeping over the the past three nights due to the pain. Has been taking Norco which was prescribed to her prior, without relief. Denies any decrease in ROM. No numbness or tingling.   Review of Systems  Constitutional: Negative for activity change.  Musculoskeletal: Positive for myalgias, back pain, arthralgias and gait problem. Negative for joint swelling, neck pain and neck stiffness.  Psychiatric/Behavioral: Positive for sleep disturbance.  All other systems reviewed and are negative.      Objective:   Physical Exam  Constitutional: She is oriented to person, place, and time. She appears well-developed and well-nourished. No distress.  HENT:  Mouth/Throat: No oropharyngeal exudate.  Musculoskeletal: Normal range of motion. She exhibits tenderness. She exhibits no edema.  Able to raise arms above head without pain   Able to make arm circles ( forwards and backwards) without pain.   Able to do rowing motion without pain.   Able to shrug without pain.   Pain with manipulating shoulder back and forth. Slipping of humeral head on acromion process noted  Neurological: She is alert and oriented to person, place, and time.  Skin: Skin is warm and dry. No rash noted. She is not diaphoretic. No erythema. No pallor.  Psychiatric: She has a normal mood and affect. Her behavior is normal. Judgment and thought content normal.  Nursing note and vitals reviewed.      Assessment & Plan:  1. Pain in joint, shoulder region, left - Follow up with MD Terrilee FilesZach Smith on Monday.    - meloxicam (MOBIC) 15 MG tablet; Take 1 tablet (15 mg total) by mouth daily.  Dispense: 30 tablet; Refill: 0 - methocarbamol (ROBAXIN) 500 MG tablet; Take 1 tablet (500 mg total) by mouth every 8 (eight) hours as needed for muscle spasms.  Dispense: 10 tablet; Refill: 0 - DG Shoulder Left; Future - D/C Norco - Follow up if no relief in 2-3 days.

## 2014-09-04 NOTE — Progress Notes (Signed)
Pre visit review using our clinic review tool, if applicable. No additional management support is needed unless otherwise documented below in the visit note. 

## 2014-09-09 ENCOUNTER — Encounter: Payer: Self-pay | Admitting: Family Medicine

## 2014-09-09 ENCOUNTER — Ambulatory Visit (INDEPENDENT_AMBULATORY_CARE_PROVIDER_SITE_OTHER): Payer: Medicare Other | Admitting: Family Medicine

## 2014-09-09 ENCOUNTER — Other Ambulatory Visit (INDEPENDENT_AMBULATORY_CARE_PROVIDER_SITE_OTHER): Payer: Medicare Other

## 2014-09-09 VITALS — BP 118/62 | HR 50 | Wt 105.0 lb

## 2014-09-09 DIAGNOSIS — M25512 Pain in left shoulder: Secondary | ICD-10-CM | POA: Diagnosis not present

## 2014-09-09 DIAGNOSIS — M12812 Other specific arthropathies, not elsewhere classified, left shoulder: Secondary | ICD-10-CM

## 2014-09-09 DIAGNOSIS — M75102 Unspecified rotator cuff tear or rupture of left shoulder, not specified as traumatic: Secondary | ICD-10-CM

## 2014-09-09 DIAGNOSIS — M12512 Traumatic arthropathy, left shoulder: Secondary | ICD-10-CM | POA: Diagnosis not present

## 2014-09-09 NOTE — Progress Notes (Signed)
Tawana ScaleZach Honora Searson D.O. [facility] [address] [place], [place] [address] Phone: [phone] Subjective:     CC: Left shoulder pain follow up  BJY:NWGNFAOZHYHPI:Subjective Tracy FinerOphelia W [name] is a 81 y.o. female coming in with complaint of left shoulder pain. Patient does have a past medical history significant for right rotator cuff surgery in 2010.  Patient was last seen 2 months ago for left shoulder pain. Patient does have history of stroke as well. Patient was given an injection and seemed to be doing relatively well for quite some time and unfortunate the pain is starting to increase again. Patient states that this does affect her daily activities. She has not notice any more weakness.     Past medical history, social, surgical and family history all reviewed in electronic medical record.   Review of Systems: No headache, visual changes, nausea, vomiting, diarrhea, constipation, dizziness, abdominal pain, skin rash, fevers, chills, night sweats, weight loss, swollen lymph nodes, body aches, joint swelling, muscle aches, chest pain, shortness of breath, mood changes.   Objective Blood pressure 118/62, pulse 50, weight 105 lb (47.628 kg), SpO2 97 %.  General: No apparent distress alert and oriented x3 mood and affect normal, dressed appropriately. Frail HEENT: Pupils equal, extraocular movements intact  Respiratory: Patient's speak in full sentences and does not appear short of breath  Cardiovascular: No lower extremity edema, non tender, no erythema  Skin: Warm dry intact with no signs of infection or rash on extremities or on axial skeleton.  Abdomen: Soft nontender  Neuro: Cranial nerves II through XII are intact, neurovascularly intact in all extremities with 2+ DTRs and 2+ pulses.  Lymph: No lymphadenopathy of posterior or anterior cervical chain or axillae bilaterally.  Gait slow gait with some shoveling.  MSK:  Non tender with full range of motion and good stability and symmetric  strength and tone of shoulders, elbows, wrist, hip, knee and ankles bilaterally. Patient though is very frail and does have osteoarthritic changes of multiple joints. Shoulder: Left Inspection reveals significant atrophy of the musculature bilaterally Palpation show some generalized tenderness ROM is full in all planes passively but does have severe crepitus Rotator cuff strength 3/5. Positive signs of impingement Speeds and Yergason's tests normal. No labral pathology noted with negative Obrien's, negative clunk and good stability. Normal scapular function observed. painful arc and no drop arm sign.  MSK US performed of:  This study was ordered, performed, and interpreted by Terrilee FilesZach Destine Zirkle D.O.  Shoulder:   Supraspinatus:  Patient does have a near full-thickness tear of the supraspinatus but no significant retraction but lots of hypoechoic  Infraspinatus: Large full thickness tear with 1.1 cm it for traction.. Subscapularis: Degenerative intersubstance tearing but no retraction. Teres Minor:  Appears normal on long and transverse views. AC joint:  Capsule undistended, no geyser sign. Glenohumeral Joint: Severe arthritis. Glenoid Labrum:  Intact without visualized tears. Biceps Tendon:  Appears normal on long and transverse views, no fraying of tendon, tendon located in intertubercular groove, no subluxation with shoulder internal or external rotation. No increased power doppler signal.  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  Verbal informed consent obtained.  Time-out conducted.  Noted no overlying erythema, induration, or other signs of local  infection.  Skin prepped in a sterile fashion.  Local anesthesia: Topical Ethyl chloride.  With sterile technique and under real time ultrasound guidance:  Joint visualized.  23g 1  inch needle inserted posterior approach. Pictures taken for needle placement. Patient did have injection of 2 cc of 1% lidocaine, 2 cc of 0.5% Marcaine, and 1cc of Kenalog 40 mg/dL. Completed without difficulty  Pain immediately resolved suggesting accurate placement of the medication.  Advised to call if fevers/chills, erythema, induration, drainage, or persistent bleeding.  Images permanently stored and available for review in the ultrasound unit.  Impression: Technically successful ultrasound guided injection.     Impression and Recommendations:     This case required medical decision making of moderate complexity.

## 2014-09-09 NOTE — Assessment & Plan Note (Signed)
Patient given an injection today. We discussed icing regimen and home exercises. Patient will continue certain medications but we discussed avoiding using the anti-inflammatory on areolar basis secondary to patient's comorbidities. Patient even a topical anti-inflammatory to try instead which could be more beneficial and we discussed over-the-counter natural supple mentation is a could be helpful. Patient and will come back and see me again in 3-4 weeks for further evaluation and treatment.  Spent  25 minutes with patient face-to-face and had greater than 50% of counseling including as described above in assessment and plan.

## 2014-09-09 NOTE — Patient Instructions (Signed)
Good to see you Meloxicam if needed Tylenol 500mg  3 times daily Ice 20 minutes 2 times daily. Usually after activity and before bed. Vitamin D 2000 IU daily See me again every 3 months.

## 2014-09-12 DIAGNOSIS — I69354 Hemiplegia and hemiparesis following cerebral infarction affecting left non-dominant side: Secondary | ICD-10-CM | POA: Diagnosis not present

## 2014-09-12 DIAGNOSIS — I10 Essential (primary) hypertension: Secondary | ICD-10-CM | POA: Diagnosis not present

## 2014-09-16 ENCOUNTER — Other Ambulatory Visit: Payer: Self-pay | Admitting: Physical Medicine and Rehabilitation

## 2014-09-18 ENCOUNTER — Telehealth: Payer: Self-pay | Admitting: Family Medicine

## 2014-09-18 ENCOUNTER — Telehealth: Payer: Self-pay | Admitting: Internal Medicine

## 2014-09-18 DIAGNOSIS — I69354 Hemiplegia and hemiparesis following cerebral infarction affecting left non-dominant side: Secondary | ICD-10-CM | POA: Diagnosis not present

## 2014-09-18 DIAGNOSIS — I10 Essential (primary) hypertension: Secondary | ICD-10-CM | POA: Diagnosis not present

## 2014-09-18 MED ORDER — GABAPENTIN 300 MG PO CAPS: 300.0000 mg | ORAL_CAPSULE | Freq: Every day | ORAL | Status: DC

## 2014-09-18 MED ORDER — BACLOFEN 10 MG PO TABS: 5.0000 mg | ORAL_TABLET | Freq: Two times a day (BID) | ORAL | Status: DC | PRN

## 2014-09-18 NOTE — Telephone Encounter (Signed)
Patient Name: Tracy [name]  DOB: 10-17-1926    Initial Comment Caller states, [facility] with [facility], patient is uncomfortable, has nerve pain, unable to sleep, wants to know if she can be prescribed Robaxin or gabapentin, [phone]   Nurse Assessment  Nurse: Sherilyn Cooter, RN, Thurmond Butts Date/Time (Eastern Time): 09/18/2014 2:29:00 PM  Confirm and document reason for call. If symptomatic, describe symptoms. ---Caller states, [facility] with [facility], patient is uncomfortable, has nerve pain, unable to sleep, wants to know if she can be prescribed Robaxin or gabapentin, She states that she has had the Robaxin before and it did help. I called the backline and did a warm transfer to [facility] for further assistance.  Has the patient traveled out of the country within the last 30 days? ---Not Applicable  Does the patient require triage? ---No     Guidelines    Guideline Title Affirmed Question Affirmed Notes       Final Disposition User   Clinical Call Sherilyn Cooter, RN, Thurmond Butts

## 2014-09-18 NOTE — Telephone Encounter (Signed)
Refill done.  

## 2014-09-18 NOTE — Telephone Encounter (Signed)
I also talked with [facility] with [facility] regarding the note below. Can you please contact Corrie Dandy at [phone] after you looked through this.

## 2014-09-18 NOTE — Telephone Encounter (Signed)
Muscle relaxer I feel more comfortable with tanzidine or baclofen if patient wants Can do gabapentin 119m at night as well if she would like.

## 2014-09-24 ENCOUNTER — Telehealth: Payer: Self-pay | Admitting: *Deleted

## 2014-09-24 NOTE — Telephone Encounter (Signed)
[facility] - Client TELEPHONE ADVICE RECORD [facility] Medical Call Center Patient Name: Tracy [name] Gender: Female DOB: Nov 19, 1926 Age: 81 Y 5 M 22 D Return Phone Number: Address: City/State/Zip: [place] [facility] - Client Client Site [facility] Day Physician Oliver Barre Contact Type Call Call Type Triage / Clinical Relationship To Patient Provider Appointment Disposition EMR Appointment Not Necessary Info pasted into Epic Yes Return Phone Number Please choose phone number Chief Complaint Pain - Generalized Initial Comment Caller states, [facility], patient is uncomfortable, has nerve pain, unable to sleep, wants to know if she can be prescribed Robaxin or gabapentin, [phone] Nurse Assessment Nurse: Sherilyn Cooter, RN, Thurmond Butts Date/Time (Eastern Time): 09/18/2014 2:29:00 PM Confirm and document reason for call. If symptomatic, describe symptoms. ---Caller states, [facility] with [facility], patient is uncomfortable, has nerve pain, unable to sleep, wants to know if she can be prescribed Robaxin or gabapentin, She states that she has had the Robaxin before and it did help. I called the backline and did a warm transfer to [facility] for further assistance. Has the patient traveled out of the country within the last 30 days? ---Not Applicable Does the patient require triage? ---No Guidelines Guideline Title Affirmed Question Affirmed Notes Nurse Date/Time (Eastern Time) Disp. Time Lamount Cohen Time) Disposition Final User 09/18/2014 2:31:01 PM Clinical Call Sherilyn Cooter, RN, Thurmond Butts 09/18/2014 2:35:21 PM Clinical Call Yes Sherilyn Cooter, RN, Thurmond Butts After Care Instructions Given Call Event Type User Date / Time Description

## 2014-10-10 ENCOUNTER — Other Ambulatory Visit: Payer: Self-pay | Admitting: Physical Medicine and Rehabilitation

## 2014-10-10 ENCOUNTER — Other Ambulatory Visit: Payer: Self-pay | Admitting: Adult Health

## 2014-10-10 NOTE — Telephone Encounter (Signed)
Pls review note from Dr. Katrinka BlazingSmith on 6.15.2016.

## 2014-10-11 NOTE — Telephone Encounter (Signed)
Tracy [name] we noticed that Dr. Katrinka [name] was helping manage this problem.  Will you see if he would like her to continue this medication or try something else. Thanks!

## 2014-10-11 NOTE — Telephone Encounter (Signed)
I would rather her not take the Mobic, I just wanted to use it for a short time. I know she saw Dr. Katrinka BlazingSmith recently and he recommended muscle relaxer or Gabapentin 100mg .

## 2014-10-12 ENCOUNTER — Other Ambulatory Visit: Payer: Self-pay | Admitting: Family Medicine

## 2014-10-14 NOTE — Telephone Encounter (Signed)
Refill done.  

## 2014-10-14 NOTE — Telephone Encounter (Signed)
Refill denied 

## 2014-10-14 NOTE — Telephone Encounter (Signed)
Per patient PCP no anti-inflammatory but ok with the muscle relaxer.

## 2014-10-15 ENCOUNTER — Encounter: Payer: Self-pay | Admitting: Neurology

## 2014-10-15 ENCOUNTER — Other Ambulatory Visit: Payer: Self-pay | Admitting: *Deleted

## 2014-10-15 ENCOUNTER — Ambulatory Visit (INDEPENDENT_AMBULATORY_CARE_PROVIDER_SITE_OTHER): Payer: Medicare Other | Admitting: Neurology

## 2014-10-15 VITALS — BP 118/80 | HR 68 | Resp 14 | Ht 59.0 in | Wt 104.0 lb

## 2014-10-15 DIAGNOSIS — I63311 Cerebral infarction due to thrombosis of right middle cerebral artery: Secondary | ICD-10-CM

## 2014-10-15 DIAGNOSIS — M25512 Pain in left shoulder: Secondary | ICD-10-CM

## 2014-10-15 NOTE — Patient Instructions (Addendum)
-   continue ASA for stroke prevention - Follow up with your primary care physician for stroke risk factor modification. Recommend maintain blood pressure goal <130/80, diabetes with hemoglobin A1c goal below 6.5% and lipids with LDL cholesterol goal below 70 mg/dL.  - home exercise - follow up in 6 months.

## 2014-10-16 ENCOUNTER — Other Ambulatory Visit: Payer: Self-pay | Admitting: Internal Medicine

## 2014-10-16 ENCOUNTER — Other Ambulatory Visit: Payer: Self-pay | Admitting: Physical Medicine and Rehabilitation

## 2014-10-17 DIAGNOSIS — I63311 Cerebral infarction due to thrombosis of right middle cerebral artery: Secondary | ICD-10-CM | POA: Insufficient documentation

## 2014-10-17 NOTE — Progress Notes (Signed)
STROKE NEUROLOGY FOLLOW UP NOTE  NAME: Tracy [name] DOB: 1927-02-23  REASON FOR VISIT: stroke follow up HISTORY FROM: son and chart  Today we had the pleasure of seeing Tracy [name] in follow-up at our Neurology Clinic. Pt was accompanied by son.   History Summary [name] is a 81 y.o. female with history of history of hypertension, sick sinus syndrome, bradycardia, carotid stenosis, PVD, and "mini-stroke" years ago was admitted on 07/01/14 for left sided weakness and numbness. She has "afib" listed in her problem list but there was no documentation to support this. MRI showed small right lateral thalamus acute infarct. Other stroke work up all negative. With lack of risk factors and ? afib in the problem list, we recommended 30 day cardiac event monitoring but Dr. Johney FrameAllred looked through all pt's EKG and did not find evidence of afib and recommended no monitoring based on likely small vessel disease. She was discharged with continuing dural antiplatelet and low dose statin.   Interval History During the interval time, the patient has been doing fair. She can not tolerate lipitor due to muscle pain and lipitor was later discontinued. She also had one time bright red blood in stool, moderate amount, no recurrence but her plavix was discontinued due to that. She is continued on ASA 81 only. BP 118/80 today.    REVIEW OF SYSTEMS: Full 14 system review of systems performed and notable only for those listed below and in HPI above, all others are negative:  Constitutional:   Cardiovascular:  Ear/Nose/Throat:   Skin:  Eyes:   Respiratory:   Gastroitestinal:   Genitourinary:  Hematology/Lymphatic:   Endocrine:  Musculoskeletal:   Allergy/Immunology:   Neurological:   Psychiatric:  Sleep:   The following represents the patient's updated allergies and side effects list: No Known Allergies  The neurologically relevant items on the patient's problem list were reviewed on  today's visit.  Neurologic Examination  A problem focused neurological exam (12 or more points of the single system neurologic examination, vital signs counts as 1 point, cranial nerves count for 8 points) was performed.  Blood pressure 118/80, pulse 68, resp. rate 14, height 4\' 11"  (1.499 m), weight 104 lb (47.174 kg).  General - thin built, well developed, in no apparent distress.  Ophthalmologic - Fundi not visualized due to noncooperation.  Cardiovascular - Regular rate and rhythm.  Mental Status -  Level of arousal and orientation to time, place, and person were intact. Language including expression, naming, repetition, comprehension was assessed and found intact. Fund of Knowledge was assessed and was intact impaired.  Cranial Nerves II - XII - II - Visual field intact OU. III, IV, VI - Extraocular movements intact. V - Facial sensation intact bilaterally. VII - Facial movement intact bilaterally. VIII - Hearing & vestibular intact bilaterally. X - Palate elevates symmetrically. XI - Chin turning & shoulder shrug intact bilaterally. XII - Tongue protrusion intact.  Motor Strength - The patient's strength was normal in all extremities and pronator drift was absent.  Bulk was normal and fasciculations were absent.   Motor Tone - Muscle tone was assessed at the neck and appendages and was normal.  Reflexes - The patient's reflexes were 1+ in all extremities and she had no pathological reflexes.  Sensory - Light touch, temperature/pinprick were assessed and showed LLE decreased sensation.    Coordination - The patient had normal movements in the hands and feet with no ataxia or dysmetria.  Tremor was  absent.  Gait and Station - slow, small stride, cautious gait, walk without assistance.  Data reviewed: I personally reviewed the images and agree with the radiology interpretations.  Ct Head (brain) Wo Contrast 07/01/2014 1. No acute intracranial pathology. 2. Chronic  microvascular disease and cerebral atrophy. 3. Bilateral old parieto-occipital lobe infarcts and left cerebellar infarct.   Mr Brain Wo Contrast 07/01/2014 Severely motion degraded exam. 1 cm acute infarction in the right lateral thalamus. Extensive old ischemic changes elsewhere throughout the brain.   Dg Chest Port 1 View 07/01/2014 Multiple granulomas. No edema or consolidation. Mild cardiomegaly. Incomplete visualization of sclerosis in the right proximal humerus. This area may represent bone infarct. It may be prudent to consider dedicated right humerus radiographs to further evaluate this region.   Mr Maxine Glenn Head/brain Wo Cm 07/01/2014 Motion degraded MR angiogram. Major vessels all show flow except for the right vertebral artery.  2D Echocardiogram - LVEF 60-65%, moderately increased LV wall thickness, diastolic dysfunction, indeterminate LV filling pressure, mild MR, moderateRAE, normal LA size.  Carotid Doppler - Bilateral: 1-39% ICA stenosis. Vertebral artery flow is antegrade.  Component     Latest Ref Rng 07/02/2014  Cholesterol     0 - 200 mg/dL 161  Triglycerides     <150 mg/dL 096  HDL Cholesterol     >39 mg/dL 40  Total CHOL/HDL Ratio      3.3  VLDL     0 - 40 mg/dL 23  LDL (calc)     0 - 99 mg/dL 67  Hemoglobin E4V     4.8 - 5.6 % 6.3 (H)  Mean Plasma Glucose      134    Assessment: As you may recall, she is a 81 y.o. African American female with PMH of hypertension, sick sinus syndrome, bradycardia, carotid stenosis, PVD, and "mini-stroke" years ago was admitted on 07/01/14 for right thalamic infarct on MRI. Other stroke work up all negatvie/unremarkable including MRA, TTE, CUS, A1C and LDL. Cardiology considered no need for cardiac rhythm monitoring. Her lipitor stopped due to transient LGIB, lipitor took off due to intolerance. Her LDL is < 70 at goal.   Plan:  - continue ASA for stroke prevention - check BP at home - Follow up with your primary  care physician for stroke risk factor modification. Recommend maintain blood pressure goal <130/80, diabetes with hemoglobin A1c goal below 6.5% and lipids with LDL cholesterol goal below 70 mg/dL.  - home exercise - RTC in 6 months.   No orders of the defined types were placed in this encounter.    No orders of the defined types were placed in this encounter.    Patient Instructions  - continue ASA for stroke prevention - Follow up with your primary care physician for stroke risk factor modification. Recommend maintain blood pressure goal <130/80, diabetes with hemoglobin A1c goal below 6.5% and lipids with LDL cholesterol goal below 70 mg/dL.  - home exercise - follow up in 6 months.     Marvel Plan, MD PhD [facility] [address] [place], [place] [address] [phone]

## 2014-10-24 ENCOUNTER — Encounter: Payer: Self-pay | Admitting: Internal Medicine

## 2014-10-24 ENCOUNTER — Ambulatory Visit (INDEPENDENT_AMBULATORY_CARE_PROVIDER_SITE_OTHER): Payer: Medicare Other | Admitting: Internal Medicine

## 2014-10-24 VITALS — BP 184/90 | HR 52 | Temp 97.9°F | Ht 59.0 in | Wt 102.0 lb

## 2014-10-24 DIAGNOSIS — R531 Weakness: Secondary | ICD-10-CM

## 2014-10-24 DIAGNOSIS — I63311 Cerebral infarction due to thrombosis of right middle cerebral artery: Secondary | ICD-10-CM

## 2014-10-24 DIAGNOSIS — E119 Type 2 diabetes mellitus without complications: Secondary | ICD-10-CM | POA: Diagnosis not present

## 2014-10-24 DIAGNOSIS — I10 Essential (primary) hypertension: Secondary | ICD-10-CM | POA: Diagnosis not present

## 2014-10-24 MED ORDER — BENAZEPRIL HCL 40 MG PO TABS: 20.0000 mg | ORAL_TABLET | Freq: Every day | ORAL | Status: DC

## 2014-10-24 NOTE — Assessment & Plan Note (Signed)
stable overall by history and exam, recent data reviewed with pt, and pt to continue medical treatment as before,  to f/u any worsening symptoms or concerns  

## 2014-10-24 NOTE — Assessment & Plan Note (Signed)
Etiology unclear, exam stable, maybe improving, for lab eval as well including r/o uti

## 2014-10-24 NOTE — Progress Notes (Signed)
Pre visit review using our clinic review tool, if applicable. No additional management support is needed unless otherwise documented below in the visit note. 

## 2014-10-24 NOTE — Patient Instructions (Signed)
Please continue all other medications as before, including re-staring the benazepril  Please have the pharmacy call with any other refills you may need.  Please keep your appointments with your specialists as you may have planned  Please go to the LAB in the Basement (turn left off the elevator) for the tests to be done tomorrow  You will be contacted by phone if any changes need to be made immediately.  Otherwise, you will receive a letter about your results with an explanation, but please check with MyChart first.  Please remember to sign up for MyChart if you have not done so, as this will be important to you in the future with finding out test results, communicating by private email, and scheduling acute appointments online when needed.  Please return in 2 weeks, or sooner if needed

## 2014-10-24 NOTE — Progress Notes (Signed)
Subjective:    Patient ID: Tracy [name], female    DOB: Jul 12, 1926, 81 y.o.   MRN: [id number]  HPI  Here to f/u with concern about weakness but after making appt seemed to improve; kept appt today, now c/o 1-2 days HA, and BP quite high today.  Husband has been relying on pharmacy to keep the refills comiing, but now realizes not had any BP med since May 2016, sister took BP yesterday and was high as well.  Does not normally have HA's, BP yest 196/102.  Pt denies chest pain, increased sob or doe, wheezing, orthopnea, PND, increased LE swelling, palpitations, dizziness or syncope. Does c/o ongoing fatigue, but denies signficant daytime hypersomnolence.Pt denies new neurological symptoms such as facial or extremity weakness or numbness  Husband concerned that pt seems to have to have BM very soon after each time she eats, and seems to have some urinary freq/urgency and incontinence.  Did see neurology last wk Past Medical History  Diagnosis Date  . Hyperlipidemia   . HTN (hypertension)   . GERD (gastroesophageal reflux disease)   . CVA (cerebrovascular accident) 2002  . Osteopenia   . Hx of adenomatous colonic polyps 2007  . Diverticulosis of colon   . DM (diabetes mellitus), type 2   . PVD (peripheral vascular disease)     recently dx w/ carotid -left  int carotid >90% 50-60% rt int carotid 02/2010  . PVD (peripheral vascular disease)     lf  ABI 0.11 and rt 0.43  . CAROTID STENOSIS   . DIABETES MELLITUS, TYPE II   . DIVERTICULOSIS, COLON   . GERD   . HYPERLIPIDEMIA   . OSTEOPENIA   . Atrial fibrillation   . DVT (deep venous thrombosis)   . Sinus node dysfunction 2011  . Stroke   . Hypertension    Past Surgical History  Procedure Laterality Date  . Cns vascular stent per neurosurgeon    . Tubal ligation    . Right shoulder surgery  2010  . Cataract eye surgery    . Lazer eye surgery    . Iliac artery stent  2011    Bilateral CIA stent  . Carotid endarterectomy      reports that she has never smoked. She does not have any smokeless tobacco history on file. She reports that she does not drink alcohol or use illicit drugs. family history includes Cancer in her mother; Diabetes in her sister. No Known Allergies Current Outpatient Prescriptions on File Prior to Visit  Medication Sig Dispense Refill  . aspirin 81 MG tablet Take 81 mg by mouth daily.      . baclofen (LIORESAL) 10 MG tablet TAKE 0.5 TABLETS (5 MG TOTAL) BY MOUTH 2 (TWO) TIMES DAILY AS NEEDED FOR MUSCLE SPASMS. 30 tablet 0  . Calcium Carbonate (CALTRATE 600) 1500 MG TABS Take 1 tablet by mouth 2 (two) times daily.     . fenofibrate micronized (LOFIBRA) 134 MG capsule TAKE 1 CAPSULE (134 MG TOTAL) BY MOUTH DAILY. 90 capsule 3  . Multiple Vitamin (MULTIVITAMIN) capsule Take 1 capsule by mouth daily. Centrum Silver    . pentoxifylline (TRENTAL) 400 MG CR tablet Take 400 mg by mouth daily.    . benazepril (LOTENSIN) 40 MG tablet Take 0.5 tablets (20 mg total) by mouth daily. (Patient not taking: Reported on 10/24/2014) 30 tablet 0  . HYDROcodone-acetaminophen (NORCO/VICODIN) 5-325 MG per tablet 1 tab by mouth at bedtime as needed for pain (Patient not taking:  Reported on 10/24/2014) 30 tablet 0  . latanoprost (XALATAN) 0.005 % ophthalmic solution Place 1 drop into both eyes daily.     . polyethylene glycol (MIRALAX / GLYCOLAX) packet Take 17 g by mouth daily. (Patient not taking: Reported on 10/24/2014) 14 each 0   No current facility-administered medications on file prior to visit.    Review of Systems  Constitutional: Negative for unusual diaphoresis or night sweats HENT: Negative for ringing in ear or discharge Eyes: Negative for double vision or worsening visual disturbance.  Respiratory: Negative for choking and stridor.   Gastrointestinal: Negative for vomiting or other signifcant bowel change Genitourinary: Negative for hematuria or change in urine volume.  Musculoskeletal: Negative for other  MSK pain or swelling Skin: Negative for color change and worsening wound.  Neurological: Negative for tremors and numbness other than noted  Psychiatric/Behavioral: Negative for decreased concentration or agitation other than above       Objective:   Physical Exam BP 184/90 mmHg  Pulse 52  Temp(Src) 97.9 F (36.6 C) (Oral)  Ht 4\' 11"  (1.499 m)  Wt 102 lb (46.267 kg)  BMI 20.59 kg/m2  SpO2 97% VS noted,  Constitutional: Pt appears in no significant distress HENT: Head: NCAT.  Right Ear: External ear normal.  Left Ear: External ear normal.  Eyes: . Pupils are equal, round, and reactive to light. Conjunctivae and EOM are normal Neck: Normal range of motion. Neck supple.  Cardiovascular: Normal rate and regular rhythm.   Pulmonary/Chest: Effort normal and breath sounds without rales or wheezing.  Abd:  Soft, NT, ND, + BS Neurological: Pt is alert. Not confused , motor with 4-4+/5 LUE/LLE weakness Skin: Skin is warm. No rash, no LE edema Psychiatric: Pt behavior is normal. No agitation.     Assessment & Plan:

## 2014-10-24 NOTE — Assessment & Plan Note (Signed)
stable overall by history and exam, recent data reviewed with pt, and pt to continue medical treatment as before,  to f/u any worsening symptoms or concerns Lab Results  Component Value Date   HGBA1C 6.3 07/02/2014    

## 2014-10-24 NOTE — Assessment & Plan Note (Signed)
Uncontrolled, for re-start ACEI, f.u 2 wks

## 2014-10-25 ENCOUNTER — Encounter: Payer: Self-pay | Admitting: Internal Medicine

## 2014-10-25 ENCOUNTER — Other Ambulatory Visit (INDEPENDENT_AMBULATORY_CARE_PROVIDER_SITE_OTHER): Payer: Medicare Other

## 2014-10-25 DIAGNOSIS — I63311 Cerebral infarction due to thrombosis of right middle cerebral artery: Secondary | ICD-10-CM

## 2014-10-25 DIAGNOSIS — R531 Weakness: Secondary | ICD-10-CM | POA: Diagnosis not present

## 2014-10-25 DIAGNOSIS — I10 Essential (primary) hypertension: Secondary | ICD-10-CM | POA: Diagnosis not present

## 2014-10-25 DIAGNOSIS — E119 Type 2 diabetes mellitus without complications: Secondary | ICD-10-CM

## 2014-10-25 LAB — BASIC METABOLIC PANEL
BUN: 19 mg/dL (ref 6–23)
CHLORIDE: 103 meq/L (ref 96–112)
CO2: 31 mEq/L (ref 19–32)
Calcium: 9.6 mg/dL (ref 8.4–10.5)
Creatinine, Ser: 1.25 mg/dL — ABNORMAL HIGH (ref 0.40–1.20)
GFR: 52.31 mL/min — ABNORMAL LOW (ref >60.00)
GLUCOSE: 97 mg/dL (ref 70–99)
POTASSIUM: 3.9 meq/L (ref 3.5–5.1)
SODIUM: 142 meq/L (ref 135–145)

## 2014-10-25 LAB — URINALYSIS, ROUTINE W REFLEX MICROSCOPIC
Bilirubin Urine: NEGATIVE
Hgb urine dipstick: NEGATIVE
KETONES UR: NEGATIVE
LEUKOCYTES UA: NEGATIVE
Nitrite: NEGATIVE
PH: 8 (ref 5.0–8.0)
SPECIFIC GRAVITY, URINE: 1.015 (ref 1.000–1.030)
Total Protein, Urine: 30 — AB
Urine Glucose: NEGATIVE
Urobilinogen, UA: 0.2 (ref 0.0–1.0)

## 2014-10-25 LAB — LIPID PANEL
Cholesterol: 122 mg/dL (ref 0–200)
HDL: 48.4 mg/dL (ref >39.00)
LDL Cholesterol: 53 mg/dL (ref 0–99)
NonHDL: 73.6
Total CHOL/HDL Ratio: 3
Triglycerides: 105 mg/dL (ref 0.0–149.0)
VLDL: 21 mg/dL (ref 0.0–40.0)

## 2014-10-25 LAB — HEMOGLOBIN A1C: Hgb A1c MFr Bld: 5.6 % (ref 4.6–6.5)

## 2014-10-25 LAB — HEPATIC FUNCTION PANEL
ALBUMIN: 3.7 g/dL (ref 3.5–5.2)
ALK PHOS: 30 U/L — AB (ref 39–117)
ALT: 8 U/L (ref 0–35)
AST: 19 U/L (ref 0–37)
BILIRUBIN DIRECT: 0.1 mg/dL (ref 0.0–0.3)
BILIRUBIN TOTAL: 0.5 mg/dL (ref 0.2–1.2)
Total Protein: 6.6 g/dL (ref 6.0–8.3)

## 2014-10-25 LAB — CBC WITH DIFFERENTIAL/PLATELET
BASOS PCT: 0.5 % (ref 0.0–3.0)
Basophils Absolute: 0 10*3/uL (ref 0.0–0.1)
Eosinophils Absolute: 0.2 10*3/uL (ref 0.0–0.7)
Eosinophils Relative: 2.3 % (ref 0.0–5.0)
HCT: 34 % — ABNORMAL LOW (ref 36.0–46.0)
HEMOGLOBIN: 11.1 g/dL — AB (ref 12.0–15.0)
Lymphocytes Relative: 15.3 % (ref 12.0–46.0)
Lymphs Abs: 1.3 10*3/uL (ref 0.7–4.0)
MCHC: 32.6 g/dL (ref 30.0–36.0)
MCV: 87.4 fl (ref 78.0–100.0)
MONOS PCT: 7.3 % (ref 3.0–12.0)
Monocytes Absolute: 0.6 10*3/uL (ref 0.1–1.0)
NEUTROS ABS: 6.1 10*3/uL (ref 1.4–7.7)
Neutrophils Relative %: 74.6 % (ref 43.0–77.0)
PLATELETS: 297 10*3/uL (ref 150.0–400.0)
RBC: 3.89 Mil/uL (ref 3.87–5.11)
RDW: 14.5 % (ref 11.5–15.5)
WBC: 8.2 10*3/uL (ref 4.0–10.5)

## 2014-10-25 LAB — TSH: TSH: 2.53 u[IU]/mL (ref 0.35–4.50)

## 2014-10-28 DIAGNOSIS — I69354 Hemiplegia and hemiparesis following cerebral infarction affecting left non-dominant side: Secondary | ICD-10-CM | POA: Diagnosis not present

## 2014-10-28 DIAGNOSIS — I10 Essential (primary) hypertension: Secondary | ICD-10-CM | POA: Diagnosis not present

## 2014-11-15 ENCOUNTER — Ambulatory Visit (INDEPENDENT_AMBULATORY_CARE_PROVIDER_SITE_OTHER): Payer: Medicare Other | Admitting: Internal Medicine

## 2014-11-15 ENCOUNTER — Encounter: Payer: Self-pay | Admitting: Internal Medicine

## 2014-11-15 VITALS — BP 162/70 | HR 60 | Temp 98.5°F | Ht 59.0 in | Wt 102.0 lb

## 2014-11-15 DIAGNOSIS — E119 Type 2 diabetes mellitus without complications: Secondary | ICD-10-CM

## 2014-11-15 DIAGNOSIS — Z0000 Encounter for general adult medical examination without abnormal findings: Secondary | ICD-10-CM

## 2014-11-15 DIAGNOSIS — I10 Essential (primary) hypertension: Secondary | ICD-10-CM

## 2014-11-15 DIAGNOSIS — N3281 Overactive bladder: Secondary | ICD-10-CM | POA: Insufficient documentation

## 2014-11-15 MED ORDER — OXYBUTYNIN CHLORIDE ER 5 MG PO TB24: ORAL_TABLET | ORAL | Status: DC

## 2014-11-15 MED ORDER — AMLODIPINE BESYLATE 5 MG PO TABS: 5.0000 mg | ORAL_TABLET | Freq: Every day | ORAL | Status: DC

## 2014-11-15 NOTE — Progress Notes (Signed)
Subjective:    Patient ID: Tracy [name], female    DOB: 1926-09-14, 81 y.o.   MRN: [id number]  HPI  Here for wellness and f/u;  Overall doing ok;  Pt denies Chest pain, worsening SOB, DOE, wheezing, orthopnea, PND, worsening LE edema, palpitations, dizziness or syncope.  Pt denies neurological change such as new headache, facial or extremity weakness.  Pt denies polydipsia, polyuria, or low sugar symptoms. Pt states overall good compliance with treatment and medications, good tolerability, and has been trying to follow appropriate diet.  Pt denies worsening depressive symptoms, suicidal ideation or panic. No fever, night sweats, wt loss, loss of appetite, or other constitutional symptoms.  Pt states good ability with ADL's, has low fall risk, home safety reviewed and adequate, no other significant changes in hearing or vision, and only occasionally active with exercise.  BP has been persistently elev at home, c/o some intermittent mild pressure about the right eye, has appt with optho soon.  Also c/o distal LLE stiffness, exam with minimal findings today, told per neuro per son today that she simply needs more exercise. Still with signficant OAB symptoms, sometimes nocturia up to 5 times per night, not on diuretic or forcing fluids. Past Medical History  Diagnosis Date  . Hyperlipidemia   . HTN (hypertension)   . GERD (gastroesophageal reflux disease)   . CVA (cerebrovascular accident) 2002  . Osteopenia   . Hx of adenomatous colonic polyps 2007  . Diverticulosis of colon   . DM (diabetes mellitus), type 2   . PVD (peripheral vascular disease)     recently dx w/ carotid -left  int carotid >90% 50-60% rt int carotid 02/2010  . PVD (peripheral vascular disease)     lf  ABI 0.11 and rt 0.43  . CAROTID STENOSIS   . DIABETES MELLITUS, TYPE II   . DIVERTICULOSIS, COLON   . GERD   . HYPERLIPIDEMIA   . OSTEOPENIA   . Atrial fibrillation   . DVT (deep venous thrombosis)   . Sinus node  dysfunction 2011  . Stroke   . Hypertension    Past Surgical History  Procedure Laterality Date  . Cns vascular stent per neurosurgeon    . Tubal ligation    . Right shoulder surgery  2010  . Cataract eye surgery    . Lazer eye surgery    . Iliac artery stent  2011    Bilateral CIA stent  . Carotid endarterectomy      reports that she has never smoked. She does not have any smokeless tobacco history on file. She reports that she does not drink alcohol or use illicit drugs. family history includes Cancer in her mother; Diabetes in her sister. No Known Allergies Current Outpatient Prescriptions on File Prior to Visit  Medication Sig Dispense Refill  . aspirin 81 MG tablet Take 81 mg by mouth daily.      . benazepril (LOTENSIN) 40 MG tablet Take 0.5 tablets (20 mg total) by mouth daily. 45 tablet 3  . Calcium Carbonate (CALTRATE 600) 1500 MG TABS Take 1 tablet by mouth 2 (two) times daily.     . fenofibrate micronized (LOFIBRA) 134 MG capsule TAKE 1 CAPSULE (134 MG TOTAL) BY MOUTH DAILY. 90 capsule 3  . latanoprost (XALATAN) 0.005 % ophthalmic solution Place 1 drop into both eyes daily.     . Multiple Vitamin (MULTIVITAMIN) capsule Take 1 capsule by mouth daily. Centrum Silver    . pentoxifylline (TRENTAL) 400 MG CR  tablet Take 400 mg by mouth daily.    . baclofen (LIORESAL) 10 MG tablet TAKE 0.5 TABLETS (5 MG TOTAL) BY MOUTH 2 (TWO) TIMES DAILY AS NEEDED FOR MUSCLE SPASMS. (Patient not taking: Reported on 11/15/2014) 30 tablet 0  . HYDROcodone-acetaminophen (NORCO/VICODIN) 5-325 MG per tablet 1 tab by mouth at bedtime as needed for pain (Patient not taking: Reported on 10/24/2014) 30 tablet 0  . polyethylene glycol (MIRALAX / GLYCOLAX) packet Take 17 g by mouth daily. (Patient not taking: Reported on 10/24/2014) 14 each 0   No current facility-administered medications on file prior to visit.   Review of Systems Constitutional: Negative for increased diaphoresis, other activity, appetite  or siginficant weight change other than noted HENT: Negative for worsening hearing loss, ear pain, facial swelling, mouth sores and neck stiffness.   Eyes: Negative for other worsening pain, redness or visual disturbance.  Respiratory: Negative for shortness of breath and wheezing  Cardiovascular: Negative for chest pain and palpitations.  Gastrointestinal: Negative for diarrhea, blood in stool, abdominal distention or other pain Genitourinary: Negative for hematuria, flank pain or change in urine volume.  Musculoskeletal: Negative for myalgias or other joint complaints.  Skin: Negative for color change and wound or drainage.  Neurological: Negative for syncope and numbness. other than noted Hematological: Negative for adenopathy. or other swelling Psychiatric/Behavioral: Negative for hallucinations, SI, self-injury, decreased concentration or other worsening agitation.      Objective:   Physical Exam BP 162/70 mmHg  Pulse 60  Temp(Src) 98.5 F (36.9 C) (Oral)  Ht  (1.499 m)  Wt 102 lb (46.267 kg)  BMI 20.59 kg/m2  SpO2 98% VS noted, walks with walker Constitutional: Pt is oriented to person, place, and time. Appears well-developed and well-nourished, in no significant distress Head: Normocephalic and atraumatic.  Right Ear: External ear normal.  Left Ear: External ear normal.  Nose: Nose normal.  Mouth/Throat: Oropharynx is clear and moist.  Eyes: Conjunctivae and EOM are normal. Pupils are equal, round, and reactive to light.  Neck: Normal range of motion. Neck supple. No JVD present. No tracheal deviation present or significant neck LA or mass Cardiovascular: Normal rate, regular rhythm, normal heart sounds and intact distal pulses.   Pulmonary/Chest: Effort normal and breath sounds without rales or wheezing  Abdominal: Soft. Bowel sounds are normal. NT. No HSM  Musculoskeletal: Normal range of motion. Exhibits no edema.  Lymphadenopathy:  Has no cervical adenopathy.    Neurological: Pt is alert and oriented to person, place, and time. Pt has normal reflexes. No cranial nerve deficit. Motor grossly intact Skin: Skin is warm and dry. No rash noted.  Psychiatric:  Has normal mood and affect. Behavior is normal.     Assessment & Plan:

## 2014-11-15 NOTE — Assessment & Plan Note (Signed)
Mild uncontrolled, to add amlod 5 qd,  to f/u any worsening symptoms or concerns  

## 2014-11-15 NOTE — Assessment & Plan Note (Signed)
stable overall by history and exam, recent data reviewed with pt, and pt to continue medical treatment as before,  to f/u any worsening symptoms or concerns Lab Results  Component Value Date   HGBA1C 5.6 10/25/2014   Cont diet control

## 2014-11-15 NOTE — Assessment & Plan Note (Signed)
For trial ditropan CL 5 qd,  to f/u any worsening symptoms or concerns

## 2014-11-15 NOTE — Patient Instructions (Signed)
Please take all new medication as prescribed - the new blood pressure medication, and the one for the bladder  Please continue all other medications as before, and refills have been done if requested.  Please have the pharmacy call with any other refills you may need.  Please continue your efforts at being more active, low cholesterol diet, and weight control.  You are otherwise up to date with prevention measures today.  Please keep your appointments with your specialists as you may have planned  Please return in 6 months, or sooner if needed

## 2014-11-15 NOTE — Progress Notes (Signed)
Pre visit review using our clinic review tool, if applicable. No additional management support is needed unless otherwise documented below in the visit note. 

## 2014-11-27 ENCOUNTER — Telehealth: Payer: Self-pay

## 2014-11-27 DIAGNOSIS — M79605 Pain in left leg: Secondary | ICD-10-CM

## 2014-11-27 DIAGNOSIS — Z95828 Presence of other vascular implants and grafts: Secondary | ICD-10-CM

## 2014-11-27 DIAGNOSIS — R208 Other disturbances of skin sensation: Secondary | ICD-10-CM

## 2014-11-27 DIAGNOSIS — I739 Peripheral vascular disease, unspecified: Secondary | ICD-10-CM

## 2014-11-27 NOTE — Telephone Encounter (Signed)
Phone call from pt's. son.  Reported pt. Has been c/o increased problems with left leg over past 2 weeks.  Reported pt. c/o less feeling in left leg over past 2 weeks.  Reported pt. c/o pain in left leg intermittently @ rest and with walking.  Reported he has noticed her toes on left foot, are darker than they have been.  Requesting an appt. sooner than her December follow-up appt.  Advised will call back with appt. information.  Agrees with plan.

## 2014-11-28 ENCOUNTER — Encounter (HOSPITAL_COMMUNITY): Payer: Medicare Other

## 2014-11-28 ENCOUNTER — Ambulatory Visit: Payer: Medicare Other | Admitting: Family

## 2014-11-28 NOTE — Telephone Encounter (Signed)
Discussed pt. symptoms with Dr. Arbie Cookey.  Recommended to schedule for ABI's and office visit with NP today.  Contacted pt's son.  Appt. offered for 1:00 PM today.  Stated "there is no way we can come to the office today."   Advised that nurse will have office scheduler to call back with appt. option for tomorrow.  Ageed with plan.

## 2014-11-29 ENCOUNTER — Ambulatory Visit (INDEPENDENT_AMBULATORY_CARE_PROVIDER_SITE_OTHER): Payer: Medicare Other | Admitting: Family

## 2014-11-29 ENCOUNTER — Ambulatory Visit (HOSPITAL_COMMUNITY)
Admission: RE | Admit: 2014-11-29 | Discharge: 2014-11-29 | Disposition: A | Discharge status: Home / Self Care | Payer: Medicare Other | Source: Ambulatory Visit | Attending: Vascular Surgery | Admitting: Vascular Surgery

## 2014-11-29 ENCOUNTER — Encounter: Payer: Self-pay | Admitting: Family

## 2014-11-29 VITALS — BP 131/79 | HR 52 | Temp 96.9°F | Resp 14 | Ht 59.0 in | Wt 102.0 lb

## 2014-11-29 DIAGNOSIS — Z9889 Other specified postprocedural states: Secondary | ICD-10-CM | POA: Diagnosis not present

## 2014-11-29 DIAGNOSIS — I10 Essential (primary) hypertension: Secondary | ICD-10-CM | POA: Insufficient documentation

## 2014-11-29 DIAGNOSIS — I739 Peripheral vascular disease, unspecified: Secondary | ICD-10-CM

## 2014-11-29 DIAGNOSIS — M79605 Pain in left leg: Secondary | ICD-10-CM | POA: Diagnosis not present

## 2014-11-29 DIAGNOSIS — Z8673 Personal history of transient ischemic attack (TIA), and cerebral infarction without residual deficits: Secondary | ICD-10-CM | POA: Diagnosis not present

## 2014-11-29 DIAGNOSIS — I779 Disorder of arteries and arterioles, unspecified: Secondary | ICD-10-CM | POA: Diagnosis not present

## 2014-11-29 DIAGNOSIS — Z95828 Presence of other vascular implants and grafts: Secondary | ICD-10-CM

## 2014-11-29 DIAGNOSIS — R208 Other disturbances of skin sensation: Secondary | ICD-10-CM | POA: Diagnosis not present

## 2014-11-29 NOTE — Progress Notes (Signed)
[facility] [place] [facility]ORY [facility]   MRN : [id number]  History of Present Illness:   Tracy [name] is a 81 y.o. female patient of Dr. Hart [place] status who is post bilateral common iliac and left external iliac stents placed in 2011. She has a remote history of left CEA. She returns today with c/o left toes turned dark about 3 weeks ago, but look better today per son. Her left leg has felt "dead" since stroke on 07-16-14.  Patient has Positive history of TIA or stroke symptom about 2004 as manifested by repeating the same several phrases which she didr ealize, lasted about 3-4 days. She had another stroke on 07/16/14 as manifested by left leg and left hand numbness, and left leg weakness; she fell once trying to walk. She was taken to [facility] on July 16, 2014, evaluated, states she was told sh had a stroke. Son states that pt has had left thigh and calf pain since the stroke.  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 She presented to ED 2014/07/16 with complaints of progressive left sided numbness left sided numbness and weakness with subsequent fall and difficulty walking. Patient was found to be bradycardic with HR 40-50s and MRI/MRA brain done revealing 1 cm acute infarction in the right lateral thalamus, extensive old ischemic changes throughout the brain and no antegrade flow in R-VA. Pelvic films done due to complaints of left hip pain and revealed mild bilateral hip OA. Dr. Roda Shutters felt that patient had thrombotic stroke due to SVD and recommended compliance with antiplatelet medications (ASA, plavix, trental). PPM not needed as patient asymptomatic and Loop recorder not needed per consult by cardiology. Therapy evaluations done and CIR was recommended by MD and Rehab team  Her neurologist is Dr. Roda Shutters.   She has atrial fib.  Pt Diabetic: No, patient denies having even though it is listed in her PMHX. Pt smoker:  former smoker, quit 25-30 years ago  Pt meds include: Statin :No ASA: Yes Other anticoagulants/antiplatelets:   Trental, pt indicates one of her blood thinners was stopped due to blood in her stool, it appears to be Plavix    Current Outpatient Prescriptions  Medication Sig Dispense Refill  . amLODipine (NORVASC) 5 MG tablet Take 1 tablet (5 mg total) by mouth daily. 90 tablet 3  . aspirin 81 MG tablet Take 81 mg by mouth daily.      . baclofen (LIORESAL) 10 MG tablet TAKE 0.5 TABLETS (5 MG TOTAL) BY MOUTH 2 (TWO) TIMES DAILY AS NEEDED FOR MUSCLE SPASMS. 30 tablet 0  . benazepril (LOTENSIN) 40 MG tablet Take 0.5 tablets (20 mg total) by mouth daily. 45 tablet 3  . Calcium Carbonate (CALTRATE 600) 1500 MG TABS Take 1 tablet by mouth 2 (two) times daily.     . fenofibrate micronized (LOFIBRA) 134 MG capsule TAKE 1 CAPSULE (134 MG TOTAL) BY MOUTH DAILY. 90 capsule 3  . Multiple Vitamin (MULTIVITAMIN) capsule Take 1 capsule by mouth daily. Centrum Silver    . oxybutynin (DITROPAN XL) 5 MG 24 hr tablet 1 tab by mouth daily 90 tablet 3  . pentoxifylline (TRENTAL) 400 MG CR tablet Take 400 mg by mouth daily.    Marland Kitchen HYDROcodone-acetaminophen (NORCO/VICODIN) 5-325 MG per tablet 1 tab by mouth at bedtime as needed for pain (Patient not taking: Reported on 10/24/2014) 30 tablet 0  . latanoprost (XALATAN) 0.005 % ophthalmic solution Place 1 drop into both eyes daily.     Marland Kitchen  polyethylene glycol (MIRALAX / GLYCOLAX) packet Take 17 g by mouth daily. (Patient not taking: Reported on 11/29/2014) 14 each 0   No current facility-administered medications for this visit.    Past Medical History  Diagnosis Date  . Hyperlipidemia   . HTN (hypertension)   . GERD (gastroesophageal reflux disease)   . Osteopenia   . Hx of adenomatous colonic polyps 2007  . Diverticulosis of colon   . DM (diabetes mellitus), type 2   . PVD (peripheral vascular disease)     recently dx w/ carotid -left  int carotid >90% 50-60%  rt int carotid 02/2010  . PVD (peripheral vascular disease)     lf  ABI 0.11 and rt 0.43  . CAROTID STENOSIS   . DIABETES MELLITUS, TYPE II   . DIVERTICULOSIS, COLON   . GERD   . HYPERLIPIDEMIA   . OSTEOPENIA   . Atrial fibrillation   . DVT (deep venous thrombosis)   . Sinus node dysfunction 2011  . Hypertension   . CVA (cerebrovascular accident) 2002  . Stroke July 01, 2014    Affected Left side    Social History Social History  Substance Use Topics  . Smoking status: Never Smoker   . Smokeless tobacco: Never Used  . Alcohol Use: No    Family History Family History  Problem Relation Age of Onset  . Diabetes Sister     Amputation  . Cancer Mother     colon    Surgical History Past Surgical History  Procedure Laterality Date  . Cns vascular stent per neurosurgeon    . Tubal ligation    . Right shoulder surgery  2010  . Cataract eye surgery    . Lazer eye surgery    . Iliac artery stent  2011    Bilateral CIA stent  . Carotid endarterectomy      No Known Allergies  Current Outpatient Prescriptions  Medication Sig Dispense Refill  . amLODipine (NORVASC) 5 MG tablet Take 1 tablet (5 mg total) by mouth daily. 90 tablet 3  . aspirin 81 MG tablet Take 81 mg by mouth daily.      . baclofen (LIORESAL) 10 MG tablet TAKE 0.5 TABLETS (5 MG TOTAL) BY MOUTH 2 (TWO) TIMES DAILY AS NEEDED FOR MUSCLE SPASMS. 30 tablet 0  . benazepril (LOTENSIN) 40 MG tablet Take 0.5 tablets (20 mg total) by mouth daily. 45 tablet 3  . Calcium Carbonate (CALTRATE 600) 1500 MG TABS Take 1 tablet by mouth 2 (two) times daily.     . fenofibrate micronized (LOFIBRA) 134 MG capsule TAKE 1 CAPSULE (134 MG TOTAL) BY MOUTH DAILY. 90 capsule 3  . Multiple Vitamin (MULTIVITAMIN) capsule Take 1 capsule by mouth daily. Centrum Silver    . oxybutynin (DITROPAN XL) 5 MG 24 hr tablet 1 tab by mouth daily 90 tablet 3  . pentoxifylline (TRENTAL) 400 MG CR tablet Take 400 mg by mouth daily.    Marland Kitchen  HYDROcodone-acetaminophen (NORCO/VICODIN) 5-325 MG per tablet 1 tab by mouth at bedtime as needed for pain (Patient not taking: Reported on 10/24/2014) 30 tablet 0  . latanoprost (XALATAN) 0.005 % ophthalmic solution Place 1 drop into both eyes daily.     . polyethylene glycol (MIRALAX / GLYCOLAX) packet Take 17 g by mouth daily. (Patient not taking: Reported on 11/29/2014) 14 each 0   No current facility-administered medications for this visit.     REVIEW OF SYSTEMS: See HPI for pertinent positives and negatives.  Physical  Examination Filed Vitals:   11/29/14 0926  BP: 131/79  Pulse: 52  Temp: 96.9 F (36.1 C)  TempSrc: Oral  Resp: 14  Height:  (1.499 m)  Weight: 102 lb (46.267 kg)  SpO2: 100%   Body mass index is 20.59 kg/(m^2).  General: WDWN in NAD Gait: Slow and deliberate, using a rolling walker HENT: WNL Eyes: Both pupils are irregularly shaped. Pulmonary: normal non-labored breathing , without Rales, rhonchi, wheezing Cardiac: Regular rhythm, with outdetected murmur. No carotid bruits Abdomen: soft, NT, no palpable masses Skin: no rashes, no ulcers, no cellulitis.   VASCULAR EXAM: Extremities without ischemic changes  without Gangrene; without open wounds.  Left toes 2-4 are slightly dark at dorsal aspect, not on plantar surface.       LE Pulses Left RIGHT   FEMORAL 1+palpable not palpable    POPLITEAL Not palpable not palpable   POSTERIOR TIBIAL Not palpable not palpable    DORSALIS PEDIS  ANTERIOR TIBIAL Not palpable not palpable      Musculoskeletal: mild generalized muscle wasting/atrophy; no edema Neurologic: A&O X 3; Appropriate Affect ;  MOTOR FUNCTION: 5/5 Symmetric, CN 2-12 intact Speech is fluent/normal   Significant  Diagnostic Studies: Head CT of 02/23/13:  Remote moderate to large size occipital lobe infarcts with  encephalomalacia more notable on the left. Remote appearing moderate  size left cerebellar infarct.  Small infarcts of the thalamus bilaterally, caudate head bilaterally  and right paracentral pontine region of questionable age.  Prior MR made reference to bilateral occipital lobe, bilateral  caudate head and thalamic infarcts.  Small vessel disease type changes.  No intracranial enhancing lesion.  No intracranial hemorrhage.  Global atrophy without hydrocephalus.  Prominent vascular calcifications with narrowing of the vertebral  arteries and carotid arteries.           Non-Invasive Vascular Imaging (11/29/2014):  ABI (Date: 11/29/2014)  R: 0.48 (0.49, 03/26/14), DP: monophasic, PT: not detected, TBI: 0.21 (dampened)             L: 0.48 (0.50), DP: monophasic, PT: monophasic, TBI: 0.30 (dampened)   ASSESSMENT:  Neenah Canter Tarbell is a 81 y.o. female who is status post bilateral common iliac and left external iliac stents placed in 2011. She has a remote history of left CEA. She has a history of stroke in 2004 with no residual neurological deficit. She had another stroke on 07/01/14 as manifested by left leg and left hand numbness, and left leg weakness; she fell once trying to walk. She was taken to [facility] on 07/01/14, evaluated, states she was told she had a stroke. Son states that pt has had left thigh and calf pain since the stroke.  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 Her ABI's today are unchanged from 8 months ago; her TBI's are actually detectable today whereas they were dampened eight months ago. She has no ulcers, no gangrene. The left left and foot symptoms seem to have been present since she had her stroke on 07/01/14.    PLAN:   Based on today's exam and non-invasive vascular lab  results, the patient will follow up in December 2016 as scheduled with the following tests: Carotid Duplex, ABI's, and iliac artery stent evaluation. She and her son know that pt should return sooner for concerns or problems.  I discussed in depth with the patient the nature of atherosclerosis, and emphasized the importance of maximal medical management including strict control of  blood pressure, blood glucose, and lipid levels, obtaining regular exercise, and cessation of smoking.  The patient is aware that without maximal medical management the underlying atherosclerotic disease process will progress, limiting the benefit of any interventions.  The patient was given information about stroke prevention and what symptoms should prompt the patient to seek immediate medical care.  The patient was given information about PAD including signs, symptoms, treatment, what symptoms should prompt the patient to seek immediate medical care, and risk reduction measures to take. Thank you for allowing [place] to participate in this patient's care.  Charisse March, RN, MSN, FNP-C [facility] Office: [phone]  Clinic MD: Imogene Burn  11/29/2014 9:40 AM

## 2014-12-05 ENCOUNTER — Telehealth: Payer: Self-pay | Admitting: *Deleted

## 2014-12-05 MED ORDER — BENAZEPRIL HCL 40 MG PO TABS: 20.0000 mg | ORAL_TABLET | Freq: Every day | ORAL | Status: DC

## 2014-12-05 NOTE — Telephone Encounter (Signed)
Receive call from pt son he states they are trying to get a refill on mom BP med pharmacist told him to contact md because he had restrictions on med. Inform son will contact pharmacist and give him a call back once we find out the hold up. Called [facility] spoke with Revonda Standard she stated that pt pick up 90 day supply on Benzapril  10/24/14 which insurance will not cover again until October. Called pt son no answer LMOM RTC...Raechel Chute

## 2014-12-05 NOTE — Telephone Encounter (Signed)
Pt son called back and they don't know what they need to do. She has been taking a full pill all this time and she's supposed to take 1/2. She has 6 pills left which will last her 2 weeks and they are needing clarification Can you please call him back.

## 2014-12-05 NOTE — Telephone Encounter (Signed)
Notified son with md response...Raechel Chute

## 2014-12-05 NOTE — Telephone Encounter (Signed)
New rx sent , ok for early refill, to take med as prescribed

## 2014-12-10 ENCOUNTER — Telehealth: Payer: Self-pay | Admitting: Internal Medicine

## 2014-12-10 MED ORDER — BENAZEPRIL HCL 40 MG PO TABS: 20.0000 mg | ORAL_TABLET | Freq: Every day | ORAL | Status: DC

## 2014-12-10 NOTE — Telephone Encounter (Signed)
pts son called and states the pharmacy does not have the prescription for benazepril (LOTENSIN) 40 MG tablet [[id number] Can you please resend

## 2014-12-11 ENCOUNTER — Other Ambulatory Visit: Payer: Self-pay | Admitting: Family Medicine

## 2014-12-11 ENCOUNTER — Ambulatory Visit: Payer: Medicare Other | Admitting: Family Medicine

## 2014-12-11 NOTE — Telephone Encounter (Signed)
Refill denied.  Med was discontinued by dr Katrinka Blazing.

## 2014-12-17 ENCOUNTER — Telehealth: Payer: Self-pay | Admitting: Internal Medicine

## 2014-12-17 NOTE — Telephone Encounter (Signed)
Pt advised via VM letter placed in the mail

## 2014-12-17 NOTE — Telephone Encounter (Signed)
Patient's son said the patient cannot walk to the mailbox to get the mail.  She almost fell a couple of days ago.  So he put the mailbox at the house, so she wouldn't have to walk to the street to get the mail, but the post office is asking for a letter from the doctor saying she is not able to walk to the mailbox on the street. So he is requesting a letter.

## 2014-12-17 NOTE — Telephone Encounter (Signed)
Letter done

## 2014-12-23 ENCOUNTER — Encounter: Payer: Self-pay | Admitting: Gastroenterology

## 2015-01-28 ENCOUNTER — Encounter: Payer: Self-pay | Admitting: Family Medicine

## 2015-01-28 ENCOUNTER — Ambulatory Visit (INDEPENDENT_AMBULATORY_CARE_PROVIDER_SITE_OTHER): Payer: Medicare Other | Admitting: Family Medicine

## 2015-01-28 ENCOUNTER — Other Ambulatory Visit (INDEPENDENT_AMBULATORY_CARE_PROVIDER_SITE_OTHER): Payer: Medicare Other

## 2015-01-28 VITALS — BP 122/62 | HR 58 | Ht 59.0 in | Wt 103.0 lb

## 2015-01-28 DIAGNOSIS — M25512 Pain in left shoulder: Secondary | ICD-10-CM | POA: Diagnosis not present

## 2015-01-28 NOTE — Patient Instructions (Signed)
Good to see you Happy holidays!!! Ice is your friend Keep trucking along Try pennsaid topically if needed See me again in 3 months if needed

## 2015-01-28 NOTE — Progress Notes (Signed)
Tawana ScaleZach Smith D.O. [facility] [address] [place], [place] [address] Phone: [phone] Subjective:     CC: Left shoulder pain follow up  BJY:NWGNFAOZHYHPI:Subjective Horton FinerOphelia W Muff is a 81 y.o. female coming in with complaint of left shoulder pain. Patient was seen by me in greater than 5 months ago and was given an injection. Patient was doing very well until the last 2 weeks. We discussed icing regimen and home exercises. We discussed which activities to do an which was potentially avoid. Patient will continue with the conservative therapy and we can repeat injections every 3 months necessary. We discussed the possibility of formal physical therapy which patient declined. Patient come back and see me again in 3 months as suggested previously.     Past medical history, social, surgical and family history all reviewed in electronic medical record.   Review of Systems: No headache, visual changes, nausea, vomiting, diarrhea, constipation, dizziness, abdominal pain, skin rash, fevers, chills, night sweats, weight loss, swollen lymph nodes, body aches, joint swelling, muscle aches, chest pain, shortness of breath, mood changes.   Objective Blood pressure 122/62, pulse 58, height 4\' 11"  (1.499 m), weight 103 lb (46.72 kg), SpO2 94 %.  General: No apparent distress alert and oriented x3 mood and affect normal, dressed appropriately. Frail HEENT: Pupils equal, extraocular movements intact  Respiratory: Patient's speak in full sentences and does not appear short of breath  Cardiovascular: No lower extremity edema, non tender, no erythema  Skin: Warm dry intact with no signs of infection or rash on extremities or on axial skeleton.  Abdomen: Soft nontender  Neuro: Cranial nerves II through XII are intact, neurovascularly intact in all extremities with 2+ DTRs and 2+ pulses.  Lymph: No lymphadenopathy of posterior or anterior cervical chain or axillae bilaterally.  Gait slow gait with some  shoveling.  MSK:  Non tender with full range of motion and good stability and symmetric strength and tone of shoulders, elbows, wrist, hip, knee and ankles bilaterally. Patient though is very frail and does have osteoarthritic changes of multiple joints. Shoulder: Left Inspection reveals significant atrophy of the musculature bilaterally Generalized tenderness of the shoulder region mostly posterior ROM not lacking the last 10 of forward flexion passively but does have severe crepitus Rotator cuff strength 3/5. Positive signs of impingement Speeds and Yergason's tests normal. No labral pathology noted with negative Obrien's, negative clunk and good stability. Normal scapular function observed. painful arc and no drop arm sign.    Procedure: Real-time Ultrasound Guided Injection of left glenohumeral joint Device: GE Logiq E  Ultrasound guided injection is preferred based studies that show increased duration, increased effect, greater accuracy, decreased procedural pain, increased response rate with ultrasound guided versus blind injection.  Verbal informed consent obtained.  Time-out conducted.  Noted no overlying erythema, induration, or other signs of local infection.  Skin prepped in a sterile fashion.  Local anesthesia: Topical Ethyl chloride.  With sterile technique and under real time ultrasound guidance:  Joint visualized.  23g 1  inch needle inserted posterior approach. Pictures taken for needle placement. Patient did have injection of 2 cc of 1% lidocaine, 2 cc of 0.5% Marcaine, and 1cc of Kenalog 40 mg/dL. Completed without difficulty  Pain immediately resolved suggesting accurate placement of the medication.  Advised to call if fevers/chills, erythema, induration, drainage, or persistent bleeding.  Images permanently stored and available for review in the ultrasound unit.  Impression: Technically successful ultrasound guided injection.     Impression  and Recommendations:       This case required medical decision making of moderate complexity.

## 2015-01-28 NOTE — Progress Notes (Signed)
Pre visit review using our clinic review tool, if applicable. No additional management support is needed unless otherwise documented below in the visit note. 

## 2015-03-21 DIAGNOSIS — H35352 Cystoid macular degeneration, left eye: Secondary | ICD-10-CM | POA: Diagnosis not present

## 2015-03-21 DIAGNOSIS — H3530 Unspecified macular degeneration: Secondary | ICD-10-CM | POA: Diagnosis not present

## 2015-03-26 ENCOUNTER — Encounter: Payer: Self-pay | Admitting: Family

## 2015-04-01 ENCOUNTER — Encounter: Payer: Self-pay | Admitting: Family

## 2015-04-01 ENCOUNTER — Ambulatory Visit (HOSPITAL_COMMUNITY)
Admission: RE | Admit: 2015-04-01 | Discharge: 2015-04-01 | Disposition: A | Discharge status: Home / Self Care | Payer: Medicare Other | Source: Ambulatory Visit | Attending: Family | Admitting: Family

## 2015-04-01 ENCOUNTER — Ambulatory Visit (INDEPENDENT_AMBULATORY_CARE_PROVIDER_SITE_OTHER)
Admission: RE | Admit: 2015-04-01 | Discharge: 2015-04-01 | Disposition: A | Discharge status: Home / Self Care | Payer: Medicare Other | Source: Ambulatory Visit | Attending: Family | Admitting: Family

## 2015-04-01 ENCOUNTER — Ambulatory Visit (INDEPENDENT_AMBULATORY_CARE_PROVIDER_SITE_OTHER): Payer: Medicare Other | Admitting: Family

## 2015-04-01 VITALS — BP 150/70 | HR 60 | Temp 97.2°F | Resp 16 | Ht 62.0 in | Wt 103.0 lb

## 2015-04-01 DIAGNOSIS — E119 Type 2 diabetes mellitus without complications: Secondary | ICD-10-CM | POA: Insufficient documentation

## 2015-04-01 DIAGNOSIS — M79605 Pain in left leg: Secondary | ICD-10-CM

## 2015-04-01 DIAGNOSIS — I6521 Occlusion and stenosis of right carotid artery: Secondary | ICD-10-CM

## 2015-04-01 DIAGNOSIS — I739 Peripheral vascular disease, unspecified: Secondary | ICD-10-CM

## 2015-04-01 DIAGNOSIS — Z95828 Presence of other vascular implants and grafts: Secondary | ICD-10-CM

## 2015-04-01 DIAGNOSIS — E785 Hyperlipidemia, unspecified: Secondary | ICD-10-CM | POA: Insufficient documentation

## 2015-04-01 DIAGNOSIS — I771 Stricture of artery: Secondary | ICD-10-CM | POA: Diagnosis not present

## 2015-04-01 DIAGNOSIS — I708 Atherosclerosis of other arteries: Secondary | ICD-10-CM | POA: Insufficient documentation

## 2015-04-01 DIAGNOSIS — Z9889 Other specified postprocedural states: Secondary | ICD-10-CM

## 2015-04-01 DIAGNOSIS — I10 Essential (primary) hypertension: Secondary | ICD-10-CM | POA: Insufficient documentation

## 2015-04-01 DIAGNOSIS — I779 Disorder of arteries and arterioles, unspecified: Secondary | ICD-10-CM

## 2015-04-01 DIAGNOSIS — R208 Other disturbances of skin sensation: Secondary | ICD-10-CM

## 2015-04-01 DIAGNOSIS — I6523 Occlusion and stenosis of bilateral carotid arteries: Secondary | ICD-10-CM

## 2015-04-01 DIAGNOSIS — Z48812 Encounter for surgical aftercare following surgery on the circulatory system: Secondary | ICD-10-CM

## 2015-04-01 NOTE — Patient Instructions (Signed)
Stroke Prevention Some medical conditions and behaviors are associated with an increased chance of having a stroke. You may prevent a stroke by making healthy choices and managing medical conditions. HOW CAN I REDUCE MY RISK OF HAVING A STROKE?   Stay physically active. Get at least 30 minutes of activity on most or all days.  Do not smoke. It may also be helpful to avoid exposure to secondhand smoke.  Limit alcohol use. Moderate alcohol use is considered to be:  No more than 2 drinks per day for men.  No more than 1 drink per day for nonpregnant women.  Eat healthy foods. This involves:  Eating 5 or more servings of fruits and vegetables a day.  Making dietary changes that address high blood pressure (hypertension), high cholesterol, diabetes, or obesity.  Manage your cholesterol levels.  Making food choices that are high in fiber and low in saturated fat, trans fat, and cholesterol may control cholesterol levels.  Take any prescribed medicines to control cholesterol as directed by your health care provider.  Manage your diabetes.  Controlling your carbohydrate and sugar intake is recommended to manage diabetes.  Take any prescribed medicines to control diabetes as directed by your health care provider.  Control your hypertension.  Making food choices that are low in salt (sodium), saturated fat, trans fat, and cholesterol is recommended to manage hypertension.  Ask your health care provider if you need treatment to lower your blood pressure. Take any prescribed medicines to control hypertension as directed by your health care provider.  If you are 18-39 years of age, have your blood pressure checked every 3-5 years. If you are 40 years of age or older, have your blood pressure checked every year.  Maintain a healthy weight.  Reducing calorie intake and making food choices that are low in sodium, saturated fat, trans fat, and cholesterol are recommended to manage  weight.  Stop drug abuse.  Avoid taking birth control pills.  Talk to your health care provider about the risks of taking birth control pills if you are over 35 years old, smoke, get migraines, or have ever had a blood clot.  Get evaluated for sleep disorders (sleep apnea).  Talk to your health care provider about getting a sleep evaluation if you snore a lot or have excessive sleepiness.  Take medicines only as directed by your health care provider.  For some people, aspirin or blood thinners (anticoagulants) are helpful in reducing the risk of forming abnormal blood clots that can lead to stroke. If you have the irregular heart rhythm of atrial fibrillation, you should be on a blood thinner unless there is a good reason you cannot take them.  Understand all your medicine instructions.  Make sure that other conditions (such as anemia or atherosclerosis) are addressed. SEEK IMMEDIATE MEDICAL CARE IF:   You have sudden weakness or numbness of the face, arm, or leg, especially on one side of the body.  Your face or eyelid droops to one side.  You have sudden confusion.  You have trouble speaking (aphasia) or understanding.  You have sudden trouble seeing in one or both eyes.  You have sudden trouble walking.  You have dizziness.  You have a loss of balance or coordination.  You have a sudden, severe headache with no known cause.  You have new chest pain or an irregular heartbeat. Any of these symptoms may represent a serious problem that is an emergency. Do not wait to see if the symptoms will   go away. Get medical help at once. Call your local emergency services (911 in [place] Do not drive yourself to the hospital.   This information is not intended to replace advice given to you by your health care provider. Make sure you discuss any questions you have with your health care provider.   Document Released: 04/29/2004 Document Revised: 04/12/2014 Document Reviewed:  09/22/2012 Elsevier Interactive Patient Education 2016 [employer]    Peripheral Vascular Disease Peripheral vascular disease (PVD) is a disease of the blood vessels that are not part of your heart and brain. A simple term for PVD is poor circulation. In most cases, PVD narrows the blood vessels that carry blood from your heart to the rest of your body. This can result in a decreased supply of blood to your arms, legs, and internal organs, like your stomach or kidneys. However, it most often affects a person's lower legs and feet. There are two types of PVD.  Organic PVD. This is the more common type. It is caused by damage to the structure of blood vessels.  Functional PVD. This is caused by conditions that make blood vessels contract and tighten (spasm). Without treatment, PVD tends to get worse over time. PVD can also lead to acute ischemic limb. This is when an arm or limb suddenly has trouble getting enough blood. This is a medical emergency. CAUSES Each type of PVD has many different causes. The most common cause of PVD is buildup of a fatty material (plaque) inside of your arteries (atherosclerosis). Small amounts of plaque can break off from the walls of the blood vessels and become lodged in a smaller artery. This blocks blood flow and can cause acute ischemic limb. Other common causes of PVD include:  Blood clots that form inside of blood vessels.  Injuries to blood vessels.  Diseases that cause inflammation of blood vessels or cause blood vessel spasms.  Health behaviors and health history that increase your risk of developing PVD. RISK FACTORS  You may have a greater risk of PVD if you:  Have a family history of PVD.  Have certain medical conditions, including:  High cholesterol.  Diabetes.  High blood pressure (hypertension).  Coronary heart disease.  Past problems with blood clots.  Past injury, such as burns or a broken bone. These may have damaged blood  vessels in your limbs.  Buerger disease. This is caused by inflamed blood vessels in your hands and feet.  Some forms of arthritis.  Rare birth defects that affect the arteries in your legs.  Use tobacco.  Do not get enough exercise.  Are obese.  Are age 50 or older. SIGNS AND SYMPTOMS  PVD may cause many different symptoms. Your symptoms depend on what part of your body is not getting enough blood. Some common signs and symptoms include:  Cramps in your lower legs. This may be a symptom of poor leg circulation (claudication).  Pain and weakness in your legs while you are physically active that goes away when you rest (intermittent claudication).  Leg pain when at rest.  Leg numbness, tingling, or weakness.  Coldness in a leg or foot, especially when compared with the other leg.  Skin or hair changes. These can include:  Hair loss.  Shiny skin.  Pale or bluish skin.  Thick toenails.  Inability to get or maintain an erection (erectile dysfunction). People with PVD are more prone to developing ulcers and sores on their toes, feet, or legs. These may take longer than   normal to heal. DIAGNOSIS Your health care provider may diagnose PVD from your signs and symptoms. The health care provider will also do a physical exam. You may have tests to find out what is causing your PVD and determine its severity. Tests may include:  Blood pressure recordings from your arms and legs and measurements of the strength of your pulses (pulse volume recordings).  Imaging studies using sound waves to take pictures of the blood flow through your blood vessels (Doppler ultrasound).  Injecting a dye into your blood vessels before having imaging studies using:  X-rays (angiogram or arteriogram).  Computer-generated X-rays (CT angiogram).  A powerful electromagnetic field and a computer (magnetic resonance angiogram or MRA). TREATMENT Treatment for PVD depends on the cause of your condition  and the severity of your symptoms. It also depends on your age. Underlying causes need to be treated and controlled. These include long-lasting (chronic) conditions, such as diabetes, high cholesterol, and high blood pressure. You may need to first try making lifestyle changes and taking medicines. Surgery may be needed if these do not work. Lifestyle changes may include:  Quitting smoking.  Exercising regularly.  Following a low-fat, low-cholesterol diet. Medicines may include:  Blood thinners to prevent blood clots.  Medicines to improve blood flow.  Medicines to improve your blood cholesterol levels. Surgical procedures may include:  A procedure that uses an inflated balloon to open a blocked artery and improve blood flow (angioplasty).  A procedure to put in a tube (stent) to keep a blocked artery open (stent implant).  Surgery to reroute blood flow around a blocked artery (peripheral bypass surgery).  Surgery to remove dead tissue from an infected wound on the affected limb.  Amputation. This is surgical removal of the affected limb. This may be necessary in cases of acute ischemic limb that are not improved through medical or surgical treatments. HOME CARE INSTRUCTIONS  Take medicines only as directed by your health care provider.  Do not use any tobacco products, including cigarettes, chewing tobacco, or electronic cigarettes. If you need help quitting, ask your health care provider.  Lose weight if you are overweight, and maintain a healthy weight as directed by your health care provider.  Eat a diet that is low in fat and cholesterol. If you need help, ask your health care provider.  Exercise regularly. Ask your health care provider to suggest some good activities for you.  Use compression stockings or other mechanical devices as directed by your health care provider.  Take good care of your feet.  Wear comfortable shoes that fit well.  Check your feet often for  any cuts or sores. SEEK MEDICAL CARE IF:  You have cramps in your legs while walking.  You have leg pain when you are at rest.  You have coldness in a leg or foot.  Your skin changes.  You have erectile dysfunction.  You have cuts or sores on your feet that are not healing. SEEK IMMEDIATE MEDICAL CARE IF:  Your arm or leg turns cold and blue.  Your arms or legs become red, warm, swollen, painful, or numb.  You have chest pain or trouble breathing.  You suddenly have weakness in your face, arm, or leg.  You become very confused or lose the ability to speak.  You suddenly have a very bad headache or lose your vision.   This information is not intended to replace advice given to you by your health care provider. Make sure you discuss any questions   you have with your health care provider.   Document Released: 04/29/2004 Document Revised: 04/12/2014 Document Reviewed: 08/30/2013 Elsevier Interactive Patient Education 2016 [employer]vier Inc.  

## 2015-04-01 NOTE — Progress Notes (Signed)
Filed Vitals:   04/01/15 1011 04/01/15 1014 04/01/15 1019  BP: 170/76 138/70 150/70  Pulse: 60 56 60  Temp:  97.2 F (36.2 C)   TempSrc:  Oral   Resp:  16   Height:  5\' 2"  (1.575 m)   Weight:  103 lb (46.72 kg)   SpO2:  98%

## 2015-04-01 NOTE — Progress Notes (Signed)
[facility] [place] HISTORY AND PHYSICAL   MRN : [id number]  History of Present Illness:   Tracy [name] is a 81 y.o. female patient of Dr. Hart RochesterLawson who is status post bilateral common iliac and left external iliac stents placed in 2011. She has a remote history of left CEA. She returns today for routine follow up.  Patient has Positive history of TIA or stroke symptom about 2004 as manifested by repeating the same several phrases which she didr ealize, lasted about 3-4 days. She had another stroke on 07/01/14 as manifested by left leg and left hand numbness, and left leg weakness; she fell once trying to walk. [facility] was [facility], evaluated, states she was told sh had a stroke. Son states that pt has had left thigh and calf pain since the stroke.  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 She presented to ED 07/01/14 with complaints of progressive left sided numbness left sided numbness and weakness with subsequent fall and difficulty walking. Patient was found to be bradycardic with HR 40-50s and MRI/MRA brain done revealing 1 cm acute infarction in the right lateral thalamus, extensive old ischemic changes throughout the brain and no antegrade flow in R-VA. Pelvic films done due to complaints of left hip pain and revealed mild bilateral hip OA. Dr. 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 PPM not needed as patient asymptomatic and Loop recorder not needed per consult by cardiology. Therapy evaluations done and CIR was recommended by MD and Rehab team  Her neurologist is Dr. Roda ShuttersXu.  She denies falling with walking, states she does not walk much, uses her walker. She reports a discomfort in her left lower leg at rest that is no worse with walking. She denies non healing wounds in her feet/legs.   She  has atrial fib.  Her creatinine on 10/25/14 was 1.25 (review of records).   Pt Diabetic: No, patient denies having even though it is listed in her PMHX. 5.6 A1C on 10/25/14.  Pt smoker: former smoker, quit 25-30 years ago  Pt meds include: Statin :No ASA: Yes Other anticoagulants/antiplatelets: Trental, pt indicates one of her blood thinners was stopped due to blood in her stool, it appears to be Plavix    Current Outpatient Prescriptions  Medication Sig Dispense Refill  . amLODipine (NORVASC) 5 MG tablet Take 1 tablet (5 mg total) by mouth daily. 90 tablet 3  . aspirin 81 MG tablet Take 81 mg by mouth daily.      . baclofen (LIORESAL) 10 MG tablet TAKE 0.5 TABLETS (5 MG TOTAL) BY MOUTH 2 (TWO) TIMES DAILY AS NEEDED FOR MUSCLE SPASMS. 30 tablet 0  . benazepril (LOTENSIN) 40 MG tablet Take 0.5 tablets (20 mg total) by mouth daily. 45 tablet 3  . Calcium Carbonate (CALTRATE 600) 1500 MG TABS Take 1 tablet by mouth 2 (two) times daily.     . fenofibrate micronized (LOFIBRA) 134 MG capsule TAKE 1 CAPSULE (134 MG TOTAL) BY MOUTH DAILY. 90 capsule 3  . HYDROcodone-acetaminophen (NORCO/VICODIN) 5-325 MG per tablet 1 tab by mouth at bedtime as needed for pain 30 tablet 0  . latanoprost (XALATAN) 0.005 % ophthalmic solution Place 1 drop into both eyes daily.     . Multiple Vitamin (MULTIVITAMIN) capsule Take 1 capsule by mouth daily. Centrum Silver    . oxybutynin (DITROPAN XL)  5 MG 24 hr tablet 1 tab by mouth daily 90 tablet 3  . pentoxifylline (TRENTAL) 400 MG CR tablet Take 400 mg by mouth daily.    . polyethylene glycol (MIRALAX / GLYCOLAX) packet Take 17 g by mouth daily. 14 each 0   No current facility-administered medications for this visit.    Past Medical History  Diagnosis Date  . Hyperlipidemia   . HTN (hypertension)   . GERD (gastroesophageal reflux disease)   . Osteopenia   . Hx of adenomatous colonic polyps 2007  . Diverticulosis of colon   . DM (diabetes mellitus), type  2 (HCC)   . PVD (peripheral vascular disease) (HCC)     recently dx w/ carotid -left  int carotid >90% 50-60% rt int carotid 02/2010  . PVD (peripheral vascular disease) (HCC)     lf  ABI 0.11 and rt 0.43  . CAROTID STENOSIS   . DIABETES MELLITUS, TYPE II   . DIVERTICULOSIS, COLON   . GERD   . HYPERLIPIDEMIA   . OSTEOPENIA   . Atrial fibrillation (HCC)   . DVT (deep venous thrombosis) (HCC)   . Sinus node dysfunction (HCC) 2011  . Hypertension   . CVA (cerebrovascular accident) (HCC) 2002  . Stroke [facility]) July 01, 2014    Affected Left side    Social History Social History  Substance Use Topics  . Smoking status: Never Smoker   . Smokeless tobacco: Never Used  . Alcohol Use: No    Family History Family History  Problem Relation Age of Onset  . Diabetes Sister     Amputation  . Cancer Mother     colon    Surgical History Past Surgical History  Procedure Laterality Date  . Cns vascular stent per neurosurgeon    . Tubal ligation    . Right shoulder surgery  2010  . Cataract eye surgery    . Lazer eye surgery    . Iliac artery stent  2011    Bilateral CIA stent  . Carotid endarterectomy      No Known Allergies  Current Outpatient Prescriptions  Medication Sig Dispense Refill  . amLODipine (NORVASC) 5 MG tablet Take 1 tablet (5 mg total) by mouth daily. 90 tablet 3  . aspirin 81 MG tablet Take 81 mg by mouth daily.      . baclofen (LIORESAL) 10 MG tablet TAKE 0.5 TABLETS (5 MG TOTAL) BY MOUTH 2 (TWO) TIMES DAILY AS NEEDED FOR MUSCLE SPASMS. 30 tablet 0  . benazepril (LOTENSIN) 40 MG tablet Take 0.5 tablets (20 mg total) by mouth daily. 45 tablet 3  . Calcium Carbonate (CALTRATE 600) 1500 MG TABS Take 1 tablet by mouth 2 (two) times daily.     . fenofibrate micronized (LOFIBRA) 134 MG capsule TAKE 1 CAPSULE (134 MG TOTAL) BY MOUTH DAILY. 90 capsule 3  . HYDROcodone-acetaminophen (NORCO/VICODIN) 5-325 MG per tablet 1 tab by mouth at bedtime as needed for pain 30  tablet 0  . latanoprost (XALATAN) 0.005 % ophthalmic solution Place 1 drop into both eyes daily.     . Multiple Vitamin (MULTIVITAMIN) capsule Take 1 capsule by mouth daily. Centrum Silver    . oxybutynin (DITROPAN XL) 5 MG 24 hr tablet 1 tab by mouth daily 90 tablet 3  . pentoxifylline (TRENTAL) 400 MG CR tablet Take 400 mg by mouth daily.    . polyethylene glycol (MIRALAX / GLYCOLAX) packet Take 17 g by mouth daily. 14 each 0   No current  facility-administered medications for this visit.     REVIEW OF SYSTEMS: See HPI for pertinent positives and negatives.  Physical Examination Filed Vitals:   04/01/15 1011 04/01/15 1014 04/01/15 1019  BP: 170/76 138/70 150/70  Pulse: 60 56 60  Temp:  97.2 F (36.2 C)   TempSrc:  Oral   Resp:  16   Height:   (1.575 m)   Weight:  103 lb (46.72 kg)   SpO2:  98%    Body mass index is 18.83 kg/(m^2).   General: WDWN in NAD Gait: Slow and deliberate, using a rolling walker HENT: WNL Eyes: Pupils are equal. Pulmonary: normal non-labored breathing , no rales, rhonchi, or wheezing Cardiac: Regular rhythm, nodetected murmur. No carotid bruits Abdomen: soft, NT, no palpable masses Skin: no rashes, no ulcers, no cellulitis.   VASCULAR EXAM: Extremities without ischemic changes  without Gangrene; without open wounds. Left toes 2-4 are slightly dark at dorsal aspect, not on plantar surface, no change from previous visit.       LE Pulses Left RIGHT   FEMORAL 1+palpable not palpable    POPLITEAL Not palpable not palpable   POSTERIOR TIBIAL Not palpable not palpable    DORSALIS PEDIS  ANTERIOR TIBIAL Not palpable not palpable      Musculoskeletal: mild generalized muscle wasting/atrophy; no  edema Neurologic: A&O X 3; Appropriate Affect ;  MOTOR FUNCTION: 5/5 Symmetric, CN 2-12 intact Speech is fluent/normal   Significant Diagnostic Studies: Head CT of 02/23/13:  Remote moderate to large size occipital lobe infarcts with  encephalomalacia more notable on the left. Remote appearing moderate  size left cerebellar infarct.  Small infarcts of the thalamus bilaterally, caudate head bilaterally  and right paracentral pontine region of questionable age.  Prior MR made reference to bilateral occipital lobe, bilateral  caudate head and thalamic infarcts.  Small vessel disease type changes.  No intracranial enhancing lesion.  No intracranial hemorrhage.  Global atrophy without hydrocephalus.  Prominent vascular calcifications with narrowing of the vertebral  arteries and carotid arteries.                 Non-Invasive Vascular Imaging (04/01/2015):  Carotid Duplex: Right ICA: 40-59% stenosis Left ICA: patent CEA site with no restenosis. Bilateral vertebral artery is antegrade. No significant change compared to 03/26/14 exam.  Aortoiliac Duplex: Patent bilateral iliac artery stents with greater than 50% stenosis of the bilateral proximal/mid external iliac arteries. No significant stenosis was noted in the remainder of the aortoiliac system, based on limited visualization due to overlying bowel gas. Greater than 50% bilateral EIA stenoses now noted when compared to 03/26/14.  ABI (Date: 04/01/2015)  R:  0.47 (0.48, 11/29/14), DP: monophasic, PT: not detected, peroneal: monophasic, TBI: 0.19 (0.21)  L: 0.48 (0.48), DP: monophasic, PT: monophasic, TBI: 0.24 (0.30)   ASSESSMENT:  Lida Berkery Gonzalo is a 81 y.o. female who is status post bilateral common iliac and left external iliac stents placed in 2011. She has a remote history of left CEA. She has a history of stroke in 2004 with no residual neurological deficit. She had another stroke on 07/01/14  as manifested by left leg and left hand numbness, and left leg weakness; she fell once trying to walk. She was taken to [facility] on 07/01/14, evaluated, states she was told she had a stroke.  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  Today's carotid duplex suggests mild/moderate right ICA stenosis and no restenosis of  the left CEA site.  Today's bilateral aortoiliac artery duplex suggests bilateral iliac artery stents with greater than 50% stenosis of the bilateral proximal/mid external iliac arteries. No significant stenosis was noted in the remainder of the aortoiliac system, based on limited visualization due to overlying bowel gas. Greater than 50% bilateral EIA stenoses now noted when compared to 03/26/14.   ABI's remain stable with severe arterial occlusive disease in both legs.  She is performing seated leg exercises daily as instructed by physical therapy. Unable to palpate right femoral pulse, no change from previous visit. She has rest pain in left lower leg since her last stroke that caused left hemiparesis, mild residual weakness. This left lower leg pain is no worse with walking.  I discussed the above with Dr. Hart Rochester and pt's iliac stent duplex and ABI results.   Face to face time with patient was 25 minutes. Over 50% of this time was spent on counseling and coordination of care.    PLAN:   Daily seated leg exercises discussed and demonstrated.   Based on today's exam and non-invasive vascular lab results, the patient will follow up in 6 months with the following tests: ABI's. Return in a year with ABI's, bilateral iliac stent duplex, and carotid duplex.  I discussed in depth with the patient the nature of atherosclerosis, and emphasized the importance of maximal medical management including strict control of blood pressure, blood glucose, and lipid levels, obtaining regular exercise, and cessation of smoking.  The  patient is aware that without maximal medical management the underlying atherosclerotic disease process will progress, limiting the benefit of any interventions.  The patient was given information about stroke prevention and what symptoms should prompt the patient to seek immediate medical care.  The patient was given information about PAD including signs, symptoms, treatment, what symptoms should prompt the patient to seek immediate medical care, and risk reduction measures to take. Thank you for allowing [place] to participate in this patient's care.  Charisse March, RN, MSN, FNP-C [facility] Office: [phone]  Clinic MD: Hart Rochester  04/01/2015 9:46 AM

## 2015-04-17 ENCOUNTER — Encounter (INDEPENDENT_AMBULATORY_CARE_PROVIDER_SITE_OTHER): Payer: Self-pay

## 2015-04-17 ENCOUNTER — Other Ambulatory Visit: Payer: Self-pay | Admitting: Internal Medicine

## 2015-04-17 ENCOUNTER — Ambulatory Visit (INDEPENDENT_AMBULATORY_CARE_PROVIDER_SITE_OTHER): Payer: Medicare Other | Admitting: Neurology

## 2015-04-17 ENCOUNTER — Encounter: Payer: Self-pay | Admitting: Neurology

## 2015-04-17 VITALS — BP 137/67 | HR 59 | Ht 59.0 in | Wt 104.0 lb

## 2015-04-17 DIAGNOSIS — I10 Essential (primary) hypertension: Secondary | ICD-10-CM | POA: Diagnosis not present

## 2015-04-17 DIAGNOSIS — I63331 Cerebral infarction due to thrombosis of right posterior cerebral artery: Secondary | ICD-10-CM | POA: Diagnosis not present

## 2015-04-17 DIAGNOSIS — I739 Peripheral vascular disease, unspecified: Secondary | ICD-10-CM | POA: Diagnosis not present

## 2015-04-17 DIAGNOSIS — R001 Bradycardia, unspecified: Secondary | ICD-10-CM

## 2015-04-17 NOTE — Progress Notes (Signed)
STROKE NEUROLOGY FOLLOW UP NOTE  NAME: Tracy [name] DOB: August 02, 1926  REASON FOR VISIT: stroke follow up HISTORY FROM: son and chart  Today we had the pleasure of seeing Tracy [name] in follow-up at our Neurology Clinic. Pt was accompanied by son.   History Summary [name] is a 82 y.o. female with history of history of hypertension, sick sinus syndrome, bradycardia, carotid stenosis, PVD, and "mini-stroke" years ago was admitted on 07/01/14 for left sided weakness and numbness. She has "afib" listed in her problem list but there was no documentation to support this. MRI showed small right lateral thalamus acute infarct. Other stroke work up all negative. With lack of risk factors and ? afib in the problem list, we recommended 30 day cardiac event monitoring but Dr. Johney Frame looked through all pt's EKG and did not find evidence of afib and recommended no monitoring based on likely small vessel disease. She was discharged with continuing dural antiplatelet and low dose statin.   10/15/14 follow up - the patient has been doing fair. She can not tolerate lipitor due to muscle pain and lipitor was later discontinued. She also had one time bright red blood in stool, moderate amount, no recurrence but her plavix was discontinued due to that. She is continued on ASA 81 only. BP 118/80 today.    Interval History During the interval time, pt has been doing well. Has PCP appointment next month. Has regular vascular surgery and cardiology follow up. Improved from walking with walker to cane now. BP 131/67. No complains.  REVIEW OF SYSTEMS: Full 14 system review of systems performed and notable only for those listed below and in HPI above, all others are negative:  Constitutional:   Cardiovascular:  Ear/Nose/Throat:   Skin:  Eyes:   Respiratory:   Gastroitestinal:   Genitourinary:  Hematology/Lymphatic:   Endocrine:  Musculoskeletal:   Allergy/Immunology:   Neurological:     Psychiatric:  Sleep:   The following represents the patient's updated allergies and side effects list: No Known Allergies  The neurologically relevant items on the patient's problem list were reviewed on today's visit.  Neurologic Examination  A problem focused neurological exam (12 or more points of the single system neurologic examination, vital signs counts as 1 point, cranial nerves count for 8 points) was performed.  Blood pressure 137/67, pulse 59, height 4\' 11"  (1.499 m), weight 104 lb (47.174 kg).  General - thin built, well developed, in no apparent distress.  Ophthalmologic - Fundi not visualized due to noncooperation.  Cardiovascular - Regular rate and rhythm.  Mental Status -  Level of arousal and orientation to time, place, and person were intact. Language including expression, naming, repetition, comprehension was assessed and found intact. Fund of Knowledge was assessed and was intact impaired.  Cranial Nerves II - XII - II - Visual field intact OU. III, IV, VI - Extraocular movements intact. V - Facial sensation intact bilaterally. VII - Facial movement intact bilaterally. VIII - Hearing & vestibular intact bilaterally. X - Palate elevates symmetrically. XI - Chin turning & shoulder shrug intact bilaterally. XII - Tongue protrusion intact.  Motor Strength - The patient's strength was normal in all extremities and pronator drift was absent.  Bulk was normal and fasciculations were absent.   Motor Tone - Muscle tone was assessed at the neck and appendages and was normal.  Reflexes - The patient's reflexes were 1+ in all extremities and she had no pathological reflexes.  Sensory -  Light touch, temperature/pinprick were assessed and showed LLE decreased sensation.    Coordination - The patient had normal movements in the hands and feet with no ataxia or dysmetria.  Tremor was absent.  Gait and Station  - Walk with cane, slow, small stride, cautious  gait.  Data reviewed: I personally reviewed the images and agree with the radiology interpretations.  Ct Head (brain) Wo Contrast 07/01/2014 1. No acute intracranial pathology. 2. Chronic microvascular disease and cerebral atrophy. 3. Bilateral old parieto-occipital lobe infarcts and left cerebellar infarct.   Mr Brain Wo Contrast 07/01/2014 Severely motion degraded exam. 1 cm acute infarction in the right lateral thalamus. Extensive old ischemic changes elsewhere throughout the brain.   Dg Chest Port 1 View 07/01/2014 Multiple granulomas. No edema or consolidation. Mild cardiomegaly. Incomplete visualization of sclerosis in the right proximal humerus. This area may represent bone infarct. It may be prudent to consider dedicated right humerus radiographs to further evaluate this region.   Mr Maxine GlennMra Head/brain Wo Cm 07/01/2014 Motion degraded MR angiogram. Major vessels all show flow except for the right vertebral artery.  2D Echocardiogram - LVEF 60-65%, moderately increased LV wall thickness, diastolic dysfunction, indeterminate LV filling pressure, mild MR, moderateRAE, normal LA size.  Carotid Doppler - Bilateral: 1-39% ICA stenosis. Vertebral artery flow is antegrade.  Component     Latest Ref Rng 07/02/2014  Cholesterol     0 - 200 mg/dL 161130  Triglycerides     <150 mg/dL 096114  HDL Cholesterol     >39 mg/dL 40  Total CHOL/HDL Ratio      3.3  VLDL     0 - 40 mg/dL 23  LDL (calc)     0 - 99 mg/dL 67  Hemoglobin E4VA1C     4.8 - 5.6 % 6.3 (H)  Mean Plasma Glucose      134    Assessment: As you may recall, she is a 82 y.o. African American female with PMH of hypertension, sick sinus syndrome, bradycardia, carotid stenosis s/p left CEA, PVD s/p stent, and "mini-stroke" years ago was admitted on 07/01/14 for right thalamic infarct on MRI. Other stroke work up all negatvie/unremarkable including MRA, TTE, CUS, A1C and LDL. Cardiology considered no need for cardiac rhythm  monitoring. Her plavix stopped due to transient LGIB, lipitor took off due to intolerance. On ASA, doing well, no recurrent stroke like symptoms. Improved from walking with walker to cane now.   Plan:  - continue ASA for stroke prevention - check BP at home - Follow up with your primary care physician for stroke risk factor modification. Recommend maintain blood pressure goal <130/80, diabetes with hemoglobin A1c goal below 6.5% and lipids with LDL cholesterol goal below 70 mg/dL.  - home exercise - follow up with vascular surgery - follow up as needed.  No orders of the defined types were placed in this encounter.    No orders of the defined types were placed in this encounter.    Patient Instructions  - continue ASA for stroke prevention - check BP at home - Follow up with your primary care physician for stroke risk factor modification. Recommend maintain blood pressure goal <130/80, diabetes with hemoglobin A1c goal below 6.5% and lipids with LDL cholesterol goal below 70 mg/dL.  - home exercise - follow up with vascular surgery - follow up as needed.     Marvel PlanJindong Trenice Mesa, MD PhD [facility] [address], [address] [place], [place] [address] [phone]

## 2015-04-17 NOTE — Telephone Encounter (Signed)
Trental ER done erx

## 2015-04-17 NOTE — Patient Instructions (Signed)
-   continue ASA for stroke prevention - check BP at home - Follow up with your primary care physician for stroke risk factor modification. Recommend maintain blood pressure goal <130/80, diabetes with hemoglobin A1c goal below 6.5% and lipids with LDL cholesterol goal below 70 mg/dL.  - home exercise - follow up with vascular surgery - follow up as needed.

## 2015-04-30 ENCOUNTER — Ambulatory Visit: Payer: Medicare Other | Admitting: Family Medicine

## 2015-05-06 ENCOUNTER — Ambulatory Visit (INDEPENDENT_AMBULATORY_CARE_PROVIDER_SITE_OTHER): Payer: Medicare Other | Admitting: Family Medicine

## 2015-05-06 ENCOUNTER — Encounter: Payer: Self-pay | Admitting: Family Medicine

## 2015-05-06 ENCOUNTER — Other Ambulatory Visit (INDEPENDENT_AMBULATORY_CARE_PROVIDER_SITE_OTHER): Payer: Medicare Other

## 2015-05-06 VITALS — BP 138/72 | HR 64 | Ht 59.0 in | Wt 100.0 lb

## 2015-05-06 DIAGNOSIS — M75101 Unspecified rotator cuff tear or rupture of right shoulder, not specified as traumatic: Secondary | ICD-10-CM

## 2015-05-06 DIAGNOSIS — M25511 Pain in right shoulder: Secondary | ICD-10-CM

## 2015-05-06 DIAGNOSIS — M75102 Unspecified rotator cuff tear or rupture of left shoulder, not specified as traumatic: Secondary | ICD-10-CM

## 2015-05-06 DIAGNOSIS — M12512 Traumatic arthropathy, left shoulder: Secondary | ICD-10-CM

## 2015-05-06 DIAGNOSIS — M25512 Pain in left shoulder: Secondary | ICD-10-CM

## 2015-05-06 DIAGNOSIS — M12811 Other specific arthropathies, not elsewhere classified, right shoulder: Secondary | ICD-10-CM | POA: Diagnosis not present

## 2015-05-06 DIAGNOSIS — M12812 Other specific arthropathies, not elsewhere classified, left shoulder: Secondary | ICD-10-CM

## 2015-05-06 NOTE — Patient Instructions (Signed)
Good to see you guys Happy new year.  You know the drill  Ice is your friend Stay active See me again in 3 months if you need another injection.

## 2015-05-06 NOTE — Progress Notes (Signed)
[name] [address] [place], [place] [address] Phone: [phone] Subjective:     CC: Bilateral shoulder pain  [id number] [place] P[place] Tracy [name] is a 82 y.o. female coming in with complaint of bilateral shoulder pain. Describes the pain as a dull throbbing aching sensation. Having increasing weakness as well. Does have known severe osteoarthritic changes of the shoulders bilaterally with rotator cuff arthropathy. Patient remains trying to stay active. Last injection was greater than 3 months ago. Seems to help for proximal wing 3 months and then pain starts coming back fairly intensively and quickly. Denies any new symptoms. States though that it is waking her up at night..     Past medical history, social, surgical and family history all reviewed in electronic medical record.   Review of Systems: No headache, visual changes, nausea, vomiting, diarrhea, constipation, dizziness, abdominal pain, skin rash, fevers, chills, night sweats, weight loss, swollen lymph nodes, body aches, joint swelling, muscle aches, chest pain, shortness of breath, mood changes.   Objective Blood pressure 138/72, pulse 64, height  (1.499 m), weight 100 lb (45.36 kg), SpO2 95 %.  General: No apparent distress alert and oriented x3 mood and affect normal, dressed appropriately. Frail HEENT: Pupils equal, extraocular movements intact  Respiratory: Patient's speak in full sentences and does not appear short of breath  Cardiovascular: No lower extremity edema, non tender, no erythema  Skin: Warm dry intact with no signs of infection or rash on extremities or on axial skeleton.  Abdomen: Soft nontender  Neuro: Cranial nerves II through XII are intact, neurovascularly intact in all extremities with 2+ DTRs and 2+ pulses.  Lymph: No lymphadenopathy of posterior or anterior cervical chain or axillae bilaterally.  Gait slow gait with some shoveling.  MSK:  Non tender with full range  of motion and good stability and symmetric strength and tone of shoulders, elbows, wrist, hip, knee and ankles bilaterally. Patient though is very frail and does have osteoarthritic changes of multiple joints. Shoulder: Bilateral Inspection reveals significant atrophy of the musculature bilaterally Generalized tenderness of the shoulder region mostly posterior ROM not lacking the last 10 of forward flexion passively but does have severe crepitus bilaterally Rotator cuff strength 3/5. Positive signs of impingement Speeds and Yergason's tests normal. No labral pathology noted with negative Obrien's, negative clunk and good stability. Normal scapular function observed. painful arc and no drop arm sign.    Procedure: Real-time Ultrasound Guided Injection of left glenohumeral joint Device: GE Logiq E  Ultrasound guided injection is preferred based studies that show increased duration, increased effect, greater accuracy, decreased procedural pain, increased response rate with ultrasound guided versus blind injection.  Verbal informed consent obtained.  Time-out conducted.  Noted no overlying erythema, induration, or other signs of local infection.  Skin prepped in a sterile fashion.  Local anesthesia: Topical Ethyl chloride.  With sterile technique and under real time ultrasound guidance:  Joint visualized.  23g 1  inch needle inserted posterior approach. Pictures taken for needle placement. Patient did have injection of 2 cc of 1% lidocaine, 2 cc of 0.5% Marcaine, and 1cc of Kenalog 40 mg/dL. Completed without difficulty  Pain immediately resolved suggesting accurate placement of the medication.  Advised to call if fevers/chills, erythema, induration, drainage, or persistent bleeding.  Images permanently stored and available for review in the ultrasound unit.  Impression: Technically successful ultrasound guided injection.   Procedure: Real-time Ultrasound Guided Injection of right  glenohumeral joint Device: GE Logiq E  Ultrasound guided injection is preferred based studies that show increased duration, increased effect, greater accuracy, decreased procedural pain, increased response rate with ultrasound guided versus blind injection.  Verbal informed consent obtained.  Time-out conducted.  Noted no overlying erythema, induration, or other signs of local infection.  Skin prepped in a sterile fashion.  Local anesthesia: Topical Ethyl chloride.  With sterile technique and under real time ultrasound guidance:  Joint visualized.  23g 1  inch needle inserted posterior approach. Pictures taken for needle placement. Patient did have injection of 2 cc of 1% lidocaine, 2 cc of 0.5% Marcaine, and 1.0 cc of Kenalog 40 mg/dL. Completed without difficulty  Pain immediately resolved suggesting accurate placement of the medication.  Advised to call if fevers/chills, erythema, induration, drainage, or persistent bleeding.  Images permanently stored and available for review in the ultrasound unit.  Impression: Technically successful ultrasound guided injection.     Impression and Recommendations:     This case required medical decision making of moderate complexity.

## 2015-05-06 NOTE — Assessment & Plan Note (Signed)
Due to the severity of arthritic changes of the shoulder ultrasound was used for the injection. Patient will continue with conservative therapy and we can repeat every 3 months. Avoid any anti-inflammatory sedentary to patient's history of GI bleeding. Encourage the possibility of Tylenol for breakthrough pain.

## 2015-05-06 NOTE — Progress Notes (Signed)
Pre visit review using our clinic review tool, if applicable. No additional management support is needed unless otherwise documented below in the visit note. 

## 2015-05-06 NOTE — Assessment & Plan Note (Signed)
Patient was given an injection today. Tolerated the procedure well. Been 3 months since previous injection. Encourage patient to continue the icing and can do some over-the-counter natural rubs. We'll avoid any oral anti-inflammatories secondary to comorbidities. Return to clinic in 3 months.

## 2015-05-12 ENCOUNTER — Telehealth: Payer: Self-pay | Admitting: Family Medicine

## 2015-05-12 NOTE — Telephone Encounter (Signed)
Spoke to pt's son, scheduled her for 4pm tomorrow.

## 2015-05-12 NOTE — Telephone Encounter (Signed)
Son called saying she needs to come in today regarding her shoulder pain He can be reached at [phone]

## 2015-05-13 ENCOUNTER — Encounter: Payer: Self-pay | Admitting: Family Medicine

## 2015-05-13 ENCOUNTER — Ambulatory Visit (INDEPENDENT_AMBULATORY_CARE_PROVIDER_SITE_OTHER): Payer: Medicare Other | Admitting: Family Medicine

## 2015-05-13 VITALS — BP 110/76 | HR 49 | Ht 59.0 in | Wt 97.0 lb

## 2015-05-13 DIAGNOSIS — M25511 Pain in right shoulder: Secondary | ICD-10-CM | POA: Diagnosis not present

## 2015-05-13 MED ORDER — GABAPENTIN 100 MG PO CAPS: 100.0000 mg | ORAL_CAPSULE | Freq: Every day | ORAL | Status: DC

## 2015-05-13 MED ORDER — HYDROCODONE-ACETAMINOPHEN 7.5-325 MG PO TABS: 1.0000 | ORAL_TABLET | Freq: Three times a day (TID) | ORAL | Status: DC | PRN

## 2015-05-13 MED ORDER — PREDNISONE 20 MG PO TABS: 20.0000 mg | ORAL_TABLET | Freq: Every day | ORAL | Status: DC

## 2015-05-13 NOTE — Progress Notes (Signed)
Pre visit review using our clinic review tool, if applicable. No additional management support is needed unless otherwise documented below in the visit note. 

## 2015-05-13 NOTE — Assessment & Plan Note (Signed)
Patient given injections today with some mild to moderate benefit and pain. I do think that cervical radiculopathy is within the differential. Patient given a variation small dose of gabapentin could be beneficial. Tinel want to go up any higher on the dose secondary to patient's age and comorbidities. Patient does have pain medications that she has taken regularly for some time. Given a short course of prednisone. Patient is a diabetic but last A1c is 5.6. Discuss with patients on to watch blood sugars one closer. Worsening symptoms to go to the emergency department. Patient will come back in 1-2 weeks for further evaluation.

## 2015-05-13 NOTE — Patient Instructions (Addendum)
Good to see you  Prednisone daily for 5 days  Gabapentin  at night Tried some injections in the muscle See me again in 2 weeks otherwise if worsening pain go to the emergency room.

## 2015-05-13 NOTE — Progress Notes (Signed)
[name] [address] [place], [place] [address] Phone: [phone] Subjective:     CC: Right sided back and shoulder pain  [id number] Tracy [name] is a 82 y.o. female coming in with complaint of right shoulder pain. Patient was seen last week and had bilateral shoulder pain injections for severe rotator cuff arthropathy. Patient states that it seems to be more on the right back area. Severely tender. Gives her pain even throughout the night. Describes it as a sharp pain. Has more of a spasming as well. Denies any fevers, any weight loss. Patient states that the severe amount of pain at this moment. Denies any chest pain. Denies any dizziness, denies any numbness.  Past Medical History  Diagnosis Date  . Hyperlipidemia   . HTN (hypertension)   . GERD (gastroesophageal reflux disease)   . Osteopenia   . Hx of adenomatous colonic polyps 2007  . Diverticulosis of colon   . DM (diabetes mellitus), type 2 (HCC)   . PVD (peripheral vascular disease) (HCC)     recently dx w/ carotid -left  int carotid >90% 50-60% rt int carotid 02/2010  . PVD (peripheral vascular disease) (HCC)     lf  ABI 0.11 and rt 0.43  . CAROTID STENOSIS   . DIABETES MELLITUS, TYPE II   . DIVERTICULOSIS, COLON   . GERD   . HYPERLIPIDEMIA   . OSTEOPENIA   . Atrial fibrillation (HCC)   . DVT (deep venous thrombosis) (HCC)   . Sinus node dysfunction (HCC) 2011  . Hypertension   . CVA (cerebrovascular accident) (HCC) 2002  . Stroke [facility]) July 01, 2014    Affected Left side   Past Surgical History  Procedure Laterality Date  . Cns vascular stent per neurosurgeon    . Tubal ligation    . Right shoulder surgery  2010  . Cataract eye surgery    . Lazer eye surgery    . Iliac artery stent  2011    Bilateral CIA stent  . Carotid endarterectomy    . Eye surgery     Social History  Substance Use Topics  . Smoking status: Never Smoker   . Smokeless tobacco: Never Used    . Alcohol Use: No   No Known Allergies Family History  Problem Relation Age of Onset  . Diabetes Sister     Amputation  . Cancer Mother     colon        Past medical history, social, surgical and family history all reviewed in electronic medical record.   Review of Systems: No headache, visual changes, nausea, vomiting, diarrhea, constipation, dizziness, abdominal pain, skin rash, fevers, chills, night sweats, weight loss, swollen lymph nodes, body aches, joint swelling, muscle aches, chest pain, shortness of breath, mood changes.   Objective Blood pressure 110/76, pulse 49, height  (1.499 m), weight 97 lb (43.999 kg), SpO2 96 %.  General: No apparent distress alert and oriented x3 mood and affect normal, dressed appropriately. Frail HEENT: Pupils equal, extraocular movements intact  Respiratory: Patient's speak in full sentences and does not appear short of breath  Cardiovascular: No lower extremity edema, non tender, no erythema  Skin: Warm dry intact with no signs of infection or rash on extremities or on axial skeleton.  Abdomen: Soft nontender  Neuro: Cranial nerves II through XII are intact, neurovascularly intact in all extremities with 2+ DTRs and 2+ pulses.  Lymph: No lymphadenopathy of posterior or anterior cervical chain  or axillae bilaterally.  Gait slow gait with some shoveling.  MSK:  Non tender with full range of motion and good stability and symmetric strength and tone of shoulders, elbows, wrist, hip, knee and ankles bilaterally. Patient though is very frail and does have osteoarthritic changes of multiple joints. Patient does have a positive Spurling's tests of the neck. Pain going down the right trapezius area. Severely tender to palpation in the right area of the skin. Multiple trigger points noted. Procedure note Verbal consent patient was prepped with alcohol swabs and with a 25-gauge 1 inch needle was injected and a total of 4 different trigger points.  He is to positive with 2 mL of 0.5% Marcaine and 1 mL of Kenalog 40 mg/dL. No blood loss    Impression and Recommendations:     This case required medical decision making of moderate complexity.

## 2015-05-21 ENCOUNTER — Encounter: Payer: Self-pay | Admitting: Internal Medicine

## 2015-05-21 ENCOUNTER — Ambulatory Visit (INDEPENDENT_AMBULATORY_CARE_PROVIDER_SITE_OTHER): Payer: Medicare Other | Admitting: Internal Medicine

## 2015-05-21 VITALS — BP 120/60 | HR 61 | Temp 97.7°F | Resp 20 | Wt 104.0 lb

## 2015-05-21 DIAGNOSIS — E119 Type 2 diabetes mellitus without complications: Secondary | ICD-10-CM | POA: Diagnosis not present

## 2015-05-21 DIAGNOSIS — I10 Essential (primary) hypertension: Secondary | ICD-10-CM

## 2015-05-21 DIAGNOSIS — E785 Hyperlipidemia, unspecified: Secondary | ICD-10-CM

## 2015-05-21 DIAGNOSIS — Z0000 Encounter for general adult medical examination without abnormal findings: Secondary | ICD-10-CM | POA: Diagnosis not present

## 2015-05-21 NOTE — Patient Instructions (Addendum)
Please continue all other medications as before, and refills have been done if requested.  Please have the pharmacy call with any other refills you may need.  Please continue your efforts at being more active, low cholesterol diet, and weight control.  You are otherwise up to date with prevention measures today.  Please keep your appointments with your specialists as you may have planned  Please go to the LAB in the Basement (turn left off the elevator) for the tests to be done as you can  You will be contacted by phone if any changes need to be made immediately.  Otherwise, you will receive a letter about your results with an explanation, but please check with MyChart first.  Please remember to sign up for MyChart if you have not done so, as this will be important to you in the future with finding out test results, communicating by private email, and scheduling acute appointments online when needed.  Please return in 6 months, or sooner if needed, with Lab testing done 3-5 days before

## 2015-05-21 NOTE — Progress Notes (Signed)
Pre visit review using our clinic review tool, if applicable. No additional management support is needed unless otherwise documented below in the visit note. 

## 2015-05-21 NOTE — Progress Notes (Signed)
Subjective:    Patient ID: Tracy [name], female    DOB: 1926-05-07, 82 y.o.   MRN: [id number]  HPI  Here for wellness and f/u;  Overall doing ok;  Pt denies Chest pain, worsening SOB, DOE, wheezing, orthopnea, PND, worsening LE edema, palpitations, dizziness or syncope.  Pt denies neurological change such as new headache, facial or extremity weakness.  Pt denies polydipsia, polyuria, or low sugar symptoms. Pt states overall good compliance with treatment and medications, good tolerability, and has been trying to follow appropriate diet.  Pt denies worsening depressive symptoms, suicidal ideation or panic. No fever, night sweats, wt loss, loss of appetite, or other constitutional symptoms.  Pt states good ability with ADL's, has low fall risk, home safety reviewed and adequate, no other significant changes in hearing or vision. Wt Readings from Last 3 Encounters:  05/21/15 104 lb (47.174 kg)  05/13/15 97 lb (43.999 kg)  05/06/15 100 lb (45.36 kg)   Past Medical History  Diagnosis Date  . Hyperlipidemia   . HTN (hypertension)   . GERD (gastroesophageal reflux disease)   . Osteopenia   . Hx of adenomatous colonic polyps 2007  . Diverticulosis of colon   . DM (diabetes mellitus), type 2 (HCC)   . PVD (peripheral vascular disease) (HCC)     recently dx w/ carotid -left  int carotid >90% 50-60% rt int carotid 02/2010  . PVD (peripheral vascular disease) (HCC)     lf  ABI 0.11 and rt 0.43  . CAROTID STENOSIS   . DIABETES MELLITUS, TYPE II   . DIVERTICULOSIS, COLON   . GERD   . HYPERLIPIDEMIA   . OSTEOPENIA   . Atrial fibrillation (HCC)   . DVT (deep venous thrombosis) (HCC)   . Sinus node dysfunction (HCC) 2011  . Hypertension   . CVA (cerebrovascular accident) (HCC) 2002  . Stroke [facility]) July 01, 2014    Affected Left side   Past Surgical History  Procedure Laterality Date  . Cns vascular stent per neurosurgeon    . Tubal ligation    . Right shoulder surgery  2010  .  Cataract eye surgery    . Lazer eye surgery    . Iliac artery stent  2011    Bilateral CIA stent  . Carotid endarterectomy    . Eye surgery      reports that she has never smoked. She has never used smokeless tobacco. She reports that she does not drink alcohol or use illicit drugs. family history includes Cancer in her mother; Diabetes in her sister. No Known Allergies Current Outpatient Prescriptions on File Prior to Visit  Medication Sig Dispense Refill  . amLODipine (NORVASC) 5 MG tablet Take 1 tablet (5 mg total) by mouth daily. 90 tablet 3  . aspirin 81 MG tablet Take 81 mg by mouth daily.      . benazepril (LOTENSIN) 40 MG tablet Take 0.5 tablets (20 mg total) by mouth daily. 45 tablet 3  . Calcium Carbonate (CALTRATE 600) 1500 MG TABS Take 1 tablet by mouth 2 (two) times daily.     . fenofibrate micronized (LOFIBRA) 134 MG capsule TAKE 1 CAPSULE (134 MG TOTAL) BY MOUTH DAILY. 90 capsule 3  . gabapentin (NEURONTIN) 100 MG capsule Take 1 capsule (100 mg total) by mouth at bedtime. 30 capsule 3  . HYDROcodone-acetaminophen (NORCO) 7.5-325 MG tablet Take 1 tablet by mouth every 8 (eight) hours as needed for moderate pain (cough). 30 tablet 0  . Multiple  Vitamin (MULTIVITAMIN) capsule Take 1 capsule by mouth daily. Centrum Silver    . oxybutynin (DITROPAN XL) 5 MG 24 hr tablet 1 tab by mouth daily 90 tablet 3  . pentoxifylline (TRENTAL) 400 MG CR tablet TAKE 1 TABLET (400 MG TOTAL) BY MOUTH DAILY. 90 tablet 3  . predniSONE (DELTASONE) 20 MG tablet Take 1 tablet (20 mg total) by mouth daily with breakfast. 5 tablet 0   No current facility-administered medications on file prior to visit.   Review of Systems Constitutional: Negative for increased diaphoresis, other activity, appetite or siginficant weight change other than noted HENT: Negative for worsening hearing loss, ear pain, facial swelling, mouth sores and neck stiffness.   Eyes: Negative for other worsening pain, redness or  visual disturbance.  Respiratory: Negative for shortness of breath and wheezing  Cardiovascular: Negative for chest pain and palpitations.  Gastrointestinal: Negative for diarrhea, blood in stool, abdominal distention or other pain Genitourinary: Negative for hematuria, flank pain or change in urine volume.  Musculoskeletal: Negative for myalgias or other joint complaints.  Skin: Negative for color change and wound or drainage.  Neurological: Negative for syncope and numbness. other than noted Hematological: Negative for adenopathy. or other swelling Psychiatric/Behavioral: Negative for hallucinations, SI, self-injury, decreased concentration or other worsening agitation.      Objective:   Physical Exam BP 120/60 mmHg  Pulse 61  Temp(Src) 97.7 F (36.5 C) (Oral)  Resp 20  Wt 104 lb (47.174 kg)  SpO2 97% VS noted,  Constitutional: Pt is oriented to person, place, and time. Appears well-developed and well-nourished, in no significant distress Head: Normocephalic and atraumatic.  Right Ear: External ear normal.  Left Ear: External ear normal.  Nose: Nose normal.  Mouth/Throat: Oropharynx is clear and moist.  Eyes: Conjunctivae and EOM are normal. Pupils are equal, round, and reactive to light.  Neck: Normal range of motion. Neck supple. No JVD present. No tracheal deviation present or significant neck LA or mass Cardiovascular: Normal rate, regular rhythm, normal heart sounds and intact distal pulses.   Pulmonary/Chest: Effort normal and breath sounds without rales or wheezing  Abdominal: Soft. Bowel sounds are normal. NT. No HSM  Musculoskeletal: Normal range of motion. Exhibits no edema.  Lymphadenopathy:  Has no cervical adenopathy.  Neurological: Pt is alert and oriented to person, place, and time. Pt has normal reflexes. No cranial nerve deficit. Motor grossly intact Skin: Skin is warm and dry. No rash noted.  Psychiatric:  Has normal mood and affect. Behavior is normal.       Assessment & Plan:

## 2015-05-23 DIAGNOSIS — E113299 Type 2 diabetes mellitus with mild nonproliferative diabetic retinopathy without macular edema, unspecified eye: Secondary | ICD-10-CM | POA: Diagnosis not present

## 2015-05-23 DIAGNOSIS — H35352 Cystoid macular degeneration, left eye: Secondary | ICD-10-CM | POA: Diagnosis not present

## 2015-05-23 DIAGNOSIS — H3530 Unspecified macular degeneration: Secondary | ICD-10-CM | POA: Diagnosis not present

## 2015-05-27 ENCOUNTER — Encounter: Payer: Self-pay | Admitting: Family Medicine

## 2015-05-27 ENCOUNTER — Other Ambulatory Visit (INDEPENDENT_AMBULATORY_CARE_PROVIDER_SITE_OTHER): Payer: Medicare Other

## 2015-05-27 ENCOUNTER — Ambulatory Visit (INDEPENDENT_AMBULATORY_CARE_PROVIDER_SITE_OTHER): Payer: Medicare Other | Admitting: Family Medicine

## 2015-05-27 VITALS — BP 90/62 | HR 46 | Ht 59.0 in | Wt 101.0 lb

## 2015-05-27 DIAGNOSIS — E119 Type 2 diabetes mellitus without complications: Secondary | ICD-10-CM

## 2015-05-27 DIAGNOSIS — M12512 Traumatic arthropathy, left shoulder: Secondary | ICD-10-CM | POA: Diagnosis not present

## 2015-05-27 DIAGNOSIS — M75101 Unspecified rotator cuff tear or rupture of right shoulder, not specified as traumatic: Principal | ICD-10-CM

## 2015-05-27 DIAGNOSIS — Z0000 Encounter for general adult medical examination without abnormal findings: Secondary | ICD-10-CM

## 2015-05-27 DIAGNOSIS — M12812 Other specific arthropathies, not elsewhere classified, left shoulder: Secondary | ICD-10-CM

## 2015-05-27 DIAGNOSIS — M75102 Unspecified rotator cuff tear or rupture of left shoulder, not specified as traumatic: Secondary | ICD-10-CM

## 2015-05-27 DIAGNOSIS — M12811 Other specific arthropathies, not elsewhere classified, right shoulder: Secondary | ICD-10-CM | POA: Diagnosis not present

## 2015-05-27 LAB — BASIC METABOLIC PANEL
BUN: 30 mg/dL — ABNORMAL HIGH (ref 6–23)
CALCIUM: 10 mg/dL (ref 8.4–10.5)
CO2: 31 mEq/L (ref 19–32)
CREATININE: 1.15 mg/dL (ref 0.40–1.20)
Chloride: 104 mEq/L (ref 96–112)
GFR: 57.52 mL/min — AB (ref >60.00)
GLUCOSE: 130 mg/dL — AB (ref 70–99)
Potassium: 4.5 mEq/L (ref 3.5–5.1)
SODIUM: 142 meq/L (ref 135–145)

## 2015-05-27 LAB — CBC WITH DIFFERENTIAL/PLATELET
BASOS ABS: 0 10*3/uL (ref 0.0–0.1)
Basophils Relative: 0.4 % (ref 0.0–3.0)
EOS PCT: 1.7 % (ref 0.0–5.0)
Eosinophils Absolute: 0.1 10*3/uL (ref 0.0–0.7)
HEMATOCRIT: 35.8 % — AB (ref 36.0–46.0)
HEMOGLOBIN: 11.7 g/dL — AB (ref 12.0–15.0)
LYMPHS ABS: 1.2 10*3/uL (ref 0.7–4.0)
LYMPHS PCT: 15.8 % (ref 12.0–46.0)
MCHC: 32.8 g/dL (ref 30.0–36.0)
MCV: 88.3 fl (ref 78.0–100.0)
MONOS PCT: 6.5 % (ref 3.0–12.0)
Monocytes Absolute: 0.5 10*3/uL (ref 0.1–1.0)
NEUTROS PCT: 75.6 % (ref 43.0–77.0)
Neutro Abs: 5.9 10*3/uL (ref 1.4–7.7)
Platelets: 299 10*3/uL (ref 150.0–400.0)
RBC: 4.05 Mil/uL (ref 3.87–5.11)
RDW: 17.7 % — ABNORMAL HIGH (ref 11.5–15.5)
WBC: 7.8 10*3/uL (ref 4.0–10.5)

## 2015-05-27 LAB — URINALYSIS, ROUTINE W REFLEX MICROSCOPIC
BILIRUBIN URINE: NEGATIVE
Hgb urine dipstick: NEGATIVE
Ketones, ur: NEGATIVE
LEUKOCYTES UA: NEGATIVE
NITRITE: NEGATIVE
PH: 6.5 (ref 5.0–8.0)
RBC / HPF: NONE SEEN (ref 0–?)
Specific Gravity, Urine: 1.02 (ref 1.000–1.030)
TOTAL PROTEIN, URINE-UPE24: 30 — AB
URINE GLUCOSE: NEGATIVE
UROBILINOGEN UA: 0.2 (ref 0.0–1.0)

## 2015-05-27 LAB — MICROALBUMIN / CREATININE URINE RATIO
CREATININE, U: 109.7 mg/dL
Microalb Creat Ratio: 22.2 mg/g (ref 0.0–30.0)
Microalb, Ur: 24.4 mg/dL — ABNORMAL HIGH (ref 0.0–1.9)

## 2015-05-27 LAB — HEPATIC FUNCTION PANEL
ALBUMIN: 3.9 g/dL (ref 3.5–5.2)
ALK PHOS: 40 U/L (ref 39–117)
ALT: 9 U/L (ref 0–35)
AST: 16 U/L (ref 0–37)
Bilirubin, Direct: 0.1 mg/dL (ref 0.0–0.3)
TOTAL PROTEIN: 6.9 g/dL (ref 6.0–8.3)
Total Bilirubin: 0.4 mg/dL (ref 0.2–1.2)

## 2015-05-27 LAB — LIPID PANEL
CHOLESTEROL: 141 mg/dL (ref 0–200)
HDL: 54 mg/dL (ref >39.00)
LDL CALC: 53 mg/dL (ref 0–99)
NONHDL: 86.93
Total CHOL/HDL Ratio: 3
Triglycerides: 172 mg/dL — ABNORMAL HIGH (ref 0.0–149.0)
VLDL: 34.4 mg/dL (ref 0.0–40.0)

## 2015-05-27 LAB — TSH: TSH: 1.21 u[IU]/mL (ref 0.35–4.50)

## 2015-05-27 LAB — HEMOGLOBIN A1C: Hgb A1c MFr Bld: 5.7 % (ref 4.6–6.5)

## 2015-05-27 MED ORDER — GABAPENTIN 100 MG PO CAPS: 100.0000 mg | ORAL_CAPSULE | Freq: Every day | ORAL | Status: DC

## 2015-05-27 NOTE — Assessment & Plan Note (Signed)
stable overall by history and exam, recent data reviewed with pt, and pt to continue medical treatment as before,  to f/u any worsening symptoms or concerns Lab Results  Component Value Date   LDLCALC 53 10/25/2014

## 2015-05-27 NOTE — Assessment & Plan Note (Signed)

## 2015-05-27 NOTE — Assessment & Plan Note (Signed)
Doing well after injection. 

## 2015-05-27 NOTE — Assessment & Plan Note (Signed)
stable overall by history and exam, recent data reviewed with pt, and pt to continue medical treatment as before,  to f/u any worsening symptoms or concerns Lab Results  Component Value Date   HGBA1C 5.6 10/25/2014    

## 2015-05-27 NOTE — Assessment & Plan Note (Signed)
Patient is doing better at this time. I do not think that she will likely need another injection for intended 12 weeks. Patient is doing better with gabapentin. If any worsening symptoms I would like cervical workup including x-rays. Patient may be a candidate for an epidural if continuing to have trouble. We may also need to increase her gabapentin. Refill given today though for 90 day supply. Follow up in April.  Spent  25 minutes with patient face-to-face and had greater than 50% of counseling including as described above in assessment and plan.

## 2015-05-27 NOTE — Progress Notes (Signed)
[name] [address] [place], [place] [address] Phone: [phone] Subjective:     CC: Right sided back and shoulder pain  [id number] Kristalyn Bergstresser Riccardi is a 82 y.o. female coming in with complaint of right shoulder pain. Patient was having an exacerbation and did have some muscle tightness. Patient did respond very well to the trigger points as well as the prednisone. Continues the gabapentin at night which has helped her with her sleep. Very happy with the results at this time. States that she can do daily activities without any significant discomfort at this time.  Past Medical History  Diagnosis Date  . Hyperlipidemia   . HTN (hypertension)   . GERD (gastroesophageal reflux disease)   . Osteopenia   . Hx of adenomatous colonic polyps 2007  . Diverticulosis of colon   . DM (diabetes mellitus), type 2 (HCC)   . PVD (peripheral vascular disease) (HCC)     recently dx w/ carotid -left  int carotid >90% 50-60% rt int carotid 02/2010  . PVD (peripheral vascular disease) (HCC)     lf  ABI 0.11 and rt 0.43  . CAROTID STENOSIS   . DIABETES MELLITUS, TYPE II   . DIVERTICULOSIS, COLON   . GERD   . HYPERLIPIDEMIA   . OSTEOPENIA   . Atrial fibrillation (HCC)   . DVT (deep venous thrombosis) (HCC)   . Sinus node dysfunction (HCC) 2011  . Hypertension   . CVA (cerebrovascular accident) (HCC) 2002  . Stroke [facility]) July 01, 2014    Affected Left side   Past Surgical History  Procedure Laterality Date  . Cns vascular stent per neurosurgeon    . Tubal ligation    . Right shoulder surgery  2010  . Cataract eye surgery    . Lazer eye surgery    . Iliac artery stent  2011    Bilateral CIA stent  . Carotid endarterectomy    . Eye surgery     Social History  Substance Use Topics  . Smoking status: Never Smoker   . Smokeless tobacco: Never Used  . Alcohol Use: No   No Known Allergies Family History  Problem Relation Age of Onset  . Diabetes  Sister     Amputation  . Cancer Mother     colon      Past medical history, social, surgical and family history all reviewed in electronic medical record.   Review of Systems: No headache, visual changes, nausea, vomiting, diarrhea, constipation, dizziness, abdominal pain, skin rash, fevers, chills, night sweats, weight loss, swollen lymph nodes, body aches, joint swelling, muscle aches, chest pain, shortness of breath, mood changes.   Objective Blood pressure 90/62, pulse 46, height  (1.499 m), weight 101 lb (45.813 kg), SpO2 97 %.  General: No apparent distress alert and oriented x3 mood and affect normal, dressed appropriately. Frail HEENT: Pupils equal, extraocular movements intact  Respiratory: Patient's speak in full sentences and does not appear short of breath  Cardiovascular: No lower extremity edema, non tender, no erythema  Skin: Warm dry intact with no signs of infection or rash on extremities or on axial skeleton.  Abdomen: Soft nontender  Neuro: Cranial nerves II through XII are intact, neurovascularly intact in all extremities with 2+ DTRs and 2+ pulses.  Lymph: No lymphadenopathy of posterior or anterior cervical chain or axillae bilaterally.  Gait slow gait with some shoveling.  MSK:  Non tender with full range of motion and good  stability and symmetric strength and tone of shoulders, elbows, wrist, hip, knee and ankles bilaterally. Patient though is very frail and does have osteoarthritic changes of multiple joints. Continued positive Portage maneuver. Patient though does have crepitus with range of motion of the shoulders. Patient will trigger points are gone at this timepower seen previously in the trapezius muscle..     Impression and Recommendations:     This case required medical decision making of moderate complexity.

## 2015-05-27 NOTE — Patient Instructions (Signed)
Good to see you  You are doing great! Ice can still help Do tylenol  3 times daily to help with arthritis pain Continue the gabapentin  at night If worsening pain then see me again at next appointment and we will inject the shoulder

## 2015-05-27 NOTE — Progress Notes (Signed)
Pre visit review using our clinic review tool, if applicable. No additional management support is needed unless otherwise documented below in the visit note. 

## 2015-05-27 NOTE — Assessment & Plan Note (Signed)
stable overall by history and exam, recent data reviewed with pt, and pt to continue medical treatment as before,  to f/u any worsening symptoms or concerns BP Readings from Last 3 Encounters:  05/21/15 120/60  05/13/15 110/76  05/06/15 138/72

## 2015-05-28 ENCOUNTER — Encounter: Payer: Self-pay | Admitting: Internal Medicine

## 2015-07-31 ENCOUNTER — Ambulatory Visit: Payer: Medicare Other | Admitting: Family Medicine

## 2015-08-01 ENCOUNTER — Other Ambulatory Visit (INDEPENDENT_AMBULATORY_CARE_PROVIDER_SITE_OTHER): Payer: Medicare Other

## 2015-08-01 ENCOUNTER — Encounter: Payer: Self-pay | Admitting: Family Medicine

## 2015-08-01 ENCOUNTER — Ambulatory Visit (INDEPENDENT_AMBULATORY_CARE_PROVIDER_SITE_OTHER): Payer: Medicare Other | Admitting: Family Medicine

## 2015-08-01 VITALS — BP 120/72 | HR 59 | Ht 59.0 in | Wt 105.0 lb

## 2015-08-01 DIAGNOSIS — M25512 Pain in left shoulder: Secondary | ICD-10-CM

## 2015-08-01 DIAGNOSIS — M12811 Other specific arthropathies, not elsewhere classified, right shoulder: Secondary | ICD-10-CM

## 2015-08-01 DIAGNOSIS — M25511 Pain in right shoulder: Secondary | ICD-10-CM

## 2015-08-01 DIAGNOSIS — M12512 Traumatic arthropathy, left shoulder: Secondary | ICD-10-CM

## 2015-08-01 DIAGNOSIS — M75102 Unspecified rotator cuff tear or rupture of left shoulder, not specified as traumatic: Secondary | ICD-10-CM

## 2015-08-01 DIAGNOSIS — M75101 Unspecified rotator cuff tear or rupture of right shoulder, not specified as traumatic: Secondary | ICD-10-CM

## 2015-08-01 DIAGNOSIS — M12812 Other specific arthropathies, not elsewhere classified, left shoulder: Secondary | ICD-10-CM

## 2015-08-01 MED ORDER — GABAPENTIN 100 MG PO CAPS: 100.0000 mg | ORAL_CAPSULE | Freq: Every day | ORAL | Status: DC

## 2015-08-01 NOTE — Progress Notes (Signed)
[name] [address] [place], [place] [address] Phone: [phone] Subjective:     CC: Bilateral shoulder pain  [id number] Tracy [name] is a 82 y.o. female coming in with complaint of bilateral shoulder pain. Describes the pain as a dull throbbing aching sensation. Having increasing weakness as well. Does have known severe osteoarthritic changes of the shoulders bilaterally with rotator cuff arthropathy.last injections were in January. Starting having worsening symptoms. Not as bad as what it was previously.  States that the gabapentin has been helpful. Is helping her sleep little more frequently and deeper at night.  Past Medical History  Diagnosis Date  . Hyperlipidemia   . HTN (hypertension)   . GERD (gastroesophageal reflux disease)   . Osteopenia   . Hx of adenomatous colonic polyps 2007  . Diverticulosis of colon   . DM (diabetes mellitus), type 2 (HCC)   . PVD (peripheral vascular disease) (HCC)     recently dx w/ carotid -left  int carotid >90% 50-60% rt int carotid 02/2010  . PVD (peripheral vascular disease) (HCC)     lf  ABI 0.11 and rt 0.43  . CAROTID STENOSIS   . DIABETES MELLITUS, TYPE II   . DIVERTICULOSIS, COLON   . GERD   . HYPERLIPIDEMIA   . OSTEOPENIA   . Atrial fibrillation (HCC)   . DVT (deep venous thrombosis) (HCC)   . Sinus node dysfunction (HCC) 2011  . Hypertension   . CVA (cerebrovascular accident) (HCC) 2002  . Stroke [facility]) July 01, 2014    Affected Left side   Past Surgical History  Procedure Laterality Date  . Cns vascular stent per neurosurgeon    . Tubal ligation    . Right shoulder surgery  2010  . Cataract eye surgery    . Lazer eye surgery    . Iliac artery stent  2011    Bilateral CIA stent  . Carotid endarterectomy    . Eye surgery      No Known Allergies Family History  Problem Relation Age of Onset  . Diabetes Sister     Amputation  . Cancer Mother     colon        Past  medical history, social, surgical and family history all reviewed in electronic medical record.   Review of Systems: No headache, visual changes, nausea, vomiting, diarrhea, constipation, dizziness, abdominal pain, skin rash, fevers, chills, night sweats, weight loss, swollen lymph nodes, body aches, joint swelling, muscle aches, chest pain, shortness of breath, mood changes.   Objective Blood pressure 120/72, pulse 59, height  (1.499 m), weight 105 lb (47.628 kg), SpO2 93 %.  General: No apparent distress alert and oriented x3 mood and affect normal, dressed appropriately. Frail HEENT: Pupils equal, extraocular movements intact  Respiratory: Patient's speak in full sentences and does not appear short of breath  Cardiovascular: No lower extremity edema, non tender, no erythema  Skin: Warm dry intact with no signs of infection or rash on extremities or on axial skeleton.  Abdomen: Soft nontender  Neuro: Cranial nerves II through XII are intact, neurovascularly intact in all extremities with 2+ DTRs and 2+ pulses.  Lymph: No lymphadenopathy of posterior or anterior cervical chain or axillae bilaterally.  Gait slow gait with some shoveling.  MSK:  Non tender with full range of motion and good stability and symmetric strength and tone of shoulders, elbows, wrist, hip, knee and ankles bilaterally. Patient though is very frail and does  have osteoarthritic changes of multiple joints. Shoulder: Bilateral Inspection reveals significant atrophy of the musculature bilaterally Generalized tenderness of the shoulder region mostly posterior ROM lacking the last 10 of forward flexion passively but does have severe crepitus bilaterally Rotator cuff strength 3/5.Significant change Positive signs of impingementbilaterally Speeds and Yergason's tests normal. No labral pathology noted with negative Obrien's, negative clunk and good stability. Normal scapular function observed. painful arc and positive  drop arm sign.    Procedure: Real-time Ultrasound Guided Injection of left glenohumeral joint Device: GE Logiq E  Ultrasound guided injection is preferred based studies that show increased duration, increased effect, greater accuracy, decreased procedural pain, increased response rate with ultrasound guided versus blind injection.  Verbal informed consent obtained.  Time-out conducted.  Noted no overlying erythema, induration, or other signs of local infection.  Skin prepped in a sterile fashion.  Local anesthesia: Topical Ethyl chloride.  With sterile technique and under real time ultrasound guidance:  Joint visualized.  23g 1  inch needle inserted posterior approach. Pictures taken for needle placement. Patient did have injection of 2 cc of 1% lidocaine, 2 cc of 0.5% Marcaine, and 1cc of Kenalog 40 mg/dL. Completed without difficulty  Pain immediately resolved suggesting accurate placement of the medication.  Advised to call if fevers/chills, erythema, induration, drainage, or persistent bleeding.  Images permanently stored and available for review in the ultrasound unit.  Impression: Technically successful ultrasound guided injection.   Procedure: Real-time Ultrasound Guided Injection of right glenohumeral joint Device: GE Logiq E  Ultrasound guided injection is preferred based studies that show increased duration, increased effect, greater accuracy, decreased procedural pain, increased response rate with ultrasound guided versus blind injection.  Verbal informed consent obtained.  Time-out conducted.  Noted no overlying erythema, induration, or other signs of local infection.  Skin prepped in a sterile fashion.  Local anesthesia: Topical Ethyl chloride.  With sterile technique and under real time ultrasound guidance:  Joint visualized.  23g 1  inch needle inserted posterior approach. Pictures taken for needle placement. Patient did have injection of 2 cc of 1% lidocaine, 2 cc of  0.5% Marcaine, and 1.0 cc of Kenalog 40 mg/dL. Completed without difficulty  Pain immediately resolved suggesting accurate placement of the medication.  Advised to call if fevers/chills, erythema, induration, drainage, or persistent bleeding.  Images permanently stored and available for review in the ultrasound unit.  Impression: Technically successful ultrasound guided injection.     Impression and Recommendations:     This case required medical decision making of moderate complexity.

## 2015-08-01 NOTE — Progress Notes (Signed)
Pre visit review using our clinic review tool, if applicable. No additional management support is needed unless otherwise documented below in the visit note. 

## 2015-08-01 NOTE — Patient Instructions (Addendum)
Good ot see you  I sent in the 90 day supply for gabapentin  Love the bird house and thank you  Keep doing what you are doing  See you again in 10-12 weeks.

## 2015-08-01 NOTE — Assessment & Plan Note (Signed)
Tolerated the procedure well. Didn't bilateral. Refill patient gabapentin. Encourage patient to continue to remain active. We will continue to avoid anti-inflammatories secondary to history of gastrointestinal bleed. We discussed which activities to do in which ones to avoid. We can have patient have repeat injections every 10-12 weeks safely. Follow-up at that time.  Spent  25 minutes with patient face-to-face and had greater than 50% of counseling including as described above in assessment and plan.

## 2015-09-23 DIAGNOSIS — H35352 Cystoid macular degeneration, left eye: Secondary | ICD-10-CM | POA: Diagnosis not present

## 2015-09-23 DIAGNOSIS — H3530 Unspecified macular degeneration: Secondary | ICD-10-CM | POA: Diagnosis not present

## 2015-10-03 ENCOUNTER — Encounter: Payer: Self-pay | Admitting: Family

## 2015-10-06 ENCOUNTER — Ambulatory Visit: Payer: Medicare Other | Admitting: Family

## 2015-10-06 ENCOUNTER — Ambulatory Visit (HOSPITAL_COMMUNITY)
Admission: RE | Admit: 2015-10-06 | Discharge: 2015-10-06 | Disposition: A | Discharge status: Home / Self Care | Payer: Medicare Other | Source: Ambulatory Visit | Attending: Family | Admitting: Family

## 2015-10-06 DIAGNOSIS — I10 Essential (primary) hypertension: Secondary | ICD-10-CM | POA: Insufficient documentation

## 2015-10-06 DIAGNOSIS — I779 Disorder of arteries and arterioles, unspecified: Secondary | ICD-10-CM

## 2015-10-06 DIAGNOSIS — E785 Hyperlipidemia, unspecified: Secondary | ICD-10-CM | POA: Insufficient documentation

## 2015-10-06 DIAGNOSIS — I771 Stricture of artery: Secondary | ICD-10-CM

## 2015-10-06 DIAGNOSIS — E119 Type 2 diabetes mellitus without complications: Secondary | ICD-10-CM | POA: Diagnosis not present

## 2015-10-06 DIAGNOSIS — Z9889 Other specified postprocedural states: Secondary | ICD-10-CM | POA: Insufficient documentation

## 2015-10-06 DIAGNOSIS — K219 Gastro-esophageal reflux disease without esophagitis: Secondary | ICD-10-CM | POA: Insufficient documentation

## 2015-10-06 DIAGNOSIS — R938 Abnormal findings on diagnostic imaging of other specified body structures: Secondary | ICD-10-CM | POA: Diagnosis not present

## 2015-10-06 DIAGNOSIS — R0989 Other specified symptoms and signs involving the circulatory and respiratory systems: Secondary | ICD-10-CM | POA: Diagnosis present

## 2015-10-06 DIAGNOSIS — Z95828 Presence of other vascular implants and grafts: Secondary | ICD-10-CM | POA: Insufficient documentation

## 2015-10-06 DIAGNOSIS — I6523 Occlusion and stenosis of bilateral carotid arteries: Secondary | ICD-10-CM | POA: Insufficient documentation

## 2015-10-06 DIAGNOSIS — Z48812 Encounter for surgical aftercare following surgery on the circulatory system: Secondary | ICD-10-CM | POA: Diagnosis not present

## 2015-10-14 ENCOUNTER — Encounter: Payer: Self-pay | Admitting: Family

## 2015-10-14 ENCOUNTER — Ambulatory Visit (INDEPENDENT_AMBULATORY_CARE_PROVIDER_SITE_OTHER): Payer: Medicare Other | Admitting: Family

## 2015-10-14 VITALS — BP 113/64 | HR 76 | Temp 97.5°F | Resp 12 | Ht 59.0 in | Wt 107.0 lb

## 2015-10-14 DIAGNOSIS — I6523 Occlusion and stenosis of bilateral carotid arteries: Secondary | ICD-10-CM

## 2015-10-14 DIAGNOSIS — Z95828 Presence of other vascular implants and grafts: Secondary | ICD-10-CM | POA: Diagnosis not present

## 2015-10-14 DIAGNOSIS — I779 Disorder of arteries and arterioles, unspecified: Secondary | ICD-10-CM | POA: Diagnosis not present

## 2015-10-14 DIAGNOSIS — I771 Stricture of artery: Secondary | ICD-10-CM

## 2015-10-14 DIAGNOSIS — Z9889 Other specified postprocedural states: Secondary | ICD-10-CM

## 2015-10-14 NOTE — Progress Notes (Signed)
[facility]   CC: Follow up peripheral artery occlusive disease  History of Present Illness Tracy [name] is a 82 y.o. female patient of Dr. Hart Rochester who is status post bilateral common iliac and left external iliac stents placed in 2011. She has a remote history of left CEA. She returns today for routine follow up.  Patient has Positive history of TIA or stroke symptom about 2004 as manifested by repeating the same several phrases which she did realize, lasted about 3-4 days. She had another stroke on 07/01/14 as manifested by left leg and left hand numbness, and left leg weakness; she fell once trying to walk. She was taken to [facility] on 07/01/14, evaluated, states she was told she had a stroke. Son states that pt has had left thigh and calf pain since the stroke.  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 She presented to ED 07/01/14 with complaints of progressive left sided numbness left sided numbness and weakness with subsequent fall and difficulty walking. Patient was found to be bradycardic with HR 40-50s and MRI/MRA brain done revealing 1 cm acute infarction in the right lateral thalamus, extensive old ischemic changes throughout the brain and no antegrade flow in R-VA. Pelvic films done due to complaints of left hip pain and revealed mild bilateral hip OA. Dr. Roda Shutters felt that patient had thrombotic stroke due to SVD and recommended compliance with antiplatelet medications (ASA, plavix, trental). PPM not needed as patient asymptomatic and Loop recorder not needed per consult by cardiology. Therapy evaluations done and CIR was recommended by MD and Rehab team  Her neurologist is Dr. Roda Shutters.  She denies falling with walking, states she does not walk much, uses her walker. She reports a discomfort in her left lower leg at rest that is no worse with walking. She denies non healing wounds in her  feet/legs.   She has atrial fib.  Her creatinine on 10/25/14 was 1.25 (review of records).   Pt Diabetic: No, patient denies having even though it is listed in her PMHX. 5.6 A1C on 10/25/14.  Pt smoker: former smoker, quit 25-30 years ago  Pt meds include: Statin :No ASA: Yes Other anticoagulants/antiplatelets: Trental, pt indicates one of her blood thinners was stopped due to blood in her stool, it appears to be Plavix      Past Medical History  Diagnosis Date  . Hyperlipidemia   . HTN (hypertension)   . GERD (gastroesophageal reflux disease)   . Osteopenia   . Hx of adenomatous colonic polyps 2007  . Diverticulosis of colon   . DM (diabetes mellitus), type 2 (HCC)   . PVD (peripheral vascular disease) (HCC)     recently dx w/ carotid -left  int carotid >90% 50-60% rt int carotid 02/2010  . PVD (peripheral vascular disease) (HCC)     lf  ABI 0.11 and rt 0.43  . CAROTID STENOSIS   . DIABETES MELLITUS, TYPE II   . DIVERTICULOSIS, COLON   . GERD   . HYPERLIPIDEMIA   . OSTEOPENIA   . Atrial fibrillation (HCC)   . DVT (deep venous thrombosis) (HCC)   . Sinus node dysfunction (HCC) 2011  . Hypertension   . CVA (cerebrovascular accident) (HCC) 2002  . Stroke [facility]) July 01, 2014    Affected Left side    Social History Social History  Substance Use Topics  . Smoking status: Never Smoker   . Smokeless tobacco: Never  Used  . Alcohol Use: No    Family History Family History  Problem Relation Age of Onset  . Diabetes Sister     Amputation  . Cancer Mother     colon    Past Surgical History  Procedure Laterality Date  . Cns vascular stent per neurosurgeon    . Tubal ligation    . Right shoulder surgery  2010  . Cataract eye surgery    . Lazer eye surgery    . Iliac artery stent  2011    Bilateral CIA stent  . Carotid endarterectomy    . Eye surgery      No Known Allergies  Current Outpatient Prescriptions  Medication Sig Dispense Refill  .  amLODipine (NORVASC) 5 MG tablet Take 1 tablet (5 mg total) by mouth daily. 90 tablet 3  . aspirin 81 MG tablet Take 81 mg by mouth daily.      . benazepril (LOTENSIN) 40 MG tablet Take 0.5 tablets (20 mg total) by mouth daily. 45 tablet 3  . Calcium Carbonate (CALTRATE 600) 1500 MG TABS Take 1 tablet by mouth 2 (two) times daily.     . fenofibrate micronized (LOFIBRA) 134 MG capsule TAKE 1 CAPSULE (134 MG TOTAL) BY MOUTH DAILY. 90 capsule 3  . gabapentin (NEURONTIN) 100 MG capsule Take 1 capsule (100 mg total) by mouth at bedtime. 90 capsule 3  . Multiple Vitamin (MULTIVITAMIN) capsule Take 1 capsule by mouth daily. Centrum Silver    . oxybutynin (DITROPAN XL) 5 MG 24 hr tablet 1 tab by mouth daily 90 tablet 3  . pentoxifylline (TRENTAL) 400 MG CR tablet TAKE 1 TABLET (400 MG TOTAL) BY MOUTH DAILY. 90 tablet 3   No current facility-administered medications for this visit.    ROS: See HPI for pertinent positives and negatives.   Physical Examination  Filed Vitals:   10/14/15 1007 10/14/15 1010  BP: 120/72 113/64  Pulse: 76 76  Temp: 97.5 F (36.4 C)   TempSrc: Oral   Resp: 12   Height:  (1.499 m)   Weight: 107 lb (48.535 kg)   SpO2: 98%    Body mass index is 21.6 kg/(m^2).  General: WDWN in NAD Gait: Slow and deliberate, using a rolling walker HENT: WNL Eyes: Pupils are equal. Pulmonary: normal non-labored breathing , no rales, rhonchi, or wheezing Cardiac: Regular rhythm, nodetected murmur. No carotid bruits Abdomen: soft, NT, no palpable masses Skin: no rashes, no ulcers, no cellulitis.   VASCULAR EXAM: Extremities without ischemic changes  without Gangrene; without open wounds.Left toes 2-4 are slightly dark at dorsal aspect, not on plantar surface, no change from previous visit.       LE  Pulses Left RIGHT   FEMORAL 1+palpable not palpable    POPLITEAL Not palpable not palpable   POSTERIOR TIBIAL Not palpable not palpable    DORSALIS PEDIS  ANTERIOR TIBIAL Not palpable not palpable      Musculoskeletal: mild generalized muscle wasting/atrophy; no edema Neurologic: A&O X 3; Appropriate Affect ;  MOTOR FUNCTION: 5/5 Symmetric, CN 2-12 intact Speech is fluent/normal   Significant Diagnostic Studies: Head CT of 02/23/13:  Remote moderate to large size occipital lobe infarcts with  encephalomalacia more notable on the left. Remote appearing moderate  size left cerebellar infarct.  Small infarcts of the thalamus bilaterally, caudate head bilaterally  and right paracentral pontine region of questionable age.  Prior MR made reference to bilateral occipital lobe, bilateral  caudate head and thalamic infarcts.  Small vessel disease type changes.  No intracranial enhancing lesion.  No intracranial hemorrhage.  Global atrophy without hydrocephalus.  Prominent vascular calcifications with narrowing of the vertebral  arteries and carotid arteries.                    Aortoiliac Duplex (04/01/15): Patent bilateral iliac artery stents with greater than 50% stenosis of the bilateral proximal/mid external iliac arteries. No significant stenosis was noted in the remainder of the aortoiliac system, based on limited visualization due to overlying bowel gas. Greater than 50% bilateral EIA stenoses now noted when compared to 03/26/14.   Non-Invasive Vascular Imaging: DATE: 10/06/15 ABI:  RIGHT: 0.49 (0.48, 04/01/15), Waveforms: monophasic; TBI: 0.51, toe pressure: 65   LEFT: 0.40 (0.47), Waveforms: monophasic DP and peroneal, absent PT, TBI: 0.36, toe pressure: 46  ASSESSMENT: Tracy [name] is a 82 y.o. female who is status post bilateral common iliac and left external  iliac stents placed in 2011. She has a remote history of left CEA. She has a history of stroke in 2004 with no residual neurological deficit. She had another stroke on 07/01/14 as manifested by left leg and left hand numbness, and left leg weakness and stiffness; she fell once trying to walk. [facility], evaluated, states she was told she had a stroke.  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  No subsequent strokes or TIA's.  04/01/15 carotid duplex suggested mild/moderate right ICA stenosis and no restenosis of the left CEA site.  ABI's today remain stable with severe arterial occlusive disease bilaterally, monophasic waveforms, absent left PT. Pt has no signs of ischemia in her feet/legs, no ulcers.  She does not seem to walk enough to elicit claudication symptoms. She walks with a walker.    PLAN:  Based on the patient's vascular studies and examination, pt will return to clinic in 6 months with ABI's, bilateral iliac stent duplex, and carotid duplex, see me afterward.  Daily seated leg exercises discussed and demonstrated in lieu of graduated walking program.   I discussed in depth with the patient the nature of atherosclerosis, and emphasized the importance of maximal medical management including strict control of blood pressure, blood glucose, and lipid levels, obtaining regular exercise, and continued cessation of smoking.  The patient is aware that without maximal medical management the underlying atherosclerotic disease process will progress, limiting the benefit of any interventions.  The patient was given information about PAD including signs, symptoms, treatment, what symptoms should prompt the patient to seek immediate medical care, and risk reduction measures to take.  Charisse MarchSuzanne Deondray Ospina, RN, MSN, FNP-C [facility] [place] Office Phone: [phone]  Clinic MD:  Early  10/14/2015 10:15 AM

## 2015-10-14 NOTE — Patient Instructions (Signed)
Peripheral Vascular Disease Peripheral vascular disease (PVD) is a disease of the blood vessels that are not part of your heart and brain. A simple term for PVD is poor circulation. In most cases, PVD narrows the blood vessels that carry blood from your heart to the rest of your body. This can result in a decreased supply of blood to your arms, legs, and internal organs, like your stomach or kidneys. However, it most often affects a person's lower legs and feet. There are two types of PVD.  Organic PVD. This is the more common type. It is caused by damage to the structure of blood vessels.  Functional PVD. This is caused by conditions that make blood vessels contract and tighten (spasm). Without treatment, PVD tends to get worse over time. PVD can also lead to acute ischemic limb. This is when an arm or limb suddenly has trouble getting enough blood. This is a medical emergency. CAUSES Each type of PVD has many different causes. The most common cause of PVD is buildup of a fatty material (plaque) inside of your arteries (atherosclerosis). Small amounts of plaque can break off from the walls of the blood vessels and become lodged in a smaller artery. This blocks blood flow and can cause acute ischemic limb. Other common causes of PVD include:  Blood clots that form inside of blood vessels.  Injuries to blood vessels.  Diseases that cause inflammation of blood vessels or cause blood vessel spasms.  Health behaviors and health history that increase your risk of developing PVD. RISK FACTORS  You may have a greater risk of PVD if you:  Have a family history of PVD.  Have certain medical conditions, including:  High cholesterol.  Diabetes.  High blood pressure (hypertension).  Coronary heart disease.  Past problems with blood clots.  Past injury, such as burns or a broken bone. These may have damaged blood vessels in your limbs.  Buerger disease. This is caused by inflamed blood  vessels in your hands and feet.  Some forms of arthritis.  Rare birth defects that affect the arteries in your legs.  Use tobacco.  Do not get enough exercise.  Are obese.  Are age 50 or older. SIGNS AND SYMPTOMS  PVD may cause many different symptoms. Your symptoms depend on what part of your body is not getting enough blood. Some common signs and symptoms include:  Cramps in your lower legs. This may be a symptom of poor leg circulation (claudication).  Pain and weakness in your legs while you are physically active that goes away when you rest (intermittent claudication).  Leg pain when at rest.  Leg numbness, tingling, or weakness.  Coldness in a leg or foot, especially when compared with the other leg.  Skin or hair changes. These can include:  Hair loss.  Shiny skin.  Pale or bluish skin.  Thick toenails.  Inability to get or maintain an erection (erectile dysfunction). People with PVD are more prone to developing ulcers and sores on their toes, feet, or legs. These may take longer than normal to heal. DIAGNOSIS Your health care provider may diagnose PVD from your signs and symptoms. The health care provider will also do a physical exam. You may have tests to find out what is causing your PVD and determine its severity. Tests may include:  Blood pressure recordings from your arms and legs and measurements of the strength of your pulses (pulse volume recordings).  Imaging studies using sound waves to take pictures of   the blood flow through your blood vessels (Doppler ultrasound).  Injecting a dye into your blood vessels before having imaging studies using:  X-rays (angiogram or arteriogram).  Computer-generated X-rays (CT angiogram).  A powerful electromagnetic field and a computer (magnetic resonance angiogram or MRA). TREATMENT Treatment for PVD depends on the cause of your condition and the severity of your symptoms. It also depends on your age. Underlying  causes need to be treated and controlled. These include long-lasting (chronic) conditions, such as diabetes, high cholesterol, and high blood pressure. You may need to first try making lifestyle changes and taking medicines. Surgery may be needed if these do not work. Lifestyle changes may include:  Quitting smoking.  Exercising regularly.  Following a low-fat, low-cholesterol diet. Medicines may include:  Blood thinners to prevent blood clots.  Medicines to improve blood flow.  Medicines to improve your blood cholesterol levels. Surgical procedures may include:  A procedure that uses an inflated balloon to open a blocked artery and improve blood flow (angioplasty).  A procedure to put in a tube (stent) to keep a blocked artery open (stent implant).  Surgery to reroute blood flow around a blocked artery (peripheral bypass surgery).  Surgery to remove dead tissue from an infected wound on the affected limb.  Amputation. This is surgical removal of the affected limb. This may be necessary in cases of acute ischemic limb that are not improved through medical or surgical treatments. HOME CARE INSTRUCTIONS  Take medicines only as directed by your health care provider.  Do not use any tobacco products, including cigarettes, chewing tobacco, or electronic cigarettes. If you need help quitting, ask your health care provider.  Lose weight if you are overweight, and maintain a healthy weight as directed by your health care provider.  Eat a diet that is low in fat and cholesterol. If you need help, ask your health care provider.  Exercise regularly. Ask your health care provider to suggest some good activities for you.  Use compression stockings or other mechanical devices as directed by your health care provider.  Take good care of your feet.  Wear comfortable shoes that fit well.  Check your feet often for any cuts or sores. SEEK MEDICAL CARE IF:  You have cramps in your legs  while walking.  You have leg pain when you are at rest.  You have coldness in a leg or foot.  Your skin changes.  You have erectile dysfunction.  You have cuts or sores on your feet that are not healing. SEEK IMMEDIATE MEDICAL CARE IF:  Your arm or leg turns cold and blue.  Your arms or legs become red, warm, swollen, painful, or numb.  You have chest pain or trouble breathing.  You suddenly have weakness in your face, arm, or leg.  You become very confused or lose the ability to speak.  You suddenly have a very bad headache or lose your vision.   This information is not intended to replace advice given to you by your health care provider. Make sure you discuss any questions you have with your health care provider.   Document Released: 04/29/2004 Document Revised: 04/12/2014 Document Reviewed: 08/30/2013 Elsevier Interactive Patient Education 2016 [employer]  

## 2015-10-15 ENCOUNTER — Ambulatory Visit: Payer: Medicare Other | Admitting: Internal Medicine

## 2015-10-16 ENCOUNTER — Ambulatory Visit: Payer: Medicare Other | Admitting: Family Medicine

## 2015-10-22 ENCOUNTER — Ambulatory Visit (INDEPENDENT_AMBULATORY_CARE_PROVIDER_SITE_OTHER): Payer: Medicare Other | Admitting: Family Medicine

## 2015-10-22 ENCOUNTER — Other Ambulatory Visit: Payer: Self-pay

## 2015-10-22 ENCOUNTER — Encounter: Payer: Self-pay | Admitting: Family Medicine

## 2015-10-22 VITALS — BP 118/78 | HR 68 | Wt 106.0 lb

## 2015-10-22 DIAGNOSIS — M25511 Pain in right shoulder: Secondary | ICD-10-CM

## 2015-10-22 DIAGNOSIS — M12811 Other specific arthropathies, not elsewhere classified, right shoulder: Secondary | ICD-10-CM

## 2015-10-22 DIAGNOSIS — M75101 Unspecified rotator cuff tear or rupture of right shoulder, not specified as traumatic: Secondary | ICD-10-CM

## 2015-10-22 NOTE — Assessment & Plan Note (Signed)
Patient given injection today and tolerated the procedure well. We discussed icing regimen and home exercises. We discussed which activities doing which ones to avoid. Patient and will come back and see me again in 10-12 weeks if another injection is necessary or if the contralateral side starts hurting her.  Spent  25 minutes with patient face-to-face and had greater than 50% of counseling including as described above in assessment and plan.

## 2015-10-22 NOTE — Progress Notes (Signed)
Tracy [name] [address] [place], [place] [address] Phone: [phone] Subjective:     CC: Bilateral shoulder pain Follow-up  BJY:NWGNFAOZHY Tracy [name] is a 82 y.o. female coming in with complaint of bilateral shoulder pain. . Does have known severe osteoarthritic changes of the shoulders bilaterally with rotator cuff arthropathy. 10 bilateral injections 10 weeks ago. Starting have worsening symptoms again on the right shoulder only. Patient's at the left shoulder seems to be doing relatively well. Patient continues to try to be fairly active. Patient's blood sugars have been well controlled.  Past Medical History  Diagnosis Date  . Hyperlipidemia   . HTN (hypertension)   . GERD (gastroesophageal reflux disease)   . Osteopenia   . Hx of adenomatous colonic polyps 2007  . Diverticulosis of colon   . DM (diabetes mellitus), type 2 (HCC)   . PVD (peripheral vascular disease) (HCC)     recently dx w/ carotid -left  int carotid >90% 50-60% rt int carotid 02/2010  . PVD (peripheral vascular disease) (HCC)     lf  ABI 0.11 and rt 0.43  . CAROTID STENOSIS   . DIABETES MELLITUS, TYPE II   . DIVERTICULOSIS, COLON   . GERD   . HYPERLIPIDEMIA   . OSTEOPENIA   . Atrial fibrillation (HCC)   . DVT (deep venous thrombosis) (HCC)   . Sinus node dysfunction (HCC) 2011  . Hypertension   . CVA (cerebrovascular accident) (HCC) 2002  . Stroke [facility]) July 01, 2014    Affected Left side   Past Surgical History  Procedure Laterality Date  . Cns vascular stent per neurosurgeon    . Tubal ligation    . Right shoulder surgery  2010  . Cataract eye surgery    . Lazer eye surgery    . Iliac artery stent  2011    Bilateral CIA stent  . Carotid endarterectomy    . Eye surgery      No Known Allergies Family History  Problem Relation Age of Onset  . Diabetes Sister     Amputation  . Cancer Mother     colon        Past medical history, social, surgical  and family history all reviewed in electronic medical record.   Review of Systems: No headache, visual changes, nausea, vomiting, diarrhea, constipation, dizziness, abdominal pain, skin rash, fevers, chills, night sweats, weight loss, swollen lymph nodes.  Objective Blood pressure 118/78, pulse 68, weight 106 lb (48.081 kg).  General: No apparent distress alert and oriented x3 mood and affect normal, dressed appropriately. Frail HEENT: Pupils equal, extraocular movements intact  Respiratory: Patient's speak in full sentences and does not appear short of breath  Cardiovascular: No lower extremity edema, non tender, no erythema  Skin: Warm dry intact with no signs of infection or rash on extremities or on axial skeleton.  Abdomen: Soft nontender  Neuro: Cranial nerves II through XII are intact, neurovascularly intact in all extremities with 2+ DTRs and 2+ pulses.  Lymph: No lymphadenopathy of posterior or anterior cervical chain or axillae bilaterally.  Gait slow gait with some shoveling.  MSK:  Non tender with full range of motion and good stability and symmetric strength and tone of shoulders, elbows, wrist, hip, knee and ankles bilaterally. Patient though is very frail and does have osteoarthritic changes of multiple joints. Shoulder: Bilateral Inspection reveals significant atrophy of the musculature bilaterally Decreased ROM 160 Forward flexion, Patient has some tendon degrees of  external rotation and internal rotation to sacrum more tenderness of the right shoulder Rotator cuff strength 3/5.seems stable Positive signs of impingement bilaterally right greater than left Speeds and Yergason's tests normal. No labral pathology noted with negative Obrien's, negative clunk and good stability. Normal scapular function observed. painful arc and positive drop arm sign.   Procedure: Real-time Ultrasound Guided Injection of right glenohumeral joint Device: GE Logiq E  Ultrasound guided  injection is preferred based studies that show increased duration, increased effect, greater accuracy, decreased procedural pain, increased response rate with ultrasound guided versus blind injection.  Verbal informed consent obtained.  Time-out conducted.  Noted no overlying erythema, induration, or other signs of local infection.  Skin prepped in a sterile fashion.  Local anesthesia: Topical Ethyl chloride.  With sterile technique and under real time ultrasound guidance:  Joint visualized.  23g 1  inch needle inserted posterior approach. Pictures taken for needle placement. Patient did have injection of 2 cc of 1% lidocaine, 2 cc of 0.5% Marcaine, and 1.0 cc of Kenalog 40 mg/dL. Completed without difficulty  Pain immediately resolved suggesting accurate placement of the medication.  Advised to call if fevers/chills, erythema, induration, drainage, or persistent bleeding.  Images permanently stored and available for review in the ultrasound unit.  Impression: Technically successful ultrasound guided injection.     Impression and Recommendations:     This case required medical decision making of moderate complexity.

## 2015-10-22 NOTE — Patient Instructions (Signed)
Good to see you  Ice when you need it Keep trucking along See you again in 10 weeks!

## 2015-10-26 ENCOUNTER — Other Ambulatory Visit: Payer: Self-pay | Admitting: Internal Medicine

## 2015-11-18 ENCOUNTER — Encounter: Payer: Self-pay | Admitting: Internal Medicine

## 2015-11-18 ENCOUNTER — Ambulatory Visit (INDEPENDENT_AMBULATORY_CARE_PROVIDER_SITE_OTHER): Payer: Medicare Other | Admitting: Internal Medicine

## 2015-11-18 VITALS — BP 138/82 | HR 63 | Temp 98.4°F | Resp 20 | Wt 108.0 lb

## 2015-11-18 DIAGNOSIS — E785 Hyperlipidemia, unspecified: Secondary | ICD-10-CM | POA: Diagnosis not present

## 2015-11-18 DIAGNOSIS — M79605 Pain in left leg: Secondary | ICD-10-CM

## 2015-11-18 DIAGNOSIS — E119 Type 2 diabetes mellitus without complications: Secondary | ICD-10-CM

## 2015-11-18 DIAGNOSIS — I10 Essential (primary) hypertension: Secondary | ICD-10-CM

## 2015-11-18 DIAGNOSIS — M5431 Sciatica, right side: Secondary | ICD-10-CM | POA: Insufficient documentation

## 2015-11-18 DIAGNOSIS — I63311 Cerebral infarction due to thrombosis of right middle cerebral artery: Secondary | ICD-10-CM

## 2015-11-18 MED ORDER — ATORVASTATIN CALCIUM 10 MG PO TABS: ORAL_TABLET | ORAL | 3 refills | Status: DC

## 2015-11-18 MED ORDER — GABAPENTIN 300 MG PO CAPS: 300.0000 mg | ORAL_CAPSULE | Freq: Every day | ORAL | 1 refills | Status: DC

## 2015-11-18 NOTE — Assessment & Plan Note (Signed)
To cont asa, also add low statin lipitor 10 - 1/2 qd due to last lDL at 55 in feb 2017

## 2015-11-18 NOTE — Progress Notes (Signed)
Subjective:    Patient ID: Tracy [name], female    DOB: [birth year], 82 y.o.   MRN: [id number]  HPI  Here to f/u; overall doing ok,  Pt denies chest pain, increasing sob or doe, wheezing, orthopnea, PND, increased LE swelling, palpitations, dizziness or syncope.  Pt denies new neurological symptoms such as new headache, or facial or extremity weakness or numbness, has ongoing left sided weakness polst CVA,  Not taking statin..  Pt denies polydipsia, polyuria, or low sugar episode.   Pt denies new neurological symptoms such as new headache, or facial or extremity weakness or numbness.   Pt states overall good compliance with meds, mostly trying to follow appropriate diet, with wt overall stable,  but little exercise however. Walks with cane, no falls, and states has left leg pain, whole leg all the way down, had been just numbness now painful for several weaks, worse to sit for longer time and lyin down at night. Gabapentin 100 not helping enough  No lower back pain Past Medical History:  Diagnosis Date  . Atrial fibrillation (HCC)   . CAROTID STENOSIS   . CVA (cerebrovascular accident) (HCC) 2002  . DIABETES MELLITUS, TYPE II   . Diverticulosis of colon   . DIVERTICULOSIS, COLON   . DM (diabetes mellitus), type 2 (HCC)   . DVT (deep venous thrombosis) (HCC)   . GERD   . GERD (gastroesophageal reflux disease)   . HTN (hypertension)   . Hx of adenomatous colonic polyps 2007  . Hyperlipidemia   . HYPERLIPIDEMIA   . Hypertension   . Osteopenia   . OSTEOPENIA   . PVD (peripheral vascular disease) (HCC)    recently dx w/ carotid -left  int carotid >90% 50-60% rt int carotid 02/2010  . PVD (peripheral vascular disease) (HCC)    lf  ABI 0.11 and rt 0.43  . Sinus node dysfunction (HCC) 2011  . Stroke [facility](HCC) July 01, 2014   Affected Left side   Past Surgical History:  Procedure Laterality Date  . CAROTID ENDARTERECTOMY    . cataract eye surgery    . CNS vascular stent per Neurosurgeon     . EYE SURGERY    . ILIAC ARTERY STENT  2011   Bilateral CIA stent  . lazer eye surgery    . Right Shoulder Surgery  2010  . TUBAL LIGATION      reports that she has never smoked. She has never used smokeless tobacco. She reports that she does not drink alcohol or use drugs. family history includes Cancer in her mother; Diabetes in her sister. No Known Allergies Current Outpatient Prescriptions on File Prior to Visit  Medication Sig Dispense Refill  . amLODipine (NORVASC) 5 MG tablet TAKE 1 TABLET (5 MG TOTAL) BY MOUTH DAILY. 90 tablet 3  . aspirin 81 MG tablet Take 81 mg by mouth daily.      . benazepril (LOTENSIN) 40 MG tablet Take 0.5 tablets (20 mg total) by mouth daily. 45 tablet 3  . Calcium Carbonate (CALTRATE 600) 1500 MG TABS Take 1 tablet by mouth 2 (two) times daily.     . fenofibrate micronized (LOFIBRA) 134 MG capsule TAKE 1 CAPSULE (134 MG TOTAL) BY MOUTH DAILY. 90 capsule 3  . gabapentin (NEURONTIN) 100 MG capsule Take 1 capsule (100 mg total) by mouth at bedtime. 90 capsule 3  . Multiple Vitamin (MULTIVITAMIN) capsule Take 1 capsule by mouth daily. Centrum Silver    . oxybutynin (DITROPAN-XL) 5 MG 24  hr tablet TAKE 1 TABLET BY MOUTH EVERY DAY 90 tablet 3  . pentoxifylline (TRENTAL) 400 MG CR tablet TAKE 1 TABLET (400 MG TOTAL) BY MOUTH DAILY. 90 tablet 3   No current facility-administered medications on file prior to visit.    Review of Systems  Constitutional: Negative for unusual diaphoresis or night sweats HENT: Negative for ear swelling or discharge Eyes: Negative for worsening visual haziness  Respiratory: Negative for choking and stridor.   Gastrointestinal: Negative for distension or worsening eructation Genitourinary: Negative for retention or change in urine volume.  Musculoskeletal: Negative for other MSK pain or swelling Skin: Negative for color change and worsening wound Neurological: Negative for tremors and numbness other than noted    Psychiatric/Behavioral: Negative for decreased concentration or agitation other than above       Objective:   Physical Exam BP 138/82   Pulse 63   Temp 98.4 F (36.9 C) (Oral)   Resp 20   Wt 108 lb (49 kg)   SpO2 97%   BMI 21.81 kg/m  VS noted,  Constitutional: Pt appears in no apparent distress HENT: Head: NCAT.  Right Ear: External ear normal.  Left Ear: External ear normal.  Eyes: . Pupils are equal, round, and reactive to light. Conjunctivae and EOM are normal Neck: Normal range of motion. Neck supple.  Cardiovascular: Normal rate and regular rhythm.   Pulmonary/Chest: Effort normal and breath sounds without rales or wheezing.  Abd:  Soft, NT, ND, + BS Neurological: Pt is alert. Not confused , motor grossly intact except left sided weakness stable Skin: Skin is warm. No rash, no LE edema Psychiatric: Pt behavior is normal. No agitation.      Assessment & Plan:

## 2015-11-18 NOTE — Patient Instructions (Signed)
Ok to increase the gabapentin at night to 300 mg   Please take all new medication as prescribed - the HALF of the lipitor 10 mg   Please continue all other medications as before, and refills have been done if requested.  Please have the pharmacy call with any other refills you may need.  Please continue your efforts at being more active, low cholesterol diet, and weight control.  You are otherwise up to date with prevention measures today.  Please keep your appointments with your specialists as you may have planned  Please return in 6 months, or sooner if needed, with Lab testing done 3-5 days before

## 2015-11-18 NOTE — Assessment & Plan Note (Signed)
stable overall by history and exam, recent data reviewed with pt, and pt to continue medical treatment as before,  to f/u any worsening symptoms or concerns BP Readings from Last 3 Encounters:  11/18/15 138/82  10/22/15 118/78  10/14/15 113/64

## 2015-11-18 NOTE — Assessment & Plan Note (Signed)
No LBP and has hx of PVD, but suspect possibly more neuritic - for incresaed gabapentin 300 qhs

## 2015-11-18 NOTE — Assessment & Plan Note (Signed)
stable overall by history and exam, recent data reviewed with pt, and pt to continue medical treatment as before,  to f/u any worsening symptoms or concerns Lab Results  Component Value Date   HGBA1C 5.7 05/27/2015   Pt declines further lab today as she was bruised at last lab visit

## 2015-11-18 NOTE — Progress Notes (Signed)
Pre visit review using our clinic review tool, if applicable. No additional management support is needed unless otherwise documented below in the visit note. 

## 2015-11-18 NOTE — Assessment & Plan Note (Signed)
For start 1/2 of 10 mg lipitor qd with hx of cva, o/w stable overall by history and exam, recent data reviewed with pt, and pt to continue medical treatment as before,  to f/u any worsening symptoms or concerns Lab Results  Component Value Date   LDLCALC 53 05/27/2015

## 2015-12-07 ENCOUNTER — Other Ambulatory Visit: Payer: Self-pay | Admitting: Internal Medicine

## 2015-12-30 DIAGNOSIS — H35461 Secondary vitreoretinal degeneration, right eye: Secondary | ICD-10-CM | POA: Diagnosis not present

## 2015-12-30 DIAGNOSIS — H53461 Homonymous bilateral field defects, right side: Secondary | ICD-10-CM | POA: Diagnosis not present

## 2015-12-30 DIAGNOSIS — H35352 Cystoid macular degeneration, left eye: Secondary | ICD-10-CM | POA: Diagnosis not present

## 2016-01-05 ENCOUNTER — Ambulatory Visit: Payer: Medicare Other | Admitting: Family Medicine

## 2016-01-05 NOTE — Progress Notes (Signed)
Tracy [name] [address] [place], [place] [address] Phone: [phone] Subjective:     CC: Bilateral shoulder pain Follow-up Has been a chronic issue with rotator cuff arthritic changes.  BJY:NWGNFAOZHY  Tracy [name] is a 82 y.o. female coming in with complaint of bilateral shoulder pain. . Does have known severe osteoarthritic changes of the shoulders bilaterally with rotator cuff arthropathy. Right shoulder was injected 10 weeks ago.  Patient states  Worsening symptoms in the left shoulder.  Worse with ADLs and sleeping as well.  Son has to treat her.    Past Medical History:  Diagnosis Date  . Atrial fibrillation (HCC)   . CAROTID STENOSIS   . CVA (cerebrovascular accident) (HCC) 2002  . DIABETES MELLITUS, TYPE II   . Diverticulosis of colon   . DIVERTICULOSIS, COLON   . DM (diabetes mellitus), type 2 (HCC)   . DVT (deep venous thrombosis) (HCC)   . GERD   . GERD (gastroesophageal reflux disease)   . HTN (hypertension)   . Hx of adenomatous colonic polyps 2007  . Hyperlipidemia   . HYPERLIPIDEMIA   . Hypertension   . Osteopenia   . OSTEOPENIA   . PVD (peripheral vascular disease) (HCC)    recently dx w/ carotid -left  int carotid >90% 50-60% rt int carotid 02/2010  . PVD (peripheral vascular disease) (HCC)    lf  ABI 0.11 and rt 0.43  . Sinus node dysfunction (HCC) 2011  . Stroke [facility]) July 01, 2014   Affected Left side   Past Surgical History:  Procedure Laterality Date  . CAROTID ENDARTERECTOMY    . cataract eye surgery    . CNS vascular stent per Neurosurgeon    . EYE SURGERY    . ILIAC ARTERY STENT  2011   Bilateral CIA stent  . lazer eye surgery    . Right Shoulder Surgery  2010  . TUBAL LIGATION      No Known Allergies Family History  Problem Relation Age of Onset  . Diabetes Sister     Amputation  . Cancer Mother     colon        Past medical history, social, surgical and family history all reviewed in  electronic medical record.   Review of Systems: No headache, visual changes, nausea, vomiting, diarrhea, constipation, dizziness, abdominal pain, skin rash, fevers, chills, night sweats, weight loss, swollen lymph nodes.  Objective  Blood pressure 130/82, pulse 84, weight 105 lb (47.6 kg), SpO2 97 %.  General: No apparent distress alert and oriented x3 mood and affect normal, dressed appropriately. Frail HEENT: Pupils equal, extraocular movements intact  Respiratory: Patient's speak in full sentences and does not appear short of breath  Cardiovascular: No lower extremity edema, non tender, no erythema  Skin: Warm dry intact with no signs of infection or rash on extremities or on axial skeleton.  Abdomen: Soft nontender  Neuro: Cranial nerves II through XII are intact, neurovascularly intact in all extremities with 2+ DTRs and 2+ pulses.  Lymph: No lymphadenopathy of posterior or anterior cervical chain or axillae bilaterally.  Gait slow gait with some shoveling.  MSK:  Non tender with full range of motion and good stability and symmetric strength and tone of shoulders, elbows, wrist, hip, knee and ankles bilaterally. Patient though is very frail and does have osteoarthritic changes of multiple joints. Shoulder: Bilateral Inspection reveals significant atrophy of the musculature bilaterally Decreased ROM 130 Forward flexion on left that is  worse. , Patient has 5 degrees of external rotation and internal rotation to sacrum Rotator cuff strength 3/5.seems stable Positive signs of impingement bilaterally right greater than left Speeds and Yergason's tests normal. No labral pathology noted with negative Obrien's, negative clunk and good stability. Normal scapular function observed. painful arc and positive drop arm sign.   Procedure: Real-time Ultrasound Guided Injection of left glenohumeral joint Device: GE Logiq E  Ultrasound guided injection is preferred based studies that show increased  duration, increased effect, greater accuracy, decreased procedural pain, increased response rate with ultrasound guided versus blind injection.  Verbal informed consent obtained.  Time-out conducted.  Noted no overlying erythema, induration, or other signs of local infection.  Skin prepped in a sterile fashion.  Local anesthesia: Topical Ethyl chloride.  With sterile technique and under real time ultrasound guidance:  Joint visualized.  23g 1  inch needle inserted posterior approach. Pictures taken for needle placement. Patient did have injection of 2 cc of 1% lidocaine, 2 cc of 0.5% Marcaine, and 1.0 cc of Kenalog 40 mg/dL. Completed without difficulty  Pain immediately resolved suggesting accurate placement of the medication.  Advised to call if fevers/chills, erythema, induration, drainage, or persistent bleeding.  Images permanently stored and available for review in the ultrasound unit.  Impression: Technically successful ultrasound guided injection.     Impression and Recommendations:     This case required medical decision making of moderate complexity.

## 2016-01-06 ENCOUNTER — Other Ambulatory Visit: Payer: Self-pay

## 2016-01-06 ENCOUNTER — Encounter: Payer: Self-pay | Admitting: Family Medicine

## 2016-01-06 ENCOUNTER — Ambulatory Visit (INDEPENDENT_AMBULATORY_CARE_PROVIDER_SITE_OTHER): Payer: Medicare Other | Admitting: Family Medicine

## 2016-01-06 VITALS — BP 130/82 | HR 84 | Wt 105.0 lb

## 2016-01-06 DIAGNOSIS — M25511 Pain in right shoulder: Secondary | ICD-10-CM | POA: Diagnosis not present

## 2016-01-06 DIAGNOSIS — M12812 Other specific arthropathies, not elsewhere classified, left shoulder: Secondary | ICD-10-CM | POA: Diagnosis not present

## 2016-01-06 DIAGNOSIS — M75102 Unspecified rotator cuff tear or rupture of left shoulder, not specified as traumatic: Secondary | ICD-10-CM

## 2016-01-06 DIAGNOSIS — M25512 Pain in left shoulder: Secondary | ICD-10-CM | POA: Diagnosis not present

## 2016-01-06 DIAGNOSIS — G8929 Other chronic pain: Secondary | ICD-10-CM

## 2016-01-06 NOTE — Patient Instructions (Addendum)
Good to see you  Keep trucking along.  We will keep doing what we are doing.  Continue gabapentin  See you again in 10 weeks.

## 2016-01-06 NOTE — Assessment & Plan Note (Signed)
Injected again today  Failed all other conservative therapy does not want surgery.  Worsening symptoms today  RTC in 10 weeks.

## 2016-01-09 ENCOUNTER — Telehealth: Payer: Self-pay | Admitting: Internal Medicine

## 2016-01-09 NOTE — Telephone Encounter (Signed)
Pt called in and wants to know if a letter can be typed out for the mail lady to put pt mail in her po office box.

## 2016-01-09 NOTE — Telephone Encounter (Signed)
Ok for letter to state pt is unable to ambulate  (walk) to the street for getting her daily mail  Please ask Post Office delivery to be done at her front door instead

## 2016-01-12 NOTE — Telephone Encounter (Signed)
Done

## 2016-01-12 NOTE — Progress Notes (Signed)
A user error has taken place.

## 2016-01-14 NOTE — Progress Notes (Signed)
Letter done

## 2016-01-22 ENCOUNTER — Other Ambulatory Visit: Payer: Medicare Other

## 2016-01-22 DIAGNOSIS — G8929 Other chronic pain: Secondary | ICD-10-CM

## 2016-01-22 DIAGNOSIS — M25511 Pain in right shoulder: Principal | ICD-10-CM

## 2016-01-22 DIAGNOSIS — M25512 Pain in left shoulder: Principal | ICD-10-CM

## 2016-01-23 DIAGNOSIS — M5136 Other intervertebral disc degeneration, lumbar region: Secondary | ICD-10-CM | POA: Diagnosis not present

## 2016-01-23 DIAGNOSIS — M5416 Radiculopathy, lumbar region: Secondary | ICD-10-CM | POA: Diagnosis not present

## 2016-02-02 ENCOUNTER — Telehealth: Payer: Self-pay | Admitting: Internal Medicine

## 2016-02-02 NOTE — Telephone Encounter (Signed)
New message  Son calling for mother  Left ankle swelling, fatiqued  Sleeps a lot  Started a week ago, very low energy

## 2016-02-02 NOTE — Telephone Encounter (Signed)
I spoke with the pt since her son just left the house to go to the store.  I advised her that she needs to follow-up with her PCP as scheduled on 02/04/16 for further evaluation of symptoms. She agreed with plan.

## 2016-02-04 ENCOUNTER — Observation Stay (HOSPITAL_COMMUNITY)
Admission: EM | Admit: 2016-02-04 | Discharge: 2016-02-07 | Disposition: A | Discharge status: Home Health | Payer: Medicare Other | Attending: Family Medicine | Admitting: Family Medicine

## 2016-02-04 ENCOUNTER — Observation Stay (HOSPITAL_COMMUNITY): Payer: Medicare Other

## 2016-02-04 ENCOUNTER — Emergency Department (HOSPITAL_COMMUNITY): Payer: Medicare Other

## 2016-02-04 ENCOUNTER — Ambulatory Visit (INDEPENDENT_AMBULATORY_CARE_PROVIDER_SITE_OTHER): Payer: Medicare Other | Admitting: Internal Medicine

## 2016-02-04 ENCOUNTER — Encounter (HOSPITAL_COMMUNITY): Payer: Self-pay | Admitting: *Deleted

## 2016-02-04 ENCOUNTER — Encounter: Payer: Self-pay | Admitting: Internal Medicine

## 2016-02-04 VITALS — BP 118/70 | HR 96 | Temp 98.8°F | Resp 20

## 2016-02-04 DIAGNOSIS — R471 Dysarthria and anarthria: Secondary | ICD-10-CM | POA: Diagnosis not present

## 2016-02-04 DIAGNOSIS — D649 Anemia, unspecified: Secondary | ICD-10-CM

## 2016-02-04 DIAGNOSIS — I481 Persistent atrial fibrillation: Secondary | ICD-10-CM | POA: Insufficient documentation

## 2016-02-04 DIAGNOSIS — M7989 Other specified soft tissue disorders: Secondary | ICD-10-CM | POA: Diagnosis not present

## 2016-02-04 DIAGNOSIS — K219 Gastro-esophageal reflux disease without esophagitis: Secondary | ICD-10-CM | POA: Diagnosis not present

## 2016-02-04 DIAGNOSIS — M5116 Intervertebral disc disorders with radiculopathy, lumbar region: Secondary | ICD-10-CM | POA: Diagnosis not present

## 2016-02-04 DIAGNOSIS — I499 Cardiac arrhythmia, unspecified: Secondary | ICD-10-CM | POA: Diagnosis not present

## 2016-02-04 DIAGNOSIS — E785 Hyperlipidemia, unspecified: Secondary | ICD-10-CM | POA: Insufficient documentation

## 2016-02-04 DIAGNOSIS — E1151 Type 2 diabetes mellitus with diabetic peripheral angiopathy without gangrene: Secondary | ICD-10-CM | POA: Diagnosis not present

## 2016-02-04 DIAGNOSIS — I10 Essential (primary) hypertension: Secondary | ICD-10-CM | POA: Diagnosis not present

## 2016-02-04 DIAGNOSIS — W19XXXA Unspecified fall, initial encounter: Secondary | ICD-10-CM | POA: Diagnosis not present

## 2016-02-04 DIAGNOSIS — D508 Other iron deficiency anemias: Secondary | ICD-10-CM

## 2016-02-04 DIAGNOSIS — Z8673 Personal history of transient ischemic attack (TIA), and cerebral infarction without residual deficits: Secondary | ICD-10-CM | POA: Insufficient documentation

## 2016-02-04 DIAGNOSIS — R531 Weakness: Secondary | ICD-10-CM

## 2016-02-04 DIAGNOSIS — M79604 Pain in right leg: Secondary | ICD-10-CM | POA: Diagnosis not present

## 2016-02-04 DIAGNOSIS — I4891 Unspecified atrial fibrillation: Secondary | ICD-10-CM | POA: Insufficient documentation

## 2016-02-04 DIAGNOSIS — N179 Acute kidney failure, unspecified: Secondary | ICD-10-CM | POA: Diagnosis not present

## 2016-02-04 DIAGNOSIS — D509 Iron deficiency anemia, unspecified: Secondary | ICD-10-CM | POA: Diagnosis present

## 2016-02-04 DIAGNOSIS — K922 Gastrointestinal hemorrhage, unspecified: Secondary | ICD-10-CM

## 2016-02-04 DIAGNOSIS — M5431 Sciatica, right side: Secondary | ICD-10-CM | POA: Diagnosis not present

## 2016-02-04 DIAGNOSIS — M25559 Pain in unspecified hip: Secondary | ICD-10-CM

## 2016-02-04 DIAGNOSIS — I482 Chronic atrial fibrillation: Secondary | ICD-10-CM

## 2016-02-04 DIAGNOSIS — Z86718 Personal history of other venous thrombosis and embolism: Secondary | ICD-10-CM | POA: Diagnosis not present

## 2016-02-04 DIAGNOSIS — Z7952 Long term (current) use of systemic steroids: Secondary | ICD-10-CM | POA: Diagnosis not present

## 2016-02-04 DIAGNOSIS — M8580 Other specified disorders of bone density and structure, unspecified site: Secondary | ICD-10-CM | POA: Diagnosis not present

## 2016-02-04 DIAGNOSIS — M6281 Muscle weakness (generalized): Secondary | ICD-10-CM

## 2016-02-04 DIAGNOSIS — M25551 Pain in right hip: Secondary | ICD-10-CM | POA: Diagnosis not present

## 2016-02-04 DIAGNOSIS — Z7982 Long term (current) use of aspirin: Secondary | ICD-10-CM | POA: Diagnosis not present

## 2016-02-04 HISTORY — DX: Gastrointestinal hemorrhage, unspecified: K92.2

## 2016-02-04 LAB — BASIC METABOLIC PANEL
Anion gap: 7 (ref 5–15)
BUN: 34 mg/dL — ABNORMAL HIGH (ref 6–20)
CHLORIDE: 103 mmol/L (ref 101–111)
CO2: 31 mmol/L (ref 22–32)
Calcium: 11.5 mg/dL — ABNORMAL HIGH (ref 8.9–10.3)
Creatinine, Ser: 1.95 mg/dL — ABNORMAL HIGH (ref 0.44–1.00)
GFR calc non Af Amer: 22 mL/min — ABNORMAL LOW (ref >60)
GFR, EST AFRICAN AMERICAN: 26 mL/min — AB (ref >60)
Glucose, Bld: 139 mg/dL — ABNORMAL HIGH (ref 65–99)
POTASSIUM: 4.7 mmol/L (ref 3.5–5.1)
SODIUM: 141 mmol/L (ref 135–145)

## 2016-02-04 LAB — I-STAT TROPONIN, ED: Troponin i, poc: 0.03 ng/mL (ref 0.00–0.08)

## 2016-02-04 LAB — CBC
HEMATOCRIT: 24.2 % — AB (ref 36.0–46.0)
HEMOGLOBIN: 7.6 g/dL — AB (ref 12.0–15.0)
MCH: 29 pg (ref 26.0–34.0)
MCHC: 31.4 g/dL (ref 30.0–36.0)
MCV: 92.4 fL (ref 78.0–100.0)
Platelets: 371 10*3/uL (ref 150–400)
RBC: 2.62 MIL/uL — AB (ref 3.87–5.11)
RDW: 19 % — ABNORMAL HIGH (ref 11.5–15.5)
WBC: 10.6 10*3/uL — AB (ref 4.0–10.5)

## 2016-02-04 LAB — FERRITIN: FERRITIN: 21 ng/mL (ref 11–307)

## 2016-02-04 LAB — PREPARE RBC (CROSSMATCH)

## 2016-02-04 LAB — CK: Total CK: 30 U/L — ABNORMAL LOW (ref 38–234)

## 2016-02-04 LAB — VITAMIN B12: Vitamin B-12: 1468 pg/mL — ABNORMAL HIGH (ref 180–914)

## 2016-02-04 LAB — RETICULOCYTES
RBC.: 2.51 MIL/uL — AB (ref 3.87–5.11)
RETIC COUNT ABSOLUTE: 153.1 10*3/uL (ref 19.0–186.0)
Retic Ct Pct: 6.1 % — ABNORMAL HIGH (ref 0.4–3.1)

## 2016-02-04 LAB — IRON AND TIBC
IRON: 20 ug/dL — AB (ref 28–170)
Saturation Ratios: 5 % — ABNORMAL LOW (ref 10.4–31.8)
TIBC: 395 ug/dL (ref 250–450)
UIBC: 375 ug/dL

## 2016-02-04 LAB — POC OCCULT BLOOD, ED: FECAL OCCULT BLD: NEGATIVE

## 2016-02-04 MED ORDER — GABAPENTIN 100 MG PO CAPS
200.0000 mg | ORAL_CAPSULE | Freq: Three times a day (TID) | ORAL | Status: DC
  Administered 2016-02-04 – 2016-02-07 (×9): 200 mg via ORAL
  Filled 2016-02-04 (×9): qty 2

## 2016-02-04 MED ORDER — OXYBUTYNIN CHLORIDE ER 5 MG PO TB24
5.0000 mg | ORAL_TABLET | Freq: Every day | ORAL | Status: DC
  Administered 2016-02-05 – 2016-02-07 (×3): 5 mg via ORAL
  Filled 2016-02-04 (×3): qty 1

## 2016-02-04 MED ORDER — SODIUM CHLORIDE 0.9 % IV BOLUS (SEPSIS)
1000.0000 mL | Freq: Once | INTRAVENOUS | Status: AC
  Administered 2016-02-04: 1000 mL via INTRAVENOUS

## 2016-02-04 MED ORDER — AMLODIPINE BESYLATE 5 MG PO TABS: 5.0000 mg | ORAL_TABLET | Freq: Every day | ORAL | Status: DC

## 2016-02-04 MED ORDER — CALCIUM CARBONATE 1500 (600 CA) MG PO TABS: 1.0000 | ORAL_TABLET | Freq: Two times a day (BID) | ORAL | Status: DC

## 2016-02-04 MED ORDER — ADULT MULTIVITAMIN W/MINERALS CH
1.0000 | ORAL_TABLET | Freq: Every day | ORAL | Status: DC
  Administered 2016-02-05 – 2016-02-07 (×3): 1 via ORAL
  Filled 2016-02-04 (×3): qty 1

## 2016-02-04 MED ORDER — GABAPENTIN 100 MG PO CAPS: 100.0000 mg | ORAL_CAPSULE | Freq: Every day | ORAL | Status: DC

## 2016-02-04 MED ORDER — HYDROCODONE-ACETAMINOPHEN 5-325 MG PO TABS
1.0000 | ORAL_TABLET | Freq: Four times a day (QID) | ORAL | Status: DC | PRN
  Administered 2016-02-04 – 2016-02-06 (×5): 1 via ORAL
  Filled 2016-02-04 (×5): qty 1

## 2016-02-04 MED ORDER — SODIUM CHLORIDE 0.9 % IV SOLN
10.0000 mL/h | Freq: Once | INTRAVENOUS | Status: AC
  Administered 2016-02-04: 10 mL/h via INTRAVENOUS

## 2016-02-04 MED ORDER — ASPIRIN 81 MG PO CHEW
81.0000 mg | CHEWABLE_TABLET | Freq: Every day | ORAL | Status: DC
Start: 2016-02-05 — End: 2016-02-07
  Administered 2016-02-05 – 2016-02-07 (×3): 81 mg via ORAL
  Filled 2016-02-04 (×3): qty 1

## 2016-02-04 MED ORDER — ENOXAPARIN SODIUM 40 MG/0.4ML ~~LOC~~ SOLN
40.0000 mg | Freq: Every day | SUBCUTANEOUS | Status: DC
  Administered 2016-02-04: 40 mg via SUBCUTANEOUS
  Filled 2016-02-04: qty 0.4

## 2016-02-04 MED ORDER — GABAPENTIN 100 MG PO CAPS: 200.0000 mg | ORAL_CAPSULE | Freq: Two times a day (BID) | ORAL | Status: DC

## 2016-02-04 MED ORDER — FAMOTIDINE IN NACL 20-0.9 MG/50ML-% IV SOLN
20.0000 mg | Freq: Once | INTRAVENOUS | Status: AC
  Administered 2016-02-04: 20 mg via INTRAVENOUS
  Filled 2016-02-04: qty 50

## 2016-02-04 MED ORDER — BENAZEPRIL HCL 40 MG PO TABS: 20.0000 mg | ORAL_TABLET | Freq: Every day | ORAL | Status: DC

## 2016-02-04 MED ORDER — ATORVASTATIN CALCIUM 10 MG PO TABS
5.0000 mg | ORAL_TABLET | Freq: Every day | ORAL | Status: DC
  Administered 2016-02-05 – 2016-02-06 (×2): 5 mg via ORAL
  Filled 2016-02-04 (×2): qty 1

## 2016-02-04 MED ORDER — ALUM & MAG HYDROXIDE-SIMETH 200-200-20 MG/5ML PO SUSP
30.0000 mL | Freq: Four times a day (QID) | ORAL | Status: DC | PRN
  Administered 2016-02-04: 30 mL via ORAL
  Filled 2016-02-04: qty 30

## 2016-02-04 MED ORDER — PENTOXIFYLLINE ER 400 MG PO TBCR
400.0000 mg | EXTENDED_RELEASE_TABLET | Freq: Every day | ORAL | Status: DC
  Administered 2016-02-05 – 2016-02-07 (×3): 400 mg via ORAL
  Filled 2016-02-04 (×3): qty 1

## 2016-02-04 MED ORDER — MULTIVITAMINS PO CAPS: 1.0000 | ORAL_CAPSULE | Freq: Every day | ORAL | Status: DC

## 2016-02-04 MED ORDER — FENOFIBRATE 160 MG PO TABS
160.0000 mg | ORAL_TABLET | Freq: Every day | ORAL | Status: DC
  Administered 2016-02-05 – 2016-02-07 (×3): 160 mg via ORAL
  Filled 2016-02-04 (×3): qty 1

## 2016-02-04 NOTE — Assessment & Plan Note (Addendum)
New onset (no piror hx) with severe generalized weakness, volume seems ok and rate only mild elevated, but debilitated and very high fall risk, I recommend pt go to ER for further evaluation, possible admit and anticoagulation consideration  Note:  Total time for pt hx, exam, review of record with pt in the room, determination of diagnoses and plan for further eval and tx is > 40 min, with over 50% spent in coordination and counseling of patient

## 2016-02-04 NOTE — Progress Notes (Signed)
Subjective:    Patient ID: Tracy [name], female    DOB: 23-Apr-1926, 82 y.o.   MRN: [id number]  HPI  Here with fatigue, weakness.  C/o right hip pain with some pain radiating to the distal RLE, has seen dr rogers/ortho, tx with low dose prednisone low dose for short course, helped some, came back in last few days. Does have f/u planned next wk.  Gabapentin 300 caused sedation, but 100 mg did not work.  Willing to try 200 mg tid.  Overall too fatigued and pain too much walk even much room to room.  Has PT orderd but unable to do this.  Slept all day and all night a few days ago.  BP has been low at home at times even 88 sbp.  No hx of afib or anticoagulation in the past Past Medical History:  Diagnosis Date  . Atrial fibrillation (HCC)   . CAROTID STENOSIS   . CVA (cerebrovascular accident) (HCC) 2002  . DIABETES MELLITUS, TYPE II   . Diverticulosis of colon   . DIVERTICULOSIS, COLON   . DM (diabetes mellitus), type 2 (HCC)   . DVT (deep venous thrombosis) (HCC)   . GERD   . GERD (gastroesophageal reflux disease)   . HTN (hypertension)   . Hx of adenomatous colonic polyps 2007  . Hyperlipidemia   . HYPERLIPIDEMIA   . Hypertension   . Osteopenia   . OSTEOPENIA   . PVD (peripheral vascular disease) (HCC)    recently dx w/ carotid -left  int carotid >90% 50-60% rt int carotid 02/2010  . PVD (peripheral vascular disease) (HCC)    lf  ABI 0.11 and rt 0.43  . Sinus node dysfunction (HCC) 2011  . Stroke [facility](HCC) July 01, 2014   Affected Left side   Past Surgical History:  Procedure Laterality Date  . CAROTID ENDARTERECTOMY    . cataract eye surgery    . CNS vascular stent per Neurosurgeon    . EYE SURGERY    . ILIAC ARTERY STENT  2011   Bilateral CIA stent  . lazer eye surgery    . Right Shoulder Surgery  2010  . TUBAL LIGATION      reports that she has never smoked. She has never used smokeless tobacco. She reports that she does not drink alcohol or use drugs. family  history includes Cancer in her mother; Diabetes in her sister. No Known Allergies Current Outpatient Prescriptions on File Prior to Visit  Medication Sig Dispense Refill  . aspirin 81 MG tablet Take 81 mg by mouth daily.      Marland Kitchen. atorvastatin (LIPITOR) 10 MG tablet 1/2 tab by mouth daily 45 tablet 3  . benazepril (LOTENSIN) 40 MG tablet TAKE 0.5 TABLETS (20 MG TOTAL) BY MOUTH DAILY. 45 tablet 3  . Calcium Carbonate (CALTRATE 600) 1500 MG TABS Take 1 tablet by mouth 2 (two) times daily.     . fenofibrate micronized (LOFIBRA) 134 MG capsule TAKE 1 CAPSULE (134 MG TOTAL) BY MOUTH DAILY. 90 capsule 3  . gabapentin (NEURONTIN) 100 MG capsule Take 1 capsule (100 mg total) by mouth at bedtime. 90 capsule 3  . Multiple Vitamin (MULTIVITAMIN) capsule Take 1 capsule by mouth daily. Centrum Silver    . oxybutynin (DITROPAN-XL) 5 MG 24 hr tablet TAKE 1 TABLET BY MOUTH EVERY DAY 90 tablet 3  . pentoxifylline (TRENTAL) 400 MG CR tablet TAKE 1 TABLET (400 MG TOTAL) BY MOUTH DAILY. 90 tablet 3   No current  facility-administered medications on file prior to visit.     Review of Systems  Constitutional: Negative for unusual diaphoresis or night sweats HENT: Negative for ear swelling or discharge Eyes: Negative for worsening visual haziness  Respiratory: Negative for choking and stridor.   Gastrointestinal: Negative for distension or worsening eructation Genitourinary: Negative for retention or change in urine volume.  Musculoskeletal: Negative for other MSK pain or swelling Skin: Negative for color change and worsening wound Neurological: Negative for tremors and numbness other than noted  Psychiatric/Behavioral: Negative for decreased concentration or agitation other than above   All other neg per pt     Objective:   Physical Exam BP 118/70   Pulse 96   Temp 98.8 F (37.1 C) (Oral)   Resp 20   SpO2 96%  VS noted, including neg orthostatics Lying 122/78 Standing 122/70 Constitutional: Pt  appears in no apparent distress HENT: Head: NCAT.  Right Ear: External ear normal.  Left Ear: External ear normal.  Eyes: . Pupils are equal, round, and reactive to light. Conjunctivae and EOM are normal Neck: Normal range of motion. Neck supple.  Cardiovascular: tachy and IRIR Pulmonary/Chest: Effort normal and breath sounds without rales or wheezing.  Neurological: Pt is alert. Not confused , motor grossly intact Skin: Skin is warm. No rash, no LE edema Psychiatric: Pt behavior is normal. No agitation.   ERCG I personally reviewed Atrial fibrillation  Voltage criteria for LVH  (R(I)+S(III) exceeds 2.00 mV).   -Old anterior infarct.   -  Nonspecific T-abnormality.       Assessment & Plan:

## 2016-02-04 NOTE — Assessment & Plan Note (Signed)
See ecg

## 2016-02-04 NOTE — Assessment & Plan Note (Signed)
Likely related to arrythmia, treatment as above

## 2016-02-04 NOTE — Progress Notes (Signed)
Pre visit review using our clinic review tool, if applicable. No additional management support is needed unless otherwise documented below in the visit note. 

## 2016-02-04 NOTE — ED Provider Notes (Signed)
[facility]-EMERGENCY DEPT Provider Note   CSN: [id number] Arrival date & time: 02/04/16  1702     History   Chief Complaint Chief Complaint  Patient presents with  . Leg Pain  . Irregular Heart Beat    HPI Tracy [name] is a 82 y.o. female.  Patient complains of general weakness for 10 days. She was seen over at her physician's office and then sent to the emergency department today    Weakness  Primary symptoms include no focal weakness. This is a new problem. The current episode started more than 2 days ago. The problem has not changed since onset.There was no focality noted. There has been no fever. Pertinent negatives include no chest pain and no headaches. There were no medications administered prior to arrival. Associated medical issues do not include trauma.    Past Medical History:  Diagnosis Date  . Atrial fibrillation (HCC)   . CAROTID STENOSIS   . CVA (cerebrovascular accident) (HCC) 2002  . DIABETES MELLITUS, TYPE II   . Diverticulosis of colon   . DIVERTICULOSIS, COLON   . DM (diabetes mellitus), type 2 (HCC)   . DVT (deep venous thrombosis) (HCC)   . GERD   . GERD (gastroesophageal reflux disease)   . HTN (hypertension)   . Hx of adenomatous colonic polyps 2007  . Hyperlipidemia   . HYPERLIPIDEMIA   . Hypertension   . Osteopenia   . OSTEOPENIA   . PVD (peripheral vascular disease) (HCC)    recently dx w/ carotid -left  int carotid >90% 50-60% rt int carotid 02/2010  . PVD (peripheral vascular disease) (HCC)    lf  ABI 0.11 and rt 0.43  . Sinus node dysfunction (HCC) 2011  . Stroke [facility]) July 01, 2014   Affected Left side    Patient Active Problem List   Diagnosis Date Noted  . Irregular heart beats 02/04/2016  . General weakness 02/04/2016  . A-fib (HCC) 02/04/2016  . Anemia 02/04/2016  . Hypercalcemia 02/04/2016  . AKI (acute kidney injury) (HCC) 02/04/2016  . Right sided sciatica 11/18/2015  . Trigger point of right shoulder region  05/13/2015  . Cerebrovascular accident (CVA) due to thrombosis of right posterior cerebral artery (HCC) 04/17/2015  . PVD (peripheral vascular disease) (HCC) 04/17/2015  . Overactive bladder 11/15/2014  . Weakness 10/24/2014  . Cerebral infarction due to thrombosis of right middle cerebral artery (HCC) 10/17/2014  . LGI bleed 08/22/2014  . Monoplegia of left lower extremity (HCC) 08/16/2014  . Gait disturbance, post-stroke 08/16/2014  . Hematochezia 08/13/2014  . Altered sensation due to stroke (HCC) 07/15/2014  . Hip pain, acute   . Bradycardia 07/01/2014  . Elevated troponin 07/01/2014  . Dysuria 07/01/2014  . Constipation 07/01/2014  . Right ankle pain 01/29/2014  . Rotator cuff tear arthropathy of right shoulder 06/19/2013  . Left rotator cuff tear arthropathy 05/17/2013  . Fatigue 02/15/2012  . Aneurysm of iliac artery (HCC) 01/26/2012  . Peripheral vascular disease, unspecified 01/26/2012  . Left shoulder pain 02/09/2011  . Preventative health care 08/02/2010  . Sinus bradycardia 05/29/2010  . SICK SINUS SYNDROME 03/10/2010  . CAROTID STENOSIS 03/10/2010  . PVD WITH CLAUDICATION 02/23/2010  . CONSTIPATION 10/01/2008  . NECK MASS 10/01/2008  . Diabetes (HCC) 08/01/2007  . DIVERTICULOSIS, COLON 08/01/2007  . SHOULDER PAIN, RIGHT 08/01/2007  . COLONIC POLYPS, HX OF 08/01/2007  . GERD 12/25/2006  . OSTEOPENIA 12/25/2006  . Hyperlipidemia 11/29/2006  . Essential hypertension 11/29/2006    Past Surgical  History:  Procedure Laterality Date  . CAROTID ENDARTERECTOMY    . cataract eye surgery    . CNS vascular stent per Neurosurgeon    . EYE SURGERY    . ILIAC ARTERY STENT  2011   Bilateral CIA stent  . lazer eye surgery    . Right Shoulder Surgery  2010  . TUBAL LIGATION      OB History    Gravida Para Term Preterm AB Living   0 0 0 0 0     SAB TAB Ectopic Multiple Live Births   0 0 0           Home Medications    Prior to Admission medications     Medication Sig Start Date End Date Taking? Authorizing Provider  amLODipine (NORVASC) 5 MG tablet Take 5 mg by mouth daily. 01/21/16  Yes Historical Provider, MD  aspirin 81 MG tablet Take 81 mg by mouth daily.     Yes Historical Provider, MD  atorvastatin (LIPITOR) 10 MG tablet 1/2 tab by mouth daily Patient taking differently: Take 5 mg by mouth daily at 6 PM. 1/2 tab by mouth daily 11/18/15  Yes Corwin Levins, MD  benazepril (LOTENSIN) 40 MG tablet TAKE 0.5 TABLETS (20 MG TOTAL) BY MOUTH DAILY. 12/09/15  Yes Corwin Levins, MD  Calcium Carbonate (CALTRATE 600) 1500 MG TABS Take 1 tablet by mouth 2 (two) times daily.    Yes Historical Provider, MD  fenofibrate micronized (LOFIBRA) 134 MG capsule TAKE 1 CAPSULE (134 MG TOTAL) BY MOUTH DAILY. 04/17/15  Yes Corwin Levins, MD  FLUZONE HIGH-DOSE 0.5 ML SUSY Inject 1 application into the muscle once. 01/19/16  Yes Historical Provider, MD  gabapentin (NEURONTIN) 100 MG capsule Take 1 capsule (100 mg total) by mouth at bedtime. 08/01/15  Yes Judi Saa, DO  HYDROcodone-acetaminophen (NORCO/VICODIN) 5-325 MG tablet Take 1 tablet by mouth every 6 (six) hours as needed for moderate pain.   Yes Historical Provider, MD  Multiple Vitamin (MULTIVITAMIN) capsule Take 1 capsule by mouth daily. Centrum Silver   Yes Historical Provider, MD  Multiple Vitamins-Minerals (PRESERVISION AREDS 2) CAPS Take 1 capsule by mouth 2 (two) times daily.   Yes Historical Provider, MD  naproxen sodium (ANAPROX) 220 MG tablet Take 220 mg by mouth daily.   Yes Historical Provider, MD  oxybutynin (DITROPAN-XL) 5 MG 24 hr tablet TAKE 1 TABLET BY MOUTH EVERY DAY 10/27/15  Yes Corwin Levins, MD  pentoxifylline (TRENTAL) 400 MG CR tablet TAKE 1 TABLET (400 MG TOTAL) BY MOUTH DAILY. 04/17/15  Yes Corwin Levins, MD  predniSONE (DELTASONE) 5 MG tablet Take 5-30 mg by mouth daily. 01/23/16   Historical Provider, MD    Family History Family History  Problem Relation Age of Onset  . Cancer Mother      colon  . Diabetes Sister     Amputation    Social History Social History  Substance Use Topics  . Smoking status: Never Smoker  . Smokeless tobacco: Never Used  . Alcohol use No     Allergies   Review of patient's allergies indicates no known allergies.   Review of Systems Review of Systems  Constitutional: Negative for appetite change and fatigue.  HENT: Negative for congestion, ear discharge and sinus pressure.   Eyes: Negative for discharge.  Respiratory: Negative for cough.   Cardiovascular: Negative for chest pain.  Gastrointestinal: Negative for abdominal pain and diarrhea.  Genitourinary: Negative for frequency and hematuria.  Musculoskeletal: Negative for back pain.  Skin: Negative for rash.  Neurological: Positive for weakness. Negative for focal weakness, seizures and headaches.  Psychiatric/Behavioral: Negative for hallucinations.     Physical Exam Updated Vital Signs BP 132/69   Pulse 74   Temp 98 F (36.7 C) (Oral)   Resp 21   SpO2 97%   Physical Exam  Constitutional: She is oriented to person, place, and time. She appears well-developed.  HENT:  Head: Normocephalic.  Eyes: Conjunctivae and EOM are normal. No scleral icterus.  Neck: Neck supple. No thyromegaly present.  Cardiovascular: Normal rate and regular rhythm.  Exam reveals no gallop and no friction rub.   No murmur heard. Pulmonary/Chest: No stridor. She has no wheezes. She has no rales. She exhibits no tenderness.  Abdominal: She exhibits no distension. There is no tenderness. There is no rebound.  Genitourinary:  Genitourinary Comments: Rectal exam heme-negative  Musculoskeletal: Normal range of motion. She exhibits no edema.  Lymphadenopathy:    She has no cervical adenopathy.  Neurological: She is oriented to person, place, and time. She exhibits normal muscle tone. Coordination normal.  Skin: No rash noted. No erythema.  Psychiatric: She has a normal mood and affect. Her  behavior is normal.     ED Treatments / Results  Labs (all labs ordered are listed, but only abnormal results are displayed) Labs Reviewed  BASIC METABOLIC PANEL - Abnormal; Notable for the following:       Result Value   Glucose, Bld 139 (*)    BUN 34 (*)    Creatinine, Ser 1.95 (*)    Calcium 11.5 (*)    GFR calc non Af Amer 22 (*)    GFR calc Af Amer 26 (*)    All other components within normal limits  CBC - Abnormal; Notable for the following:    WBC 10.6 (*)    RBC 2.62 (*)    Hemoglobin 7.6 (*)    HCT 24.2 (*)    RDW 19.0 (*)    All other components within normal limits  VITAMIN B12  FOLATE  IRON AND TIBC  FERRITIN  RETICULOCYTES  PTH, INTACT AND CALCIUM  I-STAT TROPOININ, ED  POC OCCULT BLOOD, ED  PREPARE RBC (CROSSMATCH)  TYPE AND SCREEN    EKG  EKG Interpretation  Date/Time:  Wednesday February 04 2016 17:18:14 EDT Ventricular Rate:  92 PR Interval:    QRS Duration: 106 QT Interval:  332 QTC Calculation: 410 R Axis:   -18 Text Interpretation:  Atrial fibrillation Anteroseptal infarct , age undetermined Abnormal ECG Confirmed by Alazae Crymes  MD, Tevyn Codd [phone]([id number]) on 02/04/2016 6:28:07 PM       Radiology Dg Chest 2 View  Result Date: 02/04/2016 CLINICAL DATA:  Right leg pain with left foot swelling. Irregular heart rate EXAM: CHEST  2 VIEW COMPARISON:  04/28/2010 FINDINGS: The lungs are hyperinflated. Tiny bilateral pleural effusions or thickening. Mild cardiomegaly without overt failure. Atherosclerosis of the aorta. No pneumothorax. Multiple calcific opacities bilaterally, grossly unchanged. There is a small hiatal hernia. A sclerotic lesion the proximal right humerus is also unchanged. IMPRESSION: 1. Hyperinflation with tiny bilateral CP angle effusion or thickening 2. Stable mild cardiomegaly without overt failure. Atherosclerosis of the aorta. 3. Stable calcified pleural plaques. 4. Stable sclerotic lesion in the proximal right humerus. 5. Small hiatal hernia  Electronically Signed   By: Jasmine PangKim  Fujinaga M.D.   On: 02/04/2016 18:14    Procedures Procedures (including critical care time)  Medications Ordered in  ED Medications  0.9 %  sodium chloride infusion (not administered)  aspirin tablet 81 mg (not administered)  atorvastatin (LIPITOR) tablet 5 mg (not administered)  fenofibrate tablet 160 mg (not administered)  gabapentin (NEURONTIN) capsule 100 mg (not administered)  multivitamin capsule 1 capsule (not administered)  oxybutynin (DITROPAN-XL) 24 hr tablet 5 mg (not administered)  pentoxifylline (TRENTAL) CR tablet 400 mg (not administered)  famotidine (PEPCID) IVPB 20 mg premix (20 mg Intravenous New Bag/Given 02/04/16 1931)  sodium chloride 0.9 % bolus 1,000 mL (1,000 mLs Intravenous New Bag/Given 02/04/16 1931)     Initial Impression / Assessment and Plan / ED Course  I have reviewed the triage vital signs and the nursing notes.  Pertinent labs & imaging results that were available during my care of the patient were reviewed by me and considered in my medical decision making (see chart for details).  Clinical Course    Patient with anemia and weakness. She'll be admitted for observation and possibly transfused  Final Clinical Impressions(s) / ED Diagnoses   Final diagnoses:  Other iron deficiency anemia    New Prescriptions New Prescriptions   No medications on file     Bethann BerkshireJoseph Khadijatou Borak, MD 02/04/16 2016

## 2016-02-04 NOTE — Assessment & Plan Note (Signed)
Pt first complaint today - consider change to 200 tid, but consider holding until d/c as would not want any sedation episode to interfere with inpatient eval and tx

## 2016-02-04 NOTE — Patient Instructions (Addendum)
OK to decrease the benazepril to 20 mg per day  OK to stop the amlodipine  But you have Atrial Fibrillation with fast heart rate;  You should go directly to ER for treatment   Please continue all other medications as before, and refills have been done if requested.  Please keep your appointments with your specialists as you may have planned

## 2016-02-04 NOTE — H&P (Signed)
History and Physical    Tracy [name] KGM:[id number] DOB: 23-Sep-1926 DOA: 02/04/2016   PCP: Oliver Barre, MD Chief Complaint:  Chief Complaint  Patient presents with  . Leg Pain  . Irregular Heart Beat    HPI: Tracy [name] is a 82 y.o. female with medical history significant of HTN, a.fib.  Patient presents to the ED with c/o 10 day history of generalized weakness.  Couple of days ago patient was sleeping all day and night.  No specific CP or SOB, just has no energy in general.  Nothing makes better or worse, no specific reports of melena or blood in stool.  Took BP at home a couple of days ago and was running 88 systolic.  Recently had fall and resulting RLE pain and hip pain.  X ray negative with ortho, had course of prednisone and started on gabapentin which helped, 100mg  QHs didn't help, 300mg  QHs caused sedation so they backed it down to the 100.  Her CC of generalized weakness and fatigue has been going on even before she was put on gabapentin however.  ED Course: Has anemia, has AKI, has   Review of Systems: As per HPI otherwise 10 point review of systems negative.    Past Medical History:  Diagnosis Date  . Atrial fibrillation (HCC)   . CAROTID STENOSIS   . CVA (cerebrovascular accident) (HCC) 2002  . DIABETES MELLITUS, TYPE II   . Diverticulosis of colon   . DIVERTICULOSIS, COLON   . DM (diabetes mellitus), type 2 (HCC)   . DVT (deep venous thrombosis) (HCC)   . GERD   . GERD (gastroesophageal reflux disease)   . HTN (hypertension)   . Hx of adenomatous colonic polyps 2007  . Hyperlipidemia   . HYPERLIPIDEMIA   . Hypertension   . Osteopenia   . OSTEOPENIA   . PVD (peripheral vascular disease) (HCC)    recently dx w/ carotid -left  int carotid >90% 50-60% rt int carotid 02/2010  . PVD (peripheral vascular disease) (HCC)    lf  ABI 0.11 and rt 0.43  . Sinus node dysfunction (HCC) 2011  . Stroke [facility]) July 01, 2014   Affected Left side    Past Surgical  History:  Procedure Laterality Date  . CAROTID ENDARTERECTOMY    . cataract eye surgery    . CNS vascular stent per Neurosurgeon    . EYE SURGERY    . ILIAC ARTERY STENT  2011   Bilateral CIA stent  . lazer eye surgery    . Right Shoulder Surgery  2010  . TUBAL LIGATION       reports that she has never smoked. She has never used smokeless tobacco. She reports that she does not drink alcohol or use drugs.  No Known Allergies  Family History  Problem Relation Age of Onset  . Cancer Mother     colon  . Diabetes Sister     Amputation      Prior to Admission medications   Medication Sig Start Date End Date Taking? Authorizing Provider  amLODipine (NORVASC) 5 MG tablet Take 5 mg by mouth daily. 01/21/16  Yes Historical Provider, MD  aspirin 81 MG tablet Take 81 mg by mouth daily.     Yes Historical Provider, MD  atorvastatin (LIPITOR) 10 MG tablet 1/2 tab by mouth daily Patient taking differently: Take 5 mg by mouth daily at 6 PM. 1/2 tab by mouth daily 11/18/15  Yes Corwin Levins, MD  benazepril (  LOTENSIN) 40 MG tablet TAKE 0.5 TABLETS (20 MG TOTAL) BY MOUTH DAILY. 12/09/15  Yes Corwin LevinsJames W John, MD  Calcium Carbonate (CALTRATE 600) 1500 MG TABS Take 1 tablet by mouth 2 (two) times daily.    Yes Historical Provider, MD  fenofibrate micronized (LOFIBRA) 134 MG capsule TAKE 1 CAPSULE (134 MG TOTAL) BY MOUTH DAILY. 04/17/15  Yes Corwin LevinsJames W John, MD  FLUZONE HIGH-DOSE 0.5 ML SUSY Inject 1 application into the muscle once. 01/19/16  Yes Historical Provider, MD  gabapentin (NEURONTIN) 100 MG capsule Take 1 capsule (100 mg total) by mouth at bedtime. 08/01/15  Yes Judi SaaZachary M Smith, DO  HYDROcodone-acetaminophen (NORCO/VICODIN) 5-325 MG tablet Take 1 tablet by mouth every 6 (six) hours as needed for moderate pain.   Yes Historical Provider, MD  Multiple Vitamin (MULTIVITAMIN) capsule Take 1 capsule by mouth daily. Centrum Silver   Yes Historical Provider, MD  Multiple Vitamins-Minerals (PRESERVISION  AREDS 2) CAPS Take 1 capsule by mouth 2 (two) times daily.   Yes Historical Provider, MD  naproxen sodium (ANAPROX) 220 MG tablet Take 220 mg by mouth daily.   Yes Historical Provider, MD  oxybutynin (DITROPAN-XL) 5 MG 24 hr tablet TAKE 1 TABLET BY MOUTH EVERY DAY 10/27/15  Yes Corwin LevinsJames W John, MD  pentoxifylline (TRENTAL) 400 MG CR tablet TAKE 1 TABLET (400 MG TOTAL) BY MOUTH DAILY. 04/17/15  Yes Corwin LevinsJames W John, MD    Physical Exam: Vitals:   02/04/16 1719 02/04/16 1826 02/04/16 1845  BP: 126/64 141/81 132/69  Pulse: 73 82 74  Resp: 18 15 21   Temp: 98.1 F (36.7 C) 98 F (36.7 C)   TempSrc: Oral Oral   SpO2: 97% 98% 97%      Constitutional: NAD, calm, comfortable Eyes: PERRL, lids and conjunctivae normal ENMT: Mucous membranes are moist. Posterior pharynx clear of any exudate or lesions.Normal dentition.  Neck: normal, supple, no masses, no thyromegaly Respiratory: clear to auscultation bilaterally, no wheezing, no crackles. Normal respiratory effort. No accessory muscle use.  Cardiovascular: Regular rate and rhythm, no murmurs / rubs / gallops. No extremity edema. 2+ pedal pulses. No carotid bruits.  Abdomen: no tenderness, no masses palpated. No hepatosplenomegaly. Bowel sounds positive.  Musculoskeletal: no clubbing / cyanosis. No joint deformity upper and lower extremities. Good ROM, no contractures. Normal muscle tone.  Skin: no rashes, lesions, ulcers. No induration Neurologic: CN 2-12 grossly intact. Sensation intact, DTR normal. Strength 5/5 in all 4.  Psychiatric: Normal judgment and insight. Alert and oriented x 3. Normal mood.    Labs on Admission: I have personally reviewed following labs and imaging studies  CBC:  Recent Labs Lab 02/04/16 1718  WBC 10.6*  HGB 7.6*  HCT 24.2*  MCV 92.4  PLT 371   Basic Metabolic Panel:  Recent Labs Lab 02/04/16 1718  NA 141  K 4.7  CL 103  CO2 31  GLUCOSE 139*  BUN 34*  CREATININE 1.95*  CALCIUM 11.5*   GFR: CrCl  cannot be calculated (Unknown ideal weight.). Liver Function Tests: No results for input(s): AST, ALT, ALKPHOS, BILITOT, PROT, ALBUMIN in the last 168 hours. No results for input(s): LIPASE, AMYLASE in the last 168 hours. No results for input(s): AMMONIA in the last 168 hours. Coagulation Profile: No results for input(s): INR, PROTIME in the last 168 hours. Cardiac Enzymes: No results for input(s): CKTOTAL, CKMB, CKMBINDEX, TROPONINI in the last 168 hours. BNP (last 3 results) No results for input(s): PROBNP in the last 8760 hours. HbA1C: No results  for input(s): HGBA1C in the last 72 hours. CBG: No results for input(s): GLUCAP in the last 168 hours. Lipid Profile: No results for input(s): CHOL, HDL, LDLCALC, TRIG, CHOLHDL, LDLDIRECT in the last 72 hours. Thyroid Function Tests: No results for input(s): TSH, T4TOTAL, FREET4, T3FREE, THYROIDAB in the last 72 hours. Anemia Panel: No results for input(s): VITAMINB12, FOLATE, FERRITIN, TIBC, IRON, RETICCTPCT in the last 72 hours. Urine analysis:    Component Value Date/Time   COLORURINE YELLOW 05/27/2015 1351   APPEARANCEUR CLEAR 05/27/2015 1351   LABSPEC 1.020 05/27/2015 1351   PHURINE 6.5 05/27/2015 1351   GLUCOSEU NEGATIVE 05/27/2015 1351   HGBUR NEGATIVE 05/27/2015 1351   BILIRUBINUR NEGATIVE 05/27/2015 1351   KETONESUR NEGATIVE 05/27/2015 1351   PROTEINUR NEGATIVE 07/01/2014 1510   UROBILINOGEN 0.2 05/27/2015 1351   NITRITE NEGATIVE 05/27/2015 1351   LEUKOCYTESUR NEGATIVE 05/27/2015 1351   Sepsis Labs: @LABRCNTIP (procalcitonin:4,lacticidven:4) )No results found for this or any previous visit (from the past 240 hour(s)).   Radiological Exams on Admission: Dg Chest 2 View  Result Date: 02/04/2016 CLINICAL DATA:  Right leg pain with left foot swelling. Irregular heart rate EXAM: CHEST  2 VIEW COMPARISON:  04/28/2010 FINDINGS: The lungs are hyperinflated. Tiny bilateral pleural effusions or thickening. Mild cardiomegaly  without overt failure. Atherosclerosis of the aorta. No pneumothorax. Multiple calcific opacities bilaterally, grossly unchanged. There is a small hiatal hernia. A sclerotic lesion the proximal right humerus is also unchanged. IMPRESSION: 1. Hyperinflation with tiny bilateral CP angle effusion or thickening 2. Stable mild cardiomegaly without overt failure. Atherosclerosis of the aorta. 3. Stable calcified pleural plaques. 4. Stable sclerotic lesion in the proximal right humerus. 5. Small hiatal hernia Electronically Signed   By: Jasmine PangKim  Fujinaga M.D.   On: 02/04/2016 18:14    EKG: Independently reviewed.  Assessment/Plan Principal Problem:   Weakness Active Problems:   Essential hypertension   A-fib (HCC)   Anemia   Hypercalcemia   AKI (acute kidney injury) (HCC)    1. Weakness - DDx includes anemia, AKI, hypercalcemia 1. PT/OT 2. See specifics of other treatments below 2. Anemia - 1. Treat anemia as symptomatic, transfusing 2 units PRBC and re check CBC in AM 2. Anemia studies ordered 3. Repeat CBC in AM 3. AKI - 1. Holding lisinopril and norvasc especially with h/o hypotension recently at home 2. Repeat BMP in AM 3. Hydrate and transfuse 4. R hip pain -  1. Will try Neurontin 200mg  TID as Dr. Fayrene FearingJames wanted to put her on today in office. 2. CT of hip to make sure no occult fracture 3. If negative then next step is MRI L spine to look for radiculopathy 5. HTN - pulling off BP meds, see above 6. Hypercalcemia -  1. check PTH 2. Only mild at the moment, if persists then may want to get CT chest to look for tumor but patient asymptomatic from pulm standpoint right now, has pleural plaques on CXR but these stable since 2012.  No known h/o asbestos exposure.   DVT prophylaxis: Lovenox Code Status: Full Family Communication: Family at bedside Consults called: None Admission status: Admit to obs   GARDNER, Heywood IlesJARED M. DO [facility] Pager [phone] from 7PM-7AM  If 7AM-7PM,  please contact the day physician for the patient [email or web address] Password [email or web address]  02/04/2016, 8:25 PM

## 2016-02-04 NOTE — ED Triage Notes (Signed)
Pt reports going to pcp today due to right leg pain. Also had recent swelling to left foot. Was sent here today due to irregular HR. Denies CP or SOB.

## 2016-02-04 NOTE — ED Notes (Signed)
As per admit MD pt to wait on blood transfusion will re-evaluate blood bank notified

## 2016-02-04 NOTE — ED Notes (Signed)
Spoke with Dr. Julian ReilGardner they want to transfuse accepting RN made aware and blood bank made aware

## 2016-02-05 ENCOUNTER — Encounter (HOSPITAL_COMMUNITY): Payer: Self-pay | Admitting: Radiology

## 2016-02-05 ENCOUNTER — Observation Stay (HOSPITAL_COMMUNITY): Payer: Medicare Other

## 2016-02-05 DIAGNOSIS — R531 Weakness: Secondary | ICD-10-CM | POA: Diagnosis not present

## 2016-02-05 DIAGNOSIS — N179 Acute kidney failure, unspecified: Secondary | ICD-10-CM | POA: Diagnosis not present

## 2016-02-05 DIAGNOSIS — D649 Anemia, unspecified: Secondary | ICD-10-CM | POA: Diagnosis not present

## 2016-02-05 DIAGNOSIS — M5126 Other intervertebral disc displacement, lumbar region: Secondary | ICD-10-CM | POA: Diagnosis not present

## 2016-02-05 DIAGNOSIS — M48061 Spinal stenosis, lumbar region without neurogenic claudication: Secondary | ICD-10-CM | POA: Diagnosis not present

## 2016-02-05 DIAGNOSIS — I481 Persistent atrial fibrillation: Secondary | ICD-10-CM | POA: Diagnosis not present

## 2016-02-05 DIAGNOSIS — I482 Chronic atrial fibrillation: Secondary | ICD-10-CM | POA: Diagnosis not present

## 2016-02-05 LAB — BASIC METABOLIC PANEL
ANION GAP: 10 (ref 5–15)
BUN: 34 mg/dL — ABNORMAL HIGH (ref 6–20)
CHLORIDE: 109 mmol/L (ref 101–111)
CO2: 24 mmol/L (ref 22–32)
Calcium: 11.1 mg/dL — ABNORMAL HIGH (ref 8.9–10.3)
Creatinine, Ser: 1.9 mg/dL — ABNORMAL HIGH (ref 0.44–1.00)
GFR calc Af Amer: 26 mL/min — ABNORMAL LOW (ref >60)
GFR, EST NON AFRICAN AMERICAN: 23 mL/min — AB (ref >60)
Glucose, Bld: 137 mg/dL — ABNORMAL HIGH (ref 65–99)
POTASSIUM: 4.6 mmol/L (ref 3.5–5.1)
SODIUM: 143 mmol/L (ref 135–145)

## 2016-02-05 LAB — FOLATE: Folate: 54.3 ng/mL (ref >5.9)

## 2016-02-05 LAB — CBC
HCT: 34.9 % — ABNORMAL LOW (ref 36.0–46.0)
HEMOGLOBIN: 11.3 g/dL — AB (ref 12.0–15.0)
MCH: 28.9 pg (ref 26.0–34.0)
MCHC: 32.4 g/dL (ref 30.0–36.0)
MCV: 89.3 fL (ref 78.0–100.0)
PLATELETS: 397 10*3/uL (ref 150–400)
RBC: 3.91 MIL/uL (ref 3.87–5.11)
RDW: 18.7 % — ABNORMAL HIGH (ref 11.5–15.5)
WBC: 16.9 10*3/uL — AB (ref 4.0–10.5)

## 2016-02-05 MED ORDER — AMLODIPINE BESYLATE 5 MG PO TABS
5.0000 mg | ORAL_TABLET | Freq: Every day | ORAL | Status: DC
  Administered 2016-02-06 – 2016-02-07 (×2): 5 mg via ORAL
  Filled 2016-02-05 (×2): qty 1

## 2016-02-05 MED ORDER — ONDANSETRON HCL 4 MG/2ML IJ SOLN
4.0000 mg | Freq: Three times a day (TID) | INTRAMUSCULAR | Status: DC | PRN
  Filled 2016-02-05: qty 2

## 2016-02-05 MED ORDER — ENOXAPARIN SODIUM 30 MG/0.3ML ~~LOC~~ SOLN
30.0000 mg | Freq: Every day | SUBCUTANEOUS | Status: DC
  Administered 2016-02-05 – 2016-02-06 (×2): 30 mg via SUBCUTANEOUS
  Filled 2016-02-05 (×2): qty 0.3

## 2016-02-05 NOTE — Evaluation (Signed)
Physical Therapy Evaluation Patient Details Name: Tracy [name] MRN: [id number] DOB: 02-Apr-1927 Today's Date: 02/05/2016   History of Present Illness  Tracy [name] is a 82 y.o. female with medical history significant of HTN, a.fib.  PT with recent fall and c/o R hip pain which is (-) for fracture. Patient presents to the ED with c/o 10 day history of generalized weakness.  Couple of days ago patient was sleeping all day and night.  No specific CP or SOB, just has no energy in general.  Nothing makes better or worse, no specific reports of melena or blood in stool.  Took BP at home a couple of days ago and was running 88 systolic.  Clinical Impression  Pt admitted with above diagnosis. Pt currently with functional limitations due to the deficits listed below (see PT Problem List).  Pt will benefit from skilled PT to increase their independence and safety with mobility to allow discharge to the venue listed below.  Pt with decreased balance with very weak LE in standing causing pt to be very unsteady with standing.  On 3rd attempt, pt was able to do a SPT with RW and shuffled steps with MOD A.  Family states they would like her to return home and can provide 24 hour A.  Pt will need a w/c due to unsafe for ambulation at this time.  She will also need HHPT.     Follow Up Recommendations Home health PT;Supervision/Assistance - 24 hour;Supervision for mobility/OOB    Equipment Recommendations  Wheelchair (measurements PT);Wheelchair cushion (measurements PT)    Recommendations for Other Services       Precautions / Restrictions Precautions Precautions: Fall      Mobility  Bed Mobility Overal bed mobility: Needs Assistance Bed Mobility: Supine to Sit     Supine to sit: Min guard     General bed mobility comments: min/guard, but slowly and with difficulty  Transfers Overall transfer level: Needs assistance Equipment used: Rolling walker (2 wheeled) Transfers: Sit to/from  UG[employer] CorporationStand;Stand Pivot Transfers Sit to Stand: Mod assist;Max assist Stand pivot transfers: Mod assist       General transfer comment: Pt stood 2x with RW and demonstrated posterior lean and extremely weak legs, just kind of bouncing and had to sit down for safety.  On 3rd attempt, focused on just standing and pushing UE down thru RW, then performed SPT with shuffled steps to recliner.  Ambulation/Gait             General Gait Details: SPT only.  Not safe to attempt ambulation due to weakness and posterior balance  Stairs            Wheelchair Mobility    Modified Rankin (Stroke Patients Only)       Balance Overall balance assessment: History of Falls;Needs assistance   Sitting balance-Leahy Scale: Fair       Standing balance-Leahy Scale: Zero Standing balance comment: Extreme weakness noted in standing with posterior lean and then she would try and compensate and trunk was moving allover the place                             Pertinent Vitals/Pain Pain Assessment: Faces Faces Pain Scale: Hurts whole lot Pain Location: Pt states pain is severe in R hip/buttocks, extending down lateral thigh into knee  Pain Descriptors / Indicators: Grimacing;Guarding Pain Intervention(s): Limited activity within patient's tolerance;Monitored during session    Home Living Family/patient  expects to be discharged to:: Private residence Living Arrangements: Children Available Help at Discharge: Family;Available 24 hours/day Type of Home: House Home Access: Level entry     Home Layout: One level Home Equipment: Bedside commode;Walker - 4 wheels      Prior Function Level of Independence: Needs assistance   Gait / Transfers Assistance Needed: in last couple weeks has needed MOD A with gait due to R leg hurting, prior to that was MOD I with rollator           Hand Dominance        Extremity/Trunk Assessment   Upper Extremity Assessment: Defer to OT  evaluation           Lower Extremity Assessment: RLE deficits/detail;LLE deficits/detail RLE Deficits / Details: c/o severe pain in R LE. in supine, she had leg fully flexed and hip and knee.  MMT hip flex 3-/5, quads 4/5 LLE Deficits / Details: hip flex 3+/5, quads 4/5     Communication      Cognition Arousal/Alertness: Awake/alert Behavior During Therapy: WFL for tasks assessed/performed Overall Cognitive Status: Within Functional Limits for tasks assessed                 General Comments: Pt oriented, but easily distracted.  Messing with her o2 and heart monitor, but follows all instructions.    General Comments General comments (skin integrity, edema, etc.): Daughter present    Exercises     Assessment/Plan    PT Assessment Patient needs continued PT services  PT Problem List Decreased strength;Decreased activity tolerance;Decreased balance;Decreased mobility;Decreased knowledge of use of DME;Pain          PT Treatment Interventions DME instruction;Gait training;Functional mobility training;Therapeutic activities;Therapeutic exercise;Balance training;Patient/family education    PT Goals (Current goals can be found in the Care Plan section)  Acute Rehab PT Goals Patient Stated Goal: go home and not rehab facility PT Goal Formulation: With patient/family Time For Goal Achievement: 02/19/16 Potential to Achieve Goals: Fair    Frequency Min 3X/week   Barriers to discharge        Co-evaluation               End of Session Equipment Utilized During Treatment: Gait belt Activity Tolerance: Patient limited by fatigue Patient left: in chair;with call bell/phone within reach;with family/visitor present;Other (comment) (daughter aware not to leave pt) Nurse Communication: Mobility status    Functional Assessment Tool Used: clinical judgement and objective findings Functional Limitation: Changing and maintaining body position Changing and Maintaining  Body Position Current Status (Z6109(G8981): At least 40 percent but less than 60 percent impaired, limited or restricted Changing and Maintaining Body Position Goal Status (U0454(G8982): At least 20 percent but less than 40 percent impaired, limited or restricted    Time: 1246-1316 PT Time Calculation (min) (ACUTE ONLY): 30 min   Charges:   PT Evaluation $PT Eval Moderate Complexity: 1 Procedure PT Treatments $Therapeutic Activity: 8-22 mins   PT G Codes:   PT G-Codes **NOT FOR INPATIENT CLASS** Functional Assessment Tool Used: clinical judgement and objective findings Functional Limitation: Changing and maintaining body position Changing and Maintaining Body Position Current Status (U9811(G8981): At least 40 percent but less than 60 percent impaired, limited or restricted Changing and Maintaining Body Position Goal Status (B1478(G8982): At least 20 percent but less than 40 percent impaired, limited or restricted    Ernan Runkles LUBECK 02/05/2016, 1:40 PM

## 2016-02-05 NOTE — Progress Notes (Signed)
Pt rolling in bed d/t pain in right hip. HR 130's-140's. Pain med not available at this time. Paged Dr Caleb PoppNettey. Repositioned pt for comfort. Waiting to hear back from dr.

## 2016-02-05 NOTE — Progress Notes (Signed)
OT Cancellation Note  Patient Details Name: Tracy [name] MRN: [id number] DOB: [birth year]   Cancelled Treatment:    Reason Eval/Treat Not Completed: Medical issues which prohibited therapy. Pt with elevated HR 130-140s at rest, Spoke with RN and will hold off for now. Will re attempt later today as able/as appropriate, if not then tomorrow  Galen ManilaSpencer, Anup Brigham Jeanette 02/05/2016, 10:06 AM

## 2016-02-05 NOTE — Evaluation (Signed)
Occupational Therapy Evaluation Patient Details Name: Tracy [name] MRN: [id number] DOB: December 31, 1926 Today's Date: 02/05/2016    History of Present Illness Tracy FinerOphelia W Lovett is a 82 y.o. female with medical history significant of HTN, a.fib.  PT with recent fall and c/o R hip pain which is (-) for fracture. Patient presents to the ED with c/o 10 day history of generalized weakness.  Couple of days ago patient was sleeping all day and night.  No specific CP or SOB, just has no energy in general.  Nothing makes better or worse, no specific reports of melena or blood in stool.  Took BP at home a couple of days ago and was running 88 systolic.   Clinical Impression   Pt with decline in function and safety with ADLs and ADL mobility with decreased strength, balance and endurance. Pt began vomiting while eating her lunch and RN called to her room. Pt was returned to bed. Pt would benefit from acute OT services to address impairments to increase level of function and safety    Follow Up Recommendations  Supervision/Assistance - 24 hour;Home health OT    Equipment Recommendations  Tub/shower bench    Recommendations for Other Services       Precautions / Restrictions Precautions Precautions: Fall Restrictions Weight Bearing Restrictions: No      Mobility Bed Mobility Overal bed mobility: Needs Assistance Bed Mobility: Sit to Supine     Supine to sit: Min guard Sit to supine: Mod assist   General bed mobility comments: mod A with LEs back onto bed  Transfers Overall transfer level: Needs assistance Equipment used: Rolling walker (2 wheeled) Transfers: Sit to/from [employer] CorporationStand;Stand Pivot Transfers Sit to Stand: Mod assist;Max assist Stand pivot transfers: Mod assist       General transfer comment: Pt stood 2x with RW and demonstrated posterior lean and extremely weak legs, just kind of bouncing and had to sit down for safety.  On 3rd attempt, focused on just standing and pushing  UE down thru RW, then performed SPT with shuffled steps to recliner.    Balance Overall balance assessment: History of Falls;Needs assistance   Sitting balance-Leahy Scale: Fair       Standing balance-Leahy Scale: Zero Standing balance comment: Extreme weakness noted in standing with posterior lean and then she would try and compensate and trunk was moving allover the place                            ADL Overall ADL's : Needs assistance/impaired     Grooming: Wash/dry hands;Wash/dry face;Sitting;Set up   Upper Body Bathing: Set up;Sitting   Lower Body Bathing: Maximal assistance   Upper Body Dressing : Set up;Sitting   Lower Body Dressing: Maximal assistance   Toilet Transfer: Moderate assistance;Maximal assistance;Stand-pivot StatisticianToilet Transfer Details (indicate cue type and reason): simulated Toileting- Clothing Manipulation and Hygiene: Maximal assistance       Functional mobility during ADLs: Moderate assistance;Maximal assistance General ADL Comments: pt very unsteady. Per daughter pt sink bathes at home due to having a tub shower. Educated pt and daughter on use of tub bench at home     Vision  no change from baseline              Pertinent Vitals/Pain Pain Assessment: 0-10 Pain Score: 7  Faces Pain Scale: Hurts whole lot Pain Location: R hip Pain Descriptors / Indicators: Grimacing;Guarding Pain Intervention(s): Limited activity within patient's tolerance;Monitored during  session     Hand Dominance Right   Extremity/Trunk Assessment Upper Extremity Assessment Upper Extremity Assessment: Generalized weakness   Lower Extremity Assessment Lower Extremity Assessment: LLE deficits/detail RLE Deficits / Details: c/o severe pain in R LE. in supine, she had leg fully flexed and hip and knee.  MMT hip flex 3-/5, quads 4/5 LLE Deficits / Details: hip flex 3+/5, quads 4/5       Communication Communication Communication: No difficulties    Cognition Arousal/Alertness: Awake/alert Behavior During Therapy: WFL for tasks assessed/performed Overall Cognitive Status: Within Functional Limits for tasks assessed                 General Comments: Pt oriented, but easily distracted.  Messing with her o2 and heart monitor, but follows all instructions.   General Comments   Pt very pleasant and cooperative                 Home Living Family/patient expects to be discharged to:: Private residence Living Arrangements: Children Available Help at Discharge: Family;Available 24 hours/day Type of Home: House Home Access: Level entry     Home Layout: One level     Bathroom Shower/Tub: Chief Strategy OfficerTub/shower unit   Bathroom Toilet: Standard     Home Equipment: Bedside commode;Walker - 4 wheels;Walker - 2 wheels          Prior Functioning/Environment Level of Independence: Needs assistance  Gait / Transfers Assistance Needed: in last couple weeks has needed MOD A with gait due to R leg hurting, prior to that was MOD I with rollator              OT Problem List: Decreased strength;Impaired balance (sitting and/or standing);Decreased activity tolerance;Decreased knowledge of use of DME or AE   OT Treatment/Interventions: Self-care/ADL training;DME and/or AE instruction;Therapeutic activities;Patient/family education    OT Goals(Current goals can be found in the care plan section) Acute Rehab OT Goals Patient Stated Goal: go home and not rehab facility OT Goal Formulation: With patient/family Time For Goal Achievement: 02/12/16 Potential to Achieve Goals: Good ADL Goals Pt Will Perform Grooming: with min assist;standing;with caregiver independent in assisting Pt Will Perform Lower Body Bathing: with mod assist;sitting/lateral leans;sit to/from stand;with caregiver independent in assisting Pt Will Perform Lower Body Dressing: with mod assist;with caregiver independent in assisting;sitting/lateral leans;sit to/from  stand Pt Will Transfer to Toilet: with mod assist;with min assist;regular height toilet;bedside commode;grab bars;ambulating Pt Will Perform Toileting - Clothing Manipulation and hygiene: with mod assist;with caregiver independent in assisting;sitting/lateral leans;sit to/from stand Pt Will Perform Tub/Shower Transfer: with mod assist;with min assist;tub bench  OT Frequency: Min 2X/week   Barriers to D/C:    no barriers                     End of Session Equipment Utilized During Treatment: Gait belt  Activity Tolerance: Patient limited by fatigue Patient left: in bed;with call bell/phone within reach;with nursing/sitter in room;with family/visitor present   Time: [id number] OT Time Calculation (min): 20 min Charges:  OT General Charges $OT Visit: 1 Procedure OT Evaluation $OT Eval Moderate Complexity: 1 Procedure G-Codes: OT G-codes **NOT FOR INPATIENT CLASS** Functional Assessment Tool Used: clinical judgement Functional Limitation: Self care Self Care Current Status (V4098(G8987): At least 40 percent but less than 60 percent impaired, limited or restricted Self Care Goal Status (J1914(G8988): At least 20 percent but less than 40 percent impaired, limited or restricted  Galen ManilaSpencer, Jayron Maqueda Jeanette 02/05/2016, 2:13 PM

## 2016-02-05 NOTE — Progress Notes (Signed)
PROGRESS NOTE    Tracy [name]  NGE:[id number] DOB: [name] DOA: 02/04/2016 PCP: Oliver BarreJames John, MD   Brief Narrative: Tracy [name] is a 82 y.o. female with medical history significant of HTN, a.fib.   Assessment & Plan:   Principal Problem:   Weakness Active Problems:   Essential hypertension   A-fib (HCC)   Anemia   Hypercalcemia   AKI (acute kidney injury) (HCC)   Weakness Likely secondary to anemia, AKI and hip pain.  Anemia Unknown cause. Patient is s/p 2u PRBC. Hemoglobin improved today. MCV normal. No evidence of GI bleeding. Low iron with normal TIBC and low normal ferritin -monitor  AKI Improved slightly. Has been receiving fluids by mouth. Patient looks euvolemic  Right hip pain Does not appear that this is secondary to patient's fall. No evidence of fracture on CT. -MRI lumbar spine  Hypertension Slightly elevated. -will restart amlodipine  Hypercalcemia Improved today. PTH pending. Possibly secondary to acute renal failure.   DVT prophylaxis: Lovenox subq Code Status: Full code Family Communication: Two sons at bedside Disposition Plan: Likely discharge in 1-2 days   Consultants:   None  Procedures:  None  Antimicrobials:  None    Subjective: Patient reports some pain in her right hip.  Objective: Vitals:   02/05/16 0125 02/05/16 0431 02/05/16 0455 02/05/16 0755  BP: 137/65 (!) 145/74 138/81 (!) 154/93  Pulse: 86 95 86 86  Resp: 18 16 18 18   Temp: 98.3 F (36.8 C) 98.3 F (36.8 C) 98.2 F (36.8 C) 98.3 F (36.8 C)  TempSrc: Oral Oral Oral Oral  SpO2: 93% 93% 96% 92%  Weight:      Height:        Intake/Output Summary (Last 24 hours) at 02/05/16 1332 Last data filed at 02/05/16 1227  Gross per 24 hour  Intake             1955 ml  Output                0 ml  Net             1955 ml   Filed Weights   02/04/16 2227  Weight: 50.6 kg (111 lb 8 oz)    Examination:  General exam: Appears calm and  comfortable Respiratory system: Clear to auscultation. Respiratory effort normal. Cardiovascular system: S1 & S2 heard, RRR. No murmurs, rubs, gallops or clicks. Gastrointestinal system: Abdomen is nondistended, soft and nontender. Normal bowel sounds heard. Central nervous system: Alert and oriented. No focal neurological deficits. Extremities: No edema. No calf tenderness. Some tenderness over right ischium Skin: No cyanosis. No rashes Psychiatry: Judgement and insight appear normal. Mood & affect appropriate.     Data Reviewed: I have personally reviewed following labs and imaging studies  CBC:  Recent Labs Lab 02/04/16 1718 02/05/16 0944  WBC 10.6* 16.9*  HGB 7.6* 11.3*  HCT 24.2* 34.9*  MCV 92.4 89.3  PLT 371 397   Basic Metabolic Panel:  Recent Labs Lab 02/04/16 1718 02/05/16 0944  NA 141 143  K 4.7 4.6  CL 103 109  CO2 31 24  GLUCOSE 139* 137*  BUN 34* 34*  CREATININE 1.95* 1.90*  CALCIUM 11.5* 11.1*   GFR: Estimated Creatinine Clearance: 14.5 mL/min (by C-G formula based on SCr of 1.9 mg/dL (H)). Liver Function Tests: No results for input(s): AST, ALT, ALKPHOS, BILITOT, PROT, ALBUMIN in the last 168 hours. No results for input(s): LIPASE, AMYLASE in the last 168 hours. No results  for input(s): AMMONIA in the last 168 hours. Coagulation Profile: No results for input(s): INR, PROTIME in the last 168 hours. Cardiac Enzymes:  Recent Labs Lab 02/04/16 2143  CKTOTAL 30*   BNP (last 3 results) No results for input(s): PROBNP in the last 8760 hours. HbA1C: No results for input(s): HGBA1C in the last 72 hours. CBG: No results for input(s): GLUCAP in the last 168 hours. Lipid Profile: No results for input(s): CHOL, HDL, LDLCALC, TRIG, CHOLHDL, LDLDIRECT in the last 72 hours. Thyroid Function Tests: No results for input(s): TSH, T4TOTAL, FREET4, T3FREE, THYROIDAB in the last 72 hours. Anemia Panel:  Recent Labs  02/04/16 2143  VITAMINB12 1,468*   FOLATE 54.3  FERRITIN 21  TIBC 395  IRON 20*  RETICCTPCT 6.1*   Sepsis Labs: No results for input(s): PROCALCITON, LATICACIDVEN in the last 168 hours.  No results found for this or any previous visit (from the past 240 hour(s)).       Radiology Studies: Dg Chest 2 View  Result Date: 02/04/2016 CLINICAL DATA:  Right leg pain with left foot swelling. Irregular heart rate EXAM: CHEST  2 VIEW COMPARISON:  04/28/2010 FINDINGS: The lungs are hyperinflated. Tiny bilateral pleural effusions or thickening. Mild cardiomegaly without overt failure. Atherosclerosis of the aorta. No pneumothorax. Multiple calcific opacities bilaterally, grossly unchanged. There is a small hiatal hernia. A sclerotic lesion the proximal right humerus is also unchanged. IMPRESSION: 1. Hyperinflation with tiny bilateral CP angle effusion or thickening 2. Stable mild cardiomegaly without overt failure. Atherosclerosis of the aorta. 3. Stable calcified pleural plaques. 4. Stable sclerotic lesion in the proximal right humerus. 5. Small hiatal hernia Electronically Signed   By: Jasmine PangKim  Fujinaga M.D.   On: 02/04/2016 18:14   Ct Hip Right Wo Contrast  Result Date: 02/04/2016 CLINICAL DATA:  Fall with right posterior hip pain EXAM: CT OF THE RIGHT HIP WITHOUT CONTRAST TECHNIQUE: Multidetector CT imaging of the right hip was performed according to the standard protocol. Multiplanar CT image reconstructions were also generated. COMPARISON:  None. FINDINGS: Bones/Joint/Cartilage There is motion artifact which limits the exam. There is no definite acute displaced fracture of the right pelvis. The right femoral head appears normally positioned. The pubic rami appear intact. The pubic symphysis is without fracture or diode stasis. There are scattered nonspecific sclerotic foci within the right pelvic tones. Ligaments Suboptimally assessed by CT. Muscles and Tendons Mild fatty atrophy of the right hip musculature. Soft tissues Partially  visualized right iliac stent. Densely calcified right iliac vessels. Moderate subcutaneous soft tissue swelling, partially visualized within the upper posterior thigh. IMPRESSION: 1. Motion artifact limits the exam 2. There is no gross fracture or malalignment 3. Partially visualized subcutaneous soft tissue swelling right posterior upper thigh and gluteal region. Electronically Signed   By: Jasmine PangKim  Fujinaga M.D.   On: 02/04/2016 21:18        Scheduled Meds: . aspirin  81 mg Oral Daily  . atorvastatin  5 mg Oral q1800  . enoxaparin (LOVENOX) injection  40 mg Subcutaneous QHS  . fenofibrate  160 mg Oral Daily  . gabapentin  200 mg Oral TID  . multivitamin with minerals  1 tablet Oral Daily  . oxybutynin  5 mg Oral Daily  . pentoxifylline  400 mg Oral Daily   Continuous Infusions:    LOS: 0 days     Jacquelin HawkingRalph Jakaree Pickard [facility] 02/05/2016, 1:32 PM Pager: [phone]  If 7PM-7AM, please contact night-coverage [email or web address] Password [email or web address] 02/05/2016, 1:32 PM

## 2016-02-05 NOTE — Care Management Obs Status (Signed)
MEDICARE OBSERVATION STATUS NOTIFICATION   Patient Details  Name: Tracy [name] MRN: [id number] Date of Birth: May 07, 1926   Medicare Observation Status Notification Given:  Yes    Lawerance Sabalebbie Awais Cobarrubias, RN 02/05/2016, 1:55 PM

## 2016-02-06 DIAGNOSIS — R531 Weakness: Secondary | ICD-10-CM | POA: Diagnosis not present

## 2016-02-06 DIAGNOSIS — I482 Chronic atrial fibrillation: Secondary | ICD-10-CM | POA: Diagnosis not present

## 2016-02-06 DIAGNOSIS — N179 Acute kidney failure, unspecified: Secondary | ICD-10-CM | POA: Diagnosis not present

## 2016-02-06 DIAGNOSIS — D649 Anemia, unspecified: Secondary | ICD-10-CM | POA: Diagnosis not present

## 2016-02-06 LAB — TYPE AND SCREEN
ABO/RH(D): O NEG
ANTIBODY SCREEN: NEGATIVE
UNIT DIVISION: 0
Unit division: 0
Unit division: 0
Unit division: 0

## 2016-02-06 LAB — CBC
HCT: 31.4 % — ABNORMAL LOW (ref 36.0–46.0)
HEMOGLOBIN: 10.1 g/dL — AB (ref 12.0–15.0)
MCH: 28.4 pg (ref 26.0–34.0)
MCHC: 32.2 g/dL (ref 30.0–36.0)
MCV: 88.2 fL (ref 78.0–100.0)
Platelets: 339 10*3/uL (ref 150–400)
RBC: 3.56 MIL/uL — ABNORMAL LOW (ref 3.87–5.11)
RDW: 19.2 % — ABNORMAL HIGH (ref 11.5–15.5)
WBC: 11 10*3/uL — AB (ref 4.0–10.5)

## 2016-02-06 LAB — BASIC METABOLIC PANEL
Anion gap: 8 (ref 5–15)
Anion gap: 9 (ref 5–15)
BUN: 36 mg/dL — ABNORMAL HIGH (ref 6–20)
BUN: 33 mg/dL — ABNORMAL HIGH (ref 6–20)
CALCIUM: 10.4 mg/dL — AB (ref 8.9–10.3)
CALCIUM: 10 mg/dL (ref 8.9–10.3)
CHLORIDE: 106 mmol/L (ref 101–111)
CHLORIDE: 107 mmol/L (ref 101–111)
CO2: 27 mmol/L (ref 22–32)
CO2: 25 mmol/L (ref 22–32)
CREATININE: 1.91 mg/dL — AB (ref 0.44–1.00)
CREATININE: 1.73 mg/dL — AB (ref 0.44–1.00)
GFR calc non Af Amer: 23 mL/min — ABNORMAL LOW (ref >60)
GFR calc non Af Amer: 26 mL/min — ABNORMAL LOW (ref >60)
GFR, EST AFRICAN AMERICAN: 26 mL/min — AB (ref >60)
GFR, EST AFRICAN AMERICAN: 30 mL/min — AB (ref >60)
Glucose, Bld: 84 mg/dL (ref 65–99)
Glucose, Bld: 84 mg/dL (ref 65–99)
Potassium: 4.6 mmol/L (ref 3.5–5.1)
Potassium: 4.6 mmol/L (ref 3.5–5.1)
SODIUM: 141 mmol/L (ref 135–145)
SODIUM: 141 mmol/L (ref 135–145)

## 2016-02-06 LAB — PTH, INTACT AND CALCIUM
Calcium, Total (PTH): 10.9 mg/dL — ABNORMAL HIGH (ref 8.7–10.3)
PTH: 13 pg/mL — ABNORMAL LOW (ref 15–65)

## 2016-02-06 LAB — ALBUMIN: ALBUMIN: 2.5 g/dL — AB (ref 3.5–5.0)

## 2016-02-06 MED ORDER — METHYLPREDNISOLONE SODIUM SUCC 125 MG IJ SOLR
60.0000 mg | INTRAMUSCULAR | Status: DC
  Administered 2016-02-06 – 2016-02-07 (×2): 60 mg via INTRAVENOUS
  Filled 2016-02-06 (×2): qty 2

## 2016-02-06 MED ORDER — ENSURE ENLIVE PO LIQD
237.0000 mL | Freq: Two times a day (BID) | ORAL | Status: DC
  Administered 2016-02-07: 237 mL via ORAL

## 2016-02-06 MED ORDER — SODIUM CHLORIDE 0.9 % IV SOLN: INTRAVENOUS | Status: DC

## 2016-02-06 MED ORDER — SODIUM CHLORIDE 0.9 % IV BOLUS (SEPSIS)
500.0000 mL | Freq: Once | INTRAVENOUS | Status: AC
  Administered 2016-02-06: 500 mL via INTRAVENOUS

## 2016-02-06 NOTE — Care Management Note (Signed)
Case Management Note  Patient Details  Name: Tracy [name] MRN: [id number] Date of Birth: November 20, 1926  Subjective/Objective:                 Patient from home with son. Has used [facility] in the past. [facility] to watch for orders at DC. DC likely over the weekend. Patient on and off oxygen, non dependent at home.    Action/Plan:  Anticipate DC to home with HH, watch for home O2 needs. CM will continue to follow.   Expected Discharge Date:                  Expected Discharge Plan:  Home w Home Health Services  In-House Referral:  NA  Discharge planning Services  CM Consult  Post Acute Care Choice:    Choice offered to:     DME Arranged:    DME Agency:     HH Arranged:    HH Agency:     Status of Service:  In process, will continue to follow  If discussed at Long Length of Stay Meetings, dates discussed:    Additional Comments:  Lawerance SabalDebbie Shatoria [place], RN 02/06/2016, 3:42 PM

## 2016-02-06 NOTE — Progress Notes (Signed)
   02/06/16 1230  What Happened  Was fall witnessed? Yes  Who witnessed fall? Daughter  Patients activity before fall other (comment) (slid from chair to floor)  Point of contact buttocks  Was patient injured? No  Follow Up  MD notified Nettey  Time MD notified [id number]  Family notified Yes-comment (daughter at bedside)  Time family notified (immediately)  Additional tests No  Progress note created (see row info) Yes  Adult Fall Risk Assessment  Risk Factor Category (scoring not indicated) High fall risk per protocol (document High fall risk)  Patient's Fall Risk High Fall Risk (>13 points)  Adult Fall Risk Interventions  Required Bundle Interventions *See Row Information* High fall risk - low, moderate, and high requirements implemented  Additional Interventions Family Supervision;Individualized elimination schedule  Screening for Fall Injury Risk  Risk For Fall Injury- See Row Information  Nurse judgement;D  Injury Prevention Interventions Family supervision  Vitals  Temp 97.9 F (36.6 C)  Temp Source Oral  BP (!) 143/77  MAP (mmHg) 91  BP Location Left Arm  BP Method Automatic  Patient Position (if appropriate) Lying  Pulse Rate (!) 110  Pulse Rate Source Monitor  Resp 16  Oxygen Therapy  SpO2 92 %  O2 Device Room Air

## 2016-02-06 NOTE — Progress Notes (Signed)
Occupational Therapy Treatment Patient Details Name: Tracy [name] MRN: [id number] DOB: [birth year] Today's Date: 02/06/2016    History of present illness Tracy GilbertOphelia W Kathlene [name] is a 82 y.o. female with medical history significant of HTN, a.fib.  PT with recent fall and c/o R hip pain which is (-) for fracture. Patient presents to the ED with c/o 10 day history of generalized weakness.  Couple of days ago patient was sleeping all day and night.  No specific CP or SOB, just has no energy in general.  Nothing makes better or worse, no specific reports of melena or blood in stool.  Took BP at home a couple of days ago and was running 88 systolic.   OT comments  Pt making slow progress with functional goals. Pt's daughter reports that pt continues to vomit after eating and or taking meds  Follow Up Recommendations  Supervision/Assistance - 24 hour;Home health OT    Equipment Recommendations  Tub/shower bench    Recommendations for Other Services      Precautions / Restrictions Precautions Precautions: Fall Restrictions Weight Bearing Restrictions: No       Mobility Bed Mobility Overal bed mobility: Needs Assistance Bed Mobility: Sit to Supine     Supine to sit: Supervision;HOB elevated     General bed mobility comments: used rails, cues to scoot to EOB  Transfers Overall transfer level: Needs assistance Equipment used: Rolling walker (2 wheeled) Transfers: Sit to/from [employer] CorporationStand;Stand Pivot Transfers Sit to Stand: Mod assist Stand pivot transfers: Mod assist            Balance Overall balance assessment: History of Falls;Needs assistance   Sitting balance-Leahy Scale: Fair         Standing balance comment: posterior lean                   ADL       Grooming: Wash/dry hands;Wash/dry face;Sitting;Set up                   Toilet Transfer: Moderate assistance;Stand-pivot;BSC   Toileting- Clothing Manipulation and Hygiene: Maximal assistance        Functional mobility during ADLs: Moderate assistance General ADL Comments: pt sat EOB x 10 minutes for activity prior to transfers      Vision  no change from baseline                              Cognition   Behavior [facility] for tasks assessed/performed Overall Cognitive Status: Within Functional Limits for tasks assessed                                                  General Comments  pt very pleasant and cooperative    Pertinent Vitals/ Pain       Pain Assessment: 0-10 Pain Score: 5  Pain Location: back Pain Descriptors / Indicators: Sore;Aching Pain Intervention(s): Limited activity within patient's tolerance;Monitored during session;Repositioned                                                          Frequency  Min 2X/week  Progress Toward Goals  OT Goals(current goals can now be found in the care plan section)  Progress towards OT goals: Progressing toward goals     Plan Discharge plan remains appropriate                     End of Session Equipment Utilized During Treatment: Gait belt   Activity Tolerance Patient tolerated treatment well   Patient Left with call bell/phone within reach;with family/visitor present;in chair         Functional Assessment Tool Used: clinical judgement Functional Limitation: Self care Self Care Current Status (D6387(G8987): At least 40 percent but less than 60 percent impaired, limited or restricted Self Care Goal Status (F6433(G8988): At least 20 percent but less than 40 percent impaired, limited or restricted   Time: [id number] OT Time Calculation (min): 23 min  Charges: OT G-codes **NOT FOR INPATIENT CLASS** Functional Assessment Tool Used: clinical judgement Functional Limitation: Self care Self Care Current Status (Y6063(G8987): At least 40 percent but less than 60 percent impaired, limited or restricted Self Care Goal Status (K1601(G8988): At least  20 percent but less than 40 percent impaired, limited or restricted OT General Charges $OT Visit: 1 Procedure OT Treatments $Self Care/Home Management : 8-22 mins $Therapeutic Activity: 8-22 mins  Tracy [name], Tracy [name] 02/06/2016, 12:53 PM

## 2016-02-06 NOTE — Progress Notes (Signed)
Patient's daughter trying to contact her brother to [facility] in [facility] past.

## 2016-02-06 NOTE — Progress Notes (Signed)
PROGRESS NOTE    Tracy [name]  ZOX:[id number] DOB: 1926/08/28 DOA: 02/04/2016 PCP: Oliver Barre, MD   Brief Narrative: Tracy [name] is a 82 y.o. female with medical history significant of HTN, a.fib.   Assessment & Plan:   Principal Problem:   Weakness Active Problems:   Essential hypertension   A-fib (HCC)   Anemia   Hypercalcemia   AKI (acute kidney injury) (HCC)   Weakness Likely secondary to anemia, AKI and hip pain. PT recommending home health physical therapy.   Anemia Unknown cause. Patient is s/p 2u PRBC. Hemoglobin improved today. MCV normal. No evidence of GI bleeding. Low iron with normal TIBC and low normal ferritin -monitor  AKI Appears secondary to prerenal azotemia. Improved with bolus from this morning -increase oral fluid intake  Right hip pain Likely secondary to radiculopathy from severe lumbar spine pathology, including stenoses, disc bulge and degeneration. Discuss with Dr. Wynetta Emery who recommends systemic steroids, PT and muscle relaxer and will see in clinic. -symptomatic management  Hypertension Slightly elevated. -will restart amlodipine  Hypercalcemia Normal today. Unsure of why this was elevated. PTH was low indicating non-PTH reason for hypercalcemia. -albumin -vitamin D   DVT prophylaxis: Lovenox subq Code Status: Full code Family Communication: Son and daughter at bedside Disposition Plan: Likely discharge tomorrow   Consultants:   None  Procedures:  None  Antimicrobials:  None    Subjective: Pain in right hip. No other complaints  Objective: Vitals:   02/06/16 0713 02/06/16 1032 02/06/16 1127 02/06/16 1230  BP:  (!) 142/87 (!) 150/86 (!) 143/77  Pulse:   85 (!) 110  Resp:    16  Temp:    97.9 F (36.6 C)  TempSrc:    Oral  SpO2: 100%   92%  Weight:      Height:       No intake or output data in the 24 hours ending 02/06/16 1526 Filed Weights   02/04/16 2227  Weight: 50.6 kg (111 lb 8 oz)     Examination:  General exam: Appears calm and comfortable Respiratory system: Clear to auscultation. Respiratory effort normal. Cardiovascular system: S1 & S2 heard, RRR. No murmurs, rubs, gallops or clicks. Gastrointestinal system: Abdomen is nondistended, soft and nontender. Normal bowel sounds heard. Central nervous system: Alert and oriented. Some slurred speech. 4/5 strength throughout Extremities: No edema. No calf tenderness. Some tenderness over right ischium Skin: No cyanosis. No rashes Psychiatry: Judgement and insight appear normal. Mood & affect appropriate.     Data Reviewed: I have personally reviewed following labs and imaging studies  CBC:  Recent Labs Lab 02/04/16 1718 02/05/16 0944 02/06/16 0258  WBC 10.6* 16.9* 11.0*  HGB 7.6* 11.3* 10.1*  HCT 24.2* 34.9* 31.4*  MCV 92.4 89.3 88.2  PLT 371 397 339   Basic Metabolic Panel:  Recent Labs Lab 02/04/16 1718 02/04/16 2143 02/05/16 0944 02/06/16 0258 02/06/16 1358  NA 141  --  143 141 141  K 4.7  --  4.6 4.6 4.6  CL 103  --  109 106 107  CO2 31  --  24 27 25   GLUCOSE 139*  --  137* 84 84  BUN 34*  --  34* 36* 33*  CREATININE 1.95*  --  1.90* 1.91* 1.73*  CALCIUM 11.5* 10.9* 11.1* 10.4* 10.0   GFR: Estimated Creatinine Clearance: 15.9 mL/min (by C-G formula based on SCr of 1.73 mg/dL (H)). Liver Function Tests: No results for input(s): AST, ALT, ALKPHOS, BILITOT, PROT,  ALBUMIN in the last 168 hours. No results for input(s): LIPASE, AMYLASE in the last 168 hours. No results for input(s): AMMONIA in the last 168 hours. Coagulation Profile: No results for input(s): INR, PROTIME in the last 168 hours. Cardiac Enzymes:  Recent Labs Lab 02/04/16 2143  CKTOTAL 30*   BNP (last 3 results) No results for input(s): PROBNP in the last 8760 hours. HbA1C: No results for input(s): HGBA1C in the last 72 hours. CBG: No results for input(s): GLUCAP in the last 168 hours. Lipid Profile: No results for  input(s): CHOL, HDL, LDLCALC, TRIG, CHOLHDL, LDLDIRECT in the last 72 hours. Thyroid Function Tests: No results for input(s): TSH, T4TOTAL, FREET4, T3FREE, THYROIDAB in the last 72 hours. Anemia Panel:  Recent Labs  02/04/16 2143  VITAMINB12 1,468*  FOLATE 54.3  FERRITIN 21  TIBC 395  IRON 20*  RETICCTPCT 6.1*   Sepsis Labs: No results for input(s): PROCALCITON, LATICACIDVEN in the last 168 hours.  No results found for this or any previous visit (from the past 240 hour(s)).       Radiology Studies: Dg Chest 2 View  Result Date: 02/04/2016 CLINICAL DATA:  Right leg pain with left foot swelling. Irregular heart rate EXAM: CHEST  2 VIEW COMPARISON:  04/28/2010 FINDINGS: The lungs are hyperinflated. Tiny bilateral pleural effusions or thickening. Mild cardiomegaly without overt failure. Atherosclerosis of the aorta. No pneumothorax. Multiple calcific opacities bilaterally, grossly unchanged. There is a small hiatal hernia. A sclerotic lesion the proximal right humerus is also unchanged. IMPRESSION: 1. Hyperinflation with tiny bilateral CP angle effusion or thickening 2. Stable mild cardiomegaly without overt failure. Atherosclerosis of the aorta. 3. Stable calcified pleural plaques. 4. Stable sclerotic lesion in the proximal right humerus. 5. Small hiatal hernia Electronically Signed   By: Jasmine PangKim  Fujinaga M.D.   On: 02/04/2016 18:14   Mr Lumbar Spine Wo Contrast  Result Date: 02/05/2016 CLINICAL DATA:  Ten days of general weakness, somnolent. Recent fall with RIGHT hip pain. EXAM: MRI LUMBAR SPINE WITHOUT CONTRAST TECHNIQUE: Multiplanar, multisequence MR imaging of the lumbar spine was performed. No intravenous contrast was administered. GFR 26. As patient looped in between sequences, at the sagittal STIR does not correspond to the sagittal T1 and T2 can the axial sequences in not car register to the sagittal sequences. COMPARISON:  None. FINDINGS: SEGMENTATION: For the purposes of this  report, the last well-formed intervertebral disc will be described as L5-S1. ALIGNMENT: Maintenance of the lumbar lordosis. Minimal grade 1 L2-3, L3-4, L4-5 and L5-S1 anterolisthesis. No definite spondylolysis. VERTEBRAE:Vertebral bodies are intact. Severe L5-S1 disc height loss with severe acute on chronic discogenic endplate changes. The mild L2-3 disc height loss. Disc desiccation all lumbar levels. T1 and T2 bright bone marrow signal compatible with osteopenia. No suspicious bone marrow signal. CONUS MEDULLARIS: Conus medullaris terminates at L1-2 and demonstrates normal morphology and signal characteristics. Cauda equina is normal. PARASPINAL AND SOFT TISSUES: Included prevertebral and paraspinal soft tissues are nonacute. Severe sigmoid diverticulosis. Moderate paraspinal muscle atrophy. DISC LEVELS (patient moved P4 the axial sequences which do not well coregister to the sagittal sequences): L1-2: No disc bulge, canal stenosis nor neural foraminal narrowing. Mild facet arthropathy and ligamentum flavum redundancy. L2-3: Anterolisthesis. Moderate broad-based disc bulge with small LEFT central suspected disc extrusion, difficult to quantify due to slice selection. Mild facet arthropathy and ligamentum flavum redundancy with trace facet effusions which are likely reactive. Moderate canal stenosis. Mild RIGHT, moderate to severe LEFT neural foraminal narrowing. L3-4: Anterolisthesis. RIGHT  subarticular and/or extra foramina disc extrusion difficult to quantify due to patient motion with 11 mm of contiguous superior migration. RIGHT extra foraminal abnormal soft tissue, with T2 bright signal within the exited RIGHT L3 nerve. Moderate broad-based disc bulge. Mild facet arthropathy and ligamentum flavum redundancy with trace facet effusions which are likely reactive. Moderate canal stenosis. Moderate to severe RIGHT neural foraminal narrowing. L4-5: Anterolisthesis. Large broad-based disc bulge. Moderate facet  arthropathy and ligamentum flavum redundancy with trace facet effusions which are likely reactive. Severe canal stenosis. Moderate RIGHT neural foraminal narrowing. L5-S1: Anterolisthesis. Large broad-based disc bulge, mild facet arthropathy and ligamentum flavum redundancy. Mild canal stenosis including partially effaced RIGHT lateral recess which may affect the traversing RIGHT S1 nerve. Moderate to severe RIGHT, severe LEFT neural foraminal narrowing. IMPRESSION: Limited assessment due to patient moving between sequences. No acute fracture. Grade 1 minimal L2-3 through L5-S1 anterolisthesis on degenerative basis. RIGHT L3-4 subarticular and/or extra foraminal disc extrusion RIGHT L3 neuritis, moderate to severe RIGHT L3-4 neural foraminal narrowing. Severe canal stenosis L4-5.  Moderate canal stenosis L2-3 and L3-4. Neural foraminal narrowing L2-3 through L5-S1: Severe on the LEFT at L5-S1. Electronically Signed   By: Awilda Metroourtnay  Bloomer M.D.   On: 02/05/2016 22:33   Ct Hip Right Wo Contrast  Result Date: 02/04/2016 CLINICAL DATA:  Fall with right posterior hip pain EXAM: CT OF THE RIGHT HIP WITHOUT CONTRAST TECHNIQUE: Multidetector CT imaging of the right hip was performed according to the standard protocol. Multiplanar CT image reconstructions were also generated. COMPARISON:  None. FINDINGS: Bones/Joint/Cartilage There is motion artifact which limits the exam. There is no definite acute displaced fracture of the right pelvis. The right femoral head appears normally positioned. The pubic rami appear intact. The pubic symphysis is without fracture or diode stasis. There are scattered nonspecific sclerotic foci within the right pelvic tones. Ligaments Suboptimally assessed by CT. Muscles and Tendons Mild fatty atrophy of the right hip musculature. Soft tissues Partially visualized right iliac stent. Densely calcified right iliac vessels. Moderate subcutaneous soft tissue swelling, partially visualized within  the upper posterior thigh. IMPRESSION: 1. Motion artifact limits the exam 2. There is no gross fracture or malalignment 3. Partially visualized subcutaneous soft tissue swelling right posterior upper thigh and gluteal region. Electronically Signed   By: Jasmine PangKim  Fujinaga M.D.   On: 02/04/2016 21:18        Scheduled Meds: . amLODipine  5 mg Oral Daily  . aspirin  81 mg Oral Daily  . atorvastatin  5 mg Oral q1800  . enoxaparin (LOVENOX) injection  30 mg Subcutaneous QHS  . feeding supplement (ENSURE ENLIVE)  237 mL Oral BID BM  . fenofibrate  160 mg Oral Daily  . gabapentin  200 mg Oral TID  . methylPREDNISolone (SOLU-MEDROL) injection  60 mg Intravenous Q24H  . multivitamin with minerals  1 tablet Oral Daily  . oxybutynin  5 mg Oral Daily  . pentoxifylline  400 mg Oral Daily   Continuous Infusions:    LOS: 0 days     Jacquelin HawkingRalph Audie Stayer [facility] 02/06/2016, 3:26 PM Pager: [phone]  If 7PM-7AM, please contact night-coverage [email or web address] Password [email or web address] 02/06/2016, 3:26 PM

## 2016-02-06 NOTE — Progress Notes (Signed)
Initial Nutrition Assessment  DOCUMENTATION CODES:   Not applicable  INTERVENTION:    Ensure Enlive po BID, each supplement provides 350 kcal and 20 grams of protein  NUTRITION DIAGNOSIS:   Inadequate oral intake related to poor appetite as evidenced by per patient/family report  GOAL:   Patient will meet greater than or equal to 90% of their needs  MONITOR:   PO intake, Supplement acceptance, Labs, Weight trends, I & O's  REASON FOR ASSESSMENT:   Consult Assessment of nutrition requirement/status  ASSESSMENT:   82 y.o. Female with medical history significant of HTN, a.fib.  Patient presents to the ED with c/o 10 day history of generalized weakness.  Couple of days ago patient was sleeping all day and night.  No specific CP or SOB, just has no energy in general.  Nothing makes better or worse, no specific reports of melena or blood in stool.  Took BP at home a couple of days ago and was running 88 systolic.  Patient confused >> obtained nutrition hx from daughter. Reports pt lives with her brother (pt's son). Daughter reports her brother has been trying to give their Mom Ensure and/or Boost supplements. Per readings below, pt's weight has been stable. Daughter expressed concern about active vomiting >> discussed with RN.  RD unable to complete Nutrition Focused Physical Exam at this time.  Diet Order:  Diet Heart Room service appropriate? Yes; Fluid consistency: Thin  Skin:  Reviewed, no issues  Last BM:  10/30   Height:   Ht Readings from Last 1 Encounters:  02/04/16 4\' 11"  (1.499 m)    Weight:   Wt Readings from Last 1 Encounters:  02/04/16 111 lb 8 oz (50.6 kg)   Wt Readings from Last 10 Encounters:  02/04/16 111 lb 8 oz (50.6 kg)  01/06/16 105 lb (47.6 kg)  11/18/15 108 lb (49 kg)  10/22/15 106 lb (48.1 kg)  10/14/15 107 lb (48.5 kg)  08/01/15 105 lb (47.6 kg)  05/27/15 101 lb (45.8 kg)  05/21/15 104 lb (47.2 kg)  05/13/15 97 lb (44 kg)  05/06/15  100 lb (45.4 kg)    Ideal Body Weight:  44.6 kg  BMI:  Body mass index is 22.52 kg/m.  Estimated Nutritional Needs:   Kcal:  1250-1450  Protein:  60-70 gm  Fluid:  >/= 1.5 L  EDUCATION NEEDS:   No education needs identified at this time  Maureen ChattersKatie Azaliyah Kennard, RD, LDN Pager #: [phone] After-Hours Pager #: [phone]

## 2016-02-07 ENCOUNTER — Observation Stay (HOSPITAL_COMMUNITY): Payer: Medicare Other

## 2016-02-07 DIAGNOSIS — I481 Persistent atrial fibrillation: Secondary | ICD-10-CM

## 2016-02-07 DIAGNOSIS — D649 Anemia, unspecified: Secondary | ICD-10-CM | POA: Diagnosis not present

## 2016-02-07 DIAGNOSIS — N179 Acute kidney failure, unspecified: Secondary | ICD-10-CM | POA: Diagnosis not present

## 2016-02-07 DIAGNOSIS — R531 Weakness: Secondary | ICD-10-CM | POA: Diagnosis not present

## 2016-02-07 LAB — BASIC METABOLIC PANEL
ANION GAP: 9 (ref 5–15)
BUN: 32 mg/dL — ABNORMAL HIGH (ref 6–20)
CALCIUM: 9.8 mg/dL (ref 8.9–10.3)
CHLORIDE: 106 mmol/L (ref 101–111)
CO2: 25 mmol/L (ref 22–32)
Creatinine, Ser: 1.56 mg/dL — ABNORMAL HIGH (ref 0.44–1.00)
GFR calc non Af Amer: 29 mL/min — ABNORMAL LOW (ref >60)
GFR, EST AFRICAN AMERICAN: 34 mL/min — AB (ref >60)
Glucose, Bld: 114 mg/dL — ABNORMAL HIGH (ref 65–99)
Potassium: 4.5 mmol/L (ref 3.5–5.1)
Sodium: 140 mmol/L (ref 135–145)

## 2016-02-07 LAB — CBC
HCT: 32 % — ABNORMAL LOW (ref 36.0–46.0)
HEMOGLOBIN: 10.1 g/dL — AB (ref 12.0–15.0)
MCH: 28.2 pg (ref 26.0–34.0)
MCHC: 31.6 g/dL (ref 30.0–36.0)
MCV: 89.4 fL (ref 78.0–100.0)
Platelets: 352 10*3/uL (ref 150–400)
RBC: 3.58 MIL/uL — AB (ref 3.87–5.11)
RDW: 18.6 % — ABNORMAL HIGH (ref 11.5–15.5)
WBC: 9.1 10*3/uL (ref 4.0–10.5)

## 2016-02-07 LAB — VITAMIN D 25 HYDROXY (VIT D DEFICIENCY, FRACTURES): Vit D, 25-Hydroxy: 40.5 ng/mL (ref 30.0–100.0)

## 2016-02-07 MED ORDER — APIXABAN 2.5 MG PO TABS: 2.5000 mg | ORAL_TABLET | Freq: Two times a day (BID) | ORAL | 0 refills | Status: DC

## 2016-02-07 MED ORDER — AMLODIPINE BESYLATE 5 MG PO TABS: 10.0000 mg | ORAL_TABLET | Freq: Every day | ORAL | 0 refills | Status: DC

## 2016-02-07 MED ORDER — ENSURE ENLIVE PO LIQD: 237.0000 mL | Freq: Two times a day (BID) | ORAL | 1 refills | Status: DC

## 2016-02-07 MED ORDER — GABAPENTIN 100 MG PO CAPS: 200.0000 mg | ORAL_CAPSULE | Freq: Three times a day (TID) | ORAL | 0 refills | Status: DC

## 2016-02-07 MED ORDER — PREDNISONE 20 MG PO TABS: ORAL_TABLET | ORAL | 0 refills | Status: DC

## 2016-02-07 NOTE — Discharge Instructions (Signed)
[name], you were admitted because of your leg pain. You were found to have some kidney injury as well likely because of dehydration. Your kidney function improved. An MRI was done which showed no stroke, but your back MRI was significant for a lot of arthritis, degeneration and a disc bulge that is likely the reason for your symptoms. Please follow-up with the Neurosurgeon for this and continue the steroids at home. Physical therapy should be seeing you at home as well.   Your atrial fibrillation puts you at an increase risk for stroke. Since your GI bleed was over one year ago, I discussed with the cardiologist and he agreed to restart you on a blood thinner (Eliquis). Please take this twice per day. If you notice any bleeding, please return immediately for evaluation. Please follow-up with your primary care physician for close follow-up with regard to your atrial fibrillation.  Your blood count was low when you arrived, requiring a blood transfusion. There was no evidence of bleeding and your blood count has remained stable. It appears this may be secondary to iron deficiency. Please check your multivitamins to see if there is iron. If not, please talk to your primary care physician with regard to starting iron supplementation.  I am increasing your amlodipine to 10mg  and stopping your benazepril until your kidney function is rechecked by your primary care physician.  It was a pleasure taking care of you during your hospital stay. Please take care and I hope to see you again (outside of the hospital).  Sincerely,  Tracy [name] Lorik Guo, MD     Apixaban oral tablets What is this medicine? APIXABAN (a PIX a ban) is an anticoagulant (blood thinner). It is used to lower the chance of stroke in people with a medical condition called atrial fibrillation. It is also used to treat or prevent blood clots in the lungs or in the veins. This medicine may be used for other purposes; ask your health care provider  or pharmacist if you have questions. What should I tell my health care provider before I take this medicine? They need to know if you have any of these conditions: -bleeding disorders -bleeding in the brain -blood in your stools (black or tarry stools) or if you have blood in your vomit -history of stomach bleeding -kidney disease -liver disease -mechanical heart valve -an unusual or allergic reaction to apixaban, other medicines, foods, dyes, or preservatives -pregnant or trying to get pregnant -breast-feeding How should I use this medicine? Take this medicine by mouth with a glass of water. Follow the directions on the prescription label. You can take it with or without food. If it upsets your stomach, take it with food. Take your medicine at regular intervals. Do not take it more often than directed. Do not stop taking except on your doctor's advice. Stopping this medicine may increase your risk of a blot clot. Be sure to refill your prescription before you run out of medicine. Talk to your pediatrician regarding the use of this medicine in children. Special care may be needed. Overdosage: If you think you have taken too much of this medicine contact a poison control center or emergency room at once. NOTE: This medicine is only for you. Do not share this medicine with others. What if I miss a dose? If you miss a dose, take it as soon as you can. If it is almost time for your next dose, take only that dose. Do not take double or extra doses. What  may interact with this medicine? This medicine may interact with the following: -aspirin and aspirin-like medicines -certain medicines for fungal infections like ketoconazole and itraconazole -certain medicines for seizures like carbamazepine and phenytoin -certain medicines that treat or prevent blood clots like warfarin, enoxaparin, and dalteparin -clarithromycin -NSAIDs, medicines for pain and inflammation, like ibuprofen or  naproxen -rifampin -ritonavir -St. John's wort This list may not describe all possible interactions. Give your health care provider a list of all the medicines, herbs, non-prescription drugs, or dietary supplements you use. Also tell them if you smoke, drink alcohol, or use illegal drugs. Some items may interact with your medicine. What should I watch for while using this medicine? Notify your doctor or health care professional and seek emergency treatment if you develop breathing problems; changes in vision; chest pain; severe, sudden headache; pain, swelling, warmth in the leg; trouble speaking; sudden numbness or weakness of the face, arm, or leg. These can be signs that your condition has gotten worse. If you are going to have surgery, tell your doctor or health care professional that you are taking this medicine. Tell your health care professional that you use this medicine before you have a spinal or epidural procedure. Sometimes people who take this medicine have bleeding problems around the spine when they have a spinal or epidural procedure. This bleeding is very rare. If you have a spinal or epidural procedure while on this medicine, call your health care professional immediately if you have back pain, numbness or tingling (especially in your legs and feet), muscle weakness, paralysis, or loss of bladder or bowel control. Avoid sports and activities that might cause injury while you are using this medicine. Severe falls or injuries can cause unseen bleeding. Be careful when using sharp tools or knives. Consider using an Neurosurgeonelectric razor. Take special care brushing or flossing your teeth. Report any injuries, bruising, or red spots on the skin to your doctor or health care professional. What side effects may I notice from receiving this medicine? Side effects that you should report to your doctor or health care professional as soon as possible: -allergic reactions like skin rash, itching or hives,  swelling of the face, lips, or tongue -signs and symptoms of bleeding such as bloody or black, tarry stools; red or dark-brown urine; spitting up blood or brown material that looks like coffee grounds; red spots on the skin; unusual bruising or bleeding from the eye, gums, or nose This list may not describe all possible side effects. Call your doctor for medical advice about side effects. You may report side effects to FDA at 1-800-FDA-1088. Where should I keep my medicine? Keep out of the reach of children. Store at room temperature between 20 and 25 degrees C (68 and 77 degrees F). Throw away any unused medicine after the expiration date. NOTE: This sheet is a summary. It may not cover all possible information. If you have questions about this medicine, talk to your doctor, pharmacist, or health care provider.    2016, [employer] Standard. (2012-11-24 11:59:24)

## 2016-02-07 NOTE — Progress Notes (Signed)
Pt and family given discharge instructions, prescriptions, and care notes. Pt verbalized understanding AEB no further questions or concerns at this time. IV was discontinued, no redness, pain, or swelling noted at this time. Telemetry discontinued and Centralized Telemetry was notified. Pt left the floor via wheelchair with staff in stable condition. 

## 2016-02-07 NOTE — Progress Notes (Addendum)
15:00 CM notes pt new to Eliquis and has delivered the free 30 day trial card to room where pt and family verbalized understanding this card will pay for today's prescription and give insurance   CM received call from RN to make sure HH is arranged.  Rn, Maralyn SagoSarah states pt is NOT on O2 and no order for home O2.  This CM notes previous CM [facility]  This CM notified [facility] DME rep, Reggie to please deliver the shower stool and wheelchair to room so pt can discharge. No other CM needs were communicated.

## 2016-02-07 NOTE — Progress Notes (Signed)
Pt transported to MRI at 0330, returned to floor at 0430.

## 2016-02-07 NOTE — Discharge Summary (Signed)
Physician Discharge Summary  Tracy [name] ZOX:[id number] DOB: Mar 27, 1927 DOA: 02/04/2016  PCP: Oliver Barre, MD  Admit date: 02/04/2016 Discharge date: 02/11/2016  Admitted From: Home Disposition:  Home  Recommendations for Outpatient Follow-up:  1. Follow up with PCP in 1 week 2. Repeat BMP for follow-up of calcium and renal function 3. Follow-up with Dr. Wynetta Emery for back 4. Follow-up anticoagulation  Home Health: Home health PT  Discharge Condition: Stable CODE STATUS: Full code Brief/Interim Summary:  HPI written by Dr. Lyda Perone on 02/04/2016  HPI: Tracy [name] is a 82 y.o. female with medical history significant of HTN, a.fib.  Patient presents to the ED with c/o 10 day history of generalized weakness.  Couple of days ago patient was sleeping all day and night.  No specific CP or SOB, just has no energy in general.  Nothing makes better or worse, no specific reports of melena or blood in stool.  Took BP at home a couple of days ago and was running 88 systolic.  Recently had fall and resulting RLE pain and hip pain.  X ray negative with ortho, had course of prednisone and started on gabapentin which helped, 100mg  QHs didn't help, 300mg  QHs caused sedation so they backed it down to the 100.  Her CC of generalized weakness and fatigue has been going on even before she was put on gabapentin however.  ED Course: Has anemia, has AKI,    Hospital course:  Weakness Likely secondary to anemia, AKI and hip pain. PT worked with patient and recommended home health PT. Patient felt somewhat better with steroid initiation.  Acute kidney injury Present on admission. Baseline of around 1.1. 1.95 on admission and improved with IVF and oral hydration. 1.56 before discharge.  Anemia Unknown cause. Received 2u PRBC. Normocytic. Fecal occult blood test negative. Iron was low on labs. Recommend outpatient iron supplementation.  Right hip pain Secondary to radiculopathy from severe  lumbar spine pathology, including stenoses, disc bulge and degeneration. Discussed with Dr. Wynetta Emery who recommends systemic steroids, PT and muscle relaxer and will see in clinic. Steroids started during hospital stay and continued on discharge with taper.  Hypertension Continued amlodipine  Hypercalcemia Elevated on presentation. Unknown reason. Resolved with fluids. Vitamin D normal. PTH low, and appropriately so for high calcium.  Atrial fibrillation Present on admission. Patient with persistent atrial fibrillation per history but was taken off of anticoagulation about one year ago secondary to GI bleeding. No GI bleeding since then. CHA2DS2-VASc Score is 6. Eliquis started this admission.   Discharge Diagnoses:  Principal Problem:   Weakness Active Problems:   Essential hypertension   A-fib (HCC)   Anemia   Hypercalcemia   AKI (acute kidney injury) (HCC)    Discharge Instructions     Medication List    STOP taking these medications   aspirin 81 MG tablet   benazepril 40 MG tablet Commonly known as:  LOTENSIN   FLUZONE HIGH-DOSE 0.5 ML Susy Generic drug:  Influenza Vac Split High-Dose   naproxen sodium 220 MG tablet Commonly known as:  ANAPROX     TAKE these medications   amLODipine 5 MG tablet Commonly known as:  NORVASC Take 2 tablets (10 mg total) by mouth daily. What changed:  how much to take   apixaban 2.5 MG Tabs tablet Commonly known as:  ELIQUIS Take 1 tablet (2.5 mg total) by mouth 2 (two) times daily.   atorvastatin 10 MG tablet Commonly known as:  LIPITOR 1/2 tab by  mouth daily What changed:  how much to take  how to take this  when to take this  additional instructions   CALTRATE 600 1500 (600 Ca) MG Tabs tablet Generic drug:  calcium carbonate Take 1 tablet by mouth 2 (two) times daily.   feeding supplement (ENSURE ENLIVE) Liqd Take 237 mLs by mouth 2 (two) times daily between meals.   fenofibrate micronized 134 MG  capsule Commonly known as:  LOFIBRA TAKE 1 CAPSULE (134 MG TOTAL) BY MOUTH DAILY.   gabapentin 100 MG capsule Commonly known as:  NEURONTIN Take 2 capsules (200 mg total) by mouth 3 (three) times daily. What changed:  how much to take  when to take this   HYDROcodone-acetaminophen 5-325 MG tablet Commonly known as:  NORCO/VICODIN Take 1 tablet by mouth every 6 (six) hours as needed for moderate pain.   multivitamin capsule Take 1 capsule by mouth daily. Centrum Silver   oxybutynin 5 MG 24 hr tablet Commonly known as:  DITROPAN-XL TAKE 1 TABLET BY MOUTH EVERY DAY   pentoxifylline 400 MG CR tablet Commonly known as:  TRENTAL TAKE 1 TABLET (400 MG TOTAL) BY MOUTH DAILY.   predniSONE 20 MG tablet Commonly known as:  DELTASONE Take 40mg  (2 tablets) x 3 days, 20mg  (1 tablet) x 3 days, 10mg  (1/2 tablet) x 4 days   PRESERVISION AREDS 2 Caps Take 1 capsule by mouth 2 (two) times daily.      Follow-up Information    Oliver Barre, MD. Schedule an appointment as soon as possible for a visit in 1 week(s).   Specialties:  Internal Medicine, Radiology Why:  Recheck labs on 02/09/2016 Contact information: [address] [place] [address] [phone]        CRAM,GARY P, MD. Schedule an appointment as soon as possible for a visit in 1 week(s).   Specialty:  Neurosurgery Why:  For follow-up on back and right leg pain Contact information: [address] [place] [address] [phone]        [facility] .   Why:  home health services Contact information: [address] [place] [address] [phone]          No Known Allergies  Consultations:  Physical therapy   Procedures/Studies: Dg Chest 2 View  Result Date: 02/04/2016 CLINICAL DATA:  Right leg pain with left foot swelling. Irregular heart rate EXAM: CHEST  2 VIEW COMPARISON:  04/28/2010 FINDINGS: The lungs are hyperinflated. Tiny bilateral pleural  effusions or thickening. Mild cardiomegaly without overt failure. Atherosclerosis of the aorta. No pneumothorax. Multiple calcific opacities bilaterally, grossly unchanged. There is a small hiatal hernia. A sclerotic lesion the proximal right humerus is also unchanged. IMPRESSION: 1. Hyperinflation with tiny bilateral CP angle effusion or thickening 2. Stable mild cardiomegaly without overt failure. Atherosclerosis of the aorta. 3. Stable calcified pleural plaques. 4. Stable sclerotic lesion in the proximal right humerus. 5. Small hiatal hernia Electronically Signed   By: Jasmine Pang M.D.   On: 02/04/2016 18:14   Mr Brain Wo Contrast  Result Date: 02/07/2016 CLINICAL DATA:  Dementia and dysarthria EXAM: MRI HEAD WITHOUT CONTRAST TECHNIQUE: Multiplanar, multiecho pulse sequences of the brain and surrounding structures were obtained without intravenous contrast. COMPARISON:  Head CT 03/07/2013 FINDINGS: Brain: No acute infarct or intraparenchymal hemorrhage. The midline structures are normal. There are old left cerebellar infarcts and bilateral basal ganglia lacunar infarcts. There is diffuse confluent hyperintense T2-weighted signal within the periventricular and  deep white matter, most often seen in the setting of chronic microvascular ischemia. No mass lesion or midline shift. There is moderate atrophy. Bilateral occipital encephalomalacia. No hydrocephalus or extra-axial fluid collection. Vascular: Major intracranial arterial and venous sinus flow voids are preserved. No evidence of chronic microhemorrhage or amyloid angiopathy. Skull and upper cervical spine: The visualized skull base, calvarium, upper cervical spine and extracranial soft tissues are normal. Sinuses/Orbits: No fluid levels or advanced mucosal thickening. No mastoid effusion. Normal orbits. IMPRESSION: 1. No acute abnormality. 2. 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 3.  Advanced findings of chronic microvascular ischemia and moderate atrophy. Electronically Signed   By: Deatra RobinsonKevin  Herman M.D.   On: 02/07/2016 05:26   Mr Lumbar Spine Wo Contrast  Result Date: 02/05/2016 CLINICAL DATA:  Ten days of general weakness, somnolent. Recent fall with RIGHT hip pain. EXAM: MRI LUMBAR SPINE WITHOUT CONTRAST TECHNIQUE: Multiplanar, multisequence MR imaging of the lumbar spine was performed. No intravenous contrast was administered. GFR 26. As patient looped in between sequences, at the sagittal STIR does not correspond to the sagittal T1 and T2 can the axial sequences in not car register to the sagittal sequences. COMPARISON:  None. FINDINGS: SEGMENTATION: For the purposes of this report, the last well-formed intervertebral disc will be described as L5-S1. ALIGNMENT: Maintenance of the lumbar lordosis. Minimal grade 1 L2-3, L3-4, L4-5 and L5-S1 anterolisthesis. No definite spondylolysis. VERTEBRAE:Vertebral bodies are intact. Severe L5-S1 disc height loss with severe acute on chronic discogenic endplate changes. The mild L2-3 disc height loss. Disc desiccation all lumbar levels. T1 and T2 bright bone marrow signal compatible with osteopenia. No suspicious bone marrow signal. CONUS MEDULLARIS: Conus medullaris terminates at L1-2 and demonstrates normal morphology and signal characteristics. Cauda equina is normal. PARASPINAL AND SOFT TISSUES: Included prevertebral and paraspinal soft tissues are nonacute. Severe sigmoid diverticulosis. Moderate paraspinal muscle atrophy. DISC LEVELS (patient moved P4 the axial sequences which do not well coregister to the sagittal sequences): L1-2: No disc bulge, canal stenosis nor neural foraminal narrowing. Mild facet arthropathy and ligamentum flavum redundancy. L2-3: Anterolisthesis. Moderate broad-based disc bulge with small LEFT central suspected disc extrusion, difficult to quantify due to slice selection. Mild facet arthropathy and ligamentum flavum  redundancy with trace facet effusions which are likely reactive. Moderate canal stenosis. Mild RIGHT, moderate to severe LEFT neural foraminal narrowing. L3-4: Anterolisthesis. RIGHT subarticular and/or extra foramina disc extrusion difficult to quantify due to patient motion with 11 mm of contiguous superior migration. RIGHT extra foraminal abnormal soft tissue, with T2 bright signal within the exited RIGHT L3 nerve. Moderate broad-based disc bulge. Mild facet arthropathy and ligamentum flavum redundancy with trace facet effusions which are likely reactive. Moderate canal stenosis. Moderate to severe RIGHT neural foraminal narrowing. L4-5: Anterolisthesis. Large broad-based disc bulge. Moderate facet arthropathy and ligamentum flavum redundancy with trace facet effusions which are likely reactive. Severe canal stenosis. Moderate RIGHT neural foraminal narrowing. L5-S1: Anterolisthesis. Large broad-based disc bulge, mild facet arthropathy and ligamentum flavum redundancy. Mild canal stenosis including partially effaced RIGHT lateral recess which may affect the traversing RIGHT S1 nerve. Moderate to severe RIGHT, severe LEFT neural foraminal narrowing. IMPRESSION: Limited assessment due to patient moving between sequences. No acute fracture. Grade 1 minimal L2-3 through L5-S1 anterolisthesis on degenerative basis. RIGHT L3-4 subarticular and/or extra foraminal disc extrusion RIGHT L3 neuritis, moderate to severe RIGHT L3-4 neural foraminal narrowing. Severe canal stenosis L4-5.  Moderate canal stenosis L2-3 and L3-4. Neural foraminal narrowing  L2-3 through L5-S1: Severe on the LEFT at L5-S1. Electronically Signed   By: Awilda Metroourtnay  Bloomer M.D.   On: 02/05/2016 22:33   Ct Hip Right Wo Contrast  Result Date: 02/04/2016 CLINICAL DATA:  Fall with right posterior hip pain EXAM: CT OF THE RIGHT HIP WITHOUT CONTRAST TECHNIQUE: Multidetector CT imaging of the right hip was performed according to the standard protocol.  Multiplanar CT image reconstructions were also generated. COMPARISON:  None. FINDINGS: Bones/Joint/Cartilage There is motion artifact which limits the exam. There is no definite acute displaced fracture of the right pelvis. The right femoral head appears normally positioned. The pubic rami appear intact. The pubic symphysis is without fracture or diode stasis. There are scattered nonspecific sclerotic foci within the right pelvic tones. Ligaments Suboptimally assessed by CT. Muscles and Tendons Mild fatty atrophy of the right hip musculature. Soft tissues Partially visualized right iliac stent. Densely calcified right iliac vessels. Moderate subcutaneous soft tissue swelling, partially visualized within the upper posterior thigh. IMPRESSION: 1. Motion artifact limits the exam 2. There is no gross fracture or malalignment 3. Partially visualized subcutaneous soft tissue swelling right posterior upper thigh and gluteal region. Electronically Signed   By: Jasmine PangKim  Fujinaga M.D.   On: 02/04/2016 21:18      Subjective: Patient reports improvement in pain. No concerns today.  Discharge Exam: Vitals:   02/06/16 2040 02/07/16 0457  BP: (!) 162/89 (!) 159/90  Pulse: (!) 101 95  Resp: 19 20  Temp: 98.2 F (36.8 C) 98.4 F (36.9 C)   Vitals:   02/06/16 1606 02/06/16 2040 02/07/16 0457 02/07/16 0622  BP: (!) 161/99 (!) 162/89 (!) 159/90   Pulse: 93 (!) 101 95   Resp: 16 19 20    Temp: 98.1 F (36.7 C) 98.2 F (36.8 C) 98.4 F (36.9 C)   TempSrc: Oral     SpO2: 95% 97% 100% 97%  Weight:      Height:        General exam: Appears calm and comfortable Respiratory system: Clear to auscultation. Respiratory effort normal. Cardiovascular system: S1 & S2 heard, RRR. No murmurs, rubs, gallops or clicks. Gastrointestinal system: Abdomen is nondistended, soft and nontender. Normal bowel sounds heard. Central nervous system: Alert and oriented. Some slurred speech. 4/5 strength throughout Extremities: No  edema. No calf tenderness. Some tenderness over right ischium Skin: No cyanosis. No rashes Psychiatry: Judgement and insight appear normal. Mood & affect appropriate.   The results of significant diagnostics from this hospitalization (including imaging, microbiology, ancillary and laboratory) are listed below for reference.     Microbiology: No results found for this or any previous visit (from the past 240 hour(s)).   Labs: BNP (last 3 results) No results for input(s): BNP in the last 8760 hours. Basic Metabolic Panel:  Recent Labs Lab 02/04/16 1718 02/04/16 2143 02/05/16 0944 02/06/16 0258 02/06/16 1358 02/07/16 0524  NA 141  --  143 141 141 140  K 4.7  --  4.6 4.6 4.6 4.5  CL 103  --  109 106 107 106  CO2 31  --  24 27 25 25   GLUCOSE 139*  --  137* 84 84 114*  BUN 34*  --  34* 36* 33* 32*  CREATININE 1.95*  --  1.90* 1.91* 1.73* 1.56*  CALCIUM 11.5* 10.9* 11.1* 10.4* 10.0 9.8   Liver Function Tests:  Recent Labs Lab 02/06/16 1539  ALBUMIN 2.5*   No results for input(s): LIPASE, AMYLASE in the last 168 hours. No results  for input(s): AMMONIA in the last 168 hours. CBC:  Recent Labs Lab 02/04/16 1718 02/05/16 0944 02/06/16 0258 02/07/16 0524  WBC 10.6* 16.9* 11.0* 9.1  HGB 7.6* 11.3* 10.1* 10.1*  HCT 24.2* 34.9* 31.4* 32.0*  MCV 92.4 89.3 88.2 89.4  PLT 371 397 339 352   Cardiac Enzymes:  Recent Labs Lab 02/04/16 2143  CKTOTAL 30*   BNP: Invalid input(s): POCBNP CBG: No results for input(s): GLUCAP in the last 168 hours. D-Dimer No results for input(s): DDIMER in the last 72 hours. Hgb A1c No results for input(s): HGBA1C in the last 72 hours. Lipid Profile No results for input(s): CHOL, HDL, LDLCALC, TRIG, CHOLHDL, LDLDIRECT in the last 72 hours. Thyroid function studies No results for input(s): TSH, T4TOTAL, T3FREE, THYROIDAB in the last 72 hours.  Invalid input(s): FREET3 Anemia work up No results for input(s): VITAMINB12, FOLATE,  FERRITIN, TIBC, IRON, RETICCTPCT in the last 72 hours. Urinalysis    Component Value Date/Time   COLORURINE YELLOW 05/27/2015 1351   APPEARANCEUR CLEAR 05/27/2015 1351   LABSPEC 1.020 05/27/2015 1351   PHURINE 6.5 05/27/2015 1351   GLUCOSEU NEGATIVE 05/27/2015 1351   HGBUR NEGATIVE 05/27/2015 1351   BILIRUBINUR NEGATIVE 05/27/2015 1351   KETONESUR NEGATIVE 05/27/2015 1351   PROTEINUR NEGATIVE 07/01/2014 1510   UROBILINOGEN 0.2 05/27/2015 1351   NITRITE NEGATIVE 05/27/2015 1351   LEUKOCYTESUR NEGATIVE 05/27/2015 1351   Sepsis Labs Invalid input(s): PROCALCITONIN,  WBC,  LACTICIDVEN Microbiology No results found for this or any previous visit (from the past 240 hour(s)).   Time coordinating discharge: Over 30 minutes  SIGNED:   Jacquelin Hawking, MD [facility] 02/11/2016, 12:29 PM Pager [phone]  If 7PM-7AM, please contact night-coverage [email or web address] Password [email or web address]

## 2016-02-09 ENCOUNTER — Telehealth: Payer: Self-pay | Admitting: Internal Medicine

## 2016-02-10 DIAGNOSIS — D649 Anemia, unspecified: Secondary | ICD-10-CM | POA: Diagnosis not present

## 2016-02-10 DIAGNOSIS — I4891 Unspecified atrial fibrillation: Secondary | ICD-10-CM | POA: Diagnosis not present

## 2016-02-10 DIAGNOSIS — I10 Essential (primary) hypertension: Secondary | ICD-10-CM | POA: Diagnosis not present

## 2016-02-10 DIAGNOSIS — R2689 Other abnormalities of gait and mobility: Secondary | ICD-10-CM | POA: Diagnosis not present

## 2016-02-11 ENCOUNTER — Telehealth: Payer: Self-pay

## 2016-02-11 DIAGNOSIS — I10 Essential (primary) hypertension: Secondary | ICD-10-CM | POA: Diagnosis not present

## 2016-02-11 DIAGNOSIS — I4891 Unspecified atrial fibrillation: Secondary | ICD-10-CM | POA: Diagnosis not present

## 2016-02-11 DIAGNOSIS — D649 Anemia, unspecified: Secondary | ICD-10-CM | POA: Diagnosis not present

## 2016-02-11 DIAGNOSIS — R2689 Other abnormalities of gait and mobility: Secondary | ICD-10-CM | POA: Diagnosis not present

## 2016-02-11 NOTE — Telephone Encounter (Signed)
Please advise, Tracy [name] a physical therapist called about patient. She is requesting a verbal for patient to have PT 3 times a week for one week and 2 times a week for two weeks. Also wanted to give an FYI that patient has stopped taking the eliquis along with the aspirin.

## 2016-02-11 NOTE — Telephone Encounter (Signed)
Ok for verbal  All I can do is recommend pt take all meds as prescribed

## 2016-02-11 NOTE — Telephone Encounter (Signed)
Left msg on triage stating wanting to let MD know pt has appt tomorrow, and her HR has been running in the low 100's. She has stop taking the Eliquis & Asprin...Raechel Chute/lmb

## 2016-02-11 NOTE — Telephone Encounter (Signed)
Verbals given  

## 2016-02-12 ENCOUNTER — Other Ambulatory Visit (INDEPENDENT_AMBULATORY_CARE_PROVIDER_SITE_OTHER): Payer: Medicare Other

## 2016-02-12 ENCOUNTER — Encounter: Payer: Self-pay | Admitting: Internal Medicine

## 2016-02-12 ENCOUNTER — Other Ambulatory Visit: Payer: Self-pay | Admitting: Internal Medicine

## 2016-02-12 ENCOUNTER — Ambulatory Visit (INDEPENDENT_AMBULATORY_CARE_PROVIDER_SITE_OTHER): Payer: Medicare Other | Admitting: Internal Medicine

## 2016-02-12 VITALS — BP 122/76 | HR 100 | Temp 98.7°F | Resp 20 | Wt 112.0 lb

## 2016-02-12 DIAGNOSIS — D509 Iron deficiency anemia, unspecified: Secondary | ICD-10-CM

## 2016-02-12 DIAGNOSIS — N179 Acute kidney failure, unspecified: Secondary | ICD-10-CM | POA: Diagnosis not present

## 2016-02-12 DIAGNOSIS — D72829 Elevated white blood cell count, unspecified: Secondary | ICD-10-CM

## 2016-02-12 DIAGNOSIS — R2689 Other abnormalities of gait and mobility: Secondary | ICD-10-CM | POA: Diagnosis not present

## 2016-02-12 DIAGNOSIS — S9030XA Contusion of unspecified foot, initial encounter: Secondary | ICD-10-CM | POA: Insufficient documentation

## 2016-02-12 DIAGNOSIS — R042 Hemoptysis: Secondary | ICD-10-CM | POA: Diagnosis not present

## 2016-02-12 DIAGNOSIS — M5416 Radiculopathy, lumbar region: Secondary | ICD-10-CM | POA: Diagnosis not present

## 2016-02-12 DIAGNOSIS — S9032XA Contusion of left foot, initial encounter: Secondary | ICD-10-CM

## 2016-02-12 DIAGNOSIS — I4891 Unspecified atrial fibrillation: Secondary | ICD-10-CM | POA: Diagnosis not present

## 2016-02-12 DIAGNOSIS — I4819 Other persistent atrial fibrillation: Secondary | ICD-10-CM

## 2016-02-12 DIAGNOSIS — I481 Persistent atrial fibrillation: Secondary | ICD-10-CM

## 2016-02-12 DIAGNOSIS — I10 Essential (primary) hypertension: Secondary | ICD-10-CM | POA: Diagnosis not present

## 2016-02-12 DIAGNOSIS — D649 Anemia, unspecified: Secondary | ICD-10-CM | POA: Diagnosis not present

## 2016-02-12 LAB — CBC WITH DIFFERENTIAL/PLATELET
BASOS PCT: 0.3 % (ref 0.0–3.0)
Basophils Absolute: 0.1 10*3/uL (ref 0.0–0.1)
EOS ABS: 0.2 10*3/uL (ref 0.0–0.7)
EOS PCT: 1.1 % (ref 0.0–5.0)
HEMATOCRIT: 31 % — AB (ref 36.0–46.0)
HEMOGLOBIN: 10.1 g/dL — AB (ref 12.0–15.0)
LYMPHS PCT: 3.8 % — AB (ref 12.0–46.0)
Lymphs Abs: 0.7 10*3/uL (ref 0.7–4.0)
MCHC: 32.4 g/dL (ref 30.0–36.0)
MCV: 87.5 fl (ref 78.0–100.0)
MONO ABS: 0.7 10*3/uL (ref 0.1–1.0)
Monocytes Relative: 4.2 % (ref 3.0–12.0)
Neutro Abs: 15.8 10*3/uL — ABNORMAL HIGH (ref 1.4–7.7)
Neutrophils Relative %: 90.6 % — ABNORMAL HIGH (ref 43.0–77.0)
Platelets: 443 10*3/uL — ABNORMAL HIGH (ref 150.0–400.0)
RBC: 3.55 Mil/uL — AB (ref 3.87–5.11)
RDW: 17 % — AB (ref 11.5–15.5)
WBC: 17.4 10*3/uL — AB (ref 4.0–10.5)

## 2016-02-12 LAB — BASIC METABOLIC PANEL
BUN: 34 mg/dL — ABNORMAL HIGH (ref 6–23)
CHLORIDE: 101 meq/L (ref 96–112)
CO2: 34 mEq/L — ABNORMAL HIGH (ref 19–32)
CREATININE: 0.99 mg/dL (ref 0.40–1.20)
Calcium: 10 mg/dL (ref 8.4–10.5)
GFR: 68.26 mL/min (ref >60.00)
Glucose, Bld: 91 mg/dL (ref 70–99)
POTASSIUM: 4.1 meq/L (ref 3.5–5.1)
Sodium: 139 mEq/L (ref 135–145)

## 2016-02-12 MED ORDER — HYDROCODONE-ACETAMINOPHEN 5-325 MG PO TABS: 1.0000 | ORAL_TABLET | Freq: Four times a day (QID) | ORAL | 0 refills | Status: DC | PRN

## 2016-02-12 NOTE — Assessment & Plan Note (Signed)
Mild, improved with IVF, for f/u renal fxn today

## 2016-02-12 NOTE — Assessment & Plan Note (Addendum)
Mild to mod single episode, none further per son since stopped the eliquis,  for cbc,  Refer pulm, consider ct chest, to f/u any worsening symptoms or concerns  Note:  Total time for pt hx, exam, review of record with pt in the room, determination of diagnoses and plan for further eval and tx is > 40 min, with over 50% spent in coordination and counseling of patient

## 2016-02-12 NOTE — Progress Notes (Signed)
Pre visit review using our clinic review tool, if applicable. No additional management support is needed unless otherwise documented below in the visit note. 

## 2016-02-12 NOTE — Progress Notes (Signed)
Subjective:    Patient ID: Tracy [name], female    DOB: Dec 10, 1926, 82 y.o.   MRN: [id number]  HPI   Here post hospn with new eliquis with hx of afib controlled, after 2 u PRBC for anemia c/w iron deficiency, but no overt GI blood loss; has hx of GI bleed with prior anticoag stopped 2016 and not restarted.  Eliquis costs her $478/mo and cannot afford.  Had free 30 days from hospital at d/c but unfortunately had episode of cough with handful sized BRB on sheet when woke Sun Nov 5, found by son who lives with her.  He stopped the eliquis, no further bleeding noted, still has slight cough residual but minimal and not important to son today. Also noticed same morning bilat pedal edema left > right with marked bruising to dorsum left foot ? Spontaneous as did not have trauma or turned ankle she is aware, just woke up with it, no foot pain.  No other overt bleeding.  Did have cxr nov 1 - NAD, chronic issues only.  Still with right hip radicular pain, and son has not been able to make appt with Dr Wynetta Emeryram, has left message nov 6 but no call back. Out of hydrocodone.  No falls, Still working with Home PT, just started already post hospn.  Also OT to come today.  Denies urinary symptoms such as dysuria, frequency, urgency, flank pain, hematuria or n/v, fever, chills. Past Medical History:  Diagnosis Date  . Atrial fibrillation (HCC)   . CAROTID STENOSIS   . CVA (cerebrovascular accident) (HCC) 2002  . DIABETES MELLITUS, TYPE II   . Diverticulosis of colon   . DIVERTICULOSIS, COLON   . DM (diabetes mellitus), type 2 (HCC)   . DVT (deep venous thrombosis) (HCC)   . GERD   . GERD (gastroesophageal reflux disease)   . HTN (hypertension)   . Hx of adenomatous colonic polyps 2007  . Hyperlipidemia   . HYPERLIPIDEMIA   . Hypertension   . Osteopenia   . OSTEOPENIA   . PVD (peripheral vascular disease) (HCC)    recently dx w/ carotid -left  int carotid >90% 50-60% rt int carotid 02/2010  . PVD  (peripheral vascular disease) (HCC)    lf  ABI 0.11 and rt 0.43  . Sinus node dysfunction (HCC) 2011  . Stroke [facility](HCC) July 01, 2014   Affected Left side   Past Surgical History:  Procedure Laterality Date  . CAROTID ENDARTERECTOMY    . cataract eye surgery    . CNS vascular stent per Neurosurgeon    . EYE SURGERY    . ILIAC ARTERY STENT  2011   Bilateral CIA stent  . lazer eye surgery    . Right Shoulder Surgery  2010  . TUBAL LIGATION      reports that she has never smoked. She has never used smokeless tobacco. She reports that she does not drink alcohol or use drugs. family history includes Cancer in her mother; Diabetes in her sister. No Known Allergies Current Outpatient Prescriptions on File Prior to Visit  Medication Sig Dispense Refill  . amLODipine (NORVASC) 5 MG tablet Take 2 tablets (10 mg total) by mouth daily. 60 tablet 0  . atorvastatin (LIPITOR) 10 MG tablet 1/2 tab by mouth daily (Patient taking differently: Take 5 mg by mouth daily at 6 PM. 1/2 tab by mouth daily) 45 tablet 3  . Calcium Carbonate (CALTRATE 600) 1500 MG TABS Take 1 tablet by mouth 2 (  two) times daily.     . feeding supplement, ENSURE ENLIVE, (ENSURE ENLIVE) LIQD Take 237 mLs by mouth 2 (two) times daily between meals. 30 Bottle 1  . fenofibrate micronized (LOFIBRA) 134 MG capsule TAKE 1 CAPSULE (134 MG TOTAL) BY MOUTH DAILY. 90 capsule 3  . gabapentin (NEURONTIN) 100 MG capsule Take 2 capsules (200 mg total) by mouth 3 (three) times daily. 180 capsule 0  . Multiple Vitamin (MULTIVITAMIN) capsule Take 1 capsule by mouth daily. Centrum Silver    . Multiple Vitamins-Minerals (PRESERVISION AREDS 2) CAPS Take 1 capsule by mouth 2 (two) times daily.    Marland Kitchen. oxybutynin (DITROPAN-XL) 5 MG 24 hr tablet TAKE 1 TABLET BY MOUTH EVERY DAY 90 tablet 3  . pentoxifylline (TRENTAL) 400 MG CR tablet TAKE 1 TABLET (400 MG TOTAL) BY MOUTH DAILY. 90 tablet 3  . predniSONE (DELTASONE) 20 MG tablet Take 40mg  (2 tablets) x 3  days, 20mg  (1 tablet) x 3 days, 10mg  (1/2 tablet) x 4 days 11 tablet 0   No current facility-administered medications on file prior to visit.    Review of Systems  Constitutional: Negative for unusual diaphoresis or night sweats HENT: Negative for ear swelling or discharge Eyes: Negative for worsening visual haziness  Respiratory: Negative for choking and stridor.   Gastrointestinal: Negative for distension or worsening eructation Genitourinary: Negative for retention or change in urine volume.  Musculoskeletal: Negative for other MSK pain or swelling Skin: Negative for color change and worsening wound Neurological: Negative for tremors and numbness other than noted  Psychiatric/Behavioral: Negative for decreased concentration or agitation other than above   All other system neg per pt    Objective:   Physical Exam BP 122/76   Pulse 100   Temp 98.7 F (37.1 C) (Oral)   Resp 20   Wt 112 lb (50.8 kg)   SpO2 93%   BMI 22.62 kg/m  VS noted, non toxic, fatigued  Constitutional: Pt appears in no apparent distress HENT: Head: NCAT.  Right Ear: External ear normal.  Left Ear: External ear normal.  Eyes: . Pupils are equal, round, and reactive to light. Conjunctivae and EOM are normal Neck: Normal range of motion. Neck supple.  Cardiovascular: Normal rate and irregular rhythm.   Pulmonary/Chest: Effort normal and breath sounds without rales or wheezing.  Abd:  Soft, NT, ND, + BS, no flank tender Neurological: Pt is alert. Not confused , motor grossly intact Skin: Skin is warm. No rash, trace to 1+ bilat pedal LE edema, large  Bruised area to entire left foot dorsum.  Psychiatric: Pt behavior is normal. No agitation.  No other new exam findings   Assessment & Plan:

## 2016-02-12 NOTE — Assessment & Plan Note (Signed)
stable overall by history and exam, rate and volume ok, and pt to change the eliquis to asa 81 qd given the bleeding issues, to which the son agrees,  to f/u any worsening symptoms or concerns

## 2016-02-12 NOTE — Assessment & Plan Note (Signed)
Mild, likely a hydration issue, for f/u today with labs

## 2016-02-12 NOTE — Assessment & Plan Note (Signed)
Stable, for hydrocodone prn refill, refer Dr Wynetta Emeryram

## 2016-02-12 NOTE — Assessment & Plan Note (Signed)
Atraumatic per pt and son, no other spontaneous bruising noted, causes more acute on chronic pedal edema which should improve with healing, no specific tx needed

## 2016-02-12 NOTE — Assessment & Plan Note (Signed)
Has left foot brusiing and report of probable hemoptysis, but not likely enough to account for iron deficiency noted on labs Nov 1; will refer GI, f/u cbc today

## 2016-02-12 NOTE — Patient Instructions (Addendum)
OK to stop the Eliquis  Please restart the Aspirin 81 mg per day  Please also start Iron Sulfate 325 mg  - 1 per day (OTC)  Please continue all other medications as before, and refills have been done if requested - the hydrocodone  Please have the pharmacy call with any other refills you may need.  Please keep your appointments with your specialists as you may have planned  You will be contacted regarding the referral for: Pulmonary, Gastroenterology, as well as Dr Wynetta Emeryram  Please go to the LAB in the Basement (turn left off the elevator) for the tests to be done today  You will be contacted by phone if any changes need to be made immediately.  Otherwise, you will receive a letter about your results with an explanation, but please check with MyChart first.  Please remember to sign up for MyChart if you have not done so, as this will be important to you in the future with finding out test results, communicating by private email, and scheduling acute appointments online when needed.  If you have Medicare related insurance (such as traditoinal Medicare, [employer] Medicare or [employer] Medicare, or similar), Please make an appointment at the Scheduling desk with Selena BattenKim, the [facility] [employer], for your Wellness Visit in this office, which is a benefit with your insurance.  Please return in 3 months, or sooner if needed

## 2016-02-13 ENCOUNTER — Encounter: Payer: Self-pay | Admitting: Internal Medicine

## 2016-02-13 ENCOUNTER — Other Ambulatory Visit (INDEPENDENT_AMBULATORY_CARE_PROVIDER_SITE_OTHER): Payer: Medicare Other

## 2016-02-13 ENCOUNTER — Telehealth: Payer: Self-pay | Admitting: Internal Medicine

## 2016-02-13 DIAGNOSIS — I4891 Unspecified atrial fibrillation: Secondary | ICD-10-CM | POA: Diagnosis not present

## 2016-02-13 DIAGNOSIS — N390 Urinary tract infection, site not specified: Secondary | ICD-10-CM | POA: Diagnosis not present

## 2016-02-13 DIAGNOSIS — D649 Anemia, unspecified: Secondary | ICD-10-CM | POA: Diagnosis not present

## 2016-02-13 DIAGNOSIS — D72829 Elevated white blood cell count, unspecified: Secondary | ICD-10-CM | POA: Diagnosis not present

## 2016-02-13 DIAGNOSIS — I10 Essential (primary) hypertension: Secondary | ICD-10-CM | POA: Diagnosis not present

## 2016-02-13 DIAGNOSIS — R2689 Other abnormalities of gait and mobility: Secondary | ICD-10-CM | POA: Diagnosis not present

## 2016-02-13 LAB — URINALYSIS, ROUTINE W REFLEX MICROSCOPIC
BILIRUBIN URINE: NEGATIVE
HGB URINE DIPSTICK: NEGATIVE
Ketones, ur: NEGATIVE
Leukocytes, UA: NEGATIVE
Nitrite: NEGATIVE
RBC / HPF: NONE SEEN (ref 0–?)
Specific Gravity, Urine: 1.02 (ref 1.000–1.030)
Total Protein, Urine: NEGATIVE
Urine Glucose: NEGATIVE
Urobilinogen, UA: 0.2 (ref 0.0–1.0)
pH: 8 (ref 5.0–8.0)

## 2016-02-13 NOTE — Telephone Encounter (Signed)
Patient  fell last night going to the bathroom.  Did not hurt herself.  There is no bruising or swelling.  She does have more thigh pain since fall.

## 2016-02-13 NOTE — Telephone Encounter (Signed)
Called patient son, left message advising patient to go to ER

## 2016-02-13 NOTE — Telephone Encounter (Signed)
I would recommend ER evaluation for any persistent or worsening pain, or further falls or balance issues

## 2016-02-15 LAB — URINE CULTURE: ORGANISM ID, BACTERIA: NO GROWTH

## 2016-02-17 DIAGNOSIS — I10 Essential (primary) hypertension: Secondary | ICD-10-CM | POA: Diagnosis not present

## 2016-02-17 DIAGNOSIS — D649 Anemia, unspecified: Secondary | ICD-10-CM | POA: Diagnosis not present

## 2016-02-17 DIAGNOSIS — R2689 Other abnormalities of gait and mobility: Secondary | ICD-10-CM | POA: Diagnosis not present

## 2016-02-17 DIAGNOSIS — I4891 Unspecified atrial fibrillation: Secondary | ICD-10-CM | POA: Diagnosis not present

## 2016-02-18 DIAGNOSIS — M5416 Radiculopathy, lumbar region: Secondary | ICD-10-CM | POA: Diagnosis not present

## 2016-02-18 DIAGNOSIS — M5136 Other intervertebral disc degeneration, lumbar region: Secondary | ICD-10-CM | POA: Diagnosis not present

## 2016-02-19 ENCOUNTER — Telehealth: Payer: Self-pay

## 2016-02-19 DIAGNOSIS — I10 Essential (primary) hypertension: Secondary | ICD-10-CM | POA: Diagnosis not present

## 2016-02-19 DIAGNOSIS — D649 Anemia, unspecified: Secondary | ICD-10-CM | POA: Diagnosis not present

## 2016-02-19 DIAGNOSIS — I4891 Unspecified atrial fibrillation: Secondary | ICD-10-CM | POA: Diagnosis not present

## 2016-02-19 DIAGNOSIS — R2689 Other abnormalities of gait and mobility: Secondary | ICD-10-CM | POA: Diagnosis not present

## 2016-02-19 NOTE — Telephone Encounter (Signed)
Home Health Cert/Plan of Care (02/10/2016 - 04/09/2016) received signed. Faxed and copy sent to scan

## 2016-02-20 ENCOUNTER — Encounter: Payer: Self-pay | Admitting: Gastroenterology

## 2016-02-20 DIAGNOSIS — M5416 Radiculopathy, lumbar region: Secondary | ICD-10-CM | POA: Diagnosis not present

## 2016-02-23 ENCOUNTER — Encounter (HOSPITAL_COMMUNITY): Payer: Self-pay | Admitting: Emergency Medicine

## 2016-02-23 ENCOUNTER — Inpatient Hospital Stay (HOSPITAL_COMMUNITY)
Admission: EM | Admit: 2016-02-23 | Discharge: 2016-02-26 | DRG: 378 | Disposition: A | Discharge status: Home Health | Payer: Medicare Other | Attending: Internal Medicine | Admitting: Internal Medicine

## 2016-02-23 ENCOUNTER — Inpatient Hospital Stay (HOSPITAL_COMMUNITY): Payer: Medicare Other

## 2016-02-23 DIAGNOSIS — K319 Disease of stomach and duodenum, unspecified: Secondary | ICD-10-CM | POA: Diagnosis not present

## 2016-02-23 DIAGNOSIS — K920 Hematemesis: Secondary | ICD-10-CM | POA: Diagnosis present

## 2016-02-23 DIAGNOSIS — N183 Chronic kidney disease, stage 3 (moderate): Secondary | ICD-10-CM | POA: Diagnosis not present

## 2016-02-23 DIAGNOSIS — Z86010 Personal history of colonic polyps: Secondary | ICD-10-CM

## 2016-02-23 DIAGNOSIS — E1122 Type 2 diabetes mellitus with diabetic chronic kidney disease: Secondary | ICD-10-CM | POA: Diagnosis not present

## 2016-02-23 DIAGNOSIS — K5730 Diverticulosis of large intestine without perforation or abscess without bleeding: Secondary | ICD-10-CM | POA: Diagnosis present

## 2016-02-23 DIAGNOSIS — Z833 Family history of diabetes mellitus: Secondary | ICD-10-CM

## 2016-02-23 DIAGNOSIS — K921 Melena: Secondary | ICD-10-CM | POA: Diagnosis not present

## 2016-02-23 DIAGNOSIS — I4891 Unspecified atrial fibrillation: Secondary | ICD-10-CM | POA: Diagnosis not present

## 2016-02-23 DIAGNOSIS — K311 Adult hypertrophic pyloric stenosis: Secondary | ICD-10-CM | POA: Diagnosis not present

## 2016-02-23 DIAGNOSIS — Z7902 Long term (current) use of antithrombotics/antiplatelets: Secondary | ICD-10-CM

## 2016-02-23 DIAGNOSIS — I481 Persistent atrial fibrillation: Secondary | ICD-10-CM

## 2016-02-23 DIAGNOSIS — Z79891 Long term (current) use of opiate analgesic: Secondary | ICD-10-CM

## 2016-02-23 DIAGNOSIS — R001 Bradycardia, unspecified: Secondary | ICD-10-CM | POA: Diagnosis present

## 2016-02-23 DIAGNOSIS — K219 Gastro-esophageal reflux disease without esophagitis: Secondary | ICD-10-CM | POA: Diagnosis not present

## 2016-02-23 DIAGNOSIS — M8580 Other specified disorders of bone density and structure, unspecified site: Secondary | ICD-10-CM | POA: Diagnosis present

## 2016-02-23 DIAGNOSIS — M949 Disorder of cartilage, unspecified: Secondary | ICD-10-CM

## 2016-02-23 DIAGNOSIS — R0682 Tachypnea, not elsewhere classified: Secondary | ICD-10-CM

## 2016-02-23 DIAGNOSIS — K449 Diaphragmatic hernia without obstruction or gangrene: Secondary | ICD-10-CM | POA: Diagnosis present

## 2016-02-23 DIAGNOSIS — IMO0002 Reserved for concepts with insufficient information to code with codable children: Secondary | ICD-10-CM | POA: Diagnosis present

## 2016-02-23 DIAGNOSIS — E119 Type 2 diabetes mellitus without complications: Secondary | ICD-10-CM

## 2016-02-23 DIAGNOSIS — Z7982 Long term (current) use of aspirin: Secondary | ICD-10-CM | POA: Diagnosis not present

## 2016-02-23 DIAGNOSIS — M5416 Radiculopathy, lumbar region: Secondary | ICD-10-CM | POA: Diagnosis present

## 2016-02-23 DIAGNOSIS — I10 Essential (primary) hypertension: Secondary | ICD-10-CM | POA: Diagnosis not present

## 2016-02-23 DIAGNOSIS — I5033 Acute on chronic diastolic (congestive) heart failure: Secondary | ICD-10-CM | POA: Diagnosis present

## 2016-02-23 DIAGNOSIS — I130 Hypertensive heart and chronic kidney disease with heart failure and stage 1 through stage 4 chronic kidney disease, or unspecified chronic kidney disease: Secondary | ICD-10-CM | POA: Diagnosis present

## 2016-02-23 DIAGNOSIS — K922 Gastrointestinal hemorrhage, unspecified: Secondary | ICD-10-CM | POA: Diagnosis not present

## 2016-02-23 DIAGNOSIS — Z79899 Other long term (current) drug therapy: Secondary | ICD-10-CM

## 2016-02-23 DIAGNOSIS — Z86718 Personal history of other venous thrombosis and embolism: Secondary | ICD-10-CM

## 2016-02-23 DIAGNOSIS — R2689 Other abnormalities of gait and mobility: Secondary | ICD-10-CM | POA: Diagnosis not present

## 2016-02-23 DIAGNOSIS — I272 Pulmonary hypertension, unspecified: Secondary | ICD-10-CM | POA: Diagnosis present

## 2016-02-23 DIAGNOSIS — D62 Acute posthemorrhagic anemia: Secondary | ICD-10-CM | POA: Diagnosis present

## 2016-02-23 DIAGNOSIS — E118 Type 2 diabetes mellitus with unspecified complications: Secondary | ICD-10-CM

## 2016-02-23 DIAGNOSIS — I517 Cardiomegaly: Secondary | ICD-10-CM | POA: Diagnosis not present

## 2016-02-23 DIAGNOSIS — N179 Acute kidney failure, unspecified: Secondary | ICD-10-CM | POA: Diagnosis present

## 2016-02-23 DIAGNOSIS — E1165 Type 2 diabetes mellitus with hyperglycemia: Secondary | ICD-10-CM | POA: Diagnosis not present

## 2016-02-23 DIAGNOSIS — I5032 Chronic diastolic (congestive) heart failure: Secondary | ICD-10-CM | POA: Diagnosis present

## 2016-02-23 DIAGNOSIS — I420 Dilated cardiomyopathy: Secondary | ICD-10-CM | POA: Diagnosis not present

## 2016-02-23 DIAGNOSIS — N3281 Overactive bladder: Secondary | ICD-10-CM | POA: Diagnosis present

## 2016-02-23 DIAGNOSIS — E785 Hyperlipidemia, unspecified: Secondary | ICD-10-CM | POA: Diagnosis not present

## 2016-02-23 DIAGNOSIS — R54 Age-related physical debility: Secondary | ICD-10-CM | POA: Diagnosis not present

## 2016-02-23 DIAGNOSIS — I482 Chronic atrial fibrillation, unspecified: Secondary | ICD-10-CM | POA: Diagnosis present

## 2016-02-23 DIAGNOSIS — E1141 Type 2 diabetes mellitus with diabetic mononeuropathy: Secondary | ICD-10-CM | POA: Diagnosis not present

## 2016-02-23 DIAGNOSIS — E1151 Type 2 diabetes mellitus with diabetic peripheral angiopathy without gangrene: Secondary | ICD-10-CM | POA: Diagnosis not present

## 2016-02-23 DIAGNOSIS — Z8673 Personal history of transient ischemic attack (TIA), and cerebral infarction without residual deficits: Secondary | ICD-10-CM

## 2016-02-23 DIAGNOSIS — K259 Gastric ulcer, unspecified as acute or chronic, without hemorrhage or perforation: Secondary | ICD-10-CM | POA: Diagnosis present

## 2016-02-23 DIAGNOSIS — K254 Chronic or unspecified gastric ulcer with hemorrhage: Secondary | ICD-10-CM | POA: Diagnosis not present

## 2016-02-23 DIAGNOSIS — I739 Peripheral vascular disease, unspecified: Secondary | ICD-10-CM | POA: Diagnosis present

## 2016-02-23 DIAGNOSIS — I499 Cardiac arrhythmia, unspecified: Secondary | ICD-10-CM | POA: Diagnosis present

## 2016-02-23 DIAGNOSIS — R5383 Other fatigue: Secondary | ICD-10-CM | POA: Diagnosis present

## 2016-02-23 DIAGNOSIS — R042 Hemoptysis: Secondary | ICD-10-CM | POA: Diagnosis present

## 2016-02-23 DIAGNOSIS — I495 Sick sinus syndrome: Secondary | ICD-10-CM | POA: Diagnosis present

## 2016-02-23 DIAGNOSIS — M25511 Pain in right shoulder: Secondary | ICD-10-CM | POA: Diagnosis present

## 2016-02-23 DIAGNOSIS — I63311 Cerebral infarction due to thrombosis of right middle cerebral artery: Secondary | ICD-10-CM | POA: Diagnosis present

## 2016-02-23 DIAGNOSIS — Z7901 Long term (current) use of anticoagulants: Secondary | ICD-10-CM | POA: Diagnosis not present

## 2016-02-23 DIAGNOSIS — Z800 Family history of malignant neoplasm of digestive organs: Secondary | ICD-10-CM

## 2016-02-23 DIAGNOSIS — D649 Anemia, unspecified: Secondary | ICD-10-CM | POA: Diagnosis not present

## 2016-02-23 DIAGNOSIS — R000 Tachycardia, unspecified: Secondary | ICD-10-CM | POA: Diagnosis not present

## 2016-02-23 DIAGNOSIS — M899 Disorder of bone, unspecified: Secondary | ICD-10-CM | POA: Diagnosis present

## 2016-02-23 DIAGNOSIS — R531 Weakness: Secondary | ICD-10-CM

## 2016-02-23 HISTORY — DX: Gastrointestinal hemorrhage, unspecified: K92.2

## 2016-02-23 LAB — CBC
HCT: 24 % — ABNORMAL LOW (ref 36.0–46.0)
HEMATOCRIT: 20.7 % — AB (ref 36.0–46.0)
HEMOGLOBIN: 7.8 g/dL — AB (ref 12.0–15.0)
Hemoglobin: 6.5 g/dL — CL (ref 12.0–15.0)
MCH: 29.1 pg (ref 26.0–34.0)
MCH: 29.4 pg (ref 26.0–34.0)
MCHC: 31.4 g/dL (ref 30.0–36.0)
MCHC: 32.5 g/dL (ref 30.0–36.0)
MCV: 92.8 fL (ref 78.0–100.0)
MCV: 90.6 fL (ref 78.0–100.0)
Platelets: 443 10*3/uL — ABNORMAL HIGH (ref 150–400)
Platelets: 385 10*3/uL (ref 150–400)
RBC: 2.23 MIL/uL — ABNORMAL LOW (ref 3.87–5.11)
RBC: 2.65 MIL/uL — ABNORMAL LOW (ref 3.87–5.11)
RDW: 17 % — AB (ref 11.5–15.5)
RDW: 15.8 % — AB (ref 11.5–15.5)
WBC: 18.7 10*3/uL — AB (ref 4.0–10.5)
WBC: 17.2 10*3/uL — AB (ref 4.0–10.5)

## 2016-02-23 LAB — TROPONIN I
Troponin I: 0.03 ng/mL (ref <0.03)
Troponin I: 0.11 ng/mL (ref <0.03)
Troponin I: 0.16 ng/mL (ref <0.03)

## 2016-02-23 LAB — OCCULT BLOOD GASTRIC / DUODENUM (SPECIMEN CUP): OCCULT BLOOD, GASTRIC: POSITIVE — AB

## 2016-02-23 LAB — BASIC METABOLIC PANEL
ANION GAP: 13 (ref 5–15)
BUN: 26 mg/dL — ABNORMAL HIGH (ref 6–20)
CO2: 22 mmol/L (ref 22–32)
Calcium: 9.5 mg/dL (ref 8.9–10.3)
Chloride: 105 mmol/L (ref 101–111)
Creatinine, Ser: 1.19 mg/dL — ABNORMAL HIGH (ref 0.44–1.00)
GFR, EST AFRICAN AMERICAN: 47 mL/min — AB (ref >60)
GFR, EST NON AFRICAN AMERICAN: 40 mL/min — AB (ref >60)
GLUCOSE: 193 mg/dL — AB (ref 65–99)
POTASSIUM: 3.8 mmol/L (ref 3.5–5.1)
Sodium: 140 mmol/L (ref 135–145)

## 2016-02-23 LAB — CBC WITH DIFFERENTIAL/PLATELET
BASOS ABS: 0 10*3/uL (ref 0.0–0.1)
Basophils Relative: 0 %
EOS PCT: 1 %
Eosinophils Absolute: 0.1 10*3/uL (ref 0.0–0.7)
HEMATOCRIT: 16.9 % — AB (ref 36.0–46.0)
Hemoglobin: 5.1 g/dL — CL (ref 12.0–15.0)
LYMPHS PCT: 11 %
Lymphs Abs: 1.9 10*3/uL (ref 0.7–4.0)
MCH: 27.7 pg (ref 26.0–34.0)
MCHC: 30.2 g/dL (ref 30.0–36.0)
MCV: 91.8 fL (ref 78.0–100.0)
Monocytes Absolute: 0.9 10*3/uL (ref 0.1–1.0)
Monocytes Relative: 5 %
NEUTROS ABS: 14.3 10*3/uL — AB (ref 1.7–7.7)
Neutrophils Relative %: 83 %
PLATELETS: 490 10*3/uL — AB (ref 150–400)
RBC: 1.84 MIL/uL — AB (ref 3.87–5.11)
RDW: 18.3 % — ABNORMAL HIGH (ref 11.5–15.5)
WBC: 17.3 10*3/uL — AB (ref 4.0–10.5)

## 2016-02-23 LAB — PROTIME-INR
INR: 1.41
PROTHROMBIN TIME: 17.4 s — AB (ref 11.4–15.2)

## 2016-02-23 LAB — URINE MICROSCOPIC-ADD ON: RBC / HPF: NONE SEEN RBC/hpf (ref 0–5)

## 2016-02-23 LAB — URINALYSIS, ROUTINE W REFLEX MICROSCOPIC
Bilirubin Urine: NEGATIVE
GLUCOSE, UA: NEGATIVE mg/dL
HGB URINE DIPSTICK: NEGATIVE
KETONES UR: NEGATIVE mg/dL
Nitrite: NEGATIVE
PH: 6.5 (ref 5.0–8.0)
Protein, ur: NEGATIVE mg/dL
Specific Gravity, Urine: 1.017 (ref 1.005–1.030)

## 2016-02-23 LAB — GLUCOSE, CAPILLARY
GLUCOSE-CAPILLARY: 124 mg/dL — AB (ref 65–99)
GLUCOSE-CAPILLARY: 107 mg/dL — AB (ref 65–99)
GLUCOSE-CAPILLARY: 76 mg/dL (ref 65–99)
Glucose-Capillary: 102 mg/dL — ABNORMAL HIGH (ref 65–99)

## 2016-02-23 LAB — PREPARE RBC (CROSSMATCH)

## 2016-02-23 LAB — MRSA PCR SCREENING: MRSA BY PCR: NEGATIVE

## 2016-02-23 MED ORDER — ATORVASTATIN CALCIUM 10 MG PO TABS
5.0000 mg | ORAL_TABLET | Freq: Every day | ORAL | Status: DC
  Administered 2016-02-23 – 2016-02-25 (×3): 5 mg via ORAL
  Filled 2016-02-23 (×3): qty 1

## 2016-02-23 MED ORDER — PENTOXIFYLLINE ER 400 MG PO TBCR
400.0000 mg | EXTENDED_RELEASE_TABLET | Freq: Every day | ORAL | Status: DC
  Administered 2016-02-23 – 2016-02-26 (×3): 400 mg via ORAL
  Filled 2016-02-23 (×3): qty 1

## 2016-02-23 MED ORDER — ONDANSETRON HCL 4 MG/2ML IJ SOLN
4.0000 mg | Freq: Once | INTRAMUSCULAR | Status: AC
  Administered 2016-02-23: 4 mg via INTRAVENOUS
  Filled 2016-02-23: qty 2

## 2016-02-23 MED ORDER — INSULIN ASPART 100 UNIT/ML ~~LOC~~ SOLN
0.0000 [IU] | Freq: Three times a day (TID) | SUBCUTANEOUS | Status: DC
Start: 2016-02-23 — End: 2016-02-26

## 2016-02-23 MED ORDER — PANTOPRAZOLE SODIUM 40 MG IV SOLR
40.0000 mg | INTRAVENOUS | Status: DC
  Administered 2016-02-23 – 2016-02-25 (×2): 40 mg via INTRAVENOUS
  Filled 2016-02-23 (×2): qty 40

## 2016-02-23 MED ORDER — ONDANSETRON HCL 4 MG PO TABS: 4.0000 mg | ORAL_TABLET | Freq: Four times a day (QID) | ORAL | Status: DC | PRN

## 2016-02-23 MED ORDER — SENNOSIDES-DOCUSATE SODIUM 8.6-50 MG PO TABS: 1.0000 | ORAL_TABLET | Freq: Every evening | ORAL | Status: DC | PRN

## 2016-02-23 MED ORDER — PANTOPRAZOLE SODIUM 40 MG IV SOLR
40.0000 mg | Freq: Once | INTRAVENOUS | Status: AC
  Administered 2016-02-23: 40 mg via INTRAVENOUS
  Filled 2016-02-23: qty 40

## 2016-02-23 MED ORDER — ACETAMINOPHEN 650 MG RE SUPP: 650.0000 mg | Freq: Four times a day (QID) | RECTAL | Status: DC | PRN

## 2016-02-23 MED ORDER — SODIUM CHLORIDE 0.9 % IV SOLN
Freq: Once | INTRAVENOUS | Status: AC
  Administered 2016-02-23: 05:00:00 via INTRAVENOUS

## 2016-02-23 MED ORDER — ONDANSETRON HCL 4 MG/2ML IJ SOLN: 4.0000 mg | Freq: Four times a day (QID) | INTRAMUSCULAR | Status: DC | PRN

## 2016-02-23 MED ORDER — OXYBUTYNIN CHLORIDE ER 5 MG PO TB24
5.0000 mg | ORAL_TABLET | Freq: Every day | ORAL | Status: DC
  Administered 2016-02-23 – 2016-02-26 (×3): 5 mg via ORAL
  Filled 2016-02-23 (×4): qty 1

## 2016-02-23 MED ORDER — DEXTROSE 5 % IV SOLN
2.0000 g | INTRAVENOUS | Status: DC
  Administered 2016-02-23 – 2016-02-25 (×3): 2 g via INTRAVENOUS
  Filled 2016-02-23 (×3): qty 2

## 2016-02-23 MED ORDER — SODIUM CHLORIDE 0.9 % IV SOLN: INTRAVENOUS | Status: DC

## 2016-02-23 MED ORDER — HYDROCODONE-ACETAMINOPHEN 5-325 MG PO TABS
1.0000 | ORAL_TABLET | Freq: Four times a day (QID) | ORAL | Status: DC | PRN
  Administered 2016-02-23 – 2016-02-26 (×7): 1 via ORAL
  Filled 2016-02-23 (×7): qty 1

## 2016-02-23 MED ORDER — AMLODIPINE BESYLATE 10 MG PO TABS: 10.0000 mg | ORAL_TABLET | Freq: Every day | ORAL | Status: DC

## 2016-02-23 MED ORDER — GABAPENTIN 100 MG PO CAPS
200.0000 mg | ORAL_CAPSULE | Freq: Three times a day (TID) | ORAL | Status: DC
  Administered 2016-02-23 – 2016-02-26 (×9): 200 mg via ORAL
  Filled 2016-02-23 (×9): qty 2

## 2016-02-23 MED ORDER — SODIUM CHLORIDE 0.9 % IV SOLN
INTRAVENOUS | Status: AC
  Administered 2016-02-23 (×2): via INTRAVENOUS
  Administered 2016-02-23: 100 mL/h via INTRAVENOUS
  Administered 2016-02-24: 11:00:00 via INTRAVENOUS

## 2016-02-23 MED ORDER — SODIUM CHLORIDE 0.9% FLUSH
3.0000 mL | Freq: Two times a day (BID) | INTRAVENOUS | Status: DC
  Administered 2016-02-23 – 2016-02-25 (×5): 3 mL via INTRAVENOUS

## 2016-02-23 MED ORDER — ACETAMINOPHEN 325 MG PO TABS: 650.0000 mg | ORAL_TABLET | Freq: Four times a day (QID) | ORAL | Status: DC | PRN

## 2016-02-23 NOTE — H&P (Signed)
History and Physical    TANIKA BRACCO VQM:[id number] DOB: [birth year] DOA: 02/23/2016   PCP: Cathlean Cower, MD   Patient coming from:  Home    Chief Complaint: Hematemesis   HPI: NEFTALI THUROW is a 82 y.o. female with extensive medical history including atrial fibrillation, hypertension, diabetes, and it recent hospitalization for generalized weakness felt to be due to symptomatic anemia, at which time she received 2 units of blood, and was to follow up with G.I. Of note, she also has a history of G.I. bleed back in  May 2016, at which time she was taken off Plavix, but has not been seen by gastroenterology. The patient was on Eliquis until 2 days prior to admission, for atrial fibrillation, but these had been discontinued due to increased bruising. Today, she had 2 major episodes of hematemesis prior to admission, filling up a cup each time, bright red blood, with another  less intense episode on transport to the  ED. She has not had another episodes since admission. On presentation, she was very weak, but she denies any worsening  shortness of breath, or palpitations. She denies any chest pain. She denies any abdominal pain or cramping. She denies any diarrhea or, hematochezia. She has chronic lower extremity neuropathy, but there are no new musculoskeletal or neurological symptoms. No confusion has been noted. No fever chills or recent infections.     ED Course:  BP 115/64   Pulse (!) 149   Temp 98 F (36.7 C) (Oral)   Resp (!) 37   Ht '4\' 11"'$  (1.499 m)   Wt 50.8 kg (112 lb)   SpO2 97%   BMI 22.62 kg/m    Hemoccult was positive  hemoglobin was 5.1 she received IV fluids she is receiving 2 units of blood, with the third one being ordered. Sodium 140 potassium 3.8 creatinine 1.19, was 0.992 weeks ago EGFR is 40 last Iron studies on 02/04/2016 showed iron of 20, TIBC 395, saturation 5 and 1721, with folate 54.3.  White count 17.3,( was 17.4 2 weeks ago). Hemoglobin 5.1, MCV 91.8  platelets 490  Review of Systems: As per HPI otherwise 10 point review of systems negative.   Past Medical History:  Diagnosis Date  . Atrial fibrillation ([place])   . CAROTID STENOSIS   . CVA (cerebrovascular accident) ([place]) 2002  . DIABETES MELLITUS, TYPE II   . Diverticulosis of colon   . DIVERTICULOSIS, COLON   . DM (diabetes mellitus), type 2 ([place])   . DVT (deep venous thrombosis) ([place])   . GERD   . GERD (gastroesophageal reflux disease)   . HTN (hypertension)   . Hx of adenomatous colonic polyps 2007  . Hyperlipidemia   . HYPERLIPIDEMIA   . Hypertension   . Osteopenia   . OSTEOPENIA   . PVD (peripheral vascular disease) ([place])    recently dx w/ carotid -left  int carotid >90% 50-60% rt int carotid 02/2010  . PVD (peripheral vascular disease) (HCC)    lf  ABI 0.11 and rt 0.43  . Sinus node dysfunction ([place]) 2011  . Stroke [facility]) July 01, 2014   Affected Left side    Past Surgical History:  Procedure Laterality Date  . CAROTID ENDARTERECTOMY    . cataract eye surgery    . CNS vascular stent per Neurosurgeon    . EYE SURGERY    . ILIAC ARTERY STENT  2011   Bilateral CIA stent  . lazer eye surgery    . Right  Shoulder Surgery  2010  . TUBAL LIGATION      Social History Social History   Social History  . Marital status: Widowed    Spouse name: N/A  . Number of children: 5  . Years of education: N/A   Occupational History  . retired Orthoptist   Social History Main Topics  . Smoking status: Never Smoker  . Smokeless tobacco: Never Used  . Alcohol use No  . Drug use: No  . Sexual activity: Not on file   Other Topics Concern  . Not on file   Social History Narrative   ** Merged History Encounter **         No Known Allergies  Family History  Problem Relation Age of Onset  . Cancer Mother     colon  . Diabetes Sister     Amputation      Prior to Admission medications   Medication Sig Start Date End Date Taking? Authorizing Provider  amLODipine  (NORVASC) 5 MG tablet Take 2 tablets (10 mg total) by mouth daily. 02/07/16  Yes Mariel Aloe, MD  atorvastatin (LIPITOR) 10 MG tablet 1/2 tab by mouth daily Patient taking differently: Take 5 mg by mouth daily at 6 PM. 1/2 tab by mouth daily 11/18/15  Yes Biagio Borg, MD  Calcium Carbonate (CALTRATE 600) 1500 MG TABS Take 1 tablet by mouth 2 (two) times daily.    Yes Historical Provider, MD  feeding supplement, ENSURE ENLIVE, (ENSURE ENLIVE) LIQD Take 237 mLs by mouth 2 (two) times daily between meals. 02/07/16  Yes Mariel Aloe, MD  fenofibrate micronized (LOFIBRA) 134 MG capsule TAKE 1 CAPSULE (134 MG TOTAL) BY MOUTH DAILY. 04/17/15  Yes Biagio Borg, MD  gabapentin (NEURONTIN) 100 MG capsule Take 2 capsules (200 mg total) by mouth 3 (three) times daily. 02/07/16  Yes Mariel Aloe, MD  HYDROcodone-acetaminophen (NORCO/VICODIN) 5-325 MG tablet Take 1 tablet by mouth every 6 (six) hours as needed for moderate pain. 02/12/16  Yes Biagio Borg, MD  Multiple Vitamin (MULTIVITAMIN) capsule Take 1 capsule by mouth daily. Centrum Silver   Yes Historical Provider, MD  Multiple Vitamins-Minerals (PRESERVISION AREDS 2) CAPS Take 1 capsule by mouth 2 (two) times daily.   Yes Historical Provider, MD  oxybutynin (DITROPAN-XL) 5 MG 24 hr tablet TAKE 1 TABLET BY MOUTH EVERY DAY 10/27/15  Yes Biagio Borg, MD  pentoxifylline (TRENTAL) 400 MG CR tablet TAKE 1 TABLET (400 MG TOTAL) BY MOUTH DAILY. 04/17/15  Yes Biagio Borg, MD  predniSONE (DELTASONE) 20 MG tablet Take '40mg'$  (2 tablets) x 3 days, '20mg'$  (1 tablet) x 3 days, '10mg'$  (1/2 tablet) x 4 days Patient not taking: Reported on 02/23/2016 02/07/16   Mariel Aloe, MD    Physical Exam:    Vitals:   02/23/16 0545 02/23/16 0600 02/23/16 0615 02/23/16 0630  BP: 113/55 (!) 92/46 120/95 115/64  Pulse: 106 116 90 (!) 149  Resp: 24 (!) 31 24 (!) 37  Temp:      TempSrc:      SpO2: 96% 98% 96% 97%  Weight:      Height:           Constitutional: NAD, calm,  comfortable , weak appearing  Vitals:   02/23/16 0545 02/23/16 0600 02/23/16 0615 02/23/16 0630  BP: 113/55 (!) 92/46 120/95 115/64  Pulse: 106 116 90 (!) 149  Resp: 24 (!) 31 24 (!) 37  Temp:  TempSrc:      SpO2: 96% 98% 96% 97%  Weight:      Height:       Eyes: PERRL, lids and conjunctivae normal, no icterus ENMT: Mucous membranes are moist. Posterior pharynx clear of any exudate or lesions.Normal dentition.  Neck: normal, supple, no masses, no thyromegaly Respiratory: clear to auscultation bilaterally, no wheezing, no crackles. Normal respiratory effort. No accessory muscle use.  Cardiovascular: tachy and irregularly irregular, no murmurs / rubs / gallops. No extremity edema. 2+ pedal pulses. No carotid bruits.  Abdomen: no tenderness, no masses palpated. No hepatosplenomegaly. Bowel sounds positive.  Musculoskeletal: no clubbing / cyanosis. No joint deformity upper and lower extremities. Good ROM, no contractures. Normal muscle tone.  Skin: no rashes, lesions, ulcers. minimal bruising in the ankle area Neurologic: CN 2-12 grossly intact. Sensation intact, DTR normal. Strength 5/5 in all 4.  Psychiatric: Mildly decreased  judgment and insight, likely due to advanced age. Alert and oriented x 3. Normal mood.     Labs on Admission: I have personally reviewed following labs and imaging studies  CBC:  Recent Labs Lab 02/23/16 0345  WBC 17.3*  NEUTROABS 14.3*  HGB 5.1*  HCT 16.9*  MCV 91.8  PLT 490*    Basic Metabolic Panel:  Recent Labs Lab 02/23/16 0345  NA 140  K 3.8  CL 105  CO2 22  GLUCOSE 193*  BUN 26*  CREATININE 1.19*  CALCIUM 9.5    GFR: Estimated Creatinine Clearance: 23.1 mL/min (by C-G formula based on SCr of 1.19 mg/dL (H)).  Liver Function Tests: No results for input(s): AST, ALT, ALKPHOS, BILITOT, PROT, ALBUMIN in the last 168 hours. No results for input(s): LIPASE, AMYLASE in the last 168 hours. No results for input(s): AMMONIA in the  last 168 hours.  Coagulation Profile: No results for input(s): INR, PROTIME in the last 168 hours.  Cardiac Enzymes: No results for input(s): CKTOTAL, CKMB, CKMBINDEX, TROPONINI in the last 168 hours.  BNP (last 3 results) No results for input(s): PROBNP in the last 8760 hours.  HbA1C: No results for input(s): HGBA1C in the last 72 hours.  CBG: No results for input(s): GLUCAP in the last 168 hours.  Lipid Profile: No results for input(s): CHOL, HDL, LDLCALC, TRIG, CHOLHDL, LDLDIRECT in the last 72 hours.  Thyroid Function Tests: No results for input(s): TSH, T4TOTAL, FREET4, T3FREE, THYROIDAB in the last 72 hours.  Anemia Panel: No results for input(s): VITAMINB12, FOLATE, FERRITIN, TIBC, IRON, RETICCTPCT in the last 72 hours.  Urine analysis:    Component Value Date/Time   COLORURINE YELLOW 02/13/2016 1201   APPEARANCEUR CLOUDY (A) 02/13/2016 1201   LABSPEC 1.020 02/13/2016 1201   PHURINE 8.0 02/13/2016 1201   GLUCOSEU NEGATIVE 02/13/2016 1201   HGBUR NEGATIVE 02/13/2016 1201   BILIRUBINUR NEGATIVE 02/13/2016 1201   KETONESUR NEGATIVE 02/13/2016 1201   PROTEINUR NEGATIVE 07/01/2014 1510   UROBILINOGEN 0.2 02/13/2016 1201   NITRITE NEGATIVE 02/13/2016 1201   LEUKOCYTESUR NEGATIVE 02/13/2016 1201    Sepsis Labs: '@LABRCNTIP'$ (procalcitonin:4,lacticidven:4) ) Recent Results (from the past 240 hour(s))  Urine culture     Status: None   Collection Time: 02/13/16 12:01 PM  Result Value Ref Range Status   Organism ID, Bacteria NO GROWTH  Final     Radiological Exams on Admission: No results found.  EKG: Independently reviewed.  Assessment/Plan Active Problems:   Hematochezia   Hemoptysis   Diabetes ([place])   Hyperlipidemia   Essential hypertension   SICK SINUS SYNDROME  GERD   Disorder of bone and cartilage   History of colonic polyps   Sinus bradycardia   Peripheral vascular disease (HCC)   Fatigue   Altered sensation due to stroke (HCC)   LGI bleed    Cerebral infarction due to thrombosis of right middle cerebral artery (HCC)   Weakness   Overactive bladder   Trigger point of right shoulder region   Irregular heart beats   General weakness   A-fib (HCC)   AKI (acute kidney injury) ([place])   Gastro Intestinal Bleed, Higher, likely due to recent  Eliquis, (d/c's 2 days prior to admission due to increased bruising). Hb 5.1, receiving 2 units of blood  BUN is mildly elevated at 26   Will add another unit in view of active bleeding, and transfuse for Hgb less than 8 . Received PPI IV x1 Received IVF  Admit to stepdown   IV protonix. IVF at 100 cc/h  Hold BP meds NPO except for sips of meds  Dr. Gastroenterology has been consulted. Hold NSAIDs Other instructions as per GI   Atrial Fibrillation was on anticoagulation with Eliquis, now d/c due to above   Rate controlled -Continue meds in am SCDs Repeat 2 D echo    Hyperlipidemia Continue home statins    Type II Diabetes with neuropathy Current blood sugar level is 193 Lab Results  Component Value Date   HGBA1C 5.7 05/27/2015  Hgb A1C SSI Continue neurontin  Chronic kidney disease stage  3  baseline creatinine    Current Cr 1.19  Lab Results  Component Value Date   CREATININE 1.19 (H) 02/23/2016   CREATININE 0.99 02/12/2016   CREATININE 1.56 (H) 02/07/2016  IVF Repeat CMET in am    Hypertension BP 113/95   Pulse 45   Hold  home anti-hypertensive medications for now in view of active GIB , and will continue to monitor   History of CVA, remote, no acute issues at this time  Deconditioning OT/PT   DVT prophylaxis:   SCD's    Code Status:   Full    Family Communication:  Discussed with patient family  Disposition Plan: Expect patient to be discharged to home after condition improves Consults called:    GI consult appreciated  Admission status:  SDU    Rondel Jumbo, PA-C [facility]   02/23/2016, 7:26 AM

## 2016-02-23 NOTE — ED Notes (Signed)
Pt assisted to bedside commode w/ 1 staff assist. Linens changed.

## 2016-02-23 NOTE — ED Notes (Signed)
Gastroenterologist at bedside.

## 2016-02-23 NOTE — ED Notes (Signed)
Admitting at bedside 

## 2016-02-23 NOTE — ED Provider Notes (Signed)
[facility]-EMERGENCY DEPT Provider Note   CSN: [id number] Arrival date & time: 02/23/16  9562  By signing my name below, I, Tracy [name], attest that this documentation has been prepared under the direction and in the presence of [name], MD. Electronically Signed: Doreatha [name], ED Scribe. 02/23/16. 3:56 AM.     History   Chief Complaint Chief Complaint  Patient presents with  . GI Bleeding   LEVEL 5 CAVEAT: HPI and ROS limited due to absence of caregiver/spouse     HPI Tracy [name] is a 82 y.o. female brought in by ambulance with h/o HTN, Afib, DM2 who presents to the Emergency Department complaining of multiple episodes of bloody emesis that began several hours ago with associated mild abdominal pain. Per husband, pt had similar symptoms with GI bleed in 08/2014 and was taken off Plavix at that time. Pt was not able to recall this hospitalization. She was also recently admitted to the hospital for weakness on 02/04/16 and treated for anemia of unknown cause with 2u PRBC; fecal occult blood test was negative. No GI workup at that time. Pt denies melena, hematochezia. Patient is a very poor historian. Per EMS, she was tachycardic but blood pressure was stable in route.  I reviewed the patient's med list. It does appear that she is no longer on Eliquis.  She is only taking a baby aspirin.  The history is provided by the patient and the spouse. The history is limited by the absence of a caregiver. No language interpreter was used.    Past Medical History:  Diagnosis Date  . Atrial fibrillation (HCC)   . CAROTID STENOSIS   . CVA (cerebrovascular accident) (HCC) 2002  . DIABETES MELLITUS, TYPE II   . Diverticulosis of colon   . DIVERTICULOSIS, COLON   . DM (diabetes mellitus), type 2 (HCC)   . DVT (deep venous thrombosis) (HCC)   . GERD   . GERD (gastroesophageal reflux disease)   . HTN (hypertension)   . Hx of adenomatous colonic polyps 2007  . Hyperlipidemia   .  HYPERLIPIDEMIA   . Hypertension   . Osteopenia   . OSTEOPENIA   . PVD (peripheral vascular disease) (HCC)    recently dx w/ carotid -left  int carotid >90% 50-60% rt int carotid 02/2010  . PVD (peripheral vascular disease) (HCC)    lf  ABI 0.11 and rt 0.43  . Sinus node dysfunction (HCC) 2011  . Stroke [facility]) July 01, 2014   Affected Left side    Patient Active Problem List   Diagnosis Date Noted  . Right lumbar radiculopathy 02/12/2016  . Superficial bruising of foot 02/12/2016  . Hemoptysis 02/12/2016  . Irregular heart beats 02/04/2016  . General weakness 02/04/2016  . A-fib (HCC) 02/04/2016  . Anemia, iron deficiency 02/04/2016  . Hypercalcemia 02/04/2016  . AKI (acute kidney injury) (HCC) 02/04/2016  . Right sided sciatica 11/18/2015  . Trigger point of right shoulder region 05/13/2015  . Cerebrovascular accident (CVA) due to thrombosis of right posterior cerebral artery (HCC) 04/17/2015  . PVD (peripheral vascular disease) (HCC) 04/17/2015  . Overactive bladder 11/15/2014  . Weakness 10/24/2014  . Cerebral infarction due to thrombosis of right middle cerebral artery (HCC) 10/17/2014  . LGI bleed 08/22/2014  . Monoplegia of left lower extremity (HCC) 08/16/2014  . Gait disturbance, post-stroke 08/16/2014  . Hematochezia 08/13/2014  . Altered sensation due to stroke (HCC) 07/15/2014  . Hip pain, acute   . Bradycardia 07/01/2014  .  Elevated troponin 07/01/2014  . Dysuria 07/01/2014  . Constipation 07/01/2014  . Right ankle pain 01/29/2014  . Rotator cuff tear arthropathy of right shoulder 06/19/2013  . Left rotator cuff tear arthropathy 05/17/2013  . Fatigue 02/15/2012  . Aneurysm of iliac artery (HCC) 01/26/2012  . Peripheral vascular disease, unspecified 01/26/2012  . Left shoulder pain 02/09/2011  . Preventative health care 08/02/2010  . Sinus bradycardia 05/29/2010  . SICK SINUS SYNDROME 03/10/2010  . CAROTID STENOSIS 03/10/2010  . PVD WITH CLAUDICATION  02/23/2010  . CONSTIPATION 10/01/2008  . NECK MASS 10/01/2008  . Diabetes (HCC) 08/01/2007  . DIVERTICULOSIS, COLON 08/01/2007  . SHOULDER PAIN, RIGHT 08/01/2007  . COLONIC POLYPS, HX OF 08/01/2007  . GERD 12/25/2006  . OSTEOPENIA 12/25/2006  . Hyperlipidemia 11/29/2006  . Essential hypertension 11/29/2006    Past Surgical History:  Procedure Laterality Date  . CAROTID ENDARTERECTOMY    . cataract eye surgery    . CNS vascular stent per Neurosurgeon    . EYE SURGERY    . ILIAC ARTERY STENT  2011   Bilateral CIA stent  . lazer eye surgery    . Right Shoulder Surgery  2010  . TUBAL LIGATION      OB History    Gravida Para Term Preterm AB Living   0 0 0 0 0     SAB TAB Ectopic Multiple Live Births   0 0 0           Home Medications    Prior to Admission medications   Medication Sig Start Date End Date Taking? Authorizing Provider  amLODipine (NORVASC) 5 MG tablet Take 2 tablets (10 mg total) by mouth daily. 02/07/16  Yes Narda Bondsalph A Nettey, MD  atorvastatin (LIPITOR) 10 MG tablet 1/2 tab by mouth daily Patient taking differently: Take 5 mg by mouth daily at 6 PM. 1/2 tab by mouth daily 11/18/15  Yes Corwin LevinsJames W John, MD  Calcium Carbonate (CALTRATE 600) 1500 MG TABS Take 1 tablet by mouth 2 (two) times daily.    Yes Historical Provider, MD  feeding supplement, ENSURE ENLIVE, (ENSURE ENLIVE) LIQD Take 237 mLs by mouth 2 (two) times daily between meals. 02/07/16  Yes Narda Bondsalph A Nettey, MD  fenofibrate micronized (LOFIBRA) 134 MG capsule TAKE 1 CAPSULE (134 MG TOTAL) BY MOUTH DAILY. 04/17/15  Yes Corwin LevinsJames W John, MD  gabapentin (NEURONTIN) 100 MG capsule Take 2 capsules (200 mg total) by mouth 3 (three) times daily. 02/07/16  Yes Narda Bondsalph A Nettey, MD  HYDROcodone-acetaminophen (NORCO/VICODIN) 5-325 MG tablet Take 1 tablet by mouth every 6 (six) hours as needed for moderate pain. 02/12/16  Yes Corwin LevinsJames W John, MD  Multiple Vitamin (MULTIVITAMIN) capsule Take 1 capsule by mouth daily. Centrum Silver    Yes Historical Provider, MD  Multiple Vitamins-Minerals (PRESERVISION AREDS 2) CAPS Take 1 capsule by mouth 2 (two) times daily.   Yes Historical Provider, MD  oxybutynin (DITROPAN-XL) 5 MG 24 hr tablet TAKE 1 TABLET BY MOUTH EVERY DAY 10/27/15  Yes Corwin LevinsJames W John, MD  pentoxifylline (TRENTAL) 400 MG CR tablet TAKE 1 TABLET (400 MG TOTAL) BY MOUTH DAILY. 04/17/15  Yes Corwin LevinsJames W John, MD  predniSONE (DELTASONE) 20 MG tablet Take 40mg  (2 tablets) x 3 days, 20mg  (1 tablet) x 3 days, 10mg  (1/2 tablet) x 4 days Patient not taking: Reported on 02/23/2016 02/07/16   Narda Bondsalph A Nettey, MD    Family History Family History  Problem Relation Age of Onset  . Cancer Mother  colon  . Diabetes Sister     Amputation    Social History Social History  Substance Use Topics  . Smoking status: Never Smoker  . Smokeless tobacco: Never Used  . Alcohol use No     Allergies   Patient has no known allergies.   Review of Systems Review of Systems  Unable to perform ROS: Other (absence of caregiver)  Respiratory: Negative for shortness of breath.   Cardiovascular: Negative for chest pain.  Gastrointestinal: Positive for abdominal pain, nausea and vomiting. Negative for blood in stool.  All other systems reviewed and are negative.    Physical Exam Updated Vital Signs BP 129/58   Pulse 96   Temp 98 F (36.7 C) (Oral)   Resp 18   Ht 4\' 11"  (1.499 m)   Wt 112 lb (50.8 kg)   SpO2 98%   BMI 22.62 kg/m   Physical Exam  Constitutional: She is oriented to person, place, and time. No distress.  Frail, elderly, no acute distress  HENT:  Head: Normocephalic and atraumatic.  Mucous membranes dry  Cardiovascular: Normal heart sounds.   Tachycardia, irregular rhythm  Pulmonary/Chest: Effort normal and breath sounds normal. No respiratory distress. She has no wheezes.  Abdominal: Soft. Bowel sounds are normal. There is tenderness. There is no rebound and no guarding.  Epigastric tenderness to palpation  without rebound or guarding  Neurological: She is alert and oriented to person, place, and time.  Skin: Skin is warm and dry.  Psychiatric: She has a normal mood and affect.  Nursing note and vitals reviewed.    ED Treatments / Results   COORDINATION OF CARE: 3:48 AM Discussed treatment plan with pt at bedside and pt agreed to plan.    Labs (all labs ordered are listed, but only abnormal results are displayed) Labs Reviewed  CBC WITH DIFFERENTIAL/PLATELET - Abnormal; Notable for the following:       Result Value   WBC 17.3 (*)    RBC 1.84 (*)    Hemoglobin 5.1 (*)    HCT 16.9 (*)    RDW 18.3 (*)    Platelets 490 (*)    Neutro Abs 14.3 (*)    All other components within normal limits  BASIC METABOLIC PANEL - Abnormal; Notable for the following:    Glucose, Bld 193 (*)    BUN 26 (*)    Creatinine, Ser 1.19 (*)    GFR calc non Af Amer 40 (*)    GFR calc Af Amer 47 (*)    All other components within normal limits  OCCULT BLOOD GASTRIC / DUODENUM (SPECIMEN CUP)  TYPE AND SCREEN  PREPARE RBC (CROSSMATCH)    EKG  EKG Interpretation  Date/Time:  Monday February 23 2016 03:36:56 EST Ventricular Rate:  103 PR Interval:    QRS Duration: 109 QT Interval:  332 QTC Calculation: 435 R Axis:   -40 Text Interpretation:  Atrial fibrillation Ventricular premature complex Left axis deviation Anteroseptal infarct, old Nonspecific repol abnormality, lateral leads Confirmed by Vonne Mcdanel  MD, Trei Schoch ([id number]) on 02/23/2016 5:53:03 AM       Radiology No results found.  Procedures Procedures (including critical care time)  CRITICAL CARE Performed by: [name]   Total critical care time: 45 minutes  Critical care time was exclusive of separately billable procedures and treating other patients.  Critical care was necessary to treat or prevent imminent or life-threatening deterioration.  Critical care was time spent personally by me on the following activities:  development of treatment plan with patient and/or surrogate as well as nursing, discussions with consultants, evaluation of patient's response to treatment, examination of patient, obtaining history from patient or surrogate, ordering and performing treatments and interventions, ordering and review of laboratory studies, ordering and review of radiographic studies, pulse oximetry and re-evaluation of patient's condition.   Medications Ordered in ED Medications  pantoprazole (PROTONIX) injection 40 mg (40 mg Intravenous Given 02/23/16 0401)  ondansetron (ZOFRAN) injection 4 mg (4 mg Intravenous Given 02/23/16 0409)  0.9 %  sodium chloride infusion ( Intravenous New Bag/Given 02/23/16 0519)     Initial Impression / Assessment and Plan / ED Course  I have reviewed the triage vital signs and the nursing notes.  Pertinent labs & imaging results that were available during my care of the patient were reviewed by me and considered in my medical decision making (see chart for details).  Clinical Course     Patient presents with hematemesis. Vital signs notable for tachycardia with rate from the 110s to 130s. Consistent with atrial fibrillation. Otherwise blood pressure is stable. She is actively vomiting both bright red and coffee-ground emesis. She was given IV Protonix. Nothing by mouth. She was also given IV Zofran. This improved her vomiting. Hemoglobin is 5. She was typed and screen and given 2 units of blood. Discussed with Dr. Randa EvensEdwards, gastroenterology. Continue nothing by mouth and he will evaluate the patient for endoscopy. Discussed with Dr. Toniann FailKakrakandy for admission to SDU.  Final Clinical Impressions(s) / ED Diagnoses   Final diagnoses:  Acute GI bleeding    New Prescriptions New Prescriptions   No medications on file   I personally performed the services described in this documentation, which was scribed in my presence. The recorded information has been reviewed and is accurate.     Shon Batonourtney F Lattie Cervi, MD 02/23/16 [phone]

## 2016-02-23 NOTE — ED Triage Notes (Signed)
Brought via ems from home for c/o vomiting blood.  Per patient weak for about a week then started vomiting blood tonight.  Bright red blood with some coffee ground emesis noted.  Per spouse had a gi bleed about a year ago and was taken blood thinner.  In afib on arrival with rate ranging from 80-130.  Reports "a little" pain in abdomen.

## 2016-02-23 NOTE — Progress Notes (Signed)
PT Cancellation Note  Patient Details Name: Tracy FinerOphelia W [name] MRN: [id number] DOB: 1927/03/29   Cancelled Treatment:    Reason Eval/Treat Not Completed: Patient not medically ready (per RN. )   Rada HayHill, Khaalid Lefkowitz Elizabeth 02/23/2016, 3:10 PM Blanchard KelchKaren Jaryn Rosko PT [phone]

## 2016-02-23 NOTE — ED Notes (Signed)
Consent obtained for blood transfusion.  

## 2016-02-23 NOTE — Consult Note (Signed)
Subjective:   HPI  The patient is an 82 year old female who presented to the emergency room with complaints of hematemesis. At 2:30 this morning she vomited blood which she described as red in color and a little later it was darker in color. She denies history of peptic ulcer disease. She has not been having abdominal pain. She does have a history of atrial fibrillation and had been on Eliquis about a week ago but only for a couple of days because it made her feet swell and started to bruise. She contacted her physician about this and it was discontinued. She has not taken this medication in several days. She has been taking some aspirin but not in a few days also. She denied melena or hematochezia.  Hemoglobin on admission is 5.1. She is currently receiving her second unit of packed red cells.  Review of Systems Denies chest pain or shortness of breath  Past Medical History:  Diagnosis Date  . Atrial fibrillation (HCC)   . CAROTID STENOSIS   . CVA (cerebrovascular accident) (HCC) 2002  . DIABETES MELLITUS, TYPE II   . Diverticulosis of colon   . DIVERTICULOSIS, COLON   . DM (diabetes mellitus), type 2 (HCC)   . DVT (deep venous thrombosis) (HCC)   . GERD   . GERD (gastroesophageal reflux disease)   . HTN (hypertension)   . Hx of adenomatous colonic polyps 2007  . Hyperlipidemia   . HYPERLIPIDEMIA   . Hypertension   . Osteopenia   . OSTEOPENIA   . PVD (peripheral vascular disease) (HCC)    recently dx w/ carotid -left  int carotid >90% 50-60% rt int carotid 02/2010  . PVD (peripheral vascular disease) (HCC)    lf  ABI 0.11 and rt 0.43  . Sinus node dysfunction (HCC) 2011  . Stroke [facility](H[facility]) July 01, 2014   Affected Left side   Past Surgical History:  Procedure Laterality Date  . CAROTID ENDARTERECTOMY    . cataract eye surgery    . CNS vascular stent per Neurosurgeon    . EYE SURGERY    . ILIAC ARTERY STENT  2011   Bilateral CIA stent  . lazer eye surgery    . Right  Shoulder Surgery  2010  . TUBAL LIGATION     Social History   Social History  . Marital status: Widowed    Spouse name: N/A  . Number of children: 5  . Years of education: N/A   Occupational History  . retired [employer]   Social History Main Topics  . Smoking status: Never Smoker  . Smokeless tobacco: Never Used  . Alcohol use No  . Drug use: No  . Sexual activity: Not on file   Other Topics Concern  . Not on file   Social History Narrative   ** Merged History Encounter **       family history includes Cancer in her mother; Diabetes in her sister.  Current Facility-Administered Medications:  .  0.9 %  sodium chloride infusion, , Intravenous, Continuous, Sung AmabileSara E Wertman, PA-C .  acetaminophen (TYLENOL) tablet 650 mg, 650 mg, Oral, Q6H PRN **OR** acetaminophen (TYLENOL) suppository 650 mg, 650 mg, Rectal, Q6H PRN, Marcos EkeSara E Wertman, PA-C .  Melene Muller[START ON 02/24/2016] amLODipine (NORVASC) tablet 10 mg, 10 mg, Oral, Daily, Marcos EkeSara E Wertman, PA-C .  atorvastatin (LIPITOR) tablet 5 mg, 5 mg, Oral, q1800, Marcos EkeSara E Wertman, PA-C .  cefTRIAXone (ROCEPHIN) 2 g in dextrose 5 % 50 mL IVPB, 2 g, Intravenous,  Q24H, Rachel L Rumbarger, RPH .  gabapentin (NEURONTIN) capsule 200 mg, 200 mg, Oral, TID, Marcos EkeSara E Wertman, PA-C .  HYDROcodone-acetaminophen (NORCO/VICODIN) 5-325 MG per tablet 1 tablet, 1 tablet, Oral, Q6H PRN, Marcos EkeSara E Wertman, PA-C .  insulin aspart (novoLOG) injection 0-9 Units, 0-9 Units, Subcutaneous, TID WC, Sara E Wertman, PA-C .  ondansetron (ZOFRAN) tablet 4 mg, 4 mg, Oral, Q6H PRN **OR** ondansetron (ZOFRAN) injection 4 mg, 4 mg, Intravenous, Q6H PRN, Marcos EkeSara E Wertman, PA-C .  oxybutynin (DITROPAN-XL) 24 hr tablet 5 mg, 5 mg, Oral, Daily, Marcos EkeSara E Wertman, PA-C .  [START ON 02/24/2016] pantoprazole (PROTONIX) injection 40 mg, 40 mg, Intravenous, Q24H, Sara E Wertman, PA-C .  pentoxifylline (TRENTAL) CR tablet 400 mg, 400 mg, Oral, Daily, Marcos EkeSara E Wertman, PA-C .  senna-docusate (Senokot-S) tablet  1 tablet, 1 tablet, Oral, QHS PRN, Marcos EkeSara E Wertman, PA-C .  sodium chloride flush (NS) 0.9 % injection 3 mL, 3 mL, Intravenous, Q12H, Marcos EkeSara E Wertman, PA-C  Current Outpatient Prescriptions:  .  amLODipine (NORVASC) 5 MG tablet, Take 2 tablets (10 mg total) by mouth daily., Disp: 60 tablet, Rfl: 0 .  atorvastatin (LIPITOR) 10 MG tablet, 1/2 tab by mouth daily (Patient taking differently: Take 5 mg by mouth daily at 6 PM. 1/2 tab by mouth daily), Disp: 45 tablet, Rfl: 3 .  Calcium Carbonate (CALTRATE 600) 1500 MG TABS, Take 1 tablet by mouth 2 (two) times daily. , Disp: , Rfl:  .  feeding supplement, ENSURE ENLIVE, (ENSURE ENLIVE) LIQD, Take 237 mLs by mouth 2 (two) times daily between meals., Disp: 30 Bottle, Rfl: 1 .  fenofibrate micronized (LOFIBRA) 134 MG capsule, TAKE 1 CAPSULE (134 MG TOTAL) BY MOUTH DAILY., Disp: 90 capsule, Rfl: 3 .  gabapentin (NEURONTIN) 100 MG capsule, Take 2 capsules (200 mg total) by mouth 3 (three) times daily., Disp: 180 capsule, Rfl: 0 .  HYDROcodone-acetaminophen (NORCO/VICODIN) 5-325 MG tablet, Take 1 tablet by mouth every 6 (six) hours as needed for moderate pain., Disp: 60 tablet, Rfl: 0 .  Multiple Vitamin (MULTIVITAMIN) capsule, Take 1 capsule by mouth daily. Centrum Silver, Disp: , Rfl:  .  Multiple Vitamins-Minerals (PRESERVISION AREDS 2) CAPS, Take 1 capsule by mouth 2 (two) times daily., Disp: , Rfl:  .  oxybutynin (DITROPAN-XL) 5 MG 24 hr tablet, TAKE 1 TABLET BY MOUTH EVERY DAY, Disp: 90 tablet, Rfl: 3 .  pentoxifylline (TRENTAL) 400 MG CR tablet, TAKE 1 TABLET (400 MG TOTAL) BY MOUTH DAILY., Disp: 90 tablet, Rfl: 3 .  predniSONE (DELTASONE) 20 MG tablet, Take 40mg  (2 tablets) x 3 days, 20mg  (1 tablet) x 3 days, 10mg  (1/2 tablet) x 4 days (Patient not taking: Reported on 02/23/2016), Disp: 11 tablet, Rfl: 0 No Known Allergies   Objective:     BP 110/87   Pulse (!) 130   Temp 98.3 F (36.8 C) (Oral)   Resp 23   Ht 4\' 11"  (1.499 m)   Wt 50.8 kg (112  lb)   SpO2 97%   BMI 22.62 kg/m   She is alert and in no acute distress.  Nonicteric  Heart irregular rhythm  Lungs clear  Abdomen: Bowel sounds present, soft, nontender  Laboratory No components found for: D1    Assessment:     Upper gastrointestinal bleeding. Etiology unclear.  Multiple other medical problems as mentioned above.      Plan:     PPI therapy. Transfuse blood as necessary. She appears clinically stable at this time. We  will plan EGD tomorrow, sooner if necessary.

## 2016-02-23 NOTE — ED Notes (Signed)
Attempted to call report to floor x1. 

## 2016-02-24 ENCOUNTER — Encounter (HOSPITAL_COMMUNITY)
Admission: EM | Disposition: A | Discharge status: Home Health | Payer: Self-pay | Source: Home / Self Care | Attending: Internal Medicine

## 2016-02-24 ENCOUNTER — Inpatient Hospital Stay (HOSPITAL_COMMUNITY): Payer: Medicare Other | Admitting: Certified Registered"

## 2016-02-24 ENCOUNTER — Inpatient Hospital Stay (HOSPITAL_COMMUNITY): Payer: Medicare Other

## 2016-02-24 ENCOUNTER — Encounter (HOSPITAL_COMMUNITY): Payer: Self-pay | Admitting: *Deleted

## 2016-02-24 DIAGNOSIS — I5032 Chronic diastolic (congestive) heart failure: Secondary | ICD-10-CM

## 2016-02-24 DIAGNOSIS — E118 Type 2 diabetes mellitus with unspecified complications: Secondary | ICD-10-CM

## 2016-02-24 DIAGNOSIS — I482 Chronic atrial fibrillation, unspecified: Secondary | ICD-10-CM | POA: Diagnosis present

## 2016-02-24 DIAGNOSIS — E1165 Type 2 diabetes mellitus with hyperglycemia: Secondary | ICD-10-CM | POA: Diagnosis not present

## 2016-02-24 DIAGNOSIS — IMO0002 Reserved for concepts with insufficient information to code with codable children: Secondary | ICD-10-CM | POA: Diagnosis not present

## 2016-02-24 DIAGNOSIS — I5033 Acute on chronic diastolic (congestive) heart failure: Secondary | ICD-10-CM | POA: Diagnosis present

## 2016-02-24 DIAGNOSIS — I272 Pulmonary hypertension, unspecified: Secondary | ICD-10-CM | POA: Diagnosis present

## 2016-02-24 DIAGNOSIS — K922 Gastrointestinal hemorrhage, unspecified: Secondary | ICD-10-CM

## 2016-02-24 DIAGNOSIS — I4891 Unspecified atrial fibrillation: Secondary | ICD-10-CM

## 2016-02-24 DIAGNOSIS — I420 Dilated cardiomyopathy: Secondary | ICD-10-CM | POA: Diagnosis present

## 2016-02-24 DIAGNOSIS — D62 Acute posthemorrhagic anemia: Secondary | ICD-10-CM | POA: Diagnosis present

## 2016-02-24 HISTORY — PX: ESOPHAGOGASTRODUODENOSCOPY (EGD) WITH PROPOFOL: SHX5813

## 2016-02-24 LAB — COMPREHENSIVE METABOLIC PANEL
ALT: 23 U/L (ref 14–54)
ANION GAP: 8 (ref 5–15)
AST: 39 U/L (ref 15–41)
Albumin: 2.2 g/dL — ABNORMAL LOW (ref 3.5–5.0)
Alkaline Phosphatase: 30 U/L — ABNORMAL LOW (ref 38–126)
BUN: 28 mg/dL — ABNORMAL HIGH (ref 6–20)
CHLORIDE: 113 mmol/L — AB (ref 101–111)
CO2: 23 mmol/L (ref 22–32)
Calcium: 9.1 mg/dL (ref 8.9–10.3)
Creatinine, Ser: 0.94 mg/dL (ref 0.44–1.00)
GFR calc non Af Amer: 53 mL/min — ABNORMAL LOW (ref >60)
Glucose, Bld: 88 mg/dL (ref 65–99)
POTASSIUM: 4.1 mmol/L (ref 3.5–5.1)
SODIUM: 144 mmol/L (ref 135–145)
Total Bilirubin: 0.5 mg/dL (ref 0.3–1.2)
Total Protein: 5.1 g/dL — ABNORMAL LOW (ref 6.5–8.1)

## 2016-02-24 LAB — GLUCOSE, CAPILLARY
GLUCOSE-CAPILLARY: 70 mg/dL (ref 65–99)
GLUCOSE-CAPILLARY: 143 mg/dL — AB (ref 65–99)
GLUCOSE-CAPILLARY: 78 mg/dL (ref 65–99)
Glucose-Capillary: 74 mg/dL (ref 65–99)
Glucose-Capillary: 103 mg/dL — ABNORMAL HIGH (ref 65–99)
Glucose-Capillary: 78 mg/dL (ref 65–99)
Glucose-Capillary: 92 mg/dL (ref 65–99)

## 2016-02-24 LAB — CBC
HEMATOCRIT: 23.8 % — AB (ref 36.0–46.0)
HEMOGLOBIN: 7.7 g/dL — AB (ref 12.0–15.0)
MCH: 29.5 pg (ref 26.0–34.0)
MCHC: 32.4 g/dL (ref 30.0–36.0)
MCV: 91.2 fL (ref 78.0–100.0)
Platelets: 360 10*3/uL (ref 150–400)
RBC: 2.61 MIL/uL — AB (ref 3.87–5.11)
RDW: 16.6 % — ABNORMAL HIGH (ref 11.5–15.5)
WBC: 15.2 10*3/uL — ABNORMAL HIGH (ref 4.0–10.5)

## 2016-02-24 LAB — ECHOCARDIOGRAM COMPLETE
Height: 59 in
Weight: 1587.31 oz

## 2016-02-24 LAB — PROTIME-INR
INR: 1.31
Prothrombin Time: 16.4 seconds — ABNORMAL HIGH (ref 11.4–15.2)

## 2016-02-24 LAB — HEMOGLOBIN A1C
HEMOGLOBIN A1C: 5.2 % (ref 4.8–5.6)
Mean Plasma Glucose: 103 mg/dL

## 2016-02-24 LAB — PREPARE RBC (CROSSMATCH)

## 2016-02-24 LAB — HEMOGLOBIN AND HEMATOCRIT, BLOOD
HEMATOCRIT: 32.2 % — AB (ref 36.0–46.0)
HEMOGLOBIN: 10.3 g/dL — AB (ref 12.0–15.0)

## 2016-02-24 SURGERY — ESOPHAGOGASTRODUODENOSCOPY (EGD) WITH PROPOFOL
Anesthesia: Monitor Anesthesia Care

## 2016-02-24 MED ORDER — PROPOFOL 500 MG/50ML IV EMUL
INTRAVENOUS | Status: DC | PRN
  Administered 2016-02-24: 50 ug/kg/min via INTRAVENOUS

## 2016-02-24 MED ORDER — PROPOFOL 10 MG/ML IV BOLUS
INTRAVENOUS | Status: DC | PRN
Start: 2016-02-24 — End: 2016-02-24
  Administered 2016-02-24: 30 mg via INTRAVENOUS

## 2016-02-24 MED ORDER — BUTAMBEN-TETRACAINE-BENZOCAINE 2-2-14 % EX AERO
INHALATION_SPRAY | CUTANEOUS | Status: DC | PRN
  Administered 2016-02-24: 1 via TOPICAL

## 2016-02-24 MED ORDER — LIDOCAINE HCL (CARDIAC) 20 MG/ML IV SOLN
INTRAVENOUS | Status: DC | PRN
  Administered 2016-02-24: 100 mg via INTRATRACHEAL

## 2016-02-24 MED ORDER — SODIUM CHLORIDE 0.9 % IV SOLN: Freq: Once | INTRAVENOUS | Status: DC

## 2016-02-24 MED ORDER — DILTIAZEM HCL 100 MG IV SOLR
5.0000 mg/h | INTRAVENOUS | Status: AC
  Administered 2016-02-24: 5 mg/h via INTRAVENOUS
  Filled 2016-02-24 (×2): qty 100

## 2016-02-24 MED ORDER — DEXTROSE 50 % IV SOLN
INTRAVENOUS | Status: AC
  Administered 2016-02-24: 25 mL via INTRAVENOUS
  Filled 2016-02-24: qty 50

## 2016-02-24 NOTE — Progress Notes (Signed)
PT Cancellation Note  Patient Details Name: Tracy FinerOphelia W [name] MRN: [id number] DOB: June 29, 1926   Cancelled Treatment:    Reason Eval/Treat Not Completed: Patient at procedure or test/unavailable, attempted evaluation, patient OTF for procedure at this time.    Fabio AsaDevon J Emani Taussig 02/24/2016, 10:49 AM Charlotte Crumbevon Thinh Cuccaro, PT DPT  [phone]

## 2016-02-24 NOTE — Anesthesia Postprocedure Evaluation (Signed)
Anesthesia Post Note  Patient: Tracy [name]  Procedure(s) Performed: Procedure(s) (LRB): ESOPHAGOGASTRODUODENOSCOPY (EGD) WITH PROPOFOL (N/A)  Patient location during evaluation: Endoscopy Anesthesia Type: MAC Level of consciousness: awake Pain management: pain level controlled Vital Signs Assessment: post-procedure vital signs reviewed and stable Respiratory status: spontaneous breathing Cardiovascular status: stable Postop Assessment: no signs of nausea or vomiting Anesthetic complications: no    Last Vitals:  Vitals:   02/24/16 1145 02/24/16 1251  BP: (!) 129/55 105/73  Pulse: (!) 101   Resp: 19   Temp:  36.4 C    Last Pain:  Vitals:   02/24/16 1251  TempSrc: Oral  PainSc:                  Tracy [name]

## 2016-02-24 NOTE — Care Management Note (Signed)
Case Management Note  Patient Details  Name: Tracy [name] MRN: [id number] Date of Birth: [birth year]  Subjective/Objective:   UGI bleed                 Action/Plan: Discharge Planning:  NCM spoke to pt, dtr, Lynden Angathy and son, Lambert Ketolonzo at bedside. Pt lives at home with son, Lambert Ketolonzo. Pt has wheelchair, RW, Rollator, bedside commode, and shower stool at home. [facility] Will check to see if pt will need oxygen for home.   PCP Corwin LevinsJOHN, JAMES W MD  Expected Discharge Date:                Expected Discharge Plan:  Home w Home Health Services  In-House Referral:  NA  Discharge planning Services  CM Consult  Post Acute Care Choice:  Home Health, Resumption of Svcs/PTA Provider Choice offered to:  Adult Children  DME Arranged:  N/A DME Agency:  NA  HH Arranged:  PT, OT HH Agency:  [facility]  Status of Service:  In process, will continue to follow  If discussed at Long Length of Stay Meetings, dates discussed:    Additional Comments:  Elliot CousinShavis, Neetu Carrozza Ellen, RN 02/24/2016, 4:33 PM

## 2016-02-24 NOTE — Progress Notes (Signed)
Tracy [name] is a 82 y.o. female has presented with GI bleed.   The various methods of treatment have been discussed with the patient and family. After consideration of risks, benefits and other options for treatment, the patient's son  has consented to  Procedure(s): EGD as a surgical intervention .Consent obtained from son.  The patient's history has been reviewed, patient examined, no change in status, stable for surgery.  I have reviewed the patient's chart and labs.  Questions were answered to the patient's satisfaction.

## 2016-02-24 NOTE — Anesthesia Procedure Notes (Signed)
Procedure Name: MAC Date/Time: 02/24/2016 10:58 AM Performed by: Rosiland OzMEYERS, Iveth Heidemann Pre-anesthesia Checklist: Emergency Drugs available, Suction available, Patient identified, Timeout performed and Patient being monitored Patient Re-evaluated:Patient Re-evaluated prior to inductionOxygen Delivery Method: Nasal cannula

## 2016-02-24 NOTE — Op Note (Addendum)
[facility] Patient Name: Tracy [name] Procedure Date : 02/24/2016 MRN: [id number] Attending MD: Kathi Der , MD Date of Birth: Jun 22, 1926 CSN: [id number] Age: 82 Admit Type: Inpatient Procedure:                Upper GI endoscopy Indications:              Hematemesis Providers:                Kathi Der, MD, Will Bonnet RN, RN,                            Darletta Moll Tech, Technician, Rosiland Oz, CRNA Referring MD:              Medicines:                Sedation Administered by an Anesthesia Professional Complications:            No immediate complications. Estimated Blood Loss:     Estimated blood loss: none. Procedure:                Pre-Anesthesia Assessment:                           - Prior to the procedure, a History and Physical                            was performed, and patient medications and                            allergies were reviewed. The patient's tolerance of                            previous anesthesia was also reviewed. The risks                            and benefits of the procedure and the sedation                            options and risks were discussed with the patient.                            All questions were answered, and informed consent                            was obtained. Prior Anticoagulants: The patient has                            taken Eliquis (apixaban), last dose was 1 week                            prior to procedure. ASA Grade Assessment: IV - A                            patient with severe systemic disease that is a  constant threat to life. After reviewing the risks                            and benefits, the patient was deemed in                            satisfactory condition to undergo the procedure.                           After obtaining informed consent, the endoscope was                            passed under direct vision. Throughout the                             procedure, the patient's blood pressure, pulse, and                            oxygen saturations were monitored continuously. The                            Endoscope was introduced through the mouth, and                            advanced to the second part of duodenum. The upper                            GI endoscopy was accomplished with ease. The                            patient tolerated the procedure well. Scope In: Scope Out: Findings:      A large 4 cm hiatal hernia was present.      One superficial esophageal ulcer at GE junction with no bleeding and no       stigmata of recent bleeding was found 36 to 37 cm from the incisors. The       lesion was 15 mm in largest dimension.      One non-bleeding cratered gastric ulcer with no stigmata of bleeding was       found in the gastric fundus. The lesion was 25 mm in largest dimension.       Biopsies were taken with a cold forceps for histology.      Retained fluid was found in the gastric fundus and in the gastric body.      The duodenal bulb, first portion of the duodenum and second portion of       the duodenum were normal.      A mild stenosis was found at the pylorus. This was traversed. Impression:               - Large hiatal hernia.                           - Non-bleeding esophageal ulcer.                           -  Non-bleeding gastric ulcer with no stigmata of                            bleeding. Biopsied.                           - Retained gastric fluid.                           - Normal duodenal bulb, first portion of the                            duodenum and second portion of the duodenum.                           - Gastric stenosis was found at the pylorus. Moderate Sedation:      Moderate (conscious) sedation was personally administered by an       anesthesia professional. The following parameters were monitored: oxygen       saturation, heart rate, blood pressure, and response to  care. Recommendation:           - Return patient to hospital ward for ongoing care.                           - Clear liquid diet.                           - Await pathology results.                           - Repeat upper endoscopy in 8 weeks to check                            healing.                           - Return to my office in 6 weeks.                           - Continue present medications.                           - No aspirin, ibuprofen, naproxen, or other                            non-steroidal anti-inflammatory drugs. Procedure Code(s):        --- Professional ---                           [id number], Esophagogastroduodenoscopy, flexible,                            transoral; with biopsy, single or multiple Diagnosis Code(s):        --- Professional ---                           K22.10, Ulcer of esophagus without  bleeding                           K25.9, Gastric ulcer, unspecified as acute or                            chronic, without hemorrhage or perforation                           K31.1, Adult hypertrophic pyloric stenosis                           K92.0, Hematemesis CPT copyright 2016 [employer]. All rights reserved. The codes documented in this report are preliminary and upon coder review may  be revised to meet current compliance requirements. Kathi Der, MD Kathi Der, MD 02/24/2016 11:30:00 AM Number of Addenda: 0

## 2016-02-24 NOTE — Brief Op Note (Addendum)
02/23/2016 - 02/24/2016  11:30 AM  PATIENT:  Tracy [name]  82 y.o. female  PRE-OPERATIVE DIAGNOSIS:  ugi bleed  POST-OPERATIVE DIAGNOSIS:  gastric ulcer; GEJ ulceration  PROCEDURE:  Procedure(s): ESOPHAGOGASTRODUODENOSCOPY (EGD) WITH PROPOFOL (N/A)  SURGEON:  Surgeon(s) and Role:    * Kathi DerParag Taylor Spilde, MD - Primary  Recommendations ------------------------ - IV  twice a day PPI while in the hospital. -  liquid diet today, advance diet as tolerated from tomorrow - Patient with large GE junction ulcer clean base as well as clean base gastric fundus ulcer without any active bleeding. - She will need a twice a day PPI for at least 8 weeks with repeat endoscopy in 8 weeks to document healing. - She needs to avoid NSAIDs. - GI will sign off. Call us back if needed.

## 2016-02-24 NOTE — Progress Notes (Signed)
Pt ambulated to the nursing station and back, pt did very well with walker and 2L O2. Pt is still pleasantly confused but able to be re orientated.

## 2016-02-24 NOTE — Transfer of Care (Signed)
Immediate Anesthesia Transfer of Care Note  Patient: Tracy [name]  Procedure(s) Performed: Procedure(s): ESOPHAGOGASTRODUODENOSCOPY (EGD) WITH PROPOFOL (N/A)  Patient Location: Endoscopy Unit  Anesthesia Type:MAC  Level of Consciousness: awake  Airway & Oxygen Therapy: Patient Spontanous Breathing  Post-op Assessment: Report given to RN and Post -op Vital signs reviewed and stable  Post vital signs: Reviewed and stable  Last Vitals:  Vitals:   02/24/16 0835 02/24/16 1009  BP: 123/76 (!) 192/52  Pulse:  (!) 104  Resp:  (!) 29  Temp: 36.4 C 36.6 C    Last Pain:  Vitals:   02/24/16 1009  TempSrc: Oral  PainSc:          Complications: No apparent anesthesia complications

## 2016-02-24 NOTE — Progress Notes (Signed)
PROGRESS NOTE    Tracy [name]  ZOX:[id number] DOB: 1927-02-09 DOA: 02/23/2016 PCP: Oliver Barre, MD   Brief Narrative:  82 y.o. BF PMHx CVA,CAROTID STENOSIS, PVD, Atrial Fibrillation, Sinus node dysfunction , HTN, HLD, DM (diabetes mellitus), type 2, uncontrolled with complications,DIVERTICULOSIS, COLON,Adenomatous Colonic polyps, GI bleed, recent hospitalization for generalized weaknes felt to be due to symptomatic anemia, at which time she received 2 units of blood, and was to follow up with G.I. Of note, she also has a history of G.I. bleed back in  May 2016, at which time she was taken off Plavix, but has not been seen by gastroenterology.   The patient was on Eliquis until 2 days prior to admission, for atrial fibrillation, but these had been discontinued due to increased bruising. Today, she had 2 major episodes of hematemesis prior to admission, filling up a cup each time, bright red blood, with another  less intense episode on transport to the  ED. She has not had another episodes since admission. On presentation, she was very weak, but she denies any worsening  shortness of breath, or palpitations. She denies any chest pain. She denies any abdominal pain or cramping. She denies any diarrhea or, hematochezia. She has chronic lower extremity neuropathy, but there are no new musculoskeletal or neurological symptoms. No confusion has been noted. No fever chills or recent infections.   Subjective: 11/21 A/O 2 (does not know when, why), cooperative, sleepy, follows commands.   Assessment & Plan:   Active Problems:   Diabetes (HCC)   Hyperlipidemia   Essential hypertension   SICK SINUS SYNDROME   GERD   Disorder of bone and cartilage   History of colonic polyps   Sinus bradycardia   Peripheral vascular disease (HCC)   Fatigue   Altered sensation due to stroke (HCC)   Hematochezia   LGI bleed   Cerebral infarction due to thrombosis of right middle cerebral artery (HCC)  Weakness   Overactive bladder   Trigger point of right shoulder region   Irregular heart beats   General weakness   A-fib (HCC)   AKI (acute kidney injury) (HCC)   Hemoptysis   Hematemesis   Gastro Intestinal Bleed/Acute Blood Loss Anemia, -Most likely secondary to recent  Eliquis, (d/c's 2 days prior to admission due to increased bruising).  -On admission Hg=5.1, 11/20 received  2 units PRBC -EGD pending -Hold all anticoagulation, aspirin, NSAID  -Advance diet as tolerated  Chronic diastolic CHF/Cardiomyopathy -Transfuse for hemoglobin<8 -11/21 transfuse one unit PRBC -Strict in and out -Daily weight Filed Weights   02/23/16 0345 02/23/16 0918  Weight: 50.8 kg (112 lb) 45 kg (99 lb 3.3 oz)  -transfuse for hemoglobin<8 -Echocardiogram: Shows cardiomyopathy see results below -11/21 received 1 unit RBC  Atrial Fibrillation  -was on anticoagulation with Eliquis, now d/c due to above. Review of patient's chart shows that patient has had GI bleeding problems since at least May 2016 would not restart blood thinners upon discharge. Would restart full dose aspirin in 2 weeks and then discuss issue with her cardiologist  -Not right controlled. -DC amlodipine -Cardizem drip  Pulmonary Hypertension  -severe  Hyperlipidemia -Continue home statins  Type II Diabetes uncontrolled with complication (neuropathy) - Current blood sugar level is 193   Chronic kidney disease stage  3 ( baseline Cr ) Lab Results  Component Value Date   CREATININE 0.94 02/24/2016   CREATININE 1.19 (H) 02/23/2016   CREATININE 0.99 02/12/2016     Hypertension -Hold  home  anti-hypertensive medications for now in view of active GIB , and will continue to monitor   History of CVA,  -remote, no acute issues at this time  Deconditioning OT/PT       DVT prophylaxis: SCD Code Status: Full Family Communication: Spoke at length with family members Disposition Plan: Discharge next 24-48  hours   Consultants:  GI     Procedures/Significant Events:  11/20 received  2 units PRBC 11/21 received 1 unit RBC 11/21 Echocardiogram:Left ventricle: The cavity size was normal. There was mild focal   basal hypertrophy of the septum. Systolic function was normal.   The estimated ejection fraction was in the range of 55% to 60%.   Wall motion was normal; there were no regional wall motion   abnormalities. Doppler parameters are consistent with high   ventricular filling pressure. - Mitral valve: Calcified annulus. There was moderate   regurgitation. - Left atrium: The atrium was severely dilated. - Right atrium: The atrium was mildly dilated. - Pulmonary arteries: Systolic pressure was severely increased. PA   peak pressure: 61 mm Hg (S). 11/21 ZOX:[id number]:Large hiatal hernia. - Non-bleeding esophageal ulcer. - Non-bleeding gastric ulcer with no stigmata of bleeding. Biopsied. - Retained gastric fluid. - Normal duodenal bulb, first portion of the duodenum and second portion of the duodenum. - Gastric stenosis was found at the pylorus.  VENTILATOR SETTINGS:    Cultures   Antimicrobials:    Devices    LINES / TUBES:      Continuous Infusions: . sodium chloride 100 mL/hr at 02/23/16 2202  . sodium chloride       Objective: Vitals:   02/23/16 2030 02/24/16 0100 02/24/16 0400 02/24/16 0835  BP: 119/66 130/70 135/78 123/76  Pulse: 75 82 (!) 56   Resp: (!) 22 18 (!) 34   Temp: 98.1 F (36.7 C) 98 F (36.7 C) 97.4 F (36.3 C) 97.5 F (36.4 C)  TempSrc: Oral Oral Oral Oral  SpO2: 96% 99%    Weight:      Height:        Intake/Output Summary (Last 24 hours) at 02/24/16 0919 Last data filed at 02/24/16 [id number]  Gross per 24 hour  Intake           783.33 ml  Output             1225 ml  Net          -441.67 ml   Filed Weights   02/23/16 0345 02/23/16 0918  Weight: 50.8 kg (112 lb) 45 kg (99 lb 3.3 oz)    Examination:  General:A/O 2 (does not know  when, why), No acute respiratory distress Eyes: negative scleral hemorrhage, negative anisocoria, negative icterus ENT: Negative Runny nose, negative gingival bleeding, Neck:  Negative scars, masses, torticollis, lymphadenopathy, JVD Lungs: Clear to auscultation bilaterally without wheezes or crackles Cardiovascular: Irregular irregular rhythm and rate, Regular rate and rhythm without murmur gallop or rub normal S1 and S2 Abdomen: negative abdominal pain, nondistended, positive soft, bowel sounds, no rebound, no ascites, no appreciable mass Extremities: No significant cyanosis, clubbing, or edema bilateral lower extremities Skin: Negative rashes, lesions, ulcers Psychiatric:  Negative depression, negative anxiety, negative fatigue, negative mania  Central nervous system:  Cranial nerves II through XII intact, tongue/uvula midline, all extremities muscle strength 5/5, sensation intact throughout, negative dysarthria, negative expressive aphasia, negative receptive aphasia.  .     Data Reviewed: Care during the described time interval was provided by me .  I have  reviewed this patient's available data, including medical history, events of note, physical examination, and all test results as part of my evaluation. I have personally reviewed and interpreted all radiology studies.  CBC:  Recent Labs Lab 02/23/16 0345 02/23/16 0827 02/23/16 1451 02/24/16 0241  WBC 17.3* 18.7* 17.2* 15.2*  NEUTROABS 14.3*  --   --   --   HGB 5.1* 6.5* 7.8* 7.7*  HCT 16.9* 20.7* 24.0* 23.8*  MCV 91.8 92.8 90.6 91.2  PLT 490* 443* 385 360   Basic Metabolic Panel:  Recent Labs Lab 02/23/16 0345 02/24/16 0241  NA 140 144  K 3.8 4.1  CL 105 113*  CO2 22 23  GLUCOSE 193* 88  BUN 26* 28*  CREATININE 1.19* 0.94  CALCIUM 9.5 9.1   GFR: Estimated Creatinine Clearance: 29.3 mL/min (by C-G formula based on SCr of 0.94 mg/dL). Liver Function Tests:  Recent Labs Lab 02/24/16 0241  AST 39  ALT 23   ALKPHOS 30*  BILITOT 0.5  PROT 5.1*  ALBUMIN 2.2*   No results for input(s): LIPASE, AMYLASE in the last 168 hours. No results for input(s): AMMONIA in the last 168 hours. Coagulation Profile:  Recent Labs Lab 02/23/16 0735 02/24/16 0241  INR 1.41 1.31   Cardiac Enzymes:  Recent Labs Lab 02/23/16 0823 02/23/16 1451 02/23/16 1912  TROPONINI 0.03* 0.11* 0.16*   BNP (last 3 results) No results for input(s): PROBNP in the last 8760 hours. HbA1C:  Recent Labs  02/23/16 0823  HGBA1C 5.2   CBG:  Recent Labs Lab 02/23/16 1740 02/23/16 2156 02/24/16 0301 02/24/16 0605 02/24/16 0842  GLUCAP 102* 76 74 70 143*   Lipid Profile: No results for input(s): CHOL, HDL, LDLCALC, TRIG, CHOLHDL, LDLDIRECT in the last 72 hours. Thyroid Function Tests: No results for input(s): TSH, T4TOTAL, FREET4, T3FREE, THYROIDAB in the last 72 hours. Anemia Panel: No results for input(s): VITAMINB12, FOLATE, FERRITIN, TIBC, IRON, RETICCTPCT in the last 72 hours. Urine analysis:    Component Value Date/Time   COLORURINE YELLOW 02/23/2016 2031   APPEARANCEUR CLEAR 02/23/2016 2031   LABSPEC 1.017 02/23/2016 2031   PHURINE 6.5 02/23/2016 2031   GLUCOSEU NEGATIVE 02/23/2016 2031   GLUCOSEU NEGATIVE 02/13/2016 1201   HGBUR NEGATIVE 02/23/2016 2031   BILIRUBINUR NEGATIVE 02/23/2016 2031   KETONESUR NEGATIVE 02/23/2016 2031   PROTEINUR NEGATIVE 02/23/2016 2031   UROBILINOGEN 0.2 02/13/2016 1201   NITRITE NEGATIVE 02/23/2016 2031   LEUKOCYTESUR TRACE (A) 02/23/2016 2031   Sepsis Labs: @LABRCNTIP (procalcitonin:4,lacticidven:4)  ) Recent Results (from the past 240 hour(s))  MRSA PCR Screening     Status: None   Collection Time: 02/23/16  9:32 AM  Result Value Ref Range Status   MRSA by PCR NEGATIVE NEGATIVE Final    Comment:        The GeneXpert MRSA Assay (FDA approved for NASAL specimens only), is one component of a comprehensive MRSA colonization surveillance program. It is  not intended to diagnose MRSA infection nor to guide or monitor treatment for MRSA infections.          Radiology Studies: Dg Chest Port 1 View  Result Date: 02/23/2016 CLINICAL DATA:  Possible GI bleeding. EXAM: PORTABLE CHEST 1 VIEW COMPARISON:  02/04/2016 FINDINGS: Cardiomediastinal silhouette is stably enlarged. Mediastinal contours appear intact. Tortuosity and atherosclerotic calcifications of the aorta are seen. There is no evidence of focal airspace consolidation, pleural effusion or pneumothorax. Stable hyperdense pleural plaques are seen. There is improvement in the degree of hyperinflation noted previously. Mixed  density lesion within the right proximal humerus is stable. Osteoarthritic changes of bilateral glenohumeral joints are noted. Osseous structures are otherwise without acute abnormality. Soft tissues are grossly normal. IMPRESSION: No evidence of acute pulmonary infiltrate or pleural effusions. Decreased hyperinflation of the lungs. Stable cardiomegaly. Electronically Signed   By: Ted Mcalpine M.D.   On: 02/23/2016 09:13        Scheduled Meds: . amLODipine  10 mg Oral Daily  . atorvastatin  5 mg Oral q1800  . cefTRIAXone (ROCEPHIN)  IV  2 g Intravenous Q24H  . gabapentin  200 mg Oral TID  . insulin aspart  0-9 Units Subcutaneous TID WC  . oxybutynin  5 mg Oral Daily  . pantoprazole (PROTONIX) IV  40 mg Intravenous Q24H  . pentoxifylline  400 mg Oral Daily  . sodium chloride flush  3 mL Intravenous Q12H   Continuous Infusions: . sodium chloride 100 mL/hr at 02/23/16 2202  . sodium chloride       LOS: 1 day    Time spent: 40 minutes    WOODS, Roselind Messier, MD [facility] Pager [phone]   If 7PM-7AM, please contact night-coverage [email or web address] Password [facility] 02/24/2016, 9:19 AM

## 2016-02-24 NOTE — Anesthesia Preprocedure Evaluation (Signed)
Anesthesia Evaluation  Patient identified by MRN, date of birth, ID band Patient awake    Reviewed: Allergy & Precautions, NPO status , Patient's Chart, lab work & pertinent test results  History of Anesthesia Complications Negative for: history of anesthetic complications  Airway Mallampati: II  TM Distance: >3 FB Neck ROM: Full    Dental  (+) Edentulous Upper, Edentulous Lower   Pulmonary neg shortness of breath, neg sleep apnea, neg COPD, neg recent URI, former smoker,    breath sounds clear to auscultation       Cardiovascular hypertension, Pt. on medications + Peripheral Vascular Disease   Rhythm:Regular     Neuro/Psych  Neuromuscular disease CVA negative psych ROS   GI/Hepatic Neg liver ROS, GERD  ,  Endo/Other  diabetes, Type 2, Insulin Dependent  Renal/GU Renal InsufficiencyRenal disease     Musculoskeletal   Abdominal   Peds  Hematology  (+) anemia ,   Anesthesia Other Findings   Reproductive/Obstetrics                             Anesthesia Physical Anesthesia Plan  ASA: III  Anesthesia Plan: MAC   Post-op Pain Management:    Induction: Intravenous  Airway Management Planned: Nasal Cannula, Natural Airway and Simple Face Mask  Additional Equipment: None  Intra-op Plan:   Post-operative Plan:   Informed Consent: I have reviewed the patients History and Physical, chart, labs and discussed the procedure including the risks, benefits and alternatives for the proposed anesthesia with the patient or authorized representative who has indicated his/her understanding and acceptance.   Dental advisory given  Plan Discussed with: CRNA and Surgeon  Anesthesia Plan Comments:         Anesthesia Quick Evaluation

## 2016-02-24 NOTE — Progress Notes (Signed)
  Echocardiogram 2D Echocardiogram has been performed.  Leta JunglingCooper, Leo Fray M 02/24/2016, 2:04 PM

## 2016-02-25 DIAGNOSIS — N179 Acute kidney failure, unspecified: Secondary | ICD-10-CM

## 2016-02-25 LAB — CBC WITH DIFFERENTIAL/PLATELET
BASOS ABS: 0 10*3/uL (ref 0.0–0.1)
BASOS PCT: 0 %
EOS ABS: 0.1 10*3/uL (ref 0.0–0.7)
Eosinophils Relative: 1 %
HEMATOCRIT: 33.2 % — AB (ref 36.0–46.0)
HEMOGLOBIN: 10.7 g/dL — AB (ref 12.0–15.0)
Lymphocytes Relative: 5 %
Lymphs Abs: 0.7 10*3/uL (ref 0.7–4.0)
MCH: 29.8 pg (ref 26.0–34.0)
MCHC: 32.2 g/dL (ref 30.0–36.0)
MCV: 92.5 fL (ref 78.0–100.0)
Monocytes Absolute: 0.8 10*3/uL (ref 0.1–1.0)
Monocytes Relative: 5 %
NEUTROS ABS: 13.4 10*3/uL — AB (ref 1.7–7.7)
NEUTROS PCT: 89 %
Platelets: 356 10*3/uL (ref 150–400)
RBC: 3.59 MIL/uL — AB (ref 3.87–5.11)
RDW: 16.6 % — ABNORMAL HIGH (ref 11.5–15.5)
WBC: 15 10*3/uL — AB (ref 4.0–10.5)

## 2016-02-25 LAB — BASIC METABOLIC PANEL
ANION GAP: 8 (ref 5–15)
BUN: 15 mg/dL (ref 6–20)
CALCIUM: 8.8 mg/dL — AB (ref 8.9–10.3)
CO2: 24 mmol/L (ref 22–32)
Chloride: 111 mmol/L (ref 101–111)
Creatinine, Ser: 0.78 mg/dL (ref 0.44–1.00)
Glucose, Bld: 90 mg/dL (ref 65–99)
POTASSIUM: 3.4 mmol/L — AB (ref 3.5–5.1)
Sodium: 143 mmol/L (ref 135–145)

## 2016-02-25 LAB — TYPE AND SCREEN
ABO/RH(D): O NEG
ANTIBODY SCREEN: NEGATIVE
UNIT DIVISION: 0
UNIT DIVISION: 0
UNIT DIVISION: 0
Unit division: 0
Unit division: 0
Unit division: 0
Unit division: 0

## 2016-02-25 LAB — LIPID PANEL
CHOL/HDL RATIO: 3.2 ratio
CHOLESTEROL: 70 mg/dL (ref 0–200)
HDL: 22 mg/dL — AB (ref >40)
LDL Cholesterol: 23 mg/dL (ref 0–99)
TRIGLYCERIDES: 126 mg/dL (ref <150)
VLDL: 25 mg/dL (ref 0–40)

## 2016-02-25 LAB — GLUCOSE, CAPILLARY
GLUCOSE-CAPILLARY: 78 mg/dL (ref 65–99)
GLUCOSE-CAPILLARY: 91 mg/dL (ref 65–99)
GLUCOSE-CAPILLARY: 74 mg/dL (ref 65–99)
Glucose-Capillary: 78 mg/dL (ref 65–99)

## 2016-02-25 LAB — URINE CULTURE: Culture: NO GROWTH

## 2016-02-25 LAB — MAGNESIUM: MAGNESIUM: 2 mg/dL (ref 1.7–2.4)

## 2016-02-25 MED ORDER — PROSIGHT PO TABS
1.0000 | ORAL_TABLET | Freq: Two times a day (BID) | ORAL | Status: DC
  Administered 2016-02-25 – 2016-02-26 (×2): 1 via ORAL
  Filled 2016-02-25 (×2): qty 1

## 2016-02-25 MED ORDER — PANTOPRAZOLE SODIUM 40 MG PO TBEC
40.0000 mg | DELAYED_RELEASE_TABLET | Freq: Two times a day (BID) | ORAL | Status: DC
  Administered 2016-02-26: 40 mg via ORAL
  Filled 2016-02-25: qty 1

## 2016-02-25 MED ORDER — DILTIAZEM HCL 30 MG PO TABS
30.0000 mg | ORAL_TABLET | Freq: Four times a day (QID) | ORAL | Status: AC
  Administered 2016-02-25 (×2): 30 mg via ORAL
  Filled 2016-02-25 (×3): qty 1

## 2016-02-25 MED ORDER — ADULT MULTIVITAMIN W/MINERALS CH
1.0000 | ORAL_TABLET | Freq: Every day | ORAL | Status: DC
  Administered 2016-02-25 – 2016-02-26 (×2): 1 via ORAL
  Filled 2016-02-25: qty 1

## 2016-02-25 MED ORDER — ENSURE ENLIVE PO LIQD
237.0000 mL | Freq: Two times a day (BID) | ORAL | Status: DC
  Administered 2016-02-26: 237 mL via ORAL

## 2016-02-25 MED ORDER — POTASSIUM CHLORIDE CRYS ER 20 MEQ PO TBCR
40.0000 meq | EXTENDED_RELEASE_TABLET | Freq: Once | ORAL | Status: AC
  Administered 2016-02-25: 40 meq via ORAL
  Filled 2016-02-25: qty 2

## 2016-02-25 MED ORDER — DILTIAZEM HCL ER COATED BEADS 120 MG PO CP24: 120.0000 mg | ORAL_CAPSULE | Freq: Every day | ORAL | Status: DC

## 2016-02-25 MED ORDER — CALCIUM CARBONATE 1250 (500 CA) MG PO TABS
1.0000 | ORAL_TABLET | Freq: Two times a day (BID) | ORAL | Status: DC
  Administered 2016-02-25 – 2016-02-26 (×2): 500 mg via ORAL
  Filled 2016-02-25: qty 1

## 2016-02-25 NOTE — Evaluation (Signed)
Physical Therapy Evaluation Patient Details Name: Tracy [name] MRN: [id number] DOB: Aug 27, 1926 Today's Date: 02/25/2016   History of Present Illness  Pt adm with UGI bleed. PMH - HTN, afib, DM, recent fall with rt hip pain  Clinical Impression  Pt admitted with above diagnosis and presents to PT with functional limitations due to deficits listed below (See PT problem list). Pt needs skilled PT to maximize independence and safety to allow discharge to home with family. Pt has very supportive family that can assist her at home and pt can resume her HHPT.     Follow Up Recommendations Home health PT;Supervision/Assistance - 24 hour    Equipment Recommendations  None recommended by PT    Recommendations for Other Services       Precautions / Restrictions Precautions Precautions: Fall Restrictions Weight Bearing Restrictions: No      Mobility  Bed Mobility Overal bed mobility: Needs Assistance Bed Mobility: Supine to Sit     Supine to sit: Min assist     General bed mobility comments: Assist to elevate trunk into sitting  Transfers Overall transfer level: Needs assistance Equipment used: 4-wheeled walker Transfers: Sit to/from Stand Sit to Stand: Min assist         General transfer comment: Assist for balance  Ambulation/Gait Ambulation/Gait assistance: Min assist;Min guard Ambulation Distance (Feet): 100 Feet (x 2) Assistive device: 4-wheeled walker Gait Pattern/deviations: Step-through pattern;Decreased step length - right;Decreased step length - left;Drifts right/left Gait velocity: decr Gait velocity interpretation: <1.8 ft/sec, indicative of risk for recurrent falls General Gait Details: slightly unsteady with assist for balance  Stairs            Wheelchair Mobility    Modified Rankin (Stroke Patients Only)       Balance Overall balance assessment: Needs assistance Sitting-balance support: No upper extremity supported;Feet  supported Sitting balance-Leahy Scale: Good     Standing balance support: Bilateral upper extremity supported Standing balance-Leahy Scale: Poor Standing balance comment: rollator and min guard for static standing                             Pertinent Vitals/Pain Pain Assessment: Faces Faces Pain Scale: Hurts little more Pain Location: rt hip Pain Descriptors / Indicators: Guarding Pain Intervention(s): Limited activity within patient's tolerance;Monitored during session    Home Living Family/patient expects to be discharged to:: Private residence Living Arrangements: Children Available Help at Discharge: Family;Available 24 hours/day Type of Home: House Home Access: Level entry     Home Layout: One level Home Equipment: Bedside commode;Walker - 4 wheels;Walker - 2 wheels      Prior Function Level of Independence: Needs assistance   Gait / Transfers Assistance Needed: some assistance with gait since fall several weeks ago and rt hip pain           Hand Dominance   Dominant Hand: Right    Extremity/Trunk Assessment   Upper Extremity Assessment: Generalized weakness           Lower Extremity Assessment: Generalized weakness         Communication   Communication: No difficulties  Cognition Arousal/Alertness: Awake/alert Behavior During Therapy: WFL for tasks assessed/performed Overall Cognitive Status: History of cognitive impairments - at baseline                      General Comments      Exercises     Assessment/Plan  PT Assessment Patient needs continued PT services  PT Problem List Decreased strength;Decreased activity tolerance;Decreased balance;Decreased mobility;Decreased knowledge of use of DME          PT Treatment Interventions DME instruction;Gait training;Functional mobility training;Therapeutic activities;Therapeutic exercise;Balance training;Patient/family education    PT Goals (Current goals can be found  in the Care Plan section)  Acute Rehab PT Goals Patient Stated Goal: go home PT Goal Formulation: With patient/family Time For Goal Achievement: 03/03/16 Potential to Achieve Goals: Good    Frequency Min 3X/week   Barriers to discharge        Co-evaluation               End of Session   Activity Tolerance: Patient tolerated treatment well Patient left: in chair;with call bell/phone within reach;with chair alarm set;with family/visitor present Nurse Communication: Mobility status         Time: [id number] PT Time Calculation (min) (ACUTE ONLY): 28 min   Charges:   PT Evaluation $PT Eval Moderate Complexity: 1 Procedure PT Treatments $Gait Training: 8-22 mins   PT G CodesAngelina Ok:        [name] 02/25/2016, 4:57 PM [employer] Corporation[employer] Resha Filippone PT [phone]

## 2016-02-25 NOTE — Progress Notes (Signed)
[facility] TEAM 1 - Stepdown/ICU TEAM  [name]  [name]:[id number] DOB: 01/17/1927 DOA: 02/23/2016 PCP: Tracy BarreJames John, MD    Brief Narrative:  82 y.o.F Hx CVA,CAROTID STENOSIS, PVD, Atrial Fib, Sinus node dysfunction, HTN, HLD, DM2, DIVERTICULOSIS, Adenomatous Colonic polyps, GI bleed, and a recent hospitalization for generalized weaknes felt to be due to symptomatic anemia, at which time she received 2 units of blood, and was to follow up with G.I. Of note, she also has a history of G.I. bleed back in May 2016, at which time she was taken off Plavix.   The patient was on Eliquis until 2 days prior to admission, for atrial fibrillation, but these had been discontinued due to increased bruising. Today, she had 2 major episodes of hematemesis prior to admission, filling up a cup each time, bright red blood, with another less intense episode on transport to the ED. She has not had another episodes since admission.   Subjective: The patient is sitting comfortably in a bedside chair.  She has been up walking the hall without difficulty.  She denies chest pain shortness breath fevers chills nausea or vomiting.  She has not yet tried a regular diet.  She remains on a Cardizem drip.  Assessment & Plan:  GIB due to large GE junction ulcer and gastric fundus ulcer  -BID PPI for 8 weeks - repeat EGD in 8 weeks - to f/u in GI office in 6 weeks - avoid NSAIDs -Most likely related to recent Eliquis, (d/c's 2 days prior to admission due to increased bruising)  Acute blood loss anemia -Hemoglobin appears stable status post 3 units packed red blood cells this admission  Chronic diastolic CHF -No clinical evidence of volume overload at this time [facility]   02/23/16 0918 02/24/16 2014 02/25/16 0600  Weight: 45 kg (99 lb 3.3 oz) 46.4 kg (102 lb 4.7 oz) 46.9 kg (103 lb 6.3 oz)    Atrial Fibrillation  -was on anticoagulation with Eliquis > d/c due to above - has been having intermitttent GI  bleeding problems since at least May 2016 - will not restart blood thinners - consider full dose aspirin in 2 weeks - rate well controlled on Cardizem drip - discontinue Cardizem drip and follow overnight on telemetry  Severe Pulmonary Hypertension   Hyperlipidemia -Continue home medication  Type II Diabetes -Well controlled  Chronic kidney disease stage 3 -stable / crt better than her baseline   Recent Labs Lab 02/23/16 0345 02/24/16 0241 02/25/16 0259  CREATININE 1.19* 0.94 0.78    Hypertension -Blood pressure currently well controlled  Remote History of CVA -no acute issues at this time  Deconditioning OT/PT - lives with 2 supportive sons  DVT prophylaxis: SCDs Code Status: FULL CODE Family Communication: Spoke with sons at bedside  Disposition Plan: Discharge home in a.m. if heart rate stable off Cardizem drip and hemoglobin stable and tolerates regular diet  Consultants:  GI  Procedures: 11/21 TTE EF 55-60 % with no regional wall motion abnormalities, moderate MR, mild TR, severely elevated pulmonary pressure (61 mmHg), biatrial enlargement 11/21 EGD Large hiatal hernia - Non-bleeding esophageal ulcer - Non-bleeding gastric ulcer with no stigmata of bleeding. Biopsied - Retained gastric fluid - Normal duodenal bulb, first portion of the duodenum and second portion of the duodenum - Gastric stenosis was found at the pylorus.  Antimicrobials:  Ceftriaxone 11/20 > 11/22  Objective: Blood pressure 120/81, pulse 100, temperature 97.8 F (36.6 C), temperature source Oral, resp. rate (!) 21,  height 4\' 11"  (1.499 m), weight 46.9 kg (103 lb 6.3 oz), SpO2 97 %.  Intake/Output Summary (Last 24 hours) at 02/25/16 1613 Last data filed at 02/25/16 1600  Gross per 24 hour  Intake          1559.16 ml  Output              450 ml  Net          1109.16 ml   Filed Weights   02/23/16 0918 02/24/16 2014 02/25/16 0600  Weight: 45 kg (99 lb 3.3 oz) 46.4 kg (102 lb 4.7  oz) 46.9 kg (103 lb 6.3 oz)    Examination: General: No acute respiratory distress Lungs: Clear to auscultation bilaterally without wheezes or crackles Cardiovascular: Regular rate without murmur gallop or rub normal S1 and S2 Abdomen: Nontender, nondistended, soft, bowel sounds positive, no rebound, no ascites, no appreciable mass Extremities: No significant cyanosis, clubbing, or edema bilateral lower extremities  CBC:  Recent Labs Lab 02/23/16 0345 02/23/16 0827 02/23/16 1451 02/24/16 0241 02/24/16 2213 02/25/16 0259  WBC 17.3* 18.7* 17.2* 15.2*  --  15.0*  NEUTROABS 14.3*  --   --   --   --  13.4*  HGB 5.1* 6.5* 7.8* 7.7* 10.3* 10.7*  HCT 16.9* 20.7* 24.0* 23.8* 32.2* 33.2*  MCV 91.8 92.8 90.6 91.2  --  92.5  PLT 490* 443* 385 360  --  356   Basic Metabolic Panel:  Recent Labs Lab 02/23/16 0345 02/24/16 0241 02/25/16 0259  NA 140 144 143  K 3.8 4.1 3.4*  CL 105 113* 111  CO2 22 23 24   GLUCOSE 193* 88 90  BUN 26* 28* 15  CREATININE 1.19* 0.94 0.78  CALCIUM 9.5 9.1 8.8*  MG  --   --  2.0   GFR: Estimated Creatinine Clearance: 34.4 mL/min (by C-G formula based on SCr of 0.78 mg/dL).  Liver Function Tests:  Recent Labs Lab 02/24/16 0241  AST 39  ALT 23  ALKPHOS 30*  BILITOT 0.5  PROT 5.1*  ALBUMIN 2.2*    Coagulation Profile:  Recent Labs Lab 02/23/16 0735 02/24/16 0241  INR 1.41 1.31    Cardiac Enzymes:  Recent Labs Lab 02/23/16 0823 02/23/16 1451 02/23/16 1912  TROPONINI 0.03* 0.11* 0.16*    HbA1C: Hgb A1c MFr Bld  Date/Time Value Ref Range Status  02/23/2016 08:23 AM 5.2 4.8 - 5.6 % Final    Comment:    (NOTE)         Pre-diabetes: 5.7 - 6.4         Diabetes: >6.4         Glycemic control for adults with diabetes: <7.0   05/27/2015 01:51 PM 5.7 4.6 - 6.5 % Final    Comment:    Glycemic Control Guidelines for People with Diabetes:Non Diabetic:  <6%Goal of Therapy: <7%Additional Action Suggested:  >8%     CBG:  Recent  Labs Lab 02/24/16 1256 02/24/16 1642 02/24/16 2212 02/25/16 0838 02/25/16 1214  GLUCAP 78 78 92 78 78    Recent Results (from the past 240 hour(s))  MRSA PCR Screening     Status: None   Collection Time: 02/23/16  9:32 AM  Result Value Ref Range Status   MRSA by PCR NEGATIVE NEGATIVE Final    Comment:        The GeneXpert MRSA Assay (FDA approved for NASAL specimens only), is one component of a comprehensive MRSA colonization surveillance program. It is not intended to  diagnose MRSA infection nor to guide or monitor treatment for MRSA infections.   Culture, blood (Routine X 2) w Reflex to ID Panel     Status: None (Preliminary result)   Collection Time: 02/23/16 10:45 AM  Result Value Ref Range Status   Specimen Description BLOOD RIGHT ANTECUBITAL  Final   Special Requests BOTTLES DRAWN AEROBIC ONLY 4CC  Final   Culture NO GROWTH 2 DAYS  Final   Report Status PENDING  Incomplete  Culture, blood (Routine X 2) w Reflex to ID Panel     Status: None (Preliminary result)   Collection Time: 02/23/16 10:55 AM  Result Value Ref Range Status   Specimen Description BLOOD BLOOD RIGHT HAND  Final   Special Requests BOTTLES DRAWN AEROBIC ONLY 5CC  Final   Culture NO GROWTH 2 DAYS  Final   Report Status PENDING  Incomplete  Culture, Urine     Status: None   Collection Time: 02/23/16  8:32 PM  Result Value Ref Range Status   Specimen Description URINE, CLEAN CATCH  Final   Special Requests NONE  Final   Culture NO GROWTH  Final   Report Status 02/25/2016 FINAL  Final     Scheduled Meds: . sodium chloride   Intravenous Once  . atorvastatin  5 mg Oral q1800  . cefTRIAXone (ROCEPHIN)  IV  2 g Intravenous Q24H  . gabapentin  200 mg Oral TID  . insulin aspart  0-9 Units Subcutaneous TID WC  . oxybutynin  5 mg Oral Daily  . pantoprazole (PROTONIX) IV  40 mg Intravenous Q24H  . pentoxifylline  400 mg Oral Daily  . sodium chloride flush  3 mL Intravenous Q12H   Continuous  Infusions: . sodium chloride 50 mL/hr at 02/24/16 2137  . diltiazem (CARDIZEM) infusion 7.5 mg/hr (02/25/16 1609)     LOS: 2 days   Lonia Blood, MD [facility] Office  [phone] Pager - Text Page per Amion as per below:  On-Call/Text Page:      [email or web address]      password [email or web address]  If 7PM-7AM, please contact night-coverage [email or web address] Password [facility] 02/25/2016, 4:13 PM

## 2016-02-25 NOTE — Progress Notes (Signed)
Patient ambulating oxygen saturation on room air is 97%. Patient tolerated well, no signs or symptoms of distress. Will continue to monitor.

## 2016-02-26 LAB — CBC
HCT: 32.1 % — ABNORMAL LOW (ref 36.0–46.0)
HEMOGLOBIN: 10.1 g/dL — AB (ref 12.0–15.0)
MCH: 29.3 pg (ref 26.0–34.0)
MCHC: 31.5 g/dL (ref 30.0–36.0)
MCV: 93 fL (ref 78.0–100.0)
Platelets: 387 10*3/uL (ref 150–400)
RBC: 3.45 MIL/uL — AB (ref 3.87–5.11)
RDW: 17.6 % — ABNORMAL HIGH (ref 11.5–15.5)
WBC: 12.6 10*3/uL — AB (ref 4.0–10.5)

## 2016-02-26 LAB — BASIC METABOLIC PANEL
ANION GAP: 5 (ref 5–15)
BUN: 10 mg/dL (ref 6–20)
CHLORIDE: 109 mmol/L (ref 101–111)
CO2: 27 mmol/L (ref 22–32)
Calcium: 8.4 mg/dL — ABNORMAL LOW (ref 8.9–10.3)
Creatinine, Ser: 0.77 mg/dL (ref 0.44–1.00)
Glucose, Bld: 109 mg/dL — ABNORMAL HIGH (ref 65–99)
POTASSIUM: 4.4 mmol/L (ref 3.5–5.1)
SODIUM: 141 mmol/L (ref 135–145)

## 2016-02-26 LAB — GLUCOSE, CAPILLARY: Glucose-Capillary: 115 mg/dL — ABNORMAL HIGH (ref 65–99)

## 2016-02-26 LAB — TROPONIN I: Troponin I: 0.08 ng/mL (ref <0.03)

## 2016-02-26 MED ORDER — PANTOPRAZOLE SODIUM 40 MG PO TBEC: 40.0000 mg | DELAYED_RELEASE_TABLET | Freq: Two times a day (BID) | ORAL | 1 refills | Status: DC

## 2016-02-26 MED ORDER — DILTIAZEM HCL ER COATED BEADS 240 MG PO CP24: 240.0000 mg | ORAL_CAPSULE | Freq: Every day | ORAL | 0 refills | Status: DC

## 2016-02-26 MED ORDER — DILTIAZEM HCL ER COATED BEADS 180 MG PO CP24: 180.0000 mg | ORAL_CAPSULE | Freq: Every day | ORAL | 0 refills | Status: DC

## 2016-02-26 MED ORDER — DILTIAZEM HCL ER COATED BEADS 240 MG PO CP24
240.0000 mg | ORAL_CAPSULE | Freq: Every day | ORAL | Status: DC
  Administered 2016-02-26: 240 mg via ORAL
  Filled 2016-02-26: qty 1

## 2016-02-26 NOTE — Progress Notes (Signed)
CRITICAL VALUE ALERT  Critical value received:  Troponin 0.08  Date of notification:  02/26/2016  Time of notification:  4:38 AM   Critical value read back:Yes.    Nurse who received alert:  Cresenciano LickMikaela Kiyon Fidalgo   MD notified (1st page):  Mila PalmerKirpy, NP   Time of first page:  4:38 AM   MD notified (2nd page):  Time of second page:  Responding MD:  Craige CottaKirby, np  Time MD responded:  [phone]

## 2016-02-26 NOTE — Discharge Instructions (Signed)
To Whom it may concern,  [name] has no medical contraindication to a steroid or sodium channel blocker (marcaine, etc.) joint injection if this is felt to be indicated for her chronic pain.  She has been instructed to avoid NSAIDs.    Lonia BloodJeffrey T. Nonnie Pickney, MD [facility] Office  [phone] 02/26/2016, 9:17 AM    Peptic Ulcer  Avoid the use of NSAIDs (Advil, Aleve, ibuprofen, Motrin, Naprosyn, aspirin, etc.) - you may use Tylenol / acetaminophen for mild pain and low-grade fever.  A peptic ulcer is a sore in the lining of the esophagus (esophageal ulcer), the stomach (gastric ulcer), or the first part of the small intestine (duodenal ulcer). The ulcer causes gradual wearing away (erosion) into the deeper tissue. What are the causes? Normally, the lining of the stomach and the small intestine protects itself from the acid that digests food. The protective lining can be damaged by:  An infection caused by a germ (bacterium) called Helicobacter pylori or H. pylori.  Regular use of NSAIDs, such as ibuprofen or aspirin.  Rare tumors in the stomach, small intestine, or pancreas (Zollinger-Ellison syndrome). What increases the risk? The following factors may make you more likely to develop this condition:  Smoking.  Having a family history of ulcer disease. What are the signs or symptoms? Symptoms of this condition include:  Burning pain or gnawing in the area between the chest and the belly button. The pain may be worse on an empty stomach and at night.  Heartburn.  Nausea and vomiting.  Bloating. If the ulcer results in bleeding, it can cause:  Black, tarry stools.  Vomiting of bright red blood.  Vomiting of material that looks like coffee grounds. How is this diagnosed? This condition may be diagnosed based on:  Medical history and physical exam.  Various tests or procedures, such as:  Blood tests, stool tests, or breath tests to check for the H. pylori  bacterium.  An X-ray exam (upper gastrointestinal series) of the esophagus, stomach, and small intestine.  Upper endoscopy. The health care provider examines the esophagus, stomach, and small intestine using a small flexible tube that has a video camera at the end.  Biopsy. A tissue sample is removed to be examined under a microscope. How is this treated? Treatment for this condition may include:  Eliminating the cause of the ulcer, such as smoking or the use of NSAIDs or alcohol.  Medicines to reduce the amount of acid in your digestive tract.  Antibiotic medicines, if the ulcer is caused by the H. pylori bacterium.  An upper endoscopy to treat a bleeding ulcer.  Surgery, if the bleeding is severe or if the ulcer created a hole somewhere in the digestive system. Follow these instructions at home:  Avoid alcohol and caffeine.  Do not use any tobacco products, such as cigarettes, chewing tobacco, and e-cigarettes. If you need help quitting, ask your health care provider.  Take over-the-counter and prescription medicines only as told by your health care provider. Do not use over-the-counter medicines in place of prescription medicines unless your health care provider approves.  Keep all follow-up visits as told by your health care provider. This is important. Contact a health care provider if:  Your symptoms do not improve within 7 days of starting treatment.  You have ongoing indigestion or heartburn. Get help right away if:  You have sudden, sharp, or persistent pain in your abdomen.  You have bloody or dark black, tarry stools.  You vomit blood  or material that looks like coffee grounds.  You become light-headed or you feel faint.  You become weak.  You become sweaty or clammy. This information is not intended to replace advice given to you by your health care provider. Make sure you discuss any questions you have with your health care provider. Document Released:  03/19/2000 Document Revised: 08/25/2015 Document Reviewed: 12/21/2014 Elsevier Interactive Patient Education  2017 [employer]

## 2016-02-26 NOTE — Progress Notes (Signed)
Patient for dishcarge. Medications and discharge instructions explained to the patient's son Tracy [name](Alonzo). He verbalized understanding. Copies given to him including original prescriptions. IV saline lock and telemetry removed. Monitor tech notified

## 2016-02-26 NOTE — Discharge Summary (Addendum)
DISCHARGE SUMMARY  Tracy [name]  MR#: [id number]  DOB:1927/03/18  Date of Admission: 02/23/2016 Date of Discharge: 02/26/2016  Attending Physician:Aniesa Boback T  Patient's ION:GEXBM Jonny Ruiz, MD  Consults: [facility]  Disposition: D/C home w/ sons  Follow-up Appts: Follow-up Information    Oliver Barre, MD Follow up in 5 day(s).   Specialties:  Internal Medicine, Radiology Contact information: [address] Maggie Schwalbe [facility] [place] [address] [phone]        Ruffin Frederick, MD Follow up on 04/16/2016.   Specialty:  Gastroenterology Why:  at 2:30PM Contact information: [address] [place] [address] [phone]           Tests Needing Follow-up: -recheck Hgb in 5-7 days -f/u of her HR control in the setting of Afib w/ new start on Cardizem CD -GI suggests repeat endoscopy in 8 weeks to document healing of ulcers  -consider full doseaspirin for stroke prophylaxis in atrial fibrillation in 2 weeks  Discharge Diagnoses: GIB due to large GE junction ulcer and gastric fundus ulcer (gastric ulcer w/ hemorrhage) Acute blood loss anemia Chronic diastolic CHF Atrial Fibrillation  Severe Pulmonary Hypertension Hyperlipidemia Type II Diabetes Chronic kidney disease stage 3 Hypertension Remote History of CVA Deconditioning   Initial presentation: 82 y.o.F Hx CVA,CAROTID STENOSIS, PVD,Atrial Fib, Sinus node dysfunction, HTN, HLD, DM2, DIVERTICULOSIS, Adenomatous Colonic polyps, GI bleed,and a recent hospitalization for generalized weaknes felt to be due to symptomatic anemia at which time she received 2 units of blood,andwas to follow up with GI.Of note, she also has a history of G.I. bleed back in May 2016, at which time she was taken off Plavix.  The patient was onEliquisuntil 2 days prior to admission, for atrial fibrillation, but this had been discontinued due to increased bruising. On the day of her admission she had 2 major  episodes of hematemesis, filling up a cup each time, bright red blood, with another less intense episode on transport to the ED.   Hospital Course:  GIB due to large GE junction ulcer and gastric fundus ulcer  -EGD during hospital stay noted above findings  -BID PPI for 8 weeks - repeat EGD in 8 weeks - to f/u in GI office in 6 weeks - avoid NSAIDs (pt and son counseled) -Most likely related to recent Eliquis(d/c'd 2 days prior to admission due to increased bruising) - patient advised to discontinue Eliquis   Acute blood loss anemia -Hemoglobin appears stable status post 3 units packed red blood cells this admission  Chronic diastolic CHF -No clinical evidence of volume overload at this time  Atrial Fibrillation  -was on anticoagulation with Eliquis > d/c due to above - has been having intermitttent GI bleeding problems since at least May 2016 - will notrestart blood thinners - consider full doseaspirin in 2 weeks - rate well controlled on Cardizem drip - discontinue Cardizem drip and follow overnight on telemetry  Severe Pulmonary Hypertension  Hyperlipidemia -Continue home medication  Type II Diabetes -Well controlled with A1c 5.2  Chronic kidney disease stage 3 -stable / crt better than her baseline   Hypertension -Blood pressure well controlled  Remote History of CVA -no acute issues at this time  Deconditioning OT/PT - lives with 2 supportive sons    Medication List    STOP taking these medications   amLODipine 5 MG tablet Commonly known as:  NORVASC   predniSONE 20 MG tablet Commonly known as:  DELTASONE     TAKE these medications  atorvastatin 10 MG tablet Commonly known as:  LIPITOR 1/2 tab by mouth daily What changed:  how much to take  how to take this  when to take this  additional instructions   CALTRATE 600 1500 (600 Ca) MG Tabs tablet Generic drug:  calcium carbonate Take 1 tablet by mouth 2 (two) times daily.   diltiazem  180 MG 24 hr capsule Commonly known as:  CARDIZEM CD Take 1 capsule (180 mg total) by mouth daily.   feeding supplement (ENSURE ENLIVE) Liqd Take 237 mLs by mouth 2 (two) times daily between meals.   fenofibrate micronized 134 MG capsule Commonly known as:  LOFIBRA TAKE 1 CAPSULE (134 MG TOTAL) BY MOUTH DAILY.   gabapentin 100 MG capsule Commonly known as:  NEURONTIN Take 2 capsules (200 mg total) by mouth 3 (three) times daily.   HYDROcodone-acetaminophen 5-325 MG tablet Commonly known as:  NORCO/VICODIN Take 1 tablet by mouth every 6 (six) hours as needed for moderate pain.   multivitamin capsule Take 1 capsule by mouth daily. Centrum Silver   oxybutynin 5 MG 24 hr tablet Commonly known as:  DITROPAN-XL TAKE 1 TABLET BY MOUTH EVERY DAY   pantoprazole 40 MG tablet Commonly known as:  PROTONIX Take 1 tablet (40 mg total) by mouth 2 (two) times daily before a meal.   pentoxifylline 400 MG CR tablet Commonly known as:  TRENTAL TAKE 1 TABLET (400 MG TOTAL) BY MOUTH DAILY.   PRESERVISION AREDS 2 Caps Take 1 capsule by mouth 2 (two) times daily.       Day of Discharge BP 105/68 (BP Location: Left Arm)   Pulse (!) 121   Temp 97.4 F (36.3 C) (Oral)   Resp 17   Ht 4\' 11"  (1.499 m)   Wt 48.4 kg (106 lb 11.2 oz)   SpO2 92%   BMI 21.55 kg/m   Physical Exam: General: No acute respiratory distress Lungs: Clear to auscultation bilaterally without wheezes or crackles Cardiovascular: Mild tachycardia at 120 bpm without murmur (has not yet received her long acting Cardizem this morning) Abdomen: Nontender, nondistended, soft, bowel sounds positive, no rebound, no ascites, no appreciable mass Extremities: No significant cyanosis, clubbing, or edema bilateral lower extremities  Basic Metabolic Panel:  Recent Labs Lab 02/23/16 0345 02/24/16 0241 02/25/16 0259 02/26/16 0255  NA 140 144 143 141  K 3.8 4.1 3.4* 4.4  CL 105 113* 111 109  CO2 22 23 24 27   GLUCOSE 193*  88 90 109*  BUN 26* 28* 15 10  CREATININE 1.19* 0.94 0.78 0.77  CALCIUM 9.5 9.1 8.8* 8.4*  MG  --   --  2.0  --     Liver Function Tests:  Recent Labs Lab 02/24/16 0241  AST 39  ALT 23  ALKPHOS 30*  BILITOT 0.5  PROT 5.1*  ALBUMIN 2.2*   Coags:  Recent Labs Lab 02/23/16 0735 02/24/16 0241  INR 1.41 1.31   CBC:  Recent Labs Lab 02/23/16 0345 02/23/16 0827 02/23/16 1451 02/24/16 0241 02/24/16 2213 02/25/16 0259 02/26/16 0255  WBC 17.3* 18.7* 17.2* 15.2*  --  15.0* 12.6*  NEUTROABS 14.3*  --   --   --   --  13.4*  --   HGB 5.1* 6.5* 7.8* 7.7* 10.3* 10.7* 10.1*  HCT 16.9* 20.7* 24.0* 23.8* 32.2* 33.2* 32.1*  MCV 91.8 92.8 90.6 91.2  --  92.5 93.0  PLT 490* 443* 385 360  --  356 387    Cardiac Enzymes:  Recent  Labs Lab 02/23/16 0823 02/23/16 1451 02/23/16 1912 02/26/16 0255  TROPONINI 0.03* 0.11* 0.16* 0.08*    CBG:  Recent Labs Lab 02/25/16 0838 02/25/16 1214 02/25/16 1751 02/25/16 2201 02/26/16 0811  GLUCAP 78 78 91 74 115*    Recent Results (from the past 240 hour(s))  MRSA PCR Screening     Status: None   Collection Time: 02/23/16  9:32 AM  Result Value Ref Range Status   MRSA by PCR NEGATIVE NEGATIVE Final    Comment:        The GeneXpert MRSA Assay (FDA approved for NASAL specimens only), is one component of a comprehensive MRSA colonization surveillance program. It is not intended to diagnose MRSA infection nor to guide or monitor treatment for MRSA infections.   Culture, blood (Routine X 2) w Reflex to ID Panel     Status: None (Preliminary result)   Collection Time: 02/23/16 10:45 AM  Result Value Ref Range Status   Specimen Description BLOOD RIGHT ANTECUBITAL  Final   Special Requests BOTTLES DRAWN AEROBIC ONLY 4CC  Final   Culture NO GROWTH 2 DAYS  Final   Report Status PENDING  Incomplete  Culture, blood (Routine X 2) w Reflex to ID Panel     Status: None (Preliminary result)   Collection Time: 02/23/16 10:55 AM    Result Value Ref Range Status   Specimen Description BLOOD BLOOD RIGHT HAND  Final   Special Requests BOTTLES DRAWN AEROBIC ONLY 5CC  Final   Culture NO GROWTH 2 DAYS  Final   Report Status PENDING  Incomplete  Culture, Urine     Status: None   Collection Time: 02/23/16  8:32 PM  Result Value Ref Range Status   Specimen Description URINE, CLEAN CATCH  Final   Special Requests NONE  Final   Culture NO GROWTH  Final   Report Status 02/25/2016 FINAL  Final     Time spent in discharge (includes decision making & examination of pt): >30 minutes  02/26/2016, 9:23 AM   Lonia BloodJeffrey T. Praneel Haisley, MD [facility] Office  [phone] Pager [phone]  On-Call/Text Page:      [email or web address]      password [email or web address]

## 2016-02-27 DIAGNOSIS — I10 Essential (primary) hypertension: Secondary | ICD-10-CM | POA: Diagnosis not present

## 2016-02-27 DIAGNOSIS — I4891 Unspecified atrial fibrillation: Secondary | ICD-10-CM | POA: Diagnosis not present

## 2016-02-27 DIAGNOSIS — R2689 Other abnormalities of gait and mobility: Secondary | ICD-10-CM | POA: Diagnosis not present

## 2016-02-27 DIAGNOSIS — D649 Anemia, unspecified: Secondary | ICD-10-CM | POA: Diagnosis not present

## 2016-02-28 LAB — CULTURE, BLOOD (ROUTINE X 2)
Culture: NO GROWTH
Culture: NO GROWTH

## 2016-03-01 ENCOUNTER — Telehealth: Payer: Self-pay | Admitting: Emergency Medicine

## 2016-03-01 NOTE — Telephone Encounter (Signed)
Ok for verbal 

## 2016-03-01 NOTE — Telephone Encounter (Signed)
[facility] wants to know if they can get verbal orders for PT. Once a week for 1 wk and twice a wk for 2 weeks. Please advise thanks.

## 2016-03-02 NOTE — Telephone Encounter (Signed)
Verbals given  

## 2016-03-03 DIAGNOSIS — M5416 Radiculopathy, lumbar region: Secondary | ICD-10-CM | POA: Diagnosis not present

## 2016-03-04 ENCOUNTER — Ambulatory Visit (INDEPENDENT_AMBULATORY_CARE_PROVIDER_SITE_OTHER): Payer: Medicare Other | Admitting: Internal Medicine

## 2016-03-04 ENCOUNTER — Other Ambulatory Visit (INDEPENDENT_AMBULATORY_CARE_PROVIDER_SITE_OTHER): Payer: Medicare Other

## 2016-03-04 VITALS — BP 116/72 | HR 76 | Temp 98.0°F | Resp 20 | Wt 99.0 lb

## 2016-03-04 DIAGNOSIS — K250 Acute gastric ulcer with hemorrhage: Secondary | ICD-10-CM

## 2016-03-04 DIAGNOSIS — I482 Chronic atrial fibrillation, unspecified: Secondary | ICD-10-CM

## 2016-03-04 DIAGNOSIS — I5032 Chronic diastolic (congestive) heart failure: Secondary | ICD-10-CM

## 2016-03-04 DIAGNOSIS — D62 Acute posthemorrhagic anemia: Secondary | ICD-10-CM

## 2016-03-04 DIAGNOSIS — K259 Gastric ulcer, unspecified as acute or chronic, without hemorrhage or perforation: Secondary | ICD-10-CM | POA: Insufficient documentation

## 2016-03-04 LAB — CBC WITH DIFFERENTIAL/PLATELET
BASOS ABS: 0 10*3/uL (ref 0.0–0.1)
Basophils Relative: 0 % (ref 0.0–3.0)
EOS ABS: 0 10*3/uL (ref 0.0–0.7)
Eosinophils Relative: 0.1 % (ref 0.0–5.0)
HEMATOCRIT: 32.4 % — AB (ref 36.0–46.0)
HEMOGLOBIN: 10.3 g/dL — AB (ref 12.0–15.0)
LYMPHS PCT: 6.6 % — AB (ref 12.0–46.0)
Lymphs Abs: 0.7 10*3/uL (ref 0.7–4.0)
MCHC: 31.9 g/dL (ref 30.0–36.0)
MCV: 91.4 fl (ref 78.0–100.0)
MONOS PCT: 7.5 % (ref 3.0–12.0)
Monocytes Absolute: 0.8 10*3/uL (ref 0.1–1.0)
Neutro Abs: 9.7 10*3/uL — ABNORMAL HIGH (ref 1.4–7.7)
Neutrophils Relative %: 85.8 % — ABNORMAL HIGH (ref 43.0–77.0)
PLATELETS: 711 10*3/uL — AB (ref 150.0–400.0)
RBC: 3.54 Mil/uL — AB (ref 3.87–5.11)
RDW: 17.3 % — ABNORMAL HIGH (ref 11.5–15.5)
WBC: 11.3 10*3/uL — AB (ref 4.0–10.5)

## 2016-03-04 NOTE — Progress Notes (Signed)
Subjective:    Patient ID: Tracy [name], female    DOB: 18-May-1926, 82 y.o.   MRN: [id number]  HPI  Here to f/u post hospn with PUD/GI bleed due to gastric ulcer assoc with anemia;  Family did not start her on iron pills yet, wasn't sure she needed it and not listed on d/c med list. S/p 4u PRBC, no further bleeding, now off anticoagulation/eliquis, and will eventually need restart asa 325.  Pt denies chest pain, increased sob or doe, wheezing, orthopnea, PND, palpitations, dizziness or syncope.  Tolerating new cardizem .  Has very mild bilat pedal edema.  Due for f/u cbc,  Pt denies new neurological symptoms such as new headache, or facial or extremity weakness or numbness   Pt denies polydipsia, polyuria. Due for f/u EGD at 8 wks  Wants to know if can stop the gabapentin since no longer has right lower back pain/sciatica Past Medical History:  Diagnosis Date  . Atrial fibrillation (HCC)   . CAROTID STENOSIS   . CVA (cerebrovascular accident) (HCC) 2002  . DIABETES MELLITUS, TYPE II   . Diverticulosis of colon   . DIVERTICULOSIS, COLON   . DM (diabetes mellitus), type 2 (HCC)   . DVT (deep venous thrombosis) (HCC)   . GERD   . GERD (gastroesophageal reflux disease)   . HTN (hypertension)   . Hx of adenomatous colonic polyps 2007  . Hyperlipidemia   . HYPERLIPIDEMIA   . Hypertension   . Osteopenia   . OSTEOPENIA   . PVD (peripheral vascular disease) (HCC)    recently dx w/ carotid -left  int carotid >90% 50-60% rt int carotid 02/2010  . PVD (peripheral vascular disease) (HCC)    lf  ABI 0.11 and rt 0.43  . Sinus node dysfunction (HCC) 2011  . Stroke [facility](HCC) July 01, 2014   Affected Left side  . Upper GI bleed 02/2016   Past Surgical History:  Procedure Laterality Date  . CAROTID ENDARTERECTOMY    . cataract eye surgery    . CNS vascular stent per Neurosurgeon    . ESOPHAGOGASTRODUODENOSCOPY (EGD) WITH PROPOFOL N/A 02/24/2016   Procedure: ESOPHAGOGASTRODUODENOSCOPY (EGD)  WITH PROPOFOL;  Surgeon: Kathi DerParag Brahmbhatt, MD;  Location: [facility];  Service: Gastroenterology;  Laterality: N/A;  . EYE SURGERY    . ILIAC ARTERY STENT  2011   Bilateral CIA stent  . lazer eye surgery    . Right Shoulder Surgery  2010  . TUBAL LIGATION      reports that she has quit smoking. She has never used smokeless tobacco. She reports that she does not drink alcohol or use drugs. family history includes Cancer in her mother; Diabetes in her sister. No Known Allergies Current Outpatient Prescriptions on File Prior to Visit  Medication Sig Dispense Refill  . atorvastatin (LIPITOR) 10 MG tablet 1/2 tab by mouth daily (Patient taking differently: Take 5 mg by mouth daily at 6 PM. 1/2 tab by mouth daily) 45 tablet 3  . Calcium Carbonate (CALTRATE 600) 1500 MG TABS Take 1 tablet by mouth 2 (two) times daily.     Marland Kitchen. diltiazem (CARDIZEM CD) 180 MG 24 hr capsule Take 1 capsule (180 mg total) by mouth daily. 30 capsule 0  . feeding supplement, ENSURE ENLIVE, (ENSURE ENLIVE) LIQD Take 237 mLs by mouth 2 (two) times daily between meals. 30 Bottle 1  . fenofibrate micronized (LOFIBRA) 134 MG capsule TAKE 1 CAPSULE (134 MG TOTAL) BY MOUTH DAILY. 90 capsule 3  .  gabapentin (NEURONTIN) 100 MG capsule Take 2 capsules (200 mg total) by mouth 3 (three) times daily. 180 capsule 0  . HYDROcodone-acetaminophen (NORCO/VICODIN) 5-325 MG tablet Take 1 tablet by mouth every 6 (six) hours as needed for moderate pain. 60 tablet 0  . Multiple Vitamin (MULTIVITAMIN) capsule Take 1 capsule by mouth daily. Centrum Silver    . Multiple Vitamins-Minerals (PRESERVISION AREDS 2) CAPS Take 1 capsule by mouth 2 (two) times daily.    Marland Kitchen. oxybutynin (DITROPAN-XL) 5 MG 24 hr tablet TAKE 1 TABLET BY MOUTH EVERY DAY 90 tablet 3  . pantoprazole (PROTONIX) 40 MG tablet Take 1 tablet (40 mg total) by mouth 2 (two) times daily before a meal. 60 tablet 1  . pentoxifylline (TRENTAL) 400 MG CR tablet TAKE 1 TABLET (400 MG TOTAL) BY  MOUTH DAILY. 90 tablet 3   No current facility-administered medications on file prior to visit.    Review of Systems  Constitutional: Negative for unusual diaphoresis or night sweats HENT: Negative for ear swelling or discharge Eyes: Negative for worsening visual haziness  Respiratory: Negative for choking and stridor.   Gastrointestinal: Negative for distension or worsening eructation Genitourinary: Negative for retention or change in urine volume.  Musculoskeletal: Negative for other MSK pain or swelling Skin: Negative for color change and worsening wound Neurological: Negative for tremors and numbness other than noted  Psychiatric/Behavioral: Negative for decreased concentration or agitation other than above   All other system neg per pt    Objective:   Physical Exam BP 116/72   Pulse 76   Temp 98 F (36.7 C) (Oral)   Resp 20   Wt 99 lb (44.9 kg)   SpO2 95%   BMI 20.00 kg/m  VS noted, examined in wheelchair/limited exam Constitutional: Pt appears in no apparent distress HENT: Head: NCAT.  Right Ear: External ear normal.  Left Ear: External ear normal.  Eyes: . Pupils are equal, round, and reactive to light. Conjunctivae and EOM are normal Neck: Normal range of motion. Neck supple.  Cardiovascular: Normal rate and regular rhythm.   Pulmonary/Chest: Effort normal and breath sounds without rales or wheezing.  Abd:  Soft, NT, ND, + BS Neurological: Pt is alert. Not confused , motor grossly intact Skin: Skin is warm. No rash, trace bipedal LE edema Psychiatric: Pt behavior is normal. No agitation.     Assessment & Plan:

## 2016-03-04 NOTE — Patient Instructions (Addendum)
Please re-start ASA 325 mg per day in 1 more week.  OK to stop the gabapentin since the right lower back pain is better after the shot (ESI)  OK to cancel the appt with Pulmonary for Dec 8 (I have sent a request)  Please continue all other medications as before, and refills have been done if requested.  Please have the pharmacy call with any other refills you may need.  Please keep your appointments with your specialists as you may have planned  Please return in 3 months, or sooner if needed

## 2016-03-04 NOTE — Progress Notes (Signed)
Pre visit review using our clinic review tool, if applicable. No additional management support is needed unless otherwise documented below in the visit note. 

## 2016-03-05 DIAGNOSIS — D649 Anemia, unspecified: Secondary | ICD-10-CM | POA: Diagnosis not present

## 2016-03-05 DIAGNOSIS — I4891 Unspecified atrial fibrillation: Secondary | ICD-10-CM | POA: Diagnosis not present

## 2016-03-05 DIAGNOSIS — R2689 Other abnormalities of gait and mobility: Secondary | ICD-10-CM | POA: Diagnosis not present

## 2016-03-05 DIAGNOSIS — I10 Essential (primary) hypertension: Secondary | ICD-10-CM | POA: Diagnosis not present

## 2016-03-06 NOTE — Assessment & Plan Note (Signed)
Asympt, exam benign, cont PPI, To restart asa in 1 more wk, f/u GI as planned (OV at 6 wks, then egd at 8 wks)

## 2016-03-06 NOTE — Assessment & Plan Note (Signed)
Overall stable overall by history and exam, and pt to continue medical treatment as before,  to f/u any worsening symptoms or concerns  

## 2016-03-06 NOTE — Assessment & Plan Note (Signed)
No further overt bleeding off eliquis, to cont off for now, f/u cbc

## 2016-03-06 NOTE — Assessment & Plan Note (Signed)
Rate and volume stable, cont same tx,  to f/u any worsening symptoms or concerns

## 2016-03-08 DIAGNOSIS — I4891 Unspecified atrial fibrillation: Secondary | ICD-10-CM | POA: Diagnosis not present

## 2016-03-08 DIAGNOSIS — R2689 Other abnormalities of gait and mobility: Secondary | ICD-10-CM | POA: Diagnosis not present

## 2016-03-08 DIAGNOSIS — D649 Anemia, unspecified: Secondary | ICD-10-CM | POA: Diagnosis not present

## 2016-03-08 DIAGNOSIS — I10 Essential (primary) hypertension: Secondary | ICD-10-CM | POA: Diagnosis not present

## 2016-03-10 ENCOUNTER — Telehealth: Payer: Self-pay | Admitting: Emergency Medicine

## 2016-03-10 DIAGNOSIS — I10 Essential (primary) hypertension: Secondary | ICD-10-CM | POA: Diagnosis not present

## 2016-03-10 DIAGNOSIS — R2689 Other abnormalities of gait and mobility: Secondary | ICD-10-CM | POA: Diagnosis not present

## 2016-03-10 DIAGNOSIS — D649 Anemia, unspecified: Secondary | ICD-10-CM | POA: Diagnosis not present

## 2016-03-10 DIAGNOSIS — I4891 Unspecified atrial fibrillation: Secondary | ICD-10-CM | POA: Diagnosis not present

## 2016-03-10 NOTE — Telephone Encounter (Addendum)
[facility] called and patient has eaten little to nothing in the past 4 days. She is also weak, confused and not taking her medicine. Can she have verbal orders for nursing. I scheduled an appt for patient to be seen 03/11/16 at 11:00. Please advise thanks.

## 2016-03-11 ENCOUNTER — Other Ambulatory Visit (INDEPENDENT_AMBULATORY_CARE_PROVIDER_SITE_OTHER): Payer: Medicare Other

## 2016-03-11 ENCOUNTER — Telehealth: Payer: Self-pay | Admitting: Emergency Medicine

## 2016-03-11 ENCOUNTER — Ambulatory Visit (INDEPENDENT_AMBULATORY_CARE_PROVIDER_SITE_OTHER): Payer: Medicare Other | Admitting: Internal Medicine

## 2016-03-11 ENCOUNTER — Ambulatory Visit: Payer: Medicare Other | Admitting: Cardiology

## 2016-03-11 ENCOUNTER — Emergency Department (HOSPITAL_COMMUNITY): Payer: Medicare Other

## 2016-03-11 ENCOUNTER — Encounter (HOSPITAL_COMMUNITY): Payer: Self-pay

## 2016-03-11 ENCOUNTER — Inpatient Hospital Stay (HOSPITAL_COMMUNITY)
Admission: EM | Admit: 2016-03-11 | Discharge: 2016-03-22 | DRG: 683 | Disposition: A | Discharge status: Skilled Nursing Facility | Payer: Medicare Other | Attending: Internal Medicine | Admitting: Internal Medicine

## 2016-03-11 ENCOUNTER — Encounter: Payer: Self-pay | Admitting: Internal Medicine

## 2016-03-11 VITALS — BP 118/64 | HR 87 | Temp 97.8°F | Resp 20

## 2016-03-11 DIAGNOSIS — R6881 Early satiety: Secondary | ICD-10-CM | POA: Diagnosis present

## 2016-03-11 DIAGNOSIS — E1151 Type 2 diabetes mellitus with diabetic peripheral angiopathy without gangrene: Secondary | ICD-10-CM | POA: Diagnosis not present

## 2016-03-11 DIAGNOSIS — M8580 Other specified disorders of bone density and structure, unspecified site: Secondary | ICD-10-CM | POA: Diagnosis not present

## 2016-03-11 DIAGNOSIS — N179 Acute kidney failure, unspecified: Principal | ICD-10-CM

## 2016-03-11 DIAGNOSIS — I482 Chronic atrial fibrillation, unspecified: Secondary | ICD-10-CM | POA: Diagnosis present

## 2016-03-11 DIAGNOSIS — D649 Anemia, unspecified: Secondary | ICD-10-CM | POA: Diagnosis not present

## 2016-03-11 DIAGNOSIS — R627 Adult failure to thrive: Secondary | ICD-10-CM | POA: Diagnosis not present

## 2016-03-11 DIAGNOSIS — Z8711 Personal history of peptic ulcer disease: Secondary | ICD-10-CM

## 2016-03-11 DIAGNOSIS — K5730 Diverticulosis of large intestine without perforation or abscess without bleeding: Secondary | ICD-10-CM | POA: Diagnosis present

## 2016-03-11 DIAGNOSIS — F05 Delirium due to known physiological condition: Secondary | ICD-10-CM | POA: Diagnosis present

## 2016-03-11 DIAGNOSIS — R32 Unspecified urinary incontinence: Secondary | ICD-10-CM | POA: Diagnosis not present

## 2016-03-11 DIAGNOSIS — I10 Essential (primary) hypertension: Secondary | ICD-10-CM | POA: Diagnosis present

## 2016-03-11 DIAGNOSIS — F0390 Unspecified dementia without behavioral disturbance: Secondary | ICD-10-CM | POA: Diagnosis present

## 2016-03-11 DIAGNOSIS — Z7982 Long term (current) use of aspirin: Secondary | ICD-10-CM

## 2016-03-11 DIAGNOSIS — R630 Anorexia: Secondary | ICD-10-CM | POA: Diagnosis present

## 2016-03-11 DIAGNOSIS — D72829 Elevated white blood cell count, unspecified: Secondary | ICD-10-CM | POA: Diagnosis not present

## 2016-03-11 DIAGNOSIS — E785 Hyperlipidemia, unspecified: Secondary | ICD-10-CM | POA: Diagnosis present

## 2016-03-11 DIAGNOSIS — E876 Hypokalemia: Secondary | ICD-10-CM

## 2016-03-11 DIAGNOSIS — E870 Hyperosmolality and hypernatremia: Secondary | ICD-10-CM | POA: Diagnosis not present

## 2016-03-11 DIAGNOSIS — R531 Weakness: Secondary | ICD-10-CM

## 2016-03-11 DIAGNOSIS — K5909 Other constipation: Secondary | ICD-10-CM | POA: Diagnosis not present

## 2016-03-11 DIAGNOSIS — K219 Gastro-esophageal reflux disease without esophagitis: Secondary | ICD-10-CM | POA: Diagnosis present

## 2016-03-11 DIAGNOSIS — E860 Dehydration: Secondary | ICD-10-CM

## 2016-03-11 DIAGNOSIS — K5900 Constipation, unspecified: Secondary | ICD-10-CM | POA: Diagnosis not present

## 2016-03-11 DIAGNOSIS — I4891 Unspecified atrial fibrillation: Secondary | ICD-10-CM | POA: Diagnosis present

## 2016-03-11 DIAGNOSIS — R339 Retention of urine, unspecified: Secondary | ICD-10-CM | POA: Diagnosis not present

## 2016-03-11 DIAGNOSIS — R109 Unspecified abdominal pain: Secondary | ICD-10-CM

## 2016-03-11 DIAGNOSIS — K259 Gastric ulcer, unspecified as acute or chronic, without hemorrhage or perforation: Secondary | ICD-10-CM | POA: Diagnosis present

## 2016-03-11 DIAGNOSIS — R000 Tachycardia, unspecified: Secondary | ICD-10-CM | POA: Diagnosis not present

## 2016-03-11 DIAGNOSIS — R410 Disorientation, unspecified: Secondary | ICD-10-CM

## 2016-03-11 DIAGNOSIS — Z8673 Personal history of transient ischemic attack (TIA), and cerebral infarction without residual deficits: Secondary | ICD-10-CM

## 2016-03-11 DIAGNOSIS — Z7409 Other reduced mobility: Secondary | ICD-10-CM

## 2016-03-11 DIAGNOSIS — G9340 Encephalopathy, unspecified: Secondary | ICD-10-CM | POA: Diagnosis not present

## 2016-03-11 LAB — HEPATIC FUNCTION PANEL
ALBUMIN: 2.9 g/dL — AB (ref 3.5–5.0)
ALK PHOS: 41 U/L (ref 38–126)
ALT: 29 U/L (ref 0–35)
ALT: 32 U/L (ref 14–54)
AST: 35 U/L (ref 0–37)
AST: 60 U/L — ABNORMAL HIGH (ref 15–41)
Albumin: 3.1 g/dL — ABNORMAL LOW (ref 3.5–5.2)
Alkaline Phosphatase: 48 U/L (ref 39–117)
BILIRUBIN DIRECT: 0.2 mg/dL (ref 0.0–0.3)
BILIRUBIN INDIRECT: 0.7 mg/dL (ref 0.3–0.9)
BILIRUBIN TOTAL: 0.6 mg/dL (ref 0.2–1.2)
BILIRUBIN TOTAL: 1.2 mg/dL (ref 0.3–1.2)
Bilirubin, Direct: 0.5 mg/dL (ref 0.1–0.5)
TOTAL PROTEIN: 6.6 g/dL (ref 6.0–8.3)
TOTAL PROTEIN: 5.8 g/dL — AB (ref 6.5–8.1)

## 2016-03-11 LAB — BASIC METABOLIC PANEL
Anion gap: 11 (ref 5–15)
BUN: 42 mg/dL — ABNORMAL HIGH (ref 6–23)
BUN: 40 mg/dL — ABNORMAL HIGH (ref 6–20)
CALCIUM: 12.1 mg/dL — AB (ref 8.4–10.5)
CHLORIDE: 101 mmol/L (ref 101–111)
CO2: 36 mEq/L — ABNORMAL HIGH (ref 19–32)
CO2: 32 mmol/L (ref 22–32)
CREATININE: 2.14 mg/dL — AB (ref 0.40–1.20)
CREATININE: 2.21 mg/dL — AB (ref 0.44–1.00)
Calcium: 11.9 mg/dL — ABNORMAL HIGH (ref 8.9–10.3)
Chloride: 103 mEq/L (ref 96–112)
GFR, EST AFRICAN AMERICAN: 22 mL/min — AB (ref >60)
GFR, EST NON AFRICAN AMERICAN: 19 mL/min — AB (ref >60)
GFR: 28.04 mL/min — ABNORMAL LOW (ref >60.00)
Glucose, Bld: 136 mg/dL — ABNORMAL HIGH (ref 70–99)
Glucose, Bld: 133 mg/dL — ABNORMAL HIGH (ref 65–99)
POTASSIUM: 2.8 mmol/L — AB (ref 3.5–5.1)
Potassium: 2.8 mEq/L — CL (ref 3.5–5.1)
SODIUM: 144 mmol/L (ref 135–145)
Sodium: 149 mEq/L — ABNORMAL HIGH (ref 135–145)

## 2016-03-11 LAB — CBC
HCT: 37.1 % (ref 36.0–46.0)
Hemoglobin: 11.3 g/dL — ABNORMAL LOW (ref 12.0–15.0)
MCH: 28.4 pg (ref 26.0–34.0)
MCHC: 30.5 g/dL (ref 30.0–36.0)
MCV: 93.2 fL (ref 78.0–100.0)
PLATELETS: 547 10*3/uL — AB (ref 150–400)
RBC: 3.98 MIL/uL (ref 3.87–5.11)
RDW: 16.2 % — ABNORMAL HIGH (ref 11.5–15.5)
WBC: 13 10*3/uL — AB (ref 4.0–10.5)

## 2016-03-11 LAB — URINALYSIS, ROUTINE W REFLEX MICROSCOPIC
BILIRUBIN URINE: NEGATIVE
Bacteria, UA: NONE SEEN
GLUCOSE, UA: NEGATIVE mg/dL
HGB URINE DIPSTICK: NEGATIVE
Ketones, ur: NEGATIVE mg/dL
NITRITE: NEGATIVE
PH: 7 (ref 5.0–8.0)
Protein, ur: 30 mg/dL — AB
SPECIFIC GRAVITY, URINE: 1.011 (ref 1.005–1.030)

## 2016-03-11 LAB — LIPASE, BLOOD: Lipase: <10 U/L — ABNORMAL LOW (ref 11–51)

## 2016-03-11 LAB — I-STAT CG4 LACTIC ACID, ED: LACTIC ACID, VENOUS: 1.11 mmol/L (ref 0.5–1.9)

## 2016-03-11 LAB — I-STAT TROPONIN, ED: Troponin i, poc: 0.07 ng/mL (ref 0.00–0.08)

## 2016-03-11 LAB — TSH: TSH: 3.05 u[IU]/mL (ref 0.35–4.50)

## 2016-03-11 MED ORDER — SODIUM CHLORIDE 0.9% FLUSH
3.0000 mL | Freq: Two times a day (BID) | INTRAVENOUS | Status: DC
  Administered 2016-03-12 – 2016-03-19 (×6): 3 mL via INTRAVENOUS

## 2016-03-11 MED ORDER — PENTOXIFYLLINE ER 400 MG PO TBCR
400.0000 mg | EXTENDED_RELEASE_TABLET | Freq: Every morning | ORAL | Status: DC
  Administered 2016-03-12 – 2016-03-22 (×6): 400 mg via ORAL
  Filled 2016-03-11 (×11): qty 1

## 2016-03-11 MED ORDER — ONDANSETRON HCL 4 MG PO TABS: 4.0000 mg | ORAL_TABLET | Freq: Four times a day (QID) | ORAL | Status: DC | PRN

## 2016-03-11 MED ORDER — ACETAMINOPHEN 650 MG RE SUPP: 650.0000 mg | Freq: Four times a day (QID) | RECTAL | Status: DC | PRN

## 2016-03-11 MED ORDER — SODIUM CHLORIDE 0.9 % IV SOLN
30.0000 meq | Freq: Once | INTRAVENOUS | Status: AC
  Administered 2016-03-11: 30 meq via INTRAVENOUS
  Filled 2016-03-11 (×2): qty 15

## 2016-03-11 MED ORDER — ENSURE ENLIVE PO LIQD
237.0000 mL | Freq: Two times a day (BID) | ORAL | Status: DC
  Administered 2016-03-12 – 2016-03-21 (×10): 237 mL via ORAL

## 2016-03-11 MED ORDER — ACETAMINOPHEN 325 MG PO TABS
650.0000 mg | ORAL_TABLET | Freq: Four times a day (QID) | ORAL | Status: DC | PRN
  Administered 2016-03-17: 650 mg via ORAL
  Filled 2016-03-11: qty 2

## 2016-03-11 MED ORDER — ATORVASTATIN CALCIUM 10 MG PO TABS
5.0000 mg | ORAL_TABLET | Freq: Every day | ORAL | Status: DC
  Administered 2016-03-12 – 2016-03-18 (×7): 5 mg via ORAL
  Filled 2016-03-11 (×8): qty 1

## 2016-03-11 MED ORDER — MINERAL OIL RE ENEM
1.0000 | ENEMA | Freq: Once | RECTAL | Status: AC
  Administered 2016-03-12: 1 via RECTAL
  Filled 2016-03-11: qty 1

## 2016-03-11 MED ORDER — ASPIRIN 325 MG PO TABS
325.0000 mg | ORAL_TABLET | Freq: Every day | ORAL | Status: DC
  Administered 2016-03-12: 325 mg via ORAL
  Filled 2016-03-11: qty 1

## 2016-03-11 MED ORDER — POTASSIUM CHLORIDE IN NACL 20-0.9 MEQ/L-% IV SOLN
INTRAVENOUS | Status: DC
  Administered 2016-03-12: 01:00:00 via INTRAVENOUS
  Filled 2016-03-11: qty 1000

## 2016-03-11 MED ORDER — HYDROCODONE-ACETAMINOPHEN 5-325 MG PO TABS
1.0000 | ORAL_TABLET | ORAL | Status: DC | PRN
  Administered 2016-03-16: 1 via ORAL
  Filled 2016-03-11: qty 1

## 2016-03-11 MED ORDER — SODIUM CHLORIDE 0.9 % IV BOLUS (SEPSIS)
500.0000 mL | Freq: Once | INTRAVENOUS | Status: AC
  Administered 2016-03-11: 500 mL via INTRAVENOUS

## 2016-03-11 MED ORDER — ONDANSETRON HCL 4 MG/2ML IJ SOLN: 4.0000 mg | Freq: Four times a day (QID) | INTRAMUSCULAR | Status: DC | PRN

## 2016-03-11 MED ORDER — OXYBUTYNIN CHLORIDE ER 5 MG PO TB24
5.0000 mg | ORAL_TABLET | Freq: Every day | ORAL | Status: DC
  Administered 2016-03-12 – 2016-03-13 (×2): 5 mg via ORAL
  Filled 2016-03-11 (×3): qty 1

## 2016-03-11 MED ORDER — PANTOPRAZOLE SODIUM 40 MG IV SOLR
40.0000 mg | INTRAVENOUS | Status: DC
  Administered 2016-03-12: 40 mg via INTRAVENOUS
  Filled 2016-03-11: qty 40

## 2016-03-11 MED ORDER — DILTIAZEM HCL ER COATED BEADS 180 MG PO CP24
180.0000 mg | ORAL_CAPSULE | Freq: Every day | ORAL | Status: DC
  Administered 2016-03-12 – 2016-03-13 (×2): 180 mg via ORAL
  Filled 2016-03-11 (×2): qty 1

## 2016-03-11 MED ORDER — PROSIGHT PO TABS
1.0000 | ORAL_TABLET | Freq: Two times a day (BID) | ORAL | Status: DC
  Administered 2016-03-12 – 2016-03-22 (×16): 1 via ORAL
  Filled 2016-03-11 (×20): qty 1

## 2016-03-11 MED ORDER — HEPARIN SODIUM (PORCINE) 5000 UNIT/ML IJ SOLN
5000.0000 [IU] | Freq: Three times a day (TID) | INTRAMUSCULAR | Status: DC
  Administered 2016-03-12: 5000 [IU] via SUBCUTANEOUS
  Filled 2016-03-11: qty 1

## 2016-03-11 MED ORDER — MAGNESIUM SULFATE IN D5W 1-5 GM/100ML-% IV SOLN
1.0000 g | Freq: Once | INTRAVENOUS | Status: AC
  Administered 2016-03-11: 1 g via INTRAVENOUS
  Filled 2016-03-11: qty 100

## 2016-03-11 MED ORDER — POLYETHYLENE GLYCOL 3350 17 G PO PACK
17.0000 g | PACK | Freq: Two times a day (BID) | ORAL | Status: DC
  Administered 2016-03-12 – 2016-03-14 (×5): 17 g via ORAL
  Filled 2016-03-11 (×7): qty 1

## 2016-03-11 MED ORDER — DILTIAZEM HCL 25 MG/5ML IV SOLN
5.0000 mg | INTRAVENOUS | Status: DC | PRN
  Administered 2016-03-12: 5 mg via INTRAVENOUS
  Filled 2016-03-11 (×3): qty 5

## 2016-03-11 NOTE — Progress Notes (Deleted)
Cardiology Office Note   Date:  03/11/2016   ID:  Tracy [name], DOB 02/20/1927, MRN [id number]  PCP:  Tracy BarreJames John, MD  Cardiologist:  Dr. Johney FrameAllred    No chief complaint on file.     History of Present Illness: Tracy [name] is a 82 y.o. female who presents for new a fib since 02/04/16 at same time was hypotensive.  .   She has a hx of CVA not felt to be embolic.  Hx of sinus brady with no indication for pacing.   Other hx HTN.  CHA2DS2-VASc Scoreis 6. Eliquis started that admission- d/c'd 02/11/16 0hgb on that admit was 7.6 and she rec'd 2 units of blood) and then 02/23/16 admitted for large GI bleed with large GE junction ulcer and gastric fundus ulcer (gastric ulcer w/ hemorrhage).  Her eliquis had been stopped due to br;uising but by the 20TH she had 2 major episodes of hematemesis, filling up a cup each time, bright red blood, with another less intense episode on transport to the ED.  Arrival HGB was 5.1.  She was transfused 3 units PRBCs.  troponins peaked at 0.16  TSH has been normal.    Pt was placed on Cardizem with that admit for a fib with RVR.  It was felt she was not a candidate for anticoagulation. Her follow up CBC is stable. Her pulse was stable as well.    Today***    Past Medical History:  Diagnosis Date  . Atrial fibrillation (HCC)   . CAROTID STENOSIS   . CVA (cerebrovascular accident) (HCC) 2002  . DIABETES MELLITUS, TYPE II   . Diverticulosis of colon   . DIVERTICULOSIS, COLON   . DM (diabetes mellitus), type 2 (HCC)   . DVT (deep venous thrombosis) (HCC)   . GERD   . GERD (gastroesophageal reflux disease)   . HTN (hypertension)   . Hx of adenomatous colonic polyps 2007  . Hyperlipidemia   . HYPERLIPIDEMIA   . Hypertension   . Osteopenia   . OSTEOPENIA   . PVD (peripheral vascular disease) (HCC)    recently dx w/ carotid -left  int carotid >90% 50-60% rt int carotid 02/2010  . PVD (peripheral vascular disease) (HCC)    lf  ABI 0.11 and rt 0.43   . Sinus node dysfunction (HCC) 2011  . Stroke [facility](HCC) July 01, 2014   Affected Left side  . Upper GI bleed 02/2016    Past Surgical History:  Procedure Laterality Date  . CAROTID ENDARTERECTOMY    . cataract eye surgery    . CNS vascular stent per Neurosurgeon    . ESOPHAGOGASTRODUODENOSCOPY (EGD) WITH PROPOFOL N/A 02/24/2016   Procedure: ESOPHAGOGASTRODUODENOSCOPY (EGD) WITH PROPOFOL;  Surgeon: Kathi DerParag Brahmbhatt, MD;  Location: [facility];  Service: Gastroenterology;  Laterality: N/A;  . EYE SURGERY    . ILIAC ARTERY STENT  2011   Bilateral CIA stent  . lazer eye surgery    . Right Shoulder Surgery  2010  . TUBAL LIGATION       Current Outpatient Prescriptions  Medication Sig Dispense Refill  . atorvastatin (LIPITOR) 10 MG tablet 1/2 tab by mouth daily (Patient taking differently: Take 5 mg by mouth daily at 6 PM. 1/2 tab by mouth daily) 45 tablet 3  . Calcium Carbonate (CALTRATE 600) 1500 MG TABS Take 1 tablet by mouth 2 (two) times daily.     Marland Kitchen. diltiazem (CARDIZEM CD) 180 MG 24 hr capsule Take 1 capsule (180 mg total)  by mouth daily. 30 capsule 0  . feeding supplement, ENSURE ENLIVE, (ENSURE ENLIVE) LIQD Take 237 mLs by mouth 2 (two) times daily between meals. 30 Bottle 1  . fenofibrate micronized (LOFIBRA) 134 MG capsule TAKE 1 CAPSULE (134 MG TOTAL) BY MOUTH DAILY. 90 capsule 3  . gabapentin (NEURONTIN) 100 MG capsule Take 2 capsules (200 mg total) by mouth 3 (three) times daily. 180 capsule 0  . HYDROcodone-acetaminophen (NORCO/VICODIN) 5-325 MG tablet Take 1 tablet by mouth every 6 (six) hours as needed for moderate pain. 60 tablet 0  . Multiple Vitamin (MULTIVITAMIN) capsule Take 1 capsule by mouth daily. Centrum Silver    . Multiple Vitamins-Minerals (PRESERVISION AREDS 2) CAPS Take 1 capsule by mouth 2 (two) times daily.    Marland Kitchen. oxybutynin (DITROPAN-XL) 5 MG 24 hr tablet TAKE 1 TABLET BY MOUTH EVERY DAY 90 tablet 3  . pantoprazole (PROTONIX) 40 MG tablet Take 1 tablet (40  mg total) by mouth 2 (two) times daily before a meal. 60 tablet 1  . pentoxifylline (TRENTAL) 400 MG CR tablet TAKE 1 TABLET (400 MG TOTAL) BY MOUTH DAILY. 90 tablet 3   No current facility-administered medications for this visit.     Allergies:   Patient has no known allergies.    Social History:  The patient  reports that she has quit smoking. She has never used smokeless tobacco. She reports that she does not drink alcohol or use drugs.   Family History:  The patient's ***family history includes Cancer in her mother; Diabetes in her sister.    ROS:  General:no colds or fevers, no weight changes Skin:no rashes or ulcers HEENT:no blurred vision, no congestion CV:see HPI PUL:see HPI GI:no diarrhea constipation or melena, no indigestion GU:no hematuria, no dysuria MS:no joint pain, no claudication Neuro:no syncope, no lightheadedness Endo:no diabetes, no thyroid disease Wt Readings from Last 3 Encounters:  03/04/16 99 lb (44.9 kg)  02/26/16 106 lb 11.2 oz (48.4 kg)  02/12/16 112 lb (50.8 kg)     PHYSICAL EXAM: VS:  There were no vitals taken for this visit. , BMI There is no height or weight on file to calculate BMI. General:Pleasant affect, NAD Skin:Warm and dry, brisk capillary refill HEENT:normocephalic, sclera clear, mucus membranes moist Neck:supple, no JVD, no bruits  Heart:S1S2 RRR without murmur, gallup, rub or click Lungs:clear without rales, rhonchi, or wheezes ZOX:WRUEAbd:soft, non tender, + BS, do not palpate liver spleen or masses Ext:no lower ext edema, 2+ pedal pulses, 2+ radial pulses Neuro:alert and oriented, MAE, follows commands, + facial symmetry    EKG:  EKG is ordered today. The ekg ordered today demonstrates ***   Recent Labs: 05/27/2015: TSH 1.21 02/24/2016: ALT 23 02/25/2016: Magnesium 2.0 02/26/2016: BUN 10; Creatinine, Ser 0.77; Potassium 4.4; Sodium 141 03/04/2016: Hemoglobin 10.3; Platelets 711.0    Lipid Panel    Component Value Date/Time     CHOL 70 02/25/2016 0259   TRIG 126 02/25/2016 0259   HDL 22 (L) 02/25/2016 0259   CHOLHDL 3.2 02/25/2016 0259   VLDL 25 02/25/2016 0259   LDLCALC 23 02/25/2016 0259       Other studies Reviewed: Additional studies/ records that were reviewed today include: ***.   ASSESSMENT AND PLAN:  1.  ***   Current medicines are reviewed with the patient today.  The patient Has no concerns regarding medicines.  The following changes have been made:  See above Labs/ tests ordered today include:see above  Disposition:   FU:  see above  Signed, Nada Boozer, NP  03/11/2016 1:04 PM    [facility] [address] [place], [place], [place]  [address]/ [address] [place], [place] Phone: [phone]; Fax: [phone]  [phone]

## 2016-03-11 NOTE — Patient Instructions (Signed)
OK to stop the miralax if not working  Please start Senakot 1 by mouth at bedtime each night  Please continue all other medications as before, and refills have been done if requested.  Please have the pharmacy call with any other refills you may need.  Please keep your appointments with your specialists as you may have planned  Please go to the LAB in the Basement (turn left off the elevator) for the tests to be done today  You will be contacted by phone if any changes need to be made immediately.  Otherwise, you will receive a letter about your results with an explanation, but please check with MyChart first.  Please remember to sign up for MyChart if you have not done so, as this will be important to you in the future with finding out test results, communicating by private email, and scheduling acute appointments online when needed.

## 2016-03-11 NOTE — H&P (Signed)
History and Physical    Horton FinerOphelia W Fahrner UJW:[id number]:[id number] DOB: 24-Nov-1926 DOA: 03/11/2016  PCP: Oliver BarreJames John, MD   Patient coming from: Home, by way of PCP office   Chief Complaint: Gen weakness, food refusal, lab abnormalities  HPI: Tracy [name] is a 82 y.o. female with medical history significant for dementia, history of CVA, chronic atrial fibrillation not anticoagulated due to GI bleeding, hypertension, and peripheral vascular disease who presents to the emergency department at the direction of her PCP for evaluation of generalized weakness with food refusal and lab abnormalities. Patient is accompanied by her son and daughter who assist with the history. Patient reportedly lives with her son who is at the bedside and had been in her usual state until approximately 4 days ago when she seemed to be generally weak and was refusing food and drink. She has continued to exhibit increasing generalized weakness and continues to refuse any food or drink, and has not been taking her medications. She has not voiced any complaints, has not been noted to have much of a cough, and there has been no diarrhea or vomiting. There has been no recent fall or trauma, no long distance travel, and no sick contacts. Patient was taken to her PCP earlier today for evaluation of these complaints and basic blood work revealed an acute kidney injury and critical hypokalemia. She was directed to the emergency department for further evaluation of this.  ED Course: Upon arrival to the ED, patient is found to be afebrile, saturating well on room air, tachycardic in the low 100s, and with stable blood pressure and respirations. EKG features in atrial fibrillation with rate 111 and left axis deviation. Chest x-ray is negative for acute cardiopulmonary disease. Chemistry panel features a potassium of 2.8, BUN of 40, and serum creatinine of 2.21, up from 0.77 just two weeks prior. Chemistries also revealed a serum albumin of 2.9 and  calcium of 11.9. CBC is notable for a leukocytosis to 13,000, thrombocytosis to 547,000, and a stable normocytic anemia with hemoglobin of 11.3. Lactic acid is reassuring at 1.1, troponin is within the normal limits, and urinalysis is unremarkable. Patient was given 500 mL of normal saline in the emergency department and 30 mEq of IV potassium. She has remained hemodynamically stable in the ED, tachycardia resolved with fluid, and she has not been in any respiratory distress. She will be observed on the telemetry unit for ongoing evaluation and management of constipation with food refusal and subsequent dehydration with AKI and hypokalemia.   Review of Systems:  All other systems reviewed and apart from HPI, are negative.  Past Medical History:  Diagnosis Date  . Atrial fibrillation (HCC)   . CAROTID STENOSIS   . CVA (cerebrovascular accident) (HCC) 2002  . DIABETES MELLITUS, TYPE II   . Diverticulosis of colon   . DIVERTICULOSIS, COLON   . DM (diabetes mellitus), type 2 (HCC)   . DVT (deep venous thrombosis) (HCC)   . GERD   . GERD (gastroesophageal reflux disease)   . HTN (hypertension)   . Hx of adenomatous colonic polyps 2007  . Hyperlipidemia   . HYPERLIPIDEMIA   . Hypertension   . Osteopenia   . OSTEOPENIA   . PVD (peripheral vascular disease) (HCC)    recently dx w/ carotid -left  int carotid >90% 50-60% rt int carotid 02/2010  . PVD (peripheral vascular disease) (HCC)    lf  ABI 0.11 and rt 0.43  . Sinus node dysfunction (HCC) 2011  .  Stroke [facility]) July 01, 2014   Affected Left side  . Upper GI bleed 02/2016    Past Surgical History:  Procedure Laterality Date  . CAROTID ENDARTERECTOMY    . cataract eye surgery    . CNS vascular stent per Neurosurgeon    . ESOPHAGOGASTRODUODENOSCOPY (EGD) WITH PROPOFOL N/A 02/24/2016   Procedure: ESOPHAGOGASTRODUODENOSCOPY (EGD) WITH PROPOFOL;  Surgeon: Kathi Der, MD;  Location: [facility] ENDOSCOPY;  Service: Gastroenterology;   Laterality: N/A;  . EYE SURGERY    . ILIAC ARTERY STENT  2011   Bilateral CIA stent  . lazer eye surgery    . Right Shoulder Surgery  2010  . TUBAL LIGATION       reports that she has quit smoking. She has never used smokeless tobacco. She reports that she does not drink alcohol or use drugs.  No Known Allergies  Family History  Problem Relation Age of Onset  . Cancer Mother     colon  . Diabetes Sister     Amputation     Prior to Admission medications   Medication Sig Start Date End Date Taking? Authorizing Provider  aspirin 325 MG tablet Take 325 mg by mouth daily.   Yes Historical Provider, MD  atorvastatin (LIPITOR) 10 MG tablet 1/2 tab by mouth daily Patient taking differently: Take 5 mg by mouth daily at 6 PM. 1/2 tab by mouth daily 11/18/15  Yes Corwin Levins, MD  Calcium Carbonate (CALTRATE 600) 1500 MG TABS Take 1 tablet by mouth 2 (two) times daily.    Yes Historical Provider, MD  diltiazem (CARDIZEM CD) 180 MG 24 hr capsule Take 1 capsule (180 mg total) by mouth daily. 02/26/16  Yes Lonia Blood, MD  feeding supplement, ENSURE ENLIVE, (ENSURE ENLIVE) LIQD Take 237 mLs by mouth 2 (two) times daily between meals. 02/07/16  Yes Narda Bonds, MD  fenofibrate micronized (LOFIBRA) 134 MG capsule TAKE 1 CAPSULE (134 MG TOTAL) BY MOUTH DAILY. 04/17/15  Yes Corwin Levins, MD  Multiple Vitamin (MULTIVITAMIN) capsule Take 1 capsule by mouth daily. Centrum Silver   Yes Historical Provider, MD  Multiple Vitamins-Minerals (PRESERVISION AREDS 2) CAPS Take 1 capsule by mouth 2 (two) times daily.   Yes Historical Provider, MD  oxybutynin (DITROPAN-XL) 5 MG 24 hr tablet TAKE 1 TABLET BY MOUTH EVERY DAY 10/27/15  Yes Corwin Levins, MD  pantoprazole (PROTONIX) 40 MG tablet Take 1 tablet (40 mg total) by mouth 2 (two) times daily before a meal. 02/26/16  Yes Lonia Blood, MD  pentoxifylline (TRENTAL) 400 MG CR tablet TAKE 1 TABLET (400 MG TOTAL) BY MOUTH DAILY. 04/17/15  Yes Corwin Levins, MD  gabapentin (NEURONTIN) 100 MG capsule Take 2 capsules (200 mg total) by mouth 3 (three) times daily. Patient not taking: Reported on 03/11/2016 02/07/16   Narda Bonds, MD  HYDROcodone-acetaminophen (NORCO/VICODIN) 5-325 MG tablet Take 1 tablet by mouth every 6 (six) hours as needed for moderate pain. Patient not taking: Reported on 03/11/2016 02/12/16   Corwin Levins, MD    Physical Exam: Vitals:   03/11/16 1830 03/11/16 1900 03/11/16 1930 03/11/16 2030  BP: 167/74 (!) 157/103 138/80 154/93  Pulse:  63 102 101  Resp: 19   23  Temp:      TempSrc:      SpO2:  99% 99% 99%      Constitutional: NAD, calm, comfortable Eyes: PERTLA, lids and conjunctivae normal ENMT: Mucous membranes are dry. Posterior pharynx clear of  any exudate or lesions.   Neck: normal, supple, no masses, no thyromegaly Respiratory: clear to auscultation bilaterally, no wheezing, no crackles. Normal respiratory effort.    Cardiovascular: Rate ~110 and irregular. No extremity edema. No significant JVD. Abdomen: No distension, no tenderness, no masses palpated. Bowel sounds normal.  Musculoskeletal: no clubbing / cyanosis. No joint deformity upper and lower extremities.   Skin: no significant rashes, lesions, ulcers. Warm, dry, well-perfused. Poor turgor Neurologic: No gross facial asymmetry, PERRL, EOMI. She is moving all extremities spontaneously, but does not cooperate with strength testing. Babinski is down-going bilaterally.   Psychiatric: Alert and oriented to person, but not oriented to place, situation, or time. She introduces her son and daughter as "my brothers". Normal mood and affect.     Labs on Admission: I have personally reviewed following labs and imaging studies  CBC:  Recent Labs Lab 03/11/16 1452  WBC 13.0*  HGB 11.3*  HCT 37.1  MCV 93.2  PLT 547*   Basic Metabolic Panel:  Recent Labs Lab 03/11/16 1203 03/11/16 1452  NA 149* 144  K 2.8* 2.8*  CL 103 101  CO2 36* 32    GLUCOSE 136* 133*  BUN 42* 40*  CREATININE 2.14* 2.21*  CALCIUM 12.1* 11.9*   GFR: Estimated Creatinine Clearance: 12.5 mL/min (by C-G formula based on SCr of 2.21 mg/dL (H)). Liver Function Tests:  Recent Labs Lab 03/11/16 1203 03/11/16 1715  AST 35 60*  ALT 29 32  ALKPHOS 48 41  BILITOT 0.6 1.2  PROT 6.6 5.8*  ALBUMIN 3.1* 2.9*    Recent Labs Lab 03/11/16 1715  LIPASE <10*   No results for input(s): AMMONIA in the last 168 hours. Coagulation Profile: No results for input(s): INR, PROTIME in the last 168 hours. Cardiac Enzymes: No results for input(s): CKTOTAL, CKMB, CKMBINDEX, TROPONINI in the last 168 hours. BNP (last 3 results) No results for input(s): PROBNP in the last 8760 hours. HbA1C: No results for input(s): HGBA1C in the last 72 hours. CBG: No results for input(s): GLUCAP in the last 168 hours. Lipid Profile: No results for input(s): CHOL, HDL, LDLCALC, TRIG, CHOLHDL, LDLDIRECT in the last 72 hours. Thyroid Function Tests:  Recent Labs  03/11/16 1203  TSH 3.05   Anemia Panel: No results for input(s): VITAMINB12, FOLATE, FERRITIN, TIBC, IRON, RETICCTPCT in the last 72 hours. Urine analysis:    Component Value Date/Time   COLORURINE YELLOW 03/11/2016 1855   APPEARANCEUR CLEAR 03/11/2016 1855   LABSPEC 1.011 03/11/2016 1855   PHURINE 7.0 03/11/2016 1855   GLUCOSEU NEGATIVE 03/11/2016 1855   GLUCOSEU NEGATIVE 02/13/2016 1201   HGBUR NEGATIVE 03/11/2016 1855   BILIRUBINUR NEGATIVE 03/11/2016 1855   KETONESUR NEGATIVE 03/11/2016 1855   PROTEINUR 30 (A) 03/11/2016 1855   UROBILINOGEN 0.2 02/13/2016 1201   NITRITE NEGATIVE 03/11/2016 1855   LEUKOCYTESUR TRACE (A) 03/11/2016 1855   Sepsis Labs: @LABRCNTIP (procalcitonin:4,lacticidven:4) )No results found for this or any previous visit (from the past 240 hour(s)).   Radiological Exams on Admission: Dg Chest 2 View  Result Date: 03/11/2016 CLINICAL DATA:  82 year old female with increased  weakness and decreased p.o. intake. Initial encounter. EXAM: CHEST  2 VIEW COMPARISON:  Chest radiographs 02/04/2016 and earlier. FINDINGS: Stable cardiomegaly and mediastinal contours. Shallower lung volumes on the lateral view today. Chronic calcified pleural plaques. Calcified aortic atherosclerosis. No pneumothorax or pulmonary edema. No pleural effusion suspected today. No definite acute pulmonary opacity. Stable visualized osseous structures. , including benign chondroid appearing sclerotic matrix in the  proximal right humerus. IMPRESSION: 1. Stable cardiomegaly. No acute cardiopulmonary abnormality. Calcified pleural plaques. 2.  Calcified aortic atherosclerosis. Electronically Signed   By: Odessa Fleming M.D.   On: 03/11/2016 18:38    EKG: Independently reviewed. Atrial fibrillation with RVR (rate 111), LAD   Assessment/Plan  1. Acute kidney injury  - SCr is 2.21 on admission, up from 0.77 two weeks prior  - She is clinically dry on arrival and a prerenal azotemia is suspected in setting of food and drink refusal for last several days  - Anticipate improvement with IVF hydration  - She was given 500 cc NS in ED and will be continued on NS infusion overnight while avoiding nephrotoxic agents  - Plan to repeat chemistries in the am    2. Hypokalemia  - Serum potassium is 2.8 on admission  - She was given 30 mEq IV potassium in ED; 1 g of magnesium was administered  - Monitor on telemetry  - Repeat chem panel with mag level in am   3. Generalized weakness, loss of appetite - Patient was evaluated by PCP with normal TSH on 03/11/16 - Pt's son at the bedside believes this is secondary to constipation, reported several days of straining without BM, followed by passage of 3 "big hard balls" on 03/10/16; will address as below    4. Constipation  - Dehydration and calcium supplements likely aggravating  - Mineral oil enema, followed by MiraLax  - Escalate as needed    5. Chronic atrial  fibrillation  - CHADS-VASc is at least 60 (age x2, gender, hx CVA x2) - She is not anticoagulated d/t GI bleeds - She is rate-controlled with diltiazem; if she is unable to take by mouth, will convert to IV  - She is being monitored on telemetry    6. GERD, gastric ulcer  - EGD from November 2017 with esophageal ulcer and gastric ulcer - She is managed with Protonix and NSAID-avoidance; will convert Protonix to IV for now until she is eating  - GI notes report plans for repeat EGD in January to assess healing    7. Leukocytosis  - WBC is 13,000 on admission without fever or apparent source of infection  - CXR clear and UA not suggestive of infection; no upper resp sxs and no vomiting or diarrhea  - Plan to culture if febrile, but watch off of abx for now   8. Hypercalcemia - Serum calcium is 11.9 on admission in setting of marked dehydration and calcium supplements  - She is being fluid-resuscitated and calcium supplements will be held  - Repeat calcium level in am    DVT prophylaxis: sq heparin  Code Status: Full   Family Communication: Son and daughter updated at bedside  Disposition Plan: Observe on telemetry Consults called: None Admission status: Observation     Briscoe Deutscher, MD [facility] Pager [phone]  If 7PM-7AM, please contact night-coverage [email or web address] Password [facility]  03/11/2016, 9:41 PM

## 2016-03-11 NOTE — ED Provider Notes (Signed)
[facility] DEPT Provider Note   CSN: [id number] Arrival date & time: 03/11/16  1433   History   Chief Complaint Chief Complaint  Patient presents with  . Weakness    HPI Tracy [name] is a 82 y.o. female.  Patient with history of a fib on ASA anticoagulation, T2DM, CVA, and recent gastric ulcer presenting to ED after visit with PCP. History provided by family as patient with dementia and not able to fully participate in interview. Patient had reportedly had a decreased PO intake in last 3-4 days including medicines. Patient did not have complaints of any chest pain, shortness of breath, abdominal pain, headache or urinary symptoms. She has a history of urinary incontinence treated with oxybutinin, but even when she started taking it yesterday, she continued to have frequent incontinence and increase in frequency. She was evaluated by PCP for above this AM, and labs in office showed K of 2.8 and Cr of 2.2 (b/l 0.8). Patient now currently complaining of chest pain, shortness of breath, palpitations, abdominal pain, cough, fevers, or chills.      Past Medical History:  Diagnosis Date  . Atrial fibrillation (HCC)   . CAROTID STENOSIS   . CVA (cerebrovascular accident) (HCC) 2002  . DIABETES MELLITUS, TYPE II   . Diverticulosis of colon   . DIVERTICULOSIS, COLON   . DM (diabetes mellitus), type 2 (HCC)   . DVT (deep venous thrombosis) (HCC)   . GERD   . GERD (gastroesophageal reflux disease)   . HTN (hypertension)   . Hx of adenomatous colonic polyps 2007  . Hyperlipidemia   . HYPERLIPIDEMIA   . Hypertension   . Osteopenia   . OSTEOPENIA   . PVD (peripheral vascular disease) (HCC)    recently dx w/ carotid -left  int carotid >90% 50-60% rt int carotid 02/2010  . PVD (peripheral vascular disease) (HCC)    lf  ABI 0.11 and rt 0.43  . Sinus node dysfunction (HCC) 2011  . Stroke [facility](HCC) July 01, 2014   Affected Left side  . Upper GI bleed 02/2016    Patient Active  Problem List   Diagnosis Date Noted  . Urine incontinence 03/11/2016  . Gastric ulcer 03/04/2016  . Acute blood loss anemia   . Chronic diastolic CHF (congestive heart failure) (HCC)   . Dilated cardiomyopathy (HCC)   . Chronic atrial fibrillation (HCC)   . Pulmonary hypertension   . Right lumbar radiculopathy 02/12/2016  . Superficial bruising of foot 02/12/2016  . Irregular heart beats 02/04/2016  . General weakness 02/04/2016  . Anemia, iron deficiency 02/04/2016  . Hypercalcemia 02/04/2016  . Right sided sciatica 11/18/2015  . Trigger point of right shoulder region 05/13/2015  . Cerebrovascular accident (CVA) due to thrombosis of right posterior cerebral artery (HCC) 04/17/2015  . PVD (peripheral vascular disease) (HCC) 04/17/2015  . Overactive bladder 11/15/2014  . Weakness 10/24/2014  . Cerebral infarction due to thrombosis of right middle cerebral artery (HCC) 10/17/2014  . Monoplegia of left lower extremity (HCC) 08/16/2014  . Gait disturbance, post-stroke 08/16/2014  . Altered sensation due to stroke (HCC) 07/15/2014  . Hip pain, acute   . Bradycardia 07/01/2014  . Elevated troponin 07/01/2014  . Dysuria 07/01/2014  . Constipation 07/01/2014  . Right ankle pain 01/29/2014  . Rotator cuff tear arthropathy of right shoulder 06/19/2013  . Left rotator cuff tear arthropathy 05/17/2013  . Aneurysm of iliac artery (HCC) 01/26/2012  . Peripheral vascular disease (HCC) 01/26/2012  . Left shoulder pain  02/09/2011  . Preventative health care 08/02/2010  . Sinus bradycardia 05/29/2010  . SICK SINUS SYNDROME 03/10/2010  . Occlusion and stenosis of carotid artery 03/10/2010  . PVD WITH CLAUDICATION 02/23/2010  . CONSTIPATION 10/01/2008  . NECK MASS 10/01/2008  . Diabetes (HCC) 08/01/2007  . DIVERTICULOSIS, COLON 08/01/2007  . SHOULDER PAIN, RIGHT 08/01/2007  . History of colonic polyps 08/01/2007  . GERD 12/25/2006  . Disorder of bone and cartilage 12/25/2006  .  Hyperlipidemia 11/29/2006  . Essential hypertension 11/29/2006    Past Surgical History:  Procedure Laterality Date  . CAROTID ENDARTERECTOMY    . cataract eye surgery    . CNS vascular stent per Neurosurgeon    . ESOPHAGOGASTRODUODENOSCOPY (EGD) WITH PROPOFOL N/A 02/24/2016   Procedure: ESOPHAGOGASTRODUODENOSCOPY (EGD) WITH PROPOFOL;  Surgeon: Kathi Der, MD;  Location: [facility];  Service: Gastroenterology;  Laterality: N/A;  . EYE SURGERY    . ILIAC ARTERY STENT  2011   Bilateral CIA stent  . lazer eye surgery    . Right Shoulder Surgery  2010  . TUBAL LIGATION      OB History    Gravida Para Term Preterm AB Living   0 0 0 0 0     SAB TAB Ectopic Multiple Live Births   0 0 0           Home Medications    Prior to Admission medications   Medication Sig Start Date End Date Taking? Authorizing Provider  atorvastatin (LIPITOR) 10 MG tablet 1/2 tab by mouth daily Patient taking differently: Take 5 mg by mouth daily at 6 PM. 1/2 tab by mouth daily 11/18/15   Corwin Levins, MD  Calcium Carbonate (CALTRATE 600) 1500 MG TABS Take 1 tablet by mouth 2 (two) times daily.     Historical Provider, MD  diltiazem (CARDIZEM CD) 180 MG 24 hr capsule Take 1 capsule (180 mg total) by mouth daily. 02/26/16   Lonia Blood, MD  feeding supplement, ENSURE ENLIVE, (ENSURE ENLIVE) LIQD Take 237 mLs by mouth 2 (two) times daily between meals. 02/07/16   Narda Bonds, MD  fenofibrate micronized (LOFIBRA) 134 MG capsule TAKE 1 CAPSULE (134 MG TOTAL) BY MOUTH DAILY. 04/17/15   Corwin Levins, MD  gabapentin (NEURONTIN) 100 MG capsule Take 2 capsules (200 mg total) by mouth 3 (three) times daily. 02/07/16   Narda Bonds, MD  HYDROcodone-acetaminophen (NORCO/VICODIN) 5-325 MG tablet Take 1 tablet by mouth every 6 (six) hours as needed for moderate pain. 02/12/16   Corwin Levins, MD  Multiple Vitamin (MULTIVITAMIN) capsule Take 1 capsule by mouth daily. Centrum Silver    Historical Provider, MD    Multiple Vitamins-Minerals (PRESERVISION AREDS 2) CAPS Take 1 capsule by mouth 2 (two) times daily.    Historical Provider, MD  oxybutynin (DITROPAN-XL) 5 MG 24 hr tablet TAKE 1 TABLET BY MOUTH EVERY DAY 10/27/15   Corwin Levins, MD  pantoprazole (PROTONIX) 40 MG tablet Take 1 tablet (40 mg total) by mouth 2 (two) times daily before a meal. 02/26/16   Lonia Blood, MD  pentoxifylline (TRENTAL) 400 MG CR tablet TAKE 1 TABLET (400 MG TOTAL) BY MOUTH DAILY. 04/17/15   Corwin Levins, MD    Family History Family History  Problem Relation Age of Onset  . Cancer Mother     colon  . Diabetes Sister     Amputation    Social History Social History  Substance Use Topics  . Smoking status:  Former Smoker  . Smokeless tobacco: Never Used     Comment: QUIT IN 61  . Alcohol use No     Allergies   Patient has no known allergies.   Review of Systems Review of Systems  Constitutional: Positive for appetite change. Negative for activity change, chills, fatigue and fever.  HENT: Negative for congestion.   Eyes: Negative for visual disturbance.  Respiratory: Negative for cough, choking, chest tightness, shortness of breath and wheezing.   Cardiovascular: Negative for chest pain, palpitations and leg swelling.  Gastrointestinal: Positive for constipation. Negative for abdominal distention, abdominal pain, blood in stool, diarrhea, nausea and vomiting.  Endocrine: Negative for cold intolerance and heat intolerance.  Genitourinary: Positive for frequency. Negative for decreased urine volume, difficulty urinating, dyspareunia, dysuria, flank pain, hematuria and urgency.       Incontinence  Musculoskeletal: Negative for arthralgias.  Skin: Negative for rash.  Neurological: Negative for dizziness, facial asymmetry and light-headedness.  Psychiatric/Behavioral: Negative for agitation, behavioral problems and confusion. The patient is not hyperactive.        Patient with dementia; per family has  had no change in behavior other than refusal of PO intake     Physical Exam Updated Vital Signs BP (!) 157/103   Pulse 63   Temp 98 F (36.7 C) (Oral)   Resp 19   SpO2 99%   Physical Exam  Constitutional: She appears well-developed. She appears cachectic. She does not appear ill. No distress.  HENT:  Head: Normocephalic and atraumatic.  Eyes: Conjunctivae, EOM and lids are normal. Pupils are equal, round, and reactive to light.  Neck: Trachea normal and normal range of motion. No JVD present.  Cardiovascular: S1 normal, S2 normal and intact distal pulses.  An irregularly irregular rhythm present. Exam reveals no gallop.   No murmur heard. Pulmonary/Chest: Effort normal. No accessory muscle usage. No respiratory distress. She has no wheezes. She has no rhonchi. She has no rales.  Abdominal: Soft. Normal appearance and bowel sounds are normal. She exhibits no distension and no mass. There is no tenderness.  Musculoskeletal: Normal range of motion.  Neurological: She is alert. She has normal strength. No cranial nerve deficit or sensory deficit. GCS eye subscore is 4. GCS verbal subscore is 5. GCS motor subscore is 6.  Skin: Skin is warm, dry and intact. No abrasion, no lesion and no rash noted.     ED Treatments / Results  Labs (all labs ordered are listed, but only abnormal results are displayed) Labs Reviewed  BASIC METABOLIC PANEL - Abnormal; Notable for the following:       Result Value   Potassium 2.8 (*)    Glucose, Bld 133 (*)    BUN 40 (*)    Creatinine, Ser 2.21 (*)    Calcium 11.9 (*)    GFR calc non Af Amer 19 (*)    GFR calc Af Amer 22 (*)    All other components within normal limits  CBC - Abnormal; Notable for the following:    WBC 13.0 (*)    Hemoglobin 11.3 (*)    RDW 16.2 (*)    Platelets 547 (*)    All other components within normal limits  URINALYSIS, ROUTINE W REFLEX MICROSCOPIC - Abnormal; Notable for the following:    Protein, ur 30 (*)     Leukocytes, UA TRACE (*)    Squamous Epithelial / LPF 0-5 (*)    All other components within normal limits  HEPATIC FUNCTION PANEL -  Abnormal; Notable for the following:    Total Protein 5.8 (*)    Albumin 2.9 (*)    AST 60 (*)    All other components within normal limits  LIPASE, BLOOD - Abnormal; Notable for the following:    Lipase <10 (*)    All other components within normal limits  CBG MONITORING, ED  I-STAT TROPOININ, ED  I-STAT CG4 LACTIC ACID, ED  I-STAT CG4 LACTIC ACID, ED  Rosezena SensorI-STAT TROPOININ, ED    EKG  EKG Interpretation  Date/Time:  Thursday March 11 2016 14:39:00 EST Ventricular Rate:  111 PR Interval:    QRS Duration: 106 QT Interval:  348 QTC Calculation: 473 R Axis:   -37 Text Interpretation:  Atrial fibrillation with rapid ventricular response Left axis deviation Septal infarct , age undetermined Abnormal ECG Atrial fibrillation Confirmed by Kandis MannanMACKUEN, COURTNEY ([id number]) on 03/11/2016 4:39:08 PM       Radiology Dg Chest 2 View  Result Date: 03/11/2016 CLINICAL DATA:  82 year old female with increased weakness and decreased p.o. intake. Initial encounter. EXAM: CHEST  2 VIEW COMPARISON:  Chest radiographs 02/04/2016 and earlier. FINDINGS: Stable cardiomegaly and mediastinal contours. Shallower lung volumes on the lateral view today. Chronic calcified pleural plaques. Calcified aortic atherosclerosis. No pneumothorax or pulmonary edema. No pleural effusion suspected today. No definite acute pulmonary opacity. Stable visualized osseous structures. , including benign chondroid appearing sclerotic matrix in the proximal right humerus. IMPRESSION: 1. Stable cardiomegaly. No acute cardiopulmonary abnormality. Calcified pleural plaques. 2.  Calcified aortic atherosclerosis. Electronically Signed   By: Odessa FlemingH  Hall M.D.   On: 03/11/2016 18:38    Procedures Procedures (including critical care time)  Medications Ordered in ED Medications  potassium chloride 30 mEq in sodium  chloride 0.9 % 265 mL (KCL MULTIRUN) IVPB (30 mEq Intravenous Given 03/11/16 1831)  sodium chloride 0.9 % bolus 500 mL (0 mLs Intravenous Stopped 03/11/16 1800)  sodium chloride 0.9 % bolus 500 mL (500 mLs Intravenous New Bag/Given 03/11/16 1831)    Initial Impression / Assessment and Plan / ED Course  I have reviewed the triage vital signs and the nursing notes.  Pertinent labs & imaging results that were available during my care of the patient were reviewed by me and considered in my medical decision making (see chart for details).  Clinical Course    Tracy [name] is a 82yo female with PMH of a fib, T2DM, CVA x 2, dementia and gastric ulcer presenting with 3-4 days of decreased PO intake with AKI (Cr 2.2 from baseline of 0.8) and hypokalemia at 2.8. EKG showing a fib with rate of ~100 without ST segment or T wave changes, and CXR unremarkable. UA not concerning for UTI. On exam patient with baseline mental status per family, VS stable, irregularly irregular rhythm, regular rate, lungs clear to auscultation, +BS, soft abdomen without tenderness. Patient received 1L NS bolus and 30mEq KCl run. Patient discussed with admitting team and they are in agreement with admission for AKI.   Family updated and in agreement with plan; no further questions at this time.   Final Clinical Impressions(s) / ED Diagnoses   Final diagnoses:  Dehydration  Hypokalemia  AKI (acute kidney injury) [facility](HCC)   New Prescriptions New Prescriptions   No medications on file     Nyra MarketGorica Alayia Meggison, MD 03/11/16 2322    Courteney Lyn Mackuen, MD 03/21/16 2017

## 2016-03-11 NOTE — Telephone Encounter (Signed)
Received critical lab for Potassium at 2.8

## 2016-03-11 NOTE — Progress Notes (Signed)
Pre visit review using our clinic review tool, if applicable. No additional management support is needed unless otherwise documented below in the visit note. 

## 2016-03-11 NOTE — Progress Notes (Signed)
Subjective:    Patient ID: Tinamarie W Llerena, female    DOB: 05/12/1926, 82 y.o.   MRN:[name] [id number]  HPI  Here to f/u with son who provides the hx, pt not eating or drinking well for unclear reasons x 3 days, has lost some wt., etiology unclear, Does c/o ongoing fatigue, and just not herself per son but cannot be more specific. Wt Readings from Last 3 Encounters:  03/04/16 99 lb (44.9 kg)  02/26/16 106 lb 11.2 oz (48.4 kg)  02/12/16 112 lb (50.8 kg)  Also constipated in the past wk., Has some occasional urinary incontinence.  Denies urinary symptoms such as dysuria, frequency, urgency, flank pain, hematuria or n/v, fever, chills. S/p recent egd , and Denies worsening reflux, abd pain, dysphagia, n/v, or blood.   Past Medical History:  Diagnosis Date  . Atrial fibrillation (HCC)   . CAROTID STENOSIS   . CVA (cerebrovascular accident) (HCC) 2002  . DIABETES MELLITUS, TYPE II   . Diverticulosis of colon   . DIVERTICULOSIS, COLON   . DM (diabetes mellitus), type 2 (HCC)   . DVT (deep venous thrombosis) (HCC)   . GERD   . GERD (gastroesophageal reflux disease)   . HTN (hypertension)   . Hx of adenomatous colonic polyps 2007  . Hyperlipidemia   . HYPERLIPIDEMIA   . Hypertension   . Osteopenia   . OSTEOPENIA   . PVD (peripheral vascular disease) (HCC)    recently dx w/ carotid -left  int carotid >90% 50-60% rt int carotid 02/2010  . PVD (peripheral vascular disease) (HCC)    lf  ABI 0.11 and rt 0.43  . Sinus node dysfunction (HCC) 2011  . Stroke [facility](HCC) July 01, 2014   Affected Left side  . Upper GI bleed 02/2016   Past Surgical History:  Procedure Laterality Date  . CAROTID ENDARTERECTOMY    . cataract eye surgery    . CNS vascular stent per Neurosurgeon    . ESOPHAGOGASTRODUODENOSCOPY (EGD) WITH PROPOFOL N/A 02/24/2016   Procedure: ESOPHAGOGASTRODUODENOSCOPY (EGD) WITH PROPOFOL;  Surgeon: Kathi DerParag Brahmbhatt, MD;  Location: [facility];  Service: Gastroenterology;  Laterality:  N/A;  . EYE SURGERY    . ILIAC ARTERY STENT  2011   Bilateral CIA stent  . lazer eye surgery    . Right Shoulder Surgery  2010  . TUBAL LIGATION      reports that she has quit smoking. She has never used smokeless tobacco. She reports that she does not drink alcohol or use drugs. family history includes Cancer in her mother; Diabetes in her sister. No Known Allergies No current facility-administered medications on file prior to visit.    Current Outpatient Prescriptions on File Prior to Visit  Medication Sig Dispense Refill  . atorvastatin (LIPITOR) 10 MG tablet 1/2 tab by mouth daily (Patient taking differently: Take 5 mg by mouth daily at 6 PM. 1/2 tab by mouth daily) 45 tablet 3  . Calcium Carbonate (CALTRATE 600) 1500 MG TABS Take 1 tablet by mouth 2 (two) times daily.     Marland Kitchen. diltiazem (CARDIZEM CD) 180 MG 24 hr capsule Take 1 capsule (180 mg total) by mouth daily. 30 capsule 0  . feeding supplement, ENSURE ENLIVE, (ENSURE ENLIVE) LIQD Take 237 mLs by mouth 2 (two) times daily between meals. 30 Bottle 1  . fenofibrate micronized (LOFIBRA) 134 MG capsule TAKE 1 CAPSULE (134 MG TOTAL) BY MOUTH DAILY. 90 capsule 3  . gabapentin (NEURONTIN) 100 MG capsule Take 2 capsules (200  mg total) by mouth 3 (three) times daily. (Patient not taking: Reported on 03/11/2016) 180 capsule 0  . HYDROcodone-acetaminophen (NORCO/VICODIN) 5-325 MG tablet Take 1 tablet by mouth every 6 (six) hours as needed for moderate pain. (Patient not taking: Reported on 03/11/2016) 60 tablet 0  . Multiple Vitamin (MULTIVITAMIN) capsule Take 1 capsule by mouth daily. Centrum Silver    . Multiple Vitamins-Minerals (PRESERVISION AREDS 2) CAPS Take 1 capsule by mouth 2 (two) times daily.    Marland Kitchen. oxybutynin (DITROPAN-XL) 5 MG 24 hr tablet TAKE 1 TABLET BY MOUTH EVERY DAY 90 tablet 3  . pantoprazole (PROTONIX) 40 MG tablet Take 1 tablet (40 mg total) by mouth 2 (two) times daily before a meal. 60 tablet 1  . pentoxifylline (TRENTAL)  400 MG CR tablet TAKE 1 TABLET (400 MG TOTAL) BY MOUTH DAILY. 90 tablet 3    Review of Systems  unable due to pt unable    Objective:   Physical Exam BP 118/64   Pulse 87   Temp 97.8 F (36.6 C) (Oral)   Resp 20   SpO2 91%  VS noted, fatigued appaering Constitutional: Pt appears in no apparent distress HENT: Head: NCAT.  Right Ear: External ear normal.  Left Ear: External ear normal.  Eyes: . Pupils are equal, round, and reactive to light. Conjunctivae and EOM are normal Neck: Normal range of motion. Neck supple.  Cardiovascular: Normal rate and regular rhythm.   Pulmonary/Chest: Effort normal and breath sounds without rales or wheezing.  Abd:  Soft, NT, ND, + BS Neurological: Pt is alert. Not confused , motor grossly intact Skin: Skin is warm. No rash, no LE edema Psychiatric: Pt behavior is normal. No agitation.     Assessment & Plan:

## 2016-03-11 NOTE — ED Triage Notes (Signed)
Pt here with family, sent by PCP. Pt has had increased weakness, decreased PO intake as well as bowel incontinence. Pt sent by PCP for possible dehydration.

## 2016-03-12 ENCOUNTER — Observation Stay (HOSPITAL_COMMUNITY): Payer: Medicare Other

## 2016-03-12 ENCOUNTER — Institutional Professional Consult (permissible substitution): Payer: Medicare Other | Admitting: Internal Medicine

## 2016-03-12 DIAGNOSIS — K5900 Constipation, unspecified: Secondary | ICD-10-CM | POA: Diagnosis not present

## 2016-03-12 DIAGNOSIS — N179 Acute kidney failure, unspecified: Secondary | ICD-10-CM | POA: Diagnosis not present

## 2016-03-12 DIAGNOSIS — E860 Dehydration: Secondary | ICD-10-CM

## 2016-03-12 LAB — COMPREHENSIVE METABOLIC PANEL
ALK PHOS: 43 U/L (ref 38–126)
ALT: 34 U/L (ref 14–54)
AST: 46 U/L — ABNORMAL HIGH (ref 15–41)
Albumin: 2.9 g/dL — ABNORMAL LOW (ref 3.5–5.0)
Anion gap: 12 (ref 5–15)
BUN: 32 mg/dL — AB (ref 6–20)
CALCIUM: 10.9 mg/dL — AB (ref 8.9–10.3)
CO2: 28 mmol/L (ref 22–32)
CREATININE: 1.87 mg/dL — AB (ref 0.44–1.00)
Chloride: 110 mmol/L (ref 101–111)
GFR calc non Af Amer: 23 mL/min — ABNORMAL LOW (ref >60)
GFR, EST AFRICAN AMERICAN: 27 mL/min — AB (ref >60)
GLUCOSE: 126 mg/dL — AB (ref 65–99)
Potassium: 3.3 mmol/L — ABNORMAL LOW (ref 3.5–5.1)
SODIUM: 150 mmol/L — AB (ref 135–145)
Total Bilirubin: 0.6 mg/dL (ref 0.3–1.2)
Total Protein: 6.3 g/dL — ABNORMAL LOW (ref 6.5–8.1)

## 2016-03-12 LAB — MAGNESIUM
Magnesium: 2.2 mg/dL (ref 1.7–2.4)
Magnesium: 2.3 mg/dL (ref 1.7–2.4)

## 2016-03-12 LAB — GLUCOSE, CAPILLARY: GLUCOSE-CAPILLARY: 105 mg/dL — AB (ref 65–99)

## 2016-03-12 LAB — CBC
HEMATOCRIT: 33.8 % — AB (ref 36.0–46.0)
Hemoglobin: 10.2 g/dL — ABNORMAL LOW (ref 12.0–15.0)
MCH: 28.2 pg (ref 26.0–34.0)
MCHC: 30.2 g/dL (ref 30.0–36.0)
MCV: 93.4 fL (ref 78.0–100.0)
Platelets: 482 10*3/uL — ABNORMAL HIGH (ref 150–400)
RBC: 3.62 MIL/uL — ABNORMAL LOW (ref 3.87–5.11)
RDW: 16.1 % — AB (ref 11.5–15.5)
WBC: 11 10*3/uL — ABNORMAL HIGH (ref 4.0–10.5)

## 2016-03-12 MED ORDER — LACTULOSE 10 GM/15ML PO SOLN: 30.0000 g | Freq: Every day | ORAL | Status: DC | PRN

## 2016-03-12 MED ORDER — ASPIRIN 325 MG PO TABS
325.0000 mg | ORAL_TABLET | Freq: Every day | ORAL | Status: DC
  Administered 2016-03-13 – 2016-03-22 (×7): 325 mg via ORAL
  Filled 2016-03-12 (×9): qty 1

## 2016-03-12 MED ORDER — METOPROLOL TARTRATE 5 MG/5ML IV SOLN
5.0000 mg | Freq: Four times a day (QID) | INTRAVENOUS | Status: DC | PRN
  Administered 2016-03-13: 2.5 mg via INTRAVENOUS
  Administered 2016-03-14 – 2016-03-19 (×3): 5 mg via INTRAVENOUS
  Filled 2016-03-12 (×4): qty 5

## 2016-03-12 MED ORDER — SODIUM CHLORIDE 0.9 % IV SOLN: INTRAVENOUS | Status: DC

## 2016-03-12 MED ORDER — FLEET ENEMA 7-19 GM/118ML RE ENEM
1.0000 | ENEMA | Freq: Once | RECTAL | Status: AC
  Administered 2016-03-12: 1 via RECTAL
  Filled 2016-03-12: qty 1

## 2016-03-12 MED ORDER — SENNA 8.6 MG PO TABS
1.0000 | ORAL_TABLET | Freq: Two times a day (BID) | ORAL | Status: DC
  Administered 2016-03-12 – 2016-03-15 (×6): 8.6 mg via ORAL
  Filled 2016-03-12 (×9): qty 1

## 2016-03-12 MED ORDER — PANTOPRAZOLE SODIUM 40 MG PO TBEC
40.0000 mg | DELAYED_RELEASE_TABLET | Freq: Every day | ORAL | Status: DC
  Administered 2016-03-12 – 2016-03-13 (×2): 40 mg via ORAL
  Filled 2016-03-12 (×2): qty 1

## 2016-03-12 MED ORDER — DILTIAZEM HCL 25 MG/5ML IV SOLN
5.0000 mg | Freq: Once | INTRAVENOUS | Status: AC
  Administered 2016-03-12: 5 mg via INTRAVENOUS
  Filled 2016-03-12: qty 5

## 2016-03-12 MED ORDER — FLEET ENEMA 7-19 GM/118ML RE ENEM: 1.0000 | ENEMA | Freq: Once | RECTAL | Status: DC

## 2016-03-12 MED ORDER — DEXTROSE 5 % IV SOLN
INTRAVENOUS | Status: DC
  Administered 2016-03-12 (×2): 1000 mL via INTRAVENOUS
  Administered 2016-03-13 – 2016-03-16 (×6): via INTRAVENOUS
  Administered 2016-03-17: 1000 mL via INTRAVENOUS
  Administered 2016-03-17: 06:00:00 via INTRAVENOUS

## 2016-03-12 MED ORDER — POTASSIUM CHLORIDE CRYS ER 20 MEQ PO TBCR
40.0000 meq | EXTENDED_RELEASE_TABLET | Freq: Once | ORAL | Status: AC
  Administered 2016-03-12: 40 meq via ORAL
  Filled 2016-03-12: qty 2

## 2016-03-12 NOTE — Progress Notes (Signed)
Patient has a poor appetite; refused Ensure@2  PM and also she refused lunch. MD aware. Will continue to monitor.

## 2016-03-12 NOTE — Progress Notes (Signed)
PT Cancellation Note  Patient Details Name: Tracy [name] MRN: [id number] DOB: 20-Sep-1926   Cancelled Treatment:    Reason Eval/Treat Not Completed: Other (comment) (Pt is lethargic and declined to get up and will try tomorr)ow as she is able.   Ivar DrapeStout, Jaime Dome E 03/12/2016, 3:20 PM    Samul Dadauth Larenda Reedy, PT MS Acute Rehab Dept. Number: [facility] [phone] and [facility] [phone]

## 2016-03-12 NOTE — Progress Notes (Signed)
Note pt is in A. fib. (aware of pt history of A. fib) HR is ranging from 96-142. Donnamarie PoagK. Kirby paged after 5mg  of cardiazem was given IV push with no noticeable results. Average HR is about 110. New orders placed for additional 5mg  of Cardiazem. Will continue to monitor patient.

## 2016-03-12 NOTE — Progress Notes (Signed)
PROGRESS NOTE    Tracy FinerOphelia W [name]  WJX:[id number] DOB: [birth year] DOA: 03/11/2016 PCP: Oliver BarreJames John, MD   Brief Narrative: : Tracy [name] is a 82 y.o. female with medical history significant for dementia, history of CVA, chronic atrial fibrillation not anticoagulated due to GI bleeding, hypertension, and peripheral vascular disease who presents to the emergency department at the direction of her PCP for evaluation of generalized weakness with food refusal and lab abnormalities. Patient is accompanied by her son and daughter who assist with the history. Patient reportedly lives with her son who is at the bedside and had been in her usual state until approximately 4 days ago when she seemed to be generally weak and was refusing food and drink. She has continued to exhibit increasing generalized weakness and continues to refuse any food or drink, and has not been taking her medications.    Assessment & Plan:   Principal Problem:   Acute kidney injury (HCC) Active Problems:   Essential hypertension   GERD   Constipation   General weakness   Atrial fibrillation with RVR (HCC)   Hypercalcemia   Chronic atrial fibrillation (HCC)   Gastric ulcer   Hypokalemia   Loss of appetite   Leukocytosis   AKI (acute kidney injury) (HCC)   Dehydration  1. Acute kidney injury  - SCr is 2.21 on admission, up from 0.77 two weeks prior  - improving with IV fluids, cr has decreased to 1.8.  -still dry, continue with IV fluids. Change to D 5 due to hypernatremia.   2. Hypokalemia  - Serum potassium is 2.8 on admission  - She was given 30 mEq IV potassium in ED; 1 g of magnesium was administered  -improving. Will give 40 meq times one.    3. Generalized weakness, loss of appetite - Patient was evaluated by PCP with normal TSH on 03/11/16 - Pt's son at the bedside believes this is secondary to constipation, reported several days of straining without BM, followed by passage of 3 "big hard balls" on  03/10/16; will address as below   continue with IV fluids.  -check urine culture.   4. Constipation  - Dehydration and calcium supplements likely aggravating  - very small BM today after fleet enema./ will check KUB, will order suppository as needed.  -continue with miralax.   5. Chronic atrial fibrillation  - CHADS-VASc is at least 545 (age x2, gender, hx CVA x2) - She is not anticoagulated d/t GI bleeds, recent admission 2 weeks ago.  - continue with Cardizem., PRN lopressor ordered   6. GERD, gastric ulcer  - EGD from November 2017 with esophageal ulcer and gastric ulcer - Continue with protonix.   7. Leukocytosis  - WBC is 13,000 on admission without fever or apparent source of infection  - CXR clear and UA not suggestive of infection; no upper resp sxs and no vomiting or diarrhea  - Plan to culture if febrile, but watch off of abx for now  -follow urine culture.   8. Hypercalcemia - Serum calcium is 11.9 on admission in setting of marked dehydration and calcium supplements  - She is being fluid-resuscitated and calcium supplements will be held  - improving with fluids.     DVT prophylaxis: SCD due to recent GI bleed.  Code Status: full code.  Family Communication: sons at bed side.  Disposition Plan: home when stable.  Consultants:   none   Procedures: none   Antimicrobials: none    Subjective: She  denies pain. She doesn't know why she does not wants to eat.   Objective: Vitals:   03/11/16 2030 03/11/16 2200 03/11/16 2242 03/12/16 0457  BP: 154/93 168/97 (!) 160/86 (!) 126/37  Pulse: 101 100 (!) 126 (!) 108  Resp: 23  17 17   Temp:   98 F (36.7 C) 97.9 F (36.6 C)  TempSrc:   Oral Oral  SpO2: 99% 100% 97% 98%  Weight:    43 kg (94 lb 12.8 oz)    Intake/Output Summary (Last 24 hours) at 03/12/16 0855 Last data filed at 03/12/16 0824  Gross per 24 hour  Intake              760 ml  Output                0 ml  Net              760 ml   Filed  Weights   03/12/16 0457  Weight: 43 kg (94 lb 12.8 oz)    Examination:  General exam: Appears calm and comfortable  Respiratory system: Clear to auscultation. Respiratory effort normal. Cardiovascular system: S1 & S2 heard, RRR. No JVD, murmurs, rubs, gallops or clicks. No pedal edema. Gastrointestinal system: Abdomen is nondistended, soft and nontender. No organomegaly or masses felt. Normal bowel sounds heard. Central nervous system: Alert and oriented. No focal neurological deficits. Extremities: Symmetric 5 x 5 power. Skin: No rashes, lesions or ulcers Psychiatry: Judgement and insight appear normal. Mood & affect appropriate.     Data Reviewed: I have personally reviewed following labs and imaging studies  CBC:  Recent Labs Lab 03/11/16 1452  WBC 13.0*  HGB 11.3*  HCT 37.1  MCV 93.2  PLT 547*   Basic Metabolic Panel:  Recent Labs Lab 03/11/16 1203 03/11/16 1452 03/11/16 2340 03/12/16 0548  NA 149* 144  --  150*  K 2.8* 2.8*  --  3.3*  CL 103 101  --  110  CO2 36* 32  --  28  GLUCOSE 136* 133*  --  126*  BUN 42* 40*  --  32*  CREATININE 2.14* 2.21*  --  1.87*  CALCIUM 12.1* 11.9*  --  10.9*  MG  --   --  2.2 2.3   GFR: Estimated Creatinine Clearance: 14.7 mL/min (by C-G formula based on SCr of 1.87 mg/dL (H)). Liver Function Tests:  Recent Labs Lab 03/11/16 1203 03/11/16 1715 03/12/16 0548  AST 35 60* 46*  ALT 29 32 34  ALKPHOS 48 41 43  BILITOT 0.6 1.2 0.6  PROT 6.6 5.8* 6.3*  ALBUMIN 3.1* 2.9* 2.9*    Recent Labs Lab 03/11/16 1715  LIPASE <10*   No results for input(s): AMMONIA in the last 168 hours. Coagulation Profile: No results for input(s): INR, PROTIME in the last 168 hours. Cardiac Enzymes: No results for input(s): CKTOTAL, CKMB, CKMBINDEX, TROPONINI in the last 168 hours. BNP (last 3 results) No results for input(s): PROBNP in the last 8760 hours. HbA1C: No results for input(s): HGBA1C in the last 72 hours. CBG:  Recent  Labs Lab 03/12/16 0821  GLUCAP 105*   Lipid Profile: No results for input(s): CHOL, HDL, LDLCALC, TRIG, CHOLHDL, LDLDIRECT in the last 72 hours. Thyroid Function Tests:  Recent Labs  03/11/16 1203  TSH 3.05   Anemia Panel: No results for input(s): VITAMINB12, FOLATE, FERRITIN, TIBC, IRON, RETICCTPCT in the last 72 hours. Sepsis Labs:  Recent Labs Lab 03/11/16 2024  LATICACIDVEN 1.11  No results found for this or any previous visit (from the past 240 hour(s)).       Radiology Studies: Dg Chest 2 View  Result Date: 03/11/2016 CLINICAL DATA:  82 year old female with increased weakness and decreased p.o. intake. Initial encounter. EXAM: CHEST  2 VIEW COMPARISON:  Chest radiographs 02/04/2016 and earlier. FINDINGS: Stable cardiomegaly and mediastinal contours. Shallower lung volumes on the lateral view today. Chronic calcified pleural plaques. Calcified aortic atherosclerosis. No pneumothorax or pulmonary edema. No pleural effusion suspected today. No definite acute pulmonary opacity. Stable visualized osseous structures. , including benign chondroid appearing sclerotic matrix in the proximal right humerus. IMPRESSION: 1. Stable cardiomegaly. No acute cardiopulmonary abnormality. Calcified pleural plaques. 2.  Calcified aortic atherosclerosis. Electronically Signed   By: Odessa Fleming M.D.   On: 03/11/2016 18:38        Scheduled Meds: . aspirin  325 mg Oral Daily  . atorvastatin  5 mg Oral q1800  . diltiazem  180 mg Oral Daily  . feeding supplement (ENSURE ENLIVE)  237 mL Oral BID BM  . heparin  5,000 Units Subcutaneous Q8H  . multivitamin  1 tablet Oral BID  . oxybutynin  5 mg Oral Daily  . pantoprazole (PROTONIX) IV  40 mg Intravenous Q24H  . pentoxifylline  400 mg Oral q morning - 10a  . polyethylene glycol  17 g Oral BID  . sodium chloride flush  3 mL Intravenous Q12H   Continuous Infusions: . dextrose 1,000 mL (03/12/16 0822)     LOS: 0 days    Time spent: 35  minutes.     Alba Cory, MD [facility] Pager [phone]  If 7PM-7AM, please contact night-coverage [email or web address] Password [email or web address] 03/12/2016, 8:55 AM

## 2016-03-12 NOTE — Progress Notes (Signed)
MD notified about patient's HR being in 100-120.  New order for Metoprolol 5mg  Q6hrs PRN for sustain HR>100. Will continue to monitor.

## 2016-03-12 NOTE — Progress Notes (Signed)
Initial Nutrition Assessment  DOCUMENTATION CODES:   Not applicable  INTERVENTION:   -Magic Cup TID with meals -Continue Ensure Enlive po TID, each supplement provides 350 kcal and 20 grams of protein  NUTRITION DIAGNOSIS:   Inadequate oral intake related to lethargy/confusion, poor appetite as evidenced by per patient/family report, meal completion < 25%.  GOAL:   Patient will meet greater than or equal to 90% of their needs  MONITOR:   PO intake, Supplement acceptance, Labs, Weight trends, Skin, I & O's  REASON FOR ASSESSMENT:   Consult Assessment of nutrition requirement/status  ASSESSMENT:   Tracy [name] is a 82 y.o. female with medical history significant for dementia, history of CVA, chronic atrial fibrillation not anticoagulated due to GI bleeding, hypertension, and peripheral vascular disease who presents to the emergency department at the direction of her PCP for evaluation of generalized weakness with food refusal and lab abnormalities  Pt admitted with AKI.   Spoke with pt's two sons at bedside. They reports that pt has a poor appetite at baseline, however, have noticed a progressive decline in intake over the past 3 months, but most severe over the past 1-2 weeks. Per pt's sons, pt is refusing food and liquids and will often spit out foods and medications when they are not looking. Pt with very limited food acceptance and requesting that pt receive ice cream at all meals, because that is one food that pt will eat. They also have transitioned from Ensure to Ensure Plus due to calorie density.   Per pt sons, pt has always been petite, however, UBW is around 105#. They express concern over pt's weight loss and lack of intake.   Nutrition-Focused physical exam completed. Findings are mild fat depletion, mild muscle depletion, and no edema.   Discussed ways pt could increase intake, including small, frequent meals. Pt family agreeable to supplements; will continue  Ensure and add Magic Cup.   Labs reviewed: Na: 150, K: 3.3.   Diet Order:  Diet regular Room service appropriate? Yes; Fluid consistency: Thin  Skin:  Reviewed, no issues  Last BM:  PTA  Height:   Ht Readings from Last 1 Encounters:  02/23/16 4\' 11"  (1.499 m)    Weight:   Wt Readings from Last 1 Encounters:  03/15/16 94 lb 12.8 oz (43 kg)    Ideal Body Weight:  44.5 kg  BMI:  Body mass index is 19.15 kg/m.  Estimated Nutritional Needs:   Kcal:  1100-1300  Protein:  45-60 grams  Fluid:  > 1 L  EDUCATION NEEDS:   Education needs addressed  Octavion Mollenkopf A. Mayford KnifeWilliams, RD, LDN, CDE Pager: [phone] After hours Pager: [phone]

## 2016-03-13 DIAGNOSIS — E870 Hyperosmolality and hypernatremia: Secondary | ICD-10-CM | POA: Diagnosis not present

## 2016-03-13 DIAGNOSIS — R2681 Unsteadiness on feet: Secondary | ICD-10-CM | POA: Diagnosis not present

## 2016-03-13 DIAGNOSIS — I10 Essential (primary) hypertension: Secondary | ICD-10-CM | POA: Diagnosis not present

## 2016-03-13 DIAGNOSIS — R633 Feeding difficulties: Secondary | ICD-10-CM | POA: Diagnosis not present

## 2016-03-13 DIAGNOSIS — K5909 Other constipation: Secondary | ICD-10-CM | POA: Diagnosis present

## 2016-03-13 DIAGNOSIS — R630 Anorexia: Secondary | ICD-10-CM | POA: Diagnosis present

## 2016-03-13 DIAGNOSIS — M8580 Other specified disorders of bone density and structure, unspecified site: Secondary | ICD-10-CM | POA: Diagnosis present

## 2016-03-13 DIAGNOSIS — Z8711 Personal history of peptic ulcer disease: Secondary | ICD-10-CM | POA: Diagnosis not present

## 2016-03-13 DIAGNOSIS — K8020 Calculus of gallbladder without cholecystitis without obstruction: Secondary | ICD-10-CM | POA: Diagnosis not present

## 2016-03-13 DIAGNOSIS — R638 Other symptoms and signs concerning food and fluid intake: Secondary | ICD-10-CM | POA: Diagnosis not present

## 2016-03-13 DIAGNOSIS — F05 Delirium due to known physiological condition: Secondary | ICD-10-CM | POA: Diagnosis present

## 2016-03-13 DIAGNOSIS — N179 Acute kidney failure, unspecified: Secondary | ICD-10-CM | POA: Diagnosis not present

## 2016-03-13 DIAGNOSIS — K259 Gastric ulcer, unspecified as acute or chronic, without hemorrhage or perforation: Secondary | ICD-10-CM | POA: Diagnosis not present

## 2016-03-13 DIAGNOSIS — I272 Pulmonary hypertension, unspecified: Secondary | ICD-10-CM | POA: Diagnosis not present

## 2016-03-13 DIAGNOSIS — R339 Retention of urine, unspecified: Secondary | ICD-10-CM | POA: Diagnosis present

## 2016-03-13 DIAGNOSIS — Z452 Encounter for adjustment and management of vascular access device: Secondary | ICD-10-CM | POA: Diagnosis not present

## 2016-03-13 DIAGNOSIS — R1310 Dysphagia, unspecified: Secondary | ICD-10-CM | POA: Diagnosis not present

## 2016-03-13 DIAGNOSIS — K219 Gastro-esophageal reflux disease without esophagitis: Secondary | ICD-10-CM | POA: Diagnosis present

## 2016-03-13 DIAGNOSIS — R627 Adult failure to thrive: Secondary | ICD-10-CM | POA: Diagnosis present

## 2016-03-13 DIAGNOSIS — I420 Dilated cardiomyopathy: Secondary | ICD-10-CM | POA: Diagnosis not present

## 2016-03-13 DIAGNOSIS — R410 Disorientation, unspecified: Secondary | ICD-10-CM | POA: Diagnosis not present

## 2016-03-13 DIAGNOSIS — D649 Anemia, unspecified: Secondary | ICD-10-CM | POA: Diagnosis present

## 2016-03-13 DIAGNOSIS — G9340 Encephalopathy, unspecified: Secondary | ICD-10-CM | POA: Diagnosis not present

## 2016-03-13 DIAGNOSIS — R279 Unspecified lack of coordination: Secondary | ICD-10-CM | POA: Diagnosis not present

## 2016-03-13 DIAGNOSIS — K5900 Constipation, unspecified: Secondary | ICD-10-CM | POA: Diagnosis not present

## 2016-03-13 DIAGNOSIS — D72829 Elevated white blood cell count, unspecified: Secondary | ICD-10-CM | POA: Diagnosis present

## 2016-03-13 DIAGNOSIS — E1151 Type 2 diabetes mellitus with diabetic peripheral angiopathy without gangrene: Secondary | ICD-10-CM | POA: Diagnosis present

## 2016-03-13 DIAGNOSIS — E876 Hypokalemia: Secondary | ICD-10-CM | POA: Diagnosis present

## 2016-03-13 DIAGNOSIS — R6881 Early satiety: Secondary | ICD-10-CM | POA: Diagnosis present

## 2016-03-13 DIAGNOSIS — F0390 Unspecified dementia without behavioral disturbance: Secondary | ICD-10-CM | POA: Diagnosis present

## 2016-03-13 DIAGNOSIS — R5381 Other malaise: Secondary | ICD-10-CM | POA: Diagnosis not present

## 2016-03-13 DIAGNOSIS — E785 Hyperlipidemia, unspecified: Secondary | ICD-10-CM | POA: Diagnosis present

## 2016-03-13 DIAGNOSIS — S0990XA Unspecified injury of head, initial encounter: Secondary | ICD-10-CM | POA: Diagnosis not present

## 2016-03-13 DIAGNOSIS — I482 Chronic atrial fibrillation: Secondary | ICD-10-CM | POA: Diagnosis not present

## 2016-03-13 DIAGNOSIS — E860 Dehydration: Secondary | ICD-10-CM | POA: Diagnosis not present

## 2016-03-13 DIAGNOSIS — R000 Tachycardia, unspecified: Secondary | ICD-10-CM | POA: Diagnosis present

## 2016-03-13 DIAGNOSIS — K5730 Diverticulosis of large intestine without perforation or abscess without bleeding: Secondary | ICD-10-CM | POA: Diagnosis present

## 2016-03-13 LAB — BASIC METABOLIC PANEL
Anion gap: 11 (ref 5–15)
BUN: 23 mg/dL — AB (ref 6–20)
CALCIUM: 9.6 mg/dL (ref 8.9–10.3)
CO2: 25 mmol/L (ref 22–32)
CREATININE: 1.54 mg/dL — AB (ref 0.44–1.00)
Chloride: 99 mmol/L — ABNORMAL LOW (ref 101–111)
GFR calc Af Amer: 34 mL/min — ABNORMAL LOW (ref >60)
GFR, EST NON AFRICAN AMERICAN: 29 mL/min — AB (ref >60)
Glucose, Bld: 139 mg/dL — ABNORMAL HIGH (ref 65–99)
POTASSIUM: 3.3 mmol/L — AB (ref 3.5–5.1)
SODIUM: 135 mmol/L (ref 135–145)

## 2016-03-13 LAB — GLUCOSE, CAPILLARY: Glucose-Capillary: 106 mg/dL — ABNORMAL HIGH (ref 65–99)

## 2016-03-13 LAB — URINE CULTURE: Culture: 20000 — AB

## 2016-03-13 LAB — CBC
HCT: 33.8 % — ABNORMAL LOW (ref 36.0–46.0)
Hemoglobin: 10.8 g/dL — ABNORMAL LOW (ref 12.0–15.0)
MCH: 29.3 pg (ref 26.0–34.0)
MCHC: 32 g/dL (ref 30.0–36.0)
MCV: 91.8 fL (ref 78.0–100.0)
PLATELETS: 415 10*3/uL — AB (ref 150–400)
RBC: 3.68 MIL/uL — AB (ref 3.87–5.11)
RDW: 16.3 % — AB (ref 11.5–15.5)
WBC: 11.8 10*3/uL — AB (ref 4.0–10.5)

## 2016-03-13 MED ORDER — POTASSIUM CHLORIDE CRYS ER 20 MEQ PO TBCR
40.0000 meq | EXTENDED_RELEASE_TABLET | Freq: Once | ORAL | Status: AC
  Administered 2016-03-13: 40 meq via ORAL
  Filled 2016-03-13: qty 2

## 2016-03-13 MED ORDER — BISACODYL 10 MG RE SUPP
10.0000 mg | Freq: Every day | RECTAL | Status: DC | PRN
  Administered 2016-03-13: 10 mg via RECTAL
  Filled 2016-03-13: qty 1

## 2016-03-13 MED ORDER — PHENOL 1.4 % MT LIQD: 1.0000 | OROMUCOSAL | Status: DC | PRN

## 2016-03-13 MED ORDER — LACTULOSE 10 GM/15ML PO SOLN
30.0000 g | Freq: Every day | ORAL | Status: DC
  Filled 2016-03-13 (×2): qty 45

## 2016-03-13 MED ORDER — BLISTEX MEDICATED EX OINT
TOPICAL_OINTMENT | CUTANEOUS | Status: DC | PRN
  Administered 2016-03-14: 10:00:00 via TOPICAL
  Filled 2016-03-13: qty 6.3

## 2016-03-13 MED ORDER — NYSTATIN 100000 UNIT/ML MT SUSP
5.0000 mL | Freq: Four times a day (QID) | OROMUCOSAL | Status: DC
  Administered 2016-03-13 – 2016-03-22 (×23): 500000 [IU] via ORAL
  Filled 2016-03-13 (×29): qty 5

## 2016-03-13 MED ORDER — DILTIAZEM HCL ER COATED BEADS 240 MG PO CP24: 240.0000 mg | ORAL_CAPSULE | Freq: Every day | ORAL | Status: DC

## 2016-03-13 NOTE — Assessment & Plan Note (Signed)
Ok for change miralax not working well to [employer] qhs otc

## 2016-03-13 NOTE — Progress Notes (Signed)
PROGRESS NOTE    Tracy [name]  ZOX:[id number] DOB: 25-Nov-1926 DOA: 03/11/2016 PCP: Oliver Barre, MD   Brief Narrative: : Tracy [name] is a 82 y.o. female with medical history significant for dementia, history of CVA, chronic atrial fibrillation not anticoagulated due to GI bleeding, hypertension, and peripheral vascular disease who presents to the emergency department at the direction of her PCP for evaluation of generalized weakness with food refusal and lab abnormalities. Patient is accompanied by her son and daughter who assist with the history. Patient reportedly lives with her son who is at the bedside and had been in her usual state until approximately 4 days ago when she seemed to be generally weak and was refusing food and drink. She has continued to exhibit increasing generalized weakness and continues to refuse any food or drink, and has not been taking her medications.    Assessment & Plan:   Principal Problem:   Acute kidney injury (HCC) Active Problems:   Essential hypertension   GERD   Constipation   General weakness   Atrial fibrillation with RVR (HCC)   Hypercalcemia   Chronic atrial fibrillation (HCC)   Gastric ulcer   Hypokalemia   Loss of appetite   Leukocytosis   AKI (acute kidney injury) (HCC)   Dehydration  1. Acute kidney injury  - SCr is 2.21 on admission, up from 0.77 two weeks prior  - improving with IV fluids, cr has decreased to 1.5.  -continue with IV fluids.   2. Hypokalemia  - Serum potassium is 2.8 on admission  - She was given 30 mEq IV potassium in ED; 1 g of magnesium was administered  -improving. Will give 40 meq times one.    3. Generalized weakness, loss of appetite - Patient was evaluated by PCP with normal TSH on 03/11/16 continue with IV fluids.  -check urine culture.  -treat constipation.   4. Constipation  - Dehydration and calcium supplements likely aggravating  - very small BM today after fleet enema./ will check  KUB, will order suppository as needed.  -continue with miralax BID -lactulose, dulcolax suppository .  -KUB with prominent amount of stool.   5. Chronic atrial fibrillation  - CHADS-VASc is at least 7 (age x2, gender, hx CVA x2) - She is not anticoagulated d/t GI bleeds, recent admission 2 weeks ago.  - continue with Cardizem., PRN lopressor ordered  -increase Cardizem dose.   6. GERD, gastric ulcer  - EGD from November 2017 with esophageal ulcer and gastric ulcer - Continue with protonix.   7. Leukocytosis  - WBC is 13,000 on admission without fever or apparent source of infection  - CXR clear and UA not suggestive of infection; no upper resp sxs and no vomiting or diarrhea  - Plan to culture if febrile, but watch off of abx for now  -follow urine culture.  -trending down.   8. Hypercalcemia; resolved.  - Serum calcium is 11.9 on admission in setting of marked dehydration and calcium supplements  - She is being fluid-resuscitated and calcium supplements will be held  - improving with fluids.     DVT prophylaxis: SCD due to recent GI bleed.  Code Status: full code.  Family Communication: sons at bed side.  Disposition Plan: home when stable.  Consultants:   none   Procedures: none   Antimicrobials: none    Subjective: Family think patient is doing better.  patient denies abdominal pain, had 2 small BM last night.    Objective:  Vitals:   03/13/16 0310 03/13/16 0511 03/13/16 0904 03/13/16 1215  BP:  140/83 (!) 148/76   Pulse:  (!) 103 90 (!) 117  Resp:  18    Temp:  98.5 F (36.9 C)    TempSrc:      SpO2:  98% 98%   Weight: 43 kg (94 lb 12.9 oz)       Intake/Output Summary (Last 24 hours) at 03/13/16 1307 Last data filed at 03/12/16 1835  Gross per 24 hour  Intake          1326.25 ml  Output                0 ml  Net          1326.25 ml   Filed Weights   03/12/16 0457 03/13/16 0310  Weight: 43 kg (94 lb 12.8 oz) 43 kg (94 lb 12.9 oz)     Examination:  General exam: Appears calm and comfortable  Respiratory system: Clear to auscultation. Respiratory effort normal. Cardiovascular system: S1 & S2 heard, RRR. No JVD, murmurs, rubs, gallops or clicks. No pedal edema. Gastrointestinal system: Abdomen is nondistended, soft and nontender. No organomegaly or masses felt. Normal bowel sounds heard. Central nervous system: Alert and oriented. No focal neurological deficits. Extremities: Symmetric 5 x 5 power. Skin: No rashes, lesions or ulcers Psychiatry: Judgement and insight appear normal. Mood & affect appropriate.     Data Reviewed: I have personally reviewed following labs and imaging studies  CBC:  Recent Labs Lab 03/11/16 1452 03/12/16 1104 03/13/16 0523  WBC 13.0* 11.0* 11.8*  HGB 11.3* 10.2* 10.8*  HCT 37.1 33.8* 33.8*  MCV 93.2 93.4 91.8  PLT 547* 482* 415*   Basic Metabolic Panel:  Recent Labs Lab 03/11/16 1203 03/11/16 1452 03/11/16 2340 03/12/16 0548 03/13/16 0523  NA 149* 144  --  150* 135  K 2.8* 2.8*  --  3.3* 3.3*  CL 103 101  --  110 99*  CO2 36* 32  --  28 25  GLUCOSE 136* 133*  --  126* 139*  BUN 42* 40*  --  32* 23*  CREATININE 2.14* 2.21*  --  1.87* 1.54*  CALCIUM 12.1* 11.9*  --  10.9* 9.6  MG  --   --  2.2 2.3  --    GFR: Estimated Creatinine Clearance: 17.8 mL/min (by C-G formula based on SCr of 1.54 mg/dL (H)). Liver Function Tests:  Recent Labs Lab 03/11/16 1203 03/11/16 1715 03/12/16 0548  AST 35 60* 46*  ALT 29 32 34  ALKPHOS 48 41 43  BILITOT 0.6 1.2 0.6  PROT 6.6 5.8* 6.3*  ALBUMIN 3.1* 2.9* 2.9*    Recent Labs Lab 03/11/16 1715  LIPASE <10*   No results for input(s): AMMONIA in the last 168 hours. Coagulation Profile: No results for input(s): INR, PROTIME in the last 168 hours. Cardiac Enzymes: No results for input(s): CKTOTAL, CKMB, CKMBINDEX, TROPONINI in the last 168 hours. BNP (last 3 results) No results for input(s): PROBNP in the last 8760  hours. HbA1C: No results for input(s): HGBA1C in the last 72 hours. CBG:  Recent Labs Lab 03/12/16 0821 03/13/16 0845  GLUCAP 105* 106*   Lipid Profile: No results for input(s): CHOL, HDL, LDLCALC, TRIG, CHOLHDL, LDLDIRECT in the last 72 hours. Thyroid Function Tests:  Recent Labs  03/11/16 1203  TSH 3.05   Anemia Panel: No results for input(s): VITAMINB12, FOLATE, FERRITIN, TIBC, IRON, RETICCTPCT in the last 72 hours. Sepsis  Labs:  Recent Labs Lab 03/11/16 2024  LATICACIDVEN 1.11    Recent Results (from the past 240 hour(s))  Urine culture     Status: None (Preliminary result)   Collection Time: 03/11/16  6:55 PM  Result Value Ref Range Status   Specimen Description URINE, RANDOM  Final   Special Requests NONE  Final   Culture CULTURE REINCUBATED FOR BETTER GROWTH  Final   Report Status PENDING  Incomplete         Radiology Studies: Dg Chest 2 View  Result Date: 03/11/2016 CLINICAL DATA:  82 year old female with increased weakness and decreased p.o. intake. Initial encounter. EXAM: CHEST  2 VIEW COMPARISON:  Chest radiographs 02/04/2016 and earlier. FINDINGS: Stable cardiomegaly and mediastinal contours. Shallower lung volumes on the lateral view today. Chronic calcified pleural plaques. Calcified aortic atherosclerosis. No pneumothorax or pulmonary edema. No pleural effusion suspected today. No definite acute pulmonary opacity. Stable visualized osseous structures. , including benign chondroid appearing sclerotic matrix in the proximal right humerus. IMPRESSION: 1. Stable cardiomegaly. No acute cardiopulmonary abnormality. Calcified pleural plaques. 2.  Calcified aortic atherosclerosis. Electronically Signed   By: Odessa FlemingH  Hall M.D.   On: 03/11/2016 18:38   Dg Abd Portable 1v  Result Date: 03/12/2016 CLINICAL DATA:  Constipation. EXAM: PORTABLE ABDOMEN - 1 VIEW COMPARISON:  10/01/2008. FINDINGS: Soft tissue structures are unremarkable. Prominent amount of stool noted  throughout the colon and rectum consistent with constipation. Aortoiliac stent grafts noted. Peripheral vascular calcification. Stable pelvic calcifications consistent with phleboliths.Degenerative changes lumbar spine and both hips. IMPRESSION: 1. Prominent amount stool noted throughout the colon and rectum consistent with constipation. No bowel distention. 2.  Aortoiliac stent graft noted.  Peripheral vascular disease . Electronically Signed   By: Maisie Fushomas  Register   On: 03/12/2016 16:55        Scheduled Meds: . aspirin  325 mg Oral Daily  . atorvastatin  5 mg Oral q1800  . [START ON 03/14/2016] diltiazem  240 mg Oral Daily  . feeding supplement (ENSURE ENLIVE)  237 mL Oral BID BM  . lactulose  30 g Oral Daily  . multivitamin  1 tablet Oral BID  . nystatin  5 mL Oral QID  . oxybutynin  5 mg Oral Daily  . pantoprazole  40 mg Oral QHS  . pentoxifylline  400 mg Oral q morning - 10a  . polyethylene glycol  17 g Oral BID  . senna  1 tablet Oral BID  . sodium chloride flush  3 mL Intravenous Q12H   Continuous Infusions: . dextrose 75 mL/hr at 03/13/16 0913     LOS: 0 days    Time spent: 35 minutes.     Alba Coryegalado, Zyrion Coey A, MD [facility] Pager [phone]  If 7PM-7AM, please contact night-coverage [email or web address] Password [email or web address] 03/13/2016, 1:07 PM

## 2016-03-13 NOTE — Assessment & Plan Note (Signed)
stable overall by history and exam, recent data reviewed with pt, and pt to continue medical treatment as before,  to f/u any worsening symptoms or concerns BP Readings from Last 3 Encounters:  03/13/16 130/79  03/11/16 118/64  03/04/16 116/72

## 2016-03-13 NOTE — Assessment & Plan Note (Signed)
Etiology unclear, general exam benign, cant r/o volume depletion, VSS, for labs today

## 2016-03-13 NOTE — Assessment & Plan Note (Signed)
Not worsening, but for UA r/o uti,  to f/u any worsening symptoms or concerns

## 2016-03-13 NOTE — Evaluation (Signed)
Physical Therapy Evaluation Patient Details Name: Tracy FinerOphelia W [name] MRN: [id number] DOB: 1927-02-24 Today's Date: 03/13/2016   History of Present Illness  is a [age] y.o. female with medical history significant for dementia, history of CVA, chronic atrial fibrillation not anticoagulated due to GI bleeding, hypertension, and peripheral vascular disease who presents to the emergency department at the direction of her PCP for evaluation of generalized weakness with food refusal. basic blood work revealed an acute kidney injury and critical hypokalemia.  Clinical Impression  Pt with decr strength, cognition and mobility since last hospitalization  Her son reports gradual decline in her mobility x 1 month, recent hospitalization, was receiving home nursing and therapies.  He appears confident they can provide needed assist upon discharge home and that her strength will return if she resumes eating.  Will continue to follow patient while on this venue of care to progress mobility.Therapy will continue to follow to assist with discharge planning and follow up recommendations.    Follow Up Recommendations Home health PT;Supervision/Assistance - 24 hour    Equipment Recommendations  None recommended by PT    Recommendations for Other Services       Precautions / Restrictions Precautions Precautions: Fall Restrictions Weight Bearing Restrictions: No      Mobility  Bed Mobility Overal bed mobility: Needs Assistance Bed Mobility: Supine to Sit     Supine to sit: +2 for physical assistance;Mod assist     General bed mobility comments: pt did not initiate mobility, participated in mobility once therapist initiated  Transfers Overall transfer level: Needs assistance Equipment used: None Transfers: Sit to/from BJ'sStand;Stand Pivot Transfers Sit to Stand: Max assist Stand pivot transfers: Mod assist;+2 physical assistance       General transfer comment: decr safety awareness, poor hand  placement  Ambulation/Gait                Stairs            Wheelchair Mobility    Modified Rankin (Stroke Patients Only)       Balance Overall balance assessment: Needs assistance Sitting-balance support: Bilateral upper extremity supported;Feet supported Sitting balance-Leahy Scale: Fair     Standing balance support: Bilateral upper extremity supported Standing balance-Leahy Scale: Zero                               Pertinent Vitals/Pain Pain Assessment: No/denies pain    Home Living Family/patient expects to be discharged to:: Private residence Living Arrangements: Children Available Help at Discharge: Family;Available 24 hours/day Type of Home: House Home Access: Level entry     Home Layout: One level Home Equipment: Bedside commode;Walker - 4 wheels;Walker - 2 wheels;Shower seat;Wheelchair - manual Additional Comments: lives with son, had HHPT and HHRN prior to admission    Prior Function Level of Independence: Needs assistance   Gait / Transfers Assistance Needed: assist with gait using RW x 40', used w/c for community access  ADL's / Homemaking Assistance Needed: family assist with bathing/dressing  Comments: gradual decline in strength and mobility x past month     Hand Dominance   Dominant Hand: Right    Extremity/Trunk Assessment   Upper Extremity Assessment: Generalized weakness           Lower Extremity Assessment: Generalized weakness      Cervical / Trunk Assessment: Kyphotic  Communication   Communication: No difficulties  Cognition Arousal/Alertness: Awake/alert Behavior During Therapy: Flat affect Overall Cognitive Status:  History of cognitive impairments - at baseline                 General Comments: she appears oriented to self only, difficulty with naming family members and recognizing them    General Comments General comments (skin integrity, edema, etc.): pt needs max encouragement from  therapist and family to incr participation    Exercises     Assessment/Plan    PT Assessment Patient needs continued PT services  PT Problem List Decreased strength;Decreased activity tolerance;Decreased balance;Decreased mobility;Decreased knowledge of use of DME;Decreased cognition;Decreased safety awareness          PT Treatment Interventions Gait training;Functional mobility training;Therapeutic activities;Therapeutic exercise;Balance training;Patient/family education;DME instruction;Neuromuscular re-education    PT Goals (Current goals can be found in the Care Plan section)  Acute Rehab PT Goals Patient Stated Goal: go home and get back into therapy PT Goal Formulation: With family Time For Goal Achievement: 03/26/16 Potential to Achieve Goals: Good    Frequency Min 3X/week   Barriers to discharge Decreased caregiver support pt's son expresses concerns about ability to get pt to bathroom in timely manner as she was recently incontinent at home x 1 week    Co-evaluation               End of Session Equipment Utilized During Treatment: Gait belt Activity Tolerance: Patient tolerated treatment well Patient left: in chair;with call bell/phone within reach;with family/visitor present      Functional Assessment Tool Used: bed mobility, transfer Functional Limitation: Mobility: Walking and moving around Mobility: Walking and Moving Around Current Status [phone]): At least 60 percent but less than 80 percent impaired, limited or restricted Mobility: Walking and Moving Around Goal Status [phone]): At least 40 percent but less than 60 percent impaired, limited or restricted    Time: [id number] PT Time Calculation (min) (ACUTE ONLY): 28 min   Charges:   PT Evaluation $PT Eval Moderate Complexity: 1 Procedure     PT G Codes:   PT G-Codes **NOT FOR INPATIENT CLASS** Functional Assessment Tool Used: bed mobility, transfer Functional Limitation: Mobility: Walking and  moving around Mobility: Walking and Moving Around Current Status ([id number]): At least 60 percent but less than 80 percent impaired, limited or restricted Mobility: Walking and Moving Around Goal Status [phone]): At least 40 percent but less than 60 percent impaired, limited or restricted   Nestor LewandowskyKristen M Ranferi [place], [place] CarolinaPT [phone]  Tracy [name] 03/13/2016, 3:47 PM

## 2016-03-14 ENCOUNTER — Inpatient Hospital Stay (HOSPITAL_COMMUNITY): Payer: Medicare Other

## 2016-03-14 LAB — BASIC METABOLIC PANEL
Anion gap: 10 (ref 5–15)
BUN: 19 mg/dL (ref 6–20)
CALCIUM: 9.2 mg/dL (ref 8.9–10.3)
CO2: 24 mmol/L (ref 22–32)
CREATININE: 1.47 mg/dL — AB (ref 0.44–1.00)
Chloride: 101 mmol/L (ref 101–111)
GFR calc Af Amer: 36 mL/min — ABNORMAL LOW (ref >60)
GFR calc non Af Amer: 31 mL/min — ABNORMAL LOW (ref >60)
GLUCOSE: 84 mg/dL (ref 65–99)
Potassium: 4.2 mmol/L (ref 3.5–5.1)
Sodium: 135 mmol/L (ref 135–145)

## 2016-03-14 LAB — CBC
HCT: 33.5 % — ABNORMAL LOW (ref 36.0–46.0)
Hemoglobin: 10.5 g/dL — ABNORMAL LOW (ref 12.0–15.0)
MCH: 28.4 pg (ref 26.0–34.0)
MCHC: 31.3 g/dL (ref 30.0–36.0)
MCV: 90.5 fL (ref 78.0–100.0)
Platelets: 453 10*3/uL — ABNORMAL HIGH (ref 150–400)
RBC: 3.7 MIL/uL — ABNORMAL LOW (ref 3.87–5.11)
RDW: 15.8 % — AB (ref 11.5–15.5)
WBC: 14 10*3/uL — ABNORMAL HIGH (ref 4.0–10.5)

## 2016-03-14 LAB — GLUCOSE, CAPILLARY: Glucose-Capillary: 97 mg/dL (ref 65–99)

## 2016-03-14 MED ORDER — PANTOPRAZOLE SODIUM 40 MG PO PACK: 40.0000 mg | PACK | Freq: Every day | ORAL | Status: DC

## 2016-03-14 MED ORDER — PANTOPRAZOLE SODIUM 40 MG PO PACK
40.0000 mg | PACK | Freq: Two times a day (BID) | ORAL | Status: DC
  Administered 2016-03-14 – 2016-03-22 (×12): 40 mg via ORAL
  Filled 2016-03-14 (×13): qty 20

## 2016-03-14 MED ORDER — DILTIAZEM HCL ER COATED BEADS 180 MG PO CP24
180.0000 mg | ORAL_CAPSULE | Freq: Every day | ORAL | Status: DC
  Administered 2016-03-14: 180 mg via ORAL
  Filled 2016-03-14: qty 1

## 2016-03-14 MED ORDER — DILTIAZEM 12 MG/ML ORAL SUSPENSION
45.0000 mg | Freq: Four times a day (QID) | ORAL | Status: DC
  Administered 2016-03-14 – 2016-03-19 (×18): 45 mg via ORAL
  Filled 2016-03-14 (×20): qty 6

## 2016-03-14 MED ORDER — OXYBUTYNIN CHLORIDE 5 MG/5ML PO SYRP
2.5000 mg | ORAL_SOLUTION | Freq: Two times a day (BID) | ORAL | Status: DC
  Administered 2016-03-14 – 2016-03-20 (×9): 2.5 mg via ORAL
  Filled 2016-03-14 (×15): qty 2.5

## 2016-03-14 NOTE — Progress Notes (Signed)
PROGRESS NOTE    Tracy [name]  [name]:[id number] DOB: 11/12/1926 DOA: 03/11/2016 PCP: Oliver BarreJames John, MD   Brief Narrative: : Tracy [name] is a 82 y.o. female with medical history significant for dementia, history of CVA, chronic atrial fibrillation not anticoagulated due to GI bleeding, hypertension, and peripheral vascular disease who presents to the emergency department at the direction of her PCP for evaluation of generalized weakness with food refusal and lab abnormalities. Patient is accompanied by her son and daughter who assist with the history. Patient reportedly lives with her son who is at the bedside and had been in her usual state until approximately 4 days ago when she seemed to be generally weak and was refusing food and drink. She has continued to exhibit increasing generalized weakness and continues to refuse any food or drink, and has not been taking her medications.    Assessment & Plan:   Principal Problem:   Acute kidney injury (HCC) Active Problems:   Essential hypertension   GERD   Constipation   General weakness   Atrial fibrillation with RVR (HCC)   Hypercalcemia   Chronic atrial fibrillation (HCC)   Gastric ulcer   Hypokalemia   Loss of appetite   Leukocytosis   AKI (acute kidney injury) (HCC)   Dehydration  1. Acute kidney injury  - SCr is 2.21 on admission, up from 0.77 two weeks prior  - improving with IV fluids, cr has decreased to 1.4.  -continue with IV fluids. Improved.   2. Hypokalemia  - Serum potassium is 2.8 on admission  - She was given 30 mEq IV potassium in ED; 1 g of magnesium was administered  -resolved.    3. Generalized weakness, loss of appetite, early satiety  - Patient was evaluated by PCP with normal TSH on 03/11/16 continue with IV fluids.  -urine culture no growth.  -treating  constipation.  -will consult GI, recent gastric ulcer, ? Patient complaining now of early Satiety   4. Constipation  - Dehydration and calcium  supplements likely aggravating  - very small BM today after fleet enema./ will check KUB, will order suppository as needed.  -continue with miralax BID -lactulose, dulcolax suppository .  -KUB with prominent amount of stool.  Had 3 BM overnight .  Repeat KUB.   5. Chronic atrial fibrillation  - CHADS-VASc is at least 305 (age x2, gender, hx CVA x2) - She is not anticoagulated d/t GI bleeds, recent admission 2 weeks ago.  - continue with Cardizem., PRN lopressor ordered  -increase Cardizem dose.   6. GERD, gastric ulcer  - EGD from November 2017 with esophageal ulcer and gastric ulcer - Continue with protonix.   7. Leukocytosis  - WBC is 13,000 on admission without fever or apparent source of infection  - CXR clear and UA not suggestive of infection; no upper resp sxs and no vomiting or diarrhea  -WBC fluctuates.   8. Hypercalcemia; resolved.  - Serum calcium is 11.9 on admission in setting of marked dehydration and calcium supplements  - She is being fluid-resuscitated and calcium supplements will be held  - improving with fluids.     DVT prophylaxis: SCD due to recent GI bleed.  Code Status: full code.  Family Communication: sons at bed side.  Disposition Plan: home when stable.  Consultants:   none   Procedures: none   Antimicrobials: none    Subjective: Patient still not eating. Now report early satiety.  Per nurse report patient had 3 BM  overnight    Objective: Vitals:   03/13/16 1503 03/13/16 2104 03/14/16 0449 03/14/16 0504  BP: 130/79 (!) 145/75 (!) 165/85   Pulse: 67 62 68   Resp:  18 19   Temp: 97.5 F (36.4 C) 97.6 F (36.4 C) 97.2 F (36.2 C)   TempSrc: Oral Oral Oral   SpO2: (!) 83% 99% 98%   Weight:    43.1 kg (95 lb)    Intake/Output Summary (Last 24 hours) at 03/14/16 1130 Last data filed at 03/14/16 0648  Gross per 24 hour  Intake              885 ml  Output                0 ml  Net              885 ml   Filed Weights    03/12/16 0457 03/13/16 0310 03/14/16 0504  Weight: 43 kg (94 lb 12.8 oz) 43 kg (94 lb 12.9 oz) 43.1 kg (95 lb)    Examination:  General exam: Appears calm and comfortable  Respiratory system: Clear to auscultation. Respiratory effort normal. Cardiovascular system: S1 & S2 heard, RRR. No JVD, murmurs, rubs, gallops or clicks. No pedal edema. Gastrointestinal system: Abdomen is nondistended, soft and nontender. No organomegaly or masses felt. Normal bowel sounds heard. Central nervous system: Alert and oriented. No focal neurological deficits. Extremities: Symmetric 5 x 5 power. Skin: No rashes, lesions or ulcers Psychiatry: Judgement and insight appear normal. Mood & affect appropriate.     Data Reviewed: I have personally reviewed following labs and imaging studies  CBC:  Recent Labs Lab 03/11/16 1452 03/12/16 1104 03/13/16 0523 03/14/16 0440  WBC 13.0* 11.0* 11.8* 14.0*  HGB 11.3* 10.2* 10.8* 10.5*  HCT 37.1 33.8* 33.8* 33.5*  MCV 93.2 93.4 91.8 90.5  PLT 547* 482* 415* 453*   Basic Metabolic Panel:  Recent Labs Lab 03/11/16 1203 03/11/16 1452 03/11/16 2340 03/12/16 0548 03/13/16 0523 03/14/16 0440  NA 149* 144  --  150* 135 135  K 2.8* 2.8*  --  3.3* 3.3* 4.2  CL 103 101  --  110 99* 101  CO2 36* 32  --  28 25 24   GLUCOSE 136* 133*  --  126* 139* 84  BUN 42* 40*  --  32* 23* 19  CREATININE 2.14* 2.21*  --  1.87* 1.54* 1.47*  CALCIUM 12.1* 11.9*  --  10.9* 9.6 9.2  MG  --   --  2.2 2.3  --   --    GFR: Estimated Creatinine Clearance: 18.7 mL/min (by C-G formula based on SCr of 1.47 mg/dL (H)). Liver Function Tests:  Recent Labs Lab 03/11/16 1203 03/11/16 1715 03/12/16 0548  AST 35 60* 46*  ALT 29 32 34  ALKPHOS 48 41 43  BILITOT 0.6 1.2 0.6  PROT 6.6 5.8* 6.3*  ALBUMIN 3.1* 2.9* 2.9*    Recent Labs Lab 03/11/16 1715  LIPASE <10*   No results for input(s): AMMONIA in the last 168 hours. Coagulation Profile: No results for input(s): INR,  PROTIME in the last 168 hours. Cardiac Enzymes: No results for input(s): CKTOTAL, CKMB, CKMBINDEX, TROPONINI in the last 168 hours. BNP (last 3 results) No results for input(s): PROBNP in the last 8760 hours. HbA1C: No results for input(s): HGBA1C in the last 72 hours. CBG:  Recent Labs Lab 03/12/16 0821 03/13/16 0845 03/14/16 0817  GLUCAP 105* 106* 97   Lipid Profile: No  results for input(s): CHOL, HDL, LDLCALC, TRIG, CHOLHDL, LDLDIRECT in the last 72 hours. Thyroid Function Tests:  Recent Labs  03/11/16 1203  TSH 3.05   Anemia Panel: No results for input(s): VITAMINB12, FOLATE, FERRITIN, TIBC, IRON, RETICCTPCT in the last 72 hours. Sepsis Labs:  Recent Labs Lab 03/11/16 2024  LATICACIDVEN 1.11    Recent Results (from the past 240 hour(s))  Urine culture     Status: Abnormal   Collection Time: 03/11/16  6:55 PM  Result Value Ref Range Status   Specimen Description URINE, RANDOM  Final   Special Requests NONE  Final   Culture (A)  Final    20,000 COLONIES/mL LACTOBACILLUS SPECIES Standardized susceptibility testing for this organism is not available.    Report Status 03/13/2016 FINAL  Final         Radiology Studies: Dg Abd Portable 1v  Result Date: 03/12/2016 CLINICAL DATA:  Constipation. EXAM: PORTABLE ABDOMEN - 1 VIEW COMPARISON:  10/01/2008. FINDINGS: Soft tissue structures are unremarkable. Prominent amount of stool noted throughout the colon and rectum consistent with constipation. Aortoiliac stent grafts noted. Peripheral vascular calcification. Stable pelvic calcifications consistent with phleboliths.Degenerative changes lumbar spine and both hips. IMPRESSION: 1. Prominent amount stool noted throughout the colon and rectum consistent with constipation. No bowel distention. 2.  Aortoiliac stent graft noted.  Peripheral vascular disease . Electronically Signed   By: Maisie Fushomas  Register   On: 03/12/2016 16:55        Scheduled Meds: . aspirin  325 mg  Oral Daily  . atorvastatin  5 mg Oral q1800  . diltiazem  45 mg Oral Q6H  . feeding supplement (ENSURE ENLIVE)  237 mL Oral BID BM  . lactulose  30 g Oral Daily  . multivitamin  1 tablet Oral BID  . nystatin  5 mL Oral QID  . oxybutynin  2.5 mg Oral BID  . pantoprazole sodium  40 mg Oral Daily  . pentoxifylline  400 mg Oral q morning - 10a  . polyethylene glycol  17 g Oral BID  . senna  1 tablet Oral BID  . sodium chloride flush  3 mL Intravenous Q12H   Continuous Infusions: . dextrose 75 mL/hr at 03/14/16 0341     LOS: 1 day    Time spent: 35 minutes.     Alba Coryegalado, Khalee Mazo A, MD [facility] Pager [phone]  If 7PM-7AM, please contact night-coverage [email or web address] Password [email or web address] 03/14/2016, 11:30 AM

## 2016-03-14 NOTE — Consult Note (Signed)
Referring Provider:  TH Primary Care Physician:  Oliver Barre, MD Primary Gastroenterologist:  Gentry Fitz patient only seen by [facility] as inpatient.  Reason for Consultation:  Nausea and poor oral intake  HPI: Tracy [name] is a 82 y.o. female with multiple medical comorbidities including dementia, history of CVA, chronic atrial fibrillation, peripheral was current disease admitted to the hospital with weakness and poor oral intake. GI is consulted for further evaluation. Upon initial evaluation, patient is found to have acute kidney injury along with hypokalemia. Abdominal x-ray showed constipation. Patient with ongoing leukocytosis without any source of infection.  Patient is currently lethargic. Noticed burning, and. History obtained from daughter. According to daughter, patients  was C/O sore throat and decreased appetite. Patient with chronic constipation. With repeat x-ray this morning showed constipation. According to daughter, she does not have any black stool or bright blood per rectum.  Patient was seen last month for GI bleed while on Eliquis. Underwent EGD which showed large gastric ulcer as well as esophageal ulcer. Biopsy was negative for H. pylori as well as for malignancy  Past Medical History:  Diagnosis Date  . Atrial fibrillation (HCC)   . CAROTID STENOSIS   . CVA (cerebrovascular accident) (HCC) 2002  . DIABETES MELLITUS, TYPE II   . Diverticulosis of colon   . DIVERTICULOSIS, COLON   . DM (diabetes mellitus), type 2 (HCC)   . DVT (deep venous thrombosis) (HCC)   . GERD   . GERD (gastroesophageal reflux disease)   . HTN (hypertension)   . Hx of adenomatous colonic polyps 2007  . Hyperlipidemia   . HYPERLIPIDEMIA   . Hypertension   . Osteopenia   . OSTEOPENIA   . PVD (peripheral vascular disease) (HCC)    recently dx w/ carotid -left  int carotid >90% 50-60% rt int carotid 02/2010  . PVD (peripheral vascular disease) (HCC)    lf  ABI 0.11 and rt 0.43  . Sinus  node dysfunction (HCC) 2011  . Stroke [facility]) July 01, 2014   Affected Left side  . Upper GI bleed 02/2016    Past Surgical History:  Procedure Laterality Date  . CAROTID ENDARTERECTOMY    . cataract eye surgery    . CNS vascular stent per Neurosurgeon    . ESOPHAGOGASTRODUODENOSCOPY (EGD) WITH PROPOFOL N/A 02/24/2016   Procedure: ESOPHAGOGASTRODUODENOSCOPY (EGD) WITH PROPOFOL;  Surgeon: Kathi Der, MD;  Location: [facility] ENDOSCOPY;  Service: Gastroenterology;  Laterality: N/A;  . EYE SURGERY    . ILIAC ARTERY STENT  2011   Bilateral CIA stent  . lazer eye surgery    . Right Shoulder Surgery  2010  . TUBAL LIGATION      Prior to Admission medications   Medication Sig Start Date End Date Taking? Authorizing Provider  aspirin 325 MG tablet Take 325 mg by mouth daily.   Yes Historical Provider, MD  atorvastatin (LIPITOR) 10 MG tablet 1/2 tab by mouth daily Patient taking differently: Take 5 mg by mouth daily at 6 PM. 1/2 tab by mouth daily 11/18/15  Yes Corwin Levins, MD  Calcium Carbonate (CALTRATE 600) 1500 MG TABS Take 1 tablet by mouth 2 (two) times daily.    Yes Historical Provider, MD  diltiazem (CARDIZEM CD) 180 MG 24 hr capsule Take 1 capsule (180 mg total) by mouth daily. 02/26/16  Yes Lonia Blood, MD  feeding supplement, ENSURE ENLIVE, (ENSURE ENLIVE) LIQD Take 237 mLs by mouth 2 (two) times daily between meals. 02/07/16  Yes Rayna Sexton  A Nettey, MD  fenofibrate micronized (LOFIBRA) 134 MG capsule TAKE 1 CAPSULE (134 MG TOTAL) BY MOUTH DAILY. 04/17/15  Yes Corwin LevinsJames W John, MD  Multiple Vitamin (MULTIVITAMIN) capsule Take 1 capsule by mouth daily. Centrum Silver   Yes Historical Provider, MD  Multiple Vitamins-Minerals (PRESERVISION AREDS 2) CAPS Take 1 capsule by mouth 2 (two) times daily.   Yes Historical Provider, MD  oxybutynin (DITROPAN-XL) 5 MG 24 hr tablet TAKE 1 TABLET BY MOUTH EVERY DAY 10/27/15  Yes Corwin LevinsJames W John, MD  pantoprazole (PROTONIX) 40 MG tablet Take 1 tablet (40 mg  total) by mouth 2 (two) times daily before a meal. 02/26/16  Yes Lonia BloodJeffrey T McClung, MD  pentoxifylline (TRENTAL) 400 MG CR tablet TAKE 1 TABLET (400 MG TOTAL) BY MOUTH DAILY. 04/17/15  Yes Corwin LevinsJames W John, MD  gabapentin (NEURONTIN) 100 MG capsule Take 2 capsules (200 mg total) by mouth 3 (three) times daily. Patient not taking: Reported on 03/11/2016 02/07/16   Narda Bondsalph A Nettey, MD  HYDROcodone-acetaminophen (NORCO/VICODIN) 5-325 MG tablet Take 1 tablet by mouth every 6 (six) hours as needed for moderate pain. Patient not taking: Reported on 03/11/2016 02/12/16   Corwin LevinsJames W John, MD    Scheduled Meds: . aspirin  325 mg Oral Daily  . atorvastatin  5 mg Oral q1800  . diltiazem  45 mg Oral Q6H  . feeding supplement (ENSURE ENLIVE)  237 mL Oral BID BM  . lactulose  30 g Oral Daily  . multivitamin  1 tablet Oral BID  . nystatin  5 mL Oral QID  . oxybutynin  2.5 mg Oral BID  . pantoprazole sodium  40 mg Oral Daily  . pentoxifylline  400 mg Oral q morning - 10a  . polyethylene glycol  17 g Oral BID  . senna  1 tablet Oral BID  . sodium chloride flush  3 mL Intravenous Q12H   Continuous Infusions: . dextrose 75 mL/hr at 03/14/16 0341   PRN Meds:.acetaminophen **OR** acetaminophen, bisacodyl, HYDROcodone-acetaminophen, lip balm, metoprolol, ondansetron **OR** ondansetron (ZOFRAN) IV, phenol  Allergies as of 03/11/2016  . (No Known Allergies)    Family History  Problem Relation Age of Onset  . Cancer Mother     colon  . Diabetes Sister     Amputation    Social History   Social History  . Marital status: Widowed    Spouse name: N/A  . Number of children: 5  . Years of education: N/A   Occupational History  . retired [employer]   Social History Main Topics  . Smoking status: Former Games developermoker  . Smokeless tobacco: Never Used     Comment: QUIT IN [id number]  . Alcohol use No  . Drug use: No  . Sexual activity: Not on file   Other Topics Concern  . Not on file   Social History Narrative   **  Merged History Encounter **        Review of Systems: Unable to obtain  Physical Exam: Vital signs: Vitals:   03/13/16 2104 03/14/16 0449  BP: (!) 145/75 (!) 165/85  Pulse: 62 68  Resp: 18 19  Temp: 97.6 F (36.4 C) 97.2 F (36.2 C)   Last BM Date: 03/13/16 General:   Patient is lethargic, not responding to verbal command. Not opening her eyes or mouth. Oral cavity-not able to examine Eyes-unable to examine Lungs:  Clear throughout to auscultation.   No wheezes, crackles, or rhonchi. No acute distress. Heart:  Regular rate and rhythm; no  murmurs, clicks, rubs,  or gallops. Abdomen: Soft, nontender, nondistended bowel sounds present LE :  No edema   GI:  Lab Results:  Recent Labs  03/12/16 1104 03/13/16 0523 03/14/16 0440  WBC 11.0* 11.8* 14.0*  HGB 10.2* 10.8* 10.5*  HCT 33.8* 33.8* 33.5*  PLT 482* 415* 453*   BMET  Recent Labs  03/12/16 0548 03/13/16 0523 03/14/16 0440  NA 150* 135 135  K 3.3* 3.3* 4.2  CL 110 99* 101  CO2 28 25 24   GLUCOSE 126* 139* 84  BUN 32* 23* 19  CREATININE 1.87* 1.54* 1.47*  CALCIUM 10.9* 9.6 9.2   LFT  Recent Labs  03/11/16 1715 03/12/16 0548  PROT 5.8* 6.3*  ALBUMIN 2.9* 2.9*  AST 60* 46*  ALT 32 34  ALKPHOS 41 43  BILITOT 1.2 0.6  BILIDIR 0.5  --   IBILI 0.7  --    PT/INR No results for input(s): LABPROT, INR in the last 72 hours.   Studies/Results: Dg Abd Portable 1v  Result Date: 03/12/2016 CLINICAL DATA:  Constipation. EXAM: PORTABLE ABDOMEN - 1 VIEW COMPARISON:  10/01/2008. FINDINGS: Soft tissue structures are unremarkable. Prominent amount of stool noted throughout the colon and rectum consistent with constipation. Aortoiliac stent grafts noted. Peripheral vascular calcification. Stable pelvic calcifications consistent with phleboliths.Degenerative changes lumbar spine and both hips. IMPRESSION: 1. Prominent amount stool noted throughout the colon and rectum consistent with constipation. No bowel  distention. 2.  Aortoiliac stent graft noted.  Peripheral vascular disease . Electronically Signed   By: Maisie Fushomas  Register   On: 03/12/2016 16:55    Impression/Plan: - Poor oral intake. Probably multifactorial - Recent GI bleed/hematemesis  With EGD 02/24/2016 showing large gastric ulcer as well as esophageal ulcer. Biopsies negative for H. pylori and malignancy - Altered mental status - Leukocytosis - Acute kidney injury. Improving - Constipation. Last bowel movement this morning  Recommendations -------------------------- - Discuss with the nursing staff as well as primary attending Dr. as patient is not responding to verbal command - Recommend CT abdomen with IV and PO contrast once acute kidney injury is resolved and once  patient is more responsive to rule out any underlying malignancy given her decreased appetite - Change PPI to twice a day - Discuss in detail with the daughter. At this time, they do not have any long-term care plan. - Would hold off on any endoscopic intervention for now - GI will follow.   LOS: 1 day   Kathi DerParag Troy Hartzog  MD, FACP 03/14/2016, 12:29 PM  Pager [phone] If no answer or after 5 PM call [phone]

## 2016-03-15 ENCOUNTER — Inpatient Hospital Stay (HOSPITAL_COMMUNITY): Payer: Medicare Other

## 2016-03-15 LAB — CBC
HCT: 32.9 % — ABNORMAL LOW (ref 36.0–46.0)
Hemoglobin: 10.3 g/dL — ABNORMAL LOW (ref 12.0–15.0)
MCH: 28.1 pg (ref 26.0–34.0)
MCHC: 31.3 g/dL (ref 30.0–36.0)
MCV: 89.6 fL (ref 78.0–100.0)
PLATELETS: 372 10*3/uL (ref 150–400)
RBC: 3.67 MIL/uL — AB (ref 3.87–5.11)
RDW: 15.9 % — ABNORMAL HIGH (ref 11.5–15.5)
WBC: 14 10*3/uL — AB (ref 4.0–10.5)

## 2016-03-15 LAB — BASIC METABOLIC PANEL
ANION GAP: 12 (ref 5–15)
BUN: 14 mg/dL (ref 6–20)
CO2: 23 mmol/L (ref 22–32)
Calcium: 8.4 mg/dL — ABNORMAL LOW (ref 8.9–10.3)
Chloride: 102 mmol/L (ref 101–111)
Creatinine, Ser: 1.21 mg/dL — ABNORMAL HIGH (ref 0.44–1.00)
GFR calc non Af Amer: 39 mL/min — ABNORMAL LOW (ref >60)
GFR, EST AFRICAN AMERICAN: 46 mL/min — AB (ref >60)
Glucose, Bld: 111 mg/dL — ABNORMAL HIGH (ref 65–99)
Potassium: 3.2 mmol/L — ABNORMAL LOW (ref 3.5–5.1)
SODIUM: 137 mmol/L (ref 135–145)

## 2016-03-15 LAB — GLUCOSE, CAPILLARY: GLUCOSE-CAPILLARY: 117 mg/dL — AB (ref 65–99)

## 2016-03-15 LAB — VITAMIN B12: VITAMIN B 12: 1029 pg/mL — AB (ref 180–914)

## 2016-03-15 MED ORDER — DEXTROSE 5 % IV SOLN
1.0000 g | INTRAVENOUS | Status: DC
  Administered 2016-03-15: 1 g via INTRAVENOUS
  Filled 2016-03-15 (×2): qty 10

## 2016-03-15 MED ORDER — IOPAMIDOL (ISOVUE-300) INJECTION 61%
INTRAVENOUS | Status: AC
  Administered 2016-03-15: 30 mL
  Filled 2016-03-15: qty 75

## 2016-03-15 MED ORDER — IOPAMIDOL (ISOVUE-300) INJECTION 61%
INTRAVENOUS | Status: AC
  Administered 2016-03-15: 30 mL
  Filled 2016-03-15: qty 30

## 2016-03-15 MED ORDER — POTASSIUM CHLORIDE CRYS ER 20 MEQ PO TBCR
40.0000 meq | EXTENDED_RELEASE_TABLET | Freq: Once | ORAL | Status: AC
  Administered 2016-03-15: 40 meq via ORAL
  Filled 2016-03-15: qty 2

## 2016-03-15 NOTE — Progress Notes (Signed)
PROGRESS NOTE    Tracy [name]  [name]:[id number]:[id number] DOB: 1927/02/04 DOA: 03/11/2016 PCP: Oliver BarreJames John, MD   Brief Narrative: : Tracy [name] is a 82 y.o. female with medical history significant for dementia, history of CVA, chronic atrial fibrillation not anticoagulated due to GI bleeding, hypertension, and peripheral vascular disease who presents to the emergency department at the direction of her PCP for evaluation of generalized weakness with food refusal and lab abnormalities. Patient is accompanied by her son and daughter who assist with the history. Patient reportedly lives with her son who is at the bedside and had been in her usual state until approximately 4 days ago when she seemed to be generally weak and was refusing food and drink. She has continued to exhibit increasing generalized weakness and continues to refuse any food or drink, and has not been taking her medications.    Assessment & Plan:   Principal Problem:   Acute kidney injury (HCC) Active Problems:   Essential hypertension   GERD   Constipation   General weakness   Atrial fibrillation with RVR (HCC)   Hypercalcemia   Chronic atrial fibrillation (HCC)   Gastric ulcer   Hypokalemia   Loss of appetite   Leukocytosis   AKI (acute kidney injury) (HCC)   Dehydration  1. Acute kidney injury  - SCr is 2.21 on admission, up from 0.77 two weeks prior  - improving with IV fluids, cr has decreased to 1.2.  -continue with IV fluids. Improved.   2. Hypokalemia  - Serum potassium is 2.8 on admission  - will give one time dose of Kcl    3. Generalized weakness, loss of appetite, early satiety  - Patient was evaluated by PCP with normal TSH on 03/11/16 continue with IV fluids.  -urine culture no growth.  -treating  constipation.  -CT abdomen ordered.  -check B 12 and Vitamin D.   4. Constipation  - Dehydration and calcium supplements likely aggravating  - very small BM today after fleet enema./ will check  KUB, will order suppository as needed.  -continue with miralax BID -lactulose, dulcolax suppository .  -initial KUB with prominent amount of stool.    5. Chronic atrial fibrillation  - CHADS-VASc is at least 885 (age x2, gender, hx CVA x2) - She is not anticoagulated d/t GI bleeds, recent admission 2 weeks ago.  - continue with Cardizem., PRN lopressor ordered  -increase Cardizem dose.   6. GERD, gastric ulcer  - EGD from November 2017 with esophageal ulcer and gastric ulcer - Continue with protonix.   7. Leukocytosis  - WBC is 13,000 on admission without fever or apparent source of infection  - CXR clear and UA not suggestive of infection; no upper resp sxs and no vomiting or diarrhea  -WBC fluctuates.   8. Hypercalcemia; resolved.  - Serum calcium is 11.9 on admission in setting of marked dehydration and calcium supplements  - She is being fluid-resuscitated and calcium supplements will be held  - improving with fluids.   9-Leukocytosis, follow Ct abdomen. Added ceftriaxone. Urine with 6-30 WBC  DVT prophylaxis: SCD due to recent GI bleed.  Code Status: full code.  Family Communication: sons at bed side.  Disposition Plan: home when stable.  Consultants:   none   Procedures: none   Antimicrobials: none    Subjective: Still with poor oral intake, does not want to eat.  Had BM early this morning per nurse.    Objective: Vitals:   03/14/16 2229  03/14/16 2344 03/15/16 0500 03/15/16 0655  BP: (!) 140/97   (!) 141/81  Pulse: 85 (!) 108  76  Resp: 20   18  Temp:    98.1 F (36.7 C)  TempSrc:    Oral  SpO2: (!) 87% 99%  98%  Weight:   43 kg (94 lb 12.8 oz)     Intake/Output Summary (Last 24 hours) at 03/15/16 1326 Last data filed at 03/15/16 1310  Gross per 24 hour  Intake          1561.25 ml  Output                0 ml  Net          1561.25 ml   Filed Weights   03/13/16 0310 03/14/16 0504 03/15/16 0500  Weight: 43 kg (94 lb 12.9 oz) 43.1 kg (95 lb)  43 kg (94 lb 12.8 oz)    Examination:  General exam: Appears calm and comfortable  Respiratory system: Clear to auscultation. Respiratory effort normal. Cardiovascular system: S1 & S2 heard, RRR. No JVD, murmurs, rubs, gallops or clicks. No pedal edema. Gastrointestinal system: Abdomen is nondistended, soft and nontender. No organomegaly or masses felt. Normal bowel sounds heard. Central nervous system: Alert and oriented. No focal neurological deficits. Extremities: Symmetric 5 x 5 power. Skin: No rashes, lesions or ulcers Psychiatry: Judgement and insight appear normal. Mood & affect appropriate.     Data Reviewed: I have personally reviewed following labs and imaging studies  CBC:  Recent Labs Lab 03/11/16 1452 03/12/16 1104 03/13/16 0523 03/14/16 0440 03/15/16 0648  WBC 13.0* 11.0* 11.8* 14.0* 14.0*  HGB 11.3* 10.2* 10.8* 10.5* 10.3*  HCT 37.1 33.8* 33.8* 33.5* 32.9*  MCV 93.2 93.4 91.8 90.5 89.6  PLT 547* 482* 415* 453* 372   Basic Metabolic Panel:  Recent Labs Lab 03/11/16 1452 03/11/16 2340 03/12/16 0548 03/13/16 0523 03/14/16 0440 03/15/16 0648  NA 144  --  150* 135 135 137  K 2.8*  --  3.3* 3.3* 4.2 3.2*  CL 101  --  110 99* 101 102  CO2 32  --  28 25 24 23   GLUCOSE 133*  --  126* 139* 84 111*  BUN 40*  --  32* 23* 19 14  CREATININE 2.21*  --  1.87* 1.54* 1.47* 1.21*  CALCIUM 11.9*  --  10.9* 9.6 9.2 8.4*  MG  --  2.2 2.3  --   --   --    GFR: Estimated Creatinine Clearance: 22.7 mL/min (by C-G formula based on SCr of 1.21 mg/dL (H)). Liver Function Tests:  Recent Labs Lab 03/11/16 1203 03/11/16 1715 03/12/16 0548  AST 35 60* 46*  ALT 29 32 34  ALKPHOS 48 41 43  BILITOT 0.6 1.2 0.6  PROT 6.6 5.8* 6.3*  ALBUMIN 3.1* 2.9* 2.9*    Recent Labs Lab 03/11/16 1715  LIPASE <10*   No results for input(s): AMMONIA in the last 168 hours. Coagulation Profile: No results for input(s): INR, PROTIME in the last 168 hours. Cardiac Enzymes: No  results for input(s): CKTOTAL, CKMB, CKMBINDEX, TROPONINI in the last 168 hours. BNP (last 3 results) No results for input(s): PROBNP in the last 8760 hours. HbA1C: No results for input(s): HGBA1C in the last 72 hours. CBG:  Recent Labs Lab 03/12/16 0821 03/13/16 0845 03/14/16 0817 03/15/16 0811  GLUCAP 105* 106* 97 117*   Lipid Profile: No results for input(s): CHOL, HDL, LDLCALC, TRIG, CHOLHDL, LDLDIRECT in the  last 72 hours. Thyroid Function Tests: No results for input(s): TSH, T4TOTAL, FREET4, T3FREE, THYROIDAB in the last 72 hours. Anemia Panel: No results for input(s): VITAMINB12, FOLATE, FERRITIN, TIBC, IRON, RETICCTPCT in the last 72 hours. Sepsis Labs:  Recent Labs Lab 03/11/16 2024  LATICACIDVEN 1.11    Recent Results (from the past 240 hour(s))  Urine culture     Status: Abnormal   Collection Time: 03/11/16  6:55 PM  Result Value Ref Range Status   Specimen Description URINE, RANDOM  Final   Special Requests NONE  Final   Culture (A)  Final    20,000 COLONIES/mL LACTOBACILLUS SPECIES Standardized susceptibility testing for this organism is not available.    Report Status 03/13/2016 FINAL  Final         Radiology Studies: Dg Abd Portable 1v  Result Date: 03/14/2016 CLINICAL DATA:  Constipation for 4 days, has had several enemas EXAM: PORTABLE ABDOMEN - 1 VIEW COMPARISON:  22,017 FINDINGS: Normal retained stool burden. No bowel dilatation bowel wall thickening. Scattered atherosclerotic calcification with multiple stents in the LEFT iliac system. Bones demineralized with degenerative changes of the lumbar spine. IMPRESSION: Normal retained stool burden. Stable gas pattern. Electronically Signed   By: Ulyses SouthwardMark  Boles M.D.   On: 03/14/2016 13:00        Scheduled Meds: . aspirin  325 mg Oral Daily  . atorvastatin  5 mg Oral q1800  . cefTRIAXone (ROCEPHIN)  IV  1 g Intravenous Q24H  . diltiazem  45 mg Oral Q6H  . feeding supplement (ENSURE ENLIVE)  237  mL Oral BID BM  . iopamidol      . iopamidol      . multivitamin  1 tablet Oral BID  . nystatin  5 mL Oral QID  . oxybutynin  2.5 mg Oral BID  . pantoprazole sodium  40 mg Oral BID  . pentoxifylline  400 mg Oral q morning - 10a  . polyethylene glycol  17 g Oral BID  . potassium chloride  40 mEq Oral Once  . senna  1 tablet Oral BID  . sodium chloride flush  3 mL Intravenous Q12H   Continuous Infusions: . dextrose 75 mL/hr at 03/15/16 0330     LOS: 2 days    Time spent: 35 minutes.     Alba Coryegalado, Lorenza Winkleman A, MD [facility] Pager [phone]  If 7PM-7AM, please contact night-coverage [email or web address] Password [email or web address] 03/15/2016, 1:26 PM

## 2016-03-15 NOTE — Progress Notes (Deleted)
Tawana ScaleZach Timaya Bojarski D.O.  Sports Medicine [address] [place], [place] [address] Phone: [phone] Subjective:     CC: Bilateral shoulder pain Follow-up Has been a chronic issue with rotator cuff arthritic changes.  BJY:NWGNFAOZHYHPI:Subjective  Tracy GilbertOphelia W Kathlene [name] is a 82 y.o. female coming in with complaint of bilateral shoulder pain. . Does have known severe osteoarthritic changes of the shoulders bilaterally with rotator cuff arthropathy. Right shoulder was injected 10 weeks ago.  Patient states  Worsening symptoms in the left shoulder.  Worse with ADLs and sleeping as well.  Son has to treat her.    Past Medical History:  Diagnosis Date  . Atrial fibrillation (HCC)   . CAROTID STENOSIS   . CVA (cerebrovascular accident) (HCC) 2002  . DIABETES MELLITUS, TYPE II   . Diverticulosis of colon   . DIVERTICULOSIS, COLON   . DM (diabetes mellitus), type 2 (HCC)   . DVT (deep venous thrombosis) (HCC)   . GERD   . GERD (gastroesophageal reflux disease)   . HTN (hypertension)   . Hx of adenomatous colonic polyps 2007  . Hyperlipidemia   . HYPERLIPIDEMIA   . Hypertension   . Osteopenia   . OSTEOPENIA   . PVD (peripheral vascular disease) (HCC)    recently dx w/ carotid -left  int carotid >90% 50-60% rt int carotid 02/2010  . PVD (peripheral vascular disease) (HCC)    lf  ABI 0.11 and rt 0.43  . Sinus node dysfunction (HCC) 2011  . Stroke [facility](HCC) July 01, 2014   Affected Left side  . Upper GI bleed 02/2016   Past Surgical History:  Procedure Laterality Date  . CAROTID ENDARTERECTOMY    . cataract eye surgery    . CNS vascular stent per Neurosurgeon    . ESOPHAGOGASTRODUODENOSCOPY (EGD) WITH PROPOFOL N/A 02/24/2016   Procedure: ESOPHAGOGASTRODUODENOSCOPY (EGD) WITH PROPOFOL;  Surgeon: Kathi DerParag Brahmbhatt, MD;  Location: [facility] END[facility];  Service: Gastroenterology;  Laterality: N/A;  . EYE SURGERY    . ILIAC ARTERY STENT  2011   Bilateral CIA stent  . lazer eye surgery    . Right Shoulder  Surgery  2010  . TUBAL LIGATION      No Known Allergies Family History  Problem Relation Age of Onset  . Cancer Mother     colon  . Diabetes Sister     Amputation        Past medical history, social, surgical and family history all reviewed in electronic medical record.   Review of Systems: No headache, visual changes, nausea, vomiting, diarrhea, constipation, dizziness, abdominal pain, skin rash, fevers, chills, night sweats, weight loss, swollen lymph nodes.  Objective  There were no vitals taken for this visit.  General: No apparent distress alert and oriented x3 mood and affect normal, dressed appropriately. Frail HEENT: Pupils equal, extraocular movements intact  Respiratory: Patient's speak in full sentences and does not appear short of breath  Cardiovascular: No lower extremity edema, non tender, no erythema  Skin: Warm dry intact with no signs of infection or rash on extremities or on axial skeleton.  Abdomen: Soft nontender  Neuro: Cranial nerves II through XII are intact, neurovascularly intact in all extremities with 2+ DTRs and 2+ pulses.  Lymph: No lymphadenopathy of posterior or anterior cervical chain or axillae bilaterally.  Gait slow gait with some shoveling.  MSK:  Non tender with full range of motion and good stability and symmetric strength and tone of shoulders, elbows, wrist, hip, knee and ankles bilaterally. Patient  though is very frail and does have osteoarthritic changes of multiple joints. Shoulder: Bilateral Inspection reveals significant atrophy of the musculature bilaterally Decreased ROM 130 Forward flexion on left that is worse. , Patient has 5 degrees of external rotation and internal rotation to sacrum Rotator cuff strength 3/5.seems stable Positive signs of impingement bilaterally right greater than left Speeds and Yergason's tests normal. No labral pathology noted with negative Obrien's, negative clunk and good stability. Normal scapular  function observed. painful arc and positive drop arm sign.   Procedure: Real-time Ultrasound Guided Injection of left glenohumeral joint Device: GE Logiq E  Ultrasound guided injection is preferred based studies that show increased duration, increased effect, greater accuracy, decreased procedural pain, increased response rate with ultrasound guided versus blind injection.  Verbal informed consent obtained.  Time-out conducted.  Noted no overlying erythema, induration, or other signs of local infection.  Skin prepped in a sterile fashion.  Local anesthesia: Topical Ethyl chloride.  With sterile technique and under real time ultrasound guidance:  Joint visualized.  23g 1  inch needle inserted posterior approach. Pictures taken for needle placement. Patient did have injection of 2 cc of 1% lidocaine, 2 cc of 0.5% Marcaine, and 1.0 cc of Kenalog 40 mg/dL. Completed without difficulty  Pain immediately resolved suggesting accurate placement of the medication.  Advised to call if fevers/chills, erythema, induration, drainage, or persistent bleeding.  Images permanently stored and available for review in the ultrasound unit.  Impression: Technically successful ultrasound guided injection.     Impression and Recommendations:     This case required medical decision making of moderate complexity.

## 2016-03-15 NOTE — Care Management Note (Signed)
Case Management Note  Patient Details  Name: Horton FinerOphelia W Mukai MRN: [id number] Date of Birth: 1926/06/18  Subjective/Objective:         Presents with AKI, history of CVA, chronic atrial fibrillation not anticoagulated due to GI bleeding, hypertension, and peripheral vascular disease. From home with son, Lambert Ketolonzo.            Action/Plan: Pt deemed HRI 03/16/2016 @ LOS meeting.  Plan HH vs SNF Expected Discharge Date:                  Expected Discharge Plan:  Home w Home Health Services  In-House Referral:     Discharge planning Services  CM Consult  Post Acute Care Choice:    Choice offered to:  Patient/ Adult Children (Alonzo/son)  DME Arranged:    DME Agency:     HH Arranged:  PT HH Agency:  [facility]  Status of Service:  In process, will continue to follow  If discussed at Long Length of Stay Meetings, dates discussed:    Additional Comments:  Epifanio LeschesCole, Johniya Durfee Hudson, RN 03/15/2016, 3:22 PM

## 2016-03-15 NOTE — Progress Notes (Signed)
[facility] Progress Note  Tracy FinerOphelia W [name] 82 y.o. Oct 21, 1926   Subjective: Feels ok. Not oriented to place or time. No family in room.  Objective: Vital signs in last 24 hours: Vitals:   03/14/16 2344 03/15/16 0655  BP:  (!) 141/81  Pulse: (!) 108 76  Resp:  18  Temp:  98.1 F (36.7 C)    Physical Exam: Gen: alert, no acute distress, demented, elderly CV: RRR Chest: CTA B Abd: soft, nontender, nondistended, +BS Ext: no edema  Lab Results:  Recent Labs  03/14/16 0440 03/15/16 0648  NA 135 137  K 4.2 3.2*  CL 101 102  CO2 24 23  GLUCOSE 84 111*  BUN 19 14  CREATININE 1.47* 1.21*  CALCIUM 9.2 8.4*   No results for input(s): AST, ALT, ALKPHOS, BILITOT, PROT, ALBUMIN in the last 72 hours.  Recent Labs  03/14/16 0440 03/15/16 0648  WBC 14.0* 14.0*  HGB 10.5* 10.3*  HCT 33.5* 32.9*  MCV 90.5 89.6  PLT 453* 372   No results for input(s): LABPROT, INR in the last 72 hours.    Assessment/Plan: Peptic ulcer disease with poor PO intake - ulcers are in the proximal stomach so unlikely to cause a functional obstruction. Continue PPI PO BID. Supportive care. Will sign off. Call if questions.   Donzell Coller C. 03/15/2016, 11:34 AM   Pager [phone]  If no answer or after 5 PM call [phone] ID: Tracy Finerphelia W [name], female   DOB: Oct 21, 1926, 82 y.o.   MRN: [id number]

## 2016-03-15 NOTE — Progress Notes (Signed)
Physical Therapy Treatment Patient Details Name: Tracy FinerOphelia W [name] MRN: [id number] DOB: Jun 14, 1926 Today's Date: 03/15/2016    History of Present Illness is a 82 y.o. female with medical history significant for dementia, history of CVA, chronic atrial fibrillation not anticoagulated due to GI bleeding, hypertension, and peripheral vascular disease who presents to the emergency department at the direction of her PCP for evaluation of generalized weakness with food refusal. basic blood work revealed an acute kidney injury and critical hypokalemia.    PT Comments    Pt performed increased mobility and requiring decreased assist.  Safety remains diminished due to cognition.  Will continue to recommend HHPT with 24 hour assistance.    Follow Up Recommendations  Home health PT;Supervision/Assistance - 24 hour     Equipment Recommendations  None recommended by PT    Recommendations for Other Services       Precautions / Restrictions Precautions Precautions: Fall Restrictions Weight Bearing Restrictions: No    Mobility  Bed Mobility Overal bed mobility: Needs Assistance Bed Mobility: Supine to Sit     Supine to sit: Mod assist     General bed mobility comments: Cues for hand placement, pt distracted by lines and leads and required frequent redirection.    Transfers Overall transfer level: Needs assistance Equipment used: Rolling walker (2 wheeled) Transfers: Sit to/from Stand Sit to Stand: Min assist Stand pivot transfers: Min assist       General transfer comment: Cues for hand placement to and from seated surface.  Pt has difficulty following commands for carryover with safety.    Ambulation/Gait Ambulation/Gait assistance: Min assist Ambulation Distance (Feet): 100 Feet Assistive device: Rolling walker (2 wheeled) Gait Pattern/deviations: Step-through pattern;Decreased stride length;Shuffle;Drifts right/left Gait velocity: decr Gait velocity interpretation: Below  normal speed for age/gender General Gait Details: Pt drifts to R side of RW and required frequent redirection to maintain proximity due to RW.  Pt responds better to short direct commands.     Stairs            Wheelchair Mobility    Modified Rankin (Stroke Patients Only)       Balance Overall balance assessment: Needs assistance   Sitting balance-Leahy Scale: Fair       Standing balance-Leahy Scale: Poor                      Cognition Arousal/Alertness: Awake/alert Behavior During Therapy: Flat affect Overall Cognitive Status: History of cognitive impairments - at baseline                 General Comments: she appears oriented to self only, difficulty with naming family members and recognizing them.  Patients answering questions inappropriately.      Exercises      General Comments        Pertinent Vitals/Pain Pain Assessment: Faces Pain Score: 0-No pain    Home Living                      Prior Function            PT Goals (current goals can now be found in the care plan section) Acute Rehab PT Goals Patient Stated Goal: Unable to state.   Potential to Achieve Goals: Good Progress towards PT goals: Progressing toward goals    Frequency    Min 3X/week      PT Plan Current plan remains appropriate    Co-evaluation  End of Session Equipment Utilized During Treatment: Gait belt Activity Tolerance: Patient tolerated treatment well Patient left: in chair;with call bell/phone within reach;with family/visitor present     Time: [id number] PT Time Calculation (min) (ACUTE ONLY): 23 min  Charges:  $Gait Training: 8-22 mins $Therapeutic Activity: 8-22 mins                    G Codes:      Tracy [name] 03/15/2016, 4:44 PM  Tracy [name], PTA pager [phone]

## 2016-03-16 ENCOUNTER — Inpatient Hospital Stay (HOSPITAL_COMMUNITY): Payer: Medicare Other

## 2016-03-16 ENCOUNTER — Ambulatory Visit: Payer: Medicare Other | Admitting: Family Medicine

## 2016-03-16 LAB — BASIC METABOLIC PANEL
ANION GAP: 11 (ref 5–15)
BUN: 8 mg/dL (ref 6–20)
CHLORIDE: 102 mmol/L (ref 101–111)
CO2: 25 mmol/L (ref 22–32)
CREATININE: 1.02 mg/dL — AB (ref 0.44–1.00)
Calcium: 8.5 mg/dL — ABNORMAL LOW (ref 8.9–10.3)
GFR calc non Af Amer: 48 mL/min — ABNORMAL LOW (ref >60)
GFR, EST AFRICAN AMERICAN: 56 mL/min — AB (ref >60)
Glucose, Bld: 85 mg/dL (ref 65–99)
POTASSIUM: 3.8 mmol/L (ref 3.5–5.1)
Sodium: 138 mmol/L (ref 135–145)

## 2016-03-16 LAB — GLUCOSE, CAPILLARY: GLUCOSE-CAPILLARY: 80 mg/dL (ref 65–99)

## 2016-03-16 LAB — VITAMIN D 25 HYDROXY (VIT D DEFICIENCY, FRACTURES): VIT D 25 HYDROXY: 52.5 ng/mL (ref 30.0–100.0)

## 2016-03-16 LAB — CBC
HEMATOCRIT: 34.3 % — AB (ref 36.0–46.0)
HEMOGLOBIN: 10.9 g/dL — AB (ref 12.0–15.0)
MCH: 28.2 pg (ref 26.0–34.0)
MCHC: 31.8 g/dL (ref 30.0–36.0)
MCV: 88.9 fL (ref 78.0–100.0)
Platelets: 386 10*3/uL (ref 150–400)
RBC: 3.86 MIL/uL — AB (ref 3.87–5.11)
RDW: 15.7 % — ABNORMAL HIGH (ref 11.5–15.5)
WBC: 11 10*3/uL — ABNORMAL HIGH (ref 4.0–10.5)

## 2016-03-16 MED ORDER — SODIUM CHLORIDE 0.9% FLUSH
10.0000 mL | INTRAVENOUS | Status: DC | PRN
  Administered 2016-03-17 (×2): 10 mL
  Filled 2016-03-16 (×2): qty 40

## 2016-03-16 MED ORDER — POLYETHYLENE GLYCOL 3350 17 G PO PACK
17.0000 g | PACK | Freq: Every day | ORAL | Status: DC
  Filled 2016-03-16 (×2): qty 1

## 2016-03-16 MED ORDER — LORAZEPAM 2 MG/ML IJ SOLN
0.5000 mg | Freq: Once | INTRAMUSCULAR | Status: AC
  Administered 2016-03-17: 0.5 mg via INTRAVENOUS
  Filled 2016-03-16: qty 1

## 2016-03-16 NOTE — Progress Notes (Signed)
Nutrition Follow-up  DOCUMENTATION CODES:   Not applicable  INTERVENTION:   -Continue Ensure Enlive po TID, each supplement provides 350 kcal and 20 grams of protein -Continue MVI -Continue Magic Cup TID with meals -If pt remains unable to meet nutritional needs via PO route, consider initiation of enteral feeding via cortrak tube:  Initiate Osmolite 1.2 @ 20 ml/hr increase by 10 ml every 12 hours to goal rate of 40 ml/hr.   Tube feeding regimen provides 1152 kcal (100% of needs), 53 grams of protein, and 787 ml of H2O.   NUTRITION DIAGNOSIS:   Inadequate oral intake related to lethargy/confusion, poor appetite as evidenced by per patient/family report, meal completion < 25%.  Ongoing  GOAL:   Patient will meet greater than or equal to 90% of their needs  Unmet  MONITOR:   PO intake, Supplement acceptance, Labs, Weight trends, Skin, I & O's  REASON FOR ASSESSMENT:   Consult Assessment of nutrition requirement/status  ASSESSMENT:   Tracy [name] is a 82 y.o. female with medical history significant for dementia, history of CVA, chronic atrial fibrillation not anticoagulated due to GI bleeding, hypertension, and peripheral vascular disease who presents to the emergency department at the direction of her PCP for evaluation of generalized weakness with food refusal and lab abnormalities  Pt receiving nursing care at times of both visits.   Pt continues to eat very poorly; noted 0% of meal completion. Pt is also refusing Ensure supplements.   GI consulted on 03/14/16; recommend hold off on endoscopic procedures.   If pt remains unable to meet nutritional needs via PO, pt may benefit from enteral nutrition support.   Labs reviewed.   Diet Order:  Diet regular Room service appropriate? Yes; Fluid consistency: Thin  Skin:  Reviewed, no issues  Last BM:  03/15/16  Height:   Ht Readings from Last 1 Encounters:  02/23/16 4\' 11"  (1.499 m)    Weight:   Wt  Readings from Last 1 Encounters:  03/15/16 94 lb 12.8 oz (43 kg)    Ideal Body Weight:  44.5 kg  BMI:  Body mass index is 19.15 kg/m.  Estimated Nutritional Needs:   Kcal:  1100-1300  Protein:  45-60 grams  Fluid:  > 1 L  EDUCATION NEEDS:   Education needs addressed  Alexys Lobello A. Mayford KnifeWilliams, RD, LDN, CDE Pager: [phone] After hours Pager: [phone]

## 2016-03-16 NOTE — Progress Notes (Signed)
Foley catheter placed for urinary retention. Bladder scan showed 800cc.

## 2016-03-16 NOTE — Progress Notes (Signed)
Physical Therapy Treatment Patient Details Name: Tracy FinerOphelia W Hammond MRN: [id number] DOB: [birth year] Today's Date: 03/16/2016    History of Present Illness is a 82 y.o. female with medical history significant for dementia, history of CVA, chronic atrial fibrillation not anticoagulated due to GI bleeding, hypertension, and peripheral vascular disease who presents to the emergency department at the direction of her PCP for evaluation of generalized weakness with food refusal. basic blood work revealed an acute kidney injury and critical hypokalemia.    PT Comments    Pt performed decreased mobility presents with increased agitation.  MD present during session and report patient to go for MRI of head.  Will f/u per POC to progress mobility.    Follow Up Recommendations  Home health PT;Supervision/Assistance - 24 hour     Equipment Recommendations  None recommended by PT    Recommendations for Other Services       Precautions / Restrictions Precautions Precautions: Fall Restrictions Weight Bearing Restrictions: No    Mobility  Bed Mobility Overal bed mobility: Needs Assistance Bed Mobility: Supine to Sit     Supine to sit: Mod assist;+2 for physical assistance     General bed mobility comments: Assist with lowering trunk and lifting B LEs into bed.    Transfers   Equipment used: Rolling walker (2 wheeled) Transfers: Sit to/from Stand Sit to Stand: Mod assist;Min assist (assist level varried based on cognition.  ) Stand pivot transfers: Mod assist (did not use RW for stand pivot transfer.  )       General transfer comment: Cues for hand placement to and from seated surface.  Pt has difficulty following commands for carryover with safety.  Pt on BSC with BM and required repeated standing trial to allow nursing to perform perianal care.  Pt able to tolerate standing for 10-20 seconds then reports she needs to sit again.  After failed attempts to clean thoroughly patient  transferred to bed to complete perianal care.    Ambulation/Gait                 Stairs            Wheelchair Mobility    Modified Rankin (Stroke Patients Only)       Balance Overall balance assessment: Needs assistance   Sitting balance-Leahy Scale: Fair       Standing balance-Leahy Scale: Poor                      Cognition Arousal/Alertness: Awake/alert Behavior During Therapy: Flat affect Overall Cognitive Status: Impaired/Different from baseline Area of Impairment: Attention;Memory;Following commands;Safety/judgement;Awareness;Problem solving       Following Commands: Follows one step commands inconsistently;Follows one step commands with increased time Safety/Judgement: Decreased awareness of safety;Decreased awareness of deficits   Problem Solving: Slow processing;Decreased initiation;Difficulty sequencing;Requires verbal cues;Requires tactile cues General Comments: Pt more agitated during session and has difficulty following commands.  Pt reports asking daughter how long it is going to take to get married in this church.      Exercises      General Comments        Pertinent Vitals/Pain Pain Assessment: No/denies pain    Home Living                      Prior Function            PT Goals (current goals can now be found in the care plan section) Acute Rehab PT Goals  Patient Stated Goal: Unable to state.   Potential to Achieve Goals: Good    Frequency    Min 3X/week      PT Plan Current plan remains appropriate    Co-evaluation             End of Session Equipment Utilized During Treatment: Gait belt Activity Tolerance: Patient tolerated treatment well Patient left: in chair;with call bell/phone within reach;with family/visitor present     Time: 1121-1139 PT Time Calculation (min) (ACUTE ONLY): 18 min  Charges:  $Therapeutic Activity: 8-22 mins                    G Codes:      Florestine Aversimee J  Charisse Wendell 03/16/2016, 11:46 AM  Joycelyn RuaAimee Demitrius Crass, PTA pager [phone]

## 2016-03-16 NOTE — Progress Notes (Addendum)
Patient experiencing frequency, urgency, and voiding small amounts, bladder scanned for >575. Dr. Donnamarie PoagK Kirby paged. Awaiting return phone call.  Order placed for in and out catheterization PRN. Cath preformed. 800cc out.

## 2016-03-16 NOTE — Progress Notes (Signed)
Peripherally Inserted Central Catheter/Midline Placement  The IV Nurse has discussed with the patient and/or persons authorized to consent for the patient, the purpose of this procedure and the potential benefits and risks involved with this procedure.  The benefits include less needle sticks, lab draws from the catheter, and the patient may be discharged home with the catheter. Risks include, but not limited to, infection, bleeding, blood clot (thrombus formation), and puncture of an artery; nerve damage and irregular heartbeat and possibility to perform a PICC exchange if needed/ordered by physician.  Alternatives to this procedure were also discussed.  Bard Power PICC patient education guide, fact sheet on infection prevention and patient information card has been provided to patient /or left at bedside.    PICC/Midline Placement Documentation  PICC Single Lumen 03/16/16 PICC Right Brachial 35 cm 0 cm (Active)  Indication for Insertion or Continuance of Line Home intravenous therapies (PICC only) 03/16/2016  5:00 PM  Exposed Catheter (cm) 1 cm 03/16/2016  5:00 PM  Dressing Change Due 03/23/16 03/16/2016  5:00 PM       Stacie GlazeJoyce, Purity Irmen Horton 03/16/2016, 5:28 PM

## 2016-03-16 NOTE — Progress Notes (Signed)
PROGRESS NOTE    Horton FinerOphelia W Molzahn  [name]:[id number] DOB: 06-27-1926 DOA: 03/11/2016 PCP: Oliver BarreJames John, MD   Brief Narrative: : Horton FinerOphelia W Stimson is a 82 y.o. female with medical history significant for dementia, history of CVA, chronic atrial fibrillation not anticoagulated due to GI bleeding, hypertension, and peripheral vascular disease who presents to the emergency department at the direction of her PCP for evaluation of generalized weakness with food refusal and lab abnormalities. Patient is accompanied by her son and daughter who assist with the history. Patient reportedly lives with her son who is at the bedside and had been in her usual state until approximately 4 days ago when she seemed to be generally weak and was refusing food and drink. She has continued to exhibit increasing generalized weakness and continues to refuse any food or drink, and has not been taking her medications.   Patient admitted with Failure to thrive. She was found to have constipation. She was treated with laxatives. She has had multiples BM, today she is having diarrhea. Patient had CT abdomen which was negative for acute pathology.  Overnight 12-11 patient has develops urinary retention and confusion. Foley catheter has been place. Will order MRI brain to evaluate acute encephalopathy.    Assessment & Plan:   Principal Problem:   Acute kidney injury (HCC) Active Problems:   Essential hypertension   GERD   Constipation   General weakness   Atrial fibrillation with RVR (HCC)   Hypercalcemia   Chronic atrial fibrillation (HCC)   Gastric ulcer   Hypokalemia   Loss of appetite   Leukocytosis   AKI (acute kidney injury) (HCC)   Dehydration  1. Acute kidney injury  - SCr is 2.21 on admission, up from 0.77 two weeks prior  - improving with IV fluids, cr has decreased to 1.2.  -continue with IV fluids. Improved.  -Patient has develops urinary retention. She has foley catheter place 12-11  2.  Generalized  weakness, loss of appetite, early satiety, FTT;  - Patient was evaluated by PCP with normal TSH on 03/11/16 continue with IV fluids.  -urine culture no growth.  -treating  constipation.  -CT abdomen pelvis with no acute abnormalities. GI evaluated patient , and they don't have further recommendations.  -B 12 1029 and Vitamin D 52.     3-Acute encephalopathy;  patient very confuse this morning, didn't recognized daughter, agitated at times.  Will get MRI brain.   4. Constipation  - Dehydration and calcium supplements likely aggravating  -treated with with miralax BID, lactulose, dulcolax suppository .  -initial KUB with prominent amount of stool.  -having multiples BM today. Hold laxatives.   5. Chronic atrial fibrillation  - CHADS-VASc is at least 55 (age x2, gender, hx CVA x2) - She is not anticoagulated d/t GI bleeds, recent admission 2 weeks ago.  - continue with Cardizem., PRN lopressor ordered    6. GERD, gastric ulcer  - EGD from November 2017 with esophageal ulcer and gastric ulcer - Continue with protonix.   7. Leukocytosis  - WBC is 13,000 on admission without fever or apparent source of infection  - CXR clear and UA not suggestive of infection; no upper resp sxs and no vomiting or diarrhea  -WBC fluctuates.  -will stop ceftriaxone.   8. Hypercalcemia; resolved.  - Serum calcium is 11.9 on admission in setting of marked dehydration and calcium supplements  - She was treated with fluid and calcium supplements will be held  - improving with fluids.  9-Leukocytosis, CT abdomen no evidence of infection. Stop ceftriaxone.   Hypokalemia  - Serum potassium is 2.8 on admission  - corrected.     DVT prophylaxis: SCD due to recent GI bleed.  Code Status: full code.  Family Communication: son and daughter  at bed side.  Disposition Plan: home when stable.  Consultants:   none   Procedures: none   Antimicrobials: none    Subjective: Still refuse  to  eat.  Patient develops urinary retention this am.  She is confuse and agitated.    Objective: Vitals:   03/15/16 1337 03/15/16 2302 03/15/16 2303 03/16/16 0609  BP: (!) 141/87 (!) 169/138 (!) 169/138 131/68  Pulse: 94 (!) 105 (!) 105 (!) 109  Resp: 20 16 18 16   Temp:  97.9 F (36.6 C) 97.9 F (36.6 C) 97.6 F (36.4 C)  TempSrc:   Oral Oral  SpO2: 100% (!) 78% 98% 98%  Weight:        Intake/Output Summary (Last 24 hours) at 03/16/16 1526 Last data filed at 03/16/16 1030  Gross per 24 hour  Intake                0 ml  Output              800 ml  Net             -800 ml   Filed Weights   03/13/16 0310 03/14/16 0504 03/15/16 0500  Weight: 43 kg (94 lb 12.9 oz) 43.1 kg (95 lb) 43 kg (94 lb 12.8 oz)    Examination:  General exam: Appears calm and comfortable  Respiratory system: Clear to auscultation. Respiratory effort normal. Cardiovascular system: S1 & S2 heard, RRR. No JVD, murmurs, rubs, gallops or clicks. No pedal edema. Gastrointestinal system: Abdomen is nondistended, soft and nontender. No organomegaly or masses felt. Normal bowel sounds heard. Central nervous system: Alert and oriented. No focal neurological deficits. Extremities: Symmetric 5 x 5 power. Skin: No rashes, lesions or ulcers Psychiatry: Judgement and insight appear normal. Mood & affect appropriate.     Data Reviewed: I have personally reviewed following labs and imaging studies  CBC:  Recent Labs Lab 03/12/16 1104 03/13/16 0523 03/14/16 0440 03/15/16 0648 03/16/16 0604  WBC 11.0* 11.8* 14.0* 14.0* 11.0*  HGB 10.2* 10.8* 10.5* 10.3* 10.9*  HCT 33.8* 33.8* 33.5* 32.9* 34.3*  MCV 93.4 91.8 90.5 89.6 88.9  PLT 482* 415* 453* 372 386   Basic Metabolic Panel:  Recent Labs Lab 03/11/16 2340 03/12/16 0548 03/13/16 0523 03/14/16 0440 03/15/16 0648 03/16/16 0604  NA  --  150* 135 135 137 138  K  --  3.3* 3.3* 4.2 3.2* 3.8  CL  --  110 99* 101 102 102  CO2  --  28 25 24 23 25   GLUCOSE   --  126* 139* 84 111* 85  BUN  --  32* 23* 19 14 8   CREATININE  --  1.87* 1.54* 1.47* 1.21* 1.02*  CALCIUM  --  10.9* 9.6 9.2 8.4* 8.5*  MG 2.2 2.3  --   --   --   --    GFR: Estimated Creatinine Clearance: 26.9 mL/min (by C-G formula based on SCr of 1.02 mg/dL (H)). Liver Function Tests:  Recent Labs Lab 03/11/16 1203 03/11/16 1715 03/12/16 0548  AST 35 60* 46*  ALT 29 32 34  ALKPHOS 48 41 43  BILITOT 0.6 1.2 0.6  PROT 6.6 5.8* 6.3*  ALBUMIN 3.1* 2.9* 2.9*  Recent Labs Lab 03/11/16 1715  LIPASE <10*   No results for input(s): AMMONIA in the last 168 hours. Coagulation Profile: No results for input(s): INR, PROTIME in the last 168 hours. Cardiac Enzymes: No results for input(s): CKTOTAL, CKMB, CKMBINDEX, TROPONINI in the last 168 hours. BNP (last 3 results) No results for input(s): PROBNP in the last 8760 hours. HbA1C: No results for input(s): HGBA1C in the last 72 hours. CBG:  Recent Labs Lab 03/12/16 0821 03/13/16 0845 03/14/16 0817 03/15/16 0811 03/16/16 0754  GLUCAP 105* 106* 97 117* 80   Lipid Profile: No results for input(s): CHOL, HDL, LDLCALC, TRIG, CHOLHDL, LDLDIRECT in the last 72 hours. Thyroid Function Tests: No results for input(s): TSH, T4TOTAL, FREET4, T3FREE, THYROIDAB in the last 72 hours. Anemia Panel:  Recent Labs  03/15/16 1227  VITAMINB12 1,029*   Sepsis Labs:  Recent Labs Lab 03/11/16 2024  LATICACIDVEN 1.11    Recent Results (from the past 240 hour(s))  Urine culture     Status: Abnormal   Collection Time: 03/11/16  6:55 PM  Result Value Ref Range Status   Specimen Description URINE, RANDOM  Final   Special Requests NONE  Final   Culture (A)  Final    20,000 COLONIES/mL LACTOBACILLUS SPECIES Standardized susceptibility testing for this organism is not available.    Report Status 03/13/2016 FINAL  Final         Radiology Studies: Ct Abdomen Pelvis Wo Contrast  Result Date: 03/15/2016 CLINICAL DATA:   Abdominal pain.  History of diverticulosis. EXAM: CT ABDOMEN AND PELVIS WITHOUT CONTRAST TECHNIQUE: Multidetector CT imaging of the abdomen and pelvis was performed following the standard protocol without IV contrast. COMPARISON:  None. FINDINGS: Lower chest: Partially calcified pleural plaques are again demonstrated. Hepatobiliary: No focal hepatic lesions noncontrast exam. Under gallstones noted within a nondistended gallbladder. Pancreas: Pancreatic head is poorly evaluated on this noncontrast exam. Body and tail appear normal. No duct dilatation. Spleen: Normal spleen Adrenals/urinary tract: Adrenal glands and kidneys are normal. The ureters and bladder normal. Stomach/Bowel: Small hiatal hernia. Duodenum small bowel are normal. RIGHT colon is normal. Several scattered diverticular of the LEFT colon. Extensive diverticulosis of the sigmoid colon. No acute inflammation Vascular/Lymphatic: Iliac stents in the iliac arteries. Extensive vascular calcification Reproductive: Uterus and ovaries normal Other: No free fluid. Musculoskeletal: No aggressive osseous lesion. IMPRESSION: 1. No acute findings the abdomen pelvis. 2. Extensive sigmoid diverticulosis without evidence of diverticulitis. 3.  Atherosclerotic calcification of the aorta.  Iliac stents noted 4. Cholelithiasis without cholecystitis. 5. Pancreas head poorly evaluated on noncontrast exam. 6. Partially calcified pleural plaques. Electronically Signed   By: Genevive Bi M.D.   On: 03/15/2016 17:10        Scheduled Meds: . aspirin  325 mg Oral Daily  . atorvastatin  5 mg Oral q1800  . diltiazem  45 mg Oral Q6H  . feeding supplement (ENSURE ENLIVE)  237 mL Oral BID BM  . multivitamin  1 tablet Oral BID  . nystatin  5 mL Oral QID  . oxybutynin  2.5 mg Oral BID  . pantoprazole sodium  40 mg Oral BID  . pentoxifylline  400 mg Oral q morning - 10a  . [START ON 03/17/2016] polyethylene glycol  17 g Oral Daily  . sodium chloride flush  3 mL  Intravenous Q12H   Continuous Infusions: . dextrose 75 mL/hr at 03/15/16 1703     LOS: 3 days    Time spent: 35 minutes.  Alba Coryegalado, Sam Wunschel A, MD [facility] Pager [phone]  If 7PM-7AM, please contact night-coverage [email or web address] Password [email or web address] 03/16/2016, 3:26 PM

## 2016-03-17 ENCOUNTER — Inpatient Hospital Stay (HOSPITAL_COMMUNITY): Payer: Medicare Other

## 2016-03-17 LAB — URINALYSIS, ROUTINE W REFLEX MICROSCOPIC
Bilirubin Urine: NEGATIVE
GLUCOSE, UA: NEGATIVE mg/dL
Hgb urine dipstick: NEGATIVE
KETONES UR: NEGATIVE mg/dL
Nitrite: NEGATIVE
PROTEIN: NEGATIVE mg/dL
Specific Gravity, Urine: 1.006 (ref 1.005–1.030)
pH: 8 (ref 5.0–8.0)

## 2016-03-17 LAB — AMMONIA: Ammonia: 34 umol/L (ref 9–35)

## 2016-03-17 LAB — BASIC METABOLIC PANEL
ANION GAP: 7 (ref 5–15)
BUN: 7 mg/dL (ref 6–20)
CALCIUM: 7.9 mg/dL — AB (ref 8.9–10.3)
CO2: 27 mmol/L (ref 22–32)
Chloride: 104 mmol/L (ref 101–111)
Creatinine, Ser: 1 mg/dL (ref 0.44–1.00)
GFR, EST AFRICAN AMERICAN: 57 mL/min — AB (ref >60)
GFR, EST NON AFRICAN AMERICAN: 50 mL/min — AB (ref >60)
GLUCOSE: 112 mg/dL — AB (ref 65–99)
POTASSIUM: 3 mmol/L — AB (ref 3.5–5.1)
Sodium: 138 mmol/L (ref 135–145)

## 2016-03-17 LAB — CBC
HCT: 30.6 % — ABNORMAL LOW (ref 36.0–46.0)
Hemoglobin: 9.7 g/dL — ABNORMAL LOW (ref 12.0–15.0)
MCH: 28.2 pg (ref 26.0–34.0)
MCHC: 31.7 g/dL (ref 30.0–36.0)
MCV: 89 fL (ref 78.0–100.0)
PLATELETS: 367 10*3/uL (ref 150–400)
RBC: 3.44 MIL/uL — AB (ref 3.87–5.11)
RDW: 16 % — AB (ref 11.5–15.5)
WBC: 10.2 10*3/uL (ref 4.0–10.5)

## 2016-03-17 LAB — GLUCOSE, CAPILLARY: Glucose-Capillary: 107 mg/dL — ABNORMAL HIGH (ref 65–99)

## 2016-03-17 MED ORDER — SODIUM CHLORIDE 0.9 % IV SOLN
30.0000 meq | INTRAVENOUS | Status: AC
  Administered 2016-03-17 (×2): 30 meq via INTRAVENOUS
  Filled 2016-03-17 (×2): qty 15

## 2016-03-17 MED ORDER — HALOPERIDOL LACTATE 5 MG/ML IJ SOLN
1.0000 mg | Freq: Once | INTRAMUSCULAR | Status: AC
  Administered 2016-03-17: 1 mg via INTRAVENOUS
  Filled 2016-03-17: qty 1

## 2016-03-17 MED ORDER — LORAZEPAM 2 MG/ML IJ SOLN
0.5000 mg | Freq: Once | INTRAMUSCULAR | Status: AC | PRN
  Administered 2016-03-17: 0.5 mg via INTRAVENOUS
  Filled 2016-03-17: qty 1

## 2016-03-17 NOTE — Progress Notes (Signed)
No order placed to keep foley in but this RN saw that MD Regalado was aware that one was being placed per note. Will put in order.

## 2016-03-17 NOTE — Care Management Important Message (Signed)
Important Message  Patient Details  Name: Tracy [name] MRN: [id number] Date of Birth: Jul 13, 1926   Medicare Important Message Given:  Yes    Kyla BalzarineShealy, Beaulah Romanek Abena 03/17/2016, 10:24 AM

## 2016-03-17 NOTE — Progress Notes (Signed)
RN gave pt PRN ativan for her MRI. Pt fell asleep and transport was called to get pt. RN went down with transport- pt started to move around and toss and turn. Tech waited for pt to calm down but she only moved around more. After waiting about 15 mins Tech and RN decided that pt would not stay still for MRI. Pt was brought back up to unit. NP will be notified. Day Rn will be made aware.

## 2016-03-17 NOTE — NC FL2 (Signed)
[place] MEDICAID FL2 LEVEL OF CARE SCREENING TOOL     IDENTIFICATION  Patient Name: Tracy [name] Birthdate: 08/27/1926 Sex: female Admission Date (Current Location): 03/11/2016  [facility] and [place] Number:  Producer, television/film/video and Address:  [facility], [address], [place], [place] [address]      Provider Number: [id number]  Attending Physician Name and Address:  Leatha Gilding, MD  Relative Name and Phone Number:       Current Level of Care: Hospital Recommended Level of Care: Skilled Nursing Facility Prior Approval Number:    Date Approved/Denied:   PASRR Number:   [id number]   Discharge Plan: SNF    Current Diagnoses: Patient Active Problem List   Diagnosis Date Noted  . Dehydration   . Urine incontinence 03/11/2016  . Hypokalemia 03/11/2016  . Loss of appetite 03/11/2016  . Leukocytosis 03/11/2016  . AKI (acute kidney injury) (HCC) 03/11/2016  . Gastric ulcer 03/04/2016  . Acute blood loss anemia   . Chronic diastolic CHF (congestive heart failure) (HCC)   . Dilated cardiomyopathy (HCC)   . Chronic atrial fibrillation (HCC)   . Pulmonary hypertension   . Right lumbar radiculopathy 02/12/2016  . Superficial bruising of foot 02/12/2016  . Irregular heart beats 02/04/2016  . General weakness 02/04/2016  . Atrial fibrillation with RVR (HCC) 02/04/2016  . Anemia, iron deficiency 02/04/2016  . Hypercalcemia 02/04/2016  . Acute kidney injury (HCC) 02/04/2016  . Right sided sciatica 11/18/2015  . Trigger point of right shoulder region 05/13/2015  . Cerebrovascular accident (CVA) due to thrombosis of right posterior cerebral artery (HCC) 04/17/2015  . PVD (peripheral vascular disease) (HCC) 04/17/2015  . Overactive bladder 11/15/2014  . Weakness 10/24/2014  . Cerebral infarction due to thrombosis of right middle cerebral artery (HCC) 10/17/2014  . Monoplegia of left lower extremity (HCC) 08/16/2014  . Gait disturbance,  post-stroke 08/16/2014  . Altered sensation due to stroke (HCC) 07/15/2014  . Hip pain, acute   . Bradycardia 07/01/2014  . Elevated troponin 07/01/2014  . Dysuria 07/01/2014  . Constipation 07/01/2014  . Right ankle pain 01/29/2014  . Rotator cuff tear arthropathy of right shoulder 06/19/2013  . Left rotator cuff tear arthropathy 05/17/2013  . Aneurysm of iliac artery (HCC) 01/26/2012  . Peripheral vascular disease (HCC) 01/26/2012  . Left shoulder pain 02/09/2011  . Preventative health care 08/02/2010  . Sinus bradycardia 05/29/2010  . SICK SINUS SYNDROME 03/10/2010  . Occlusion and stenosis of carotid artery 03/10/2010  . PVD WITH CLAUDICATION 02/23/2010  . NECK MASS 10/01/2008  . DIVERTICULOSIS, COLON 08/01/2007  . SHOULDER PAIN, RIGHT 08/01/2007  . History of colonic polyps 08/01/2007  . GERD 12/25/2006  . Disorder of bone and cartilage 12/25/2006  . Hyperlipidemia 11/29/2006  . Essential hypertension 11/29/2006    Orientation RESPIRATION BLADDER Height & Weight     Self  Normal Incontinent, Indwelling catheter Weight: 95 lb 11.2 oz (43.4 kg) Height:     BEHAVIORAL SYMPTOMS/MOOD NEUROLOGICAL BOWEL NUTRITION STATUS      Continent Diet (carb modified)  AMBULATORY STATUS COMMUNICATION OF NEEDS Skin   Extensive Assist Verbally Normal                       Personal Care Assistance Level of Assistance  Bathing, Dressing Bathing Assistance: Maximum assistance   Dressing Assistance: Maximum assistance     Functional Limitations Info  SPECIAL CARE FACTORS FREQUENCY  PT (By licensed PT), OT (By licensed OT)     PT Frequency: 5/wk OT Frequency: 5/wk            Contractures      Additional Factors Info  Code Status, Allergies Code Status Info: FULL Allergies Info: NKA           Current Medications (03/17/2016):  This is the current hospital active medication list Current Facility-Administered Medications  Medication Dose Route  Frequency Provider Last Rate Last Dose  . acetaminophen (TYLENOL) tablet 650 mg  650 mg Oral Q6H PRN Briscoe Deutscherimothy S Opyd, MD       Or  . acetaminophen (TYLENOL) suppository 650 mg  650 mg Rectal Q6H PRN Briscoe Deutscherimothy S Opyd, MD      . aspirin tablet 325 mg  325 mg Oral Daily Belkys A Regalado, MD   325 mg at 03/16/16 1112  . atorvastatin (LIPITOR) tablet 5 mg  5 mg Oral q1800 Briscoe Deutscherimothy S Opyd, MD   5 mg at 03/16/16 1833  . bisacodyl (DULCOLAX) suppository 10 mg  10 mg Rectal Daily PRN Belkys A Regalado, MD   10 mg at 03/13/16 1232  . dextrose 5 % solution   Intravenous Continuous Belkys A Regalado, MD 75 mL/hr at 03/17/16 0538    . diltiazem (CARDIZEM) 10 mg/ml oral suspension 45 mg  45 mg Oral Q6H Belkys A Regalado, MD   45 mg at 03/17/16 0526  . feeding supplement (ENSURE ENLIVE) (ENSURE ENLIVE) liquid 237 mL  237 mL Oral BID BM Lavone Neriimothy S Opyd, MD   237 mL at 03/16/16 1400  . HYDROcodone-acetaminophen (NORCO/VICODIN) 5-325 MG per tablet 1-2 tablet  1-2 tablet Oral Q4H PRN Briscoe Deutscherimothy S Opyd, MD   1 tablet at 03/16/16 0048  . lip balm (BLISTEX) ointment   Topical PRN Belkys A Regalado, MD      . LORazepam (ATIVAN) injection 0.5 mg  0.5 mg Intravenous Once PRN Costin Otelia SergeantM Gherghe, MD      . metoprolol (LOPRESSOR) injection 5 mg  5 mg Intravenous Q6H PRN Belkys A Regalado, MD   5 mg at 03/15/16 2125  . multivitamin (PROSIGHT) tablet 1 tablet  1 tablet Oral BID Briscoe Deutscherimothy S Opyd, MD   1 tablet at 03/16/16 2045  . nystatin (MYCOSTATIN) 100000 UNIT/ML suspension 500,000 Units  5 mL Oral QID Alba CoryBelkys A Regalado, MD   500,000 Units at 03/16/16 1833  . ondansetron (ZOFRAN) tablet 4 mg  4 mg Oral Q6H PRN Briscoe Deutscherimothy S Opyd, MD       Or  . ondansetron (ZOFRAN) injection 4 mg  4 mg Intravenous Q6H PRN Briscoe Deutscherimothy S Opyd, MD      . oxybutynin (DITROPAN) 5 MG/5ML syrup 2.5 mg  2.5 mg Oral BID Belkys A Regalado, MD   2.5 mg at 03/16/16 1107  . pantoprazole sodium (PROTONIX) 40 mg/20 mL oral suspension 40 mg  40 mg Oral BID Kathi DerParag Brahmbhatt,  MD   40 mg at 03/16/16 2045  . pentoxifylline (TRENTAL) CR tablet 400 mg  400 mg Oral q morning - 10a Briscoe Deutscherimothy S Opyd, MD   400 mg at 03/15/16 1125  . phenol (CHLORASEPTIC) mouth spray 1 spray  1 spray Mouth/Throat PRN Belkys A Regalado, MD      . polyethylene glycol (MIRALAX / GLYCOLAX) packet 17 g  17 g Oral Daily Belkys A Regalado, MD      . sodium chloride flush (NS) 0.9 % injection 10-40 mL  10-40 mL  Intracatheter PRN Alba CoryBelkys A Regalado, MD   10 mL at 03/17/16 0231  . sodium chloride flush (NS) 0.9 % injection 3 mL  3 mL Intravenous Q12H Briscoe Deutscherimothy S Opyd, MD   3 mL at 03/13/16 0906     Discharge Medications: Please see discharge summary for a list of discharge medications.  Relevant Imaging Results:  Relevant Lab Results:   Additional Information SS#: [id number]  Burna SisUris, Darcell Sabino H, LCSW

## 2016-03-17 NOTE — Consult Note (Signed)
   [facility] CM [facility] Consult   03/17/2016  Horton FinerOphelia W Otero Jan 14, 1927 [id number]    Patient screened for potential [facility] Care Management services. Chart reviewed. Noted current discharge plan is for SNF.  There are no identifiable [facility] needs at this time. If patient's post hospital needs change, please place a [facility] Care Management consult. For questions please contact:  Raiford Nobletika Hall, MSN-Ed, RN,BSN [facility] Care Management Hospital Liaison [phone]

## 2016-03-17 NOTE — Clinical Social Work Note (Signed)
Clinical Social Work Assessment  Patient Details  Name: Tracy [name] MRN: [id number] Date of Birth: 09-19-1926  Date of referral:  03/17/16               Reason for consult:  Facility Placement                Permission sought to share information with:  Facility Medical sales representativeContact Representative, Family Supports Permission granted to share information::  Yes, Verbal Permission Granted  Name::     Nicholes Roughlonzo, Cathy  Agency::  SNF  Relationship::  son, dtr  Contact Information:     Housing/Transportation Living arrangements for the past 2 months:  Single Family Home Source of Information:  Adult Children Patient Interpreter Needed:  None Criminal Activity/Legal Involvement Pertinent to Current Situation/Hospitalization:  No - Comment as needed Significant Relationships:  Adult Children Lives with:  Adult Children Do you feel safe going back to the place where you live?    Need for family participation in patient care:  Yes (Comment) (caregiving at home)  Care giving concerns:  Pt lives at home with son who is primary caregiver- patient much weaker than normal and patient son concerned he is unable to care for her in current state.   Social Worker assessment / plan:  Pt son requested to speak with CSW regarding SNF.  States he wants to take the patient home if at all possible but understands it would not be safe with her current weakness.  CSW explained SNF and SNF referral process.  Employment status:  Retired Database administratornsurance information:  Managed Medicare PT Recommendations:  Home with Home Health, 24 Hour Supervision Information / Referral to community resources:  Skilled Nursing Facility  Patient/Family's Response to care:  Son is agreeable to SNF placement if patient does not improve during hospital stay.  Patient/Family's Understanding of and Emotional Response to Diagnosis, Current Treatment, and Prognosis:  Son has no questions or concerns- hopeful patient will improve with a very short  rehab stay.  Emotional Assessment Appearance:  Appears stated age Attitude/Demeanor/Rapport:  Unable to Assess Affect (typically observed):  Unable to Assess Orientation:  Oriented to Self Alcohol / Substance use:  Not Applicable Psych involvement (Current and /or in the community):  No (Comment)  Discharge Needs  Concerns to be addressed:  Care Coordination Readmission within the last 30 days:  No Current discharge risk:  Physical Impairment Barriers to Discharge:  Continued Medical Work up   Burna SisUris, Jonnae Fonseca H, LCSW 03/17/2016, 4:49 PM

## 2016-03-17 NOTE — Progress Notes (Signed)
PROGRESS NOTE  Tracy [name] [id number] DOB: 1926-12-23 DOA: 03/11/2016 PCP: Oliver BarreJames John, MD   LOS: 4 days   Brief Narrative: Tracy [name] a 82 y.o.femalewith medical history significant for dementia, history of CVA, chronic atrial fibrillation not anticoagulated due to GI bleeding, hypertension, and peripheral vascular disease who presents to the emergency department at the direction of her PCP for evaluation of generalized weakness with food refusal and lab abnormalities. Patient is accompanied by her son and daughter who assist with the history. Patient reportedly lives with her son who is at the bedside and had been in her usual state until approximately 4 days ago when she seemed to be generally weak and was refusing food and drink. She has continuedto exhibit increasing generalized weakness and continues to refuse any food or drink, and has not been taking her medications.   Patient admitted with Failure to thrive. She was found to have constipation. She was treated with laxatives. She has had multiples BM, today she is having diarrhea. Patient had CT abdomen which was negative for acute pathology.  Overnight 12-11 patient has develops urinary retention and confusion. Foley catheter has been place. Will order MRI brain to evaluate acute encephalopathy.    Assessment & Plan: Principal Problem:   Acute kidney injury (HCC) Active Problems:   Essential hypertension   GERD   Constipation   General weakness   Atrial fibrillation with RVR (HCC)   Hypercalcemia   Chronic atrial fibrillation (HCC)   Gastric ulcer   Hypokalemia   Loss of appetite   Leukocytosis   AKI (acute kidney injury) (HCC)   Dehydration   Acute kidney injury  - SCr was 2.21 on admission, up from 0.77 two weeks prior  - improving with IV fluids, cr has decreased to 1.00 - continue with IV fluids. Improved.  -Patient has develops urinary retention. She has foley catheter place  12-11  Generalized weakness, loss of appetite, early satiety, FTT - Patient was evaluated by PCP with normal TSH on 03/11/16 continue with IV fluids.  - urine culture no growth.  - CT abdomen pelvis with no acute abnormalities. GI evaluated patient , and they don't have further recommendations.  -B-12 1029 and Vitamin D 52.     Acute encephalopathy - patient very confused 12/12 am, didn't recognized daughter, agitated at times. Will get MRI brain.  - d/w daughter 12/13, this is new for patient, usually AxO and appropriate at home. There is mentioning of dementia however daughter vehemently denies - repeat UA, obtain ammonia. Etiology not clear right now  Constipation  - Dehydration and calcium supplements likely aggravating  - treated with with miralax BID, lactulose, dulcolax suppository .  - improved  Chronic atrial fibrillation  - CHADS-VASc is at least 585 (age x2, gender, hx CVA x2) - She is not anticoagulated d/t GI bleeds, recent admission 2 weeks ago.  - continue with Cardizem., PRN lopressor ordered   GERD, gastric ulcer  - EGD from November 2017 with esophageal ulcer and gastric ulcer - Continue with protonix.   Leukocytosis  - WBC is 13,000 on admission without fever or apparent source of infection  - CXR clear and UA not suggestive of infection; no upper resp sxs and no vomiting or diarrhea  - WBC fluctuates.  - stopped ceftriaxone.   Hypercalcemia; resolved.  - Serum calcium is 11.9 on admission in setting of marked dehydration and calcium supplements  - She was treated with fluid and calcium supplements will be held  -  improved  Leukocytosis - CT abdomen no evidence of infection. Stop ceftriaxone.   Hypokalemia  - ongoing repletion today   DVT prophylaxis: SCD Code Status: Full Family Communication: d/w daughter bedside Disposition Plan: SNF when able  Consultants:   None   Procedures:   None   Antimicrobials:  Ceftriaxone 12/11 x 1  dose   Subjective: - confused, not following commands  Objective: Vitals:   03/17/16 0500 03/17/16 0519 03/17/16 1326 03/17/16 1413  BP:  (!) 152/79 119/77 120/72  Pulse:  79 94 82  Resp:    18  Temp:    97.8 F (36.6 C)  TempSrc:    Oral  SpO2:    98%  Weight: 43.4 kg (95 lb 11.2 oz)       Intake/Output Summary (Last 24 hours) at 03/17/16 1418 Last data filed at 03/17/16 0400  Gross per 24 hour  Intake             52.5 ml  Output             1520 ml  Net          -1467.5 ml   Filed Weights   03/14/16 0504 03/15/16 0500 03/17/16 0500  Weight: 43.1 kg (95 lb) 43 kg (94 lb 12.8 oz) 43.4 kg (95 lb 11.2 oz)    Examination: Constitutional: confused Vitals:   03/17/16 0500 03/17/16 0519 03/17/16 1326 03/17/16 1413  BP:  (!) 152/79 119/77 120/72  Pulse:  79 94 82  Resp:    18  Temp:    97.8 F (36.6 C)  TempSrc:    Oral  SpO2:    98%  Weight: 43.4 kg (95 lb 11.2 oz)      Eyes:  lids and conjunctivae normal Respiratory: clear to auscultation bilaterally, no wheezing, no crackles. Normal respiratory effort. No accessory muscle use.  Cardiovascular: Regular rate and rhythm, no murmurs / rubs / gallops.  Abdomen: no tenderness. Bowel sounds positive.  Musculoskeletal: no clubbing / cyanosis.  Skin: no rashes, lesions, ulcers. No induration Neurologic: non focal, moves all 4   Data Reviewed: I have personally reviewed following labs and imaging studies  CBC:  Recent Labs Lab 03/13/16 0523 03/14/16 0440 03/15/16 0648 03/16/16 0604 03/17/16 0223  WBC 11.8* 14.0* 14.0* 11.0* 10.2  HGB 10.8* 10.5* 10.3* 10.9* 9.7*  HCT 33.8* 33.5* 32.9* 34.3* 30.6*  MCV 91.8 90.5 89.6 88.9 89.0  PLT 415* 453* 372 386 367   Basic Metabolic Panel:  Recent Labs Lab 03/11/16 2340 03/12/16 0548 03/13/16 0523 03/14/16 0440 03/15/16 0648 03/16/16 0604 03/17/16 0223  NA  --  150* 135 135 137 138 138  K  --  3.3* 3.3* 4.2 3.2* 3.8 3.0*  CL  --  110 99* 101 102 102 104  CO2   --  28 25 24 23 25 27   GLUCOSE  --  126* 139* 84 111* 85 112*  BUN  --  32* 23* 19 14 8 7   CREATININE  --  1.87* 1.54* 1.47* 1.21* 1.02* 1.00  CALCIUM  --  10.9* 9.6 9.2 8.4* 8.5* 7.9*  MG 2.2 2.3  --   --   --   --   --    GFR: Estimated Creatinine Clearance: 27.5 mL/min (by C-G formula based on SCr of 1 mg/dL). Liver Function Tests:  Recent Labs Lab 03/11/16 1203 03/11/16 1715 03/12/16 0548  AST 35 60* 46*  ALT 29 32 34  ALKPHOS 48 41 43  BILITOT 0.6 1.2 0.6  PROT 6.6 5.8* 6.3*  ALBUMIN 3.1* 2.9* 2.9*    Recent Labs Lab 03/11/16 1715  LIPASE <10*   No results for input(s): AMMONIA in the last 168 hours. Coagulation Profile: No results for input(s): INR, PROTIME in the last 168 hours. Cardiac Enzymes: No results for input(s): CKTOTAL, CKMB, CKMBINDEX, TROPONINI in the last 168 hours. BNP (last 3 results) No results for input(s): PROBNP in the last 8760 hours. HbA1C: No results for input(s): HGBA1C in the last 72 hours. CBG:  Recent Labs Lab 03/13/16 0845 03/14/16 0817 03/15/16 0811 03/16/16 0754 03/17/16 0825  GLUCAP 106* 97 117* 80 107*   Lipid Profile: No results for input(s): CHOL, HDL, LDLCALC, TRIG, CHOLHDL, LDLDIRECT in the last 72 hours. Thyroid Function Tests: No results for input(s): TSH, T4TOTAL, FREET4, T3FREE, THYROIDAB in the last 72 hours. Anemia Panel:  Recent Labs  03/15/16 1227  VITAMINB12 1,029*   Urine analysis:    Component Value Date/Time   COLORURINE YELLOW 03/11/2016 1855   APPEARANCEUR CLEAR 03/11/2016 1855   LABSPEC 1.011 03/11/2016 1855   PHURINE 7.0 03/11/2016 1855   GLUCOSEU NEGATIVE 03/11/2016 1855   GLUCOSEU NEGATIVE 02/13/2016 1201   HGBUR NEGATIVE 03/11/2016 1855   BILIRUBINUR NEGATIVE 03/11/2016 1855   KETONESUR NEGATIVE 03/11/2016 1855   PROTEINUR 30 (A) 03/11/2016 1855   UROBILINOGEN 0.2 02/13/2016 1201   NITRITE NEGATIVE 03/11/2016 1855   LEUKOCYTESUR TRACE (A) 03/11/2016 1855   Sepsis Labs: Invalid  input(s): PROCALCITONIN, LACTICIDVEN  Recent Results (from the past 240 hour(s))  Urine culture     Status: Abnormal   Collection Time: 03/11/16  6:55 PM  Result Value Ref Range Status   Specimen Description URINE, RANDOM  Final   Special Requests NONE  Final   Culture (A)  Final    20,000 COLONIES/mL LACTOBACILLUS SPECIES Standardized susceptibility testing for this organism is not available.    Report Status 03/13/2016 FINAL  Final      Radiology Studies: Ct Abdomen Pelvis Wo Contrast  Result Date: 03/15/2016 CLINICAL DATA:  Abdominal pain.  History of diverticulosis. EXAM: CT ABDOMEN AND PELVIS WITHOUT CONTRAST TECHNIQUE: Multidetector CT imaging of the abdomen and pelvis was performed following the standard protocol without IV contrast. COMPARISON:  None. FINDINGS: Lower chest: Partially calcified pleural plaques are again demonstrated. Hepatobiliary: No focal hepatic lesions noncontrast exam. Under gallstones noted within a nondistended gallbladder. Pancreas: Pancreatic head is poorly evaluated on this noncontrast exam. Body and tail appear normal. No duct dilatation. Spleen: Normal spleen Adrenals/urinary tract: Adrenal glands and kidneys are normal. The ureters and bladder normal. Stomach/Bowel: Small hiatal hernia. Duodenum small bowel are normal. RIGHT colon is normal. Several scattered diverticular of the LEFT colon. Extensive diverticulosis of the sigmoid colon. No acute inflammation Vascular/Lymphatic: Iliac stents in the iliac arteries. Extensive vascular calcification Reproductive: Uterus and ovaries normal Other: No free fluid. Musculoskeletal: No aggressive osseous lesion. IMPRESSION: 1. No acute findings the abdomen pelvis. 2. Extensive sigmoid diverticulosis without evidence of diverticulitis. 3.  Atherosclerotic calcification of the aorta.  Iliac stents noted 4. Cholelithiasis without cholecystitis. 5. Pancreas head poorly evaluated on noncontrast exam. 6. Partially calcified  pleural plaques. Electronically Signed   By: Genevive Bi M.D.   On: 03/15/2016 17:10   Dg Chest Port 1 View  Result Date: 03/16/2016 CLINICAL DATA:  Status post catheter placement. EXAM: PORTABLE CHEST 1 VIEW COMPARISON:  Radiographs of March 11, 2016. FINDINGS: Stable cardiomegaly. Atherosclerosis of thoracic aorta is noted. No pneumothorax or  pleural effusion is noted. Stable calcified pleural plaques are noted. Interval placement of right-sided PICC line with distal tip in expected position of cavoatrial junction. No acute pulmonary disease is noted. Stable sclerotic density seen in proximal right humerus most consistent with enchondroma. IMPRESSION: Aortic atherosclerosis. Interval placement of right-sided PICC line with distal tip in expected position of cavoatrial junction. No pneumothorax is noted. Electronically Signed   By: Lupita RaiderJames  Green Jr, M.D.   On: 03/16/2016 18:02     Scheduled Meds: . aspirin  325 mg Oral Daily  . atorvastatin  5 mg Oral q1800  . diltiazem  45 mg Oral Q6H  . feeding supplement (ENSURE ENLIVE)  237 mL Oral BID BM  . multivitamin  1 tablet Oral BID  . nystatin  5 mL Oral QID  . oxybutynin  2.5 mg Oral BID  . pantoprazole sodium  40 mg Oral BID  . pentoxifylline  400 mg Oral q morning - 10a  . polyethylene glycol  17 g Oral Daily  . sodium chloride flush  3 mL Intravenous Q12H   Continuous Infusions: . dextrose 75 mL/hr at 03/17/16 0538     Tracy Pertostin Araly Kaas, MD, PhD [facility] Pager [phone]  If 7PM-7AM, please contact night-coverage [email or web address] Password [email or web address] 03/17/2016, 2:18 PM

## 2016-03-17 NOTE — Consult Note (Signed)
   [facility]   03/17/2016  [name] 1926/11/26 [id number]    [facility]. Chart reviewed. Noted current discharge plan is for SNF.  [facility] are [facility] at [facility]. If patient's post hospital needs change, please place a [facility]. Spoke with inpatient RNCM and inpatient LCSW to discuss. For questions please contact:  Tracy Nobletika Oddis Westling, MSN-Ed, RN,BSN [facility] Care [facility] Hospital Liaison [phone]

## 2016-03-17 NOTE — Progress Notes (Addendum)
Physical Therapy Treatment Patient Details Name: Horton FinerOphelia W Stidd MRN: [id number] DOB: 01-08-1927 Today's Date: 03/17/2016    History of Present Illness is a 82 y.o. female with medical history significant for dementia, history of CVA, chronic atrial fibrillation not anticoagulated due to GI bleeding, hypertension, and peripheral vascular disease who presents to the emergency department at the direction of her PCP for evaluation of generalized weakness with food refusal. basic blood work revealed an acute kidney injury and critical hypokalemia.    PT Comments    Pt presents with need for increased assistance to perform functional mobility.  Pt's mobility and balance is regressing.  Pt now presents with need for SNF placement, as family cannot provide support that is needed at this time.  Will inform supervising PT of need for change in recommendations.    Follow Up Recommendations  SNF;Supervision/Assistance - 24 hour     Equipment Recommendations   (TBA at next venue)    Recommendations for Other Services       Precautions / Restrictions Precautions Precautions: Fall Restrictions Weight Bearing Restrictions: No    Mobility  Bed Mobility Overal bed mobility: Needs Assistance Bed Mobility: Supine to Sit;Sit to Supine     Supine to sit: Max assist;+2 for physical assistance Sit to supine: Max assist;+2 for physical assistance   General bed mobility comments: Increased assist with increased confusion and lethargic presentation.    Transfers Overall transfer level: Needs assistance Equipment used: None Transfers: Sit to/from Stand Sit to Stand: Max assist;+2 safety/equipment Stand pivot transfers: Total assist;+2 physical assistance       General transfer comment: PTA assisted patient to stand and pivot to recliner chair while NT performed perianal care.  Pt lethargic and less responsive to treatment.    Ambulation/Gait Ambulation/Gait assistance:  (unable.  )               Stairs            Wheelchair Mobility    Modified Rankin (Stroke Patients Only)       Balance     Sitting balance-Leahy Scale: Zero       Standing balance-Leahy Scale: Zero                      Cognition Arousal/Alertness: Lethargic;Suspect due to medications Behavior During Therapy: Flat affect Overall Cognitive Status: Impaired/Different from baseline Area of Impairment: Attention;Memory;Following commands;Safety/judgement;Awareness;Problem solving     Memory: Decreased recall of precautions;Decreased short-term memory Following Commands: Follows one step commands inconsistently Safety/Judgement: Decreased awareness of safety;Decreased awareness of deficits   Problem Solving: Slow processing;Decreased initiation;Difficulty sequencing;Requires verbal cues;Requires tactile cues General Comments: Pt lethargic and speech unintelligible with moans and groans.  pt repeatedly returning to supine.      Exercises      General Comments        Pertinent Vitals/Pain Pain Assessment: No/denies pain    Home Living                      Prior Function            PT Goals (current goals can now be found in the care plan section) Acute Rehab PT Goals Patient Stated Goal: Unable to state.   Potential to Achieve Goals: Good Progress towards PT goals: Progressing toward goals    Frequency    Min 3X/week      PT Plan Discharge plan needs to be updated    Co-evaluation  End of Session Equipment Utilized During Treatment: Gait belt Activity Tolerance: Patient tolerated treatment well Patient left: in chair;with call bell/phone within reach;with family/visitor present     Time: [id number] PT Time Calculation (min) (ACUTE ONLY): 19 min  Charges:  $Therapeutic Activity: 8-22 mins                    G Codes:      Florestine Aversimee J Azaylah Stailey 03/17/2016, 4:54 PM  Joycelyn RuaAimee Aryan Bello, PTA pager [phone]

## 2016-03-17 NOTE — Progress Notes (Signed)
CSW provided bed offers to patient son at bedside- he would like [facility].  Still would like to take patient home if she improves but on board for SNF if she continues to be as weak as she has been.  Burna SisJenna H. Marcellina Jonsson, LCSW Clinical Social Worker [phone]

## 2016-03-18 LAB — CBC
HEMATOCRIT: 30.7 % — AB (ref 36.0–46.0)
HEMOGLOBIN: 9.6 g/dL — AB (ref 12.0–15.0)
MCH: 27.9 pg (ref 26.0–34.0)
MCHC: 31.3 g/dL (ref 30.0–36.0)
MCV: 89.2 fL (ref 78.0–100.0)
Platelets: 369 10*3/uL (ref 150–400)
RBC: 3.44 MIL/uL — AB (ref 3.87–5.11)
RDW: 16 % — ABNORMAL HIGH (ref 11.5–15.5)
WBC: 11.5 10*3/uL — ABNORMAL HIGH (ref 4.0–10.5)

## 2016-03-18 LAB — COMPREHENSIVE METABOLIC PANEL
ALK PHOS: 42 U/L (ref 38–126)
ALT: 24 U/L (ref 14–54)
ANION GAP: 6 (ref 5–15)
AST: 29 U/L (ref 15–41)
Albumin: 2.3 g/dL — ABNORMAL LOW (ref 3.5–5.0)
BUN: <5 mg/dL — ABNORMAL LOW (ref 6–20)
CALCIUM: 7.7 mg/dL — AB (ref 8.9–10.3)
CO2: 26 mmol/L (ref 22–32)
Chloride: 105 mmol/L (ref 101–111)
Creatinine, Ser: 0.94 mg/dL (ref 0.44–1.00)
GFR calc non Af Amer: 53 mL/min — ABNORMAL LOW (ref >60)
Glucose, Bld: 103 mg/dL — ABNORMAL HIGH (ref 65–99)
POTASSIUM: 3.8 mmol/L (ref 3.5–5.1)
SODIUM: 137 mmol/L (ref 135–145)
Total Bilirubin: 0.5 mg/dL (ref 0.3–1.2)
Total Protein: 5.2 g/dL — ABNORMAL LOW (ref 6.5–8.1)

## 2016-03-18 LAB — MAGNESIUM: MAGNESIUM: 1.5 mg/dL — AB (ref 1.7–2.4)

## 2016-03-18 LAB — GLUCOSE, CAPILLARY: Glucose-Capillary: 101 mg/dL — ABNORMAL HIGH (ref 65–99)

## 2016-03-18 LAB — PROTIME-INR
INR: 1.29
PROTHROMBIN TIME: 16.1 s — AB (ref 11.4–15.2)

## 2016-03-18 MED ORDER — MAGNESIUM SULFATE 2 GM/50ML IV SOLN
2.0000 g | Freq: Once | INTRAVENOUS | Status: AC
  Administered 2016-03-18: 2 g via INTRAVENOUS
  Filled 2016-03-18: qty 50

## 2016-03-18 NOTE — Progress Notes (Addendum)
MD paged for 8 beat run Vtach. Patient asleep and denied symptoms. BP 144/52. Currently Afib HR 77

## 2016-03-18 NOTE — Progress Notes (Signed)
PROGRESS NOTE  Tracy [name] GNF:[id number] DOB: 10-16-1926 DOA: 03/11/2016 PCP: Tracy Barre, MD   LOS: 5 days   Brief Narrative: Tracy [name] a 82 y.o.femalewith medical history significant for dementia, history of CVA, chronic atrial fibrillation not anticoagulated due to GI bleeding, hypertension, and peripheral vascular disease who presents to the emergency department at the direction of her PCP for evaluation of generalized weakness with food refusal and lab abnormalities. Patient is accompanied by her son and daughter who assist with the history. Patient reportedly lives with her son who is at the bedside and had been in her usual state until approximately 4 days ago when she seemed to be generally weak and was refusing food and drink. She has continuedto exhibit increasing generalized weakness and continues to refuse any food or drink, and has not been taking her medications.   Patient admitted with Failure to thrive. She was found to have constipation. She was treated with laxatives. She has had multiples BM, today she is having diarrhea. Patient had CT abdomen which was negative for acute pathology.  Overnight 12-11 patient has develops urinary retention and confusion.    Assessment & Plan: Principal Problem:   Acute kidney injury (HCC) Active Problems:   Essential hypertension   GERD   Constipation   General weakness   Atrial fibrillation with RVR (HCC)   Hypercalcemia   Chronic atrial fibrillation (HCC)   Gastric ulcer   Hypokalemia   Loss of appetite   Leukocytosis   AKI (acute kidney injury) (HCC)   Dehydration   Acute kidney injury  - SCr was 2.21 on admission, up from 0.77 two weeks prior  - improving with IV fluids, cr has decreased to 0.94 this morning  - hold IVF now. She is eating.   Generalized weakness, loss of appetite, early satiety, FTT - Patient was evaluated by PCP with normal TSH on 03/11/16 continue with IV fluids.  - urine culture no  growth.  - CT abdomen pelvis with no acute abnormalities. GI evaluated patient , and they don't have further recommendations.  - B-12 1029 and Vitamin D 52.  - MRI brain with poor quality but essentially without acute findings  Acute encephalopathy - patient very confused 12/12 am, didn't recognized daughter, agitated at times. - d/w daughter 12/13, this is new for patient, usually AxO and appropriate at home. There is mentioning of dementia however daughter vehemently denies - MRI brain with poor quality but essentially without acute findings - ?medication related as she had a Norco the night prior, now improving. If continues to improve SNF tomorrow.   Constipation  - Dehydration and calcium supplements likely aggravating  - treated with with miralax BID, lactulose, dulcolax suppository .  - improved  Chronic atrial fibrillation  - CHADS-VASc is at least 49 (age x2, gender, hx CVA x2) - She is not anticoagulated d/t GI bleeds, recent admission 2 weeks ago.  - continue with Cardizem., PRN lopressor ordered   GERD, gastric ulcer  - EGD from November 2017 with esophageal ulcer and gastric ulcer - Continue with protonix.   Leukocytosis  - WBC is 13,000 on admission without fever or apparent source of infection  - CXR clear and UA not suggestive of infection; no upper resp sxs and no vomiting or diarrhea  - WBC fluctuates.  - stopped ceftriaxone.   Hypercalcemia; resolved.  - Serum calcium is 11.9 on admission in setting of marked dehydration and calcium supplements  - She was treated with fluid  and calcium supplements will be held  - improved  Leukocytosis - CT abdomen no evidence of infection. Stop ceftriaxone.   Hypokalemia  - ongoing repletion today   DVT prophylaxis: SCD Code Status: Full Family Communication: d/w daughter bedside Disposition Plan: SNF 1 day if stable  Consultants:   None   Procedures:   None   Antimicrobials:  Ceftriaxone 12/11 x 1  dose   Subjective: - more alert, knows where she is, a lot more conversant when compared to yesterday. Not oriented to time.  Objective: Vitals:   03/17/16 1755 03/18/16 0500 03/18/16 0744 03/18/16 1030  BP: (!) 116/50 120/71 (!) 144/52   Pulse: 74 70 (!) 53 80  Resp:  18    Temp:  98 F (36.7 C)    TempSrc:  Oral    SpO2:  99%    Weight:        Intake/Output Summary (Last 24 hours) at 03/18/16 1107 Last data filed at 03/18/16 0911  Gross per 24 hour  Intake           911.75 ml  Output             1950 ml  Net         -1038.25 ml   Filed Weights   03/14/16 0504 03/15/16 0500 03/17/16 0500  Weight: 43.1 kg (95 lb) 43 kg (94 lb 12.8 oz) 43.4 kg (95 lb 11.2 oz)    Examination: Constitutional: NAD, smiling, eating breakfast  Vitals:   03/17/16 1755 03/18/16 0500 03/18/16 0744 03/18/16 1030  BP: (!) 116/50 120/71 (!) 144/52   Pulse: 74 70 (!) 53 80  Resp:  18    Temp:  98 F (36.7 C)    TempSrc:  Oral    SpO2:  99%    Weight:       Eyes:  lids and conjunctivae normal Respiratory: clear to auscultation bilaterally, no wheezing, no crackles. Normal respiratory effort. No accessory muscle use.  Cardiovascular: Regular rate and rhythm, no murmurs / rubs / gallops.  Abdomen: no tenderness. Bowel sounds positive.  Musculoskeletal: no clubbing / cyanosis.  Skin: no rashes, lesions, ulcers. No induration Neurologic: non focal, moves all 4  Data Reviewed: I have personally reviewed following labs and imaging studies  CBC:  Recent Labs Lab 03/14/16 0440 03/15/16 0648 03/16/16 0604 03/17/16 0223 03/18/16 0415  WBC 14.0* 14.0* 11.0* 10.2 11.5*  HGB 10.5* 10.3* 10.9* 9.7* 9.6*  HCT 33.5* 32.9* 34.3* 30.6* 30.7*  MCV 90.5 89.6 88.9 89.0 89.2  PLT 453* 372 386 367 369   Basic Metabolic Panel:  Recent Labs Lab 03/11/16 2340 03/12/16 0548  03/14/16 0440 03/15/16 0648 03/16/16 0604 03/17/16 0223 03/18/16 0415 03/18/16 0909  NA  --  150*  < > 135 137 138 138  137  --   K  --  3.3*  < > 4.2 3.2* 3.8 3.0* 3.8  --   CL  --  110  < > 101 102 102 104 105  --   CO2  --  28  < > 24 23 25 27 26   --   GLUCOSE  --  126*  < > 84 111* 85 112* 103*  --   BUN  --  32*  < > 19 14 8 7  <5*  --   CREATININE  --  1.87*  < > 1.47* 1.21* 1.02* 1.00 0.94  --   CALCIUM  --  10.9*  < > 9.2 8.4* 8.5* 7.9*  7.7*  --   MG 2.2 2.3  --   --   --   --   --   --  1.5*  < > = values in this interval not displayed. GFR: Estimated Creatinine Clearance: 29.3 mL/min (by C-G formula based on SCr of 0.94 mg/dL). Liver Function Tests:  Recent Labs Lab 03/11/16 1203 03/11/16 1715 03/12/16 0548 03/18/16 0415  AST 35 60* 46* 29  ALT 29 32 34 24  ALKPHOS 48 41 43 42  BILITOT 0.6 1.2 0.6 0.5  PROT 6.6 5.8* 6.3* 5.2*  ALBUMIN 3.1* 2.9* 2.9* 2.3*    Recent Labs Lab 03/11/16 1715  LIPASE <10*    Recent Labs Lab 03/17/16 1455  AMMONIA 34   Coagulation Profile:  Recent Labs Lab 03/18/16 0415  INR 1.29   Cardiac Enzymes: No results for input(s): CKTOTAL, CKMB, CKMBINDEX, TROPONINI in the last 168 hours. BNP (last 3 results) No results for input(s): PROBNP in the last 8760 hours. HbA1C: No results for input(s): HGBA1C in the last 72 hours. CBG:  Recent Labs Lab 03/14/16 0817 03/15/16 0811 03/16/16 0754 03/17/16 0825 03/18/16 0743  GLUCAP 97 117* 80 107* 101*   Lipid Profile: No results for input(s): CHOL, HDL, LDLCALC, TRIG, CHOLHDL, LDLDIRECT in the last 72 hours. Thyroid Function Tests: No results for input(s): TSH, T4TOTAL, FREET4, T3FREE, THYROIDAB in the last 72 hours. Anemia Panel:  Recent Labs  03/15/16 1227  VITAMINB12 1,029*   Urine analysis:    Component Value Date/Time   COLORURINE YELLOW 03/17/2016 1507   APPEARANCEUR CLEAR 03/17/2016 1507   LABSPEC 1.006 03/17/2016 1507   PHURINE 8.0 03/17/2016 1507   GLUCOSEU NEGATIVE 03/17/2016 1507   GLUCOSEU NEGATIVE 02/13/2016 1201   HGBUR NEGATIVE 03/17/2016 1507   BILIRUBINUR NEGATIVE  03/17/2016 1507   KETONESUR NEGATIVE 03/17/2016 1507   PROTEINUR NEGATIVE 03/17/2016 1507   UROBILINOGEN 0.2 02/13/2016 1201   NITRITE NEGATIVE 03/17/2016 1507   LEUKOCYTESUR SMALL (A) 03/17/2016 1507   Sepsis Labs: Invalid input(s): PROCALCITONIN, LACTICIDVEN  Recent Results (from the past 240 hour(s))  Urine culture     Status: Abnormal   Collection Time: 03/11/16  6:55 PM  Result Value Ref Range Status   Specimen Description URINE, RANDOM  Final   Special Requests NONE  Final   Culture (A)  Final    20,000 COLONIES/mL LACTOBACILLUS SPECIES Standardized susceptibility testing for this organism is not available.    Report Status 03/13/2016 FINAL  Final      Radiology Studies: Mr Brain Wo Contrast  Result Date: 03/17/2016 CLINICAL DATA:  Initial evaluation for acute confusion. EXAM: MRI HEAD WITHOUT CONTRAST TECHNIQUE: Multiplanar, multiecho pulse sequences of the brain and surrounding structures were obtained without intravenous contrast. COMPARISON:  Prior MRI from 02/07/2016. FINDINGS: Brain: Study markedly limited due to patient's inability to tolerate the full exam. Additionally, images provided are markedly degraded by motion artifact. Diffusion weighted imaging demonstrates no definite evidence for acute intracranial infarct. Cerebral atrophy with chronic microvascular ischemic disease is grossly stable. Similarly, remote bilateral occipital and left cerebellar infarcts also grossly stable. Remote lacunar infarcts again noted within the bilateral basal ganglia. No definite new acute intracranial process. Ventricles grossly stable in size without hydrocephalus. No definite mass lesion or midline shift. No extra-axial fluid collection. Vascular: Major intracranial vascular flow voids are grossly patent. Skull and upper cervical spine: Scalp soft tissues and calvarium grossly unremarkable. Craniocervical junction and skull base not well evaluated on this exam. Sinuses/Orbits: Orbital  soft tissues  and globes grossly unremarkable. Paranasal sinuses and mastoids are grossly clear. IMPRESSION: 1. Severely limited study due to extensive motion artifact and patient's inability to tolerate the full length of the exam. 2. No definite acute abnormality identified. If there remains high clinical suspicion for possible occult intracranial process, a repeat study when the patient is able to tolerate the exam could be attempted. Electronically Signed   By: Rise MuBenjamin  McClintock M.D.   On: 03/17/2016 21:56   Dg Chest Port 1 View  Result Date: 03/16/2016 CLINICAL DATA:  Status post catheter placement. EXAM: PORTABLE CHEST 1 VIEW COMPARISON:  Radiographs of March 11, 2016. FINDINGS: Stable cardiomegaly. Atherosclerosis of thoracic aorta is noted. No pneumothorax or pleural effusion is noted. Stable calcified pleural plaques are noted. Interval placement of right-sided PICC line with distal tip in expected position of cavoatrial junction. No acute pulmonary disease is noted. Stable sclerotic density seen in proximal right humerus most consistent with enchondroma. IMPRESSION: Aortic atherosclerosis. Interval placement of right-sided PICC line with distal tip in expected position of cavoatrial junction. No pneumothorax is noted. Electronically Signed   By: Lupita RaiderJames  Green Jr, M.D.   On: 03/16/2016 18:02     Scheduled Meds: . aspirin  325 mg Oral Daily  . atorvastatin  5 mg Oral q1800  . diltiazem  45 mg Oral Q6H  . feeding supplement (ENSURE ENLIVE)  237 mL Oral BID BM  . magnesium sulfate 1 - 4 g bolus IVPB  2 g Intravenous Once  . multivitamin  1 tablet Oral BID  . nystatin  5 mL Oral QID  . oxybutynin  2.5 mg Oral BID  . pantoprazole sodium  40 mg Oral BID  . pentoxifylline  400 mg Oral q morning - 10a  . polyethylene glycol  17 g Oral Daily  . sodium chloride flush  3 mL Intravenous Q12H   Continuous Infusions:    Tracy Pertostin Tayon Parekh, MD, PhD [facility] Pager [phone]  If  7PM-7AM, please contact night-coverage [email or web address] Password [email or web address] 03/18/2016, 11:07 AM

## 2016-03-19 ENCOUNTER — Inpatient Hospital Stay (HOSPITAL_COMMUNITY): Payer: Medicare Other

## 2016-03-19 LAB — GLUCOSE, CAPILLARY: GLUCOSE-CAPILLARY: 88 mg/dL (ref 65–99)

## 2016-03-19 MED ORDER — METOPROLOL TARTRATE 12.5 MG HALF TABLET
12.5000 mg | ORAL_TABLET | Freq: Two times a day (BID) | ORAL | Status: DC
  Administered 2016-03-19 – 2016-03-22 (×6): 12.5 mg via ORAL
  Filled 2016-03-19 (×7): qty 1

## 2016-03-19 MED ORDER — DILTIAZEM HCL ER COATED BEADS 180 MG PO CP24
180.0000 mg | ORAL_CAPSULE | Freq: Every day | ORAL | Status: DC
  Administered 2016-03-19 – 2016-03-22 (×3): 180 mg via ORAL
  Filled 2016-03-19 (×4): qty 1

## 2016-03-19 NOTE — Progress Notes (Signed)
Pt had a 4 beat run of PVC's. MD made aware. No new orders. Will continue to monitor.

## 2016-03-19 NOTE — Progress Notes (Signed)
PROGRESS NOTE  Tracy [name] ZOX:[id number] DOB: [birth year] DOA: 03/11/2016 PCP: Oliver BarreJames John, MD   LOS: 6 days   Brief Narrative: Tracy Gilbertphelia W Parsonis a 82 y.o.femalewith medical history significant for dementia, history of CVA, chronic atrial fibrillation not anticoagulated due to GI bleeding, hypertension, and peripheral vascular disease who presents to the emergency department at the direction of her PCP for evaluation of generalized weakness with food refusal and lab abnormalities. Patient is accompanied by her son and daughter who assist with the history. Patient reportedly lives with her son who is at the bedside and had been in her usual state until approximately 4 days ago when she seemed to be generally weak and was refusing food and drink. She has continuedto exhibit increasing generalized weakness and continues to refuse any food or drink, and has not been taking her medications.   Patient admitted with Failure to thrive. She was found to have constipation. She was treated with laxatives. She has had multiples BM, today she is having diarrhea. Patient had CT abdomen which was negative for acute pathology.  Overnight 12-11 patient has develops urinary retention and confusion.    Assessment & Plan: Principal Problem:   Acute kidney injury (HCC) Active Problems:   Essential hypertension   GERD   Constipation   General weakness   Atrial fibrillation with RVR (HCC)   Hypercalcemia   Chronic atrial fibrillation (HCC)   Gastric ulcer   Hypokalemia   Loss of appetite   Leukocytosis   AKI (acute kidney injury) (HCC)   Dehydration   Acute kidney injury  - SCr was 2.21 on admission, up from 0.77 two weeks prior  - improving with IV fluids, cr has decreased to 0.94 this morning  - hold IVF now. She is eating.   Generalized weakness, loss of appetite, early satiety, FTT - Patient was evaluated by PCP with normal TSH on 03/11/16 continue with IV fluids.  - urine culture no  growth.  - CT abdomen pelvis with no acute abnormalities. GI evaluated patient , and they don't have further recommendations.  - B-12 1029 and Vitamin D 52.  - MRI brain with poor quality but essentially without acute finding. Repeat pending  Acute encephalopathy - patient very confused 12/12 am, didn't recognized daughter, agitated at times. - d/w daughter 12/13, this is new for patient, usually AxO and appropriate at home. There is mentioning of dementia however daughter vehemently denies - MRI brain with poor quality but essentially without acute findings - persistent delirium today, d/w son bedside for extended period of time, encephalopathy is completely new. Consulted neurology, d/w with Dr. Cyril Mourningamillo, appreciate input.   Constipation  - Dehydration and calcium supplements likely aggravating  - treated with with miralax BID, lactulose, dulcolax suppository .  - improved  Chronic atrial fibrillation  - CHADS-VASc is at least 415 (age x2, gender, hx CVA x2) - She is not anticoagulated d/t GI bleeds, recent admission 2 weeks ago.  - continue with Cardizem., PRN lopressor ordered   GERD, gastric ulcer  - EGD from November 2017 with esophageal ulcer and gastric ulcer - Continue with protonix.   Leukocytosis  - WBC is 13,000 on admission without fever or apparent source of infection  - CXR clear and UA not suggestive of infection; no upper resp sxs and no vomiting or diarrhea  - WBC fluctuates.  - stopped ceftriaxone.   Hypercalcemia; resolved.  - Serum calcium is 11.9 on admission in setting of marked dehydration and calcium supplements  -  She was treated with fluid and calcium supplements will be held  - improved  Leukocytosis - CT abdomen no evidence of infection. Stop ceftriaxone.   Hypokalemia  - ongoing repletion today   DVT prophylaxis: SCD Code Status: Full Family Communication: d/w daughter bedside Disposition Plan: SNF 1 day if stable  Consultants:    None   Procedures:   None   Antimicrobials:  Ceftriaxone 12/11 x 1 dose   Subjective: - more alert, knows where she is, a lot more conversant when compared to yesterday. Not oriented to time.  Objective: Vitals:   03/19/16 0523 03/19/16 0900 03/19/16 1052 03/19/16 1345  BP: (!) 148/99 99/81 (!) 152/99 (!) 145/105  Pulse: 87  66 97  Resp: 18   16  Temp:    97.3 F (36.3 C)  TempSrc:    Oral  SpO2:    100%  Weight:        Intake/Output Summary (Last 24 hours) at 03/19/16 1516 Last data filed at 03/19/16 0930  Gross per 24 hour  Intake              100 ml  Output              775 ml  Net             -675 ml   Filed Weights   03/14/16 0504 03/15/16 0500 03/17/16 0500  Weight: 43.1 kg (95 lb) 43 kg (94 lb 12.8 oz) 43.4 kg (95 lb 11.2 oz)    Examination: Constitutional: NAD, confused Vitals:   03/19/16 0523 03/19/16 0900 03/19/16 1052 03/19/16 1345  BP: (!) 148/99 99/81 (!) 152/99 (!) 145/105  Pulse: 87  66 97  Resp: 18   16  Temp:    97.3 F (36.3 C)  TempSrc:    Oral  SpO2:    100%  Weight:       Eyes:  lids and conjunctivae normal Respiratory: clear to auscultation bilaterally, no wheezing, no crackles. Normal respiratory effort. No accessory muscle use.  Cardiovascular: Regular rate and rhythm, no murmurs / rubs / gallops.  Abdomen: no tenderness. Bowel sounds positive.  Musculoskeletal: no clubbing / cyanosis.  Neurologic: non focal, moves all 4  Data Reviewed: I have personally reviewed following labs and imaging studies  CBC:  Recent Labs Lab 03/14/16 0440 03/15/16 0648 03/16/16 0604 03/17/16 0223 03/18/16 0415  WBC 14.0* 14.0* 11.0* 10.2 11.5*  HGB 10.5* 10.3* 10.9* 9.7* 9.6*  HCT 33.5* 32.9* 34.3* 30.6* 30.7*  MCV 90.5 89.6 88.9 89.0 89.2  PLT 453* 372 386 367 369   Basic Metabolic Panel:  Recent Labs Lab 03/14/16 0440 03/15/16 0648 03/16/16 0604 03/17/16 0223 03/18/16 0415 03/18/16 0909  NA 135 137 138 138 137  --   K 4.2  3.2* 3.8 3.0* 3.8  --   CL 101 102 102 104 105  --   CO2 24 23 25 27 26   --   GLUCOSE 84 111* 85 112* 103*  --   BUN 19 14 8 7  <5*  --   CREATININE 1.47* 1.21* 1.02* 1.00 0.94  --   CALCIUM 9.2 8.4* 8.5* 7.9* 7.7*  --   MG  --   --   --   --   --  1.5*   GFR: Estimated Creatinine Clearance: 29.3 mL/min (by C-G formula based on SCr of 0.94 mg/dL). Liver Function Tests:  Recent Labs Lab 03/18/16 0415  AST 29  ALT 24  ALKPHOS 42  BILITOT 0.5  PROT 5.2*  ALBUMIN 2.3*   No results for input(s): LIPASE, AMYLASE in the last 168 hours.  Recent Labs Lab 03/17/16 1455  AMMONIA 34   Coagulation Profile:  Recent Labs Lab 03/18/16 0415  INR 1.29   Cardiac Enzymes: No results for input(s): CKTOTAL, CKMB, CKMBINDEX, TROPONINI in the last 168 hours. BNP (last 3 results) No results for input(s): PROBNP in the last 8760 hours. HbA1C: No results for input(s): HGBA1C in the last 72 hours. CBG:  Recent Labs Lab 03/15/16 0811 03/16/16 0754 03/17/16 0825 03/18/16 0743 03/19/16 0744  GLUCAP 117* 80 107* 101* 88   Lipid Profile: No results for input(s): CHOL, HDL, LDLCALC, TRIG, CHOLHDL, LDLDIRECT in the last 72 hours. Thyroid Function Tests: No results for input(s): TSH, T4TOTAL, FREET4, T3FREE, THYROIDAB in the last 72 hours. Anemia Panel: No results for input(s): VITAMINB12, FOLATE, FERRITIN, TIBC, IRON, RETICCTPCT in the last 72 hours. Urine analysis:    Component Value Date/Time   COLORURINE YELLOW 03/17/2016 1507   APPEARANCEUR CLEAR 03/17/2016 1507   LABSPEC 1.006 03/17/2016 1507   PHURINE 8.0 03/17/2016 1507   GLUCOSEU NEGATIVE 03/17/2016 1507   GLUCOSEU NEGATIVE 02/13/2016 1201   HGBUR NEGATIVE 03/17/2016 1507   BILIRUBINUR NEGATIVE 03/17/2016 1507   KETONESUR NEGATIVE 03/17/2016 1507   PROTEINUR NEGATIVE 03/17/2016 1507   UROBILINOGEN 0.2 02/13/2016 1201   NITRITE NEGATIVE 03/17/2016 1507   LEUKOCYTESUR SMALL (A) 03/17/2016 1507   Sepsis Labs: Invalid  input(s): PROCALCITONIN, LACTICIDVEN  Recent Results (from the past 240 hour(s))  Urine culture     Status: Abnormal   Collection Time: 03/11/16  6:55 PM  Result Value Ref Range Status   Specimen Description URINE, RANDOM  Final   Special Requests NONE  Final   Culture (A)  Final    20,000 COLONIES/mL LACTOBACILLUS SPECIES Standardized susceptibility testing for this organism is not available.    Report Status 03/13/2016 FINAL  Final      Radiology Studies: Mr Brain Wo Contrast  Result Date: 03/17/2016 CLINICAL DATA:  Initial evaluation for acute confusion. EXAM: MRI HEAD WITHOUT CONTRAST TECHNIQUE: Multiplanar, multiecho pulse sequences of the brain and surrounding structures were obtained without intravenous contrast. COMPARISON:  Prior MRI from 02/07/2016. FINDINGS: Brain: Study markedly limited due to patient's inability to tolerate the full exam. Additionally, images provided are markedly degraded by motion artifact. Diffusion weighted imaging demonstrates no definite evidence for acute intracranial infarct. Cerebral atrophy with chronic microvascular ischemic disease is grossly stable. Similarly, remote bilateral occipital and left cerebellar infarcts also grossly stable. Remote lacunar infarcts again noted within the bilateral basal ganglia. No definite new acute intracranial process. Ventricles grossly stable in size without hydrocephalus. No definite mass lesion or midline shift. No extra-axial fluid collection. Vascular: Major intracranial vascular flow voids are grossly patent. Skull and upper cervical spine: Scalp soft tissues and calvarium grossly unremarkable. Craniocervical junction and skull base not well evaluated on this exam. Sinuses/Orbits: Orbital soft tissues and globes grossly unremarkable. Paranasal sinuses and mastoids are grossly clear. IMPRESSION: 1. Severely limited study due to extensive motion artifact and patient's inability to tolerate the full length of the exam.  2. No definite acute abnormality identified. If there remains high clinical suspicion for possible occult intracranial process, a repeat study when the patient is able to tolerate the exam could be attempted. Electronically Signed   By: Rise MuBenjamin  McClintock M.D.   On: 03/17/2016 21:56     Scheduled Meds: . aspirin  325 mg Oral Daily  .  atorvastatin  5 mg Oral q1800  . diltiazem  180 mg Oral Daily  . feeding supplement (ENSURE ENLIVE)  237 mL Oral BID BM  . metoprolol tartrate  12.5 mg Oral BID  . multivitamin  1 tablet Oral BID  . nystatin  5 mL Oral QID  . oxybutynin  2.5 mg Oral BID  . pantoprazole sodium  40 mg Oral BID  . pentoxifylline  400 mg Oral q morning - 10a  . polyethylene glycol  17 g Oral Daily  . sodium chloride flush  3 mL Intravenous Q12H   Continuous Infusions:  Time spent: 35 minutes, more than 50% bedside discussing with patient's son and answering multiple questions   Pamella Pert, MD, PhD [facility] Pager [phone]  If 7PM-7AM, please contact night-coverage [email or web address] Password [email or web address] 03/19/2016, 3:16 PM

## 2016-03-19 NOTE — Progress Notes (Signed)
Physical Therapy Treatment Patient Details Name: Tracy [name] MRN: [id number] DOB: 1926-09-26 Today's Date: 03/19/2016    History of Present Illness is a 82 y.o. female with medical history significant for dementia, history of CVA, chronic atrial fibrillation not anticoagulated due to GI bleeding, hypertension, and peripheral vascular disease who presents to the emergency department at the direction of her PCP for evaluation of generalized weakness with food refusal. basic blood work revealed an acute kidney injury and critical hypokalemia.    PT Comments    Pt performed increased mobility.  Presents with incontinence in halls during gait training.  Pt able to progress mobility but would continue to benefit from rehab in a post acute setting (SNF) to improve strength and mobility and be more consistent with her ability to mobilize.    Follow Up Recommendations  SNF;Supervision/Assistance - 24 hour     Equipment Recommendations   (TBA at next venue)    Recommendations for Other Services       Precautions / Restrictions Precautions Precautions: Fall Restrictions Weight Bearing Restrictions: No    Mobility  Bed Mobility Overal bed mobility: Needs Assistance Bed Mobility: Supine to Sit;Sit to Supine     Supine to sit: Min assist     General bed mobility comments: Cues for hand placement and LE advancement to edge of bed.    Transfers Overall transfer level: Needs assistance Equipment used: Rolling walker (2 wheeled) Transfers: Sit to/from Stand Sit to Stand: Min assist Stand pivot transfers: Min assist       General transfer comment: Cues for hand placement to and from seated surface.    Ambulation/Gait Ambulation/Gait assistance: Min assist Ambulation Distance (Feet): 110 Feet Assistive device: Rolling walker (2 wheeled) Gait Pattern/deviations: Step-through pattern;Shuffle;Trunk flexed;Drifts right/left Gait velocity: decr Gait velocity interpretation:  Below normal speed for age/gender General Gait Details: Pt drifts to R side of RW and required frequent redirection to maintain proximity due to RW.  Pt responds better to short direct commands.  Cues to avoid obstacles in halls.  pt had BM in hall way.     Stairs            Wheelchair Mobility    Modified Rankin (Stroke Patients Only)       Balance Overall balance assessment: Needs assistance   Sitting balance-Leahy Scale: Fair       Standing balance-Leahy Scale: Poor                      Cognition Arousal/Alertness: Awake/alert Behavior During Therapy: Impulsive Overall Cognitive Status: History of cognitive impairments - at baseline Area of Impairment: Attention;Memory;Following commands;Safety/judgement;Awareness;Problem solving;Orientation Orientation Level: Disoriented to;Time;Situation;Place   Memory: Decreased recall of precautions;Decreased short-term memory Following Commands: Follows one step commands inconsistently Safety/Judgement: Decreased awareness of safety;Decreased awareness of deficits   Problem Solving: Requires verbal cues;Requires tactile cues General Comments: Pt calling therapist different names of family members.      Exercises      General Comments        Pertinent Vitals/Pain Pain Assessment: No/denies pain    Home Living                      Prior Function            PT Goals (current goals can now be found in the care plan section) Acute Rehab PT Goals Patient Stated Goal: To get her hair done  Potential to Achieve Goals: Good Progress towards PT  goals: Progressing toward goals    Frequency    Min 3X/week      PT Plan Current plan remains appropriate    Co-evaluation             End of Session Equipment Utilized During Treatment: Gait belt Activity Tolerance: Patient tolerated treatment well Patient left: in chair;with call bell/phone within reach;with family/visitor present      Time: [id number] PT Time Calculation (min) (ACUTE ONLY): 27 min  Charges:  $Gait Training: 8-22 mins $Therapeutic Activity: 8-22 mins                    G Codes:      Florestine Aversimee J Laquanta Hummel 03/19/2016, 4:27 PM  Joycelyn RuaAimee Dredyn Gubbels, PTA pager [phone]

## 2016-03-19 NOTE — Progress Notes (Signed)
PT Cancellation Note  Patient Details Name: Tracy FinerOphelia W [name] MRN: [id number] DOB: 07-21-1926   Cancelled Treatment:     Pt off unit for testing.  Will continue efforts as time permits.     Lurleen Soltero Artis DelayJ Basem Yannuzzi 03/19/2016, 2:58 PM Joycelyn RuaAimee Chastelyn [place], PTA pager [phone]

## 2016-03-19 NOTE — Progress Notes (Signed)
EEG Completed; Results Pending  

## 2016-03-19 NOTE — Progress Notes (Signed)
Pt agreed to take diltiazem, metoprolol, and nystatin at this time.

## 2016-03-19 NOTE — Progress Notes (Signed)
Pt refused meds and assessment again.

## 2016-03-19 NOTE — Progress Notes (Signed)
Pt is refusing morning meds and to be assessed. MD made aware. Will attempt later.

## 2016-03-19 NOTE — Consult Note (Signed)
NEURO HOSPITALIST CONSULT NOTE   Requesting physician: Dr Cruzita Lederer Reason for Consult: altered mental status  HPI:                                                                                                                                          Tracy [name] is an 82 y.o. female with a PMH significant for HTN, DM, HLD, atrial fibrillation off anticoagulation due to GI bleed, DVT, and stroke, admitted to [facility] for evaluation of generalized weakness, weight loss due to refusal to eat and drink. While in the hospital patient noted to have mental state changes characterized by confusion, unable to recognize family members, at times agitated. Patient is no able to provide reliable clinical information, thus history is obtained from her son at the bedside who stated that patient was doing cognitively well at home, " but physically declining because she is not eating or drinking". Had had a couple of falls that he attributes to the fact that she is very weak all over. No reported personality or behavioral changes. Son also said that she does well with conversations, does not repeat herself, and her conversations were quite appropriate until she came to the hospital. Her son was in charge of administering patient medications at home. Said that she needs help to shower and dressed " just because she is so weak that can not do it herself". Work up in the hospital revealed unimpressive serologies, negative UA. MRI brain was severely compromised by motion but reported as showing no acute abnormality. Tracy [name] is refusing to participate on conversation with me, only talks to her son.    Past Medical History:  Diagnosis Date  . Atrial fibrillation ([place])   . CAROTID STENOSIS   . CVA (cerebrovascular accident) ([place]) 2002  . DIABETES MELLITUS, TYPE II   . Diverticulosis of colon   . DIVERTICULOSIS, COLON   . DM (diabetes mellitus), type 2 ([place])   . DVT (deep venous thrombosis)  ([place])   . GERD   . GERD (gastroesophageal reflux disease)   . HTN (hypertension)   . Hx of adenomatous colonic polyps 2007  . Hyperlipidemia   . HYPERLIPIDEMIA   . Hypertension   . Osteopenia   . OSTEOPENIA   . PVD (peripheral vascular disease) ([place])    recently dx w/ carotid -left  int carotid >90% 50-60% rt int carotid 02/2010  . PVD (peripheral vascular disease) ([facility])    lf  ABI 0.11 and rt 0.43  . Sinus node dysfunction ([place]) 2011  . Stroke [facility]) July 01, 2014   Affected Left side  . Upper GI bleed 02/2016    Past Surgical History:  Procedure Laterality Date  . CAROTID ENDARTERECTOMY    . cataract eye surgery    .  CNS vascular stent per Neurosurgeon    . ESOPHAGOGASTRODUODENOSCOPY (EGD) WITH PROPOFOL N/A 02/24/2016   Procedure: ESOPHAGOGASTRODUODENOSCOPY (EGD) WITH PROPOFOL;  Surgeon: Otis Brace, MD;  Location: [facility];  Service: Gastroenterology;  Laterality: N/A;  . EYE SURGERY    . ILIAC ARTERY STENT  2011   Bilateral CIA stent  . lazer eye surgery    . Right Shoulder Surgery  2010  . TUBAL LIGATION      Family History  Problem Relation Age of Onset  . Cancer Mother     colon  . Diabetes Sister     Amputation    Family History: unable to obtain due to mental status   Social History:  reports that she has quit smoking. She has never used smokeless tobacco. She reports that she does not drink alcohol or use drugs.  No Known Allergies  MEDICATIONS:                                                                                                                     I have reviewed the patient's current medications.   ROS: unable to obtain due to mental status                                                                                                                                      History obtained from chart review and son   Physical exam:  Constitutional: no apparent distress. Eyes: no jaundice or exophthalmos.  Head:  normocephalic. Neck: supple, no bruits, no JVD. Cardiac: no murmurs. Lungs: clear. Abdomen: soft, no tender, no mass. Extremities: no edema, clubbing, or cyanosis.  Skin: no rash  Blood pressure 99/81, pulse 87, temperature 97.5 F (36.4 C), temperature source Oral, resp. rate 18, weight 43.4 kg (95 lb 11.2 oz), SpO2 100 %.  Neurologic Examination:                                                                                                      General:  NAD Mental Status: Alert, disoriented to [place].  Speech fluent without evidence of aphasia. Patient is non cooperative with exam. Cranial Nerves: II:  Visual fields grossly normal, pupils equal, round, reactive to light and accommodation III,IV, VI: ptosis not present, extra-ocular motions intact bilaterally V,VII: smile symmetric, facial light touch sensation normal bilaterally VIII: hearing normal bilaterally IX,X: uvula rises symmetrically XI: bilateral shoulder shrug XII: midline tongue extension without atrophy or fasciculations Motor: Seems to move all limbs symmetrically and spontaneously Sensory:  No tested Deep Tendon Reflexes:  2 all over Plantars: Right: downgoing   Left: downgoing Cerebellar: Unable to test as patient does not participate on exam Gait:  No tested    Lab Results  Component Value Date/Time   CHOL 70 02/25/2016 02:59 AM    Results for orders placed or performed during the hospital encounter of 03/11/16 (from the past 48 hour(s))  Ammonia     Status: None   Collection Time: 03/17/16  2:55 PM  Result Value Ref Range   Ammonia 34 9 - 35 umol/L  Urinalysis, Routine w reflex microscopic     Status: Abnormal   Collection Time: 03/17/16  3:07 PM  Result Value Ref Range   Color, Urine YELLOW YELLOW   APPearance CLEAR CLEAR   Specific Gravity, Urine 1.006 1.005 - 1.030   pH 8.0 5.0 - 8.0   Glucose, UA NEGATIVE NEGATIVE mg/dL   Hgb urine dipstick NEGATIVE NEGATIVE   Bilirubin Urine  NEGATIVE NEGATIVE   Ketones, ur NEGATIVE NEGATIVE mg/dL   Protein, ur NEGATIVE NEGATIVE mg/dL   Nitrite NEGATIVE NEGATIVE   Leukocytes, UA SMALL (A) NEGATIVE   RBC / HPF 0-5 0 - 5 RBC/hpf   WBC, UA 6-30 0 - 5 WBC/hpf   Bacteria, UA RARE (A) NONE SEEN   Squamous Epithelial / LPF 0-5 (A) NONE SEEN   Hyaline Casts, UA PRESENT   CBC     Status: Abnormal   Collection Time: 03/18/16  4:15 AM  Result Value Ref Range   WBC 11.5 (H) 4.0 - 10.5 K/uL   RBC 3.44 (L) 3.87 - 5.11 MIL/uL   Hemoglobin 9.6 (L) 12.0 - 15.0 g/dL   HCT 30.7 (L) 36.0 - 46.0 %   MCV 89.2 78.0 - 100.0 fL   MCH 27.9 26.0 - 34.0 pg   MCHC 31.3 30.0 - 36.0 g/dL   RDW 16.0 (H) 11.5 - 15.5 %   Platelets 369 150 - 400 K/uL  Protime-INR     Status: Abnormal   Collection Time: 03/18/16  4:15 AM  Result Value Ref Range   Prothrombin Time 16.1 (H) 11.4 - 15.2 seconds   INR 1.29   Comprehensive metabolic panel     Status: Abnormal   Collection Time: 03/18/16  4:15 AM  Result Value Ref Range   Sodium 137 135 - 145 mmol/L   Potassium 3.8 3.5 - 5.1 mmol/L    Comment: DELTA CHECK NOTED   Chloride 105 101 - 111 mmol/L   CO2 26 22 - 32 mmol/L   Glucose, Bld 103 (H) 65 - 99 mg/dL   BUN <5 (L) 6 - 20 mg/dL   Creatinine, Ser 0.94 0.44 - 1.00 mg/dL   Calcium 7.7 (L) 8.9 - 10.3 mg/dL   Total Protein 5.2 (L) 6.5 - 8.1 g/dL   Albumin 2.3 (L) 3.5 - 5.0 g/dL   AST 29 15 - 41 U/L   ALT 24 14 - 54 U/L   Alkaline Phosphatase 42 38 - 126  U/L   Total Bilirubin 0.5 0.3 - 1.2 mg/dL   GFR calc non Af Amer 53 (L) >60 mL/min   GFR calc Af Amer >60 >60 mL/min    Comment: (NOTE) The eGFR has been calculated using the CKD EPI equation. This calculation has not been validated in all clinical situations. eGFR's persistently <60 mL/min signify possible Chronic Kidney Disease.    Anion gap 6 5 - 15  Glucose, capillary     Status: Abnormal   Collection Time: 03/18/16  7:43 AM  Result Value Ref Range   Glucose-Capillary 101 (H) 65 - 99 mg/dL   Magnesium     Status: Abnormal   Collection Time: 03/18/16  9:09 AM  Result Value Ref Range   Magnesium 1.5 (L) 1.7 - 2.4 mg/dL  Glucose, capillary     Status: None   Collection Time: 03/19/16  7:44 AM  Result Value Ref Range   Glucose-Capillary 88 65 - 99 mg/dL    Mr Brain Wo Contrast  Result Date: 03/17/2016 CLINICAL DATA:  Initial evaluation for acute confusion. EXAM: MRI HEAD WITHOUT CONTRAST TECHNIQUE: Multiplanar, multiecho pulse sequences of the brain and surrounding structures were obtained without intravenous contrast. COMPARISON:  Prior MRI from 02/07/2016. FINDINGS: Brain: Study markedly limited due to patient's inability to tolerate the full exam. Additionally, images provided are markedly degraded by motion artifact. Diffusion weighted imaging demonstrates no definite evidence for acute intracranial infarct. Cerebral atrophy with chronic microvascular ischemic disease is grossly stable. Similarly, remote bilateral occipital and left cerebellar infarcts also grossly stable. Remote lacunar infarcts again noted within the bilateral basal ganglia. No definite new acute intracranial process. Ventricles grossly stable in size without hydrocephalus. No definite mass lesion or midline shift. No extra-axial fluid collection. Vascular: Major intracranial vascular flow voids are grossly patent. Skull and upper cervical spine: Scalp soft tissues and calvarium grossly unremarkable. Craniocervical junction and skull base not well evaluated on this exam. Sinuses/Orbits: Orbital soft tissues and globes grossly unremarkable. Paranasal sinuses and mastoids are grossly clear. IMPRESSION: 1. Severely limited study due to extensive motion artifact and patient's inability to tolerate the full length of the exam. 2. No definite acute abnormality identified. If there remains high clinical suspicion for possible occult intracranial process, a repeat study when the patient is able to tolerate the exam could be  attempted. Electronically Signed   By: Jeannine Boga M.D.   On: 03/17/2016 21:56   Assessment/Plan: 82 y/o with multiple medical problems as delineated above, admitted with failure to thrive. She developed mental state changes while in the hospital that according to family is new onset. Has old cortical/subcorical infarcts on MRI, thus can not entirely exclude vascular cognitive impairment with superimposed delirium. Will be in favor of obtaining MRI (prior MRI severely compromised by motion) and EEG.  Dorian Pod, MD 03/19/2016, 10:18 AM

## 2016-03-20 ENCOUNTER — Inpatient Hospital Stay (HOSPITAL_COMMUNITY): Payer: Medicare Other

## 2016-03-20 ENCOUNTER — Encounter (HOSPITAL_COMMUNITY): Payer: Self-pay | Admitting: Certified Registered"

## 2016-03-20 ENCOUNTER — Encounter (HOSPITAL_COMMUNITY)
Admission: EM | Disposition: A | Discharge status: Skilled Nursing Facility | Payer: Self-pay | Source: Home / Self Care | Attending: Internal Medicine

## 2016-03-20 ENCOUNTER — Encounter (HOSPITAL_COMMUNITY): Payer: Self-pay | Admitting: Anesthesiology

## 2016-03-20 DIAGNOSIS — N179 Acute kidney failure, unspecified: Principal | ICD-10-CM

## 2016-03-20 HISTORY — PX: RADIOLOGY WITH ANESTHESIA: SHX6223

## 2016-03-20 LAB — GLUCOSE, CAPILLARY: Glucose-Capillary: 75 mg/dL (ref 65–99)

## 2016-03-20 SURGERY — RADIOLOGY WITH ANESTHESIA
Anesthesia: General

## 2016-03-20 NOTE — Procedures (Signed)
EEG report.  Brief clinical history: 82 years old female admitted to the hospital with failure to thrive and new onset altered mental status. No prior history of frank epileptic seizures.  Technique: this is a 17 channel routine scalp EEG performed at the bedside with bipolar and monopolar montages arranged in accordance to the international 10/20 system of electrode placement. One channel was dedicated to EKG recording.  No sleep was obtained during recording. No activating procedures performed.  Description: In the wakeful state, the best background consisted of a medium amplitude, posterior dominant, poorly sustained sustained, symmetric and reactive 8 Hz rhythm.  No focal or generalized epileptiform discharges noted.  No pathologic areas of slowing seen.  EKG showed sinus rhythm.  Impression: this is a normal awake EEG. Please, be aware that a normal EEG does not exclude the possibility of epilepsy.  Clinical correlation is advised.   Wyatt Portelasvaldo Camilo, MD

## 2016-03-20 NOTE — Anesthesia Preprocedure Evaluation (Signed)
Anesthesia Evaluation  Patient identified by MRN, date of birth, ID band Patient awake    Reviewed: Allergy & Precautions, NPO status , Patient's Chart, lab work & pertinent test results  Airway Mallampati: II   Neck ROM: full    Dental  (+) Edentulous Upper, Edentulous Lower   Pulmonary former smoker,    breath sounds clear to auscultation       Cardiovascular hypertension, + Peripheral Vascular Disease and +CHF   Rhythm:regular Rate:Normal     Neuro/Psych dementia  Neuromuscular disease CVA    GI/Hepatic PUD, GERD  ,  Endo/Other  diabetes, Type 2  Renal/GU ARFRenal disease     Musculoskeletal   Abdominal   Peds  Hematology  (+) anemia ,   Anesthesia Other Findings   Reproductive/Obstetrics                             Anesthesia Physical Anesthesia Plan  ASA: III  Anesthesia Plan: General   Post-op Pain Management:    Induction: Intravenous  Airway Management Planned: LMA  Additional Equipment:   Intra-op Plan:   Post-operative Plan:   Informed Consent: I have reviewed the patients History and Physical, chart, labs and discussed the procedure including the risks, benefits and alternatives for the proposed anesthesia with the patient or authorized representative who has indicated his/her understanding and acceptance.     Plan Discussed with: CRNA, Anesthesiologist and Surgeon  Anesthesia Plan Comments:         Anesthesia Quick Evaluation

## 2016-03-20 NOTE — Progress Notes (Signed)
NEURO HOSPITALIST PROGRESS NOTE   SUBJECTIVE:                                                                                                                        More conversant today. Son at the bedside concurs that she seems to be doing better today. He tells me that patient's memory has been declining " only recently". EEG normal. MRI brain showed no acute abnormality but old left cerebellar infarctions,old lacunar infarctions affecting the thalami basal ganglia old bilateral occipital cortical and subcortical infarctions. Extensive chronic small-vessel ischemicchanges throughout the cerebral hemispheric white matter.   OBJECTIVE:                                                                                                                           Vital signs in last 24 hours: Temp:  [97.2 F (36.2 C)-98.1 F (36.7 C)] 97.4 F (36.3 C) (12/16 1445) Pulse Rate:  [78-100] 100 (12/16 1445) Resp:  [15-29] 20 (12/16 1445) BP: (106-166)/(64-87) 140/79 (12/16 1445) SpO2:  [97 %-100 %] 100 % (12/16 1445) Weight:  [43.5 kg (96 lb)] 43.5 kg (96 lb) (12/16 0242)  Intake/Output from previous day: 12/15 0701 - 12/16 0700 In: 0  Out: 300 [Urine:300] Intake/Output this shift: Total I/O In: 0  Out: 750 [Urine:750] Nutritional status: Diet regular Room service appropriate? Yes; Fluid consistency: Thin  Past Medical History:  Diagnosis Date  . Atrial fibrillation ([facility])   . CAROTID STENOSIS   . CVA (cerebrovascular accident) ([facility]) 2002  . DIABETES MELLITUS, TYPE II   . Diverticulosis of colon   . DIVERTICULOSIS, COLON   . DM (diabetes mellitus), type 2 ([facility])   . DVT (deep venous thrombosis) ([facility])   . GERD   . GERD (gastroesophageal reflux disease)   . HTN (hypertension)   . Hx of adenomatous colonic polyps 2007  . Hyperlipidemia   . HYPERLIPIDEMIA   . Hypertension   . Osteopenia   . OSTEOPENIA   . PVD (peripheral vascular disease) ([facility])     recently dx w/ carotid -left  int carotid >90% 50-60% rt int carotid 02/2010  . PVD (peripheral vascular disease) ([facility])    lf  ABI 0.11 and rt 0.43  . Sinus  node dysfunction ([facility]) 2011  . Stroke [facility]) July 01, 2014   Affected Left side  . Upper GI bleed 02/2016    Neurologic ROS negative with exception of above. Physical exam:  Constitutional: no apparent distress. Eyes: no jaundice or exophthalmos.  Head: normocephalic. Neck: supple, no bruits, no JVD. Cardiac: no murmurs. Lungs: clear. Abdomen: soft, no tender, no mass. Extremities: no edema, clubbing, or cyanosis.  Skin: no rash   Neurologic Exam:  General: NAD Mental Status: Alert, disoriented to year-month.  Speech fluent without evidence of aphasia. Cranial Nerves: II:  Visual fields grossly normal, pupils equal, round, reactive to light and accommodation III,IV, VI: ptosis not present, extra-ocular motions intact bilaterally V,VII: smile symmetric, facial light touch sensation normal bilaterally VIII: hearing normal bilaterally IX,X: uvula rises symmetrically XI: bilateral shoulder shrug XII: midline tongue extension without atrophy or fasciculations Motor: Seems to move all limbs symmetrically and spontaneously Sensory:  No tested Deep Tendon Reflexes:  2 all over Plantars: Right: downgoing                                Left: downgoing Cerebellar: Unable to test as patient does not participate on exam Gait:  No tested    Lab Results: Lab Results  Component Value Date/Time   CHOL 70 02/25/2016 02:59 AM   Lipid Panel No results for input(s): CHOL, TRIG, HDL, CHOLHDL, VLDL, LDLCALC in the last 72 hours.  Studies/Results: Mr Brain Wo Contrast  Result Date: 03/20/2016 CLINICAL DATA:  Mental status changes. Not eating or drinking. Confusion. Falling. EXAM: MRI HEAD WITHOUT CONTRAST TECHNIQUE: Multiplanar, multiecho pulse sequences of the brain and surrounding structures were obtained without  intravenous contrast. COMPARISON:  03/17/2016.  02/07/2016. FINDINGS: Brain: Much better quality examination today. Diffusion imaging does not show any acute or subacute infarction. Mild chronic small-vessel ischemic change affects the pons. Old cerebellar infarctions on the left again demonstrated. Old bilateral occipital cortical and subcortical infarctions. Extensive chronic small-vessel ischemic changes throughout the cerebral hemispheric white matter. Old lacunar infarctions affecting the thalami basal ganglia. No mass lesion, hemorrhage, hydrocephalus or extra-axial collection. Vascular: Major vessels at the base of the brain show flow. Skull and upper cervical spine: Negative Sinuses/Orbits: Clear/normal Other: None significant IMPRESSION: No change. No acute or subacute finding. Extensive old ischemic changes throughout the brain as outlined above, unchanged from the recent exams. Electronically Signed   By: Paulina Fusi M.D.   On: 03/20/2016 11:12    MEDICATIONS                                                                                                                       I have reviewed the patient's current medications.  ASSESSMENT/PLAN:  82 y/o with multiple medical problems as delineated above, admitted with failure to thrive and mental state changes that allegedly is new onset while in the hospital. Multiple old cortical and subcortical lacunar infarcts on MRI, thus strongly suspect vascular dementia and favor a trial of donepezil. No further inpatient neurological work up at this moment.  Wyatt Portelasvaldo Camilo, MD [facility] [phone]  03/20/2016, 6:36 PM

## 2016-03-20 NOTE — Progress Notes (Signed)
PROGRESS NOTE  Tracy [name] WUX:[id number] DOB: 1926-12-27 DOA: 03/11/2016 PCP: Tracy BarreJames John, MD   LOS: 7 days   Brief Narrative: Tracy [name] a 82 y.o.femalewith medical history significant for dementia, history of CVA, chronic atrial fibrillation not anticoagulated due to GI bleeding, hypertension, and peripheral vascular disease who presents to the emergency department at the direction of her PCP for evaluation of generalized weakness with food refusal and lab abnormalities. Patient is accompanied by her son and daughter who assist with the history. Patient reportedly lives with her son who is at the bedside and had been in her usual state until approximately 4 days ago when she seemed to be generally weak and was refusing food and drink. She has continuedto exhibit increasing generalized weakness and continues to refuse any food or drink, and has not been taking her medications.   Assessment & Plan: Principal Problem:   Acute kidney injury (HCC) Active Problems:   Essential hypertension   GERD   Constipation   General weakness   Atrial fibrillation with RVR (HCC)   Hypercalcemia   Chronic atrial fibrillation (HCC)   Gastric ulcer   Hypokalemia   Loss of appetite   Leukocytosis   AKI (acute kidney injury) (HCC)   Dehydration   Acute kidney injury  - SCr was 2.21 on admission, up from 0.77 two weeks prior  - improving with IV fluids, cr has now normalized - encourage po intake  Generalized weakness, loss of appetite, early satiety, FTT - Patient was evaluated by PCP with normal TSH on 03/11/16 - urine culture no growth.  - CT abdomen pelvis with no acute abnormalities. GI evaluated patient , and they don't have further recommendations.  - B-12 1029 and Vitamin D 52.  - MRI brain with poor quality but essentially without acute finding. Repeat MRI with GA negative for acute findings. EEG negative  Acute encephalopathy - patient very confused 12/12 am, didn't  recognized daughter, agitated at times. - d/w daughter 12/13, this is new for patient, usually AxO and appropriate at home. There is mentioning of dementia however daughter vehemently denies - MRI brain negative, EEG negative   Constipation  - Dehydration and calcium supplements likely aggravating  - treated with with miralax BID, lactulose, dulcolax suppository .  - improved  Chronic atrial fibrillation  - CHADS-VASc is at least 205 (age x2, gender, hx CVA x2) - She is not anticoagulated d/t GI bleeds, recent admission 2 weeks ago.  - continue with Cardizem., PRN lopressor ordered   GERD, gastric ulcer  - EGD from November 2017 with esophageal ulcer and gastric ulcer - Continue with protonix.   Leukocytosis  - WBC is 13,000 on admission without fever or apparent source of infection  - CXR clear and UA not suggestive of infection; no upper resp sxs and no vomiting or diarrhea  - WBC fluctuates.  - stopped ceftriaxone.   Hypercalcemia; resolved.  - Serum calcium is 11.9 on admission in setting of marked dehydration and calcium supplements  - She was treated with fluid and calcium supplements will be held  - improved  Leukocytosis - CT abdomen no evidence of infection. Stop ceftriaxone.   Hypokalemia  - ongoing repletion today   DVT prophylaxis: SCD Code Status: Full Family Communication: d/w son bedside Disposition Plan: SNF 1 day if stable  Consultants:   None   Procedures:   None   Antimicrobials:  Ceftriaxone 12/11 x 1 dose   Subjective: - pleasantly confused   Objective:  Vitals:   03/20/16 1118 03/20/16 1121 03/20/16 1123 03/20/16 1445  BP:   124/85 140/79  Pulse: 85 83  100  Resp: 15 (!) 29 (!) 21 20  Temp:   97.2 F (36.2 C) 97.4 F (36.3 C)  TempSrc:    Oral  SpO2: 100% 100%  100%  Weight:        Intake/Output Summary (Last 24 hours) at 03/20/16 1459 Last data filed at 03/20/16 0800  Gross per 24 hour  Intake                0 ml    Output              300 ml  Net             -300 ml   Filed Weights   03/15/16 0500 03/17/16 0500 03/20/16 0242  Weight: 43 kg (94 lb 12.8 oz) 43.4 kg (95 lb 11.2 oz) 43.5 kg (96 lb)    Examination: Constitutional: NAD, confused Vitals:   03/20/16 1118 03/20/16 1121 03/20/16 1123 03/20/16 1445  BP:   124/85 140/79  Pulse: 85 83  100  Resp: 15 (!) 29 (!) 21 20  Temp:   97.2 F (36.2 C) 97.4 F (36.3 C)  TempSrc:    Oral  SpO2: 100% 100%  100%  Weight:       Eyes:  lids and conjunctivae normal Respiratory: clear to auscultation bilaterally, no wheezing, no crackles. Cardiovascular: Regular rate and rhythm, no murmurs / rubs / gallops.  Abdomen: no tenderness. Bowel sounds positive.  Musculoskeletal: no clubbing / cyanosis.  Neurologic: non focal, moves all 4  Data Reviewed: I have personally reviewed following labs and imaging studies  CBC:  Recent Labs Lab 03/14/16 0440 03/15/16 0648 03/16/16 0604 03/17/16 0223 03/18/16 0415  WBC 14.0* 14.0* 11.0* 10.2 11.5*  HGB 10.5* 10.3* 10.9* 9.7* 9.6*  HCT 33.5* 32.9* 34.3* 30.6* 30.7*  MCV 90.5 89.6 88.9 89.0 89.2  PLT 453* 372 386 367 369   Basic Metabolic Panel:  Recent Labs Lab 03/14/16 0440 03/15/16 0648 03/16/16 0604 03/17/16 0223 03/18/16 0415 03/18/16 0909  NA 135 137 138 138 137  --   K 4.2 3.2* 3.8 3.0* 3.8  --   CL 101 102 102 104 105  --   CO2 24 23 25 27 26   --   GLUCOSE 84 111* 85 112* 103*  --   BUN 19 14 8 7  <5*  --   CREATININE 1.47* 1.21* 1.02* 1.00 0.94  --   CALCIUM 9.2 8.4* 8.5* 7.9* 7.7*  --   MG  --   --   --   --   --  1.5*   GFR: Estimated Creatinine Clearance: 29.3 mL/min (by C-G formula based on SCr of 0.94 mg/dL). Liver Function Tests:  Recent Labs Lab 03/18/16 0415  AST 29  ALT 24  ALKPHOS 42  BILITOT 0.5  PROT 5.2*  ALBUMIN 2.3*   No results for input(s): LIPASE, AMYLASE in the last 168 hours.  Recent Labs Lab 03/17/16 1455  AMMONIA 34   Coagulation  Profile:  Recent Labs Lab 03/18/16 0415  INR 1.29   CBG:  Recent Labs Lab 03/16/16 0754 03/17/16 0825 03/18/16 0743 03/19/16 0744 03/20/16 0751  GLUCAP 80 107* 101* 88 75   Lipid Profile: No results for input(s): CHOL, HDL, LDLCALC, TRIG, CHOLHDL, LDLDIRECT in the last 72 hours. Thyroid Function Tests: No results for input(s): TSH, T4TOTAL, FREET4, T3FREE, THYROIDAB  in the last 72 hours. Anemia Panel: No results for input(s): VITAMINB12, FOLATE, FERRITIN, TIBC, IRON, RETICCTPCT in the last 72 hours. Urine analysis:    Component Value Date/Time   COLORURINE YELLOW 03/17/2016 1507   APPEARANCEUR CLEAR 03/17/2016 1507   LABSPEC 1.006 03/17/2016 1507   PHURINE 8.0 03/17/2016 1507   GLUCOSEU NEGATIVE 03/17/2016 1507   GLUCOSEU NEGATIVE 02/13/2016 1201   HGBUR NEGATIVE 03/17/2016 1507   BILIRUBINUR NEGATIVE 03/17/2016 1507   KETONESUR NEGATIVE 03/17/2016 1507   PROTEINUR NEGATIVE 03/17/2016 1507   UROBILINOGEN 0.2 02/13/2016 1201   NITRITE NEGATIVE 03/17/2016 1507   LEUKOCYTESUR SMALL (A) 03/17/2016 1507   Sepsis Labs: Invalid input(s): PROCALCITONIN, LACTICIDVEN  Recent Results (from the past 240 hour(s))  Urine culture     Status: Abnormal   Collection Time: 03/11/16  6:55 PM  Result Value Ref Range Status   Specimen Description URINE, RANDOM  Final   Special Requests NONE  Final   Culture (A)  Final    20,000 COLONIES/mL LACTOBACILLUS SPECIES Standardized susceptibility testing for this organism is not available.    Report Status 03/13/2016 FINAL  Final    Radiology Studies: Mr Brain Wo Contrast  Result Date: 03/20/2016 CLINICAL DATA:  Mental status changes. Not eating or drinking. Confusion. Falling. EXAM: MRI HEAD WITHOUT CONTRAST TECHNIQUE: Multiplanar, multiecho pulse sequences of the brain and surrounding structures were obtained without intravenous contrast. COMPARISON:  03/17/2016.  02/07/2016. FINDINGS: Brain: Much better quality examination today.  Diffusion imaging does not show any acute or subacute infarction. Mild chronic small-vessel ischemic change affects the pons. Old cerebellar infarctions on the left again demonstrated. Old bilateral occipital cortical and subcortical infarctions. Extensive chronic small-vessel ischemic changes throughout the cerebral hemispheric white matter. Old lacunar infarctions affecting the thalami basal ganglia. No mass lesion, hemorrhage, hydrocephalus or extra-axial collection. Vascular: Major vessels at the base of the brain show flow. Skull and upper cervical spine: Negative Sinuses/Orbits: Clear/normal Other: None significant IMPRESSION: No change. No acute or subacute finding. Extensive old ischemic changes throughout the brain as outlined above, unchanged from the recent exams. Electronically Signed   By: Paulina Fusi M.D.   On: 03/20/2016 11:12   Scheduled Meds: . aspirin  325 mg Oral Daily  . atorvastatin  5 mg Oral q1800  . diltiazem  180 mg Oral Daily  . feeding supplement (ENSURE ENLIVE)  237 mL Oral BID BM  . metoprolol tartrate  12.5 mg Oral BID  . multivitamin  1 tablet Oral BID  . nystatin  5 mL Oral QID  . oxybutynin  2.5 mg Oral BID  . pantoprazole sodium  40 mg Oral BID  . pentoxifylline  400 mg Oral q morning - 10a  . polyethylene glycol  17 g Oral Daily  . sodium chloride flush  3 mL Intravenous Q12H   Continuous Infusions:  Pamella Pert, MD, PhD [facility] Pager [phone]  If 7PM-7AM, please contact night-coverage [email or web address] Password [email or web address] 03/20/2016, 2:59 PM

## 2016-03-21 MED ORDER — DONEPEZIL HCL 5 MG PO TABS
5.0000 mg | ORAL_TABLET | Freq: Every day | ORAL | Status: DC
  Administered 2016-03-21: 5 mg via ORAL
  Filled 2016-03-21: qty 1

## 2016-03-21 MED ORDER — OXYBUTYNIN CHLORIDE 5 MG PO TABS
2.5000 mg | ORAL_TABLET | Freq: Two times a day (BID) | ORAL | Status: DC
  Administered 2016-03-21 – 2016-03-22 (×3): 2.5 mg via ORAL
  Filled 2016-03-21 (×3): qty 1

## 2016-03-21 NOTE — Progress Notes (Signed)
PROGRESS NOTE  Tracy [name] ZOX:[id number] DOB: 03/07/1927 DOA: 03/11/2016 PCP: Oliver BarreJames John, MD   LOS: 8 days   Brief Narrative: Severiano Gilbertphelia W [name] a 82 y.o.femalewith medical history significant for dementia, history of CVA, chronic atrial fibrillation not anticoagulated due to GI bleeding, hypertension, and peripheral vascular disease who presents to the emergency department at the direction of her PCP for evaluation of generalized weakness with food refusal and lab abnormalities. Patient is accompanied by her son and daughter who assist with the history. Patient reportedly lives with her son who is at the bedside and had been in her usual state until approximately 4 days ago when she seemed to be generally weak and was refusing food and drink. She has continuedto exhibit increasing generalized weakness and continues to refuse any food or drink, and has not been taking her medications.   Assessment & Plan: Principal Problem:   Acute kidney injury [facility](HCC) Active Problems:   Essential hypertension   GERD   Constipation   General weakness   Atrial fibrillation with RVR (HCC)   Hypercalcemia   Chronic atrial fibrillation (HCC)   Gastric ulcer   Hypokalemia   Loss of appetite   Leukocytosis   AKI (acute kidney injury) (HCC)   Dehydration   Discussed with social worker, facility will accept patient on Monday, cannot transfer patient to SNF today   Acute kidney injury  - SCr was 2.21 on admission, up from 0.77 two weeks prior  - improving with IV fluids, cr has now normalized - encourage po intake  Generalized weakness, loss of appetite, early satiety, FTT - Patient was evaluated by PCP with normal TSH on 03/11/16 - urine culture no growth.  - CT abdomen pelvis with no acute abnormalities. GI evaluated patient , and they don't have further recommendations.  - B-12 1029 and Vitamin D 52.  - MRI brain with poor quality but essentially without acute finding. Repeat MRI with GA  negative for acute findings. EEG negative  Acute encephalopathy - patient very confused 12/12 am, didn't recognized daughter, agitated at times. - d/w daughter 12/13, this is new for patient, usually AxO and appropriate at home. There is mentioning of dementia however daughter vehemently denies - MRI brain negative, EEG negative   Constipation  - Dehydration and calcium supplements likely aggravating  - treated with with miralax BID, lactulose, dulcolax suppository .  - improved  Chronic atrial fibrillation  - CHADS-VASc is at least 165 (age x2, gender, hx CVA x2) - She is not anticoagulated d/t GI bleeds, recent admission 2 weeks ago.  - continue with Cardizem., PRN lopressor ordered   GERD, gastric ulcer  - EGD from November 2017 with esophageal ulcer and gastric ulcer - Continue with protonix.   Leukocytosis  - WBC is 13,000 on admission without fever or apparent source of infection  - CXR clear and UA not suggestive of infection; no upper resp sxs and no vomiting or diarrhea  - WBC fluctuates.  - stopped ceftriaxone.   Hypercalcemia; resolved.  - Serum calcium is 11.9 on admission in setting of marked dehydration and calcium supplements  - She was treated with fluid and calcium supplements will be held  - improved  Leukocytosis - CT abdomen no evidence of infection. Stop ceftriaxone.   Hypokalemia  - ongoing repletion today   DVT prophylaxis: SCD Code Status: Full Family Communication: d/w son over the phone Disposition Plan: SNF 1 day if stable  Consultants:   None   Procedures:  None   Antimicrobials:  Ceftriaxone 12/11 x 1 dose   Subjective: - pleasantly confused   Objective: Vitals:   03/20/16 2154 03/21/16 0201 03/21/16 0502 03/21/16 0600  BP: 138/72  (!) 154/98   Pulse: 86  (!) 105   Resp: 19  19   Temp: 97.1 F (36.2 C)  97.4 F (36.3 C)   TempSrc: Oral     SpO2: 99%  99%   Weight:  44.5 kg (98 lb)    Height:    4\' 11"  (1.499 m)     Intake/Output Summary (Last 24 hours) at 03/21/16 1105 Last data filed at 03/21/16 1104  Gross per 24 hour  Intake                0 ml  Output              500 ml  Net             -500 ml   Filed Weights   03/17/16 0500 03/20/16 0242 03/21/16 0201  Weight: 43.4 kg (95 lb 11.2 oz) 43.5 kg (96 lb) 44.5 kg (98 lb)    Examination: Constitutional: NAD, confused Vitals:   03/20/16 2154 03/21/16 0201 03/21/16 0502 03/21/16 0600  BP: 138/72  (!) 154/98   Pulse: 86  (!) 105   Resp: 19  19   Temp: 97.1 F (36.2 C)  97.4 F (36.3 C)   TempSrc: Oral     SpO2: 99%  99%   Weight:  44.5 kg (98 lb)    Height:    4\' 11"  (1.499 m)   Eyes:  lids and conjunctivae normal Respiratory: clear to auscultation bilaterally, no wheezing, no crackles. Cardiovascular: Regular rate and rhythm, no murmurs / rubs / gallops.  Abdomen: no tenderness. Bowel sounds positive.  Musculoskeletal: no clubbing / cyanosis.  Neurologic: non focal, moves all 4  Data Reviewed: I have personally reviewed following labs and imaging studies  CBC:  Recent Labs Lab 03/15/16 0648 03/16/16 0604 03/17/16 0223 03/18/16 0415  WBC 14.0* 11.0* 10.2 11.5*  HGB 10.3* 10.9* 9.7* 9.6*  HCT 32.9* 34.3* 30.6* 30.7*  MCV 89.6 88.9 89.0 89.2  PLT 372 386 367 369   Basic Metabolic Panel:  Recent Labs Lab 03/15/16 0648 03/16/16 0604 03/17/16 0223 03/18/16 0415 03/18/16 0909  NA 137 138 138 137  --   K 3.2* 3.8 3.0* 3.8  --   CL 102 102 104 105  --   CO2 23 25 27 26   --   GLUCOSE 111* 85 112* 103*  --   BUN 14 8 7  <5*  --   CREATININE 1.21* 1.02* 1.00 0.94  --   CALCIUM 8.4* 8.5* 7.9* 7.7*  --   MG  --   --   --   --  1.5*   GFR: Estimated Creatinine Clearance: 29.3 mL/min (by C-G formula based on SCr of 0.94 mg/dL). Liver Function Tests:  Recent Labs Lab 03/18/16 0415  AST 29  ALT 24  ALKPHOS 42  BILITOT 0.5  PROT 5.2*  ALBUMIN 2.3*   No results for input(s): LIPASE, AMYLASE in the last 168  hours.  Recent Labs Lab 03/17/16 1455  AMMONIA 34   Coagulation Profile:  Recent Labs Lab 03/18/16 0415  INR 1.29   CBG:  Recent Labs Lab 03/16/16 0754 03/17/16 0825 03/18/16 0743 03/19/16 0744 03/20/16 0751  GLUCAP 80 107* 101* 88 75   Lipid Profile: No results for input(s): CHOL, HDL, LDLCALC,  TRIG, CHOLHDL, LDLDIRECT in the last 72 hours. Thyroid Function Tests: No results for input(s): TSH, T4TOTAL, FREET4, T3FREE, THYROIDAB in the last 72 hours. Anemia Panel: No results for input(s): VITAMINB12, FOLATE, FERRITIN, TIBC, IRON, RETICCTPCT in the last 72 hours. Urine analysis:    Component Value Date/Time   COLORURINE YELLOW 03/17/2016 1507   APPEARANCEUR CLEAR 03/17/2016 1507   LABSPEC 1.006 03/17/2016 1507   PHURINE 8.0 03/17/2016 1507   GLUCOSEU NEGATIVE 03/17/2016 1507   GLUCOSEU NEGATIVE 02/13/2016 1201   HGBUR NEGATIVE 03/17/2016 1507   BILIRUBINUR NEGATIVE 03/17/2016 1507   KETONESUR NEGATIVE 03/17/2016 1507   PROTEINUR NEGATIVE 03/17/2016 1507   UROBILINOGEN 0.2 02/13/2016 1201   NITRITE NEGATIVE 03/17/2016 1507   LEUKOCYTESUR SMALL (A) 03/17/2016 1507   Sepsis Labs: Invalid input(s): PROCALCITONIN, LACTICIDVEN  Recent Results (from the past 240 hour(s))  Urine culture     Status: Abnormal   Collection Time: 03/11/16  6:55 PM  Result Value Ref Range Status   Specimen Description URINE, RANDOM  Final   Special Requests NONE  Final   Culture (A)  Final    20,000 COLONIES/mL LACTOBACILLUS SPECIES Standardized susceptibility testing for this organism is not available.    Report Status 03/13/2016 FINAL  Final    Radiology Studies: Mr Brain Wo Contrast  Result Date: 03/20/2016 CLINICAL DATA:  Mental status changes. Not eating or drinking. Confusion. Falling. EXAM: MRI HEAD WITHOUT CONTRAST TECHNIQUE: Multiplanar, multiecho pulse sequences of the brain and surrounding structures were obtained without intravenous contrast. COMPARISON:  03/17/2016.   02/07/2016. FINDINGS: Brain: Much better quality examination today. Diffusion imaging does not show any acute or subacute infarction. Mild chronic small-vessel ischemic change affects the pons. Old cerebellar infarctions on the left again demonstrated. Old bilateral occipital cortical and subcortical infarctions. Extensive chronic small-vessel ischemic changes throughout the cerebral hemispheric white matter. Old lacunar infarctions affecting the thalami basal ganglia. No mass lesion, hemorrhage, hydrocephalus or extra-axial collection. Vascular: Major vessels at the base of the brain show flow. Skull and upper cervical spine: Negative Sinuses/Orbits: Clear/normal Other: None significant IMPRESSION: No change. No acute or subacute finding. Extensive old ischemic changes throughout the brain as outlined above, unchanged from the recent exams. Electronically Signed   By: Paulina FusiMark  Shogry M.D.   On: 03/20/2016 11:12   Scheduled Meds: . aspirin  325 mg Oral Daily  . atorvastatin  5 mg Oral q1800  . diltiazem  180 mg Oral Daily  . donepezil  5 mg Oral QHS  . feeding supplement (ENSURE ENLIVE)  237 mL Oral BID BM  . metoprolol tartrate  12.5 mg Oral BID  . multivitamin  1 tablet Oral BID  . nystatin  5 mL Oral QID  . oxybutynin  2.5 mg Oral BID  . pantoprazole sodium  40 mg Oral BID  . pentoxifylline  400 mg Oral q morning - 10a  . polyethylene glycol  17 g Oral Daily  . sodium chloride flush  3 mL Intravenous Q12H   Continuous Infusions:  Pamella Pertostin Alayah Knouff, MD, PhD [facility] Pager [phone]  If 7PM-7AM, please contact night-coverage [email or web address] Password [email or web address] 03/21/2016, 11:05 AM

## 2016-03-21 NOTE — Progress Notes (Signed)
Verbal order from Dr. Elvera LennoxGherghe to remove foley at this time

## 2016-03-22 ENCOUNTER — Encounter (HOSPITAL_COMMUNITY): Payer: Self-pay | Admitting: Radiology

## 2016-03-22 DIAGNOSIS — E876 Hypokalemia: Secondary | ICD-10-CM | POA: Diagnosis not present

## 2016-03-22 DIAGNOSIS — E860 Dehydration: Secondary | ICD-10-CM | POA: Diagnosis not present

## 2016-03-22 DIAGNOSIS — G8929 Other chronic pain: Secondary | ICD-10-CM | POA: Diagnosis not present

## 2016-03-22 DIAGNOSIS — M25511 Pain in right shoulder: Secondary | ICD-10-CM | POA: Diagnosis not present

## 2016-03-22 DIAGNOSIS — M12811 Other specific arthropathies, not elsewhere classified, right shoulder: Secondary | ICD-10-CM | POA: Diagnosis not present

## 2016-03-22 DIAGNOSIS — I10 Essential (primary) hypertension: Secondary | ICD-10-CM | POA: Diagnosis not present

## 2016-03-22 DIAGNOSIS — R279 Unspecified lack of coordination: Secondary | ICD-10-CM | POA: Diagnosis not present

## 2016-03-22 DIAGNOSIS — R5381 Other malaise: Secondary | ICD-10-CM | POA: Diagnosis not present

## 2016-03-22 DIAGNOSIS — M25512 Pain in left shoulder: Secondary | ICD-10-CM | POA: Diagnosis not present

## 2016-03-22 DIAGNOSIS — I82409 Acute embolism and thrombosis of unspecified deep veins of unspecified lower extremity: Secondary | ICD-10-CM | POA: Diagnosis not present

## 2016-03-22 DIAGNOSIS — R531 Weakness: Secondary | ICD-10-CM | POA: Diagnosis not present

## 2016-03-22 DIAGNOSIS — R2681 Unsteadiness on feet: Secondary | ICD-10-CM | POA: Diagnosis not present

## 2016-03-22 DIAGNOSIS — R1310 Dysphagia, unspecified: Secondary | ICD-10-CM | POA: Diagnosis not present

## 2016-03-22 DIAGNOSIS — N179 Acute kidney failure, unspecified: Secondary | ICD-10-CM | POA: Diagnosis not present

## 2016-03-22 DIAGNOSIS — M12812 Other specific arthropathies, not elsewhere classified, left shoulder: Secondary | ICD-10-CM | POA: Diagnosis not present

## 2016-03-22 DIAGNOSIS — I4891 Unspecified atrial fibrillation: Secondary | ICD-10-CM | POA: Diagnosis not present

## 2016-03-22 DIAGNOSIS — D72829 Elevated white blood cell count, unspecified: Secondary | ICD-10-CM | POA: Diagnosis not present

## 2016-03-22 DIAGNOSIS — G9340 Encephalopathy, unspecified: Secondary | ICD-10-CM | POA: Diagnosis not present

## 2016-03-22 DIAGNOSIS — R609 Edema, unspecified: Secondary | ICD-10-CM | POA: Diagnosis not present

## 2016-03-22 LAB — GLUCOSE, CAPILLARY: GLUCOSE-CAPILLARY: 75 mg/dL (ref 65–99)

## 2016-03-22 MED ORDER — DONEPEZIL HCL 5 MG PO TABS: 5.0000 mg | ORAL_TABLET | Freq: Every day | ORAL | Status: DC

## 2016-03-22 MED ORDER — METOPROLOL TARTRATE 25 MG PO TABS: 12.5000 mg | ORAL_TABLET | Freq: Two times a day (BID) | ORAL | Status: DC

## 2016-03-22 MED FILL — Propofol IV Emul 200 MG/20ML (10 MG/ML): INTRAVENOUS | Qty: 20 | Status: AC

## 2016-03-22 MED FILL — Phenylephrine-Dextrose IV Solution 8 MG/100ML-5%: INTRAVENOUS | Qty: 100 | Status: AC

## 2016-03-22 MED FILL — Lidocaine HCl IV Inj 20 MG/ML: INTRAVENOUS | Qty: 5 | Status: AC

## 2016-03-22 NOTE — Care Management Important Message (Signed)
Important Message  Patient Details  Name: Tracy [name] MRN: [id number] Date of Birth: [birth year]   Medicare Important Message Given:  Yes    [name] Abena 03/22/2016, 11:00 AM

## 2016-03-22 NOTE — Discharge Summary (Signed)
Physician Discharge Summary  Tracy [name] ZOX:[id number] DOB: [birth year] DOA: 03/11/2016  PCP: Tracy BarreJames John, MD  Admit date: 03/11/2016 Discharge date: 03/22/2016  Admitted From: home Disposition:  SNF  Recommendations for Outpatient Follow-up:  1. Follow up with PCP in 1-2 weeks 2. Continue Foley catheter at discharge, when more ambulatory do voiding trial in 3-4 days 3. Check BMP in 3 days  Discharge Condition: stable CODE STATUS: Full Diet recommendation: regular  HPI: per Dr. Antionette [name], Tracy [name] is a 82 y.o. female with medical history significant for dementia, history of CVA, chronic atrial fibrillation not anticoagulated due to GI bleeding, hypertension, and peripheral vascular disease who presents to the emergency department at the direction of her PCP for evaluation of generalized weakness with food refusal and lab abnormalities. Patient is accompanied by her son and daughter who assist with the history. Patient reportedly lives with her son who is at the bedside and had been in her usual state until approximately 4 days ago when she seemed to be generally weak and was refusing food and drink. She has continued to exhibit increasing generalized weakness and continues to refuse any food or drink, and has not been taking her medications. She has not voiced any complaints, has not been noted to have much of a cough, and there has been no diarrhea or vomiting. There has been no recent fall or trauma, no long distance travel, and no sick contacts. Patient was taken to her PCP earlier today for evaluation of these complaints and basic blood work revealed an acute kidney injury and critical hypokalemia. She was directed to the emergency department for further evaluation of this. ED Course: Upon arrival to the ED, patient is found to be afebrile, saturating well on room air, tachycardic in the low 100s, and with stable blood pressure and respirations. EKG features in atrial fibrillation  with rate 111 and left axis deviation. Chest x-ray is negative for acute cardiopulmonary disease. Chemistry panel features a potassium of 2.8, BUN of 40, and serum creatinine of 2.21, up from 0.77 just two weeks prior. Chemistries also revealed a serum albumin of 2.9 and calcium of 11.9. CBC is notable for a leukocytosis to 13,000, thrombocytosis to 547,000, and a stable normocytic anemia with hemoglobin of 11.3. Lactic acid is reassuring at 1.1, troponin is within the normal limits, and urinalysis is unremarkable. Patient was given 500 mL of normal saline in the emergency department and 30 mEq of IV potassium. She has remained hemodynamically stable in the ED, tachycardia resolved with fluid, and she has not been in any respiratory distress. She will be observed on the telemetry unit for ongoing evaluation and management of constipation with food refusal and subsequent dehydration with AKI and hypokalemia.   Hospital Course: Discharge Diagnoses:  Principal Problem:   Acute kidney injury [facility](HCC) Active Problems:   Essential hypertension   GERD   Constipation   General weakness   Atrial fibrillation with RVR (HCC)   Hypercalcemia   Chronic atrial fibrillation (HCC)   Gastric ulcer   Hypokalemia   Loss of appetite   Leukocytosis   AKI (acute kidney injury) (HCC)   Dehydration  Acute kidney injury - SCr was 2.21 on admission, up from 0.77 two weeks prior, improved with IV fluids, cr has now normalized and has been stable off of fluids. Encourage po intake Generalized weakness, loss of appetite, early satiety, FTT due to dementia - Patient was evaluated by PCP with normal TSH on 03/11/16, urine culture  no growth. CT abdomen pelvis with no acute abnormalities. GI evaluated patient , and they don't have further recommendations. B-12 1029 and Vitamin D 52. MRI brain with poor quality but essentially without acute finding. Repeat MRI with general anesthesia negative for acute findings. EEG negative.  Neurology was consulted and have evaluated patient, likely had dementia, she was started on Aricept Acute encephalopathy - negative workup as above, likely underlying dementia with temporary delirium in the hospital which is now resolved.  Constipation - Dehydration and calcium supplements likely aggravating, improved Chronic atrial fibrillation - CHADS-VASc is at least 39 (age x2, gender, hx CVA x2), She is not anticoagulated d/t GI bleeds,recent admission 2 weeks ago, continue with Cardizem., started also on low dose metoprolol, rate controlled currently GERD, gastric ulcer  - EGD from November 2017 with esophageal ulcer and gastric ulcer, Continue with protonix.  Leukocytosis - WBC is 13,000 on admission without fever or apparent source of infection, resolved. CXR clear and UA not suggestive of infection; no upper resp sxs and no vomiting or diarrhea  Hypercalcemia; resolved. - Serum calcium is 11.9 on admission in setting of marked dehydration and calcium supplements  Urinary retention - likely due to immobility, d/c foley once more ambulatory, attempt voiding trial in 4 days at [facility].   Discharge Instructions   Allergies as of 03/22/2016   No Known Allergies     Medication List    STOP taking these medications   CALTRATE 600 1500 (600 Ca) MG Tabs tablet Generic drug:  calcium carbonate   gabapentin 100 MG capsule Commonly known as:  NEURONTIN   HYDROcodone-acetaminophen 5-325 MG tablet Commonly known as:  NORCO/VICODIN     TAKE these medications   aspirin 325 MG tablet Take 325 mg by mouth daily.   atorvastatin 10 MG tablet Commonly known as:  LIPITOR 1/2 tab by mouth daily What changed:  how much to take  how to take this  when to take this  additional instructions   diltiazem 180 MG 24 hr capsule Commonly known as:  CARDIZEM CD Take 1 capsule (180 mg total) by mouth daily.   donepezil 5 MG tablet Commonly known as:  ARICEPT Take 1 tablet (5 mg total) by mouth  at bedtime.   feeding supplement (ENSURE ENLIVE) Liqd Take 237 mLs by mouth 2 (two) times daily between meals.   fenofibrate micronized 134 MG capsule Commonly known as:  LOFIBRA TAKE 1 CAPSULE (134 MG TOTAL) BY MOUTH DAILY.   metoprolol tartrate 25 MG tablet Commonly known as:  LOPRESSOR Take 0.5 tablets (12.5 mg total) by mouth 2 (two) times daily.   multivitamin capsule Take 1 capsule by mouth daily. Centrum Silver   oxybutynin 5 MG 24 hr tablet Commonly known as:  DITROPAN-XL TAKE 1 TABLET BY MOUTH EVERY DAY   pantoprazole 40 MG tablet Commonly known as:  PROTONIX Take 1 tablet (40 mg total) by mouth 2 (two) times daily before a meal.   pentoxifylline 400 MG CR tablet Commonly known as:  TRENTAL TAKE 1 TABLET (400 MG TOTAL) BY MOUTH DAILY.   PRESERVISION AREDS 2 Caps Take 1 capsule by mouth 2 (two) times daily.       Contact information for follow-up providers    [facility] Follow up.   Why:  Home health PT arranged Contact information: [address] [place] [address] [phone]            Contact information for after-discharge care    Destination  [facility] Follow up.   Specialty:  Skilled Nursing Facility Contact information: [address] [place] [address] [phone]                 No Known Allergies  Consultations:  Neurology   Procedures/Studies:  Ct Abdomen Pelvis Wo Contrast  Result Date: 03/15/2016 CLINICAL DATA:  Abdominal pain.  History of diverticulosis. EXAM: CT ABDOMEN AND PELVIS WITHOUT CONTRAST TECHNIQUE: Multidetector CT imaging of the abdomen and pelvis was performed following the standard protocol without IV contrast. COMPARISON:  None. FINDINGS: Lower chest: Partially calcified pleural plaques are again demonstrated. Hepatobiliary: No focal hepatic lesions noncontrast exam. Under gallstones noted within a nondistended gallbladder.  Pancreas: Pancreatic head is poorly evaluated on this noncontrast exam. Body and tail appear normal. No duct dilatation. Spleen: Normal spleen Adrenals/urinary tract: Adrenal glands and kidneys are normal. The ureters and bladder normal. Stomach/Bowel: Small hiatal hernia. Duodenum small bowel are normal. RIGHT colon is normal. Several scattered diverticular of the LEFT colon. Extensive diverticulosis of the sigmoid colon. No acute inflammation Vascular/Lymphatic: Iliac stents in the iliac arteries. Extensive vascular calcification Reproductive: Uterus and ovaries normal Other: No free fluid. Musculoskeletal: No aggressive osseous lesion. IMPRESSION: 1. No acute findings the abdomen pelvis. 2. Extensive sigmoid diverticulosis without evidence of diverticulitis. 3.  Atherosclerotic calcification of the aorta.  Iliac stents noted 4. Cholelithiasis without cholecystitis. 5. Pancreas head poorly evaluated on noncontrast exam. 6. Partially calcified pleural plaques. Electronically Signed   By: Genevive Bi M.D.   On: 03/15/2016 17:10   Dg Chest 2 View  Result Date: 03/11/2016 CLINICAL DATA:  82 year old female with increased weakness and decreased p.o. intake. Initial encounter. EXAM: CHEST  2 VIEW COMPARISON:  Chest radiographs 02/04/2016 and earlier. FINDINGS: Stable cardiomegaly and mediastinal contours. Shallower lung volumes on the lateral view today. Chronic calcified pleural plaques. Calcified aortic atherosclerosis. No pneumothorax or pulmonary edema. No pleural effusion suspected today. No definite acute pulmonary opacity. Stable visualized osseous structures. , including benign chondroid appearing sclerotic matrix in the proximal right humerus. IMPRESSION: 1. Stable cardiomegaly. No acute cardiopulmonary abnormality. Calcified pleural plaques. 2.  Calcified aortic atherosclerosis. Electronically Signed   By: Odessa Fleming M.D.   On: 03/11/2016 18:38   Mr Brain Wo Contrast  Result Date:  03/20/2016 CLINICAL DATA:  Mental status changes. Not eating or drinking. Confusion. Falling. EXAM: MRI HEAD WITHOUT CONTRAST TECHNIQUE: Multiplanar, multiecho pulse sequences of the brain and surrounding structures were obtained without intravenous contrast. COMPARISON:  03/17/2016.  02/07/2016. FINDINGS: Brain: Much better quality examination today. Diffusion imaging does not show any acute or subacute infarction. Mild chronic small-vessel ischemic change affects the pons. Old cerebellar infarctions on the left again demonstrated. Old bilateral occipital cortical and subcortical infarctions. Extensive chronic small-vessel ischemic changes throughout the cerebral hemispheric white matter. Old lacunar infarctions affecting the thalami basal ganglia. No mass lesion, hemorrhage, hydrocephalus or extra-axial collection. Vascular: Major vessels at the base of the brain show flow. Skull and upper cervical spine: Negative Sinuses/Orbits: Clear/normal Other: None significant IMPRESSION: No change. No acute or subacute finding. Extensive old ischemic changes throughout the brain as outlined above, unchanged from the recent exams. Electronically Signed   By: Paulina Fusi M.D.   On: 03/20/2016 11:12   Mr Brain Wo Contrast  Result Date: 03/17/2016 CLINICAL DATA:  Initial evaluation for acute confusion. EXAM: MRI HEAD WITHOUT CONTRAST TECHNIQUE: Multiplanar, multiecho pulse sequences of the brain and surrounding structures were obtained without intravenous contrast. COMPARISON:  Prior MRI from 02/07/2016. FINDINGS: Brain: Study markedly limited due to patient's inability to tolerate the full exam. Additionally, images provided are markedly degraded by motion artifact. Diffusion weighted imaging demonstrates no definite evidence for acute intracranial infarct. Cerebral atrophy with chronic microvascular ischemic disease is grossly stable. Similarly, remote bilateral occipital and left cerebellar infarcts also grossly  stable. Remote lacunar infarcts again noted within the bilateral basal ganglia. No definite new acute intracranial process. Ventricles grossly stable in size without hydrocephalus. No definite mass lesion or midline shift. No extra-axial fluid collection. Vascular: Major intracranial vascular flow voids are grossly patent. Skull and upper cervical spine: Scalp soft tissues and calvarium grossly unremarkable. Craniocervical junction and skull base not well evaluated on this exam. Sinuses/Orbits: Orbital soft tissues and globes grossly unremarkable. Paranasal sinuses and mastoids are grossly clear. IMPRESSION: 1. Severely limited study due to extensive motion artifact and patient's inability to tolerate the full length of the exam. 2. No definite acute abnormality identified. If there remains high clinical suspicion for possible occult intracranial process, a repeat study when the patient is able to tolerate the exam could be attempted. Electronically Signed   By: Rise MuBenjamin  McClintock M.D.   On: 03/17/2016 21:56   Dg Chest Port 1 View  Result Date: 03/16/2016 CLINICAL DATA:  Status post catheter placement. EXAM: PORTABLE CHEST 1 VIEW COMPARISON:  Radiographs of March 11, 2016. FINDINGS: Stable cardiomegaly. Atherosclerosis of thoracic aorta is noted. No pneumothorax or pleural effusion is noted. Stable calcified pleural plaques are noted. Interval placement of right-sided PICC line with distal tip in expected position of cavoatrial junction. No acute pulmonary disease is noted. Stable sclerotic density seen in proximal right humerus most consistent with enchondroma. IMPRESSION: Aortic atherosclerosis. Interval placement of right-sided PICC line with distal tip in expected position of cavoatrial junction. No pneumothorax is noted. Electronically Signed   By: Lupita RaiderJames  Green Jr, M.D.   On: 03/16/2016 18:02   Dg Chest Port 1 View  Result Date: 02/23/2016 CLINICAL DATA:  Possible GI bleeding. EXAM: PORTABLE CHEST  1 VIEW COMPARISON:  02/04/2016 FINDINGS: Cardiomediastinal silhouette is stably enlarged. Mediastinal contours appear intact. Tortuosity and atherosclerotic calcifications of the aorta are seen. There is no evidence of focal airspace consolidation, pleural effusion or pneumothorax. Stable hyperdense pleural plaques are seen. There is improvement in the degree of hyperinflation noted previously. Mixed density lesion within the right proximal humerus is stable. Osteoarthritic changes of bilateral glenohumeral joints are noted. Osseous structures are otherwise without acute abnormality. Soft tissues are grossly normal. IMPRESSION: No evidence of acute pulmonary infiltrate or pleural effusions. Decreased hyperinflation of the lungs. Stable cardiomegaly. Electronically Signed   By: Ted Mcalpineobrinka  Dimitrova M.D.   On: 02/23/2016 09:13   Dg Abd Portable 1v  Result Date: 03/14/2016 CLINICAL DATA:  Constipation for 4 days, has had several enemas EXAM: PORTABLE ABDOMEN - 1 VIEW COMPARISON:  22,017 FINDINGS: Normal retained stool burden. No bowel dilatation bowel wall thickening. Scattered atherosclerotic calcification with multiple stents in the LEFT iliac system. Bones demineralized with degenerative changes of the lumbar spine. IMPRESSION: Normal retained stool burden. Stable gas pattern. Electronically Signed   By: Ulyses SouthwardMark  Boles M.D.   On: 03/14/2016 13:00   Dg Abd Portable 1v  Result Date: 03/12/2016 CLINICAL DATA:  Constipation. EXAM: PORTABLE ABDOMEN - 1 VIEW COMPARISON:  10/01/2008. FINDINGS: Soft tissue structures are unremarkable. Prominent amount of stool noted throughout the colon and rectum consistent with constipation. Aortoiliac stent grafts noted. Peripheral vascular calcification. Stable pelvic calcifications consistent  with phleboliths.Degenerative changes lumbar spine and both hips. IMPRESSION: 1. Prominent amount stool noted throughout the colon and rectum consistent with constipation. No bowel  distention. 2.  Aortoiliac stent graft noted.  Peripheral vascular disease . Electronically Signed   By: Maisie Fus  Register   On: 03/12/2016 16:55     Subjective: - pleasantly confused, no complaints  Discharge Exam: Vitals:   03/21/16 2142 03/22/16 0529  BP: (!) 160/84 (!) 164/78  Pulse: 90 71  Resp: 18 18  Temp: 97.8 F (36.6 C) 97.5 F (36.4 C)   Vitals:   03/21/16 0600 03/21/16 1842 03/21/16 2142 03/22/16 0529  BP:  (!) 139/96 (!) 160/84 (!) 164/78  Pulse:  (!) 55 90 71  Resp:  16 18 18   Temp:  98.4 F (36.9 C) 97.8 F (36.6 C) 97.5 F (36.4 C)  TempSrc:   Oral Oral  SpO2:  100% 98% 98%  Weight:    44.5 kg (98 lb 3.2 oz)  Height: 4\' 11"  (1.499 m)       General: Pt is alert, awake, not in acute distress Cardiovascular: irregular Respiratory: CTA bilaterally, no wheezing, no rhonchi Abdominal: Soft, NT, ND, bowel sounds + Extremities: no edema, no cyanosis    The results of significant diagnostics from this hospitalization (including imaging, microbiology, ancillary and laboratory) are listed below for reference.     Microbiology: No results found for this or any previous visit (from the past 240 hour(s)).   Labs: BNP (last 3 results) No results for input(s): BNP in the last 8760 hours. Basic Metabolic Panel:  Recent Labs Lab 03/16/16 0604 03/17/16 0223 03/18/16 0415 03/18/16 0909  NA 138 138 137  --   K 3.8 3.0* 3.8  --   CL 102 104 105  --   CO2 25 27 26   --   GLUCOSE 85 112* 103*  --   BUN 8 7 <5*  --   CREATININE 1.02* 1.00 0.94  --   CALCIUM 8.5* 7.9* 7.7*  --   MG  --   --   --  1.5*   Liver Function Tests:  Recent Labs Lab 03/18/16 0415  AST 29  ALT 24  ALKPHOS 42  BILITOT 0.5  PROT 5.2*  ALBUMIN 2.3*   No results for input(s): LIPASE, AMYLASE in the last 168 hours.  Recent Labs Lab 03/17/16 1455  AMMONIA 34   CBC:  Recent Labs Lab 03/16/16 0604 03/17/16 0223 03/18/16 0415  WBC 11.0* 10.2 11.5*  HGB 10.9* 9.7* 9.6*   HCT 34.3* 30.6* 30.7*  MCV 88.9 89.0 89.2  PLT 386 367 369   Cardiac Enzymes: No results for input(s): CKTOTAL, CKMB, CKMBINDEX, TROPONINI in the last 168 hours. BNP: Invalid input(s): POCBNP CBG:  Recent Labs Lab 03/17/16 0825 03/18/16 0743 03/19/16 0744 03/20/16 0751 03/22/16 0735  GLUCAP 107* 101* 88 75 75   D-Dimer No results for input(s): DDIMER in the last 72 hours. Hgb A1c No results for input(s): HGBA1C in the last 72 hours. Lipid Profile No results for input(s): CHOL, HDL, LDLCALC, TRIG, CHOLHDL, LDLDIRECT in the last 72 hours. Thyroid function studies No results for input(s): TSH, T4TOTAL, T3FREE, THYROIDAB in the last 72 hours.  Invalid input(s): FREET3 Anemia work up No results for input(s): VITAMINB12, FOLATE, FERRITIN, TIBC, IRON, RETICCTPCT in the last 72 hours. Urinalysis    Component Value Date/Time   COLORURINE YELLOW 03/17/2016 1507   APPEARANCEUR CLEAR 03/17/2016 1507   LABSPEC 1.006 03/17/2016 1507   PHURINE 8.0  03/17/2016 1507   GLUCOSEU NEGATIVE 03/17/2016 1507   GLUCOSEU NEGATIVE 02/13/2016 1201   HGBUR NEGATIVE 03/17/2016 1507   BILIRUBINUR NEGATIVE 03/17/2016 1507   KETONESUR NEGATIVE 03/17/2016 1507   PROTEINUR NEGATIVE 03/17/2016 1507   UROBILINOGEN 0.2 02/13/2016 1201   NITRITE NEGATIVE 03/17/2016 1507   LEUKOCYTESUR SMALL (A) 03/17/2016 1507   Sepsis Labs Invalid input(s): PROCALCITONIN,  WBC,  LACTICIDVEN Microbiology No results found for this or any previous visit (from the past 240 hour(s)).   Time coordinating discharge: Over 30 minutes  SIGNED:  Pamella Pert, MD  [facility] 03/22/2016, 9:02 AM Pager [phone]  If 7PM-7AM, please contact night-coverage [email or web address] Password [email or web address]

## 2016-03-22 NOTE — Progress Notes (Addendum)
Foley catheter removed by day-shift nurse at 12PM. Patient has not since voided and does not feel the urge to void. Patient is refusing to eat and drink and does not have any IV fluids infusing at this time. Schorr,NP paged. Schorr,NP returned page with orders to only in and out cath patient if bladder scan is =/> or patient has strong urge to void and is unable. No in and out cath done at this time. Will continue to monitor and treat per MD order. Bladder scanned patient at 0530 and volume was . In and out cath completed with a volume of . Post void cath residual was 0 mL. Will continue to monitor and treat per MD orders.

## 2016-03-22 NOTE — Care Management Note (Signed)
Case Management Note  Patient Details  Name: Tracy [name] MRN: [id number] Date of Birth: 06-11-1926  Subjective/Objective:                    Action/Plan: Plan is to dc/ to SNF today. CSW managing d/c to SNF.  Expected Discharge Date:    03/22/2016              Expected Discharge Plan:  Home w Home Health Services  In-House Referral:  Clinical Social Work  Discharge planning Services  CM Consult  Status of Service:  Completed, signed off  If discussed at Long Length of Stay Meetings, dates discussed:    Additional Comments:  Epifanio LeschesCole, Tracy Hoelzel Hudson, RN 03/22/2016, 11:27 AM

## 2016-03-22 NOTE — Progress Notes (Signed)
Patient will DC to: [facility]  Anticipated DC date: 03/22/16 Family notified: Son Transport by: PTAr 1:30pm   Per MD patient ready for DC to [facility]. RN, patient, patient's family, and facility notified of DC. Discharge Summary sent to facility. RN given number for report. DC packet on chart. Ambulance transport requested for patient.   CSW signing off.  Cristobal GoldmannNadia Earland Reish, [place] Clinical Social Worker [phone]

## 2016-03-22 NOTE — Clinical Social Work Placement (Signed)
   CLINICAL SOCIAL WORK PLACEMENT  NOTE  Date:  03/22/2016  Patient Details  Name: Tracy [name] MRN: [id number] Date of Birth: 1926-12-24  Clinical Social Work is seeking post-discharge placement for this patient at the Skilled  Nursing Facility level of care (*CSW will initial, date and re-position this form in  chart as items are completed):  Yes   Patient/family provided with [facility] Clinical Social Work Department's list of facilities offering this level of care within the geographic area requested by the patient (or if unable, by the patient's family).  Yes   Patient/family informed of their freedom to choose among providers that offer the needed level of care, that participate in Medicare, Medicaid or managed care program needed by the patient, have an available bed and are willing to accept the patient.  Yes   Patient/family informed of [facility]'s ownership interest in [facility] and [facility], as well as of the fact that they are under no obligation to receive care at these facilities.  PASRR submitted to EDS on 03/17/16     PASRR number received on 03/17/16     Existing PASRR number confirmed on       FL2 transmitted to all facilities in geographic area requested by pt/family on 03/17/16     FL2 transmitted to all facilities within larger geographic area on       Patient informed that his/her managed care company has contracts with or will negotiate with certain facilities, including the following:        Yes   Patient/family informed of bed offers received.  Patient chooses bed at [facility]     Physician recommends and patient chooses bed at      Patient to be transferred to [facility] on 03/22/16.  Patient to be transferred to facility by [facility]     Patient family notified on 03/22/16 of transfer.  Name of family member notified:  Lambert Ketolonzo, son      PHYSICIAN       Additional Comment:     _______________________________________________ Mearl LatinNadia S Antoinne Spadaccini, LCSWA 03/22/2016, 11:29 AM

## 2016-03-23 ENCOUNTER — Telehealth: Payer: Self-pay | Admitting: *Deleted

## 2016-03-23 NOTE — Telephone Encounter (Signed)
Pt was on TCM list admitted for Acute kidney injury. Pt was d/c 12/18 and sent to SNF. Will need to f/u w/PCP once discharge from SNF...Raechel Chute/lmb

## 2016-03-24 DIAGNOSIS — D72829 Elevated white blood cell count, unspecified: Secondary | ICD-10-CM | POA: Diagnosis not present

## 2016-03-24 DIAGNOSIS — I10 Essential (primary) hypertension: Secondary | ICD-10-CM | POA: Diagnosis not present

## 2016-03-24 DIAGNOSIS — E876 Hypokalemia: Secondary | ICD-10-CM | POA: Diagnosis not present

## 2016-03-24 DIAGNOSIS — R531 Weakness: Secondary | ICD-10-CM | POA: Diagnosis not present

## 2016-03-24 DIAGNOSIS — I4891 Unspecified atrial fibrillation: Secondary | ICD-10-CM | POA: Diagnosis not present

## 2016-03-30 ENCOUNTER — Ambulatory Visit (INDEPENDENT_AMBULATORY_CARE_PROVIDER_SITE_OTHER): Payer: Medicare Other | Admitting: Family Medicine

## 2016-03-30 ENCOUNTER — Ambulatory Visit: Payer: Self-pay

## 2016-03-30 ENCOUNTER — Encounter: Payer: Self-pay | Admitting: Family Medicine

## 2016-03-30 VITALS — BP 128/80 | HR 64

## 2016-03-30 DIAGNOSIS — M25512 Pain in left shoulder: Secondary | ICD-10-CM

## 2016-03-30 DIAGNOSIS — M12811 Other specific arthropathies, not elsewhere classified, right shoulder: Secondary | ICD-10-CM

## 2016-03-30 DIAGNOSIS — G8929 Other chronic pain: Secondary | ICD-10-CM | POA: Diagnosis not present

## 2016-03-30 DIAGNOSIS — M75101 Unspecified rotator cuff tear or rupture of right shoulder, not specified as traumatic: Secondary | ICD-10-CM

## 2016-03-30 DIAGNOSIS — M25511 Pain in right shoulder: Secondary | ICD-10-CM | POA: Diagnosis not present

## 2016-03-30 DIAGNOSIS — M75102 Unspecified rotator cuff tear or rupture of left shoulder, not specified as traumatic: Secondary | ICD-10-CM

## 2016-03-30 DIAGNOSIS — M12812 Other specific arthropathies, not elsewhere classified, left shoulder: Secondary | ICD-10-CM | POA: Diagnosis not present

## 2016-03-30 NOTE — Patient Instructions (Addendum)
Great to see yo u Happy New Year! You look good overall We injected both shoulders  See me again in 10 weeks!

## 2016-03-30 NOTE — Assessment & Plan Note (Signed)
Bilateral injections given today. We discussed topical anti-inflammatories. Patient has many medical problems I do not feel that anything more aggressive would be beneficial for the patient. Patient will continue to monitor blood sugars. Patient is in a assisted living facility now. Discussed with family in great length about potentially increasing appetite and we will discuss with other physicians. Follow-up again with me 10 weeks.  Spent  25 minutes with patient face-to-face and had greater than 50% of counseling including as described above in assessment and plan.

## 2016-03-30 NOTE — Assessment & Plan Note (Signed)
As stated above. Repeat injection given today. Continue conservative therapy. All was worsening symptoms.

## 2016-03-30 NOTE — Progress Notes (Signed)
Tracy [name] D.O. [facility] [address] [place], [place] [address] Phone: [phone] Subjective:     CC: Bilateral shoulder pain Follow-up Has been a chronic issue with rotator cuff arthritic changes.  BJY:NWGNFAOZHYHPI:Subjective  Tracy [name] is a 82 y.o. female coming in with complaint of bilateral shoulder pain. . Does have known severe osteoarthritic changes of the shoulders bilaterally with rotator cuff arthropathy. Bilateral shoulders are hurting. Right greater than left. Patient has recently moved into a assisted nursing home that has made it very difficult. Patient feels that she is not doing well overall but the pain is seem to be worsening. Wants to have bilateral injections again.   Past Medical History:  Diagnosis Date  . Atrial fibrillation (HCC)   . CAROTID STENOSIS   . CVA (cerebrovascular accident) (HCC) 2002  . DIABETES MELLITUS, TYPE II   . Diverticulosis of colon   . DIVERTICULOSIS, COLON   . DM (diabetes mellitus), type 2 (HCC)   . DVT (deep venous thrombosis) (HCC)   . GERD   . GERD (gastroesophageal reflux disease)   . HTN (hypertension)   . Hx of adenomatous colonic polyps 2007  . Hyperlipidemia   . HYPERLIPIDEMIA   . Hypertension   . Osteopenia   . OSTEOPENIA   . PVD (peripheral vascular disease) (HCC)    recently dx w/ carotid -left  int carotid >90% 50-60% rt int carotid 02/2010  . PVD (peripheral vascular disease) (HCC)    lf  ABI 0.11 and rt 0.43  . Sinus node dysfunction (HCC) 2011  . Stroke [facility](HCC) July 01, 2014   Affected Left side  . Upper GI bleed 02/2016   Past Surgical History:  Procedure Laterality Date  . CAROTID ENDARTERECTOMY    . cataract eye surgery    . CNS vascular stent per Neurosurgeon    . ESOPHAGOGASTRODUODENOSCOPY (EGD) WITH PROPOFOL N/A 02/24/2016   Procedure: ESOPHAGOGASTRODUODENOSCOPY (EGD) WITH PROPOFOL;  Surgeon: Kathi DerParag Brahmbhatt, MD;  Location: [facility];  Service: Gastroenterology;  Laterality: N/A;  .  EYE SURGERY    . ILIAC ARTERY STENT  2011   Bilateral CIA stent  . lazer eye surgery    . RADIOLOGY WITH ANESTHESIA N/A 03/20/2016   Procedure: MRI OF BRAIN;  Surgeon: Medication Radiologist, MD;  Location: [facility] OR;  Service: Radiology;  Laterality: N/A;  . Right Shoulder Surgery  2010  . TUBAL LIGATION      No Known Allergies Family History  Problem Relation Age of Onset  . Cancer Mother     colon  . Diabetes Sister     Amputation        Past medical history, social, surgical and family history all reviewed in electronic medical record.   Review of Systems: No , visual changes, nausea, vomiting, diarrhea, constipation, dizziness, abdominal pain, skin rash, fevers, chills, night sweats, chest pain, shortness of breath, positive for weight loss secondary to decreased appetite significant pains all over she states.    Objective  Blood pressure 128/80, pulse 64, SpO2 99 %.  General: No apparent distress alert and oriented x3 mood and affect normal, dressed appropriately. FrailWeight loss since previous exam. Cachectic HEENT: Pupils equal, extraocular movements intact  Respiratory: Patient's speak in full sentences and does not appear short of breath  Cardiovascular: No lower extremity edema, non tender, no erythema  Skin: Warm dry intact with no signs of infection or rash on extremities or on axial skeleton.  Abdomen: Soft nontender  Neuro: Cranial nerves II  through XII are intact, neurovascularly intact in all extremities with 2+ DTRs and 2+ pulses.  Lymph: No lymphadenopathy of posterior or anterior cervical chain or axillae bilaterally.  Gait slow gait with some shoveling.  MSK:  Non tender with full range of motion and good stability and symmetric strength and tone of shoulders, elbows, wrist, hip, knee and ankles bilaterally. Patient though is very frail and does have osteoarthritic changes of multiple joints.No change. Shoulder: Bilateral Inspection reveals significant  atrophy of the musculature bilaterally Decreased ROM and strengthening. Exam  Positive signs of impingement bilaterally right greater than left Speeds and Yergason's tests normal. No labral pathology noted with negative Obrien's, negative clunk and good stability. Normal scapular function observed. painful arc and positive drop arm sign.  Procedure: Real-time Ultrasound Guided Injection of right glenohumeral joint Device: GE Logiq E  Ultrasound guided injection is preferred based studies that show increased duration, increased effect, greater accuracy, decreased procedural pain, increased response rate with ultrasound guided versus blind injection.  Verbal informed consent obtained.  Time-out conducted.  Noted no overlying erythema, induration, or other signs of local infection.  Skin prepped in a sterile fashion.  Local anesthesia: Topical Ethyl chloride.  With sterile technique and under real time ultrasound guidance:  Joint visualized.  23g 1  inch needle inserted posterior approach. Pictures taken for needle placement. Patient did have injection of 2 cc of 1% lidocaine, 2 cc of 0.5% Marcaine, and 1.0 cc of Kenalog 40 mg/dL. Completed without difficulty  Pain immediately resolved suggesting accurate placement of the medication.  Advised to call if fevers/chills, erythema, induration, drainage, or persistent bleeding.  Images permanently stored and available for review in the ultrasound unit.  Impression: Technically successful ultrasound guided injection.   Procedure: Real-time Ultrasound Guided Injection of left glenohumeral joint Device: GE Logiq E  Ultrasound guided injection is preferred based studies that show increased duration, increased effect, greater accuracy, decreased procedural pain, increased response rate with ultrasound guided versus blind injection.  Verbal informed consent obtained.  Time-out conducted.  Noted no overlying erythema, induration, or other signs of  local infection.  Skin prepped in a sterile fashion.  Local anesthesia: Topical Ethyl chloride.  With sterile technique and under real time ultrasound guidance:  Joint visualized.  23g 1  inch needle inserted posterior approach. Pictures taken for needle placement. Patient did have injection of 2 cc of 1% lidocaine, 2 cc of 0.5% Marcaine, and 1cc of Kenalog 40 mg/dL. Completed without difficulty  Pain immediately resolved suggesting accurate placement of the medication.  Advised to call if fevers/chills, erythema, induration, drainage, or persistent bleeding.  Images permanently stored and available for review in the ultrasound unit.  Impression: Technically successful ultrasound guided injection.       Impression and Recommendations:     This case required medical decision making of moderate complexity.

## 2016-03-31 ENCOUNTER — Ambulatory Visit: Payer: Medicare Other | Admitting: Family Medicine

## 2016-04-05 DIAGNOSIS — I82409 Acute embolism and thrombosis of unspecified deep veins of unspecified lower extremity: Secondary | ICD-10-CM | POA: Diagnosis not present

## 2016-04-06 ENCOUNTER — Ambulatory Visit: Payer: Medicare Other | Admitting: Family

## 2016-04-06 ENCOUNTER — Encounter (HOSPITAL_COMMUNITY): Payer: Medicare Other

## 2016-04-12 ENCOUNTER — Telehealth: Payer: Self-pay | Admitting: Internal Medicine

## 2016-04-12 NOTE — Telephone Encounter (Signed)
noted 

## 2016-04-12 NOTE — Telephone Encounter (Signed)
Ron from [facility] states he was going to see pt today for her start of care and her INR.  He needs her POA to sign for it and he is out of town till late.  Ron wanted to let you know this care won't start until tomorrow.

## 2016-04-13 ENCOUNTER — Ambulatory Visit (INDEPENDENT_AMBULATORY_CARE_PROVIDER_SITE_OTHER): Payer: Medicare Other | Admitting: General Practice

## 2016-04-13 DIAGNOSIS — I700 Atherosclerosis of aorta: Secondary | ICD-10-CM | POA: Diagnosis not present

## 2016-04-13 DIAGNOSIS — I4891 Unspecified atrial fibrillation: Secondary | ICD-10-CM

## 2016-04-13 DIAGNOSIS — I10 Essential (primary) hypertension: Secondary | ICD-10-CM | POA: Diagnosis not present

## 2016-04-13 DIAGNOSIS — E785 Hyperlipidemia, unspecified: Secondary | ICD-10-CM | POA: Diagnosis not present

## 2016-04-13 DIAGNOSIS — I482 Chronic atrial fibrillation: Secondary | ICD-10-CM | POA: Diagnosis not present

## 2016-04-13 DIAGNOSIS — Z7901 Long term (current) use of anticoagulants: Secondary | ICD-10-CM | POA: Diagnosis not present

## 2016-04-13 DIAGNOSIS — Z5181 Encounter for therapeutic drug level monitoring: Secondary | ICD-10-CM | POA: Insufficient documentation

## 2016-04-13 DIAGNOSIS — E1151 Type 2 diabetes mellitus with diabetic peripheral angiopathy without gangrene: Secondary | ICD-10-CM | POA: Diagnosis not present

## 2016-04-13 DIAGNOSIS — K219 Gastro-esophageal reflux disease without esophagitis: Secondary | ICD-10-CM | POA: Diagnosis not present

## 2016-04-13 DIAGNOSIS — Z8673 Personal history of transient ischemic attack (TIA), and cerebral infarction without residual deficits: Secondary | ICD-10-CM | POA: Diagnosis not present

## 2016-04-13 DIAGNOSIS — R627 Adult failure to thrive: Secondary | ICD-10-CM | POA: Diagnosis not present

## 2016-04-13 DIAGNOSIS — K279 Peptic ulcer, site unspecified, unspecified as acute or chronic, without hemorrhage or perforation: Secondary | ICD-10-CM | POA: Diagnosis not present

## 2016-04-13 LAB — POCT INR: INR: 3.3

## 2016-04-13 NOTE — Progress Notes (Signed)
I have reviewed and agree with the plan. 

## 2016-04-13 NOTE — Patient Instructions (Signed)
Pre visit review using our clinic review tool, if applicable. No additional management support is needed unless otherwise documented below in the visit note. 

## 2016-04-14 ENCOUNTER — Encounter: Payer: Self-pay | Admitting: Internal Medicine

## 2016-04-15 ENCOUNTER — Encounter (HOSPITAL_COMMUNITY): Payer: Medicare Other

## 2016-04-15 ENCOUNTER — Ambulatory Visit: Payer: Medicare Other | Admitting: Family

## 2016-04-15 DIAGNOSIS — Z8673 Personal history of transient ischemic attack (TIA), and cerebral infarction without residual deficits: Secondary | ICD-10-CM | POA: Diagnosis not present

## 2016-04-15 DIAGNOSIS — I700 Atherosclerosis of aorta: Secondary | ICD-10-CM | POA: Diagnosis not present

## 2016-04-15 DIAGNOSIS — R627 Adult failure to thrive: Secondary | ICD-10-CM | POA: Diagnosis not present

## 2016-04-15 DIAGNOSIS — I482 Chronic atrial fibrillation: Secondary | ICD-10-CM | POA: Diagnosis not present

## 2016-04-15 DIAGNOSIS — E785 Hyperlipidemia, unspecified: Secondary | ICD-10-CM | POA: Diagnosis not present

## 2016-04-15 DIAGNOSIS — I10 Essential (primary) hypertension: Secondary | ICD-10-CM | POA: Diagnosis not present

## 2016-04-15 DIAGNOSIS — K219 Gastro-esophageal reflux disease without esophagitis: Secondary | ICD-10-CM | POA: Diagnosis not present

## 2016-04-15 DIAGNOSIS — K279 Peptic ulcer, site unspecified, unspecified as acute or chronic, without hemorrhage or perforation: Secondary | ICD-10-CM | POA: Diagnosis not present

## 2016-04-15 DIAGNOSIS — Z5181 Encounter for therapeutic drug level monitoring: Secondary | ICD-10-CM | POA: Diagnosis not present

## 2016-04-15 DIAGNOSIS — E1151 Type 2 diabetes mellitus with diabetic peripheral angiopathy without gangrene: Secondary | ICD-10-CM | POA: Diagnosis not present

## 2016-04-15 DIAGNOSIS — Z7901 Long term (current) use of anticoagulants: Secondary | ICD-10-CM | POA: Diagnosis not present

## 2016-04-16 ENCOUNTER — Ambulatory Visit (INDEPENDENT_AMBULATORY_CARE_PROVIDER_SITE_OTHER): Payer: Medicare Other | Admitting: Gastroenterology

## 2016-04-16 ENCOUNTER — Other Ambulatory Visit (INDEPENDENT_AMBULATORY_CARE_PROVIDER_SITE_OTHER): Payer: Medicare Other

## 2016-04-16 ENCOUNTER — Encounter: Payer: Self-pay | Admitting: Gastroenterology

## 2016-04-16 ENCOUNTER — Encounter (INDEPENDENT_AMBULATORY_CARE_PROVIDER_SITE_OTHER): Payer: Self-pay

## 2016-04-16 VITALS — BP 120/68 | HR 96 | Ht 59.0 in | Wt 96.2 lb

## 2016-04-16 DIAGNOSIS — K250 Acute gastric ulcer with hemorrhage: Secondary | ICD-10-CM

## 2016-04-16 DIAGNOSIS — D649 Anemia, unspecified: Secondary | ICD-10-CM

## 2016-04-16 DIAGNOSIS — K5900 Constipation, unspecified: Secondary | ICD-10-CM

## 2016-04-16 DIAGNOSIS — K259 Gastric ulcer, unspecified as acute or chronic, without hemorrhage or perforation: Secondary | ICD-10-CM | POA: Diagnosis not present

## 2016-04-16 LAB — CBC WITH DIFFERENTIAL/PLATELET
BASOS PCT: 0.3 % (ref 0.0–3.0)
Basophils Absolute: 0 10*3/uL (ref 0.0–0.1)
EOS PCT: 1.8 % (ref 0.0–5.0)
Eosinophils Absolute: 0.2 10*3/uL (ref 0.0–0.7)
HCT: 28.4 % — ABNORMAL LOW (ref 36.0–46.0)
Hemoglobin: 9.3 g/dL — ABNORMAL LOW (ref 12.0–15.0)
LYMPHS ABS: 1.3 10*3/uL (ref 0.7–4.0)
Lymphocytes Relative: 9.9 % — ABNORMAL LOW (ref 12.0–46.0)
MCHC: 32.9 g/dL (ref 30.0–36.0)
MCV: 84.9 fl (ref 78.0–100.0)
MONO ABS: 0.3 10*3/uL (ref 0.1–1.0)
MONOS PCT: 2.4 % — AB (ref 3.0–12.0)
NEUTROS ABS: 11.1 10*3/uL — AB (ref 1.4–7.7)
Neutrophils Relative %: 85.6 % — ABNORMAL HIGH (ref 43.0–77.0)
Platelets: 390 10*3/uL (ref 150.0–400.0)
RBC: 3.34 Mil/uL — ABNORMAL LOW (ref 3.87–5.11)
RDW: 18.5 % — AB (ref 11.5–15.5)
WBC: 12.9 10*3/uL — ABNORMAL HIGH (ref 4.0–10.5)

## 2016-04-16 MED ORDER — POLYETHYLENE GLYCOL 3350 17 GM/SCOOP PO POWD: ORAL | 3 refills | Status: DC

## 2016-04-16 NOTE — Patient Instructions (Addendum)
If you are age 81 or older, your body mass index should be between 23-30. Your Body mass index is 19.44 kg/m. If this is out of the aforementioned range listed, please consider follow up with your Primary Care Provider.  If you are age 81 or younger, your body mass index should be between 19-25. Your Body mass index is 19.44 kg/m. If this is out of the aformentioned range listed, please consider follow up with your Primary Care Provider.   You have been scheduled for an endoscopy. Please follow written instructions given to you at your visit today. If you use inhalers (even only as needed), please bring them with you on the day of your procedure. Your physician has requested that you go to www.startemmi.com and enter the access code given to you at your visit today. This web site gives a general overview about your procedure. However, you should still follow specific instructions given to you by our office regarding your preparation for the procedure.  Your physician has requested that you go to the basement for the following lab work before leaving today:  CBC  We have sent the following medications to your pharmacy for you to pick up at your convenience:  Miralax  Thank you

## 2016-04-16 NOTE — Progress Notes (Signed)
HPI :  83 y/o female history of AF previously on anticoagulation, history of CVA, DVT, history of GI bleeding due to gastric ulcer, here for a follow up visit. She was admitted to the hospital on 02/23/16 for GI bleeding. She had an endoscopy by Dr. Berton Lan of [facility] showing large gastric ulcer, and ulcer at the GEJ, with large hiatal hernia. This occurred in the setting of Eliquis. She has not been seen in our office since 2011 at which time she was followed by Dr. Jarold Motto.  Last CBC on 12/14 - Hgb of 9.6. She had an iron deficiency on 11/1 noted.   Echocardiogram on 02/24/16 showing EF of 55-60%  She is taking aspirin 325mg  daily and is taking protonix 40mg  BID. Husband reports she was started on coumadin and aspirin. No symptoms of bleeding. No heartburn. She denies any pain in her abdomen. No FH of stomach cancer. She uses tylenol PRN, no other NSAIDs.  She does endorse some discomfort in her RLQ at times for the past few days. She has been feeling constipated lately and with a bowel movement the pain is a little better. She is taking a stool softener over the counter. She has a bowel movement 2-3 times per day, but small amount of hard stools with each BM. She denies any blood in the stool, no dark stools, normal brown in colored. She has a history of rectal prolapse with stercoral ulcer from constipation.   Endoscopic history: EGD 02/10/16 - 4cm hiatal hernia, ulcer at the GEJ, 2.5cm ulcer in the stomach, ? Pyloric stenosis Colonoscopy 11/28/09 - severe diverticulosis, rectal prolapse with stercoral ulcer EGD 10/01/05 - 5cm HH, gastritis Colonoscopy 10/01/05 - small polyps, diverticulosis, hemorrhoids  Past Medical History:  Diagnosis Date  . Atrial fibrillation (HCC)   . CAROTID STENOSIS   . CVA (cerebrovascular accident) (HCC) 2002  . DIABETES MELLITUS, TYPE II   . DIVERTICULOSIS, COLON   . DM (diabetes mellitus), type 2 (HCC)   . DVT (deep venous thrombosis) (HCC)   . Gastric  ulcer   . GERD   . HTN (hypertension)   . Hx of adenomatous colonic polyps 2007  . Hyperlipidemia   . Hypertension   . OSTEOPENIA   . PVD (peripheral vascular disease) (HCC)    recently dx w/ carotid -left  int carotid >90% 50-60% rt int carotid 02/2010  . PVD (peripheral vascular disease) (HCC)    lf  ABI 0.11 and rt 0.43  . Sinus node dysfunction (HCC) 2011  . Stroke [facility]) July 01, 2014   Affected Left side  . Upper GI bleed 02/2016     Past Surgical History:  Procedure Laterality Date  . CAROTID ENDARTERECTOMY    . cataract eye surgery    . CNS vascular stent per Neurosurgeon    . ESOPHAGOGASTRODUODENOSCOPY (EGD) WITH PROPOFOL N/A 02/24/2016   Procedure: ESOPHAGOGASTRODUODENOSCOPY (EGD) WITH PROPOFOL;  Surgeon: Kathi Der, MD;  Location: [facility];  Service: Gastroenterology;  Laterality: N/A;  . EYE SURGERY    . ILIAC ARTERY STENT  2011   Bilateral CIA stent  . lazer eye surgery    . RADIOLOGY WITH ANESTHESIA N/A 03/20/2016   Procedure: MRI OF BRAIN;  Surgeon: Medication Radiologist, MD;  Location: [facility] OR;  Service: Radiology;  Laterality: N/A;  . Right Shoulder Surgery  2010  . TUBAL LIGATION     Family History  Problem Relation Age of Onset  . Colon cancer Mother   . Diabetes Sister  Amputation   Social History  Substance Use Topics  . Smoking status: Former Games developer  . Smokeless tobacco: Never Used     Comment: QUIT IN 6  . Alcohol use No   Current Outpatient Prescriptions  Medication Sig Dispense Refill  . aspirin 325 MG tablet Take 325 mg by mouth daily.    Marland Kitchen atorvastatin (LIPITOR) 10 MG tablet 1/2 tab by mouth daily (Patient taking differently: Take 5 mg by mouth daily at 6 PM. 1/2 tab by mouth daily) 45 tablet 3  . diltiazem (CARDIZEM CD) 180 MG 24 hr capsule Take 1 capsule (180 mg total) by mouth daily. 30 capsule 0  . Docusate Calcium (STOOL SOFTENER PO) Take 1 tablet by mouth at bedtime.    . donepezil (ARICEPT) 5 MG tablet Take 1 tablet  (5 mg total) by mouth at bedtime.    . feeding supplement, ENSURE ENLIVE, (ENSURE ENLIVE) LIQD Take 237 mLs by mouth 2 (two) times daily between meals. 30 Bottle 1  . fenofibrate micronized (LOFIBRA) 134 MG capsule TAKE 1 CAPSULE (134 MG TOTAL) BY MOUTH DAILY. 90 capsule 3  . metoprolol tartrate (LOPRESSOR) 25 MG tablet Take 0.5 tablets (12.5 mg total) by mouth 2 (two) times daily. 60 tablet   . Multiple Vitamin (MULTIVITAMIN) capsule Take 1 capsule by mouth daily. Centrum Silver    . Multiple Vitamins-Minerals (PRESERVISION AREDS 2) CAPS Take 1 capsule by mouth 2 (two) times daily.    Marland Kitchen oxybutynin (DITROPAN-XL) 5 MG 24 hr tablet TAKE 1 TABLET BY MOUTH EVERY DAY 90 tablet 3  . pantoprazole (PROTONIX) 40 MG tablet Take 1 tablet (40 mg total) by mouth 2 (two) times daily before a meal. 60 tablet 1  . pentoxifylline (TRENTAL) 400 MG CR tablet TAKE 1 TABLET (400 MG TOTAL) BY MOUTH DAILY. 90 tablet 3   No current facility-administered medications for this visit.    No Known Allergies   Review of Systems: All systems reviewed and negative except where noted in HPI.   Lab Results  Component Value Date   WBC 12.9 (H) 04/16/2016   HGB 9.3 (L) 04/16/2016   HCT 28.4 (L) 04/16/2016   MCV 84.9 04/16/2016   PLT 390.0 04/16/2016    Lab Results  Component Value Date   CREATININE 0.94 03/18/2016   BUN <5 (L) 03/18/2016   NA 137 03/18/2016   K 3.8 03/18/2016   CL 105 03/18/2016   CO2 26 03/18/2016     Physical Exam: BP 120/68 (BP Location: Left Arm, Patient Position: Sitting, Cuff Size: Normal)   Pulse 96 Comment: irregular  Ht 4\' 11"  (1.499 m)   Wt 96 lb 4 oz (43.7 kg)   BMI 19.44 kg/m  Constitutional: Pleasant,well-developed, female in no acute distress. HEENT: Normocephalic and atraumatic.  No scleral icterus. Neck supple.  Cardiovascular: Normal rate, irregularly irregular.  Pulmonary/chest: Effort normal and breath sounds normal. No wheezing, rales or rhonchi. Abdominal: Soft,  nondistended, nontender.  There are no masses palpable. No hepatomegaly. Extremities: no edema Lymphadenopathy: No cervical adenopathy noted. Neurological: Alert and oriented to person place and time. Skin: Skin is warm and dry. No rashes noted. Psychiatric: Normal mood and affect. Behavior is normal.   ASSESSMENT AND PLAN: 83 year old female with multiple comorbidities as outlined above here for a visit after her hospitalization as outlined above:  Gastric ulcer / anemia - large gastric ulcer leading to severe GI bleeding requiring hospitalization in November. She is on Protonix twice a day and has felt fairly well  in this regard however continues regular aspirin daily and recently had Coumadin added back to her regimen. I repeated a CBC and her anemia is persistent with a hemoglobin in the nines compared to a month ago. The patient will be called with the lab results I think is reasonable to add iron onto her regimen at this time given her persistent anemia and initiation of anticoagulation. She will continue protonix twice daily. She warrants a follow-up endoscopy to ensure appropriate healing of the gastric ulcer and exclude malignancy. She will need to have the Coumadin held for 5 days prior to this procedure, we will recheck to Dr. Jonny RuizJohn to ensure this is okay. Her exam is scheduled for next available date in roughly 10 days.  Constipation -severe constipation at baseline, recommend starting MiraLAX one dose twice a day, and titrate up as needed. Iron will make her constipation worse, may need higher dose of MiraLAX to treat. If this does not work she should contact us.   Ileene PatrickSteven Armbruster, MD [facility] Pager [phone]  CC: Corwin LevinsJohn, James W, MD

## 2016-04-19 ENCOUNTER — Inpatient Hospital Stay (HOSPITAL_COMMUNITY)
Admission: EM | Admit: 2016-04-19 | Discharge: 2016-04-22 | DRG: 378 | Disposition: A | Discharge status: Home Health | Payer: Medicare Other | Attending: Family Medicine | Admitting: Family Medicine

## 2016-04-19 ENCOUNTER — Inpatient Hospital Stay (HOSPITAL_COMMUNITY): Payer: Medicare Other

## 2016-04-19 ENCOUNTER — Encounter (HOSPITAL_COMMUNITY): Payer: Self-pay | Admitting: Emergency Medicine

## 2016-04-19 DIAGNOSIS — E785 Hyperlipidemia, unspecified: Secondary | ICD-10-CM | POA: Diagnosis not present

## 2016-04-19 DIAGNOSIS — Z66 Do not resuscitate: Secondary | ICD-10-CM | POA: Diagnosis not present

## 2016-04-19 DIAGNOSIS — I110 Hypertensive heart disease with heart failure: Secondary | ICD-10-CM | POA: Diagnosis not present

## 2016-04-19 DIAGNOSIS — R000 Tachycardia, unspecified: Secondary | ICD-10-CM | POA: Diagnosis not present

## 2016-04-19 DIAGNOSIS — Z800 Family history of malignant neoplasm of digestive organs: Secondary | ICD-10-CM

## 2016-04-19 DIAGNOSIS — Z833 Family history of diabetes mellitus: Secondary | ICD-10-CM

## 2016-04-19 DIAGNOSIS — Z7982 Long term (current) use of aspirin: Secondary | ICD-10-CM

## 2016-04-19 DIAGNOSIS — K219 Gastro-esophageal reflux disease without esophagitis: Secondary | ICD-10-CM | POA: Diagnosis not present

## 2016-04-19 DIAGNOSIS — Z8673 Personal history of transient ischemic attack (TIA), and cerebral infarction without residual deficits: Secondary | ICD-10-CM | POA: Diagnosis not present

## 2016-04-19 DIAGNOSIS — I5030 Unspecified diastolic (congestive) heart failure: Secondary | ICD-10-CM | POA: Diagnosis present

## 2016-04-19 DIAGNOSIS — R791 Abnormal coagulation profile: Secondary | ICD-10-CM | POA: Diagnosis present

## 2016-04-19 DIAGNOSIS — I272 Pulmonary hypertension, unspecified: Secondary | ICD-10-CM | POA: Diagnosis present

## 2016-04-19 DIAGNOSIS — Z87891 Personal history of nicotine dependence: Secondary | ICD-10-CM

## 2016-04-19 DIAGNOSIS — E1151 Type 2 diabetes mellitus with diabetic peripheral angiopathy without gangrene: Secondary | ICD-10-CM | POA: Diagnosis present

## 2016-04-19 DIAGNOSIS — K625 Hemorrhage of anus and rectum: Secondary | ICD-10-CM | POA: Diagnosis present

## 2016-04-19 DIAGNOSIS — K922 Gastrointestinal hemorrhage, unspecified: Secondary | ICD-10-CM | POA: Diagnosis not present

## 2016-04-19 DIAGNOSIS — I70209 Unspecified atherosclerosis of native arteries of extremities, unspecified extremity: Secondary | ICD-10-CM | POA: Diagnosis present

## 2016-04-19 DIAGNOSIS — I10 Essential (primary) hypertension: Secondary | ICD-10-CM | POA: Diagnosis not present

## 2016-04-19 DIAGNOSIS — I4891 Unspecified atrial fibrillation: Secondary | ICD-10-CM | POA: Diagnosis not present

## 2016-04-19 DIAGNOSIS — Z86010 Personal history of colonic polyps: Secondary | ICD-10-CM | POA: Diagnosis not present

## 2016-04-19 DIAGNOSIS — K5731 Diverticulosis of large intestine without perforation or abscess with bleeding: Secondary | ICD-10-CM | POA: Diagnosis not present

## 2016-04-19 DIAGNOSIS — Z79899 Other long term (current) drug therapy: Secondary | ICD-10-CM | POA: Diagnosis not present

## 2016-04-19 DIAGNOSIS — D62 Acute posthemorrhagic anemia: Secondary | ICD-10-CM | POA: Diagnosis present

## 2016-04-19 DIAGNOSIS — Z8711 Personal history of peptic ulcer disease: Secondary | ICD-10-CM

## 2016-04-19 DIAGNOSIS — Z7901 Long term (current) use of anticoagulants: Secondary | ICD-10-CM | POA: Diagnosis not present

## 2016-04-19 LAB — PREPARE FRESH FROZEN PLASMA
UNIT DIVISION: 0
Unit division: 0

## 2016-04-19 LAB — CBC
HCT: 20.4 % — ABNORMAL LOW (ref 36.0–46.0)
HEMATOCRIT: 24.9 % — AB (ref 36.0–46.0)
HEMATOCRIT: 30.8 % — AB (ref 36.0–46.0)
HEMOGLOBIN: 10.6 g/dL — AB (ref 12.0–15.0)
Hemoglobin: 7.8 g/dL — ABNORMAL LOW (ref 12.0–15.0)
Hemoglobin: 6.9 g/dL — CL (ref 12.0–15.0)
MCH: 27.4 pg (ref 26.0–34.0)
MCH: 29.1 pg (ref 26.0–34.0)
MCH: 29.4 pg (ref 26.0–34.0)
MCHC: 31.3 g/dL (ref 30.0–36.0)
MCHC: 33.8 g/dL (ref 30.0–36.0)
MCHC: 34.4 g/dL (ref 30.0–36.0)
MCV: 87.4 fL (ref 78.0–100.0)
MCV: 86.1 fL (ref 78.0–100.0)
MCV: 85.3 fL (ref 78.0–100.0)
PLATELETS: 417 10*3/uL — AB (ref 150–400)
PLATELETS: 208 10*3/uL (ref 150–400)
Platelets: 208 10*3/uL (ref 150–400)
RBC: 2.85 MIL/uL — ABNORMAL LOW (ref 3.87–5.11)
RBC: 2.37 MIL/uL — AB (ref 3.87–5.11)
RBC: 3.61 MIL/uL — AB (ref 3.87–5.11)
RDW: 17.3 % — AB (ref 11.5–15.5)
RDW: 15.8 % — ABNORMAL HIGH (ref 11.5–15.5)
RDW: 14.8 % (ref 11.5–15.5)
WBC: 12.6 10*3/uL — AB (ref 4.0–10.5)
WBC: 13 10*3/uL — ABNORMAL HIGH (ref 4.0–10.5)
WBC: 15.3 10*3/uL — AB (ref 4.0–10.5)

## 2016-04-19 LAB — COMPREHENSIVE METABOLIC PANEL
ALBUMIN: 2.1 g/dL — AB (ref 3.5–5.0)
ALK PHOS: 58 U/L (ref 38–126)
ALT: 14 U/L (ref 14–54)
AST: 21 U/L (ref 15–41)
Anion gap: 9 (ref 5–15)
BILIRUBIN TOTAL: 0.2 mg/dL — AB (ref 0.3–1.2)
BUN: 13 mg/dL (ref 6–20)
CALCIUM: 8.4 mg/dL — AB (ref 8.9–10.3)
CO2: 25 mmol/L (ref 22–32)
CREATININE: 0.7 mg/dL (ref 0.44–1.00)
Chloride: 108 mmol/L (ref 101–111)
GFR calc Af Amer: >60 mL/min (ref >60)
GLUCOSE: 123 mg/dL — AB (ref 65–99)
POTASSIUM: 3.3 mmol/L — AB (ref 3.5–5.1)
Sodium: 142 mmol/L (ref 135–145)
TOTAL PROTEIN: 5 g/dL — AB (ref 6.5–8.1)

## 2016-04-19 LAB — I-STAT CHEM 8, ED
BUN: 16 mg/dL (ref 6–20)
Calcium, Ion: 1.18 mmol/L (ref 1.15–1.40)
Chloride: 107 mmol/L (ref 101–111)
Creatinine, Ser: 0.7 mg/dL (ref 0.44–1.00)
Glucose, Bld: 122 mg/dL — ABNORMAL HIGH (ref 65–99)
HEMATOCRIT: 21 % — AB (ref 36.0–46.0)
HEMOGLOBIN: 7.1 g/dL — AB (ref 12.0–15.0)
POTASSIUM: 3.5 mmol/L (ref 3.5–5.1)
SODIUM: 143 mmol/L (ref 135–145)
TCO2: 27 mmol/L (ref 0–100)

## 2016-04-19 LAB — PREPARE RBC (CROSSMATCH)

## 2016-04-19 LAB — PROTIME-INR
INR: 3.32
INR: 1.6
INR: 1.33
PROTHROMBIN TIME: 16.6 s — AB (ref 11.4–15.2)
Prothrombin Time: 34.4 seconds — ABNORMAL HIGH (ref 11.4–15.2)
Prothrombin Time: 19.2 seconds — ABNORMAL HIGH (ref 11.4–15.2)

## 2016-04-19 LAB — MRSA PCR SCREENING: MRSA by PCR: NEGATIVE

## 2016-04-19 LAB — BLOOD PRODUCT ORDER (VERBAL) VERIFICATION

## 2016-04-19 LAB — LACTIC ACID, PLASMA: Lactic Acid, Venous: 1.6 mmol/L (ref 0.5–1.9)

## 2016-04-19 MED ORDER — FENOFIBRATE 160 MG PO TABS: 160.0000 mg | ORAL_TABLET | Freq: Every day | ORAL | Status: DC

## 2016-04-19 MED ORDER — LORAZEPAM 2 MG/ML IJ SOLN
1.0000 mg | Freq: Once | INTRAMUSCULAR | Status: AC
  Administered 2016-04-19: 1 mg via INTRAVENOUS
  Filled 2016-04-19: qty 1

## 2016-04-19 MED ORDER — OLANZAPINE 2.5 MG PO TABS
2.5000 mg | ORAL_TABLET | Freq: Every day | ORAL | Status: DC
  Filled 2016-04-19: qty 1

## 2016-04-19 MED ORDER — SODIUM CHLORIDE 0.9 % IV BOLUS (SEPSIS)
1000.0000 mL | Freq: Once | INTRAVENOUS | Status: AC
  Administered 2016-04-19: 1000 mL via INTRAVENOUS

## 2016-04-19 MED ORDER — PROSIGHT PO TABS
1.0000 | ORAL_TABLET | Freq: Every day | ORAL | Status: DC
  Filled 2016-04-19: qty 1

## 2016-04-19 MED ORDER — ADULT MULTIVITAMIN W/MINERALS CH: 1.0000 | ORAL_TABLET | Freq: Every day | ORAL | Status: DC

## 2016-04-19 MED ORDER — PRESERVISION AREDS 2 PO CAPS: 1.0000 | ORAL_CAPSULE | Freq: Two times a day (BID) | ORAL | Status: DC

## 2016-04-19 MED ORDER — PANTOPRAZOLE SODIUM 40 MG IV SOLR
40.0000 mg | Freq: Two times a day (BID) | INTRAVENOUS | Status: DC
  Administered 2016-04-19 – 2016-04-21 (×5): 40 mg via INTRAVENOUS
  Filled 2016-04-19 (×5): qty 40

## 2016-04-19 MED ORDER — ACETAMINOPHEN 325 MG PO TABS: 650.0000 mg | ORAL_TABLET | Freq: Once | ORAL | Status: DC

## 2016-04-19 MED ORDER — SODIUM CHLORIDE 0.9 % IV SOLN: 10.0000 mL/h | Freq: Once | INTRAVENOUS | Status: DC

## 2016-04-19 MED ORDER — PANTOPRAZOLE SODIUM 40 MG PO TBEC: 40.0000 mg | DELAYED_RELEASE_TABLET | Freq: Two times a day (BID) | ORAL | Status: DC

## 2016-04-19 MED ORDER — PENTOXIFYLLINE ER 400 MG PO TBCR
400.0000 mg | EXTENDED_RELEASE_TABLET | Freq: Every day | ORAL | Status: DC
  Filled 2016-04-19: qty 1

## 2016-04-19 MED ORDER — DILTIAZEM LOAD VIA INFUSION
10.0000 mg | Freq: Once | INTRAVENOUS | Status: AC
  Administered 2016-04-19: 10 mg via INTRAVENOUS
  Filled 2016-04-19: qty 10

## 2016-04-19 MED ORDER — ORAL CARE MOUTH RINSE
15.0000 mL | Freq: Two times a day (BID) | OROMUCOSAL | Status: DC
  Administered 2016-04-19 (×2): 15 mL via OROMUCOSAL

## 2016-04-19 MED ORDER — VITAMIN K1 10 MG/ML IJ SOLN
10.0000 mg | Freq: Once | INTRAVENOUS | Status: AC
  Administered 2016-04-19: 10 mg via INTRAVENOUS
  Filled 2016-04-19: qty 1

## 2016-04-19 MED ORDER — TECHNETIUM TC 99M-LABELED RED BLOOD CELLS IV KIT
25.0000 | PACK | Freq: Once | INTRAVENOUS | Status: AC | PRN
  Administered 2016-04-19: 25 via INTRAVENOUS

## 2016-04-19 MED ORDER — ATORVASTATIN CALCIUM 10 MG PO TABS: 5.0000 mg | ORAL_TABLET | Freq: Every day | ORAL | Status: DC

## 2016-04-19 MED ORDER — OXYBUTYNIN CHLORIDE ER 5 MG PO TB24
5.0000 mg | ORAL_TABLET | Freq: Every day | ORAL | Status: DC
  Administered 2016-04-20 – 2016-04-22 (×3): 5 mg via ORAL
  Filled 2016-04-19 (×3): qty 1

## 2016-04-19 MED ORDER — DONEPEZIL HCL 5 MG PO TABS
5.0000 mg | ORAL_TABLET | Freq: Every day | ORAL | Status: DC
  Administered 2016-04-19 – 2016-04-21 (×3): 5 mg via ORAL
  Filled 2016-04-19 (×3): qty 1

## 2016-04-19 MED ORDER — DILTIAZEM HCL 100 MG IV SOLR
5.0000 mg/h | INTRAVENOUS | Status: AC
  Administered 2016-04-19: 10 mg/h via INTRAVENOUS
  Administered 2016-04-20: 15 mg/h via INTRAVENOUS
  Filled 2016-04-19 (×3): qty 100

## 2016-04-19 NOTE — ED Notes (Signed)
Consent obtained for blood transfusion.  

## 2016-04-19 NOTE — ED Notes (Addendum)
Incontinent of another large amount of blood and clots.  Incontinence care given.  Tolerating blood and FFP well so far.  Daughter at the bedside.  Encouraged to call for assistance as needed.

## 2016-04-19 NOTE — ED Notes (Signed)
Pt still receiving blood.

## 2016-04-19 NOTE — ED Notes (Signed)
Gardner DO at bedside 

## 2016-04-19 NOTE — Progress Notes (Signed)
Chief Complaint: Patient was seen in consultation today for  Chief Complaint  Patient presents with  . GI Bleeding   at the request of Dr. Osvaldo Shipper  Referring Physician(s): Dr. Osvaldo Shipper  Supervising Physician: Irish Lack  Patient Status: [facility] - In-pt  History of Present Illness: Tracy [name] is a 83 y.o. female who has been admitted with GI bleed. Hx of Afib and CVA on chronic Coumadin. Had Upper GI bleed a few months ago. This resolved and she recovered and Coumadin was restarted. Has now had a few days of abd pain and BRBPR.  NM RBC scan this am was positive. IR is asked to eval for angiogram/embolization. Chart, imaging, labs reviewed. Last bloody BM was this am about 0900, RN says was bright red blood. Checked at bedside during this exam, small amount of dark blood/mostly clot. INR upon admission was 3.3, has been aggressively reversed with FFP and Vit K, last INR was 1.6. Hgb remains low at 6.9, 2 additional PRBC have been ordered.  Past Medical History:  Diagnosis Date  . Atrial fibrillation (HCC)   . CAROTID STENOSIS   . CVA (cerebrovascular accident) (HCC) 2002  . DIABETES MELLITUS, TYPE II   . DIVERTICULOSIS, COLON   . DM (diabetes mellitus), type 2 (HCC)   . DVT (deep venous thrombosis) (HCC)   . Gastric ulcer   . GERD   . HTN (hypertension)   . Hx of adenomatous colonic polyps 2007  . Hyperlipidemia   . Hypertension   . OSTEOPENIA   . PVD (peripheral vascular disease) (HCC)    recently dx w/ carotid -left  int carotid >90% 50-60% rt int carotid 02/2010  . PVD (peripheral vascular disease) (HCC)    lf  ABI 0.11 and rt 0.43  . Sinus node dysfunction (HCC) 2011  . Stroke [facility]) July 01, 2014   Affected Left side  . Upper GI bleed 02/2016    Past Surgical History:  Procedure Laterality Date  . CAROTID ENDARTERECTOMY    . cataract eye surgery    . CNS vascular stent per Neurosurgeon    . ESOPHAGOGASTRODUODENOSCOPY (EGD) WITH  PROPOFOL N/A 02/24/2016   Procedure: ESOPHAGOGASTRODUODENOSCOPY (EGD) WITH PROPOFOL;  Surgeon: Kathi Der, MD;  Location: [facility] ENDOSCOPY;  Service: Gastroenterology;  Laterality: N/A;  . EYE SURGERY    . ILIAC ARTERY STENT  2011   Bilateral CIA stent  . lazer eye surgery    . RADIOLOGY WITH ANESTHESIA N/A 03/20/2016   Procedure: MRI OF BRAIN;  Surgeon: Medication Radiologist, MD;  Location: [facility] OR;  Service: Radiology;  Laterality: N/A;  . Right Shoulder Surgery  2010  . TUBAL LIGATION      Allergies: Patient has no known allergies.  Medications:  Current Facility-Administered Medications:  .  0.9 %  sodium chloride infusion, 10 mL/hr, Intravenous, Once, Mayer Masker Horton, MD .  0.9 %  sodium chloride infusion, 10 mL/hr, Intravenous, Once, [name], MD .  acetaminophen (TYLENOL) tablet 650 mg, 650 mg, Oral, Once, Dianah Field, PA-C .  [COMPLETED] diltiazem (CARDIZEM) 1 mg/mL load via infusion 10 mg, 10 mg, Intravenous, Once, 10 mg at 04/19/16 1000 **AND** diltiazem (CARDIZEM) 100 mg in dextrose 5 % 100 mL (1 mg/mL) infusion, 5-15 mg/hr, Intravenous, Continuous, Osvaldo Shipper, MD, Last Rate: 5 mL/hr at 04/19/16 1001, 5 mg/hr at 04/19/16 1001 .  donepezil (ARICEPT) tablet 5 mg, 5 mg, Oral, QHS, Jared M Gardner, DO .  oxybutynin (DITROPAN-XL) 24 hr tablet  5 mg, 5 mg, Oral, Daily, Osvaldo Shipper, MD .  pantoprazole (PROTONIX) injection 40 mg, 40 mg, Intravenous, Q12H, Osvaldo Shipper, MD    Family History  Problem Relation Age of Onset  . Colon cancer Mother   . Diabetes Sister     Amputation    Social History   Social History  . Marital status: Widowed    Spouse name: N/A  . Number of children: 5  . Years of education: N/A   Occupational History  . retired Ship broker   Social History Main Topics  . Smoking status: Former Games developer  . Smokeless tobacco: Never Used     Comment: QUIT IN 51  . Alcohol use No  . Drug use: No  . Sexual activity: Not Asked   Other  Topics Concern  . None   Social History Narrative   ** Merged History Encounter **         Review of Systems  Reason unable to perform ROS: Pt not alert enough to perform ROS. No family at bedside. ROS/Hx taken from chart.    Vital Signs: BP 98/68   Pulse 83   Temp 97.4 F (36.3 C) (Oral)   Resp (!) 22   Ht 4\' 11"  (1.499 m)   Wt 97 lb 14.2 oz (44.4 kg)   SpO2 96%   BMI 19.77 kg/m   Physical Exam  Constitutional: She appears well-developed. No distress.  HENT:  Head: Normocephalic.  Mouth/Throat: Oropharynx is clear and moist.  Neck: Normal range of motion. No tracheal deviation present.  Cardiovascular:  Irregular rate and rhythm. Difficult to palpate femoral pulses due to calcified vessels and rhythm. Feet warm  Pulmonary/Chest: Effort normal. No respiratory distress.  Abdominal: Soft. She exhibits no distension. There is no tenderness. There is no guarding.  Skin: Skin is warm and dry.  Psychiatric:  Pt awakens to name but unable to engage.    Mallampati Score:  MD Evaluation Airway: WNL Heart: WNL Abdomen: WNL Chest/ Lungs: WNL ASA  Classification: 3 Mallampati/Airway Score: Two  Imaging: Nm Gi Blood Loss  Result Date: 04/19/2016 CLINICAL DATA:  Upper GI bleed, diverticulosis, on Coumadin, abdominal pain EXAM: NUCLEAR MEDICINE GASTROINTESTINAL BLEEDING SCAN TECHNIQUE: Sequential abdominal images were obtained following intravenous administration of Tc-23m labeled red blood cells. RADIOPHARMACEUTICALS:  25.8 mCi Tc-1m in-vitro labeled red cells. COMPARISON:  CT abdomen/ pelvis dated 03/15/2016 FINDINGS: Within 5 minutes, there is increased radiotracer within the splenic flexure of the colon, indicating active GI bleeding. There is propagation into the descending colon during the course of the study, along with retrograde flow along the transverse colon at 55-60 minutes. The study was aborted at 60 minutes. IMPRESSION: Active GI bleeding localized to the  splenic flexure of the colon. These results were called by telephone at the time of interpretation on 04/19/2016 at 9:19 am to Dr. Irish Lack, who verbally acknowledged these results. Electronically Signed   By: Charline Bills M.D.   On: 04/19/2016 09:19   [employer]  Result Date: 04/01/2016 Procedure: Real-time Ultrasound Guided Injection of right glenohumeral joint Device: GE Logiq E Ultrasound guided injection is preferred based studies that show increased duration, increased effect, greater accuracy, decreased procedural pain, increased response rate with ultrasound guided versus blind injection. Verbal informed consent obtained. Time-out conducted. Noted no overlying erythema, induration, or other signs of local infection. Skin prepped in a sterile fashion. Local anesthesia: Topical Ethyl chloride. With sterile technique and under real time ultrasound guidance:  Joint  visualized.  23g 1  inch needle inserted posterior approach. Pictures taken for needle placement. Patient did have injection of 2 cc of 1% lidocaine, 2 cc of 0.5% Marcaine, and 1.0 cc of Kenalog 40 mg/dL. Completed without difficulty Pain immediately resolved suggesting accurate placement of the medication. Advised to call if fevers/chills, erythema, induration, drainage, or persistent bleeding. Images permanently stored and available for review in the ultrasound unit. Impression: Technically successful ultrasound guided injection.   Procedure: Real-time Ultrasound Guided Injection of left glenohumeral joint Device: GE Logiq E Ultrasound guided injection is preferred based studies that show increased duration, increased effect, greater accuracy, decreased procedural pain, increased response rate with ultrasound guided versus blind injection. Verbal informed consent obtained. Time-out conducted. Noted no overlying erythema, induration, or other signs of local infection. Skin prepped in a sterile fashion. Local anesthesia:  Topical Ethyl chloride. With sterile technique and under real time ultrasound guidance:  Joint visualized.  23g 1  inch needle inserted posterior approach. Pictures taken for needle placement. Patient did have injection of 2 cc of 1% lidocaine, 2 cc of 0.5% Marcaine, and 1cc of Kenalog 40 mg/dL. Completed without difficulty Pain immediately resolved suggesting accurate placement of the medication. Advised to call if fevers/chills, erythema, induration, drainage, or persistent bleeding. Images permanently stored and available for review in the ultrasound unit. Impression: Technically successful ultrasound guided injection   Labs:  CBC:  Recent Labs  03/18/16 0415 04/16/16 1546 04/19/16 0112 04/19/16 0116 04/19/16 0933  WBC 11.5* 12.9* 12.6*  --  13.0*  HGB 9.6* 9.3* 7.8* 7.1* 6.9*  HCT 30.7* 28.4* 24.9* 21.0* 20.4*  PLT 369 390.0 417*  --  208    COAGS:  Recent Labs  03/18/16 0415 04/13/16 04/19/16 0132 04/19/16 0933  INR 1.29 3.3 3.32 1.60    BMP:  Recent Labs  03/16/16 0604 03/17/16 0223 03/18/16 0415 04/19/16 0112 04/19/16 0116  NA 138 138 137 142 143  K 3.8 3.0* 3.8 3.3* 3.5  CL 102 104 105 108 107  CO2 25 27 26 25   --   GLUCOSE 85 112* 103* 123* 122*  BUN 8 7 <5* 13 16  CALCIUM 8.5* 7.9* 7.7* 8.4*  --   CREATININE 1.02* 1.00 0.94 0.70 0.70  GFRNONAA 48* 50* 53* >60  --   GFRAA 56* 57* >60 >60  --     LIVER FUNCTION TESTS:  Recent Labs  03/11/16 1715 03/12/16 0548 03/18/16 0415 04/19/16 0112  BILITOT 1.2 0.6 0.5 0.2*  AST 60* 46* 29 21  ALT 32 34 24 14  ALKPHOS 41 43 42 58  PROT 5.8* 6.3* 5.2* 5.0*  ALBUMIN 2.9* 2.9* 2.3* 2.1*    TUMOR MARKERS: No results for input(s): AFPTM, CEA, CA199, CHROMGRNA in the last 8760 hours.  Assessment and Plan: Lower GI bleed, presumed from (L)sided colon per NM RBC scan Coagulopathy, almost completely corrected, INR down to 1.6 Small dark blood/clot BM may indicate bleeding is slowing or has ceased now that  INR corrected. Prior abd imaging reviewed. Pt is vasculopath with known PAD and prior hx of (B)CIA stents. Pt has heavily calcified vessels, which may make arteriogram technically difficult with increased risks of complications such as dissection, embolic event, and procedure failure. Hgb remains low at 6.9, pt to be given 2 units PRBC.  Had long discussion with son, Tracy [name], over the phone. Explained role of arteriogram and hopes for embolization. Explained above stated concerns/risks of arteriogram. Risks and Benefits discussed with the patient including, but  not limited to bleeding, infection, vascular injury or contrast induced renal failure. Also discussed that bleeding may cease on its own now that INR corrected. Offered 2 options: Proceed with angiogram now vs watch and wait to see if bleeding has/will stop now that INR corrected. A-gram could still be offered/recommended if pt becomes unstable or has another fresh red bloody BM. Son elects to watch and wait. Pt to receive 2 units PRBC. Will have CBC/INR rechecked after transfusion. Monitor for change in status and/or fresh bloody BM.    Thank you for this interesting consult.    A copy of this report was sent to the requesting provider on this date.  Electronically Signed: Brayton ElBRUNING, Remo Kirschenmann 04/19/2016, 11:36 AM   I spent a total of 40 minutes in face to face in clinical consultation, greater than 50% of which was counseling/coordinating care for GI bleed/mesenteric arteriogram

## 2016-04-19 NOTE — ED Notes (Signed)
Incontinent of large amount of blood/blood clots.  Incontinence care given.  Barrier cream applied to bottom.  Family at the bedside.

## 2016-04-19 NOTE — ED Notes (Signed)
Taken to Nuclear med at this time.  RN at the bedside.

## 2016-04-19 NOTE — Progress Notes (Addendum)
CRITICAL VALUE ALERT  Critical value received:  Hemoglobin = 6.9  Date of notification:  04/19/16   Time of notification:  10:45 am   Critical value read back:Yes.    Nurse who received alert:  Wilhelmina McardleLauren Majesty Stehlin, RN  MD notified (1st page):  Brett CanalesSara Gribbin, PA-C  Time of first page:  10:50am  MD notified (2nd page):  Time of second page:  Responding MD:  Brett CanalesSara Gribbin, PA-C  Time MD responded:  10:51am  Orders for additional transfusion forthcoming.

## 2016-04-19 NOTE — ED Provider Notes (Addendum)
[facility]-EMERGENCY DEPT Provider Note   CSN: [id number] Arrival date & time: 04/19/16  0054  By signing my name below, I, Vista Mink, attest that this documentation has been prepared under the direction and in the presence of [name], MD. Electronically signed, Vista Mink, ED Scribe. 04/19/16. 2:27 AM.   History   Chief Complaint Chief Complaint  Patient presents with  . GI Bleeding    HPI HPI Comments: Tracy [name] is a 83 y.o. female, with Hx of GI bleed, Diverticulitis, brought in by ambulance, who presents to the Emergency Department complaining of persistent intermittent abdominal pain for the past 3 days and associated hematochezia that started this evening. Per spouse, pt had 4 "lumpy" bowel movements with significant bleeding noted. She has had one episode since arriving to the ED. Pt states that her abdominal pain becomes 9/10 during most severe waves of pain. Pt started taking Coumadin approximately 10 days ago for her A-fib. Pt had a colonoscopy performed by Dr. Darcus Austin, s/p previous GI bleed approximately two months ago and found two ulcers. She had one esophageal ulcer at GE junction and one gastric ulcer. No nausea or vomiting.  Patient was seen by [facility] on January 12. Plan for repeat endoscopy. She has not had a colonoscopy recently. She does have a history of severe diverticulosis.   The history is provided by the patient. No language interpreter was used.    Past Medical History:  Diagnosis Date  . Atrial fibrillation (HCC)   . CAROTID STENOSIS   . CVA (cerebrovascular accident) (HCC) 2002  . DIABETES MELLITUS, TYPE II   . DIVERTICULOSIS, COLON   . DM (diabetes mellitus), type 2 (HCC)   . DVT (deep venous thrombosis) (HCC)   . Gastric ulcer   . GERD   . HTN (hypertension)   . Hx of adenomatous colonic polyps 2007  . Hyperlipidemia   . Hypertension   . OSTEOPENIA   . PVD (peripheral vascular disease) (HCC)    recently dx w/ carotid -left  int  carotid >90% 50-60% rt int carotid 02/2010  . PVD (peripheral vascular disease) (HCC)    lf  ABI 0.11 and rt 0.43  . Sinus node dysfunction (HCC) 2011  . Stroke [facility]) July 01, 2014   Affected Left side  . Upper GI bleed 02/2016   Patient Active Problem List   Diagnosis Date Noted  . Encounter for therapeutic drug monitoring 04/13/2016  . Dehydration   . Urine incontinence 03/11/2016  . Hypokalemia 03/11/2016  . Loss of appetite 03/11/2016  . Leukocytosis 03/11/2016  . AKI (acute kidney injury) (HCC) 03/11/2016  . Gastric ulcer 03/04/2016  . Acute blood loss anemia   . Chronic diastolic CHF (congestive heart failure) (HCC)   . Dilated cardiomyopathy (HCC)   . Chronic atrial fibrillation (HCC)   . Pulmonary hypertension   . Right lumbar radiculopathy 02/12/2016  . Superficial bruising of foot 02/12/2016  . Irregular heart beats 02/04/2016  . General weakness 02/04/2016  . Atrial fibrillation with RVR (HCC) 02/04/2016  . Anemia, iron deficiency 02/04/2016  . Hypercalcemia 02/04/2016  . Acute kidney injury (HCC) 02/04/2016  . Right sided sciatica 11/18/2015  . Trigger point of right shoulder region 05/13/2015  . Cerebrovascular accident (CVA) due to thrombosis of right posterior cerebral artery (HCC) 04/17/2015  . PVD (peripheral vascular disease) (HCC) 04/17/2015  . Overactive bladder 11/15/2014  . Weakness 10/24/2014  . Cerebral infarction due to thrombosis of right middle cerebral artery (HCC) 10/17/2014  .  Monoplegia of left lower extremity (HCC) 08/16/2014  . Gait disturbance, post-stroke 08/16/2014  . Altered sensation due to stroke (HCC) 07/15/2014  . Hip pain, acute   . Bradycardia 07/01/2014  . Elevated troponin 07/01/2014  . Dysuria 07/01/2014  . Constipation 07/01/2014  . Right ankle pain 01/29/2014  . Rotator cuff tear arthropathy of right shoulder 06/19/2013  . Left rotator cuff tear arthropathy 05/17/2013  . Aneurysm of iliac artery (HCC) 01/26/2012  .  Peripheral vascular disease (HCC) 01/26/2012  . Left shoulder pain 02/09/2011  . Preventative health care 08/02/2010  . Sinus bradycardia 05/29/2010  . SICK SINUS SYNDROME 03/10/2010  . Occlusion and stenosis of carotid artery 03/10/2010  . PVD WITH CLAUDICATION 02/23/2010  . NECK MASS 10/01/2008  . DIVERTICULOSIS, COLON 08/01/2007  . SHOULDER PAIN, RIGHT 08/01/2007  . History of colonic polyps 08/01/2007  . GERD 12/25/2006  . Disorder of bone and cartilage 12/25/2006  . Hyperlipidemia 11/29/2006  . Essential hypertension 11/29/2006    Past Surgical History:  Procedure Laterality Date  . CAROTID ENDARTERECTOMY    . cataract eye surgery    . CNS vascular stent per Neurosurgeon    . ESOPHAGOGASTRODUODENOSCOPY (EGD) WITH PROPOFOL N/A 02/24/2016   Procedure: ESOPHAGOGASTRODUODENOSCOPY (EGD) WITH PROPOFOL;  Surgeon: Kathi DerParag Brahmbhatt, MD;  Location: [facility] ENDOSCOPY;  Service: Gastroenterology;  Laterality: N/A;  . EYE SURGERY    . ILIAC ARTERY STENT  2011   Bilateral CIA stent  . lazer eye surgery    . RADIOLOGY WITH ANESTHESIA N/A 03/20/2016   Procedure: MRI OF BRAIN;  Surgeon: Medication Radiologist, MD;  Location: [facility] OR;  Service: Radiology;  Laterality: N/A;  . Right Shoulder Surgery  2010  . TUBAL LIGATION      OB History    Gravida Para Term Preterm AB Living   0 0 0 0 0     SAB TAB Ectopic Multiple Live Births   0 0 0           Home Medications    Prior to Admission medications   Medication Sig Start Date End Date Taking? Authorizing Provider  amLODipine (NORVASC) 5 MG tablet Take 5 mg by mouth daily. 04/18/16  Yes Historical Provider, MD  aspirin 325 MG tablet Take 325 mg by mouth daily.   Yes Historical Provider, MD  atorvastatin (LIPITOR) 10 MG tablet 1/2 tab by mouth daily Patient taking differently: Take 5 mg by mouth daily.  11/18/15  Yes Corwin LevinsJames W John, MD  diltiazem (CARDIZEM CD) 180 MG 24 hr capsule Take 1 capsule (180 mg total) by mouth daily. 02/26/16  Yes  Lonia BloodJeffrey T McClung, MD  docusate sodium (COLACE) 100 MG capsule Take 100 mg by mouth daily.   Yes Historical Provider, MD  donepezil (ARICEPT) 5 MG tablet Take 1 tablet (5 mg total) by mouth at bedtime. 03/22/16  Yes Costin Otelia SergeantM Gherghe, MD  feeding supplement, ENSURE ENLIVE, (ENSURE ENLIVE) LIQD Take 237 mLs by mouth 2 (two) times daily between meals. 02/07/16  Yes Narda Bondsalph A Nettey, MD  fenofibrate micronized (LOFIBRA) 134 MG capsule TAKE 1 CAPSULE (134 MG TOTAL) BY MOUTH DAILY. 04/17/15  Yes Corwin LevinsJames W John, MD  metoprolol tartrate (LOPRESSOR) 25 MG tablet Take 0.5 tablets (12.5 mg total) by mouth 2 (two) times daily. 03/22/16  Yes Costin Otelia SergeantM Gherghe, MD  Multiple Vitamin (MULTIVITAMIN WITH MINERALS) TABS tablet Take 1 tablet by mouth daily.   Yes Historical Provider, MD  Multiple Vitamins-Minerals (PRESERVISION AREDS 2) CAPS Take 1 capsule by mouth 2 (  two) times daily.   Yes Historical Provider, MD  OLANZapine (ZYPREXA) 2.5 MG tablet Take 2.5 mg by mouth daily. 04/10/16  Yes Historical Provider, MD  oxybutynin (DITROPAN-XL) 5 MG 24 hr tablet TAKE 1 TABLET BY MOUTH EVERY DAY 10/27/15  Yes Corwin Levins, MD  pantoprazole (PROTONIX) 40 MG tablet Take 1 tablet (40 mg total) by mouth 2 (two) times daily before a meal. 02/26/16  Yes Lonia Blood, MD  pentoxifylline (TRENTAL) 400 MG CR tablet TAKE 1 TABLET (400 MG TOTAL) BY MOUTH DAILY. 04/17/15  Yes Corwin Levins, MD  polyethylene glycol powder (GLYCOLAX/MIRALAX) powder Mix 17g of Miralax in 8oz of water twice daily. 04/16/16  Yes Ruffin Frederick, MD  warfarin (COUMADIN) 2.5 MG tablet Take 2.5 mg by mouth daily. 04/10/16  Yes Historical Provider, MD    Family History Family History  Problem Relation Age of Onset  . Colon cancer Mother   . Diabetes Sister     Amputation    Social History Social History  Substance Use Topics  . Smoking status: Former Games developer  . Smokeless tobacco: Never Used     Comment: QUIT IN 39  . Alcohol use No     Allergies    Patient has no known allergies.   Review of Systems Review of Systems  Constitutional: Negative for fever.  Respiratory: Negative for shortness of breath.   Cardiovascular: Negative for chest pain.  Gastrointestinal: Positive for abdominal pain and blood in stool. Negative for nausea and vomiting.  Genitourinary: Negative for dysuria and hematuria.  All other systems reviewed and are negative.    Physical Exam Updated Vital Signs BP 138/73   Pulse 118   Temp 98.1 F (36.7 C) (Oral)   Resp 23   Ht 4\' 11"  (1.499 m)   Wt 96 lb (43.5 kg)   SpO2 100%   BMI 19.39 kg/m   Physical Exam  Constitutional: She is oriented to person, place, and time. No distress.  Frail, elderly, no acute distress  HENT:  Head: Normocephalic and atraumatic.  Cardiovascular: Normal heart sounds.   Tachycardia, irregular rhythm  Pulmonary/Chest: Effort normal and breath sounds normal. No respiratory distress. She has no wheezes.  Abdominal: Soft. Bowel sounds are normal. She exhibits no mass. There is tenderness. There is no guarding.  Diffuse tenderness to palpation without rebound or guarding  Genitourinary:  Genitourinary Comments: Grossly bloody stools with multiple clots noted  Neurological: She is alert and oriented to person, place, and time.  Skin: Skin is warm and dry.  Psychiatric: She has a normal mood and affect.  Nursing note and vitals reviewed.    ED Treatments / Results  DIAGNOSTIC STUDIES: Oxygen Saturation is 98% on RA, normal by my interpretation.  COORDINATION OF CARE: 1:29 AM-Discussed treatment plan with pt at bedside and pt agreed to plan.   Labs (all labs ordered are listed, but only abnormal results are displayed) Labs Reviewed  COMPREHENSIVE METABOLIC PANEL - Abnormal; Notable for the following:       Result Value   Potassium 3.3 (*)    Glucose, Bld 123 (*)    Calcium 8.4 (*)    Total Protein 5.0 (*)    Albumin 2.1 (*)    Total Bilirubin 0.2 (*)    All  other components within normal limits  CBC - Abnormal; Notable for the following:    WBC 12.6 (*)    RBC 2.85 (*)    Hemoglobin 7.8 (*)    HCT  24.9 (*)    RDW 17.3 (*)    Platelets 417 (*)    All other components within normal limits  PROTIME-INR - Abnormal; Notable for the following:    Prothrombin Time 34.4 (*)    All other components within normal limits  I-STAT CHEM 8, ED - Abnormal; Notable for the following:    Glucose, Bld 122 (*)    Hemoglobin 7.1 (*)    HCT 21.0 (*)    All other components within normal limits  POC OCCULT BLOOD, ED  TYPE AND SCREEN  PREPARE FRESH FROZEN PLASMA  PREPARE RBC (CROSSMATCH)  PREPARE FRESH FROZEN PLASMA    EKG  EKG Interpretation  Date/Time:  Monday April 19 2016 01:53:49 EST Ventricular Rate:  122 PR Interval:    QRS Duration: 105 QT Interval:  334 QTC Calculation: 476 R Axis:   -22 Text Interpretation:  Atrial fibrillation LVH with secondary repolarization abnormality Probable anterior infarct, age indeterminate No significant change since last tracing Confirmed by Nickalos Petersen  MD, Toni Amend ([id number]) on 04/19/2016 2:25:37 AM       Radiology No results found.  Procedures Procedures (including critical care time)  CRITICAL CARE Performed by: [name]   Total critical care time: 40 minutes  Critical care time was exclusive of separately billable procedures and treating other patients.  Critical care was necessary to treat or prevent imminent or life-threatening deterioration.  Critical care was time spent personally by me on the following activities: development of treatment plan with patient and/or surrogate as well as nursing, discussions with consultants, evaluation of patient's response to treatment, examination of patient, obtaining history from patient or surrogate, ordering and performing treatments and interventions, ordering and review of laboratory studies, ordering and review of radiographic studies, pulse  oximetry and re-evaluation of patient's condition.   Medications Ordered in ED Medications  0.9 %  sodium chloride infusion (not administered)  0.9 %  sodium chloride infusion (not administered)  phytonadione (VITAMIN K) 10 mg in dextrose 5 % 50 mL IVPB (not administered)  sodium chloride 0.9 % bolus 1,000 mL (0 mLs Intravenous Stopped 04/19/16 0210)     Initial Impression / Assessment and Plan / ED Course  I have reviewed the triage vital signs and the nursing notes.  Pertinent labs & imaging results that were available during my care of the patient were reviewed by me and considered in my medical decision making (see chart for details).  Clinical Course     Patient presents with acute GI bleed. Recent history of upper GI bleed. Just recently started on her Coumadin. She is tachycardic in atrial fibrillation. Blood pressure is stable. She is having active bleeding on exam with passage of gross blood and clots. She is given 1 L of fluids. She is typed and screened. Initial hemoglobin 7.8. Down from recent baseline of 9. Given ongoing bleeding, 2 units of blood were ordered for transfusion. INR is 3.3.  Patient was given IV vitamin K and an additional 2 units of FFP was ordered. Patient has a normal EF based on last echocardiogram. Discussed with Dr. Russella Dar, GI. Plan for admission to the stepdown unit and evaluation first thing in the morning. If patient decompensates or becomes hypotensive he should be immediately consulted. Will plan to admit to the hospitalist.  2:57 AM Discussed with the admitting hospitalist. Will admit to stepdown unit. He is requesting order for case central. I have requested that he evaluate the patient given that her blood pressure is stable  prior to this order being placed.  Final Clinical Impressions(s) / ED Diagnoses   Final diagnoses:  Acute GI bleeding    New Prescriptions New Prescriptions   No medications on file   I personally performed the services  described in this documentation, which was scribed in my presence. The recorded information has been reviewed and is accurate.     [name], MD 04/19/16 0454    [name], MD 04/19/16 [phone]

## 2016-04-19 NOTE — ED Notes (Signed)
Lauren, RN on 2S made aware that patient will be coming up to the floor in the next few minutes.

## 2016-04-19 NOTE — Consult Note (Signed)
[facility] Consult: 8:17 AM 04/19/2016  LOS: 0 days    Referring Provider: Dr Barnie DelG Krishnan  Primary Care Physician:  Oliver BarreJames John, MD Primary Gastroenterologist:  Seen by Deboraha SprangEagle 02/2016, 03/2016 then by Dr Adela LankArmbruster 04/16/16. Hurman HornSun, Tracy [name]. [phone] Daughter Tracy MessierKathy [phone]    Reason for Consultation:  Passing blood, hematochezia.     HPI: Tracy FinerOphelia W [name] is a 83 y.o. female.  PMH severe pulmonary htn.  DM2.  HTN.  Diastolic CHF.  Deconditioning.  A.Fib.  CVA.  On chronic AC, switched from Eliquis to Coumadin and PLavix discontinued due to intermittent GI bleeds starting ~ 5 /2016.  In 02/2015  Eliquis and all AC stopped, switched to full dose ASA after UGIB.  However within the last couple of weeks, Dr. at [facility] restarted Coumadin because she was having swelling in her legs. However studies did not confirm any diagnosis of DVT.  02/2016 S/P PRBC x 3, Hgb nadir 5.1.  ASPVD, s/p iliac stents.  Ischemic changes on brain CT.   02/23/16 EGD for IDA in setting of Eliquis found 2.45 mm gastric ulcer, ulcer at GEJx and large HH.  Gastric biopsy: reactive gastropathy, no H Pylori. Colonoscopy 11/28/09 - severe diverticulosis, rectal prolapse with stercoral ulcer EGD 10/01/05 - 5cm HH, gastritis Colonoscopy 10/01/05 - small polyps, diverticulosis, hemorrhoids CT scan Abd/Pelvis 03/15/16: sigmoid tics, aortic atherosclerosis, iliac stents.  Cholelithiasis.    Patient was restarted on Coumadin within the last 10-14 days because of lower extremity edema. Her son tells me that studies did not confirm any DVT but that Dr. Kevan NyGates, at the nursing home, felt that she should be restarted on her Coumadin. Patient was discharged from SNF and returned home on Saturday, 04/17/16. In ED now with 3 days abd pain, it started  on Friday and persisted through Saturday. By Sunday the pain was better. Sunday morning she passed a small amount of blood with the stool., Had 2 more brown stools that did not show any blood. The family debated about Coumadin and decided to give her half of her usual Coumadin dose last night at 7:30 PM. Within an hour or so she started passing a grossly bloody stool that only had a small amount of BM with it.  She's had a few episodes since then, the latest was at about 845 this morning.   Hgb 7.8, down from 9.3 last week. INR 3.3.   S/P PRBC x 2 and FFP x 2 and 10 mg Vitamin K.   Nuclear medicine bleeding scan shows localized active bleeding at the splenic flexure of the colon. Patient's A. fib has been erratic with rates as low as in the 50s and as high as in the 140s.  Systolic blood pressures in the 1 teens - 130's  The patient's son and daughter provide history that when she had her bleeding from ulcer in November 2017, she was vomiting blood but not passing any red blood. She wasn't having a whole lot of abdominal pain at that time.     Past  Medical History:  Diagnosis Date  . Atrial fibrillation (HCC)   . CAROTID STENOSIS   . CVA (cerebrovascular accident) (HCC) 2002  . DIABETES MELLITUS, TYPE II   . DIVERTICULOSIS, COLON   . DM (diabetes mellitus), type 2 (HCC)   . DVT (deep venous thrombosis) (HCC)   . Gastric ulcer   . GERD   . HTN (hypertension)   . Hx of adenomatous colonic polyps 2007  . Hyperlipidemia   . Hypertension   . OSTEOPENIA   . PVD (peripheral vascular disease) (HCC)    recently dx w/ carotid -left  int carotid >90% 50-60% rt int carotid 02/2010  . PVD (peripheral vascular disease) (HCC)    lf  ABI 0.11 and rt 0.43  . Sinus node dysfunction (HCC) 2011  . Stroke [facility]) July 01, 2014   Affected Left side  . Upper GI bleed 02/2016    Past Surgical History:  Procedure Laterality Date  . CAROTID ENDARTERECTOMY    . cataract eye surgery    . CNS vascular  stent per Neurosurgeon    . ESOPHAGOGASTRODUODENOSCOPY (EGD) WITH PROPOFOL N/A 02/24/2016   Procedure: ESOPHAGOGASTRODUODENOSCOPY (EGD) WITH PROPOFOL;  Surgeon: Kathi Der, MD;  Location: [facility] ENDOSCOPY;  Service: Gastroenterology;  Laterality: N/A;  . EYE SURGERY    . ILIAC ARTERY STENT  2011   Bilateral CIA stent  . lazer eye surgery    . RADIOLOGY WITH ANESTHESIA N/A 03/20/2016   Procedure: MRI OF BRAIN;  Surgeon: Medication Radiologist, MD;  Location: [facility] OR;  Service: Radiology;  Laterality: N/A;  . Right Shoulder Surgery  2010  . TUBAL LIGATION      Prior to Admission medications   Medication Sig Start Date End Date Taking? Authorizing Provider  amLODipine (NORVASC) 5 MG tablet Take 5 mg by mouth daily. 04/18/16  Yes Historical Provider, MD  aspirin 325 MG tablet Take 325 mg by mouth daily.   Yes Historical Provider, MD  atorvastatin (LIPITOR) 10 MG tablet 1/2 tab by mouth daily Patient taking differently: Take 5 mg by mouth daily.  11/18/15  Yes Corwin Levins, MD  diltiazem (CARDIZEM CD) 180 MG 24 hr capsule Take 1 capsule (180 mg total) by mouth daily. 02/26/16  Yes Lonia Blood, MD  docusate sodium (COLACE) 100 MG capsule Take 100 mg by mouth daily.   Yes Historical Provider, MD  donepezil (ARICEPT) 5 MG tablet Take 1 tablet (5 mg total) by mouth at bedtime. 03/22/16  Yes Costin Otelia Sergeant, MD  feeding supplement, ENSURE ENLIVE, (ENSURE ENLIVE) LIQD Take 237 mLs by mouth 2 (two) times daily between meals. 02/07/16  Yes Narda Bonds, MD  fenofibrate micronized (LOFIBRA) 134 MG capsule TAKE 1 CAPSULE (134 MG TOTAL) BY MOUTH DAILY. 04/17/15  Yes Corwin Levins, MD  metoprolol tartrate (LOPRESSOR) 25 MG tablet Take 0.5 tablets (12.5 mg total) by mouth 2 (two) times daily. 03/22/16  Yes Costin Otelia Sergeant, MD  Multiple Vitamin (MULTIVITAMIN WITH MINERALS) TABS tablet Take 1 tablet by mouth daily.   Yes Historical Provider, MD  Multiple Vitamins-Minerals (PRESERVISION AREDS 2) CAPS Take  1 capsule by mouth 2 (two) times daily.   Yes Historical Provider, MD  OLANZapine (ZYPREXA) 2.5 MG tablet Take 2.5 mg by mouth daily. 04/10/16  Yes Historical Provider, MD  oxybutynin (DITROPAN-XL) 5 MG 24 hr tablet TAKE 1 TABLET BY MOUTH EVERY DAY 10/27/15  Yes Corwin Levins, MD  pantoprazole (PROTONIX) 40 MG tablet Take 1 tablet (40 mg total) by  mouth 2 (two) times daily before a meal. 02/26/16  Yes Lonia Blood, MD  pentoxifylline (TRENTAL) 400 MG CR tablet TAKE 1 TABLET (400 MG TOTAL) BY MOUTH DAILY. 04/17/15  Yes Corwin Levins, MD  polyethylene glycol powder (GLYCOLAX/MIRALAX) powder Mix 17g of Miralax in 8oz of water twice daily. 04/16/16  Yes Ruffin Frederick, MD    Scheduled Meds: . atorvastatin  5 mg Oral Daily  . donepezil  5 mg Oral QHS  . fenofibrate  160 mg Oral Daily  . multivitamin with minerals  1 tablet Oral Daily  . OLANZapine  2.5 mg Oral Daily  . oxybutynin  5 mg Oral Daily  . pantoprazole  40 mg Oral BID AC  . pentoxifylline  400 mg Oral Daily  . PRESERVISION AREDS 2  1 capsule Oral BID   Infusions: . sodium chloride    . sodium chloride     PRN Meds:    Allergies as of 04/19/2016  . (No Known Allergies)    Family History  Problem Relation Age of Onset  . Colon cancer Mother   . Diabetes Sister     Amputation    Social History   Social History  . Marital status: Widowed    Spouse name: N/A  . Number of children: 5  . Years of education: N/A   Occupational History  . retired Ship broker   Social History Main Topics  . Smoking status: Former Games developer  . Smokeless tobacco: Never Used     Comment: QUIT IN 57  . Alcohol use No  . Drug use: No  . Sexual activity: Not on file   Other Topics Concern  . Not on file   Social History Narrative   ** Merged History Encounter **        REVIEW OF SYSTEMS: Constitutional:  Weakness and frailty. ENT:  No nose bleeds Pulm:  No new difficulty breathing. CV:  Recent lower extremity edema has  resolved. Does not complain of chest pain.  No palpitations, no LE edema.  GU:  No hematuria, no frequency GI:  Per HPI.  A few months ago had been having constipation but this has not been a problem recently. Heme:Other than the hematochezia described above, she hasn't had any unusual bleeding or excessive bruising as far as the family knows.   Transfusions:  Per HPI Neuro:  No headaches, no peripheral tingling or numbness Derm:  No itching, no rash or sores.  Endocrine:  No sweats or chills.  No polyuria or dysuria ImmunReviewed and she is current on flu shot and multiple other vaccinations. Travel:  None beyond local counties in last few months.    PHYSICAL EXAM: Vital signs in last 24 hours: Vitals:   04/19/16 0632 04/19/16 0640  BP:  117/73  Pulse: 83   Resp: 20 (!) 28  Temp:  97.7 F (36.5 C)   Wt Readings from Last 3 Encounters:  04/19/16 43.5 kg (96 lb)  04/16/16 43.7 kg (96 lb 4 oz)  03/22/16 44.5 kg (98 lb 3.2 oz)    GenerFrail, chronically ill, aged AAF. She is comfortable and obtunded. Head:  No signs of head trauma. No facial asymmetry or swelling.  Eyes:  No scleral icterus, no conjunctival pallor. Ears:    Unable to assess her hearing because she is not responding to verbal cues and commands.  Nose:    no congestion or discharge. Mouth:    no blood in the mouth. She can't really open her  mouth wide so unable to fully assess the oral cavity. Neck:    no JVD, no masses. No thyromegaly. Lungs:    clear bilaterally. No labored breathing or cough. Heart:   tachycardic, irregularly irregular. S1, S2 present. Abdomen:   soft. Not tender. Bowel sounds hypoactive but no high-pitched or tinkling bowel sounds. No HSM. No masses. No hernias. .   Rectal:  I did not repeat the rectal exam but did see the red blood that is in the discarded linens.   Musc/Skeltl:  no gross joint joint swelling, warmth or erythema.  Muscle wasting of all 4 limbs. ExtreNo CCE.  Neurologic:    patient does not follow commands and is not verbal. She does respond to noxious stimuli.  No involuntary movements or tremor. Moaning at times. Skin:No gross open sores, rashes or suspicious lesions Nodes  No cervical adenopathy.   Psych:    unable to assess as she is not verbal.  Intake/Output from previous day: 01/14 0701 - 01/15 0700 In: 3225 [I.V.:150; Blood:2225; IV Piggyback:850] Out: -  Intake/Output this shift: No intake/output data recorded.  LAB RESULTS:  Recent Labs  04/16/16 1546 04/19/16 0112 04/19/16 0116  WBC 12.9* 12.6*  --   HGB 9.3* 7.8* 7.1*  HCT 28.4* 24.9* 21.0*  PLT 390.0 417*  --    BMET Lab Results  Component Value Date   NA 143 04/19/2016   NA 142 04/19/2016   NA 137 03/18/2016   K 3.5 04/19/2016   K 3.3 (L) 04/19/2016   K 3.8 03/18/2016   CL 107 04/19/2016   CL 108 04/19/2016   CL 105 03/18/2016   CO2 25 04/19/2016   CO2 26 03/18/2016   CO2 27 03/17/2016   GLUCOSE 122 (H) 04/19/2016   GLUCOSE 123 (H) 04/19/2016   GLUCOSE 103 (H) 03/18/2016   BUN 16 04/19/2016   BUN 13 04/19/2016   BUN <5 (L) 03/18/2016   CREATININE 0.70 04/19/2016   CREATININE 0.70 04/19/2016   CREATININE 0.94 03/18/2016   CALCIUM 8.4 (L) 04/19/2016   CALCIUM 7.7 (L) 03/18/2016   CALCIUM 7.9 (L) 03/17/2016   LFT  Recent Labs  04/19/16 0112  PROT 5.0*  ALBUMIN 2.1*  AST 21  ALT 14  ALKPHOS 58  BILITOT 0.2*   PT/INR Lab Results  Component Value Date   INR 3.32 04/19/2016   INR 3.3 04/13/2016   INR 1.29 03/18/2016   Hepatitis Panel No results for input(s): HEPBSAG, HCVAB, HEPAIGM, HEPBIGM in the last 72 hours. C-Diff No components found for: CDIFF Lipase     Component Value Date/Time   LIPASE <10 (L) 03/11/2016 1715    Drugs of Abuse  No results found for: LABOPIA, COCAINSCRNUR, LABBENZ, AMPHETMU, THCU, LABBARB   RADIOLOGY STUDIES: No results found.    IMPRESSION:   *  Recent GIB 02/2016: large GU, ulcer at GE Jx and HH.  On BID PPI for  last 3 months.    Now with hematochezia a/w prodrome of abdominal pain.   Nuclear medicine scan showing colonic source for the bleeding, suspect diverticular bleed. IR Dr Fredia Sorrow aware.    *  ABL anemia.  S/p PRBC x 2.    *  M.D. resumed Coumadin for history  Afib and hx CVA, ASPVD approximately 2 weeks ago. Within the last few months all anticoagulation had been discontinued due to GI bleeding. Chronic full dose aspirin in place. Previously on Plavix but that was discontinued several months ago.   INR supratherapeutic. Status  post FFP 2 and IV vitamin K 1 .    *   Pulmonary htn, diastolic CHF.      PLAN:     *  Spoke with staff from IR. The plan is to recheck pro-time/INR at 11 AM today. If her coags have normalized, Dr. Fredia Sorrow will consider angiography/embolization. No IR intervention until the INR is corrected.      Jennye Moccasin  04/19/2016, 8:17 AM Pager: [phone]

## 2016-04-19 NOTE — ED Notes (Signed)
Changed 2 more times for bloody stool with large amounts of clots.  Tolerated well.  Daughter remains at the bedside.  Encouraged to call for assistance as needed.

## 2016-04-19 NOTE — Progress Notes (Signed)
[facility] PROGRESS NOTE  Tracy [name] GMW:[id number] DOB: [birth year] DOA: 04/19/2016  PCP: Tracy Barre, MD  Brief History/Interval Summary: 83 year old female with a past medical history of atrial fibrillation, stroke on anticoagulation, was admitted in December with upper GI bleed due to gastric ulcer. Coumadin was held and then resumed about 10 days prior to this admission. She presented with abdominal pain and bright red blood per rectum. She was hospitalized for further management.  Reason for Visit: Hematochezia  Consultants: Gastroenterology. Interventional radiology.  Procedures: None yet  Antibiotics: None  Subjective/Interval History: Patient is sleepy, drowsy. She was given sedatives to help with nuclear medicine study.  ROS: Unable to do at this time.  Objective:  Vital Signs  Vitals:   04/19/16 0913 04/19/16 1000 04/19/16 1007 04/19/16 1100  BP:  125/63 (!) 102/47 (!) 96/57  Pulse:      Resp:  (!) 22 (!) 21 (!) 22  Temp: 97.4 F (36.3 C)     TempSrc: Oral     SpO2:  95% 100% 96%  Weight:      Height:        Intake/Output Summary (Last 24 hours) at 04/19/16 1114 Last data filed at 04/19/16 1100  Gross per 24 hour  Intake          3233.25 ml  Output                0 ml  Net          3233.25 ml   Filed Weights   04/19/16 0057 04/19/16 0900  Weight: 43.5 kg (96 lb) 44.4 kg (97 lb 14.2 oz)    General appearance: Somnolent. Does open her eyes to touch stimuli. Resp: Diminished air entry at the bases. No wheezing, rales or rhonchi. Cardio: regular rate and rhythm, S1, S2 normal, no murmur, click, rub or gallop GI: Abdomen is soft. Grimaces when the left side of the abdomen is palpated. No rebound, rigidity or guarding. No masses or organomegaly. Extremities: extremities normal, atraumatic, no cyanosis or edema Neurologic: Somnolent. No obvious deficits noted.  Lab Results:  Data Reviewed: I have personally reviewed following labs and  imaging studies  CBC:  Recent Labs Lab 04/16/16 1546 04/19/16 0112 04/19/16 0116 04/19/16 0933  WBC 12.9* 12.6*  --  13.0*  NEUTROABS 11.1*  --   --   --   HGB 9.3* 7.8* 7.1* 6.9*  HCT 28.4* 24.9* 21.0* 20.4*  MCV 84.9 87.4  --  86.1  PLT 390.0 417*  --  208    Basic Metabolic Panel:  Recent Labs Lab 04/19/16 0112 04/19/16 0116  NA 142 143  K 3.3* 3.5  CL 108 107  CO2 25  --   GLUCOSE 123* 122*  BUN 13 16  CREATININE 0.70 0.70  CALCIUM 8.4*  --     GFR: Estimated Creatinine Clearance: 33.8 mL/min (by C-G formula based on SCr of 0.7 mg/dL).  Liver Function Tests:  Recent Labs Lab 04/19/16 0112  AST 21  ALT 14  ALKPHOS 58  BILITOT 0.2*  PROT 5.0*  ALBUMIN 2.1*     Coagulation Profile:  Recent Labs Lab 04/13/16 04/19/16 0132 04/19/16 0933  INR 3.3 3.32 1.60     Radiology Studies: Nm Gi Blood Loss  Result Date: 04/19/2016 CLINICAL DATA:  Upper GI bleed, diverticulosis, on Coumadin, abdominal pain EXAM: NUCLEAR MEDICINE GASTROINTESTINAL BLEEDING SCAN TECHNIQUE: Sequential abdominal images were obtained following intravenous administration of Tc-30m labeled red blood cells. RADIOPHARMACEUTICALS:  25.8 mCi Tc-4210m in-vitro labeled red cells. COMPARISON:  CT abdomen/ pelvis dated 03/15/2016 FINDINGS: Within 5 minutes, there is increased radiotracer within the splenic flexure of the colon, indicating active GI bleeding. There is propagation into the descending colon during the course of the study, along with retrograde flow along the transverse colon at 55-60 minutes. The study was aborted at 60 minutes. IMPRESSION: Active GI bleeding localized to the splenic flexure of the colon. These results were called by telephone at the time of interpretation on 04/19/2016 at 9:19 am to Dr. Irish LackGlenn [name], who verbally acknowledged these results. Electronically Signed   By: Tracy [name] M.D.   On: 04/19/2016 09:19     Medications:  Scheduled: . sodium chloride  10  mL/hr Intravenous Once  . sodium chloride  10 mL/hr Intravenous Once  . acetaminophen  650 mg Oral Once  . donepezil  5 mg Oral QHS  . oxybutynin  5 mg Oral Daily  . pantoprazole (PROTONIX) IV  40 mg Intravenous Q12H   Continuous: . diltiazem (CARDIZEM) infusion 5 mg/hr (04/19/16 1001)   PRN:  Assessment/Plan:  Principal Problem:   Acute GI bleeding Active Problems:   Essential hypertension   Atrial fibrillation with RVR (HCC)   Acute blood loss anemia   BRBPR (bright red blood per rectum)    Hematochezia Most probably diverticular. Nuclear medicine scan did show bleeding in the splenic flexure. GI and interventional radiology is following. Will likely need to undergo angiogram and possible embolization.  Acute blood loss anemia Hemoglobin noted to be low despite blood transfusion. This is due to continued bleeding. She'll be transfused an additional 2 units of blood. Based on previous echocardiogram, she has normal ejection fraction. If her blood pressure tolerates, we will give her Lasix in between blood transfusion to avoid fluid overload.  Coagulopathy Due to warfarin. INR has been aggressively reversed.  Atrial fibrillation with RVR Heart rate is so poorly controlled. Unable to give her oral medications at this time. Initiate diltiazem infusion. Warfarin has been held due to bleed. CHADS-VASc is at least 5.  History of stroke Stable. Continue to monitor.  History of gastric ulcer Does not seem to be an active issue at this time. PPI.  DVT Prophylaxis: SCDs    Code Status: DO NOT RESUSCITATE  Family Communication: No family at bedside  Disposition Plan: Management as outlined above.    LOS: 0 days   [facility] Hospital At Las ColinasKRISHNAN,Mikya [name]  [facility] Pager [phone] 04/19/2016, 11:14 AM  If 7PM-7AM, please contact night-coverage at [email or web address], password [email or web address]

## 2016-04-19 NOTE — ED Triage Notes (Signed)
Brought by EMS from home for c/o lower GI bleed.  Per spouse filled bedside commode 1/3 of the way 4 times.  Also filled depends and is having bm in triage.  Hx of ulcers that were cauterized.  Stopped coumadin then restarted Friday per patient.

## 2016-04-19 NOTE — Progress Notes (Signed)
Upon entering the room to do the assessment as the oncoming nurse pt very lethargic, required multiple sternal rubs after attempting to wake up via usual methods. Pt woke up and started cussing saying "I can't believe this, I cant get any sleep. Just come back in the morning."  Lab came shortly after this and pt refused repeat H&H. Explained importance of repeat testing to assess for blood loss. Pt continued to refuse. States "Just come back in the morning when my people are here." [facility] paged. Awaiting response. Will continue to monitor closely. Modena JanskyKevin Rolondo Pierre RN [address] [place] [facility]

## 2016-04-19 NOTE — ED Notes (Signed)
Incontinent of another large bloody stool with blood clots.  Care provided.  Barrier cream applied to bottom for protection.

## 2016-04-19 NOTE — ED Notes (Signed)
Pt in [place] 

## 2016-04-19 NOTE — H&P (Addendum)
History and Physical    Tracy [name] ZOX:[id number] DOB: Feb 08, 1927 DOA: 04/19/2016   PCP: Oliver Barre, MD Chief Complaint:  Chief Complaint  Patient presents with  . GI Bleeding    HPI: Tracy [name] is a 83 y.o. female with medical history significant of A.Fib, CVA, on coumadin.  Patient admitted in Nov with iron def anemia.  Re admitted in Dec with UGIB due to gastric ulcer and ulcer at [facility] with large hiatal hernia on 11/20.   Also had a Dec hospital stay for Delirium.  Coumadin resumed 10 days ago for A.Fib.  Patient presents to the ED today with persistent abdominal pain for past 3 days and associated hematochezia that started this evening.  Patient with 4 bright red blood BMs with clots at home.  Had ~3 more since arrival to ED.  HGB is down to 7.8 from 9.3 just 3 days ago on a GI office follow up visit.  Nothing makes symptoms better or worse, symptoms are persistent and severe.  Dr. Russella Dar was called by EDP: he plans to see patient first thing in the morning, call him back if BP drops.  Patient getting 2 units PRBC and 2 units FFP transfused.  INR is 3.x.  Review of Systems: As per HPI otherwise 10 point review of systems negative.    Past Medical History:  Diagnosis Date  . Atrial fibrillation (HCC)   . CAROTID STENOSIS   . CVA (cerebrovascular accident) (HCC) 2002  . DIABETES MELLITUS, TYPE II   . DIVERTICULOSIS, COLON   . DM (diabetes mellitus), type 2 (HCC)   . DVT (deep venous thrombosis) (HCC)   . Gastric ulcer   . GERD   . HTN (hypertension)   . Hx of adenomatous colonic polyps 2007  . Hyperlipidemia   . Hypertension   . OSTEOPENIA   . PVD (peripheral vascular disease) (HCC)    recently dx w/ carotid -left  int carotid >90% 50-60% rt int carotid 02/2010  . PVD (peripheral vascular disease) (HCC)    lf  ABI 0.11 and rt 0.43  . Sinus node dysfunction (HCC) 2011  . Stroke [facility]) July 01, 2014   Affected Left side  . Upper GI bleed 02/2016    Past  Surgical History:  Procedure Laterality Date  . CAROTID ENDARTERECTOMY    . cataract eye surgery    . CNS vascular stent per Neurosurgeon    . ESOPHAGOGASTRODUODENOSCOPY (EGD) WITH PROPOFOL N/A 02/24/2016   Procedure: ESOPHAGOGASTRODUODENOSCOPY (EGD) WITH PROPOFOL;  Surgeon: Kathi Der, MD;  Location: [facility] ENDOSCOPY;  Service: Gastroenterology;  Laterality: N/A;  . EYE SURGERY    . ILIAC ARTERY STENT  2011   Bilateral CIA stent  . lazer eye surgery    . RADIOLOGY WITH ANESTHESIA N/A 03/20/2016   Procedure: MRI OF BRAIN;  Surgeon: Medication Radiologist, MD;  Location: [facility] OR;  Service: Radiology;  Laterality: N/A;  . Right Shoulder Surgery  2010  . TUBAL LIGATION       reports that she has quit smoking. She has never used smokeless tobacco. She reports that she does not drink alcohol or use drugs.  No Known Allergies  Family History  Problem Relation Age of Onset  . Colon cancer Mother   . Diabetes Sister     Amputation      Prior to Admission medications   Medication Sig Start Date End Date Taking? Authorizing Provider  amLODipine (NORVASC) 5 MG tablet Take 5 mg by mouth daily. 04/18/16  Yes Historical Provider, MD  aspirin 325 MG tablet Take 325 mg by mouth daily.   Yes Historical Provider, MD  atorvastatin (LIPITOR) 10 MG tablet 1/2 tab by mouth daily Patient taking differently: Take 5 mg by mouth daily.  11/18/15  Yes Corwin LevinsJames W John, MD  diltiazem (CARDIZEM CD) 180 MG 24 hr capsule Take 1 capsule (180 mg total) by mouth daily. 02/26/16  Yes Lonia BloodJeffrey T McClung, MD  docusate sodium (COLACE) 100 MG capsule Take 100 mg by mouth daily.   Yes Historical Provider, MD  donepezil (ARICEPT) 5 MG tablet Take 1 tablet (5 mg total) by mouth at bedtime. 03/22/16  Yes Costin Otelia SergeantM Gherghe, MD  feeding supplement, ENSURE ENLIVE, (ENSURE ENLIVE) LIQD Take 237 mLs by mouth 2 (two) times daily between meals. 02/07/16  Yes Narda Bondsalph A Nettey, MD  fenofibrate micronized (LOFIBRA) 134 MG capsule TAKE 1  CAPSULE (134 MG TOTAL) BY MOUTH DAILY. 04/17/15  Yes Corwin LevinsJames W John, MD  metoprolol tartrate (LOPRESSOR) 25 MG tablet Take 0.5 tablets (12.5 mg total) by mouth 2 (two) times daily. 03/22/16  Yes Costin Otelia SergeantM Gherghe, MD  Multiple Vitamin (MULTIVITAMIN WITH MINERALS) TABS tablet Take 1 tablet by mouth daily.   Yes Historical Provider, MD  Multiple Vitamins-Minerals (PRESERVISION AREDS 2) CAPS Take 1 capsule by mouth 2 (two) times daily.   Yes Historical Provider, MD  OLANZapine (ZYPREXA) 2.5 MG tablet Take 2.5 mg by mouth daily. 04/10/16  Yes Historical Provider, MD  oxybutynin (DITROPAN-XL) 5 MG 24 hr tablet TAKE 1 TABLET BY MOUTH EVERY DAY 10/27/15  Yes Corwin LevinsJames W John, MD  pantoprazole (PROTONIX) 40 MG tablet Take 1 tablet (40 mg total) by mouth 2 (two) times daily before a meal. 02/26/16  Yes Lonia BloodJeffrey T McClung, MD  pentoxifylline (TRENTAL) 400 MG CR tablet TAKE 1 TABLET (400 MG TOTAL) BY MOUTH DAILY. 04/17/15  Yes Corwin LevinsJames W John, MD  polyethylene glycol powder (GLYCOLAX/MIRALAX) powder Mix 17g of Miralax in 8oz of water twice daily. 04/16/16  Yes Ruffin FrederickSteven Paul Armbruster, MD    Physical Exam: Vitals:   04/19/16 0158 04/19/16 0159 04/19/16 0200 04/19/16 0215  BP: 121/80  138/73 139/67  Pulse: 118   111  Resp: 24  23 25   Temp: 98.1 F (36.7 C) 98.1 F (36.7 C)    TempSrc: Oral Oral    SpO2: 100%   100%  Weight:      Height:          Constitutional: NAD, calm, comfortable Eyes: PERRL, lids and conjunctivae normal ENMT: Mucous membranes are moist. Posterior pharynx clear of any exudate or lesions.Normal dentition.  Neck: normal, supple, no masses, no thyromegaly Respiratory: clear to auscultation bilaterally, no wheezing, no crackles. Normal respiratory effort. No accessory muscle use.  Cardiovascular: Irr, irr, tachycardic Abdomen: no tenderness, no masses palpated. No hepatosplenomegaly. Bowel sounds positive.  Musculoskeletal: no clubbing / cyanosis. No joint deformity upper and lower extremities.  Good ROM, no contractures. Normal muscle tone.  Skin: no rashes, lesions, ulcers. No induration Neurologic: CN 2-12 grossly intact. Sensation intact, DTR normal. Strength 5/5 in all 4.  Psychiatric: Normal judgment and insight. Alert and oriented x 3. Normal mood.    Labs on Admission: I have personally reviewed following labs and imaging studies  CBC:  Recent Labs Lab 04/16/16 1546 04/19/16 0112 04/19/16 0116  WBC 12.9* 12.6*  --   NEUTROABS 11.1*  --   --   HGB 9.3* 7.8* 7.1*  HCT 28.4* 24.9* 21.0*  MCV 84.9 87.4  --  PLT 390.0 417*  --    Basic Metabolic Panel:  Recent Labs Lab 04/19/16 0112 04/19/16 0116  NA 142 143  K 3.3* 3.5  CL 108 107  CO2 25  --   GLUCOSE 123* 122*  BUN 13 16  CREATININE 0.70 0.70  CALCIUM 8.4*  --    GFR: Estimated Creatinine Clearance: 33.8 mL/min (by C-G formula based on SCr of 0.7 mg/dL). Liver Function Tests:  Recent Labs Lab 04/19/16 0112  AST 21  ALT 14  ALKPHOS 58  BILITOT 0.2*  PROT 5.0*  ALBUMIN 2.1*   No results for input(s): LIPASE, AMYLASE in the last 168 hours. No results for input(s): AMMONIA in the last 168 hours. Coagulation Profile:  Recent Labs Lab 04/13/16 04/19/16 0132  INR 3.3 3.32   Cardiac Enzymes: No results for input(s): CKTOTAL, CKMB, CKMBINDEX, TROPONINI in the last 168 hours. BNP (last 3 results) No results for input(s): PROBNP in the last 8760 hours. HbA1C: No results for input(s): HGBA1C in the last 72 hours. CBG: No results for input(s): GLUCAP in the last 168 hours. Lipid Profile: No results for input(s): CHOL, HDL, LDLCALC, TRIG, CHOLHDL, LDLDIRECT in the last 72 hours. Thyroid Function Tests: No results for input(s): TSH, T4TOTAL, FREET4, T3FREE, THYROIDAB in the last 72 hours. Anemia Panel: No results for input(s): VITAMINB12, FOLATE, FERRITIN, TIBC, IRON, RETICCTPCT in the last 72 hours. Urine analysis:    Component Value Date/Time   COLORURINE YELLOW 03/17/2016 1507    APPEARANCEUR CLEAR 03/17/2016 1507   LABSPEC 1.006 03/17/2016 1507   PHURINE 8.0 03/17/2016 1507   GLUCOSEU NEGATIVE 03/17/2016 1507   GLUCOSEU NEGATIVE 02/13/2016 1201   HGBUR NEGATIVE 03/17/2016 1507   BILIRUBINUR NEGATIVE 03/17/2016 1507   KETONESUR NEGATIVE 03/17/2016 1507   PROTEINUR NEGATIVE 03/17/2016 1507   UROBILINOGEN 0.2 02/13/2016 1201   NITRITE NEGATIVE 03/17/2016 1507   LEUKOCYTESUR SMALL (A) 03/17/2016 1507   Sepsis Labs: @LABRCNTIP (procalcitonin:4,lacticidven:4) )No results found for this or any previous visit (from the past 240 hour(s)).   Radiological Exams on Admission: No results found.  EKG: Independently reviewed.  Assessment/Plan Principal Problem:   Acute GI bleeding Active Problems:   Essential hypertension   Atrial fibrillation with RVR (HCC)   Acute blood loss anemia   BRBPR (bright red blood per rectum)    1. Acute GIB - suspect diverticular bleed given BRBPR in sharp contrast to melena she had in Nov with gastric ulcers. 1. Reverse INR with 10mg  vit K and 2 units FFP 2. 2 unit PRBC transfusion 3. If BP drops then emergently plan to give K-centra and call Dr. Russella Dar in emergently this morning. 4. Tagged RBC scan ordered stat 5. If positive, call IR for embolization 2. Acute blood loss anemia - 1. 2 unit PRBC transfusion 2. Recheck CBC at 0700 (or when transfusion done) 3. HTN - hold home BP meds 4. A.Fib - 1. hold home rate control meds 2. if she goes into worsening RVR then start cardizem gtt for rate control here in hospital 3. Reversing coumadin, and at this point with 2 major GIBs in 2 months, probably should be off of this med I think.   DVT prophylaxis: SCDs Code Status: Full Family Communication: Family at bedside Consults called: GI called by EDP Admission status: Admit to inpatient   Hillary Bow DO [facility] Pager [phone] from 7PM-7AM  If 7AM-7PM, please contact the day physician for the  patient [email or web address] Password [facility]  04/19/2016, 3:44 AM

## 2016-04-20 ENCOUNTER — Ambulatory Visit: Payer: Medicare Other | Admitting: Internal Medicine

## 2016-04-20 DIAGNOSIS — K5731 Diverticulosis of large intestine without perforation or abscess with bleeding: Principal | ICD-10-CM

## 2016-04-20 DIAGNOSIS — K922 Gastrointestinal hemorrhage, unspecified: Secondary | ICD-10-CM

## 2016-04-20 LAB — CBC
HCT: 27.5 % — ABNORMAL LOW (ref 36.0–46.0)
HCT: 27.1 % — ABNORMAL LOW (ref 36.0–46.0)
HEMOGLOBIN: 9.7 g/dL — AB (ref 12.0–15.0)
Hemoglobin: 9.4 g/dL — ABNORMAL LOW (ref 12.0–15.0)
MCH: 29.9 pg (ref 26.0–34.0)
MCH: 29.7 pg (ref 26.0–34.0)
MCHC: 35.3 g/dL (ref 30.0–36.0)
MCHC: 34.7 g/dL (ref 30.0–36.0)
MCV: 84.9 fL (ref 78.0–100.0)
MCV: 85.5 fL (ref 78.0–100.0)
PLATELETS: 214 10*3/uL (ref 150–400)
Platelets: 206 10*3/uL (ref 150–400)
RBC: 3.24 MIL/uL — ABNORMAL LOW (ref 3.87–5.11)
RBC: 3.17 MIL/uL — ABNORMAL LOW (ref 3.87–5.11)
RDW: 15.4 % (ref 11.5–15.5)
RDW: 15.6 % — AB (ref 11.5–15.5)
WBC: 13.5 10*3/uL — ABNORMAL HIGH (ref 4.0–10.5)
WBC: 12.7 10*3/uL — ABNORMAL HIGH (ref 4.0–10.5)

## 2016-04-20 LAB — PREPARE FRESH FROZEN PLASMA
UNIT DIVISION: 0
Unit division: 0

## 2016-04-20 LAB — BASIC METABOLIC PANEL
ANION GAP: 7 (ref 5–15)
BUN: 18 mg/dL (ref 6–20)
CO2: 26 mmol/L (ref 22–32)
Calcium: 8.2 mg/dL — ABNORMAL LOW (ref 8.9–10.3)
Chloride: 111 mmol/L (ref 101–111)
Creatinine, Ser: 0.75 mg/dL (ref 0.44–1.00)
GFR calc Af Amer: >60 mL/min (ref >60)
GFR calc non Af Amer: >60 mL/min (ref >60)
GLUCOSE: 90 mg/dL (ref 65–99)
POTASSIUM: 3.6 mmol/L (ref 3.5–5.1)
Sodium: 144 mmol/L (ref 135–145)

## 2016-04-20 MED ORDER — METOPROLOL TARTRATE 25 MG PO TABS
25.0000 mg | ORAL_TABLET | Freq: Two times a day (BID) | ORAL | Status: DC
  Administered 2016-04-20 – 2016-04-22 (×5): 25 mg via ORAL
  Filled 2016-04-20 (×5): qty 1

## 2016-04-20 MED ORDER — ORAL CARE MOUTH RINSE
15.0000 mL | Freq: Two times a day (BID) | OROMUCOSAL | Status: DC
  Administered 2016-04-20: 15 mL via OROMUCOSAL

## 2016-04-20 MED ORDER — CHLORHEXIDINE GLUCONATE 0.12 % MT SOLN
15.0000 mL | Freq: Two times a day (BID) | OROMUCOSAL | Status: DC
  Administered 2016-04-20 – 2016-04-22 (×3): 15 mL via OROMUCOSAL
  Filled 2016-04-20 (×5): qty 15

## 2016-04-20 MED ORDER — DILTIAZEM HCL 60 MG PO TABS
60.0000 mg | ORAL_TABLET | Freq: Three times a day (TID) | ORAL | Status: DC
  Administered 2016-04-20 – 2016-04-22 (×7): 60 mg via ORAL
  Filled 2016-04-20 (×7): qty 1

## 2016-04-20 NOTE — Evaluation (Signed)
Physical Therapy Evaluation Patient Details Name: Tracy [name] MRN: [id number] DOB: 11-Mar-1927 Today's Date: 04/20/2016   History of Present Illness  Patient is a 83 y/o female with hx of dementia, CVA, chronic A-fib not anticoaguated due to GI bleeding, HTN, PVD presents from home with blood in her stool. Found to have active bleeding in the splenic flexure of the colon, likely diverticular bleed in the setting of coumadin and aspirin. COumadin was reversed.  Clinical Impression  Patient presents with generalized weakness, deconditioning and impaired balance s/p above. Tolerated SPT to/from [facility] with Min-Mod A for balance/safety. Pt with complete LOB requiring Mod A to prevent fall. Declined OOB to chair today. Not sure of accuracy of pt's reported PLOF secondary to hx of dementia. If pt has 24/7 supervision from son, she is safe to return home with hands on assist and HHPT continued but if pt does not, then will need SNF. Will follow acutely to maximize independence and mobility prior to return home.     Follow Up Recommendations SNF    Equipment Recommendations  None recommended by PT    Recommendations for Other Services OT consult     Precautions / Restrictions Precautions Precautions: Fall Restrictions Weight Bearing Restrictions: No      Mobility  Bed Mobility Overal bed mobility: Needs Assistance Bed Mobility: Supine to Sit;Sit to Supine     Supine to sit: Mod assist;HOB elevated Sit to supine: Mod assist;HOB elevated   General bed mobility comments: Assist to elevate trunk to get to EOB with pt reaching out to grab therapist's hand despite cues to use rail. Pt plopped back into bed upon returning to supine.  Transfers Overall transfer level: Needs assistance Equipment used: 1 person hand held assist Transfers: Sit to/from [employer] Sit to Stand: Min assist Stand pivot transfers: Mod assist       General transfer comment: Assist to power to  standing, declined using RW. Pt with posterior LOB initially. SPT bed to/from [facility] with LOB requiring Mod A to prevent fall.  Ambulation/Gait Ambulation/Gait assistance:  (Declined ambulation or transfer to chair)              Stairs            Wheelchair Mobility    Modified Rankin (Stroke Patients Only)       Balance Overall balance assessment: Needs assistance Sitting-balance support: Feet supported;No upper extremity supported Sitting balance-Leahy Scale: Fair     Standing balance support: During functional activity;Single extremity supported Standing balance-Leahy Scale: Poor Standing balance comment: Requires at least UE support and Min A for static and dynamic standing balance.                              Pertinent Vitals/Pain Pain Assessment: No/denies pain    Home Living Family/patient expects to be discharged to:: Private residence Living Arrangements: Children Available Help at Discharge: Family;Available 24 hours/day Type of Home: House Home Access: Level entry     Home Layout: One level Home Equipment: Bedside commode;Walker - 4 wheels;Walker - 2 wheels;Shower seat;Wheelchair - manual Additional Comments: had HHPT and HHRN prior to admission    Prior Function Level of Independence: Needs assistance   Gait / Transfers Assistance Needed: Uses RW for ambulation. w/c for community ambulation.  ADL's / Homemaking Assistance Needed: family assist with bathing/dressing        Hand Dominance   Dominant Hand: Right  Extremity/Trunk Assessment   Upper Extremity Assessment Upper Extremity Assessment: Defer to OT evaluation    Lower Extremity Assessment Lower Extremity Assessment: Generalized weakness    Cervical / Trunk Assessment Cervical / Trunk Assessment: Kyphotic  Communication   Communication: No difficulties  Cognition Arousal/Alertness: Awake/alert Behavior During Therapy: WFL for tasks assessed/performed Overall  Cognitive Status: No family/caregiver present to determine baseline cognitive functioning                      General Comments      Exercises     Assessment/Plan    PT Assessment Patient needs continued PT services  PT Problem List Decreased strength;Decreased mobility;Decreased safety awareness;Decreased cognition;Decreased activity tolerance;Decreased balance          PT Treatment Interventions DME instruction;Therapeutic activities;Gait training;Therapeutic exercise;Patient/family education;Balance training;Functional mobility training    PT Goals (Current goals can be found in the Care Plan section)  Acute Rehab PT Goals Patient Stated Goal: feel better PT Goal Formulation: With patient Time For Goal Achievement: 05/04/16 Potential to Achieve Goals: Fair    Frequency Min 3X/week   Barriers to discharge        Co-evaluation               End of Session Equipment Utilized During Treatment: Gait belt Activity Tolerance: Patient tolerated treatment well;Patient limited by fatigue Patient left: in bed;with call bell/phone within reach;with bed alarm set;with SCD's reapplied Nurse Communication: Mobility status         Time: [id number] PT Time Calculation (min) (ACUTE ONLY): 19 min   Charges:   PT Evaluation $PT Eval Low Complexity: 1 Procedure     PT G Codes:        Jordie Skalsky A Mattheu Brodersen 04/20/2016, 3:41 PM Mylo RedShauna Eisa Necaise, PT, DPT [phone]

## 2016-04-20 NOTE — Progress Notes (Signed)
Progress Note   Subjective  Patient states she had a good night. No further bleeding since yesterday morning. She denies abdominal pain. Overall improved from yesterday, more alert.    Objective   Vital signs in last 24 hours: Temp:  [97.4 F (36.3 C)-100.4 F (38 C)] 100.4 F (38 C) (01/15 2000) Pulse Rate:  [82] 82 (01/16 0700) Resp:  [14-28] 19 (01/16 0700) BP: (96-160)/(44-94) 111/57 (01/16 0700) SpO2:  [88 %-100 %] 100 % (01/16 0700) Weight:  [97 lb 14.2 oz (44.4 kg)] 97 lb 14.2 oz (44.4 kg) (01/15 0900) Last BM Date: 04/19/16 General:    AA female in NAD Heart:  Irregularly irregular rate and rhythm Lungs: Respirations even and unlabored, lungs CTA bilaterally Abdomen:  Soft, nontender and nondistended.  Extremities:  Without edema. Neurologic:  Alert and oriented,  grossly normal neurologically. Psych:  Cooperative. Normal mood and affect.  Intake/Output from previous day: 01/15 0701 - 01/16 0700 In: 927 [I.V.:313; Blood:614] Out: 550 [Urine:550] Intake/Output this shift: No intake/output data recorded.  Lab Results:  Recent Labs  04/19/16 0933 04/19/16 1816 04/20/16 0537  WBC 13.0* 15.3* 13.5*  HGB 6.9* 10.6* 9.7*  HCT 20.4* 30.8* 27.5*  PLT 208 208 206   BMET  Recent Labs  04/19/16 0112 04/19/16 0116 04/20/16 0537  NA 142 143 144  K 3.3* 3.5 3.6  CL 108 107 111  CO2 25  --  26  GLUCOSE 123* 122* 90  BUN 13 16 18   CREATININE 0.70 0.70 0.75  CALCIUM 8.4*  --  8.2*   LFT  Recent Labs  04/19/16 0112  PROT 5.0*  ALBUMIN 2.1*  AST 21  ALT 14  ALKPHOS 58  BILITOT 0.2*   PT/INR  Recent Labs  04/19/16 0933 04/19/16 1816  LABPROT 19.2* 16.6*  INR 1.60 1.33    Studies/Results: Nm Gi Blood Loss  Result Date: 04/19/2016 CLINICAL DATA:  Upper GI bleed, diverticulosis, on Coumadin, abdominal pain EXAM: NUCLEAR MEDICINE GASTROINTESTINAL BLEEDING SCAN TECHNIQUE: Sequential abdominal images were obtained following intravenous  administration of Tc-63m labeled red blood cells. RADIOPHARMACEUTICALS:  25.8 mCi Tc-17m in-vitro labeled red cells. COMPARISON:  CT abdomen/ pelvis dated 03/15/2016 FINDINGS: Within 5 minutes, there is increased radiotracer within the splenic flexure of the colon, indicating active GI bleeding. There is propagation into the descending colon during the course of the study, along with retrograde flow along the transverse colon at 55-60 minutes. The study was aborted at 60 minutes. IMPRESSION: Active GI bleeding localized to the splenic flexure of the colon. These results were called by telephone at the time of interpretation on 04/19/2016 at 9:19 am to Dr. Irish Lack, who verbally acknowledged these results. Electronically Signed   By: Charline Bills M.D.   On: 04/19/2016 09:19       Assessment / Plan:   83 y/o female with history of Afibb and CVA, with history of bleeding gastric ulcer in November, presented with lower GI bleeding in the setting of coumadin and aspirin. Tagged RBC scan showed active bleeding in the splenic flexure of the colon, likely diverticular bleed. IR consulted and appreciate their assistance, given her anatomy she is higher than average risk for angiography / embolization and this was deferred. Coumadin was reversed with FFP and fortunately she has not had further bleeding since that time. Her Hgb rose higher than expected after transfusion yesterday (10s), this AM in the 9s.   At this time recommend the following: - clear liquid  diet for now, advance to solids later this afternoon if she does well - trend Hgb, monitor for recurrent bleeding - given her significant bleeding yesterday I would monitor her in the hospital overnight again today, if no further bleeding overnight then discharge tomorrow - given her history of GI bleeding, would recommend holding off on aggressive anticoagulation at this time given risk of recurrence, but defer long term decision to primary  provider as she has had history of CVA. In brief discussion with family yesterday, their preference would be aspirin monotherapy  We will follow with you, please page with any questions or symptoms concerning for recurrent bleeding.  Ileene PatrickSteven Armbruster, MD [facility] Pager [phone]

## 2016-04-20 NOTE — Plan of Care (Signed)
Problem: Bowel/Gastric: Goal: Will show no signs and symptoms of gastrointestinal bleeding Outcome: Progressing Last H&H stable. Pt refusing lab at 11,4. MD aware. No Bm's  Problem: Fluid Volume: Goal: Will show no signs and symptoms of excessive bleeding Outcome: Progressing No further BM's

## 2016-04-20 NOTE — Progress Notes (Signed)
[facility] PROGRESS NOTE  Tracy [name] EAV:[id number] DOB: [birth year] DOA: 04/19/2016  PCP: Oliver BarreJames John, MD  Brief History/Interval Summary: 83 year old female with a past medical history of atrial fibrillation, stroke on anticoagulation, was admitted in December with upper GI bleed due to gastric ulcer. Coumadin was held and then resumed about 10 days prior to this admission. She presented with abdominal pain and bright red blood per rectum. She was hospitalized for further management. She underwent RBC tagged scan which showed bleeding in the splenic flexure. Interventional radiology was consulted. Patient and family elected to wait and watch. Bleeding appears to have subsided.  Reason for Visit: Hematochezia  Consultants: Gastroenterology. Interventional radiology.  Procedures: None yet  Antibiotics: None  Subjective/Interval History: Patient is awake and alert today. Denies any complaints. No nausea or vomiting. Hasn't had any further bleeding since yesterday.   ROS: Unable to do at this time.  Objective:  Vital Signs  Vitals:   04/20/16 0400 04/20/16 0500 04/20/16 0600 04/20/16 0700  BP: 119/74 (!) 116/59 111/64 (!) 111/57  Pulse:    82  Resp: 19 20 14 19   Temp:      TempSrc:      SpO2: 100% 100% 100% 100%  Weight:      Height:        Intake/Output Summary (Last 24 hours) at 04/20/16 0727 Last data filed at 04/20/16 0600  Gross per 24 hour  Intake           926.96 ml  Output              550 ml  Net           376.96 ml   Filed Weights   04/19/16 0057 04/19/16 0900  Weight: 43.5 kg (96 lb) 44.4 kg (97 lb 14.2 oz)    General appearance: Awake and alert. In no distress. Resp: Diminished air entry at the bases. No wheezing, rales or rhonchi. Cardio: regular rate and rhythm, S1, S2 normal, no murmur, click, rub or gallop GI: Abdomen is soft. Mildly tender in the left side of the abdomen without any rebound, rigidity or guarding. No masses or  organomegaly. Extremities: extremities normal, atraumatic, no cyanosis or edema Neurologic: No obvious deficits noted.  Lab Results:  Data Reviewed: I have personally reviewed following labs and imaging studies  CBC:  Recent Labs Lab 04/16/16 1546 04/19/16 0112 04/19/16 0116 04/19/16 0933 04/19/16 1816 04/20/16 0537  WBC 12.9* 12.6*  --  13.0* 15.3* 13.5*  NEUTROABS 11.1*  --   --   --   --   --   HGB 9.3* 7.8* 7.1* 6.9* 10.6* 9.7*  HCT 28.4* 24.9* 21.0* 20.4* 30.8* 27.5*  MCV 84.9 87.4  --  86.1 85.3 84.9  PLT 390.0 417*  --  208 208 206    Basic Metabolic Panel:  Recent Labs Lab 04/19/16 0112 04/19/16 0116 04/20/16 0537  NA 142 143 144  K 3.3* 3.5 3.6  CL 108 107 111  CO2 25  --  26  GLUCOSE 123* 122* 90  BUN 13 16 18   CREATININE 0.70 0.70 0.75  CALCIUM 8.4*  --  8.2*    GFR: Estimated Creatinine Clearance: 33.8 mL/min (by C-G formula based on SCr of 0.75 mg/dL).  Liver Function Tests:  Recent Labs Lab 04/19/16 0112  AST 21  ALT 14  ALKPHOS 58  BILITOT 0.2*  PROT 5.0*  ALBUMIN 2.1*     Coagulation Profile:  Recent Labs Lab 04/19/16  0132 04/19/16 0933 04/19/16 1816  INR 3.32 1.60 1.33     Radiology Studies: Nm Gi Blood Loss  Result Date: 04/19/2016 CLINICAL DATA:  Upper GI bleed, diverticulosis, on Coumadin, abdominal pain EXAM: NUCLEAR MEDICINE GASTROINTESTINAL BLEEDING SCAN TECHNIQUE: Sequential abdominal images were obtained following intravenous administration of Tc-85m labeled red blood cells. RADIOPHARMACEUTICALS:  25.8 mCi Tc-49m in-vitro labeled red cells. COMPARISON:  CT abdomen/ pelvis dated 03/15/2016 FINDINGS: Within 5 minutes, there is increased radiotracer within the splenic flexure of the colon, indicating active GI bleeding. There is propagation into the descending colon during the course of the study, along with retrograde flow along the transverse colon at 55-60 minutes. The study was aborted at 60 minutes. IMPRESSION:  Active GI bleeding localized to the splenic flexure of the colon. These results were called by telephone at the time of interpretation on 04/19/2016 at 9:19 am to Dr. Irish Lack, who verbally acknowledged these results. Electronically Signed   By: Charline Bills M.D.   On: 04/19/2016 09:19     Medications:  Scheduled: . sodium chloride  10 mL/hr Intravenous Once  . sodium chloride  10 mL/hr Intravenous Once  . acetaminophen  650 mg Oral Once  . diltiazem  60 mg Oral Q8H  . donepezil  5 mg Oral QHS  . mouth rinse  15 mL Mouth Rinse BID  . metoprolol tartrate  25 mg Oral BID  . oxybutynin  5 mg Oral Daily  . pantoprazole (PROTONIX) IV  40 mg Intravenous Q12H   Continuous: . diltiazem (CARDIZEM) infusion 15 mg/hr (04/20/16 0315)   PRN:  Assessment/Plan:  Principal Problem:   Acute GI bleeding Active Problems:   Essential hypertension   Atrial fibrillation with RVR (HCC)   Acute blood loss anemia   BRBPR (bright red blood per rectum)    Hematochezia Most probably diverticular. Nuclear medicine scan did show bleeding in the splenic flexure. GI and interventional radiology is following. Patient and family elected to wait and watch rather than undergoing any procedure/angiogram. Bleeding appears to have subsided with reversal of warfarin. Gastroenterology is following. Okay to start clear liquids.  Acute blood loss anemia Patient was aggressively transfused. Hemoglobin is now stable. Continue to monitor periodically.   Coagulopathy Due to warfarin. INR has been aggressively reversed.  Atrial fibrillation with RVR Patient's heart rate was poorly controlled yesterday. Cardizem infusion was initiated. Patient is awake, alert. Able to take orally. Her Cardizem and metoprolol will be reinitiated. Diltiazem infusion can be discontinued. Warfarin has been held due to bleed. CHADS-VASc is at least 5.  History of stroke Stable. Continue to monitor.  History of gastric  ulcer Does not seem to be an active issue at this time. PPI.  DVT Prophylaxis: SCDs    Code Status: DO NOT RESUSCITATE  Family Communication: No family at bedside  Disposition Plan: Management as outlined above. Transition to oral rate limiting drugs. Monitor CBC's. Mobilize.    LOS: 1 day   [facility]  [facility] Pager [phone] 04/20/2016, 7:27 AM  If 7PM-7AM, please contact night-coverage at [email or web address], password [facility]

## 2016-04-20 NOTE — Progress Notes (Signed)
Report called to receiving RN. Patient transferred to 3W10 via wheelchair on room air. Assisted to bed. Receiving RN in room with patient. VSS, patient with no complaints. Pt tolerated transfer well.

## 2016-04-20 NOTE — Progress Notes (Signed)
Patient complaining of occasional sharp pain LLQ; significantly worse with palpation.  No active bleeding at this time and will continue to monitor.  Cardizem gtt off once patient transferred to this unit at 0852.

## 2016-04-21 LAB — CBC
HCT: 27 % — ABNORMAL LOW (ref 36.0–46.0)
HEMATOCRIT: 24.9 % — AB (ref 36.0–46.0)
HEMOGLOBIN: 8.6 g/dL — AB (ref 12.0–15.0)
Hemoglobin: 9.2 g/dL — ABNORMAL LOW (ref 12.0–15.0)
MCH: 30.1 pg (ref 26.0–34.0)
MCH: 29.8 pg (ref 26.0–34.0)
MCHC: 34.5 g/dL (ref 30.0–36.0)
MCHC: 34.1 g/dL (ref 30.0–36.0)
MCV: 87.1 fL (ref 78.0–100.0)
MCV: 87.4 fL (ref 78.0–100.0)
Platelets: 215 10*3/uL (ref 150–400)
Platelets: 252 10*3/uL (ref 150–400)
RBC: 2.86 MIL/uL — ABNORMAL LOW (ref 3.87–5.11)
RBC: 3.09 MIL/uL — ABNORMAL LOW (ref 3.87–5.11)
RDW: 16.1 % — ABNORMAL HIGH (ref 11.5–15.5)
RDW: 16.3 % — AB (ref 11.5–15.5)
WBC: 11 10*3/uL — ABNORMAL HIGH (ref 4.0–10.5)
WBC: 11.3 10*3/uL — ABNORMAL HIGH (ref 4.0–10.5)

## 2016-04-21 LAB — BASIC METABOLIC PANEL
Anion gap: 7 (ref 5–15)
BUN: 14 mg/dL (ref 6–20)
CHLORIDE: 111 mmol/L (ref 101–111)
CO2: 27 mmol/L (ref 22–32)
Calcium: 8.3 mg/dL — ABNORMAL LOW (ref 8.9–10.3)
Creatinine, Ser: 0.71 mg/dL (ref 0.44–1.00)
GFR calc Af Amer: >60 mL/min (ref >60)
GFR calc non Af Amer: >60 mL/min (ref >60)
Glucose, Bld: 100 mg/dL — ABNORMAL HIGH (ref 65–99)
Potassium: 3.3 mmol/L — ABNORMAL LOW (ref 3.5–5.1)
Sodium: 145 mmol/L (ref 135–145)

## 2016-04-21 LAB — PROTIME-INR
INR: 1.1
Prothrombin Time: 14.2 seconds (ref 11.4–15.2)

## 2016-04-21 MED ORDER — PANTOPRAZOLE SODIUM 40 MG PO TBEC
40.0000 mg | DELAYED_RELEASE_TABLET | Freq: Every day | ORAL | Status: DC
  Administered 2016-04-22: 40 mg via ORAL
  Filled 2016-04-21: qty 1

## 2016-04-21 NOTE — Progress Notes (Signed)
Progress Note   Subjective  Patient denies any stools with any blood overnight or yesterday. Nursing stated she may have had some blood in a bowel movement although this was not documented anywhere. I spoke with the patient's husband who was with her yesterday and he did not think she had any bowel movements yesterday. She had some transient LLQ pain yesterday, not bothering her now.   Objective   Vital signs in last 24 hours: Temp:  [97.5 F (36.4 C)-98.7 F (37.1 C)] 98.4 F (36.9 C) (01/17 0352) Pulse Rate:  [76-109] 76 (01/17 0054) Resp:  [14-26] 21 (01/17 0054) BP: (110-130)/(56-75) 120/56 (01/17 0054) SpO2:  [90 %-98 %] 90 % (01/17 0054) Last BM Date: 04/20/16 General:    AA female in NAD Heart:  Irregularly irregular rate and rhythm; no murmurs Lungs: Respirations even and unlabored, lungs CTA bilaterally Abdomen:  Soft, nontender and nondistended.  Extremities:  Without edema. Neurologic:  Alert and oriented,  grossly normal neurologically. Psych:  Cooperative. Normal mood and affect.  Intake/Output from previous day: 01/16 0701 - 01/17 0700 In: 1227.5 [P.O.:1200; I.V.:27.5] Out: -  Intake/Output this shift: No intake/output data recorded.  Lab Results:  Recent Labs  04/20/16 0537 04/20/16 1052 04/21/16 0413  WBC 13.5* 12.7* 11.0*  HGB 9.7* 9.4* 8.6*  HCT 27.5* 27.1* 24.9*  PLT 206 214 215   BMET  Recent Labs  04/19/16 0112 04/19/16 0116 04/20/16 0537 04/21/16 0413  NA 142 143 144 145  K 3.3* 3.5 3.6 3.3*  CL 108 107 111 111  CO2 25  --  26 27  GLUCOSE 123* 122* 90 100*  BUN 13 16 18 14   CREATININE 0.70 0.70 0.75 0.71  CALCIUM 8.4*  --  8.2* 8.3*   LFT  Recent Labs  04/19/16 0112  PROT 5.0*  ALBUMIN 2.1*  AST 21  ALT 14  ALKPHOS 58  BILITOT 0.2*   PT/INR  Recent Labs  04/19/16 0933 04/19/16 1816  LABPROT 19.2* 16.6*  INR 1.60 1.33    Studies/Results: Nm Gi Blood Loss  Result Date: 04/19/2016 CLINICAL DATA:  Upper  GI bleed, diverticulosis, on Coumadin, abdominal pain EXAM: NUCLEAR MEDICINE GASTROINTESTINAL BLEEDING SCAN TECHNIQUE: Sequential abdominal images were obtained following intravenous administration of Tc-4577m labeled red blood cells. RADIOPHARMACEUTICALS:  25.8 mCi Tc-4777m in-vitro labeled red cells. COMPARISON:  CT abdomen/ pelvis dated 03/15/2016 FINDINGS: Within 5 minutes, there is increased radiotracer within the splenic flexure of the colon, indicating active GI bleeding. There is propagation into the descending colon during the course of the study, along with retrograde flow along the transverse colon at 55-60 minutes. The study was aborted at 60 minutes. IMPRESSION: Active GI bleeding localized to the splenic flexure of the colon. These results were called by telephone at the time of interpretation on 04/19/2016 at 9:19 am to Dr. Irish LackGlenn Yamagata, who verbally acknowledged these results. Electronically Signed   By: Charline BillsSriyesh  Krishnan M.D.   On: 04/19/2016 09:19       Assessment / Plan:   83 y/o female with history of Afibb and CVA, with history of bleeding gastric ulcer in November, presented with lower GI bleeding in the setting of coumadin and aspirin. Tagged RBC scan showed active bleeding in the splenic flexure of the colon, likely diverticular bleed. IR consulted, given her anatomy she is higher than average risk for angiography / embolization and this was deferred. Coumadin was reversed with FFP. She does not appear to have any had  significant bleeding since that time although Hgb downtrended today. Spoke with husband who was with patient yesterday, he did not think she had any bowel movements, and none reported overnight   At this time recommend the following: - regular diet now - repeat Hgb this afteroon, monitor for recurrent bleeding, please contact me if any bleeding - pending her course today, may consider discharge later this afternoon vs tomorrow - given her history of GI bleeding, would  recommend holding off on aggressive anticoagulation at this time, but defer long term decision to primary provider as she has had history of CVA.   We will follow with you, please page with any questions or symptoms concerning for recurrent bleeding.  Ileene Patrick, MD [facility] Pager [phone]

## 2016-04-21 NOTE — Progress Notes (Signed)
Physical Therapy Treatment Patient Details Name: Tracy [name] MRN: [id number] DOB: 03-05-1927 Today's Date: 04/21/2016    History of Present Illness Patient is a 83 y/o female with hx of dementia, CVA, chronic A-fib not anticoaguated due to GI bleeding, HTN, PVD presents from home with blood in her stool. Found to have active bleeding in the splenic flexure of the colon, likely diverticular bleed in the setting of coumadin and aspirin. COumadin was reversed.    PT Comments    Pt able to make gradual progress with PT, [facility] by ambulation of 5 ft with rw to chair. SpO2 94% on RA following activity. PT to continue to follow acutely, recommending SNF for further rehabilitation following acute stay.   Follow Up Recommendations  SNF     Equipment Recommendations  None recommended by PT    Recommendations for Other Services       Precautions / Restrictions Precautions Precautions: Fall Restrictions Weight Bearing Restrictions: No    Mobility  Bed Mobility Overal bed mobility: Needs Assistance Bed Mobility: Supine to Sit     Supine to sit: Mod assist;HOB elevated     General bed mobility comments: assist with trunk to come to sitting EOB  Transfers Overall transfer level: Needs assistance Equipment used: Rolling walker (2 wheeled);1 person hand held assist (X2) Transfers: Sit to/from Stand Sit to Stand: Min assist Stand pivot transfers: Min assist       General transfer comment: min assist with [facility] used rw for transfer with min assist. Improved stability with rw  Ambulation/Gait Ambulation/Gait assistance: Min assist Ambulation Distance (Feet): 5 Feet Assistive device: Rolling walker (2 wheeled) Gait Pattern/deviations: Step-to pattern;Decreased stride length;Trunk flexed Gait velocity: decreased   General Gait Details: [facility] using rw   Stairs             Wheelchair Mobility    Modified Rankin (Stroke Patients Only)       Balance Overall balance assessment: Needs assistance Sitting-balance support: Feet supported;Single extremity supported Sitting balance-Leahy Scale: Fair     Standing balance support: Bilateral upper extremity supported Standing balance-Leahy Scale: Poor Standing balance comment: requiring physical assist or rw for support                    Cognition Arousal/Alertness: Awake/alert Behavior During Therapy: WFL for tasks assessed/performed Overall Cognitive Status: No family/caregiver present to determine baseline cognitive functioning                 General Comments: pt following commands consistently, decreased safety awareness.     Exercises      General Comments        Pertinent Vitals/Pain Pain Assessment: Faces Faces Pain Scale: Hurts a little bit Pain Location: abdomen Pain Descriptors / Indicators: Guarding Pain Intervention(s): Limited activity within patient's tolerance;Monitored during session    Home Living                      Prior Function            PT Goals (current goals can now be found in the care plan section) Acute Rehab PT Goals Patient Stated Goal: get out of hospital PT Goal Formulation: With patient Time For Goal Achievement: 05/04/16 Potential to Achieve Goals: Fair Progress towards PT goals: Progressing toward goals    Frequency    Min 3X/week  PT Plan Current plan remains appropriate    Co-evaluation             End of Session Equipment Utilized During Treatment: Gait belt Activity Tolerance: Patient tolerated treatment well Patient left: in chair;with call bell/phone within reach;with chair alarm set     Time: 0822-0843 PT Time Calculation (min) (ACUTE ONLY): 21 min  Charges:  $Therapeutic Activity: 8-22 mins                    G Codes:      Christiane Ha, PT, CSCS Pager [phone] Office [phone]  04/21/2016, 8:52 AM

## 2016-04-21 NOTE — Progress Notes (Signed)
Patient Demographics:    Tracy [name], is a 83 y.o. female, DOB - Dec 10, 1926, ZOX:[id number]  Admit date - 04/19/2016   Admitting Physician Hillary Bow, DO  Outpatient Primary MD for the patient is Oliver Barre, MD  LOS - 2   Chief Complaint  Patient presents with  . GI Bleeding        Subjective:    Tracy [name] today has no fevers, no emesis,  No chest pain,  No further bloody BM   Assessment  & Plan :    Principal Problem:   Acute GI bleeding Active Problems:   Essential hypertension   Atrial fibrillation with RVR (HCC)   Acute blood loss anemia   BRBPR (bright red blood per rectum)  Interval history 83 y/o female with history of Afib and CVA, with history of bleeding gastric ulcer in November 2017, Coumadin was held and then resumed about 10 days prior to this admission. She  presented again now with lower GI bleeding (bright red blood per rectum and abdominal pain) in the setting of coumadin and aspirin. Tagged RBC scan showed active bleeding in the splenic flexure of the colon, likely diverticular bleed. IR consulted, given her anatomy she is higher than average risk for angiography / embolization and this was deferred. Coumadin was reversed with FFP. She does not appear to have any had significant bleeding since that time although Hgb downtrended today. Spoke with husband who was with patient yesterday, he did not think she had any bowel movements, and none reported overnight  Given her history of GI bleeding, would recommend holding off on aggressive anticoagulation at this time, but defer long term decision to primary provider as she has had history of CVA.  Symptoms patient had upper GI bleeding from gastric ulcer in November 2017 and now patient has lower GI bleeding of diverticular origin  Consultants: Gastroenterology. Interventional radiology.  Plan:- Hematochezia -Most  probably diverticular. Nuclear medicine scan did show bleeding in the splenic flexure. GI and interventional radiology is following. Patient and family elected to wait and watch rather than undergoing any procedure/angiogram. Bleeding appears to have subsided with reversal of warfarin. Gastroenterology input appreciated patient's tolerating clear liquids well  2)Acute blood loss anemia- Patient was aggressively transfused. Hemoglobin is now stable.   3)Coagulopathy- Due to warfarin, INR was 3.2 on admission,  INR has been aggressively reversed.  4)Atrial fibrillation with RVR- patient is off Cardizem drip now okay to continue oral metoprolol and oral Cardizem, Warfarin has been held due to bleed. CHADS-VASc is at least 5.  5)History of stroke- Coumadin is on hold due to recurrent GI bleed  6)History of gastric ulcer- Does not seem to be an active issue at this time. PPI.   DVT Prophylaxis: SCDs    Code Status: DO NOT RESUSCITATE    Lab Results  Component Value Date   PLT 215 04/21/2016    Inpatient Medications  Scheduled Meds: . sodium chloride  10 mL/hr Intravenous Once  . sodium chloride  10 mL/hr Intravenous Once  . acetaminophen  650 mg Oral Once  . chlorhexidine  15 mL Mouth Rinse BID  . diltiazem  60 mg Oral Q8H  . donepezil  5 mg Oral QHS  . mouth rinse  15 mL Mouth Rinse q12n4p  . metoprolol tartrate  25 mg Oral BID  . oxybutynin  5 mg Oral Daily  . pantoprazole (PROTONIX) IV  40 mg Intravenous Q12H   Continuous Infusions: PRN Meds:.    Anti-infectives    None        Objective:   Vitals:   04/20/16 2054 04/21/16 0054 04/21/16 0352 04/21/16 0700  BP: 130/66 (!) 120/56  121/67  Pulse: 87 76  81  Resp: (!) 22 (!) 21    Temp:   98.4 F (36.9 C) 98.6 F (37 C)  TempSrc:   Oral Axillary  SpO2: 93% 90%  100%  Weight:      Height:        Wt Readings from Last 3 Encounters:  04/19/16 44.4 kg (97 lb 14.2 oz)  04/16/16 43.7 kg (96 lb 4 oz)  03/22/16  44.5 kg (98 lb 3.2 oz)     Intake/Output Summary (Last 24 hours) at 04/21/16 0906 Last data filed at 04/20/16 1700  Gross per 24 hour  Intake             1200 ml  Output                0 ml  Net             1200 ml     Physical Exam  Gen:- Awake Alert,  In no apparent distress  HEENT:- [place].AT, No sclera icterus Neck-Supple Neck,No JVD,.  Lungs-  CTAB  CV- S1, S2 normal, irregular Abd-  +ve B.Sounds, Abd Soft, No tenderness,    Extremity/Skin:- No  edema,      Data Review:   Micro Results Recent Results (from the past 240 hour(s))  MRSA PCR Screening     Status: None   Collection Time: 04/19/16 10:03 AM  Result Value Ref Range Status   MRSA by PCR NEGATIVE NEGATIVE Final    Comment:        The GeneXpert MRSA Assay (FDA approved for NASAL specimens only), is one component of a comprehensive MRSA colonization surveillance program. It is not intended to diagnose MRSA infection nor to guide or monitor treatment for MRSA infections.     Radiology Reports Nm Gi Blood Loss  Result Date: 04/19/2016 CLINICAL DATA:  Upper GI bleed, diverticulosis, on Coumadin, abdominal pain EXAM: NUCLEAR MEDICINE GASTROINTESTINAL BLEEDING SCAN TECHNIQUE: Sequential abdominal images were obtained following intravenous administration of Tc-3125m labeled red blood cells. RADIOPHARMACEUTICALS:  25.8 mCi Tc-7325m in-vitro labeled red cells. COMPARISON:  CT abdomen/ pelvis dated 03/15/2016 FINDINGS: Within 5 minutes, there is increased radiotracer within the splenic flexure of the colon, indicating active GI bleeding. There is propagation into the descending colon during the course of the study, along with retrograde flow along the transverse colon at 55-60 minutes. The study was aborted at 60 minutes. IMPRESSION: Active GI bleeding localized to the splenic flexure of the colon. These results were called by telephone at the time of interpretation on 04/19/2016 at 9:19 am to Dr. Irish LackGlenn Yamagata, who verbally  acknowledged these results. Electronically Signed   By: Charline BillsSriyesh  Krishnan M.D.   On: 04/19/2016 09:19   [employer]  Result Date: 04/01/2016 Procedure: Real-time Ultrasound Guided Injection of right glenohumeral joint Device: GE Logiq E Ultrasound guided injection is preferred based studies that show increased duration, increased effect, greater accuracy, decreased procedural pain, increased response rate with ultrasound guided versus blind injection. Verbal informed consent obtained. Time-out conducted. Noted  no overlying erythema, induration, or other signs of local infection. Skin prepped in a sterile fashion. Local anesthesia: Topical Ethyl chloride. With sterile technique and under real time ultrasound guidance:  Joint visualized.  23g 1  inch needle inserted posterior approach. Pictures taken for needle placement. Patient did have injection of 2 cc of 1% lidocaine, 2 cc of 0.5% Marcaine, and 1.0 cc of Kenalog 40 mg/dL. Completed without difficulty Pain immediately resolved suggesting accurate placement of the medication. Advised to call if fevers/chills, erythema, induration, drainage, or persistent bleeding. Images permanently stored and available for review in the ultrasound unit. Impression: Technically successful ultrasound guided injection.   Procedure: Real-time Ultrasound Guided Injection of left glenohumeral joint Device: GE Logiq E Ultrasound guided injection is preferred based studies that show increased duration, increased effect, greater accuracy, decreased procedural pain, increased response rate with ultrasound guided versus blind injection. Verbal informed consent obtained. Time-out conducted. Noted no overlying erythema, induration, or other signs of local infection. Skin prepped in a sterile fashion. Local anesthesia: Topical Ethyl chloride. With sterile technique and under real time ultrasound guidance:  Joint visualized.  23g 1  inch needle inserted posterior approach.  Pictures taken for needle placement. Patient did have injection of 2 cc of 1% lidocaine, 2 cc of 0.5% Marcaine, and 1cc of Kenalog 40 mg/dL. Completed without difficulty Pain immediately resolved suggesting accurate placement of the medication. Advised to call if fevers/chills, erythema, induration, drainage, or persistent bleeding. Images permanently stored and available for review in the ultrasound unit. Impression: Technically successful ultrasound guided injection    CBC  Recent Labs Lab 04/16/16 1546  04/19/16 0933 04/19/16 1816 04/20/16 0537 04/20/16 1052 04/21/16 0413  WBC 12.9*  < > 13.0* 15.3* 13.5* 12.7* 11.0*  HGB 9.3*  < > 6.9* 10.6* 9.7* 9.4* 8.6*  HCT 28.4*  < > 20.4* 30.8* 27.5* 27.1* 24.9*  PLT 390.0  < > 208 208 206 214 215  MCV 84.9  < > 86.1 85.3 84.9 85.5 87.1  MCH  --   < > 29.1 29.4 29.9 29.7 30.1  MCHC 32.9  < > 33.8 34.4 35.3 34.7 34.5  RDW 18.5*  < > 15.8* 14.8 15.4 15.6* 16.1*  LYMPHSABS 1.3  --   --   --   --   --   --   MONOABS 0.3  --   --   --   --   --   --   EOSABS 0.2  --   --   --   --   --   --   BASOSABS 0.0  --   --   --   --   --   --   < > = values in this interval not displayed.  Chemistries   Recent Labs Lab 04/19/16 0112 04/19/16 0116 04/20/16 0537 04/21/16 0413  NA 142 143 144 145  K 3.3* 3.5 3.6 3.3*  CL 108 107 111 111  CO2 25  --  26 27  GLUCOSE 123* 122* 90 100*  BUN 13 16 18 14   CREATININE 0.70 0.70 0.75 0.71  CALCIUM 8.4*  --  8.2* 8.3*  AST 21  --   --   --   ALT 14  --   --   --   ALKPHOS 58  --   --   --   BILITOT 0.2*  --   --   --    ------------------------------------------------------------------------------------------------------------------ No results for input(s): CHOL, HDL, LDLCALC, TRIG, CHOLHDL, LDLDIRECT  in the last 72 hours.  Lab Results  Component Value Date   HGBA1C 5.2 02/23/2016    ------------------------------------------------------------------------------------------------------------------ No results for input(s): TSH, T4TOTAL, T3FREE, THYROIDAB in the last 72 hours.  Invalid input(s): FREET3 ------------------------------------------------------------------------------------------------------------------ No results for input(s): VITAMINB12, FOLATE, FERRITIN, TIBC, IRON, RETICCTPCT in the last 72 hours.  Coagulation profile  Recent Labs Lab 04/19/16 0132 04/19/16 0933 04/19/16 1816  INR 3.32 1.60 1.33    No results for input(s): DDIMER in the last 72 hours.  Cardiac Enzymes No results for input(s): CKMB, TROPONINI, MYOGLOBIN in the last 168 hours.  Invalid input(s): CK ------------------------------------------------------------------------------------------------------------------ No results found for: BNP   Tracy [name] M.D on 04/21/2016 at 9:06 AM  Between 7am to 7pm - Pager - [phone]  After 7pm go to [email or web address] - password [email or web address]  [facility] -  Office  [phone]  Dragon dictation system was used to create this note, attempts have been made to correct errors, however presence of uncorrected errors is not a reflection quality of care provided

## 2016-04-22 LAB — CBC
HEMATOCRIT: 26 % — AB (ref 36.0–46.0)
Hemoglobin: 8.6 g/dL — ABNORMAL LOW (ref 12.0–15.0)
MCH: 29.6 pg (ref 26.0–34.0)
MCHC: 33.1 g/dL (ref 30.0–36.0)
MCV: 89.3 fL (ref 78.0–100.0)
Platelets: 237 10*3/uL (ref 150–400)
RBC: 2.91 MIL/uL — AB (ref 3.87–5.11)
RDW: 16.5 % — ABNORMAL HIGH (ref 11.5–15.5)
WBC: 9.9 10*3/uL (ref 4.0–10.5)

## 2016-04-22 MED ORDER — FERROUS SULFATE 325 (65 FE) MG PO TABS: ORAL_TABLET | ORAL | 3 refills | Status: DC

## 2016-04-22 MED ORDER — ASPIRIN 81 MG PO TABS: ORAL_TABLET | ORAL | 1 refills | Status: DC

## 2016-04-22 MED ORDER — POLYETHYLENE GLYCOL 3350 17 GM/SCOOP PO POWD: ORAL | 3 refills | Status: DC

## 2016-04-22 MED ORDER — PANTOPRAZOLE SODIUM 40 MG PO TBEC: 40.0000 mg | DELAYED_RELEASE_TABLET | Freq: Two times a day (BID) | ORAL | 3 refills | Status: DC

## 2016-04-22 MED ORDER — ATORVASTATIN CALCIUM 10 MG PO TABS: ORAL_TABLET | ORAL | 3 refills | Status: DC

## 2016-04-22 NOTE — Progress Notes (Signed)
     [facility] Progress Note  Chief Complaint:   GI bleed  Subjective: Feels okay, no BMs / no bleeding today. Didn't eat much breakfast  Objective:  Vital signs in last 24 hours: Temp:  [97.9 F (36.6 C)-98.3 F (36.8 C)] 98.1 F (36.7 C) (01/18 0756) Pulse Rate:  [75-94] 86 (01/18 0756) Resp:  [19-27] 22 (01/18 0756) BP: (122-142)/(57-84) 123/68 (01/18 0756) SpO2:  [90 %-98 %] 95 % (01/18 0756) Weight:  [98 lb 1.6 oz (44.5 kg)] 98 lb 1.6 oz (44.5 kg) (01/18 0523) Last BM Date: 04/20/16 General:   Alert, petite black female in NAD EENT:  Normal hearing, non icteric sclera, conjunctive pink.  Heart:  Regular rate, irregular rhythm, no lower extremity edema Pulm: Normal respiratory effort Abdomen:  Soft, nondistended, nontender.  Normal bowel sounds, no masses felt. No hepatomegaly.    Neurologic:  Alert and  oriented x4;  grossly normal neurologically. Psych:  Alert and cooperative. Normal mood and affect.  Lab Results:  Recent Labs  04/21/16 0413 04/21/16 1410 04/22/16 0420  WBC 11.0* 11.3* 9.9  HGB 8.6* 9.2* 8.6*  HCT 24.9* 27.0* 26.0*  PLT 215 252 237   BMET  Recent Labs  04/20/16 0537 04/21/16 0413  NA 144 145  K 3.6 3.3*  CL 111 111  CO2 26 27  GLUCOSE 90 100*  BUN 18 14  CREATININE 0.75 0.71  CALCIUM 8.2* 8.3*   PT/INR  Recent Labs  04/19/16 1816 04/21/16 1410  LABPROT 16.6* 14.2  INR 1.33 1.10     Assessment / Plan:  1. 83 yo female with GI in on coumadin + ASA. Likely diverticular hemorrhage. RESOLVED. Bleeding scan + active bleeding in splenic flexure. IR evaluated but at increased risk for angiography / embolization given her anatomy. She is stable for discharge from GI standpoint. Recommend coumadin not be restarted right now. PCP can weigh the risks / benefits of long term antiicoagulation in the patient.   2. ABL. Required 4 units of blood. Hgb stable at 8.6.    Principal Problem:   Acute GI bleeding Active  Problems:   Essential hypertension   Atrial fibrillation with RVR (HCC)   Acute blood loss anemia   BRBPR (bright red blood per rectum)   LOS: 3 days   Willette ClusterPaula Brelyn Woehl  NP 04/22/2016, 9:07 AM  Pager number [phone]    \

## 2016-04-22 NOTE — Discharge Summary (Signed)
Tracy [name], is a 83 y.o. female  DOB [birth year]  MRN [id number].  Admission date:  04/19/2016  Admitting Physician  Hillary Bow, DO  Discharge Date:  04/22/2016   Primary MD  Oliver Barre, MD  Recommendations for primary care physician for things to follow:   Repeat CBC within 1 week  Talk to your primary doctor about the risk versus benefits of full anticoagulation with Coumadin given recent episodes of GI blood loss  Admission Diagnosis  Acute GI bleeding [K92.2] BRBPR (bright red blood per rectum) [K62.5]   Discharge Diagnosis  Acute GI bleeding [K92.2] BRBPR (bright red blood per rectum) [K62.5]    Principal Problem:   Acute GI bleeding Active Problems:   Essential hypertension   Atrial fibrillation with RVR (HCC)   Acute blood loss anemia   BRBPR (bright red blood per rectum)      Past Medical History:  Diagnosis Date  . Atrial fibrillation (HCC)   . CAROTID STENOSIS   . CVA (cerebrovascular accident) (HCC) 2002  . DIABETES MELLITUS, TYPE II   . DIVERTICULOSIS, COLON   . DM (diabetes mellitus), type 2 (HCC)   . DVT (deep venous thrombosis) (HCC)   . Gastric ulcer   . GERD   . HTN (hypertension)   . Hx of adenomatous colonic polyps 2007  . Hyperlipidemia   . Hypertension   . OSTEOPENIA   . PVD (peripheral vascular disease) (HCC)    recently dx w/ carotid -left  int carotid >90% 50-60% rt int carotid 02/2010  . PVD (peripheral vascular disease) (HCC)    lf  ABI 0.11 and rt 0.43  . Sinus node dysfunction (HCC) 2011  . Stroke [facility]) July 01, 2014   Affected Left side  . Upper GI bleed 02/2016    Past Surgical History:  Procedure Laterality Date  . CAROTID ENDARTERECTOMY    . cataract eye surgery    . CNS vascular stent per Neurosurgeon    . ESOPHAGOGASTRODUODENOSCOPY (EGD) WITH PROPOFOL N/A 02/24/2016   Procedure: ESOPHAGOGASTRODUODENOSCOPY (EGD) WITH PROPOFOL;   Surgeon: Kathi Der, MD;  Location: [facility] ENDOSCOPY;  Service: Gastroenterology;  Laterality: N/A;  . EYE SURGERY    . ILIAC ARTERY STENT  2011   Bilateral CIA stent  . lazer eye surgery    . RADIOLOGY WITH ANESTHESIA N/A 03/20/2016   Procedure: MRI OF BRAIN;  Surgeon: Medication Radiologist, MD;  Location: [facility] OR;  Service: Radiology;  Laterality: N/A;  . Right Shoulder Surgery  2010  . TUBAL LIGATION         HPI  from the history and physical done on the day of admission:      HPI: Tracy [name] is a 83 y.o. female with medical history significant of A.Fib, CVA, on coumadin.  Patient admitted in Nov with iron def anemia.  Re admitted in Dec with UGIB due to gastric ulcer and ulcer at [facility] with large hiatal hernia on 11/20.   Also had a Dec hospital stay for Delirium.  Coumadin resumed 10 days ago for A.Fib.  Patient presents to the ED today with persistent abdominal pain for past 3 days and associated hematochezia that started this evening.  Patient with 4 bright red blood BMs with clots at home.  Had ~3 more since arrival to ED.  HGB is down to 7.8 from 9.3 just 3 days ago on a GI office follow up visit.  Nothing makes symptoms better or worse, symptoms are persistent and severe.  Dr. Russella DarStark was called by EDP: he plans to see patient first thing in the morning, call him back if BP drops.  Patient getting 2 units PRBC and 2 units FFP transfused.  INR is 3.x    Hospital Course:    Interval history 83 y/o female with history of Afib and CVA, with history of bleeding gastric ulcer in November 2017, Coumadin was held and then resumed about 10 days prior to this admission. She  presented again now with lower GI bleeding (bright red blood per rectum and abdominal pain) in the setting of coumadin and aspirin. Tagged RBC scan showed active bleeding in the splenic flexure of the colon, likely diverticular bleed. IR consulted, given her anatomy she is higher than average risk for  angiography / embolization and this was deferred. Coumadin was reversed with FFP. She does not appear to have any had significant bleeding since that time although.  Hgb is stable,  Given her history of GI bleeding, would recommend holding off on aggressive anticoagulation at this time, but defer long term decision to primary provider as she has had history of CVA.   Symptoms patient had upper GI bleeding from gastric ulcer in November 2017 and now patient has lower GI bleeding of diverticular origin  Consultants: Gastroenterology. Interventional radiology.  Plan:- 1)Hematochezia -Most probably diverticular. Nuclear medicine scan did show bleeding in the splenic flexure. GI and interventional radiology consults appreciated.  IR consulted, given her anatomy she is higher than average risk for angiography / embolization and this was deferred. Patient and family elected to wait and watch rather than undergoing any procedure/angiogram. Bleeding appears to have resolved with reversal of warfarin. Gastroenterology input appreciated patient's tolerating solid diet well  2)Acute blood loss anemia- Patient was aggressively transfused. Hemoglobin is now stable.   3)Coagulopathy- Due to warfarin, INR was 3.2 on admission,  INR has been aggressively reversed.  4)Atrial fibrillation with RVR- patient is off Cardizem drip now okay to continue oral metoprolol and oral Cardizem, Warfarin has been held due to bleed. CHADS-VASc is at least 5.  5)History of stroke- Coumadin is on hold due to recurrent GI bleed  6)History of gastric ulcer- Does not seem to be an active issue at this time. PPI.   DVT Prophylaxis: SCDs    Code Status: DO NOT RESUSCITATE        Discharge Condition: stable H and H and stable BP  Follow UP.Marland Kitchen.Marland Kitchen. 1)Stop Coumadin, okay to take baby aspirin with food every day  2)Talk to your primary doctor about the risk versus benefits of full anticoagulation with Coumadin given recent  episodes of GI blood loss  3) call or return if dizziness shortness of breath or bloody stools  4) repeat complete blood count with PCP within a week  5) repeat EGD endoscopy with gastroenterologist in 4-6 weeks  Follow-up Information    [facility] Follow up.   Why:  Physical Therapy Contact information: [address] [place] [id number] [phone]  Oliver Barre, MD. Schedule an appointment as soon as possible for a visit in 1 week(s).   Specialties:  Internal Medicine, Radiology Why:  Recheck CBC with PCP within 1 week Contact information: [address] [place] [facility] [place] [address] [phone]        Ruffin Frederick, MD. Schedule an appointment as soon as possible for a visit in 4 week(s).   Specialty:  Gastroenterology Why:  4-6 weeks for repeat EGD Contact information: [address] [place] [address] [phone]            Consults obtained - Gi and IR  Diet and Activity recommendation:  As advised  Discharge Instructions    Discharge Instructions    Diet - low sodium heart healthy    Complete by:  As directed    Discharge instructions    Complete by:  As directed    1)Stop Coumadin, okay to take baby aspirin with food every day  2)Talk to your primary doctor about the risk versus benefits of full anticoagulation with Coumadin given recent episodes of GI blood loss  3) call or return if dizziness shortness of breath or bloody stools  4) repeat complete blood count with PCP within a week  5) repeat EGD endoscopy with gastroenterologist in 4-6 weeks   Increase activity slowly    Complete by:  As directed       Discharge Medications     Allergies as of 04/22/2016   No Known Allergies     Medication List    STOP taking these medications   docusate sodium 100 MG capsule Commonly known as:  COLACE     TAKE these medications   amLODipine 5 MG tablet Commonly known as:  NORVASC Take 5  mg by mouth daily.   aspirin 81 MG tablet 1 tablet daily with food What changed:  medication strength  how much to take  how to take this  when to take this  additional instructions   atorvastatin 10 MG tablet Commonly known as:  LIPITOR 1/2 tab by mouth daily What changed:  how much to take  how to take this  when to take this  additional instructions   diltiazem 180 MG 24 hr capsule Commonly known as:  CARDIZEM CD Take 1 capsule (180 mg total) by mouth daily.   donepezil 5 MG tablet Commonly known as:  ARICEPT Take 1 tablet (5 mg total) by mouth at bedtime.   feeding supplement (ENSURE ENLIVE) Liqd Take 237 mLs by mouth 2 (two) times daily between meals.   fenofibrate micronized 134 MG capsule Commonly known as:  LOFIBRA TAKE 1 CAPSULE (134 MG TOTAL) BY MOUTH DAILY.   ferrous sulfate 325 (65 FE) MG tablet 1 twice a day   metoprolol tartrate 25 MG tablet Commonly known as:  LOPRESSOR Take 0.5 tablets (12.5 mg total) by mouth 2 (two) times daily.   multivitamin with minerals Tabs tablet Take 1 tablet by mouth daily.   OLANZapine 2.5 MG tablet Commonly known as:  ZYPREXA Take 2.5 mg by mouth daily.   oxybutynin 5 MG 24 hr tablet Commonly known as:  DITROPAN-XL TAKE 1 TABLET BY MOUTH EVERY DAY   pantoprazole 40 MG tablet Commonly known as:  PROTONIX Take 1 tablet (40 mg total) by mouth 2 (two) times daily before a meal.   pentoxifylline 400 MG CR tablet Commonly known as:  TRENTAL TAKE 1 TABLET (400 MG TOTAL) BY MOUTH DAILY.   polyethylene  glycol powder powder Commonly known as:  GLYCOLAX/MIRALAX Mix 17g of Miralax in 8oz of water twice daily.   PRESERVISION AREDS 2 Caps Take 1 capsule by mouth 2 (two) times daily.      Major procedures and Radiology Reports - PLEASE review detailed and final reports for all details, in brief -    Nm Gi Blood Loss  Result Date: 04/19/2016 CLINICAL DATA:  Upper GI bleed, diverticulosis, on Coumadin,  abdominal pain EXAM: NUCLEAR MEDICINE GASTROINTESTINAL BLEEDING SCAN TECHNIQUE: Sequential abdominal images were obtained following intravenous administration of Tc-77m labeled red blood cells. RADIOPHARMACEUTICALS:  25.8 mCi Tc-46m in-vitro labeled red cells. COMPARISON:  CT abdomen/ pelvis dated 03/15/2016 FINDINGS: Within 5 minutes, there is increased radiotracer within the splenic flexure of the colon, indicating active GI bleeding. There is propagation into the descending colon during the course of the study, along with retrograde flow along the transverse colon at 55-60 minutes. The study was aborted at 60 minutes. IMPRESSION: Active GI bleeding localized to the splenic flexure of the colon. These results were called by telephone at the time of interpretation on 04/19/2016 at 9:19 am to Dr. Irish Lack, who verbally acknowledged these results. Electronically Signed   By: Charline Bills M.D.   On: 04/19/2016 09:19   [employer]  Result Date: 04/01/2016 Procedure: Real-time Ultrasound Guided Injection of right glenohumeral joint Device: GE Logiq E Ultrasound guided injection is preferred based studies that show increased duration, increased effect, greater accuracy, decreased procedural pain, increased response rate with ultrasound guided versus blind injection. Verbal informed consent obtained. Time-out conducted. Noted no overlying erythema, induration, or other signs of local infection. Skin prepped in a sterile fashion. Local anesthesia: Topical Ethyl chloride. With sterile technique and under real time ultrasound guidance:  Joint visualized.  23g 1  inch needle inserted posterior approach. Pictures taken for needle placement. Patient did have injection of 2 cc of 1% lidocaine, 2 cc of 0.5% Marcaine, and 1.0 cc of Kenalog 40 mg/dL. Completed without difficulty Pain immediately resolved suggesting accurate placement of the medication. Advised to call if fevers/chills, erythema,  induration, drainage, or persistent bleeding. Images permanently stored and available for review in the ultrasound unit. Impression: Technically successful ultrasound guided injection.   Procedure: Real-time Ultrasound Guided Injection of left glenohumeral joint Device: GE Logiq E Ultrasound guided injection is preferred based studies that show increased duration, increased effect, greater accuracy, decreased procedural pain, increased response rate with ultrasound guided versus blind injection. Verbal informed consent obtained. Time-out conducted. Noted no overlying erythema, induration, or other signs of local infection. Skin prepped in a sterile fashion. Local anesthesia: Topical Ethyl chloride. With sterile technique and under real time ultrasound guidance:  Joint visualized.  23g 1  inch needle inserted posterior approach. Pictures taken for needle placement. Patient did have injection of 2 cc of 1% lidocaine, 2 cc of 0.5% Marcaine, and 1cc of Kenalog 40 mg/dL. Completed without difficulty Pain immediately resolved suggesting accurate placement of the medication. Advised to call if fevers/chills, erythema, induration, drainage, or persistent bleeding. Images permanently stored and available for review in the ultrasound unit. Impression: Technically successful ultrasound guided injection  Micro Results    Recent Results (from the past 240 hour(s))  MRSA PCR Screening     Status: None   Collection Time: 04/19/16 10:03 AM  Result Value Ref Range Status   MRSA by PCR NEGATIVE NEGATIVE Final    Comment:        The GeneXpert MRSA  Assay (FDA approved for NASAL specimens only), is one component of a comprehensive MRSA colonization surveillance program. It is not intended to diagnose MRSA infection nor to guide or monitor treatment for MRSA infections.     Today   Subjective    Tracy [name] today has no new Complaints         Patient has been seen and examined prior to discharge     Objective   Blood pressure 123/68, pulse 86, temperature 98.1 F (36.7 C), temperature source Oral, resp. rate (!) 22, height 4\' 11"  (1.499 m), weight 44.5 kg (98 lb 1.6 oz), SpO2 95 %.   Intake/Output Summary (Last 24 hours) at 04/22/16 1343 Last data filed at 04/22/16 0400  Gross per 24 hour  Intake              180 ml  Output                0 ml  Net              180 ml    Exam Gen:- Awake  In no apparent distress  HEENT:- [place].AT,   Neck-Supple Neck,No JVD,  Lungs- mostly clear  CV- S1, S2 normal, irregular Abd-  +ve B.Sounds, Abd Soft, No tenderness,    Extremity/Skin:- Intact peripheral pulses     Data Review   CBC w Diff:  Lab Results  Component Value Date   WBC 9.9 04/22/2016   HGB 8.6 (L) 04/22/2016   HCT 26.0 (L) 04/22/2016   PLT 237 04/22/2016   LYMPHOPCT 9.9 (L) 04/16/2016   MONOPCT 2.4 (L) 04/16/2016   EOSPCT 1.8 04/16/2016   BASOPCT 0.3 04/16/2016    CMP:  Lab Results  Component Value Date   NA 145 04/21/2016   K 3.3 (L) 04/21/2016   CL 111 04/21/2016   CO2 27 04/21/2016   BUN 14 04/21/2016   CREATININE 0.71 04/21/2016   PROT 5.0 (L) 04/19/2016   ALBUMIN 2.1 (L) 04/19/2016   BILITOT 0.2 (L) 04/19/2016   ALKPHOS 58 04/19/2016   AST 21 04/19/2016   ALT 14 04/19/2016  .   Total Discharge time is about 33 minutes  Daymond Cordts M.D on 04/22/2016 at 1:43 PM  [facility]   Office  [phone]  Dragon dictation system was used to create this note, attempts have been made to correct errors, however presence of uncorrected errors is not a reflection quality of care provided

## 2016-04-22 NOTE — Care Management Note (Signed)
Case Management Note  Patient Details  Name: Tracy [name] MRN: [id number] Date of Birth: 1927/01/11  Subjective/Objective:   Pt presented from Acute I Bleed. Pt has son at bedside. PT recommendations for SNF. Pt/son is refusing SNF @ this time.                   Action/Plan: CM did speak with pt and son in regards to SNF. Pt and son refusing SNF. Per son pt was not receiving therapy as needed. Pt has DME RW, 3n1 and Wheelchair. No DME needs at this time.  [facility] Pt states she had used [facility] in the past and wants to use [facility]. [facility] to begin within 24-48 hours post d/c. CM did call [facility] in regards to see if patient was active. Pt is not active with [facility]. Plan will be to d/c home today and son to transport. MD to place orders. No further needs from CM @ this time.   Expected Discharge Date:                  Expected Discharge Plan:  Home w Home Health Services  In-House Referral:  Clinical Social Work  Discharge planning Services  CM Consult  Post Acute Care Choice:  Home Health Choice offered to:  Patient, Adult Children  DME Arranged:  N/A DME Agency:  NA  HH Arranged:  PT HH Agency:  [facility]  Status of Service:  Completed, signed off  If discussed at Long Length of Stay Meetings, dates discussed:    Additional Comments:  Gala LewandowskyGraves-Bigelow, Jera Headings Kaye, RN 04/22/2016, 11:40 AM

## 2016-04-23 ENCOUNTER — Telehealth: Payer: Self-pay

## 2016-04-23 ENCOUNTER — Telehealth: Payer: Self-pay | Admitting: *Deleted

## 2016-04-23 LAB — TYPE AND SCREEN
BLOOD PRODUCT EXPIRATION DATE: 201801232359
BLOOD PRODUCT EXPIRATION DATE: 201801242359
BLOOD PRODUCT EXPIRATION DATE: 201801252359
Blood Product Expiration Date: 201801232359
Blood Product Expiration Date: 201801232359
Blood Product Expiration Date: 201801242359
ISSUE DATE / TIME: 201801122332
ISSUE DATE / TIME: 201801150145
ISSUE DATE / TIME: 201801150145
ISSUE DATE / TIME: 201801150236
ISSUE DATE / TIME: 201801151127
ISSUE DATE / TIME: 201801151412
UNIT TYPE AND RH: 9500
UNIT TYPE AND RH: 9500
UNIT TYPE AND RH: 9500
UNIT TYPE AND RH: 9500
Unit Type and Rh: 9500
Unit Type and Rh: 9500

## 2016-04-23 NOTE — Telephone Encounter (Signed)
-----   Message from Ruffin FrederickSteven Paul Armbruster, MD sent at 04/22/2016 12:34 PM EST ----- Raynelle FanningJulie can you please call [name] tomorrow or next week. She is scheduled for EGD with me next week but we are going to cancel it, she was admitted for diverticular bleed this week and is weak. Can you call her to reschedule this for another few weeks down the road? Thanks

## 2016-04-23 NOTE — Telephone Encounter (Signed)
Transition Care Management Follow-up Telephone Call   Date discharged? 04/22/16   How have you been since you were released from the hospital? Called pt spoke w/son he states mom seem to be doing ok   Do you understand why you were in the hospital? YES   Do you understand the discharge instructions? YES   Where were you discharged to? Home   Items Reviewed:  Medications reviewed: YES  Allergies reviewed: YES  Dietary changes reviewed: NO  Referrals reviewed: No referral needed   Functional Questionnaire:   Activities of Daily Living (ADLs):   He states she are independent in the following: ambulation, bathing and hygiene, feeding, continence, grooming, toileting and dressing States she require assistance with the following: ambulation   Any transportation issues/concerns?: NO   Any patient concerns? NO   Confirmed importance and date/time of follow-up visits scheduled YES, appt 04/29/16  Provider Appointment booked with Dr. Jonny RuizJohn  Confirmed with patient if condition begins to worsen call PCP or go to the ER.  Patient was given the office number and encouraged to call back with question or concerns.  : YES

## 2016-04-23 NOTE — Telephone Encounter (Signed)
Patient rescheduled for EGD from 1/23 to 2/19. Confirmed appointment with son, patient is also aware. Mailed new prep/date/time instructions.

## 2016-04-23 NOTE — Progress Notes (Signed)
Prep letter mailed. 

## 2016-04-24 DIAGNOSIS — K5730 Diverticulosis of large intestine without perforation or abscess without bleeding: Secondary | ICD-10-CM | POA: Diagnosis not present

## 2016-04-24 DIAGNOSIS — E1122 Type 2 diabetes mellitus with diabetic chronic kidney disease: Secondary | ICD-10-CM | POA: Diagnosis not present

## 2016-04-24 DIAGNOSIS — Z8673 Personal history of transient ischemic attack (TIA), and cerebral infarction without residual deficits: Secondary | ICD-10-CM | POA: Diagnosis not present

## 2016-04-24 DIAGNOSIS — I272 Pulmonary hypertension, unspecified: Secondary | ICD-10-CM | POA: Diagnosis not present

## 2016-04-24 DIAGNOSIS — I130 Hypertensive heart and chronic kidney disease with heart failure and stage 1 through stage 4 chronic kidney disease, or unspecified chronic kidney disease: Secondary | ICD-10-CM | POA: Diagnosis not present

## 2016-04-24 DIAGNOSIS — I4891 Unspecified atrial fibrillation: Secondary | ICD-10-CM | POA: Diagnosis not present

## 2016-04-24 DIAGNOSIS — E1151 Type 2 diabetes mellitus with diabetic peripheral angiopathy without gangrene: Secondary | ICD-10-CM | POA: Diagnosis not present

## 2016-04-24 DIAGNOSIS — I5032 Chronic diastolic (congestive) heart failure: Secondary | ICD-10-CM | POA: Diagnosis not present

## 2016-04-24 DIAGNOSIS — D500 Iron deficiency anemia secondary to blood loss (chronic): Secondary | ICD-10-CM | POA: Diagnosis not present

## 2016-04-24 DIAGNOSIS — N183 Chronic kidney disease, stage 3 (moderate): Secondary | ICD-10-CM | POA: Diagnosis not present

## 2016-04-24 DIAGNOSIS — D62 Acute posthemorrhagic anemia: Secondary | ICD-10-CM | POA: Diagnosis not present

## 2016-04-24 DIAGNOSIS — E1140 Type 2 diabetes mellitus with diabetic neuropathy, unspecified: Secondary | ICD-10-CM | POA: Diagnosis not present

## 2016-04-24 DIAGNOSIS — E785 Hyperlipidemia, unspecified: Secondary | ICD-10-CM | POA: Diagnosis not present

## 2016-04-27 ENCOUNTER — Encounter: Payer: Medicare Other | Admitting: Gastroenterology

## 2016-04-27 DIAGNOSIS — I272 Pulmonary hypertension, unspecified: Secondary | ICD-10-CM | POA: Diagnosis not present

## 2016-04-27 DIAGNOSIS — E785 Hyperlipidemia, unspecified: Secondary | ICD-10-CM | POA: Diagnosis not present

## 2016-04-27 DIAGNOSIS — D500 Iron deficiency anemia secondary to blood loss (chronic): Secondary | ICD-10-CM | POA: Diagnosis not present

## 2016-04-27 DIAGNOSIS — K5730 Diverticulosis of large intestine without perforation or abscess without bleeding: Secondary | ICD-10-CM | POA: Diagnosis not present

## 2016-04-27 DIAGNOSIS — D62 Acute posthemorrhagic anemia: Secondary | ICD-10-CM | POA: Diagnosis not present

## 2016-04-27 DIAGNOSIS — I130 Hypertensive heart and chronic kidney disease with heart failure and stage 1 through stage 4 chronic kidney disease, or unspecified chronic kidney disease: Secondary | ICD-10-CM | POA: Diagnosis not present

## 2016-04-27 DIAGNOSIS — I5032 Chronic diastolic (congestive) heart failure: Secondary | ICD-10-CM | POA: Diagnosis not present

## 2016-04-27 DIAGNOSIS — E1151 Type 2 diabetes mellitus with diabetic peripheral angiopathy without gangrene: Secondary | ICD-10-CM | POA: Diagnosis not present

## 2016-04-27 DIAGNOSIS — E1140 Type 2 diabetes mellitus with diabetic neuropathy, unspecified: Secondary | ICD-10-CM | POA: Diagnosis not present

## 2016-04-27 DIAGNOSIS — N183 Chronic kidney disease, stage 3 (moderate): Secondary | ICD-10-CM | POA: Diagnosis not present

## 2016-04-27 DIAGNOSIS — E1122 Type 2 diabetes mellitus with diabetic chronic kidney disease: Secondary | ICD-10-CM | POA: Diagnosis not present

## 2016-04-27 DIAGNOSIS — Z8673 Personal history of transient ischemic attack (TIA), and cerebral infarction without residual deficits: Secondary | ICD-10-CM | POA: Diagnosis not present

## 2016-04-27 DIAGNOSIS — I4891 Unspecified atrial fibrillation: Secondary | ICD-10-CM | POA: Diagnosis not present

## 2016-04-28 ENCOUNTER — Other Ambulatory Visit: Payer: Self-pay

## 2016-04-28 DIAGNOSIS — I739 Peripheral vascular disease, unspecified: Secondary | ICD-10-CM

## 2016-04-29 ENCOUNTER — Ambulatory Visit (INDEPENDENT_AMBULATORY_CARE_PROVIDER_SITE_OTHER): Payer: Medicare Other | Admitting: Internal Medicine

## 2016-04-29 ENCOUNTER — Encounter: Payer: Self-pay | Admitting: Internal Medicine

## 2016-04-29 VITALS — BP 118/64 | HR 55 | Temp 98.0°F | Resp 20 | Wt 98.0 lb

## 2016-04-29 DIAGNOSIS — I272 Pulmonary hypertension, unspecified: Secondary | ICD-10-CM | POA: Diagnosis not present

## 2016-04-29 DIAGNOSIS — I10 Essential (primary) hypertension: Secondary | ICD-10-CM

## 2016-04-29 DIAGNOSIS — E1140 Type 2 diabetes mellitus with diabetic neuropathy, unspecified: Secondary | ICD-10-CM | POA: Diagnosis not present

## 2016-04-29 DIAGNOSIS — I5032 Chronic diastolic (congestive) heart failure: Secondary | ICD-10-CM

## 2016-04-29 DIAGNOSIS — Z8673 Personal history of transient ischemic attack (TIA), and cerebral infarction without residual deficits: Secondary | ICD-10-CM | POA: Diagnosis not present

## 2016-04-29 DIAGNOSIS — D509 Iron deficiency anemia, unspecified: Secondary | ICD-10-CM

## 2016-04-29 DIAGNOSIS — Z7901 Long term (current) use of anticoagulants: Secondary | ICD-10-CM | POA: Insufficient documentation

## 2016-04-29 DIAGNOSIS — I4891 Unspecified atrial fibrillation: Secondary | ICD-10-CM | POA: Diagnosis not present

## 2016-04-29 DIAGNOSIS — E1122 Type 2 diabetes mellitus with diabetic chronic kidney disease: Secondary | ICD-10-CM | POA: Diagnosis not present

## 2016-04-29 DIAGNOSIS — E1151 Type 2 diabetes mellitus with diabetic peripheral angiopathy without gangrene: Secondary | ICD-10-CM | POA: Diagnosis not present

## 2016-04-29 DIAGNOSIS — D500 Iron deficiency anemia secondary to blood loss (chronic): Secondary | ICD-10-CM | POA: Diagnosis not present

## 2016-04-29 DIAGNOSIS — I130 Hypertensive heart and chronic kidney disease with heart failure and stage 1 through stage 4 chronic kidney disease, or unspecified chronic kidney disease: Secondary | ICD-10-CM | POA: Diagnosis not present

## 2016-04-29 DIAGNOSIS — N183 Chronic kidney disease, stage 3 (moderate): Secondary | ICD-10-CM | POA: Diagnosis not present

## 2016-04-29 DIAGNOSIS — K5730 Diverticulosis of large intestine without perforation or abscess without bleeding: Secondary | ICD-10-CM | POA: Diagnosis not present

## 2016-04-29 DIAGNOSIS — E785 Hyperlipidemia, unspecified: Secondary | ICD-10-CM | POA: Diagnosis not present

## 2016-04-29 DIAGNOSIS — D62 Acute posthemorrhagic anemia: Secondary | ICD-10-CM | POA: Diagnosis not present

## 2016-04-29 MED ORDER — HYDROCHLOROTHIAZIDE 12.5 MG PO CAPS: 12.5000 mg | ORAL_CAPSULE | Freq: Every day | ORAL | 3 refills | Status: DC | PRN

## 2016-04-29 NOTE — Assessment & Plan Note (Signed)
With some volume increase possibly related to recent hospn, for hct 12.5 qd prn,  to f/u any worsening symptoms or concerns

## 2016-04-29 NOTE — Progress Notes (Signed)
Pre visit review using our clinic review tool, if applicable. No additional management support is needed unless otherwise documented below in the visit note. 

## 2016-04-29 NOTE — Assessment & Plan Note (Signed)
D/w pt and husband, will hold on further anticoagulation in future due to apparently high risk recurrent GI bleeding

## 2016-04-29 NOTE — Progress Notes (Signed)
Subjective:    Patient ID: Tracy [name], female    DOB: 01-Feb-1927, 83 y.o.   MRN: [id number]  HPI  Here for TCM visit, hospd jan 15 - 18 with acute GI bleed/BRBPR, with hx of afib and CVA, bleeding gastric ulcer on coumadin.  Coumadin held with prior bleed nov 2017 then restarted 10 days prior to last admit.  She  presented again with lower GI bleeding (bright red blood per rectum and abdominal pain) in the setting of coumadin and aspirin. Tagged RBC scan showed active bleeding in the splenic flexure of the colon, likely diverticular bleed. IR consulted, given her anatomy she is higher than average risk for angiography / embolization and this was deferred. Coumadin was reversed with FFP.  Given her history of GI bleeding, coumadin help at d/c with decision on aggressive anticoagulation defered long term decision to primary provider as she has had history of CVA.   Course also complicated by afib with RVR requiring IV dilt, then changed to oral BB and diltiazem. S/p 2 units PRBC.  D/c Hgb: Lab Results  Component Value Date   HGB 8.6 (L) 04/22/2016  Denies worsening reflux, abd pain, dysphagia, n/v, bowel change or blood. Pt and husband decline increased asa, plavix or restart coumadin. Their main complaint today is bilat ankle edema new since recent hospn. Pt denies chest pain, increased sob or doe, wheezing, orthopnea, PND, palpitations, dizziness or syncope.  No other new hx Past Medical History:  Diagnosis Date  . Atrial fibrillation (HCC)   . CAROTID STENOSIS   . CVA (cerebrovascular accident) (HCC) 2002  . DIABETES MELLITUS, TYPE II   . DIVERTICULOSIS, COLON   . DM (diabetes mellitus), type 2 (HCC)   . DVT (deep venous thrombosis) (HCC)   . Gastric ulcer   . GERD   . HTN (hypertension)   . Hx of adenomatous colonic polyps 2007  . Hyperlipidemia   . Hypertension   . OSTEOPENIA   . PVD (peripheral vascular disease) (HCC)    recently dx w/ carotid -left  int carotid >90% 50-60% rt  int carotid 02/2010  . PVD (peripheral vascular disease) (HCC)    lf  ABI 0.11 and rt 0.43  . Sinus node dysfunction (HCC) 2011  . Stroke [facility]) July 01, 2014   Affected Left side  . Upper GI bleed 02/2016   Past Surgical History:  Procedure Laterality Date  . CAROTID ENDARTERECTOMY    . cataract eye surgery    . CNS vascular stent per Neurosurgeon    . ESOPHAGOGASTRODUODENOSCOPY (EGD) WITH PROPOFOL N/A 02/24/2016   Procedure: ESOPHAGOGASTRODUODENOSCOPY (EGD) WITH PROPOFOL;  Surgeon: Kathi Der, MD;  Location: [facility] ENDOSCOPY;  Service: Gastroenterology;  Laterality: N/A;  . EYE SURGERY    . ILIAC ARTERY STENT  2011   Bilateral CIA stent  . lazer eye surgery    . RADIOLOGY WITH ANESTHESIA N/A 03/20/2016   Procedure: MRI OF BRAIN;  Surgeon: Medication Radiologist, MD;  Location: [facility] OR;  Service: Radiology;  Laterality: N/A;  . Right Shoulder Surgery  2010  . TUBAL LIGATION      reports that she has quit smoking. She has never used smokeless tobacco. She reports that she does not drink alcohol or use drugs. family history includes Colon cancer in her mother; Diabetes in her sister. No Known Allergies Current Outpatient Prescriptions on File Prior to Visit  Medication Sig Dispense Refill  . amLODipine (NORVASC) 5 MG tablet Take 5 mg by mouth daily.    Marland Kitchen  aspirin 81 MG tablet 1 tablet daily with food 30 tablet 1  . atorvastatin (LIPITOR) 10 MG tablet 1/2 tab by mouth daily 45 tablet 3  . diltiazem (CARDIZEM CD) 180 MG 24 hr capsule Take 1 capsule (180 mg total) by mouth daily. 30 capsule 0  . donepezil (ARICEPT) 5 MG tablet Take 1 tablet (5 mg total) by mouth at bedtime.    . feeding supplement, ENSURE ENLIVE, (ENSURE ENLIVE) LIQD Take 237 mLs by mouth 2 (two) times daily between meals. 30 Bottle 1  . fenofibrate micronized (LOFIBRA) 134 MG capsule TAKE 1 CAPSULE (134 MG TOTAL) BY MOUTH DAILY. 90 capsule 3  . ferrous sulfate 325 (65 FE) MG tablet 1 twice a day 60 tablet 3  .  metoprolol tartrate (LOPRESSOR) 25 MG tablet Take 0.5 tablets (12.5 mg total) by mouth 2 (two) times daily. 60 tablet   . Multiple Vitamin (MULTIVITAMIN WITH MINERALS) TABS tablet Take 1 tablet by mouth daily.    . Multiple Vitamins-Minerals (PRESERVISION AREDS 2) CAPS Take 1 capsule by mouth 2 (two) times daily.    Marland Kitchen. OLANZapine (ZYPREXA) 2.5 MG tablet Take 2.5 mg by mouth daily.    Marland Kitchen. oxybutynin (DITROPAN-XL) 5 MG 24 hr tablet TAKE 1 TABLET BY MOUTH EVERY DAY 90 tablet 3  . pantoprazole (PROTONIX) 40 MG tablet Take 1 tablet (40 mg total) by mouth 2 (two) times daily before a meal. 60 tablet 3  . pentoxifylline (TRENTAL) 400 MG CR tablet TAKE 1 TABLET (400 MG TOTAL) BY MOUTH DAILY. 90 tablet 3  . polyethylene glycol powder (GLYCOLAX/MIRALAX) powder Mix 17g of Miralax in 8oz of water twice daily. 255 g 3   No current facility-administered medications on file prior to visit.    Review of Systems  Constitutional: Negative for unusual diaphoresis or night sweats HENT: Negative for ear swelling or discharge Eyes: Negative for worsening visual haziness  Respiratory: Negative for choking and stridor.   Gastrointestinal: Negative for distension or worsening eructation Genitourinary: Negative for retention or change in urine volume.  Musculoskeletal: Negative for other MSK pain or swelling Skin: Negative for color change and worsening wound Neurological: Negative for tremors and numbness other than noted  Psychiatric/Behavioral: Negative for decreased concentration or agitation other than above   All other system neg per pt    Objective:   Physical Exam BP 118/64   Pulse (!) 55   Temp 98 F (36.7 C) (Oral)   Resp 20   Wt 98 lb (44.5 kg)   SpO2 95%   BMI 19.79 kg/m  VS noted,  Constitutional: Pt appears in no apparent distress HENT: Head: NCAT.  Right Ear: External ear normal.  Left Ear: External ear normal.  Eyes: . Pupils are equal, round, and reactive to light. Conjunctivae and EOM  are normal Neck: Normal range of motion. Neck supple.  Cardiovascular: Normal rate and regular rhythm.   Pulmonary/Chest: Effort normal and breath sounds without rales or wheezing.  Abd:  Soft, NT, ND, + BS Neurological: Pt is alert. Not confused , motor grossly intact Skin: Skin is warm. No rash, has 1+ bilat distal leg and ankle LE edema Psychiatric: Pt behavior is normal. No agitation.    Transthoracic Echocardiography 02/24/2016 Impressions: - Normal LV systolic function; elevated LV filling pressure;   biatrial enlargement; calcified aortic valve with no AS by   doppler; moderate MR; mild TR with severely elevated pulmonary   pressure.     Assessment & Plan:

## 2016-04-29 NOTE — Patient Instructions (Signed)
Please take all new medication as prescribed - the mild fluid pill only if needed  Please continue all other medications as before, and refills have been done if requested.  Please have the pharmacy call with any other refills you may need.  Please keep your appointments with your specialists as you may have planned

## 2016-04-29 NOTE — Assessment & Plan Note (Signed)
Lab Results  Component Value Date   WBC 9.9 04/22/2016   HGB 8.6 (L) 04/22/2016   HCT 26.0 (L) 04/22/2016   MCV 89.3 04/22/2016   PLT 237 04/22/2016   Declines f/u lab today o/w stable

## 2016-04-29 NOTE — Assessment & Plan Note (Signed)
stable overall by history and exam, recent data reviewed with pt, and pt to continue medical treatment as before,  to f/u any worsening symptoms or concerns BP Readings from Last 3 Encounters:  04/29/16 118/64  04/22/16 123/68  04/16/16 120/68

## 2016-05-03 ENCOUNTER — Encounter: Payer: Self-pay | Admitting: Family

## 2016-05-03 DIAGNOSIS — N183 Chronic kidney disease, stage 3 (moderate): Secondary | ICD-10-CM | POA: Diagnosis not present

## 2016-05-03 DIAGNOSIS — I5032 Chronic diastolic (congestive) heart failure: Secondary | ICD-10-CM | POA: Diagnosis not present

## 2016-05-03 DIAGNOSIS — D500 Iron deficiency anemia secondary to blood loss (chronic): Secondary | ICD-10-CM | POA: Diagnosis not present

## 2016-05-03 DIAGNOSIS — E1151 Type 2 diabetes mellitus with diabetic peripheral angiopathy without gangrene: Secondary | ICD-10-CM | POA: Diagnosis not present

## 2016-05-03 DIAGNOSIS — I130 Hypertensive heart and chronic kidney disease with heart failure and stage 1 through stage 4 chronic kidney disease, or unspecified chronic kidney disease: Secondary | ICD-10-CM | POA: Diagnosis not present

## 2016-05-03 DIAGNOSIS — I4891 Unspecified atrial fibrillation: Secondary | ICD-10-CM | POA: Diagnosis not present

## 2016-05-03 DIAGNOSIS — E1140 Type 2 diabetes mellitus with diabetic neuropathy, unspecified: Secondary | ICD-10-CM | POA: Diagnosis not present

## 2016-05-03 DIAGNOSIS — Z8673 Personal history of transient ischemic attack (TIA), and cerebral infarction without residual deficits: Secondary | ICD-10-CM | POA: Diagnosis not present

## 2016-05-03 DIAGNOSIS — I272 Pulmonary hypertension, unspecified: Secondary | ICD-10-CM | POA: Diagnosis not present

## 2016-05-03 DIAGNOSIS — D62 Acute posthemorrhagic anemia: Secondary | ICD-10-CM | POA: Diagnosis not present

## 2016-05-03 DIAGNOSIS — K5730 Diverticulosis of large intestine without perforation or abscess without bleeding: Secondary | ICD-10-CM | POA: Diagnosis not present

## 2016-05-03 DIAGNOSIS — E785 Hyperlipidemia, unspecified: Secondary | ICD-10-CM | POA: Diagnosis not present

## 2016-05-03 DIAGNOSIS — E1122 Type 2 diabetes mellitus with diabetic chronic kidney disease: Secondary | ICD-10-CM | POA: Diagnosis not present

## 2016-05-04 DIAGNOSIS — I130 Hypertensive heart and chronic kidney disease with heart failure and stage 1 through stage 4 chronic kidney disease, or unspecified chronic kidney disease: Secondary | ICD-10-CM | POA: Diagnosis not present

## 2016-05-04 DIAGNOSIS — H35352 Cystoid macular degeneration, left eye: Secondary | ICD-10-CM | POA: Diagnosis not present

## 2016-05-04 DIAGNOSIS — I5032 Chronic diastolic (congestive) heart failure: Secondary | ICD-10-CM | POA: Diagnosis not present

## 2016-05-04 DIAGNOSIS — N183 Chronic kidney disease, stage 3 (moderate): Secondary | ICD-10-CM | POA: Diagnosis not present

## 2016-05-04 DIAGNOSIS — E1122 Type 2 diabetes mellitus with diabetic chronic kidney disease: Secondary | ICD-10-CM | POA: Diagnosis not present

## 2016-05-05 ENCOUNTER — Encounter (HOSPITAL_COMMUNITY): Payer: Medicare Other

## 2016-05-05 ENCOUNTER — Ambulatory Visit: Payer: Medicare Other | Admitting: Family

## 2016-05-06 DIAGNOSIS — E1122 Type 2 diabetes mellitus with diabetic chronic kidney disease: Secondary | ICD-10-CM | POA: Diagnosis not present

## 2016-05-06 DIAGNOSIS — K5730 Diverticulosis of large intestine without perforation or abscess without bleeding: Secondary | ICD-10-CM | POA: Diagnosis not present

## 2016-05-06 DIAGNOSIS — I130 Hypertensive heart and chronic kidney disease with heart failure and stage 1 through stage 4 chronic kidney disease, or unspecified chronic kidney disease: Secondary | ICD-10-CM | POA: Diagnosis not present

## 2016-05-06 DIAGNOSIS — I5032 Chronic diastolic (congestive) heart failure: Secondary | ICD-10-CM | POA: Diagnosis not present

## 2016-05-06 DIAGNOSIS — D62 Acute posthemorrhagic anemia: Secondary | ICD-10-CM | POA: Diagnosis not present

## 2016-05-06 DIAGNOSIS — E1151 Type 2 diabetes mellitus with diabetic peripheral angiopathy without gangrene: Secondary | ICD-10-CM | POA: Diagnosis not present

## 2016-05-06 DIAGNOSIS — E785 Hyperlipidemia, unspecified: Secondary | ICD-10-CM | POA: Diagnosis not present

## 2016-05-06 DIAGNOSIS — I272 Pulmonary hypertension, unspecified: Secondary | ICD-10-CM | POA: Diagnosis not present

## 2016-05-06 DIAGNOSIS — D500 Iron deficiency anemia secondary to blood loss (chronic): Secondary | ICD-10-CM | POA: Diagnosis not present

## 2016-05-06 DIAGNOSIS — Z8673 Personal history of transient ischemic attack (TIA), and cerebral infarction without residual deficits: Secondary | ICD-10-CM | POA: Diagnosis not present

## 2016-05-06 DIAGNOSIS — E1140 Type 2 diabetes mellitus with diabetic neuropathy, unspecified: Secondary | ICD-10-CM | POA: Diagnosis not present

## 2016-05-06 DIAGNOSIS — N183 Chronic kidney disease, stage 3 (moderate): Secondary | ICD-10-CM | POA: Diagnosis not present

## 2016-05-06 DIAGNOSIS — I4891 Unspecified atrial fibrillation: Secondary | ICD-10-CM | POA: Diagnosis not present

## 2016-05-13 ENCOUNTER — Telehealth: Payer: Self-pay | Admitting: Emergency Medicine

## 2016-05-13 NOTE — Telephone Encounter (Signed)
Pts son called and has a list of medications that have run out and wants to know if she needs to still be on some or all of these. Please give him a call thanks.

## 2016-05-14 NOTE — Telephone Encounter (Signed)
Advised patient son that he needed to get in touch with another doctor

## 2016-05-18 ENCOUNTER — Encounter: Payer: Self-pay | Admitting: Nurse Practitioner

## 2016-05-18 ENCOUNTER — Ambulatory Visit (INDEPENDENT_AMBULATORY_CARE_PROVIDER_SITE_OTHER): Payer: Medicare Other | Admitting: Nurse Practitioner

## 2016-05-18 ENCOUNTER — Other Ambulatory Visit (INDEPENDENT_AMBULATORY_CARE_PROVIDER_SITE_OTHER): Payer: Medicare Other

## 2016-05-18 VITALS — BP 116/70 | HR 72 | Temp 97.4°F | Ht 59.0 in | Wt 93.0 lb

## 2016-05-18 DIAGNOSIS — R42 Dizziness and giddiness: Secondary | ICD-10-CM

## 2016-05-18 DIAGNOSIS — I482 Chronic atrial fibrillation, unspecified: Secondary | ICD-10-CM

## 2016-05-18 DIAGNOSIS — D649 Anemia, unspecified: Secondary | ICD-10-CM

## 2016-05-18 DIAGNOSIS — R300 Dysuria: Secondary | ICD-10-CM | POA: Diagnosis not present

## 2016-05-18 DIAGNOSIS — I10 Essential (primary) hypertension: Secondary | ICD-10-CM

## 2016-05-18 DIAGNOSIS — I739 Peripheral vascular disease, unspecified: Secondary | ICD-10-CM

## 2016-05-18 LAB — CBC WITH DIFFERENTIAL/PLATELET
BASOS PCT: 1 % (ref 0.0–3.0)
Basophils Absolute: 0.1 10*3/uL (ref 0.0–0.1)
EOS ABS: 0.1 10*3/uL (ref 0.0–0.7)
EOS PCT: 0.9 % (ref 0.0–5.0)
HEMATOCRIT: 33.3 % — AB (ref 36.0–46.0)
HEMOGLOBIN: 10.8 g/dL — AB (ref 12.0–15.0)
LYMPHS PCT: 15.2 % (ref 12.0–46.0)
Lymphs Abs: 1.4 10*3/uL (ref 0.7–4.0)
MCHC: 32.5 g/dL (ref 30.0–36.0)
MCV: 87.6 fl (ref 78.0–100.0)
Monocytes Absolute: 0.7 10*3/uL (ref 0.1–1.0)
Monocytes Relative: 7.7 % (ref 3.0–12.0)
Neutro Abs: 6.8 10*3/uL (ref 1.4–7.7)
Neutrophils Relative %: 75.2 % (ref 43.0–77.0)
Platelets: 373 10*3/uL (ref 150.0–400.0)
RBC: 3.8 Mil/uL — ABNORMAL LOW (ref 3.87–5.11)
RDW: 16.7 % — AB (ref 11.5–15.5)
WBC: 9 10*3/uL (ref 4.0–10.5)

## 2016-05-18 LAB — BASIC METABOLIC PANEL
BUN: 10 mg/dL (ref 6–23)
CHLORIDE: 101 meq/L (ref 96–112)
CO2: 32 mEq/L (ref 19–32)
CREATININE: 0.59 mg/dL (ref 0.40–1.20)
Calcium: 8.6 mg/dL (ref 8.4–10.5)
GFR: 123.96 mL/min (ref >60.00)
Glucose, Bld: 106 mg/dL — ABNORMAL HIGH (ref 70–99)
POTASSIUM: 3.7 meq/L (ref 3.5–5.1)
SODIUM: 139 meq/L (ref 135–145)

## 2016-05-18 MED ORDER — ATORVASTATIN CALCIUM 10 MG PO TABS: ORAL_TABLET | ORAL | 3 refills | Status: DC

## 2016-05-18 MED ORDER — FERROUS SULFATE 325 (65 FE) MG PO TABS: ORAL_TABLET | ORAL | 3 refills | Status: DC

## 2016-05-18 MED ORDER — MECLIZINE HCL 12.5 MG PO TABS: 12.5000 mg | ORAL_TABLET | Freq: Two times a day (BID) | ORAL | 0 refills | Status: DC | PRN

## 2016-05-18 MED ORDER — PENTOXIFYLLINE ER 400 MG PO TBCR: EXTENDED_RELEASE_TABLET | ORAL | 3 refills | Status: DC

## 2016-05-18 MED ORDER — METOPROLOL TARTRATE 25 MG PO TABS: 12.5000 mg | ORAL_TABLET | Freq: Two times a day (BID) | ORAL | 3 refills | Status: DC

## 2016-05-18 MED ORDER — ASPIRIN 81 MG PO TABS: ORAL_TABLET | ORAL | 1 refills | Status: DC

## 2016-05-18 MED ORDER — FENOFIBRATE MICRONIZED 134 MG PO CAPS: ORAL_CAPSULE | ORAL | 3 refills | Status: DC

## 2016-05-18 MED ORDER — DILTIAZEM HCL ER COATED BEADS 180 MG PO CP24: 180.0000 mg | ORAL_CAPSULE | Freq: Every day | ORAL | 0 refills | Status: DC

## 2016-05-18 MED ORDER — PANTOPRAZOLE SODIUM 40 MG PO TBEC: 40.0000 mg | DELAYED_RELEASE_TABLET | Freq: Two times a day (BID) | ORAL | 3 refills | Status: DC

## 2016-05-18 MED ORDER — DILTIAZEM HCL ER COATED BEADS 180 MG PO CP24
180.0000 mg | ORAL_CAPSULE | Freq: Every day | ORAL | 0 refills | Status: DC
Start: 2016-05-18 — End: 2016-07-12

## 2016-05-18 NOTE — Progress Notes (Signed)
Subjective:  Patient ID: Tracy [name], female    DOB: 1926/12/22  Age: 83 y.o. MRN: [id number]  CC: Dizziness (dizziness for 4 days,when stand or move. )  Tracy [name] in exam room with son.  Dizziness  This is a new problem. The current episode started in the past 7 days. The problem occurs intermittently. The problem has been waxing and waning. Associated symptoms include urinary symptoms and vertigo. Pertinent negatives include no abdominal pain, anorexia, arthralgias, change in bowel habit, chills, congestion, coughing, diaphoresis, fatigue, fever, headaches, myalgias, nausea, neck pain, numbness, rash, sore throat, swollen glands, visual change, vomiting or weakness. Exacerbated by: bending over or turning in bed. She has tried nothing for the symptoms.  no palpitations with dizzy episodes.  Patient's son needs medications refills but unsure about her medications. Some medications were initiate while she was in nursing home.  She has been out of diltiazem, zyprexa and aricept for 1week. aricept and zyprexa were prescribed in nursing home. Son denies any previous hx of altered memory or bipolar or schizoprenia.  She has been taking HCTZ daily.   Outpatient Medications Prior to Visit  Medication Sig Dispense Refill  . donepezil (ARICEPT) 5 MG tablet Take 1 tablet (5 mg total) by mouth at bedtime.    . Multiple Vitamin (MULTIVITAMIN WITH MINERALS) TABS tablet Take 1 tablet by mouth daily.    . Multiple Vitamins-Minerals (PRESERVISION AREDS 2) CAPS Take 1 capsule by mouth 2 (two) times daily.    Marland Kitchen OLANZapine (ZYPREXA) 2.5 MG tablet Take 2.5 mg by mouth daily.    Marland Kitchen amLODipine (NORVASC) 5 MG tablet Take 5 mg by mouth daily.    Marland Kitchen aspirin 81 MG tablet 1 tablet daily with food 30 tablet 1  . atorvastatin (LIPITOR) 10 MG tablet 1/2 tab by mouth daily 45 tablet 3  . diltiazem (CARDIZEM CD) 180 MG 24 hr capsule Take 1 capsule (180 mg total) by mouth daily. 30 capsule 0  . fenofibrate  micronized (LOFIBRA) 134 MG capsule TAKE 1 CAPSULE (134 MG TOTAL) BY MOUTH DAILY. 90 capsule 3  . ferrous sulfate 325 (65 FE) MG tablet 1 twice a day 60 tablet 3  . hydrochlorothiazide (MICROZIDE) 12.5 MG capsule Take 1 capsule (12.5 mg total) by mouth daily as needed. 90 capsule 3  . metoprolol tartrate (LOPRESSOR) 25 MG tablet Take 0.5 tablets (12.5 mg total) by mouth 2 (two) times daily. 60 tablet   . pantoprazole (PROTONIX) 40 MG tablet Take 1 tablet (40 mg total) by mouth 2 (two) times daily before a meal. 60 tablet 3  . pentoxifylline (TRENTAL) 400 MG CR tablet TAKE 1 TABLET (400 MG TOTAL) BY MOUTH DAILY. 90 tablet 3  . feeding supplement, ENSURE ENLIVE, (ENSURE ENLIVE) LIQD Take 237 mLs by mouth 2 (two) times daily between meals. (Patient not taking: Reported on 05/18/2016) 30 Bottle 1  . oxybutynin (DITROPAN-XL) 5 MG 24 hr tablet TAKE 1 TABLET BY MOUTH EVERY DAY (Patient not taking: Reported on 05/18/2016) 90 tablet 3  . polyethylene glycol powder (GLYCOLAX/MIRALAX) powder Mix 17g of Miralax in 8oz of water twice daily. (Patient not taking: Reported on 05/18/2016) 255 g 3   No facility-administered medications prior to visit.     ROS See HPI  Objective:  BP 116/70   Pulse 72   Temp 97.4 F (36.3 C)   Ht 4\' 11"  (1.499 m)   Wt 93 lb (42.2 kg)   SpO2 97%   BMI 18.78 kg/m   BP  Readings from Last 3 Encounters:  05/18/16 116/70  04/29/16 118/64  04/22/16 123/68    Wt Readings from Last 3 Encounters:  05/18/16 93 lb (42.2 kg)  04/29/16 98 lb (44.5 kg)  04/22/16 98 lb 1.6 oz (44.5 kg)    Physical Exam  Constitutional: She is oriented to person, place, and time. No distress.  HENT:  Right Ear: External ear normal.  Left Ear: External ear normal.  Nose: Nose normal.  Mouth/Throat: No oropharyngeal exudate.  Eyes: No scleral icterus.  Neck: Normal range of motion. Neck supple.  Cardiovascular: Normal rate, regular rhythm and normal heart sounds.   Pulmonary/Chest: Effort  normal and breath sounds normal. No respiratory distress.  Abdominal: Soft. She exhibits no distension.  Musculoskeletal: Normal range of motion. She exhibits edema. She exhibits no tenderness.  Lymphadenopathy:    She has no cervical adenopathy.  Neurological: She is alert and oriented to person, place, and time.  She is wheelchair bound.  Skin: Skin is warm and dry.  Psychiatric: She has a normal mood and affect. Her behavior is normal.    Lab Results  Component Value Date   WBC 9.0 05/18/2016   HGB 10.8 (L) 05/18/2016   HCT 33.3 (L) 05/18/2016   PLT 373.0 05/18/2016   GLUCOSE 106 (H) 05/18/2016   CHOL 70 02/25/2016   TRIG 126 02/25/2016   HDL 22 (L) 02/25/2016   LDLCALC 23 02/25/2016   ALT 14 04/19/2016   AST 21 04/19/2016   NA 139 05/18/2016   K 3.7 05/18/2016   CL 101 05/18/2016   CREATININE 0.59 05/18/2016   BUN 10 05/18/2016   CO2 32 05/18/2016   TSH 3.05 03/11/2016   INR 1.10 04/21/2016   HGBA1C 5.2 02/23/2016   MICROALBUR 24.4 (H) 05/27/2015    Nm Gi Blood Loss  Result Date: 04/19/2016 CLINICAL DATA:  Upper GI bleed, diverticulosis, on Coumadin, abdominal pain EXAM: NUCLEAR MEDICINE GASTROINTESTINAL BLEEDING SCAN TECHNIQUE: Sequential abdominal images were obtained following intravenous administration of Tc-5761m labeled red blood cells. RADIOPHARMACEUTICALS:  25.8 mCi Tc-7961m in-vitro labeled red cells. COMPARISON:  CT abdomen/ pelvis dated 03/15/2016 FINDINGS: Within 5 minutes, there is increased radiotracer within the splenic flexure of the colon, indicating active GI bleeding. There is propagation into the descending colon during the course of the study, along with retrograde flow along the transverse colon at 55-60 minutes. The study was aborted at 60 minutes. IMPRESSION: Active GI bleeding localized to the splenic flexure of the colon. These results were called by telephone at the time of interpretation on 04/19/2016 at 9:19 am to Dr. Irish LackGlenn Yamagata, who verbally  acknowledged these results. Electronically Signed   By: Charline BillsSriyesh  Krishnan M.D.   On: 04/19/2016 09:19    Assessment & Plan:   Barron AlvineOphelia was seen today for dizziness.  Diagnoses and all orders for this visit:  Vertigo -     Basic metabolic panel; Future -     CBC w/Diff; Future -     meclizine (ANTIVERT) 12.5 MG tablet; Take 1 tablet (12.5 mg total) by mouth 2 (two) times daily as needed for dizziness (do not use for more than 3days).  Anemia, unspecified type -     Basic metabolic panel; Future -     CBC w/Diff; Future -     ferrous sulfate 325 (65 FE) MG tablet; 1 twice a day -     pantoprazole (PROTONIX) 40 MG tablet; Take 1 tablet (40 mg total) by mouth 2 (two) times daily before  a meal.  Dysuria -     Urinalysis, Routine w reflex microscopic; Future  Chronic atrial fibrillation (HCC) -     Discontinue: diltiazem (CARDIZEM CD) 180 MG 24 hr capsule; Take 1 capsule (180 mg total) by mouth daily. -     metoprolol tartrate (LOPRESSOR) 25 MG tablet; Take 0.5 tablets (12.5 mg total) by mouth 2 (two) times daily. -     aspirin 81 MG tablet; 1 tablet daily with food -     diltiazem (CARDIZEM CD) 180 MG 24 hr capsule; Take 1 capsule (180 mg total) by mouth daily.  Essential hypertension -     metoprolol tartrate (LOPRESSOR) 25 MG tablet; Take 0.5 tablets (12.5 mg total) by mouth 2 (two) times daily.  PVD (peripheral vascular disease) (HCC) -     atorvastatin (LIPITOR) 10 MG tablet; 1/2 tab by mouth daily -     pentoxifylline (TRENTAL) 400 MG CR tablet; TAKE 1 TABLET (400 MG TOTAL) BY MOUTH DAILY. -     aspirin 81 MG tablet; 1 tablet daily with food -     fenofibrate micronized (LOFIBRA) 134 MG capsule; TAKE 1 CAPSULE (134 MG TOTAL) BY MOUTH DAILY.   I have discontinued Tracy [name]'s oxybutynin, feeding supplement (ENSURE ENLIVE), amLODipine, polyethylene glycol powder, and hydrochlorothiazide. I am also having her start on meclizine. Additionally, I am having her maintain her  PRESERVISION AREDS 2, donepezil, OLANZapine, multivitamin with minerals, ferrous sulfate, atorvastatin, metoprolol tartrate, pantoprazole, pentoxifylline, aspirin, fenofibrate micronized, and diltiazem.  Meds ordered this encounter  Medications  . ferrous sulfate 325 (65 FE) MG tablet    Sig: 1 twice a day    Dispense:  60 tablet    Refill:  3    Order Specific Question:   Supervising Provider    Answer:   Tresa Garter [[id number]]  . DISCONTD: diltiazem (CARDIZEM CD) 180 MG 24 hr capsule    Sig: Take 1 capsule (180 mg total) by mouth daily.    Dispense:  30 capsule    Refill:  0    Order Specific Question:   Supervising Provider    Answer:   Tresa Garter [[id number]]  . atorvastatin (LIPITOR) 10 MG tablet    Sig: 1/2 tab by mouth daily    Dispense:  45 tablet    Refill:  3    Order Specific Question:   Supervising Provider    Answer:   Tresa Garter [[id number]]  . metoprolol tartrate (LOPRESSOR) 25 MG tablet    Sig: Take 0.5 tablets (12.5 mg total) by mouth 2 (two) times daily.    Dispense:  30 tablet    Refill:  3    Order Specific Question:   Supervising Provider    Answer:   Tresa Garter [[id number]]  . pantoprazole (PROTONIX) 40 MG tablet    Sig: Take 1 tablet (40 mg total) by mouth 2 (two) times daily before a meal.    Dispense:  60 tablet    Refill:  3    Order Specific Question:   Supervising Provider    Answer:   Tresa Garter [[id number]]  . pentoxifylline (TRENTAL) 400 MG CR tablet    Sig: TAKE 1 TABLET (400 MG TOTAL) BY MOUTH DAILY.    Dispense:  30 tablet    Refill:  3    Order Specific Question:   Supervising Provider    Answer:   Tresa Garter [[id number]]  . aspirin 81 MG tablet    Sig:  1 tablet daily with food    Dispense:  30 tablet    Refill:  1    Order Specific Question:   Supervising Provider    Answer:   Tresa Garter [[id number]]  . fenofibrate micronized (LOFIBRA) 134 MG capsule    Sig: TAKE 1 CAPSULE (134 MG TOTAL) BY MOUTH DAILY.     Dispense:  30 capsule    Refill:  3    Order Specific Question:   Supervising Provider    Answer:   Tresa Garter [[id number]]  . diltiazem (CARDIZEM CD) 180 MG 24 hr capsule    Sig: Take 1 capsule (180 mg total) by mouth daily.    Dispense:  30 capsule    Refill:  0    Order Specific Question:   Supervising Provider    Answer:   Tresa Garter [[id number]]  . meclizine (ANTIVERT) 12.5 MG tablet    Sig: Take 1 tablet (12.5 mg total) by mouth 2 (two) times daily as needed for dizziness (do not use for more than 3days).    Dispense:  6 tablet    Refill:  0    Order Specific Question:   Supervising Provider    Answer:   Tresa Garter [[id number]]    Follow-up: Return if symptoms worsen or fail to improve.  Alysia Penna, NP

## 2016-05-18 NOTE — Progress Notes (Signed)
Pre visit review using our clinic review tool, if applicable. No additional management support is needed unless otherwise documented below in the visit note. 

## 2016-05-18 NOTE — Patient Instructions (Addendum)
Normal urinalysis. Change positions slowly. Encourage adequate oral hydration.  Hold zyprexa  And aricept for now. Maintain upcoming appt with Dr. Jonny RuizJohn to review medications again.  Benign Positional Vertigo Introduction Vertigo is the feeling that you or your surroundings are moving when they are not. Benign positional vertigo is the most common form of vertigo. The cause of this condition is not serious (is benign). This condition is triggered by certain movements and positions (is positional). This condition can be dangerous if it occurs while you are doing something that could endanger you or others, such as driving. What are the causes? In many cases, the cause of this condition is not known. It may be caused by a disturbance in an area of the inner ear that helps your brain to sense movement and balance. This disturbance can be caused by a viral infection (labyrinthitis), head injury, or repetitive motion. What increases the risk? This condition is more likely to develop in:  Women.  People who are 81 years of age or older. What are the signs or symptoms? Symptoms of this condition usually happen when you move your head or your eyes in different directions. Symptoms may start suddenly, and they usually last for less than a minute. Symptoms may include:  Loss of balance and falling.  Feeling like you are spinning or moving.  Feeling like your surroundings are spinning or moving.  Nausea and vomiting.  Blurred vision.  Dizziness.  Involuntary eye movement (nystagmus). Symptoms can be mild and cause only slight annoyance, or they can be severe and interfere with daily life. Episodes of benign positional vertigo may return (recur) over time, and they may be triggered by certain movements. Symptoms may improve over time. How is this diagnosed? This condition is usually diagnosed by medical history and a physical exam of the head, neck, and ears. You may be referred to a health  care provider who specializes in ear, nose, and throat (ENT) problems (otolaryngologist) or a provider who specializes in disorders of the nervous system (neurologist). You may have additional testing, including:  MRI.  A CT scan.  Eye movement tests. Your health care provider may ask you to change positions quickly while he or she watches you for symptoms of benign positional vertigo, such as nystagmus. Eye movement may be tested with an electronystagmogram (ENG), caloric stimulation, the Dix-Hallpike test, or the roll test.  An electroencephalogram (EEG). This records electrical activity in your brain.  Hearing tests. How is this treated? Usually, your health care provider will treat this by moving your head in specific positions to adjust your inner ear back to normal. Surgery may be needed in severe cases, but this is rare. In some cases, benign positional vertigo may resolve on its own in 2-4 weeks. Follow these instructions at home: Safety  Move slowly.Avoid sudden body or head movements.  Avoid driving.  Avoid operating heavy machinery.  Avoid doing any tasks that would be dangerous to you or others if a vertigo episode would occur.  If you have trouble walking or keeping your balance, try using a cane for stability. If you feel dizzy or unstable, sit down right away.  Return to your normal activities as told by your health care provider. Ask your health care provider what activities are safe for you. General instructions  Take over-the-counter and prescription medicines only as told by your health care provider.  Avoid certain positions or movements as told by your health care provider.  Drink enough fluid to  keep your urine clear or pale yellow.  Keep all follow-up visits as told by your health care provider. This is important. Contact a health care provider if:  You have a fever.  Your condition gets worse or you develop new symptoms.  Your family or friends notice  any behavioral changes.  Your nausea or vomiting gets worse.  You have numbness or a "pins and needles" sensation. Get help right away if:  You have difficulty speaking or moving.  You are always dizzy.  You faint.  You develop severe headaches.  You have weakness in your legs or arms.  You have changes in your hearing or vision.  You develop a stiff neck.  You develop sensitivity to light. This information is not intended to replace advice given to you by your health care provider. Make sure you discuss any questions you have with your health care provider. Document Released: 12/28/2005 Document Revised: 08/28/2015 Document Reviewed: 07/15/2014  2017 [employer]

## 2016-05-19 ENCOUNTER — Other Ambulatory Visit (INDEPENDENT_AMBULATORY_CARE_PROVIDER_SITE_OTHER): Payer: Medicare Other

## 2016-05-19 DIAGNOSIS — R300 Dysuria: Secondary | ICD-10-CM

## 2016-05-19 LAB — URINALYSIS, ROUTINE W REFLEX MICROSCOPIC
BILIRUBIN URINE: NEGATIVE
Hgb urine dipstick: NEGATIVE
KETONES UR: NEGATIVE
LEUKOCYTES UA: NEGATIVE
Nitrite: NEGATIVE
PH: 7 (ref 5.0–8.0)
RBC / HPF: NONE SEEN (ref 0–?)
Specific Gravity, Urine: 1.01 (ref 1.000–1.030)
TOTAL PROTEIN, URINE-UPE24: NEGATIVE
URINE GLUCOSE: NEGATIVE
Urobilinogen, UA: 2 — AB (ref 0.0–1.0)

## 2016-05-24 ENCOUNTER — Ambulatory Visit (AMBULATORY_SURGERY_CENTER): Payer: Medicare Other | Admitting: Gastroenterology

## 2016-05-24 VITALS — BP 114/57 | HR 92 | Temp 97.1°F | Resp 20 | Ht 59.0 in | Wt 96.0 lb

## 2016-05-24 DIAGNOSIS — K250 Acute gastric ulcer with hemorrhage: Secondary | ICD-10-CM | POA: Diagnosis present

## 2016-05-24 DIAGNOSIS — K259 Gastric ulcer, unspecified as acute or chronic, without hemorrhage or perforation: Secondary | ICD-10-CM | POA: Diagnosis not present

## 2016-05-24 DIAGNOSIS — K922 Gastrointestinal hemorrhage, unspecified: Secondary | ICD-10-CM | POA: Diagnosis not present

## 2016-05-24 DIAGNOSIS — K222 Esophageal obstruction: Secondary | ICD-10-CM | POA: Diagnosis not present

## 2016-05-24 DIAGNOSIS — K3189 Other diseases of stomach and duodenum: Secondary | ICD-10-CM | POA: Diagnosis not present

## 2016-05-24 MED ORDER — SODIUM CHLORIDE 0.9 % IV SOLN
500.0000 mL | INTRAVENOUS | Status: DC
Start: 2016-05-24 — End: 2016-06-01

## 2016-05-24 NOTE — Op Note (Signed)
[facility] Patient Name: Tracy [name] Procedure Date: 05/24/2016 3:50 PM MRN: [id number] Endoscopist: Viviann Spare P. Cabell Lazenby MD, MD Age: 83 Referring MD:  Date of Birth: [birth year] Gender: Female Account #: [id number] Procedure:                Upper GI endoscopy Indications:              Follow-up of history of gastric ulcer with                            hemorrhage, now off coumadin, on PPI, history of                            reported pyloric stenosis on last EGD Medicines:                Monitored Anesthesia Care Procedure:                Pre-Anesthesia Assessment:                           - Prior to the procedure, a History and Physical                            was performed, and patient medications and                            allergies were reviewed. The patient's tolerance of                            previous anesthesia was also reviewed. The risks                            and benefits of the procedure and the sedation                            options and risks were discussed with the patient.                            All questions were answered, and informed consent                            was obtained. Prior Anticoagulants: The patient has                            taken aspirin, last dose was 1 day prior to                            procedure. ASA Grade Assessment: III - A patient                            with severe systemic disease. After reviewing the                            risks and benefits, the patient was deemed in  satisfactory condition to undergo the procedure.                           After obtaining informed consent, the endoscope was                            passed under direct vision. Throughout the                            procedure, the patient's blood pressure, pulse, and                            oxygen saturations were monitored continuously. The                            Model  [id number]-[id number]) scope was introduced                            through the mouth, and advanced to the second part                            of duodenum. The upper GI endoscopy was                            accomplished without difficulty. The patient                            tolerated the procedure well. Scope In: Scope Out: Findings:                 Esophagogastric landmarks were identified: the                            Z-line was found at 33 cm, the gastroesophageal                            junction was found at 33 cm and the upper extent of                            the gastric folds was found at 38 cm from the                            incisors.                           A 5 cm hiatal hernia was present.                           One mild benign-appearing, intrinsic stenosis was                            found 33 cm from the incisors. This measured less                            than one  cm (in length) and was traversed. No                            dilation was performed given the patient has no                            dysphagia.                           The exam of the esophagus was otherwise normal.                           A benign-appearing, intrinsic mild stenosis was                            found at the pylorus with edema. This was easily                            traversed. The mucosa otherwise appeared normal in                            this area. Biopsies were taken with a cold forceps                            for histology.                           The exam of the stomach was otherwise normal.                            Interval healing of gastric ulcer noted.                           A large diverticulum was found in the 2nd portion                            of the duodenum.                           The exam of the duodenum was otherwise normal. Complications:            No immediate complications. Estimated blood loss:                             Minimal. Estimated Blood Loss:     Estimated blood loss was minimal. Impression:               - Esophagogastric landmarks identified.                           - 5 cm hiatal hernia.                           - Benign-appearing esophageal stenosis.                           -  Interval healing of gastric ulcer                           - Mild benign appearing stenosis was found at the                            pylorus. Biopsied.                           - Duodenal diverticulum. Recommendation:           - Patient has a contact number available for                            emergencies. The signs and symptoms of potential                            delayed complications were discussed with the                            patient. Return to normal activities tomorrow.                            Written discharge instructions were provided to the                            patient.                           - Resume previous diet.                           - Continue present medications.                           - Await pathology results. Viviann Spare P. Aracelys Glade MD, MD 05/24/2016 4:21:08 PM This report has been signed electronically.

## 2016-05-24 NOTE — Progress Notes (Signed)
The patient was counseled regarding her diet.  She has been drinking a lot of orange juice.  I told her that the orange juice was not helping her gastric redness and reflux.  Her son gave good feedback.

## 2016-05-24 NOTE — Progress Notes (Signed)
Pt's states no medical or surgical changes since previsit or office visit. 

## 2016-05-24 NOTE — Progress Notes (Signed)
Called to room to assist during endoscopic procedure.  Patient ID and intended procedure confirmed with present staff. Received instructions for my participation in the procedure from the performing physician.  

## 2016-05-24 NOTE — Patient Instructions (Signed)
YOU HAD AN ENDOSCOPIC PROCEDURE TODAY AT THE [facility]:   Refer to the procedure report that was given to you for any specific questions about what was found during the examination.  If the procedure report does not answer your questions, please call your gastroenterologist to clarify.  If you requested that your care partner not be given the details of your procedure findings, then the procedure report has been included in a sealed envelope for you to review at your convenience later.  YOU SHOULD EXPECT: Some feelings of bloating in the abdomen. Passage of more gas than usual.  Walking can help get rid of the air that was put into your GI tract during the procedure and reduce the bloating. If you had a lower endoscopy (such as a colonoscopy or flexible sigmoidoscopy) you may notice spotting of blood in your stool or on the toilet paper. If you underwent a bowel prep for your procedure, you may not have a normal bowel movement for a few days.  Please Note:  You might notice some irritation and congestion in your nose or some drainage.  This is from the oxygen used during your procedure.  There is no need for concern and it should clear up in a day or so.  SYMPTOMS TO REPORT IMMEDIATELY:    Following upper endoscopy (EGD)  Vomiting of blood or coffee ground material  New chest pain or pain under the shoulder blades  Painful or persistently difficult swallowing  New shortness of breath  Fever of 100F or higher  Black, tarry-looking stools  For urgent or emergent issues, a gastroenterologist can be reached at any hour by calling [phone].   DIET:  We do recommend a small meal at first, but then you may proceed to your regular diet.  Drink plenty of fluids but you should avoid alcoholic beverages for 24 hours.  ACTIVITY:  You should plan to take it easy for the rest of today and you should NOT DRIVE or use heavy machinery until tomorrow (because of the sedation medicines used  during the test).    FOLLOW UP: Our staff will call the number listed on your records the next business day following your procedure to check on you and address any questions or concerns that you may have regarding the information given to you following your procedure. If we do not reach you, we will leave a message.  However, if you are feeling well and you are not experiencing any problems, there is no need to return our call.  We will assume that you have returned to your regular daily activities without incident.  If any biopsies were taken you will be contacted by phone or by letter within the next 1-3 weeks.  Please call us at [phone] if you have not heard about the biopsies in 3 weeks.    SIGNATURES/CONFIDENTIALITY: You and/or your care partner have signed paperwork which will be entered into your electronic medical record.  These signatures attest to the fact that that the information above on your After Visit Summary has been reviewed and is understood.  Full responsibility of the confidentiality of this discharge information lies with you and/or your care-partner.  Read the handout about the hiatal hernia.  Try to put your bed up on blocks at the head of the bed.  It may reduce your reflux.

## 2016-05-25 ENCOUNTER — Telehealth: Payer: Self-pay

## 2016-05-25 NOTE — Telephone Encounter (Signed)
  Follow up Call-  Call back number 05/24/2016  Post procedure Call Back phone  # [phone]  Permission to leave phone message Yes  Some recent data might be hidden     Patient questions:  Do you have a fever, pain , or abdominal swelling? No. Pain Score  0 *  Have you tolerated food without any problems? Yes.    Have you been able to return to your normal activities? Yes.    Do you have any questions about your discharge instructions: Diet   No. Medications  No. Follow up visit  No.  Do you have questions or concerns about your Care? No.  Actions: * If pain score is 4 or above: No action needed, pain <4.

## 2016-05-26 ENCOUNTER — Ambulatory Visit: Payer: Medicare Other | Admitting: Internal Medicine

## 2016-06-01 ENCOUNTER — Encounter: Payer: Self-pay | Admitting: Internal Medicine

## 2016-06-01 ENCOUNTER — Ambulatory Visit: Payer: Medicare Other | Admitting: Internal Medicine

## 2016-06-01 ENCOUNTER — Ambulatory Visit (INDEPENDENT_AMBULATORY_CARE_PROVIDER_SITE_OTHER): Payer: Medicare Other | Admitting: Internal Medicine

## 2016-06-01 ENCOUNTER — Other Ambulatory Visit: Payer: Self-pay | Admitting: Internal Medicine

## 2016-06-01 VITALS — BP 108/82 | HR 82 | Temp 98.3°F | Ht 59.0 in | Wt 91.0 lb

## 2016-06-01 DIAGNOSIS — E785 Hyperlipidemia, unspecified: Secondary | ICD-10-CM

## 2016-06-01 DIAGNOSIS — D649 Anemia, unspecified: Secondary | ICD-10-CM | POA: Diagnosis not present

## 2016-06-01 DIAGNOSIS — I10 Essential (primary) hypertension: Secondary | ICD-10-CM | POA: Diagnosis not present

## 2016-06-01 DIAGNOSIS — R739 Hyperglycemia, unspecified: Secondary | ICD-10-CM | POA: Insufficient documentation

## 2016-06-01 DIAGNOSIS — I5032 Chronic diastolic (congestive) heart failure: Secondary | ICD-10-CM

## 2016-06-01 DIAGNOSIS — Z0001 Encounter for general adult medical examination with abnormal findings: Secondary | ICD-10-CM

## 2016-06-01 MED ORDER — HYDROCHLOROTHIAZIDE 12.5 MG PO CAPS: 12.5000 mg | ORAL_CAPSULE | Freq: Every day | ORAL | 3 refills | Status: DC

## 2016-06-01 NOTE — Progress Notes (Signed)
Subjective:    Patient ID: Tracy [name], female    DOB: 11-May-1926, 83 y.o.   MRN: [id number]  HPI  Here to f/u with son; overall doing ok,  Pt denies chest pain, increasing sob or doe, wheezing, orthopnea, PND, palpitations, dizziness or syncope, though has persistent worsening bilat ankle edema after previous diuretic stopped..  Pt denies new neurological symptoms such as new headache, or facial or extremity weakness or numbness.  Pt denies polydipsia, polyuria, or low sugar episode.   Pt denies new neurological symptoms such as new headache, or facial or extremity weakness or numbness.   Pt states overall good compliance with meds, Sees optho every 6-8 wks. Examined in wheelchair today Past Medical History:  Diagnosis Date  . Atrial fibrillation (HCC)   . CAROTID STENOSIS   . CVA (cerebrovascular accident) (HCC) 2002  . DIABETES MELLITUS, TYPE II   . DIVERTICULOSIS, COLON   . DM (diabetes mellitus), type 2 (HCC)   . DVT (deep venous thrombosis) (HCC)   . Gastric ulcer   . GERD   . HTN (hypertension)   . Hx of adenomatous colonic polyps 2007  . Hyperlipidemia   . Hypertension   . OSTEOPENIA   . PVD (peripheral vascular disease) (HCC)    recently dx w/ carotid -left  int carotid >90% 50-60% rt int carotid 02/2010  . PVD (peripheral vascular disease) (HCC)    lf  ABI 0.11 and rt 0.43  . Sinus node dysfunction (HCC) 2011  . Stroke [facility]) July 01, 2014   Affected Left side  . Upper GI bleed 02/2016   Past Surgical History:  Procedure Laterality Date  . CAROTID ENDARTERECTOMY    . cataract eye surgery    . CNS vascular stent per Neurosurgeon    . ESOPHAGOGASTRODUODENOSCOPY (EGD) WITH PROPOFOL N/A 02/24/2016   Procedure: ESOPHAGOGASTRODUODENOSCOPY (EGD) WITH PROPOFOL;  Surgeon: Kathi Der, MD;  Location: [facility];  Service: Gastroenterology;  Laterality: N/A;  . EYE SURGERY    . ILIAC ARTERY STENT  2011   Bilateral CIA stent  . lazer eye surgery    . RADIOLOGY  WITH ANESTHESIA N/A 03/20/2016   Procedure: MRI OF BRAIN;  Surgeon: Medication Radiologist, MD;  Location: [facility] OR;  Service: Radiology;  Laterality: N/A;  . Right Shoulder Surgery  2010  . TUBAL LIGATION      reports that she has quit smoking. She has never used smokeless tobacco. She reports that she does not drink alcohol or use drugs. family history includes Colon cancer in her mother; Diabetes in her sister. No Known Allergies Current Outpatient Prescriptions on File Prior to Visit  Medication Sig Dispense Refill  . aspirin 81 MG tablet 1 tablet daily with food 30 tablet 1  . atorvastatin (LIPITOR) 10 MG tablet 1/2 tab by mouth daily 45 tablet 3  . diltiazem (CARDIZEM CD) 180 MG 24 hr capsule Take 1 capsule (180 mg total) by mouth daily. 30 capsule 0  . donepezil (ARICEPT) 5 MG tablet Take 1 tablet (5 mg total) by mouth at bedtime.    . fenofibrate micronized (LOFIBRA) 134 MG capsule TAKE 1 CAPSULE (134 MG TOTAL) BY MOUTH DAILY. 30 capsule 3  . ferrous sulfate 325 (65 FE) MG tablet 1 twice a day 60 tablet 3  . meclizine (ANTIVERT) 12.5 MG tablet Take 1 tablet (12.5 mg total) by mouth 2 (two) times daily as needed for dizziness (do not use for more than 3days). 6 tablet 0  . metoprolol tartrate (  LOPRESSOR) 25 MG tablet Take 0.5 tablets (12.5 mg total) by mouth 2 (two) times daily. 30 tablet 3  . Multiple Vitamin (MULTIVITAMIN WITH MINERALS) TABS tablet Take 1 tablet by mouth daily.    . Multiple Vitamins-Minerals (PRESERVISION AREDS 2) CAPS Take 1 capsule by mouth 2 (two) times daily.    Marland Kitchen. OLANZapine (ZYPREXA) 2.5 MG tablet Take 2.5 mg by mouth daily.    . pantoprazole (PROTONIX) 40 MG tablet Take 1 tablet (40 mg total) by mouth 2 (two) times daily before a meal. 60 tablet 3  . pentoxifylline (TRENTAL) 400 MG CR tablet TAKE 1 TABLET (400 MG TOTAL) BY MOUTH DAILY. 30 tablet 3   No current facility-administered medications on file prior to visit.    Review of Systems  Constitutional:  Negative for unusual diaphoresis or night sweats HENT: Negative for ear swelling or discharge Eyes: Negative for worsening visual haziness  Respiratory: Negative for choking and stridor.   Gastrointestinal: Negative for distension or worsening eructation Genitourinary: Negative for retention or change in urine volume.  Musculoskeletal: Negative for other MSK pain or swelling Skin: Negative for color change and worsening wound Neurological: Negative for tremors and numbness other than noted  Psychiatric/Behavioral: Negative for decreased concentration or agitation other than above   All other system neg per pt    Objective:   Physical Exam BP 108/82   Pulse 82   Temp 98.3 F (36.8 C)   Ht 4\' 11"  (1.499 m)   Wt 91 lb (41.3 kg)   SpO2 98%   BMI 18.38 kg/m  VS noted, not ill appearing, thin, frail Constitutional: Pt appears in no apparent distress HENT: Head: NCAT.  Right Ear: External ear normal.  Left Ear: External ear normal.  Eyes: . Pupils are equal, round, and reactive to light. Conjunctivae and EOM are normal Neck: Normal range of motion. Neck supple.  Cardiovascular: Normal rate and regular rhythm.   Pulmonary/Chest: Effort normal and breath sounds decreased without rales or wheezing.  Abd:  Soft, NT, ND, + BS Neurological: Pt is alert. Not confused , motor  - moves all 4s, chronic left side weakness Skin: Skin is warm. No rash, 2+ bilat ankle LE edema Psychiatric: Pt behavior is normal. No agitation.  No other new exam findings  POCT A1c -   Transthoracic Echocardiography Study Date: 02/24/2016 Impressions: - Normal LV systolic function; elevated LV filling pressure;   biatrial enlargement; calcified aortic valve with no AS by   doppler; moderate MR; mild TR with severely elevated pulmonary   pressure.     Assessment & Plan:

## 2016-06-01 NOTE — Patient Instructions (Addendum)
Please take all new medication as prescribed - the fluid pill  Your A1c in the office was OK today  Please continue all other medications as before, and refills have been done if requested.  Please have the pharmacy call with any other refills you may need.  Please keep your appointments with your specialists as you may have planned  Please return in 6 months, or sooner if needed, with Lab testing done 3-5 days before

## 2016-06-02 NOTE — Assessment & Plan Note (Signed)
Ok for hct 12.5 qd prn, with careful attention to BP which limits tx

## 2016-06-02 NOTE — Assessment & Plan Note (Signed)
stable overall by history and exam, recent data reviewed with pt, and pt to continue medical treatment as before,  to f/u any worsening symptoms or concerns Lab Results  Component Value Date   LDLCALC 23 02/25/2016

## 2016-06-02 NOTE — Assessment & Plan Note (Signed)
stable overall by history and exam, recent data reviewed with pt, and pt to continue medical treatment as before,  to f/u any worsening symptoms or concerns BP Readings from Last 3 Encounters:  06/01/16 108/82  05/24/16 (!) 114/57  05/18/16 116/70

## 2016-06-02 NOTE — Assessment & Plan Note (Signed)
stable overall by history and exam, recent data reviewed with pt, and pt to continue medical treatment as before,  to f/u any worsening symptoms or concerns Lab Results  Component Value Date   HGBA1C 5.2 02/23/2016   For f/u a1c today per pt request

## 2016-06-03 ENCOUNTER — Ambulatory Visit: Payer: Medicare Other | Admitting: Internal Medicine

## 2016-06-07 ENCOUNTER — Other Ambulatory Visit (INDEPENDENT_AMBULATORY_CARE_PROVIDER_SITE_OTHER): Payer: Medicare Other | Admitting: Internal Medicine

## 2016-06-07 DIAGNOSIS — R739 Hyperglycemia, unspecified: Secondary | ICD-10-CM | POA: Diagnosis not present

## 2016-06-07 LAB — POCT GLYCOSYLATED HEMOGLOBIN (HGB A1C): Hemoglobin A1C: 5.1

## 2016-06-16 ENCOUNTER — Encounter: Payer: Self-pay | Admitting: Internal Medicine

## 2016-06-16 ENCOUNTER — Ambulatory Visit (INDEPENDENT_AMBULATORY_CARE_PROVIDER_SITE_OTHER): Payer: Medicare Other | Admitting: Internal Medicine

## 2016-06-16 VITALS — BP 120/70 | HR 78 | Temp 97.5°F

## 2016-06-16 DIAGNOSIS — R109 Unspecified abdominal pain: Secondary | ICD-10-CM

## 2016-06-16 DIAGNOSIS — R079 Chest pain, unspecified: Secondary | ICD-10-CM | POA: Diagnosis not present

## 2016-06-16 DIAGNOSIS — D649 Anemia, unspecified: Secondary | ICD-10-CM

## 2016-06-16 DIAGNOSIS — I10 Essential (primary) hypertension: Secondary | ICD-10-CM | POA: Diagnosis not present

## 2016-06-16 MED ORDER — PANTOPRAZOLE SODIUM 40 MG PO TBEC: 40.0000 mg | DELAYED_RELEASE_TABLET | Freq: Every day | ORAL | 5 refills | Status: DC

## 2016-06-16 MED ORDER — TRAMADOL HCL 50 MG PO TABS: 50.0000 mg | ORAL_TABLET | Freq: Three times a day (TID) | ORAL | 1 refills | Status: DC | PRN

## 2016-06-16 MED ORDER — SENNA-DOCUSATE SODIUM 8.6-50 MG PO TABS: 1.0000 | ORAL_TABLET | Freq: Every day | ORAL | 3 refills | Status: DC

## 2016-06-16 NOTE — Patient Instructions (Addendum)
Please take all new medication as prescribed - the pill with the senna and stool softner both in it  Please take all new medication as prescribed - the pain medication  Please continue all other medications as before, and refills have been done if requested.  Please have the pharmacy call with any other refills you may need.  Please keep your appointments with your specialists as you may have planned  Please go to the XRAY Department in the Basement (go straight as you get off the elevator) for the x-ray testing tomorrow  You will be contacted by phone if any changes need to be made immediately.  Otherwise, you will receive a letter about your results with an explanation, but please check with MyChart first.  Please remember to sign up for MyChart if you have not done so, as this will be important to you in the future with finding out test results, communicating by private email, and scheduling acute appointments online when needed.

## 2016-06-16 NOTE — Progress Notes (Signed)
Pre visit review using our clinic review tool, if applicable. No additional management support is needed unless otherwise documented below in the visit note. 

## 2016-06-16 NOTE — Progress Notes (Signed)
Subjective:    Patient ID: Tracy [name], female    DOB: January 18, 1927, 83 y.o.   MRN: [id number]  HPI  Here with son with worsening constipation like symptoms with 5 days with intermittent nausea, several episode vomiting without bleeding, abd soreness and bloating, without fever, Denies worsening reflux, dysphagia.  Has intermitent BM and twice had resolution of abd symptoms for about 8 hrs after single BM.  Last BM x 2 days.  + flatus.  Also recent constipation with decresaed pain for 8 hrs twice after BM. Seen at GI with tx with metamucil and miralax per pt family per Dr Tamera PuntArmsbtruster.  Only takes the fluid pill as needed, not daily.  Takes iron twice daily.  Also with some worsening general weakness, got off balance and now with c/o right lateral chest pain after fall, 3 days ago + pleuritic, sharp, without radiation, palp or diaphoresis. Pt denies new neurological symptoms such as new headache, or facial or extremity weakness or numbness Past Medical History:  Diagnosis Date  . Atrial fibrillation (HCC)   . CAROTID STENOSIS   . CVA (cerebrovascular accident) (HCC) 2002  . DIABETES MELLITUS, TYPE II   . DIVERTICULOSIS, COLON   . DM (diabetes mellitus), type 2 (HCC)   . DVT (deep venous thrombosis) (HCC)   . Gastric ulcer   . GERD   . HTN (hypertension)   . Hx of adenomatous colonic polyps 2007  . Hyperlipidemia   . Hypertension   . OSTEOPENIA   . PVD (peripheral vascular disease) (HCC)    recently dx w/ carotid -left  int carotid >90% 50-60% rt int carotid 02/2010  . PVD (peripheral vascular disease) (HCC)    lf  ABI 0.11 and rt 0.43  . Sinus node dysfunction (HCC) 2011  . Stroke [facility](HCC) July 01, 2014   Affected Left side  . Upper GI bleed 02/2016   Past Surgical History:  Procedure Laterality Date  . CAROTID ENDARTERECTOMY    . cataract eye surgery    . CNS vascular stent per Neurosurgeon    . ESOPHAGOGASTRODUODENOSCOPY (EGD) WITH PROPOFOL N/A 02/24/2016   Procedure:  ESOPHAGOGASTRODUODENOSCOPY (EGD) WITH PROPOFOL;  Surgeon: Kathi DerParag Brahmbhatt, MD;  Location: [facility];  Service: Gastroenterology;  Laterality: N/A;  . EYE SURGERY    . ILIAC ARTERY STENT  2011   Bilateral CIA stent  . lazer eye surgery    . RADIOLOGY WITH ANESTHESIA N/A 03/20/2016   Procedure: MRI OF BRAIN;  Surgeon: Medication Radiologist, MD;  Location: [facility] OR;  Service: Radiology;  Laterality: N/A;  . Right Shoulder Surgery  2010  . TUBAL LIGATION      reports that she has quit smoking. She has never used smokeless tobacco. She reports that she does not drink alcohol or use drugs. family history includes Colon cancer in her mother; Diabetes in her sister. No Known Allergies   Review of Systems   Constitutional: Negative for unusual diaphoresis or night sweats HENT: Negative for ear swelling or discharge Eyes: Negative for worsening visual haziness  Respiratory: Negative for choking and stridor.   Gastrointestinal: Negative for distension or worsening eructation Genitourinary: Negative for retention or change in urine volume.  Musculoskeletal: Negative for other MSK pain or swelling Skin: Negative for color change and worsening wound Neurological: Negative for tremors and numbness other than noted  Psychiatric/Behavioral: Negative for decreased concentration or agitation other than above   All otherwise neg per pt     Objective:   Physical Exam BP  120/70   Pulse 78   Temp 97.5 F (36.4 C)   SpO2 95%  VS noted,  Non toxic Constitutional: Pt appears in no apparent distress HENT: Head: NCAT.  Right Ear: External ear normal.  Left Ear: External ear normal.  Eyes: . Pupils are equal, round, and reactive to light. Conjunctivae and EOM are normal Neck: Normal range of motion. Neck supple. No neck LA Cardiovascular: Normal rate and regular rhythm.   Pulmonary/Chest: Effort normal and breath sounds without rales or wheezing.  Abd:  Soft, NT, firm and slight distended + BS, no  guarding or rebound Neurological: Pt is alert. Not confused , motor grossly intact Skin: Skin is warm. No rash, no LE edema Psychiatric: Pt behavior is normal. No agitation.  No other exam findings     Assessment & Plan:

## 2016-06-17 ENCOUNTER — Other Ambulatory Visit: Payer: Self-pay | Admitting: *Deleted

## 2016-06-17 ENCOUNTER — Encounter: Payer: Self-pay | Admitting: Internal Medicine

## 2016-06-17 ENCOUNTER — Other Ambulatory Visit: Payer: Self-pay | Admitting: Internal Medicine

## 2016-06-17 ENCOUNTER — Ambulatory Visit (INDEPENDENT_AMBULATORY_CARE_PROVIDER_SITE_OTHER)
Admission: RE | Admit: 2016-06-17 | Discharge: 2016-06-17 | Disposition: A | Discharge status: Home / Self Care | Payer: Medicare Other | Source: Ambulatory Visit | Attending: Internal Medicine | Admitting: Internal Medicine

## 2016-06-17 DIAGNOSIS — I482 Chronic atrial fibrillation, unspecified: Secondary | ICD-10-CM

## 2016-06-17 DIAGNOSIS — R109 Unspecified abdominal pain: Secondary | ICD-10-CM | POA: Diagnosis not present

## 2016-06-17 DIAGNOSIS — R1031 Right lower quadrant pain: Secondary | ICD-10-CM | POA: Diagnosis not present

## 2016-06-17 DIAGNOSIS — I739 Peripheral vascular disease, unspecified: Secondary | ICD-10-CM

## 2016-06-17 MED ORDER — ATORVASTATIN CALCIUM 10 MG PO TABS: ORAL_TABLET | ORAL | 2 refills | Status: DC

## 2016-06-17 NOTE — Telephone Encounter (Signed)
Requested Prescriptions   Pending Prescriptions Disp Refills  . aspirin 81 MG EC tablet [Pharmacy Med Name: ASPIRIN EC 81 MG TABLET] 30 tablet 1    Sig: 1 TABLET DAILY WITH FOOD   Last OV 05/2016

## 2016-06-19 DIAGNOSIS — R109 Unspecified abdominal pain: Secondary | ICD-10-CM | POA: Insufficient documentation

## 2016-06-19 DIAGNOSIS — R079 Chest pain, unspecified: Secondary | ICD-10-CM | POA: Insufficient documentation

## 2016-06-19 NOTE — Assessment & Plan Note (Signed)
s/p fall, no overt injury but cant r/o rib fx, for cxr

## 2016-06-19 NOTE — Assessment & Plan Note (Signed)
stable overall by history and exam, recent data reviewed with pt, and pt to continue medical treatment as before,  to f/u any worsening symptoms or concerns BP Readings from Last 3 Encounters:  06/16/16 120/70  06/01/16 108/82  05/24/16 (!) 114/57

## 2016-06-19 NOTE — Assessment & Plan Note (Addendum)
Etiology unclear, suspect difficult to control constipation, cant r/o partial or other obstruction completely, will consdier CT but son elects for films today to f/o obstruction, also for labs as ordered, for laxative prn as ordered

## 2016-06-21 ENCOUNTER — Telehealth: Payer: Self-pay | Admitting: Internal Medicine

## 2016-06-21 NOTE — Telephone Encounter (Signed)
Pt daughter Olegario Messier(Kathy) called wanting results from the Xray done 3/15, daughter is not on DPR, Pt does not have one filled out, please call Pt with results, only son is listed on DPR but its for Gastro, needs to be informed she needs one filled out for us.

## 2016-06-22 NOTE — Telephone Encounter (Signed)
Tracy [name] was informed of patients Xray results and expressed understanding.

## 2016-06-22 NOTE — Telephone Encounter (Signed)
Patient not feeling well today, could not come to phone, advised daughter she needs to be added to patient's DPR on next visit, but patient's son could call back to discuss xray results----there are no abnormal findings and no change in tx or any other instructions to be given per dr Jonny Ruizjohn

## 2016-06-22 NOTE — Telephone Encounter (Signed)
Pt son, Tracy [name], called to get results for xray done 3/15.  Please call back at [phone].

## 2016-06-30 ENCOUNTER — Encounter (HOSPITAL_COMMUNITY): Payer: Self-pay | Admitting: Emergency Medicine

## 2016-06-30 ENCOUNTER — Inpatient Hospital Stay (HOSPITAL_COMMUNITY)
Admission: EM | Admit: 2016-06-30 | Discharge: 2016-07-12 | DRG: 871 | Disposition: A | Discharge status: Hospice | Payer: Medicare Other | Attending: Internal Medicine | Admitting: Internal Medicine

## 2016-06-30 ENCOUNTER — Emergency Department (HOSPITAL_COMMUNITY): Payer: Medicare Other

## 2016-06-30 DIAGNOSIS — F05 Delirium due to known physiological condition: Secondary | ICD-10-CM | POA: Diagnosis not present

## 2016-06-30 DIAGNOSIS — I420 Dilated cardiomyopathy: Secondary | ICD-10-CM | POA: Diagnosis not present

## 2016-06-30 DIAGNOSIS — R350 Frequency of micturition: Secondary | ICD-10-CM | POA: Diagnosis not present

## 2016-06-30 DIAGNOSIS — D638 Anemia in other chronic diseases classified elsewhere: Secondary | ICD-10-CM | POA: Diagnosis not present

## 2016-06-30 DIAGNOSIS — F0390 Unspecified dementia without behavioral disturbance: Secondary | ICD-10-CM | POA: Diagnosis present

## 2016-06-30 DIAGNOSIS — R1312 Dysphagia, oropharyngeal phase: Secondary | ICD-10-CM | POA: Diagnosis not present

## 2016-06-30 DIAGNOSIS — E876 Hypokalemia: Secondary | ICD-10-CM | POA: Diagnosis present

## 2016-06-30 DIAGNOSIS — Z8673 Personal history of transient ischemic attack (TIA), and cerebral infarction without residual deficits: Secondary | ICD-10-CM

## 2016-06-30 DIAGNOSIS — K810 Acute cholecystitis: Secondary | ICD-10-CM | POA: Diagnosis not present

## 2016-06-30 DIAGNOSIS — R1111 Vomiting without nausea: Secondary | ICD-10-CM | POA: Diagnosis not present

## 2016-06-30 DIAGNOSIS — Z87891 Personal history of nicotine dependence: Secondary | ICD-10-CM | POA: Diagnosis not present

## 2016-06-30 DIAGNOSIS — K8000 Calculus of gallbladder with acute cholecystitis without obstruction: Secondary | ICD-10-CM | POA: Diagnosis not present

## 2016-06-30 DIAGNOSIS — E785 Hyperlipidemia, unspecified: Secondary | ICD-10-CM | POA: Diagnosis present

## 2016-06-30 DIAGNOSIS — Z7189 Other specified counseling: Secondary | ICD-10-CM | POA: Diagnosis not present

## 2016-06-30 DIAGNOSIS — K5730 Diverticulosis of large intestine without perforation or abscess without bleeding: Secondary | ICD-10-CM | POA: Diagnosis present

## 2016-06-30 DIAGNOSIS — Z66 Do not resuscitate: Secondary | ICD-10-CM | POA: Diagnosis not present

## 2016-06-30 DIAGNOSIS — R630 Anorexia: Secondary | ICD-10-CM | POA: Diagnosis not present

## 2016-06-30 DIAGNOSIS — I110 Hypertensive heart disease with heart failure: Secondary | ICD-10-CM | POA: Diagnosis not present

## 2016-06-30 DIAGNOSIS — Z6821 Body mass index (BMI) 21.0-21.9, adult: Secondary | ICD-10-CM

## 2016-06-30 DIAGNOSIS — N179 Acute kidney failure, unspecified: Secondary | ICD-10-CM

## 2016-06-30 DIAGNOSIS — D72829 Elevated white blood cell count, unspecified: Secondary | ICD-10-CM | POA: Diagnosis not present

## 2016-06-30 DIAGNOSIS — E1151 Type 2 diabetes mellitus with diabetic peripheral angiopathy without gangrene: Secondary | ICD-10-CM | POA: Diagnosis not present

## 2016-06-30 DIAGNOSIS — K819 Cholecystitis, unspecified: Secondary | ICD-10-CM | POA: Diagnosis not present

## 2016-06-30 DIAGNOSIS — E43 Unspecified severe protein-calorie malnutrition: Secondary | ICD-10-CM | POA: Diagnosis present

## 2016-06-30 DIAGNOSIS — R109 Unspecified abdominal pain: Secondary | ICD-10-CM | POA: Diagnosis not present

## 2016-06-30 DIAGNOSIS — I272 Pulmonary hypertension, unspecified: Secondary | ICD-10-CM | POA: Diagnosis present

## 2016-06-30 DIAGNOSIS — A419 Sepsis, unspecified organism: Secondary | ICD-10-CM | POA: Diagnosis not present

## 2016-06-30 DIAGNOSIS — Z515 Encounter for palliative care: Secondary | ICD-10-CM | POA: Diagnosis not present

## 2016-06-30 DIAGNOSIS — I5033 Acute on chronic diastolic (congestive) heart failure: Secondary | ICD-10-CM | POA: Diagnosis present

## 2016-06-30 DIAGNOSIS — E860 Dehydration: Secondary | ICD-10-CM | POA: Diagnosis present

## 2016-06-30 DIAGNOSIS — Z4682 Encounter for fitting and adjustment of non-vascular catheter: Secondary | ICD-10-CM | POA: Diagnosis not present

## 2016-06-30 DIAGNOSIS — Z86718 Personal history of other venous thrombosis and embolism: Secondary | ICD-10-CM

## 2016-06-30 DIAGNOSIS — R1084 Generalized abdominal pain: Secondary | ICD-10-CM

## 2016-06-30 DIAGNOSIS — E870 Hyperosmolality and hypernatremia: Secondary | ICD-10-CM | POA: Diagnosis not present

## 2016-06-30 DIAGNOSIS — K219 Gastro-esophageal reflux disease without esophagitis: Secondary | ICD-10-CM | POA: Diagnosis present

## 2016-06-30 DIAGNOSIS — I482 Chronic atrial fibrillation, unspecified: Secondary | ICD-10-CM | POA: Diagnosis present

## 2016-06-30 DIAGNOSIS — R0902 Hypoxemia: Secondary | ICD-10-CM

## 2016-06-30 DIAGNOSIS — Z4659 Encounter for fitting and adjustment of other gastrointestinal appliance and device: Secondary | ICD-10-CM

## 2016-06-30 DIAGNOSIS — I4891 Unspecified atrial fibrillation: Secondary | ICD-10-CM | POA: Diagnosis not present

## 2016-06-30 DIAGNOSIS — Z7982 Long term (current) use of aspirin: Secondary | ICD-10-CM

## 2016-06-30 DIAGNOSIS — R410 Disorientation, unspecified: Secondary | ICD-10-CM | POA: Diagnosis present

## 2016-06-30 DIAGNOSIS — Z79899 Other long term (current) drug therapy: Secondary | ICD-10-CM

## 2016-06-30 DIAGNOSIS — R1011 Right upper quadrant pain: Secondary | ICD-10-CM | POA: Diagnosis not present

## 2016-06-30 DIAGNOSIS — R0602 Shortness of breath: Secondary | ICD-10-CM | POA: Diagnosis not present

## 2016-06-30 LAB — I-STAT CG4 LACTIC ACID, ED
LACTIC ACID, VENOUS: 2.23 mmol/L — AB (ref 0.5–1.9)
Lactic Acid, Venous: 2.31 mmol/L (ref 0.5–1.9)

## 2016-06-30 LAB — CBC
HEMATOCRIT: 28.9 % — AB (ref 36.0–46.0)
HEMATOCRIT: 26 % — AB (ref 36.0–46.0)
HEMOGLOBIN: 9.5 g/dL — AB (ref 12.0–15.0)
HEMOGLOBIN: 8.5 g/dL — AB (ref 12.0–15.0)
MCH: 27.5 pg (ref 26.0–34.0)
MCH: 27.5 pg (ref 26.0–34.0)
MCHC: 32.9 g/dL (ref 30.0–36.0)
MCHC: 32.7 g/dL (ref 30.0–36.0)
MCV: 83.8 fL (ref 78.0–100.0)
MCV: 84.1 fL (ref 78.0–100.0)
Platelets: 593 10*3/uL — ABNORMAL HIGH (ref 150–400)
Platelets: 507 10*3/uL — ABNORMAL HIGH (ref 150–400)
RBC: 3.45 MIL/uL — AB (ref 3.87–5.11)
RBC: 3.09 MIL/uL — AB (ref 3.87–5.11)
RDW: 20.5 % — ABNORMAL HIGH (ref 11.5–15.5)
RDW: 20.5 % — ABNORMAL HIGH (ref 11.5–15.5)
WBC: 20 10*3/uL — AB (ref 4.0–10.5)
WBC: 17.2 10*3/uL — AB (ref 4.0–10.5)

## 2016-06-30 LAB — COMPREHENSIVE METABOLIC PANEL
ALBUMIN: 1.9 g/dL — AB (ref 3.5–5.0)
ALT: 24 U/L (ref 14–54)
AST: 57 U/L — AB (ref 15–41)
Alkaline Phosphatase: 70 U/L (ref 38–126)
Anion gap: 15 (ref 5–15)
BILIRUBIN TOTAL: 2.5 mg/dL — AB (ref 0.3–1.2)
BUN: 21 mg/dL — ABNORMAL HIGH (ref 6–20)
CALCIUM: 8.3 mg/dL — AB (ref 8.9–10.3)
CO2: 27 mmol/L (ref 22–32)
Chloride: 102 mmol/L (ref 101–111)
Creatinine, Ser: 1.98 mg/dL — ABNORMAL HIGH (ref 0.44–1.00)
GFR calc Af Amer: 25 mL/min — ABNORMAL LOW (ref >60)
GFR calc non Af Amer: 22 mL/min — ABNORMAL LOW (ref >60)
GLUCOSE: 66 mg/dL (ref 65–99)
POTASSIUM: 3.2 mmol/L — AB (ref 3.5–5.1)
SODIUM: 144 mmol/L (ref 135–145)
TOTAL PROTEIN: 5.5 g/dL — AB (ref 6.5–8.1)

## 2016-06-30 LAB — LACTIC ACID, PLASMA: Lactic Acid, Venous: 2.1 mmol/L (ref 0.5–1.9)

## 2016-06-30 LAB — LIPASE, BLOOD: Lipase: <10 U/L — ABNORMAL LOW (ref 11–51)

## 2016-06-30 LAB — URINALYSIS, ROUTINE W REFLEX MICROSCOPIC
Bilirubin Urine: NEGATIVE
Glucose, UA: NEGATIVE mg/dL
HGB URINE DIPSTICK: NEGATIVE
KETONES UR: NEGATIVE mg/dL
LEUKOCYTES UA: NEGATIVE
NITRITE: NEGATIVE
PH: 5 (ref 5.0–8.0)
Protein, ur: 30 mg/dL — AB
Specific Gravity, Urine: 1.02 (ref 1.005–1.030)

## 2016-06-30 LAB — PROTIME-INR
INR: 1.73
Prothrombin Time: 20.5 seconds — ABNORMAL HIGH (ref 11.4–15.2)

## 2016-06-30 LAB — TSH: TSH: 2.401 u[IU]/mL (ref 0.350–4.500)

## 2016-06-30 MED ORDER — SODIUM CHLORIDE 0.9 % IV BOLUS (SEPSIS)
1000.0000 mL | Freq: Once | INTRAVENOUS | Status: AC
  Administered 2016-06-30: 1000 mL via INTRAVENOUS

## 2016-06-30 MED ORDER — SODIUM CHLORIDE 0.9 % IV SOLN
INTRAVENOUS | Status: DC
  Administered 2016-06-30: 22:00:00 via INTRAVENOUS

## 2016-06-30 MED ORDER — ACETAMINOPHEN 325 MG PO TABS
650.0000 mg | ORAL_TABLET | Freq: Four times a day (QID) | ORAL | Status: DC | PRN
  Administered 2016-07-08: 650 mg via ORAL
  Filled 2016-06-30: qty 2

## 2016-06-30 MED ORDER — ASPIRIN EC 81 MG PO TBEC
81.0000 mg | DELAYED_RELEASE_TABLET | Freq: Every day | ORAL | Status: DC
  Administered 2016-06-30 – 2016-07-09 (×6): 81 mg via ORAL
  Filled 2016-06-30 (×7): qty 1

## 2016-06-30 MED ORDER — PIPERACILLIN-TAZOBACTAM IN DEX 2-0.25 GM/50ML IV SOLN
2.2500 g | Freq: Three times a day (TID) | INTRAVENOUS | Status: DC
  Administered 2016-07-01 – 2016-07-04 (×9): 2.25 g via INTRAVENOUS
  Filled 2016-06-30 (×13): qty 50

## 2016-06-30 MED ORDER — PENTOXIFYLLINE ER 400 MG PO TBCR
400.0000 mg | EXTENDED_RELEASE_TABLET | Freq: Three times a day (TID) | ORAL | Status: DC
  Administered 2016-07-01 – 2016-07-09 (×4): 400 mg via ORAL
  Filled 2016-06-30 (×30): qty 1

## 2016-06-30 MED ORDER — DILTIAZEM HCL ER COATED BEADS 180 MG PO CP24
180.0000 mg | ORAL_CAPSULE | Freq: Every day | ORAL | Status: DC
  Administered 2016-06-30 – 2016-07-07 (×6): 180 mg via ORAL
  Filled 2016-06-30 (×7): qty 1

## 2016-06-30 MED ORDER — PIPERACILLIN-TAZOBACTAM 3.375 G IVPB 30 MIN: 3.3750 g | Freq: Once | INTRAVENOUS | Status: DC

## 2016-06-30 MED ORDER — PROSIGHT PO TABS
1.0000 | ORAL_TABLET | Freq: Two times a day (BID) | ORAL | Status: DC
  Administered 2016-06-30 – 2016-07-09 (×3): 1 via ORAL
  Filled 2016-06-30 (×7): qty 1

## 2016-06-30 MED ORDER — DONEPEZIL HCL 5 MG PO TABS
5.0000 mg | ORAL_TABLET | Freq: Every day | ORAL | Status: DC
  Administered 2016-06-30 – 2016-07-01 (×2): 5 mg via ORAL
  Filled 2016-06-30 (×4): qty 1

## 2016-06-30 MED ORDER — OLANZAPINE 2.5 MG PO TABS
2.5000 mg | ORAL_TABLET | Freq: Every day | ORAL | Status: DC
  Administered 2016-06-30: 2.5 mg via ORAL
  Filled 2016-06-30 (×3): qty 1

## 2016-06-30 MED ORDER — IOPAMIDOL (ISOVUE-300) INJECTION 61%
INTRAVENOUS | Status: AC
  Filled 2016-06-30: qty 30

## 2016-06-30 MED ORDER — MORPHINE SULFATE (PF) 2 MG/ML IV SOLN
1.0000 mg | INTRAVENOUS | Status: DC | PRN
  Administered 2016-07-01 – 2016-07-04 (×4): 1 mg via INTRAVENOUS
  Filled 2016-06-30 (×3): qty 1

## 2016-06-30 MED ORDER — HEPARIN SODIUM (PORCINE) 5000 UNIT/ML IJ SOLN
5000.0000 [IU] | Freq: Three times a day (TID) | INTRAMUSCULAR | Status: DC
  Administered 2016-06-30 – 2016-07-02 (×5): 5000 [IU] via SUBCUTANEOUS
  Filled 2016-06-30 (×5): qty 1

## 2016-06-30 MED ORDER — ADULT MULTIVITAMIN W/MINERALS CH
1.0000 | ORAL_TABLET | Freq: Every day | ORAL | Status: DC
  Administered 2016-06-30: 1 via ORAL
  Filled 2016-06-30 (×3): qty 1

## 2016-06-30 MED ORDER — ONDANSETRON HCL 4 MG PO TABS: 4.0000 mg | ORAL_TABLET | Freq: Four times a day (QID) | ORAL | Status: DC | PRN

## 2016-06-30 MED ORDER — ONDANSETRON HCL 4 MG/2ML IJ SOLN: 4.0000 mg | Freq: Four times a day (QID) | INTRAMUSCULAR | Status: DC | PRN

## 2016-06-30 MED ORDER — ACETAMINOPHEN 650 MG RE SUPP
650.0000 mg | Freq: Four times a day (QID) | RECTAL | Status: DC | PRN
  Administered 2016-07-06: 650 mg via RECTAL
  Filled 2016-06-30: qty 1

## 2016-06-30 MED ORDER — PIPERACILLIN-TAZOBACTAM 3.375 G IVPB 30 MIN
3.3750 g | Freq: Once | INTRAVENOUS | Status: AC
  Administered 2016-06-30: 3.375 g via INTRAVENOUS
  Filled 2016-06-30: qty 50

## 2016-06-30 MED ORDER — SODIUM CHLORIDE 0.9 % IV BOLUS (SEPSIS)
500.0000 mL | Freq: Once | INTRAVENOUS | Status: AC
  Administered 2016-06-30: 500 mL via INTRAVENOUS

## 2016-06-30 MED ORDER — SODIUM CHLORIDE 0.9% FLUSH
3.0000 mL | Freq: Two times a day (BID) | INTRAVENOUS | Status: DC
  Administered 2016-06-30 – 2016-07-12 (×14): 3 mL via INTRAVENOUS

## 2016-06-30 MED ORDER — METOPROLOL TARTRATE 12.5 MG HALF TABLET
12.5000 mg | ORAL_TABLET | Freq: Two times a day (BID) | ORAL | Status: DC
  Administered 2016-06-30 – 2016-07-09 (×12): 12.5 mg via ORAL
  Filled 2016-06-30 (×15): qty 1

## 2016-06-30 MED ORDER — METOPROLOL TARTRATE 5 MG/5ML IV SOLN
5.0000 mg | Freq: Once | INTRAVENOUS | Status: AC
  Administered 2016-06-30: 5 mg via INTRAVENOUS
  Filled 2016-06-30: qty 5

## 2016-06-30 MED ORDER — PRESERVISION AREDS 2 PO CAPS: 1.0000 | ORAL_CAPSULE | Freq: Two times a day (BID) | ORAL | Status: DC

## 2016-06-30 MED ORDER — HYDROCODONE-ACETAMINOPHEN 5-325 MG PO TABS
1.0000 | ORAL_TABLET | ORAL | Status: DC | PRN
  Administered 2016-07-01: 1 via ORAL
  Filled 2016-06-30: qty 1

## 2016-06-30 NOTE — ED Notes (Signed)
I-stat lactic acid result given to Dr. Denton LankSteinl

## 2016-06-30 NOTE — ED Notes (Signed)
Family updated about delay and is understanding and appreciative

## 2016-06-30 NOTE — Progress Notes (Signed)
New patient admitted into 2w30 from the ED. On arrival, accompanied by family members. Patient alert and oriented to self and place. Vital signs checked and recorded. Placed on cardiac monitor and CCMD notified. Made comfortable in bed safety measures put in place. Will keep monitoring

## 2016-06-30 NOTE — ED Notes (Signed)
Admitting doctor did not page me back, bed placement contacted to assist me in getting the patient to the correct floor

## 2016-06-30 NOTE — ED Notes (Signed)
hospitalist paged x2

## 2016-06-30 NOTE — ED Notes (Signed)
Pt now has bed on 2 W will call report in 20 min

## 2016-06-30 NOTE — ED Notes (Signed)
Nurse 1st notified of lab result

## 2016-06-30 NOTE — ED Notes (Signed)
Dr. Adela LankFloyd Informed of lactic acid of 2.31. No new verbal orders at this time.

## 2016-06-30 NOTE — ED Triage Notes (Signed)
Per family pt with lower abd pain and no urine output x 1 day; per family pt with decreased PO intake and weakness x 2 weeks

## 2016-06-30 NOTE — ED Notes (Signed)
Admitting paged regarding patient not being appropriate for med surge bed, awaiting call back, will continue to monitor

## 2016-06-30 NOTE — Consult Note (Signed)
Reason for Consult:Acute cholecystitis with cholelithiasis Referring Physician: Dr/ Tracy [name] is an 83 y.o. female.  HPI: Per the patient and her family, patient normally active, but last 24 hours or so has had decreased urination, more nauseal and vomiting, and now abdominal pain.  Has not had this before  Past Medical History:  Diagnosis Date  . Atrial fibrillation ([place])   . CAROTID STENOSIS   . CVA (cerebrovascular accident) ([place]) 2002  . DIABETES MELLITUS, TYPE II   . DIVERTICULOSIS, COLON   . DM (diabetes mellitus), type 2 ([place])   . DVT (deep venous thrombosis) ([place])   . Gastric ulcer   . GERD   . HTN (hypertension)   . Hx of adenomatous colonic polyps 2007  . Hyperlipidemia   . Hypertension   . OSTEOPENIA   . PVD (peripheral vascular disease) ([place])    recently dx w/ carotid -left  int carotid >90% 50-60% rt int carotid 02/2010  . PVD (peripheral vascular disease) ([facility])    lf  ABI 0.11 and rt 0.43  . Sinus node dysfunction ([place]) 2011  . Stroke [facility]) July 01, 2014   Affected Left side  . Upper GI bleed 02/2016    Past Surgical History:  Procedure Laterality Date  . CAROTID ENDARTERECTOMY    . cataract eye surgery    . CNS vascular stent per Neurosurgeon    . ESOPHAGOGASTRODUODENOSCOPY (EGD) WITH PROPOFOL N/A 02/24/2016   Procedure: ESOPHAGOGASTRODUODENOSCOPY (EGD) WITH PROPOFOL;  Surgeon: Otis Brace, MD;  Location: [facility];  Service: Gastroenterology;  Laterality: N/A;  . EYE SURGERY    . ILIAC ARTERY STENT  2011   Bilateral CIA stent  . lazer eye surgery    . RADIOLOGY WITH ANESTHESIA N/A 03/20/2016   Procedure: MRI OF BRAIN;  Surgeon: Medication Radiologist, MD;  Location: [facility];  Service: Radiology;  Laterality: N/A;  . Right Shoulder Surgery  2010  . TUBAL LIGATION      Family History  Problem Relation Age of Onset  . Colon cancer Mother   . Diabetes Sister     Amputation    Social History:  reports that Tracy [name] has quit smoking.  Tracy [name] has never used smokeless tobacco. Tracy [name] reports that Tracy [name] does not drink alcohol or use drugs.  Allergies: No Known Allergies  Medications: I have reviewed the patient's current medications.  Results for orders placed or performed during the hospital encounter of 06/30/16 (from the past 48 hour(s))  Lipase, blood     Status: Abnormal   Collection Time: 06/30/16 12:10 PM  Result Value Ref Range   Lipase <10 (L) 11 - 51 U/L    Comment: REPEATED TO VERIFY  Comprehensive metabolic panel     Status: Abnormal   Collection Time: 06/30/16 12:10 PM  Result Value Ref Range   Sodium 144 135 - 145 mmol/L   Potassium 3.2 (L) 3.5 - 5.1 mmol/L   Chloride 102 101 - 111 mmol/L   CO2 27 22 - 32 mmol/L   Glucose, Bld 66 65 - 99 mg/dL   BUN 21 (H) 6 - 20 mg/dL   Creatinine, Ser 1.98 (H) 0.44 - 1.00 mg/dL   Calcium 8.3 (L) 8.9 - 10.3 mg/dL   Total Protein 5.5 (L) 6.5 - 8.1 g/dL   Albumin 1.9 (L) 3.5 - 5.0 g/dL   AST 57 (H) 15 - 41 U/L   ALT 24 14 - 54 U/L   Alkaline Phosphatase 70 38 - 126 U/L  Total Bilirubin 2.5 (H) 0.3 - 1.2 mg/dL   GFR calc non Af Amer 22 (L) >60 mL/min   GFR calc Af Amer 25 (L) >60 mL/min    Comment: (NOTE) The eGFR has been calculated using the CKD EPI equation. This calculation has not been validated in all clinical situations. eGFR's persistently <60 mL/min signify possible Chronic Kidney Disease.    Anion gap 15 5 - 15  CBC     Status: Abnormal   Collection Time: 06/30/16 12:10 PM  Result Value Ref Range   WBC 20.0 (H) 4.0 - 10.5 K/uL   RBC 3.45 (L) 3.87 - 5.11 MIL/uL   Hemoglobin 9.5 (L) 12.0 - 15.0 g/dL   HCT 42.5 (L) 95.6 - 38.7 %   MCV 83.8 78.0 - 100.0 fL   MCH 27.5 26.0 - 34.0 pg   MCHC 32.9 30.0 - 36.0 g/dL   RDW 56.4 (H) 33.2 - 95.1 %   Platelets 593 (H) 150 - 400 K/uL  I-Stat CG4 Lactic Acid, ED     Status: Abnormal   Collection Time: 06/30/16 12:21 PM  Result Value Ref Range   Lactic Acid, Venous 2.31 (HH) 0.5 - 1.9 mmol/L   Comment NOTIFIED  PHYSICIAN   I-Stat CG4 Lactic Acid, ED     Status: Abnormal   Collection Time: 06/30/16  3:41 PM  Result Value Ref Range   Lactic Acid, Venous 2.23 (HH) 0.5 - 1.9 mmol/L   Comment NOTIFIED PHYSICIAN   Urinalysis, Routine w reflex microscopic     Status: Abnormal   Collection Time: 06/30/16  4:38 PM  Result Value Ref Range   Color, Urine AMBER (A) YELLOW    Comment: BIOCHEMICALS MAY BE AFFECTED BY COLOR   APPearance CLEAR CLEAR   Specific Gravity, Urine 1.020 1.005 - 1.030   pH 5.0 5.0 - 8.0   Glucose, UA NEGATIVE NEGATIVE mg/dL   Hgb urine dipstick NEGATIVE NEGATIVE   Bilirubin Urine NEGATIVE NEGATIVE   Ketones, ur NEGATIVE NEGATIVE mg/dL   Protein, ur 30 (A) NEGATIVE mg/dL   Nitrite NEGATIVE NEGATIVE   Leukocytes, UA NEGATIVE NEGATIVE   RBC / HPF 0-5 0 - 5 RBC/hpf   WBC, UA 0-5 0 - 5 WBC/hpf   Bacteria, UA RARE (A) NONE SEEN   Squamous Epithelial / LPF 0-5 (A) NONE SEEN   Hyaline Casts, UA PRESENT    Non Squamous Epithelial 0-5 (A) NONE SEEN    Ct Abdomen Pelvis Wo Contrast  Result Date: 06/30/2016 CLINICAL DATA:  Lower abdominal pain. Decreased p.o. intake. Weakness for 2 weeks. Vomiting. No urine output. EXAM: CT ABDOMEN AND PELVIS WITHOUT CONTRAST TECHNIQUE: Multidetector CT imaging of the abdomen and pelvis was performed following the standard protocol without IV contrast. COMPARISON:  CT of the abdomen and pelvis 03/15/2016. FINDINGS: Lower chest: Extensive pleural calcifications are again noted. Asymmetric left lower lobe airspace disease is present. Left-sided pleural thickening and a small effusion are noted. The heart is enlarged. Coronary artery calcifications are present. No significant right pleural or pericardial effusion is present. Hepatobiliary: No focal hepatic lesions are present. The gallbladder is hyperdense likely reflecting multiple stones. There is increased edema within the gallbladder wall. The common bile duct is within normal limits for age. There is  extensive fluid surrounding gallbladder and liver. Pancreas: Unremarkable. No pancreatic ductal dilatation or surrounding inflammatory changes. Spleen: Splenic calcifications are present. There is free fluid along the edge of the spleen. Adrenals/Urinary Tract: Adrenal glands are normal bilaterally. Bilateral renal atrophy  is again seen. No focal lesions are present. Ureters are within normal limits. The urinary bladder is unremarkable. Stomach/Bowel: The a moderate hiatal hernia present. The stomach and duodenum are otherwise within normal limits. The small bowel is unremarkable. Diverticular changes are present within the ascending colon without inflammation centered about the colon. The transverse colon is mostly collapsed. Additional diverticular changes are present the descending and sigmoid colon without focal inflammation. Vascular/Lymphatic: Dense atherosclerotic calcifications are present in the aorta and branch vessels. There are dense calcifications which may reflect stenoses at the origins of renal arteries and SMA. Iliac artery stenting is noted. Reproductive: Within normal limits for age. Other: Free fluid extends into the anatomic pelvis. There is no free air. Musculoskeletal: Endplate degenerative changes are most evident L5-S1. No focal lytic or blastic lesion present otherwise. The pelvis is intact. The hips are unremarkable. IMPRESSION: 1. Extensive free fluid inflammatory changes about the neck of the gallbladder suggesting acute cholecystitis in the setting of known gallstones and increased gallbladder wall edema. Gallbladder ultrasound may be useful further evaluation. 2. Diffuse abdominal ascites with free fluid about the liver and spleen and extending into the anatomic pelvis. 3. Ascending and descending colonic diverticulosis without focal inflammation about the colon to suggest diverticulitis. 4. Extensive atherosclerotic disease as described. 5. Multilevel spondylosis in the lumbar spine.  6. Asymmetric left lower lobe airspace disease and effusion. Infection is not excluded. 7. Cardiomegaly without failure. 8. Moderate-sized hiatal hernia. Electronically Signed   By: San Morelle M.D.   On: 06/30/2016 16:44    Review of Systems  Gastrointestinal: Positive for abdominal pain, nausea and vomiting.       Poor appetite  Genitourinary:       Poor urine output  All other systems reviewed and are negative.  Blood pressure (!) 147/81, pulse (!) 34, temperature 98.6 F (37 C), temperature source Oral, resp. rate (!) 21, SpO2 100 %. Physical Exam  Constitutional: Tracy [name] appears well-developed and well-nourished.  Cachectic   HENT:  Head: Normocephalic and atraumatic.  Eyes: EOM are normal.  Neck: Normal range of motion. Neck supple.  Cardiovascular: Intact distal pulses.  An irregularly irregular rhythm present. Tachycardia present.   Murmur heard.  Systolic murmur is present with a grade of 3/6  Respiratory: Effort normal and breath sounds normal.  GI: Soft. Bowel sounds are decreased. There is tenderness in the right upper quadrant, right lower quadrant and epigastric area. There is rebound, guarding and positive Murphy's sign. There is no rigidity and no CVA tenderness.    Neurological: Tracy [name] is alert.  Psychiatric: Thought content normal. Her mood appears anxious. Her speech is rapid and/or pressured and slurred. Tracy [name] is agitated.    Assessment/Plan: Abdominal pain in the RUQ with CT suggesting acute cholecystitis and cholelithiasis.   The patient is tender in the RUQ to clinically support the diagnosis.  Her lactic acid level is elevated.  PT/INR is pending.  At her age and with her co morbidities, Tracy [name] would be best treated with a percutaneous cholecystostomy tube if Tracy [name] does not improve with IV antibiotics only.  HIDA scan has been ordered.  Will add her name to the acute care general surgery service and follow with you.  Kaelon Weekes 06/30/2016, 6:50 PM

## 2016-06-30 NOTE — ED Provider Notes (Signed)
[facility]-EMERGENCY DEPT Provider Note   CSN: [id number] Arrival date & time: 06/30/16  1147     History   Chief Complaint Chief Complaint  Patient presents with  . Abdominal Pain    HPI Tracy [name] is a 83 y.o. female.  Patient presents with 1 day history of abdominal pain, decreased urine output and vomiting dark brown contents. History is provided by the patient's son and daughter.  She has been experiencing decreased appetite and generalized weakness for the past week. Son states since yesterday morning she has only had a small amount of urine output. She has vomited twice, with the contents appearing dark brown "with chunks." Her abdominal pain is epigastric and suprapubic. No fevers but the son noticed her back was soaking wet in her bed due to sweating. She was seen by her PCP about a month ago for abdominal pain. X-rays showed she was constipated. Since then, she has been taking a laxative and returned to regular BM everyday.  Denies blood in stool, hematuria, history of abdominal surgery, changes in medication. Patient has a history of diverticulosis.      Past Medical History:  Diagnosis Date  . Atrial fibrillation (HCC)   . CAROTID STENOSIS   . CVA (cerebrovascular accident) (HCC) 2002  . DIABETES MELLITUS, TYPE II   . DIVERTICULOSIS, COLON   . DM (diabetes mellitus), type 2 (HCC)   . DVT (deep venous thrombosis) (HCC)   . Gastric ulcer   . GERD   . HTN (hypertension)   . Hx of adenomatous colonic polyps 2007  . Hyperlipidemia   . Hypertension   . OSTEOPENIA   . PVD (peripheral vascular disease) (HCC)    recently dx w/ carotid -left  int carotid >90% 50-60% rt int carotid 02/2010  . PVD (peripheral vascular disease) (HCC)    lf  ABI 0.11 and rt 0.43  . Sinus node dysfunction (HCC) 2011  . Stroke [facility](HCC) July 01, 2014   Affected Left side  . Upper GI bleed 02/2016    Patient Active Problem List   Diagnosis Date Noted  . Protein-calorie malnutrition,  severe 07/01/2016  . Acute cholecystitis 06/30/2016  . Sepsis (HCC) 06/30/2016  . Abdominal pain 06/19/2016  . Chest pain 06/19/2016  . Hyperglycemia 06/01/2016  . Long-term (current) use of anticoagulants 04/29/2016  . Acute GI bleeding 04/19/2016  . BRBPR (bright red blood per rectum) 04/19/2016  . Encounter for therapeutic drug monitoring 04/13/2016  . Dehydration   . Urine incontinence 03/11/2016  . Hypokalemia 03/11/2016  . Loss of appetite 03/11/2016  . Leukocytosis 03/11/2016  . AKI (acute kidney injury) (HCC) 03/11/2016  . Gastric ulcer 03/04/2016  . Acute blood loss anemia   . Chronic diastolic CHF (congestive heart failure) (HCC)   . Dilated cardiomyopathy (HCC)   . Chronic atrial fibrillation (HCC)   . Pulmonary HTN   . Right lumbar radiculopathy 02/12/2016  . Superficial bruising of foot 02/12/2016  . Irregular heart beats 02/04/2016  . General weakness 02/04/2016  . Atrial fibrillation with RVR (HCC) 02/04/2016  . Anemia, iron deficiency 02/04/2016  . Hypercalcemia 02/04/2016  . Acute kidney injury (HCC) 02/04/2016  . Right sided sciatica 11/18/2015  . Trigger point of right shoulder region 05/13/2015  . Cerebrovascular accident (CVA) due to thrombosis of right posterior cerebral artery (HCC) 04/17/2015  . PVD (peripheral vascular disease) (HCC) 04/17/2015  . Overactive bladder 11/15/2014  . Weakness 10/24/2014  . Cerebral infarction due to thrombosis of right middle cerebral artery (  HCC) 10/17/2014  . Monoplegia of left lower extremity (HCC) 08/16/2014  . Gait disturbance, post-stroke 08/16/2014  . Altered sensation due to stroke (HCC) 07/15/2014  . Hip pain, acute   . Bradycardia 07/01/2014  . Elevated troponin 07/01/2014  . Dysuria 07/01/2014  . Constipation 07/01/2014  . Right ankle pain 01/29/2014  . Rotator cuff tear arthropathy of right shoulder 06/19/2013  . Left rotator cuff tear arthropathy 05/17/2013  . Aneurysm of iliac artery (HCC) 01/26/2012    . Left shoulder pain 02/09/2011  . Preventative health care 08/02/2010  . Sinus bradycardia 05/29/2010  . SICK SINUS SYNDROME 03/10/2010  . Occlusion and stenosis of carotid artery 03/10/2010  . PVD WITH CLAUDICATION 02/23/2010  . NECK MASS 10/01/2008  . DIVERTICULOSIS, COLON 08/01/2007  . SHOULDER PAIN, RIGHT 08/01/2007  . History of colonic polyps 08/01/2007  . GERD 12/25/2006  . Disorder of bone and cartilage 12/25/2006  . Hyperlipidemia 11/29/2006  . Essential hypertension 11/29/2006    Past Surgical History:  Procedure Laterality Date  . CAROTID ENDARTERECTOMY    . cataract eye surgery    . CNS vascular stent per Neurosurgeon    . ESOPHAGOGASTRODUODENOSCOPY (EGD) WITH PROPOFOL N/A 02/24/2016   Procedure: ESOPHAGOGASTRODUODENOSCOPY (EGD) WITH PROPOFOL;  Surgeon: Kathi Der, MD;  Location: [facility] ENDOSCOPY;  Service: Gastroenterology;  Laterality: N/A;  . EYE SURGERY    . ILIAC ARTERY STENT  2011   Bilateral CIA stent  . lazer eye surgery    . RADIOLOGY WITH ANESTHESIA N/A 03/20/2016   Procedure: MRI OF BRAIN;  Surgeon: Medication Radiologist, MD;  Location: [facility] OR;  Service: Radiology;  Laterality: N/A;  . Right Shoulder Surgery  2010  . TUBAL LIGATION      OB History    Gravida Para Term Preterm AB Living   0 0 0 0 0     SAB TAB Ectopic Multiple Live Births   0 0 0           Home Medications    Prior to Admission medications   Medication Sig Start Date End Date Taking? Authorizing Provider  aspirin 81 MG EC tablet 1 TABLET DAILY WITH FOOD 06/17/16  Yes Corwin Levins, MD  atorvastatin (LIPITOR) 10 MG tablet 1/2 tab by mouth daily 06/17/16  Yes Corwin Levins, MD  diltiazem (CARDIZEM CD) 180 MG 24 hr capsule Take 1 capsule (180 mg total) by mouth daily. 05/18/16  Yes Bonna Gains Nche, NP  donepezil (ARICEPT) 5 MG tablet Take 1 tablet (5 mg total) by mouth at bedtime. 03/22/16  Yes Costin Otelia Sergeant, MD  fenofibrate micronized (LOFIBRA) 134 MG capsule TAKE 1 CAPSULE  (134 MG TOTAL) BY MOUTH DAILY. 05/18/16  Yes Anne Ng, NP  ferrous sulfate 325 (65 FE) MG tablet 1 twice a day 05/18/16  Yes Bonna Gains Nche, NP  hydrochlorothiazide (MICROZIDE) 12.5 MG capsule Take 1 capsule (12.5 mg total) by mouth daily. 06/01/16  Yes Corwin Levins, MD  meclizine (ANTIVERT) 12.5 MG tablet Take 1 tablet (12.5 mg total) by mouth 2 (two) times daily as needed for dizziness (do not use for more than 3days). 05/18/16  Yes Bonna Gains Nche, NP  metoprolol tartrate (LOPRESSOR) 25 MG tablet Take 0.5 tablets (12.5 mg total) by mouth 2 (two) times daily. 05/18/16  Yes Anne Ng, NP  Multiple Vitamin (MULTIVITAMIN WITH MINERALS) TABS tablet Take 1 tablet by mouth daily.   Yes Historical Provider, MD  Multiple Vitamins-Minerals (PRESERVISION AREDS 2) CAPS Take 1 capsule  by mouth 2 (two) times daily.   Yes Historical Provider, MD  OLANZapine (ZYPREXA) 2.5 MG tablet Take 2.5 mg by mouth daily. 04/10/16  Yes Historical Provider, MD  pantoprazole (PROTONIX) 40 MG tablet Take 1 tablet (40 mg total) by mouth daily. 06/16/16  Yes Corwin Levins, MD  pentoxifylline (TRENTAL) 400 MG CR tablet TAKE 1 TABLET (400 MG TOTAL) BY MOUTH DAILY. 05/18/16  Yes Bonna Gains Nche, NP  sennosides-docusate sodium (SENOKOT-S) 8.6-50 MG tablet Take 1 tablet by mouth daily. 06/16/16  Yes Corwin Levins, MD  traMADol (ULTRAM) 50 MG tablet Take 1 tablet (50 mg total) by mouth every 8 (eight) hours as needed. 06/16/16  Yes Corwin Levins, MD    Family History Family History  Problem Relation Age of Onset  . Colon cancer Mother   . Diabetes Sister     Amputation    Social History Social History  Substance Use Topics  . Smoking status: Former Games developer  . Smokeless tobacco: Never Used     Comment: QUIT IN 74  . Alcohol use No     Allergies   Patient has no known allergies.   Review of Systems Review of Systems  Constitutional: Positive for appetite change and chills. Negative for fever.  HENT:  Negative for rhinorrhea, sinus pain, sneezing and sore throat.   Eyes: Negative for visual disturbance.  Respiratory: Positive for chest tightness. Negative for cough, shortness of breath and wheezing.   Cardiovascular: Negative for chest pain, palpitations and leg swelling.  Gastrointestinal: Positive for abdominal pain, nausea and vomiting (vomiting dark brown contents). Negative for constipation and diarrhea.  Genitourinary: Negative for decreased urine volume, difficulty urinating and hematuria.  Musculoskeletal: Negative for back pain and myalgias.  Skin: Negative for rash.  Neurological: Negative for light-headedness and headaches.     Physical Exam Updated Vital Signs BP (!) 104/58 (BP Location: Right Arm)   Pulse (!) 116   Temp 98 F (36.7 C) (Oral)   Resp 20   Ht 4\' 11"  (1.499 m)   Wt 41.7 kg   SpO2 96%   BMI 18.58 kg/m   Physical Exam  Constitutional: She appears well-developed and well-nourished. No distress.  HENT:  Head: Normocephalic and atraumatic.  Nose: Nose normal.  Eyes: Conjunctivae and EOM are normal. Right eye exhibits no discharge. Left eye exhibits no discharge. No scleral icterus.  Neck: Normal range of motion. Neck supple.  Cardiovascular: Regular rhythm, normal heart sounds and intact distal pulses.  Bradycardia present.  Exam reveals no gallop and no friction rub.   No murmur heard. Pulmonary/Chest: Effort normal and breath sounds normal. No respiratory distress.  Abdominal: Soft. Bowel sounds are normal. She exhibits no distension. There is tenderness in the right lower quadrant, epigastric area and suprapubic area. There is no rebound and no guarding.  Musculoskeletal: Normal range of motion. She exhibits no edema.  Neurological: She is alert. No sensory deficit. She exhibits normal muscle tone.  Skin: Skin is warm and dry. No rash noted.  Psychiatric: She has a normal mood and affect.  Nursing note and vitals reviewed.    ED Treatments /  Results  Labs (all labs ordered are listed, but only abnormal results are displayed) Labs Reviewed  LIPASE, BLOOD - Abnormal; Notable for the following:       Result Value   Lipase <10 (*)    All other components within normal limits  COMPREHENSIVE METABOLIC PANEL - Abnormal; Notable for the following:  Potassium 3.2 (*)    BUN 21 (*)    Creatinine, Ser 1.98 (*)    Calcium 8.3 (*)    Total Protein 5.5 (*)    Albumin 1.9 (*)    AST 57 (*)    Total Bilirubin 2.5 (*)    GFR calc non Af Amer 22 (*)    GFR calc Af Amer 25 (*)    All other components within normal limits  CBC - Abnormal; Notable for the following:    WBC 20.0 (*)    RBC 3.45 (*)    Hemoglobin 9.5 (*)    HCT 28.9 (*)    RDW 20.5 (*)    Platelets 593 (*)    All other components within normal limits  URINALYSIS, ROUTINE W REFLEX MICROSCOPIC - Abnormal; Notable for the following:    Color, Urine AMBER (*)    Protein, ur 30 (*)    Bacteria, UA RARE (*)    Squamous Epithelial / LPF 0-5 (*)    Non Squamous Epithelial 0-5 (*)    All other components within normal limits  LACTIC ACID, PLASMA - Abnormal; Notable for the following:    Lactic Acid, Venous 2.1 (*)    All other components within normal limits  CBC - Abnormal; Notable for the following:    WBC 17.2 (*)    RBC 3.09 (*)    Hemoglobin 8.5 (*)    HCT 26.0 (*)    RDW 20.5 (*)    Platelets 507 (*)    All other components within normal limits  PROTIME-INR - Abnormal; Notable for the following:    Prothrombin Time 20.5 (*)    All other components within normal limits  BASIC METABOLIC PANEL - Abnormal; Notable for the following:    Sodium 151 (*)    Potassium 2.9 (*)    Glucose, Bld 102 (*)    BUN 25 (*)    Creatinine, Ser 2.10 (*)    Calcium 7.8 (*)    GFR calc non Af Amer 20 (*)    GFR calc Af Amer 23 (*)    Anion gap 17 (*)    All other components within normal limits  CBC - Abnormal; Notable for the following:    WBC 16.1 (*)    RBC 3.15 (*)     Hemoglobin 8.9 (*)    HCT 26.6 (*)    RDW 21.2 (*)    Platelets 485 (*)    All other components within normal limits  GLUCOSE, CAPILLARY - Abnormal; Notable for the following:    Glucose-Capillary 117 (*)    All other components within normal limits  I-STAT CG4 LACTIC ACID, ED - Abnormal; Notable for the following:    Lactic Acid, Venous 2.31 (*)    All other components within normal limits  I-STAT CG4 LACTIC ACID, ED - Abnormal; Notable for the following:    Lactic Acid, Venous 2.23 (*)    All other components within normal limits  TSH  HEMOGLOBIN A1C    EKG  EKG Interpretation None       Radiology Ct Abdomen Pelvis Wo Contrast  Result Date: 06/30/2016 CLINICAL DATA:  Lower abdominal pain. Decreased p.o. intake. Weakness for 2 weeks. Vomiting. No urine output. EXAM: CT ABDOMEN AND PELVIS WITHOUT CONTRAST TECHNIQUE: Multidetector CT imaging of the abdomen and pelvis was performed following the standard protocol without IV contrast. COMPARISON:  CT of the abdomen and pelvis 03/15/2016. FINDINGS: Lower chest: Extensive pleural calcifications are again noted.  Asymmetric left lower lobe airspace disease is present. Left-sided pleural thickening and a small effusion are noted. The heart is enlarged. Coronary artery calcifications are present. No significant right pleural or pericardial effusion is present. Hepatobiliary: No focal hepatic lesions are present. The gallbladder is hyperdense likely reflecting multiple stones. There is increased edema within the gallbladder wall. The common bile duct is within normal limits for age. There is extensive fluid surrounding gallbladder and liver. Pancreas: Unremarkable. No pancreatic ductal dilatation or surrounding inflammatory changes. Spleen: Splenic calcifications are present. There is free fluid along the edge of the spleen. Adrenals/Urinary Tract: Adrenal glands are normal bilaterally. Bilateral renal atrophy is again seen. No focal lesions are  present. Ureters are within normal limits. The urinary bladder is unremarkable. Stomach/Bowel: The a moderate hiatal hernia present. The stomach and duodenum are otherwise within normal limits. The small bowel is unremarkable. Diverticular changes are present within the ascending colon without inflammation centered about the colon. The transverse colon is mostly collapsed. Additional diverticular changes are present the descending and sigmoid colon without focal inflammation. Vascular/Lymphatic: Dense atherosclerotic calcifications are present in the aorta and branch vessels. There are dense calcifications which may reflect stenoses at the origins of renal arteries and SMA. Iliac artery stenting is noted. Reproductive: Within normal limits for age. Other: Free fluid extends into the anatomic pelvis. There is no free air. Musculoskeletal: Endplate degenerative changes are most evident L5-S1. No focal lytic or blastic lesion present otherwise. The pelvis is intact. The hips are unremarkable. IMPRESSION: 1. Extensive free fluid inflammatory changes about the neck of the gallbladder suggesting acute cholecystitis in the setting of known gallstones and increased gallbladder wall edema. Gallbladder ultrasound may be useful further evaluation. 2. Diffuse abdominal ascites with free fluid about the liver and spleen and extending into the anatomic pelvis. 3. Ascending and descending colonic diverticulosis without focal inflammation about the colon to suggest diverticulitis. 4. Extensive atherosclerotic disease as described. 5. Multilevel spondylosis in the lumbar spine. 6. Asymmetric left lower lobe airspace disease and effusion. Infection is not excluded. 7. Cardiomegaly without failure. 8. Moderate-sized hiatal hernia. Electronically Signed   By: Marin Roberts M.D.   On: 06/30/2016 16:44    Procedures Procedures (including critical care time)  Medications Ordered in ED Medications  iopamidol (ISOVUE-300) 61  % injection (not administered)  piperacillin-tazobactam (ZOSYN) IVPB 2.25 g (2.25 g Intravenous Given 07/01/16 1151)  donepezil (ARICEPT) tablet 5 mg (5 mg Oral Given 06/30/16 2223)  OLANZapine (ZYPREXA) tablet 2.5 mg (2.5 mg Oral Given 06/30/16 2355)  multivitamin with minerals tablet 1 tablet (1 tablet Oral Not Given 07/01/16 1000)  metoprolol tartrate (LOPRESSOR) tablet 12.5 mg (12.5 mg Oral Not Given 07/01/16 1000)  diltiazem (CARDIZEM CD) 24 hr capsule 180 mg (180 mg Oral Given 06/30/16 2223)  pentoxifylline (TRENTAL) CR tablet 400 mg (400 mg Oral Not Given 07/01/16 1130)  aspirin EC tablet 81 mg (81 mg Oral Not Given 07/01/16 1000)  heparin injection 5,000 Units (5,000 Units Subcutaneous Given 07/01/16 0610)  sodium chloride flush (NS) 0.9 % injection 3 mL (0 mLs Intravenous Duplicate 07/01/16 1000)  acetaminophen (TYLENOL) tablet 650 mg (not administered)    Or  acetaminophen (TYLENOL) suppository 650 mg (not administered)  HYDROcodone-acetaminophen (NORCO/VICODIN) 5-325 MG per tablet 1-2 tablet (1 tablet Oral Given 07/01/16 0439)  ondansetron (ZOFRAN) tablet 4 mg (not administered)    Or  ondansetron (ZOFRAN) injection 4 mg (not administered)  morphine 2 MG/ML injection 1 mg (1 mg Intravenous Given 07/01/16 0058)  multivitamin (PROSIGHT) tablet 1 tablet (1 tablet Oral Not Given 07/01/16 1000)  dextrose 5 % and 0.45% NaCl 1,000 mL with potassium chloride 30 mEq infusion ( Intravenous New Bag/Given 07/01/16 0812)  morphine 2 MG/ML injection 1.5 mg (not administered)  sodium chloride 0.9 % bolus 1,000 mL (0 mLs Intravenous Stopped 06/30/16 1852)  sodium chloride 0.9 % bolus 1,000 mL (0 mLs Intravenous Stopped 06/30/16 1923)  piperacillin-tazobactam (ZOSYN) IVPB 3.375 g (0 g Intravenous Stopped 06/30/16 1923)  metoprolol (LOPRESSOR) injection 5 mg (5 mg Intravenous Given 06/30/16 2012)  sodium chloride 0.9 % bolus 500 mL (500 mLs Intravenous Transfusing/Transfer 06/30/16 2030)  LORazepam (ATIVAN)  injection 0.5 mg (0.5 mg Intravenous Given 07/01/16 0442)  digoxin (LANOXIN) 0.25 MG/ML injection 0.25 mg (0.25 mg Intravenous Given 07/01/16 0624)  morphine 4 MG/ML injection (1.5 mg  Given 07/01/16 1604)     Initial Impression / Assessment and Plan / ED Course  I have reviewed the triage vital signs and the nursing notes.  Pertinent labs & imaging results that were available during my care of the patient were reviewed by me and considered in my medical decision making (see chart for details).     Patient's history and symptoms were concerning for possible GI bleed vs. UTI vs. Diverticulitis vs. Kidney disease. Lactic acid was elevated at 2.31. CBC showed leukocytosis at 20.0 and decrease in H/H to 9.5/28.9.  BUN and Cr were increased to 21 and 1.98. Lipase was normal. CT abd/pelvis wo contrast was ordered. This was pending at end of shift.  Final Clinical Impressions(s) / ED Diagnoses   Final diagnoses:  Diffuse abdominal pain  AKI (acute kidney injury) (HCC)  Leukocytosis, unspecified type    New Prescriptions Current Discharge Medication List       [place], [place] 07/01/16 1646    Cathren Laine, MD 07/01/16 [phone]

## 2016-06-30 NOTE — Progress Notes (Signed)
Pharmacy Antibiotic Note  Horton FinerOphelia W Zetino is a 83 y.o. female admitted on 06/30/2016 with intra-abdominal infxn.  Pharmacy has been consulted for zosyn dosing. Pt is afebrile but WBC is elevated at 20. SCr is elevated at 1.98 and lactic acid is elevated at 2.23.   Plan: Zosyn 3.375gm IV x 1 per MD then 2.25gm IV Q8H F/u renal fxn, C&S, clinical status      Temp (24hrs), Avg:98.6 F (37 C), Min:98.6 F (37 C), Max:98.6 F (37 C)   Recent Labs Lab 06/30/16 1210 06/30/16 1221 06/30/16 1541  WBC 20.0*  --   --   CREATININE 1.98*  --   --   LATICACIDVEN  --  2.31* 2.23*    CrCl cannot be calculated (Unknown ideal weight.).    No Known Allergies  Antimicrobials this admission: Zosyn 3/28>>  Dose adjustments this admission: N/A  Microbiology results: Pending  Thank you for allowing pharmacy to be a part of this patient's care.  Jeweliana Dudgeon, Drake LeachRachel Lynn 06/30/2016 5:17 PM

## 2016-06-30 NOTE — H&P (Signed)
History and Physical    Tracy [name]:[id number] DOB: 01-11-1927 DOA: 06/30/2016  PCP: Oliver Barre, MD  Patient coming from: Home  Chief Complaint: Pain and nausea/vomiting  HPI: Tracy [name] is a 83 y.o. female with medical history significant of dementia, history of CVA and chronic atrial fibrillation, not anticoagulated secondary to previous GI bleed. Patient came into the hospital because of abdominal pain, nausea and vomiting. All of the history obtained from daughter at bedside, patient started to have some abdominal pain and nausea about a week ago, started to have vomiting this morning and worsening abdominal pain so she came into the hospital for further evaluation. CT scan was done and showed acute cholecystitis, general surgery was called initially, recommended internal medicine admission secondary to comorbid conditions including a fib, and probably patient will not be a good surgical candidate.  ED Course:  Vitals: She is to Neck and tachycardic Labs: Significant leukocytosis, acute renal failure Imaging: CT scan showed acute cholecystitis Interventions: Started on Zosyn  Review of Systems:  Review of systems is unobtainable secondary to dementia and confusion.  Past Medical History:  Diagnosis Date  . Atrial fibrillation (HCC)   . CAROTID STENOSIS   . CVA (cerebrovascular accident) (HCC) 2002  . DIABETES MELLITUS, TYPE II   . DIVERTICULOSIS, COLON   . DM (diabetes mellitus), type 2 (HCC)   . DVT (deep venous thrombosis) (HCC)   . Gastric ulcer   . GERD   . HTN (hypertension)   . Hx of adenomatous colonic polyps 2007  . Hyperlipidemia   . Hypertension   . OSTEOPENIA   . PVD (peripheral vascular disease) (HCC)    recently dx w/ carotid -left  int carotid >90% 50-60% rt int carotid 02/2010  . PVD (peripheral vascular disease) (HCC)    lf  ABI 0.11 and rt 0.43  . Sinus node dysfunction (HCC) 2011  . Stroke [facility]) July 01, 2014   Affected Left side  .  Upper GI bleed 02/2016    Past Surgical History:  Procedure Laterality Date  . CAROTID ENDARTERECTOMY    . cataract eye surgery    . CNS vascular stent per Neurosurgeon    . ESOPHAGOGASTRODUODENOSCOPY (EGD) WITH PROPOFOL N/A 02/24/2016   Procedure: ESOPHAGOGASTRODUODENOSCOPY (EGD) WITH PROPOFOL;  Surgeon: Kathi Der, MD;  Location: [facility] ENDOSCOPY;  Service: Gastroenterology;  Laterality: N/A;  . EYE SURGERY    . ILIAC ARTERY STENT  2011   Bilateral CIA stent  . lazer eye surgery    . RADIOLOGY WITH ANESTHESIA N/A 03/20/2016   Procedure: MRI OF BRAIN;  Surgeon: Medication Radiologist, MD;  Location: [facility] OR;  Service: Radiology;  Laterality: N/A;  . Right Shoulder Surgery  2010  . TUBAL LIGATION       reports that she has quit smoking. She has never used smokeless tobacco. She reports that she does not drink alcohol or use drugs.  No Known Allergies  Family History  Problem Relation Age of Onset  . Colon cancer Mother   . Diabetes Sister     Amputation    Prior to Admission medications   Medication Sig Start Date End Date Taking? Authorizing Provider  aspirin 81 MG EC tablet 1 TABLET DAILY WITH FOOD 06/17/16  Yes Corwin Levins, MD  atorvastatin (LIPITOR) 10 MG tablet 1/2 tab by mouth daily 06/17/16  Yes Corwin Levins, MD  diltiazem (CARDIZEM CD) 180 MG 24 hr capsule Take 1 capsule (180 mg total) by mouth daily. 05/18/16  Yes Bonna Gainsharlotte Lum Nche, NP  donepezil (ARICEPT) 5 MG tablet Take 1 tablet (5 mg total) by mouth at bedtime. 03/22/16  Yes Costin Otelia SergeantM Gherghe, MD  fenofibrate micronized (LOFIBRA) 134 MG capsule TAKE 1 CAPSULE (134 MG TOTAL) BY MOUTH DAILY. 05/18/16  Yes Anne Ngharlotte Lum Nche, NP  ferrous sulfate 325 (65 FE) MG tablet 1 twice a day 05/18/16  Yes Bonna Gainsharlotte Lum Nche, NP  hydrochlorothiazide (MICROZIDE) 12.5 MG capsule Take 1 capsule (12.5 mg total) by mouth daily. 06/01/16  Yes Corwin LevinsJames W John, MD  meclizine (ANTIVERT) 12.5 MG tablet Take 1 tablet (12.5 mg total) by mouth 2  (two) times daily as needed for dizziness (do not use for more than 3days). 05/18/16  Yes Bonna Gainsharlotte Lum Nche, NP  metoprolol tartrate (LOPRESSOR) 25 MG tablet Take 0.5 tablets (12.5 mg total) by mouth 2 (two) times daily. 05/18/16  Yes Anne Ngharlotte Lum Nche, NP  Multiple Vitamin (MULTIVITAMIN WITH MINERALS) TABS tablet Take 1 tablet by mouth daily.   Yes Historical Provider, MD  Multiple Vitamins-Minerals (PRESERVISION AREDS 2) CAPS Take 1 capsule by mouth 2 (two) times daily.   Yes Historical Provider, MD  OLANZapine (ZYPREXA) 2.5 MG tablet Take 2.5 mg by mouth daily. 04/10/16  Yes Historical Provider, MD  pantoprazole (PROTONIX) 40 MG tablet Take 1 tablet (40 mg total) by mouth daily. 06/16/16  Yes Corwin LevinsJames W John, MD  pentoxifylline (TRENTAL) 400 MG CR tablet TAKE 1 TABLET (400 MG TOTAL) BY MOUTH DAILY. 05/18/16  Yes Bonna Gainsharlotte Lum Nche, NP  sennosides-docusate sodium (SENOKOT-S) 8.6-50 MG tablet Take 1 tablet by mouth daily. 06/16/16  Yes Corwin LevinsJames W John, MD  traMADol (ULTRAM) 50 MG tablet Take 1 tablet (50 mg total) by mouth every 8 (eight) hours as needed. 06/16/16  Yes Corwin LevinsJames W John, MD    Physical Exam:  Vitals:   06/30/16 1415 06/30/16 1430 06/30/16 1445 06/30/16 1530  BP: 129/61 121/84 125/87 122/87  Pulse: (!) 115 71    Resp:    (!) 30  Temp:      TempSrc:      SpO2: 99% 100%      Constitutional: No acute distress, patient is confused, Eyes: PERRL, lids and conjunctivae normal ENMT: Mucous membranes are moist. Posterior pharynx clear of any exudate or lesions.Normal dentition.  Neck: normal, supple, no masses, no thyromegaly Respiratory: clear to auscultation bilaterally, no wheezing, no crackles. Normal respiratory effort. No accessory muscle use.  Cardiovascular: Regular rate and rhythm, no murmurs / rubs / gallops. No extremity edema. 2+ pedal pulses. No carotid bruits.  Abdomen: no tenderness, no masses palpated. No hepatosplenomegaly. Bowel sounds positive.  Musculoskeletal: no clubbing /  cyanosis. No joint deformity upper and lower extremities. Good ROM, no contractures. Normal muscle tone.  Skin: no rashes, lesions, ulcers. No induration Neurologic: Not tested because of confusion. Psychiatric: Not tested because of confusion.  Labs on Admission: I have personally reviewed following labs and imaging studies  CBC:  Recent Labs Lab 06/30/16 1210  WBC 20.0*  HGB 9.5*  HCT 28.9*  MCV 83.8  PLT 593*   Basic Metabolic Panel:  Recent Labs Lab 06/30/16 1210  NA 144  K 3.2*  CL 102  CO2 27  GLUCOSE 66  BUN 21*  CREATININE 1.98*  CALCIUM 8.3*   GFR: CrCl cannot be calculated (Unknown ideal weight.). Liver Function Tests:  Recent Labs Lab 06/30/16 1210  AST 57*  ALT 24  ALKPHOS 70  BILITOT 2.5*  PROT 5.5*  ALBUMIN 1.9*  Recent Labs Lab 06/30/16 1210  LIPASE <10*   No results for input(s): AMMONIA in the last 168 hours. Coagulation Profile: No results for input(s): INR, PROTIME in the last 168 hours. Cardiac Enzymes: No results for input(s): CKTOTAL, CKMB, CKMBINDEX, TROPONINI in the last 168 hours. BNP (last 3 results) No results for input(s): PROBNP in the last 8760 hours. HbA1C: No results for input(s): HGBA1C in the last 72 hours. CBG: No results for input(s): GLUCAP in the last 168 hours. Lipid Profile: No results for input(s): CHOL, HDL, LDLCALC, TRIG, CHOLHDL, LDLDIRECT in the last 72 hours. Thyroid Function Tests: No results for input(s): TSH, T4TOTAL, FREET4, T3FREE, THYROIDAB in the last 72 hours. Anemia Panel: No results for input(s): VITAMINB12, FOLATE, FERRITIN, TIBC, IRON, RETICCTPCT in the last 72 hours. Urine analysis:    Component Value Date/Time   COLORURINE AMBER (A) 06/30/2016 1638   APPEARANCEUR CLEAR 06/30/2016 1638   LABSPEC 1.020 06/30/2016 1638   PHURINE 5.0 06/30/2016 1638   GLUCOSEU NEGATIVE 06/30/2016 1638   GLUCOSEU NEGATIVE 05/19/2016 0959   HGBUR NEGATIVE 06/30/2016 1638   BILIRUBINUR NEGATIVE  06/30/2016 1638   KETONESUR NEGATIVE 06/30/2016 1638   PROTEINUR 30 (A) 06/30/2016 1638   UROBILINOGEN 2.0 (A) 05/19/2016 0959   NITRITE NEGATIVE 06/30/2016 1638   LEUKOCYTESUR NEGATIVE 06/30/2016 1638   Sepsis Labs: !!!!!!!!!!!!!!!!!!!!!!!!!!!!!!!!!!!!!!!!!!!! Invalid input(s): PROCALCITONIN, LACTICIDVEN No results found for this or any previous visit (from the past 240 hour(s)).   Radiological Exams on Admission: Ct Abdomen Pelvis Wo Contrast  Result Date: 06/30/2016 CLINICAL DATA:  Lower abdominal pain. Decreased p.o. intake. Weakness for 2 weeks. Vomiting. No urine output. EXAM: CT ABDOMEN AND PELVIS WITHOUT CONTRAST TECHNIQUE: Multidetector CT imaging of the abdomen and pelvis was performed following the standard protocol without IV contrast. COMPARISON:  CT of the abdomen and pelvis 03/15/2016. FINDINGS: Lower chest: Extensive pleural calcifications are again noted. Asymmetric left lower lobe airspace disease is present. Left-sided pleural thickening and a small effusion are noted. The heart is enlarged. Coronary artery calcifications are present. No significant right pleural or pericardial effusion is present. Hepatobiliary: No focal hepatic lesions are present. The gallbladder is hyperdense likely reflecting multiple stones. There is increased edema within the gallbladder wall. The common bile duct is within normal limits for age. There is extensive fluid surrounding gallbladder and liver. Pancreas: Unremarkable. No pancreatic ductal dilatation or surrounding inflammatory changes. Spleen: Splenic calcifications are present. There is free fluid along the edge of the spleen. Adrenals/Urinary Tract: Adrenal glands are normal bilaterally. Bilateral renal atrophy is again seen. No focal lesions are present. Ureters are within normal limits. The urinary bladder is unremarkable. Stomach/Bowel: The a moderate hiatal hernia present. The stomach and duodenum are otherwise within normal limits. The small  bowel is unremarkable. Diverticular changes are present within the ascending colon without inflammation centered about the colon. The transverse colon is mostly collapsed. Additional diverticular changes are present the descending and sigmoid colon without focal inflammation. Vascular/Lymphatic: Dense atherosclerotic calcifications are present in the aorta and branch vessels. There are dense calcifications which may reflect stenoses at the origins of renal arteries and SMA. Iliac artery stenting is noted. Reproductive: Within normal limits for age. Other: Free fluid extends into the anatomic pelvis. There is no free air. Musculoskeletal: Endplate degenerative changes are most evident L5-S1. No focal lytic or blastic lesion present otherwise. The pelvis is intact. The hips are unremarkable. IMPRESSION: 1. Extensive free fluid inflammatory changes about the neck of the gallbladder suggesting acute cholecystitis in  the setting of known gallstones and increased gallbladder wall edema. Gallbladder ultrasound may be useful further evaluation. 2. Diffuse abdominal ascites with free fluid about the liver and spleen and extending into the anatomic pelvis. 3. Ascending and descending colonic diverticulosis without focal inflammation about the colon to suggest diverticulitis. 4. Extensive atherosclerotic disease as described. 5. Multilevel spondylosis in the lumbar spine. 6. Asymmetric left lower lobe airspace disease and effusion. Infection is not excluded. 7. Cardiomegaly without failure. 8. Moderate-sized hiatal hernia. Electronically Signed   By: Marin Roberts M.D.   On: 06/30/2016 16:44    EKG: Independently reviewed.   Assessment/Plan Principal Problem:   Sepsis (HCC) Active Problems:   Atrial fibrillation with RVR (HCC)   AKI (acute kidney injury) (HCC)   Abdominal pain   Acute cholecystitis    Sepsis -Patient presented to the hospital with HR of 150, WBC of 20 and presence of infection (acute  cholecystitis). -There is evidence of hypoperfusion with elevated lactic acid of 2.3. -IV antibiotics started and patient given IV fluids in the ED.  Acute cholecystitis -Presented with nausea/vomiting and abdominal pain, CT scan showed acute cholecystitis. -Gen. surgery consulted, recommended nonsurgical approach as patient has multiple comorbid conditions. -Started on Zosyn, continued. Reevaluate in a.m. she might need percutaneous cholecystostomy.  AKI -Baseline creatinine is 0.5 from 2/13, presented with creatinine of 1.98. -This is likely secondary to dehydration/volume depletion from sepsis, started on IV fluids. -Check BMP in a.m.  Hypokalemia -Potassium at 3.2, replete with oral supplements, check BMP in a.m.  Atrial fibrillation with RVR -Reportedly patient vomited this morning including her medications, restarted Cardizem and Toprol-XL. -CHA2DS2-VASc is likely greater than 6, female, HTN, 2+ CVA and 2+ age. -She is on low-dose aspirin, anticoagulation is contraindicated secondary to recent significant GI bleed.  Dementia without behavioral disturbances -She is on Zyprexa at night, restarted. -Discussed with daughter at bedside she will likely to develop sundowning and acute delirium at night.   DVT prophylaxis: SQ Heparin Code Status: Prior DO NOT RESUSCITATE, verified with her daughter at bedside. Family Communication: Plan D/W patient Disposition Plan: Home Consults called:  Admission status: Inpatient, telemetry   [facility] A MD [facility] Pager [phone]  If 7PM-7AM, please contact night-coverage [email or web address] Password [email or web address]  06/30/2016, 5:49 PM

## 2016-07-01 ENCOUNTER — Inpatient Hospital Stay (HOSPITAL_COMMUNITY): Payer: Medicare Other

## 2016-07-01 DIAGNOSIS — K819 Cholecystitis, unspecified: Secondary | ICD-10-CM

## 2016-07-01 DIAGNOSIS — E43 Unspecified severe protein-calorie malnutrition: Secondary | ICD-10-CM | POA: Diagnosis present

## 2016-07-01 LAB — BASIC METABOLIC PANEL
ANION GAP: 17 — AB (ref 5–15)
BUN: 25 mg/dL — ABNORMAL HIGH (ref 6–20)
CALCIUM: 7.8 mg/dL — AB (ref 8.9–10.3)
CO2: 24 mmol/L (ref 22–32)
CREATININE: 2.1 mg/dL — AB (ref 0.44–1.00)
Chloride: 110 mmol/L (ref 101–111)
GFR, EST AFRICAN AMERICAN: 23 mL/min — AB (ref >60)
GFR, EST NON AFRICAN AMERICAN: 20 mL/min — AB (ref >60)
Glucose, Bld: 102 mg/dL — ABNORMAL HIGH (ref 65–99)
Potassium: 2.9 mmol/L — ABNORMAL LOW (ref 3.5–5.1)
SODIUM: 151 mmol/L — AB (ref 135–145)

## 2016-07-01 LAB — CBC
HCT: 26.6 % — ABNORMAL LOW (ref 36.0–46.0)
HEMOGLOBIN: 8.9 g/dL — AB (ref 12.0–15.0)
MCH: 28.3 pg (ref 26.0–34.0)
MCHC: 33.5 g/dL (ref 30.0–36.0)
MCV: 84.4 fL (ref 78.0–100.0)
PLATELETS: 485 10*3/uL — AB (ref 150–400)
RBC: 3.15 MIL/uL — AB (ref 3.87–5.11)
RDW: 21.2 % — ABNORMAL HIGH (ref 11.5–15.5)
WBC: 16.1 10*3/uL — AB (ref 4.0–10.5)

## 2016-07-01 LAB — GLUCOSE, CAPILLARY: GLUCOSE-CAPILLARY: 117 mg/dL — AB (ref 65–99)

## 2016-07-01 MED ORDER — MORPHINE SULFATE (PF) 4 MG/ML IV SOLN
INTRAVENOUS | Status: AC
  Administered 2016-07-01: 1.5 mg
  Filled 2016-07-01: qty 1

## 2016-07-01 MED ORDER — POTASSIUM CHLORIDE 2 MEQ/ML IV SOLN
INTRAVENOUS | Status: DC
  Administered 2016-07-01 (×2): via INTRAVENOUS
  Filled 2016-07-01 (×4): qty 1000

## 2016-07-01 MED ORDER — DIGOXIN 0.25 MG/ML IJ SOLN
0.2500 mg | Freq: Once | INTRAMUSCULAR | Status: AC
  Administered 2016-07-01: 0.25 mg via INTRAVENOUS
  Filled 2016-07-01: qty 2

## 2016-07-01 MED ORDER — SODIUM CHLORIDE 0.9 % IV SOLN
30.0000 meq | INTRAVENOUS | Status: DC
  Administered 2016-07-01: 30 meq via INTRAVENOUS
  Filled 2016-07-01 (×2): qty 15

## 2016-07-01 MED ORDER — MORPHINE SULFATE (PF) 2 MG/ML IV SOLN: 1.5000 mg | Freq: Once | INTRAVENOUS | Status: DC

## 2016-07-01 MED ORDER — LORAZEPAM 2 MG/ML IJ SOLN
0.5000 mg | Freq: Once | INTRAMUSCULAR | Status: AC
  Administered 2016-07-01: 0.5 mg via INTRAVENOUS
  Filled 2016-07-01: qty 1

## 2016-07-01 MED ORDER — TECHNETIUM TC 99M MEBROFENIN IV KIT
5.0000 | PACK | Freq: Once | INTRAVENOUS | Status: AC | PRN
  Administered 2016-07-01: 5 via INTRAVENOUS

## 2016-07-01 NOTE — Progress Notes (Signed)
PROGRESS NOTE  Tracy [name]  ZOX:[id number] DOB: Aug 08, 1926 DOA: 06/30/2016 PCP: Oliver Barre, MD Outpatient Specialists:  Subjective: She is confused this morning,, HIDA scan scheduled for later today.  Brief Narrative:  Tracy [name] is a 83 y.o. female with medical history significant of dementia, history of CVA and chronic atrial fibrillation, not anticoagulated secondary to previous GI bleed. Patient came into the hospital because of abdominal pain, nausea and vomiting. All of the history obtained from daughter at bedside, patient started to have some abdominal pain and nausea about a week ago, started to have vomiting this morning and worsening abdominal pain so she came into the hospital for further evaluation. CT scan was done and showed acute cholecystitis, general surgery was called initially, recommended internal medicine admission secondary to comorbid conditions including a fib, and probably patient will not be a good surgical candidate.  Assessment & Plan:   Principal Problem:   Sepsis (HCC) Active Problems:   Atrial fibrillation with RVR (HCC)   AKI (acute kidney injury) (HCC)   Abdominal pain   Acute cholecystitis   Protein-calorie malnutrition, severe   Sepsis -Patient presented to the hospital with HR of 150, WBC of 20 and presence of infection (acute cholecystitis). -There is evidence of hypoperfusion with elevated lactic acid of 2.3. -IV antibiotics started and patient given IV fluids in the ED.  Acute cholecystitis -Presented with nausea/vomiting and abdominal pain, CT scan showed acute cholecystitis. -Gen. surgery consulted, recommended nonsurgical approach as patient has multiple comorbid conditions. -Continue Zosyn, general surgery ordering HIDA scan based on findings we will decide for percutaneous cholecystostomy.  AKI -Baseline creatinine is 0.5 from 2/13, presented with creatinine of 1.98. -Creatinine worsened to 2.1 this morning, continue IV  fluids, check BMP in a.m.  Hypokalemia -Potassium at 3.2, ordered oral supplements. -Not sure if he received her oral supplements, potassium is 2.9 this morning, switched to IV supplements check BMP in a.m.  Atrial fibrillation with RVR -Reportedly patient vomited this morning including her medications, restarted Cardizem and Toprol-XL. -CHA2DS2-VASc is likely greater than 6, female, HTN, 2+ CVA and 2+ age. -She is on low-dose aspirin, anticoagulation is contraindicated secondary to recent significant GI bleed.  Dementia without behavioral disturbances -She is on Zyprexa at night, restarted. -Discussed with daughter at bedside she will likely to develop sundowning and acute delirium at night.   DVT prophylaxis:  Code Status: DNR Family Communication:  Disposition Plan:  Diet: Diet NPO time specified  Consultants:   Gen. surgery  Procedures:   None  Antimicrobials:   Zosyn   Objective: Vitals:   06/30/16 2028 06/30/16 2058 07/01/16 0445 07/01/16 1114  BP:  132/73 (!) 104/58   Pulse: 66 (!) 116 (!) 116   Resp: (!) 24 20 20    Temp:  98.3 F (36.8 C) 98 F (36.7 C)   TempSrc:  Axillary Oral   SpO2: 98% 98% 96%   Weight:  41.9 kg (92 lb 6.4 oz)  41.7 kg (92 lb)  Height:    4\' 11"  (1.499 m)    Intake/Output Summary (Last 24 hours) at 07/01/16 1126 Last data filed at 07/01/16 0300  Gross per 24 hour  Intake           688.25 ml  Output                0 ml  Net           688.25 ml   Filed Weights   06/30/16 2058 07/01/16  1114  Weight: 41.9 kg (92 lb 6.4 oz) 41.7 kg (92 lb)    Examination: General exam: Appears calm and comfortable  Respiratory system: Clear to auscultation. Respiratory effort normal. Cardiovascular system: S1 & S2 heard, RRR. No JVD, murmurs, rubs, gallops or clicks. No pedal edema. Gastrointestinal system: Abdomen is nondistended, soft and nontender. No organomegaly or masses felt. Normal bowel sounds heard. Central nervous system: Alert  and oriented. No focal neurological deficits. Extremities: Symmetric 5 x 5 power. Skin: No rashes, lesions or ulcers Psychiatry: Judgement and insight appear normal. Mood & affect appropriate.   Data Reviewed: I have personally reviewed following labs and imaging studies  CBC:  Recent Labs Lab 06/30/16 1210 06/30/16 2207 07/01/16 0354  WBC 20.0* 17.2* 16.1*  HGB 9.5* 8.5* 8.9*  HCT 28.9* 26.0* 26.6*  MCV 83.8 84.1 84.4  PLT 593* 507* 485*   Basic Metabolic Panel:  Recent Labs Lab 06/30/16 1210 07/01/16 0354  NA 144 151*  K 3.2* 2.9*  CL 102 110  CO2 27 24  GLUCOSE 66 102*  BUN 21* 25*  CREATININE 1.98* 2.10*  CALCIUM 8.3* 7.8*   GFR: Estimated Creatinine Clearance: 12.4 mL/min (A) (by C-G formula based on SCr of 2.1 mg/dL (H)). Liver Function Tests:  Recent Labs Lab 06/30/16 1210  AST 57*  ALT 24  ALKPHOS 70  BILITOT 2.5*  PROT 5.5*  ALBUMIN 1.9*    Recent Labs Lab 06/30/16 1210  LIPASE <10*   No results for input(s): AMMONIA in the last 168 hours. Coagulation Profile:  Recent Labs Lab 06/30/16 2207  INR 1.73   Cardiac Enzymes: No results for input(s): CKTOTAL, CKMB, CKMBINDEX, TROPONINI in the last 168 hours. BNP (last 3 results) No results for input(s): PROBNP in the last 8760 hours. HbA1C: No results for input(s): HGBA1C in the last 72 hours. CBG:  Recent Labs Lab 07/01/16 0309  GLUCAP 117*   Lipid Profile: No results for input(s): CHOL, HDL, LDLCALC, TRIG, CHOLHDL, LDLDIRECT in the last 72 hours. Thyroid Function Tests:  Recent Labs  06/30/16 2207  TSH 2.401   Anemia Panel: No results for input(s): VITAMINB12, FOLATE, FERRITIN, TIBC, IRON, RETICCTPCT in the last 72 hours. Urine analysis:    Component Value Date/Time   COLORURINE AMBER (A) 06/30/2016 1638   APPEARANCEUR CLEAR 06/30/2016 1638   LABSPEC 1.020 06/30/2016 1638   PHURINE 5.0 06/30/2016 1638   GLUCOSEU NEGATIVE 06/30/2016 1638   GLUCOSEU NEGATIVE 05/19/2016  0959   HGBUR NEGATIVE 06/30/2016 1638   BILIRUBINUR NEGATIVE 06/30/2016 1638   KETONESUR NEGATIVE 06/30/2016 1638   PROTEINUR 30 (A) 06/30/2016 1638   UROBILINOGEN 2.0 (A) 05/19/2016 0959   NITRITE NEGATIVE 06/30/2016 1638   LEUKOCYTESUR NEGATIVE 06/30/2016 1638   Sepsis Labs: @LABRCNTIP (procalcitonin:4,lacticidven:4)  )No results found for this or any previous visit (from the past 240 hour(s)).   Invalid input(s): PROCALCITONIN, LACTICACIDVEN   Radiology Studies: Ct Abdomen Pelvis Wo Contrast  Result Date: 06/30/2016 CLINICAL DATA:  Lower abdominal pain. Decreased p.o. intake. Weakness for 2 weeks. Vomiting. No urine output. EXAM: CT ABDOMEN AND PELVIS WITHOUT CONTRAST TECHNIQUE: Multidetector CT imaging of the abdomen and pelvis was performed following the standard protocol without IV contrast. COMPARISON:  CT of the abdomen and pelvis 03/15/2016. FINDINGS: Lower chest: Extensive pleural calcifications are again noted. Asymmetric left lower lobe airspace disease is present. Left-sided pleural thickening and a small effusion are noted. The heart is enlarged. Coronary artery calcifications are present. No significant right pleural or pericardial effusion is present.  Hepatobiliary: No focal hepatic lesions are present. The gallbladder is hyperdense likely reflecting multiple stones. There is increased edema within the gallbladder wall. The common bile duct is within normal limits for age. There is extensive fluid surrounding gallbladder and liver. Pancreas: Unremarkable. No pancreatic ductal dilatation or surrounding inflammatory changes. Spleen: Splenic calcifications are present. There is free fluid along the edge of the spleen. Adrenals/Urinary Tract: Adrenal glands are normal bilaterally. Bilateral renal atrophy is again seen. No focal lesions are present. Ureters are within normal limits. The urinary bladder is unremarkable. Stomach/Bowel: The a moderate hiatal hernia present. The stomach and  duodenum are otherwise within normal limits. The small bowel is unremarkable. Diverticular changes are present within the ascending colon without inflammation centered about the colon. The transverse colon is mostly collapsed. Additional diverticular changes are present the descending and sigmoid colon without focal inflammation. Vascular/Lymphatic: Dense atherosclerotic calcifications are present in the aorta and branch vessels. There are dense calcifications which may reflect stenoses at the origins of renal arteries and SMA. Iliac artery stenting is noted. Reproductive: Within normal limits for age. Other: Free fluid extends into the anatomic pelvis. There is no free air. Musculoskeletal: Endplate degenerative changes are most evident L5-S1. No focal lytic or blastic lesion present otherwise. The pelvis is intact. The hips are unremarkable. IMPRESSION: 1. Extensive free fluid inflammatory changes about the neck of the gallbladder suggesting acute cholecystitis in the setting of known gallstones and increased gallbladder wall edema. Gallbladder ultrasound may be useful further evaluation. 2. Diffuse abdominal ascites with free fluid about the liver and spleen and extending into the anatomic pelvis. 3. Ascending and descending colonic diverticulosis without focal inflammation about the colon to suggest diverticulitis. 4. Extensive atherosclerotic disease as described. 5. Multilevel spondylosis in the lumbar spine. 6. Asymmetric left lower lobe airspace disease and effusion. Infection is not excluded. 7. Cardiomegaly without failure. 8. Moderate-sized hiatal hernia. Electronically Signed   By: Marin Roberts M.D.   On: 06/30/2016 16:44        Scheduled Meds: . aspirin EC  81 mg Oral Daily  . diltiazem  180 mg Oral Daily  . donepezil  5 mg Oral QHS  . heparin  5,000 Units Subcutaneous Q8H  . metoprolol tartrate  12.5 mg Oral BID  . multivitamin  1 tablet Oral BID  . multivitamin with minerals  1  tablet Oral Daily  . OLANZapine  2.5 mg Oral Daily  . pentoxifylline  400 mg Oral TID WC  . piperacillin-tazobactam (ZOSYN)  IV  2.25 g Intravenous Q8H  . sodium chloride flush  3 mL Intravenous Q12H   Continuous Infusions: . dextrose 5 % and 0.45% NaCl 1,000 mL with potassium chloride 30 mEq infusion 75 mL/hr at 07/01/16 0812     LOS: 1 day    Time spent: 35 minutes    Brooklin Rieger A, MD [facility] Pager [phone]  If 7PM-7AM, please contact night-coverage [email or web address] Password [facility] 07/01/2016, 11:26 AM

## 2016-07-01 NOTE — Progress Notes (Addendum)
Initial Nutrition Assessment  DOCUMENTATION CODES:   Severe malnutrition in context of chronic illness  INTERVENTION:    Advance diet as medically appropriate, add interventions accordingly       Rec Speech Path consult for swallow evaluation    NUTRITION DIAGNOSIS:    (Severe malnutrition) related to  (CVA, dementia) as evidenced by severe depletion of muscle mass, severe depletion of body fat  GOAL:   Patient will meet greater than or equal to 90% of their needs  MONITOR:   Diet advancement, PO intake, Supplement acceptance, Labs, Weight trends, I & O's  REASON FOR ASSESSMENT:   Malnutrition Screening Tool  ASSESSMENT:   83 y.o. Female with medical history significant of dementia, history of CVA and chronic atrial fibrillation, not anticoagulated secondary to previous GI bleed. Patient came into the hospital because of abdominal pain, nausea and vomiting. All of the history obtained from daughter at bedside, patient started to have some abdominal pain and nausea about a week ago, started to have vomiting this morning and worsening abdominal pain so she came into the hospital for further evaluation.   RD spoke with p's son at bedside.  Pt confused with mitts on. Son reports pt's appetite was good PTA.  Eating 3 meals per day. Has "boxes" of nutrition supplements at home, however, pt will not drink. Labs reviewed.  Sodium 151 (H).  Potassium 2.9 (L). Medications reviewed and include MVI daily. CBG 117.  Nutrition-Focused physical exam completed. Findings are severe fat depletion, severe muscle depletion, and no edema.   Diet Order:  Diet NPO time specified  Skin:  Reviewed, no issues  Last BM:  3/29  Height:   Ht Readings from Last 1 Encounters:  07/01/16 4\' 11"  (1.499 m)   Weight:   Wt Readings from Last 1 Encounters:  07/01/16 92 lb (41.7 kg)   Ideal Body Weight:  44.6 kg  BMI:  Body mass index is 18.58 kg/m.  Estimated Nutritional Needs:   Kcal:   1200-1400  Protein:  55-65 gm  Fluid:  </= 1.5 L  EDUCATION NEEDS:   No education needs identified at this time  Maureen ChattersKatie Jakevious Hollister, RD, LDN Pager #: [phone] After-Hours Pager #: [phone]

## 2016-07-01 NOTE — Progress Notes (Signed)
Chief Complaint/ Subjective: No acute events. Still with abdominal pain  Objective: Vital signs in last 24 hours: Temp:  [98 F (36.7 C)-98.6 F (37 C)] 98 F (36.7 C) (03/29 0445) Pulse Rate:  [34-129] 116 (03/29 0445) Resp:  [14-32] 20 (03/29 0445) BP: (104-147)/(58-90) 104/58 (03/29 0445) SpO2:  [56 %-100 %] 96 % (03/29 0445) Weight:  [41.9 kg (92 lb 6.4 oz)] 41.9 kg (92 lb 6.4 oz) (03/28 2058)    Intake/Output from previous day: 03/28 0701 - 03/29 0700 In: 688.3 [P.O.:237; I.V.:401.3; IV Piggyback:50] Out: -  Intake/Output this shift: Total I/O In: 688.3 [P.O.:237; I.V.:401.3; IV Piggyback:50] Out: -   General appearance: cachectic and no distress GI: soft, nondistended, diffusely tender without guarding, no palpable mass or organomegaly  Lab Results:   Recent Labs  06/30/16 2207 07/01/16 0354  WBC 17.2* 16.1*  HGB 8.5* 8.9*  HCT 26.0* 26.6*  PLT 507* 485*   BMET  Recent Labs  06/30/16 1210 07/01/16 0354  NA 144 151*  K 3.2* 2.9*  CL 102 110  CO2 27 24  GLUCOSE 66 102*  BUN 21* 25*  CREATININE 1.98* 2.10*  CALCIUM 8.3* 7.8*   PT/INR  Recent Labs  06/30/16 2207  LABPROT 20.5*  INR 1.73   ABG No results for input(s): PHART, HCO3 in the last 72 hours.  Invalid input(s): PCO2, PO2  Studies/Results: Ct Abdomen Pelvis Wo Contrast  Result Date: 06/30/2016 CLINICAL DATA:  Lower abdominal pain. Decreased p.o. intake. Weakness for 2 weeks. Vomiting. No urine output. EXAM: CT ABDOMEN AND PELVIS WITHOUT CONTRAST TECHNIQUE: Multidetector CT imaging of the abdomen and pelvis was performed following the standard protocol without IV contrast. COMPARISON:  CT of the abdomen and pelvis 03/15/2016. FINDINGS: Lower chest: Extensive pleural calcifications are again noted. Asymmetric left lower lobe airspace disease is present. Left-sided pleural thickening and a small effusion are noted. The heart is enlarged. Coronary artery calcifications are present. No  significant right pleural or pericardial effusion is present. Hepatobiliary: No focal hepatic lesions are present. The gallbladder is hyperdense likely reflecting multiple stones. There is increased edema within the gallbladder wall. The common bile duct is within normal limits for age. There is extensive fluid surrounding gallbladder and liver. Pancreas: Unremarkable. No pancreatic ductal dilatation or surrounding inflammatory changes. Spleen: Splenic calcifications are present. There is free fluid along the edge of the spleen. Adrenals/Urinary Tract: Adrenal glands are normal bilaterally. Bilateral renal atrophy is again seen. No focal lesions are present. Ureters are within normal limits. The urinary bladder is unremarkable. Stomach/Bowel: The a moderate hiatal hernia present. The stomach and duodenum are otherwise within normal limits. The small bowel is unremarkable. Diverticular changes are present within the ascending colon without inflammation centered about the colon. The transverse colon is mostly collapsed. Additional diverticular changes are present the descending and sigmoid colon without focal inflammation. Vascular/Lymphatic: Dense atherosclerotic calcifications are present in the aorta and branch vessels. There are dense calcifications which may reflect stenoses at the origins of renal arteries and SMA. Iliac artery stenting is noted. Reproductive: Within normal limits for age. Other: Free fluid extends into the anatomic pelvis. There is no free air. Musculoskeletal: Endplate degenerative changes are most evident L5-S1. No focal lytic or blastic lesion present otherwise. The pelvis is intact. The hips are unremarkable. IMPRESSION: 1. Extensive free fluid inflammatory changes about the neck of the gallbladder suggesting acute cholecystitis in the setting of known gallstones and increased gallbladder wall edema. Gallbladder ultrasound may be useful further evaluation.  2. Diffuse abdominal ascites with  free fluid about the liver and spleen and extending into the anatomic pelvis. 3. Ascending and descending colonic diverticulosis without focal inflammation about the colon to suggest diverticulitis. 4. Extensive atherosclerotic disease as described. 5. Multilevel spondylosis in the lumbar spine. 6. Asymmetric left lower lobe airspace disease and effusion. Infection is not excluded. 7. Cardiomegaly without failure. 8. Moderate-sized hiatal hernia. Electronically Signed   By: Marin Roberts M.D.   On: 06/30/2016 16:44    Anti-infectives: Anti-infectives    Start     Dose/Rate Route Frequency Ordered Stop   07/01/16 0200  piperacillin-tazobactam (ZOSYN) IVPB 2.25 g     2.25 g 100 mL/hr over 30 Minutes Intravenous Every 8 hours 06/30/16 1714     06/30/16 1730  piperacillin-tazobactam (ZOSYN) IVPB 3.375 g     3.375 g 100 mL/hr over 30 Minutes Intravenous  Once 06/30/16 1722 06/30/16 1923   06/30/16 1715  piperacillin-tazobactam (ZOSYN) IVPB 3.375 g  Status:  Discontinued     3.375 g 100 mL/hr over 30 Minutes Intravenous  Once 06/30/16 1711 06/30/16 1721      Assessment/Plan: s/p * No surgery found * HD 2 with suspected cholecystitis. If HIDA confirms would recommend perc chole tube. She is a poor surgical candidate.  LOS: 1 day    Tracy [name] 07/01/2016

## 2016-07-02 ENCOUNTER — Encounter (HOSPITAL_COMMUNITY): Payer: Self-pay | Admitting: Interventional Radiology

## 2016-07-02 ENCOUNTER — Inpatient Hospital Stay (HOSPITAL_COMMUNITY): Payer: Medicare Other

## 2016-07-02 HISTORY — PX: IR GENERIC HISTORICAL: IMG1180011

## 2016-07-02 LAB — HEMOGLOBIN A1C
Hgb A1c MFr Bld: 4.8 % (ref 4.8–5.6)
MEAN PLASMA GLUCOSE: 91 mg/dL

## 2016-07-02 LAB — CBC
HEMATOCRIT: 28.1 % — AB (ref 36.0–46.0)
HEMOGLOBIN: 9 g/dL — AB (ref 12.0–15.0)
MCH: 27.4 pg (ref 26.0–34.0)
MCHC: 32 g/dL (ref 30.0–36.0)
MCV: 85.4 fL (ref 78.0–100.0)
Platelets: 523 10*3/uL — ABNORMAL HIGH (ref 150–400)
RBC: 3.29 MIL/uL — ABNORMAL LOW (ref 3.87–5.11)
RDW: 21.5 % — ABNORMAL HIGH (ref 11.5–15.5)
WBC: 12.8 10*3/uL — ABNORMAL HIGH (ref 4.0–10.5)

## 2016-07-02 LAB — BASIC METABOLIC PANEL
ANION GAP: 10 (ref 5–15)
BUN: 27 mg/dL — ABNORMAL HIGH (ref 6–20)
CO2: 24 mmol/L (ref 22–32)
Calcium: 7.7 mg/dL — ABNORMAL LOW (ref 8.9–10.3)
Chloride: 113 mmol/L — ABNORMAL HIGH (ref 101–111)
Creatinine, Ser: 2.22 mg/dL — ABNORMAL HIGH (ref 0.44–1.00)
GFR calc Af Amer: 22 mL/min — ABNORMAL LOW (ref >60)
GFR calc non Af Amer: 19 mL/min — ABNORMAL LOW (ref >60)
GLUCOSE: 142 mg/dL — AB (ref 65–99)
POTASSIUM: 4.6 mmol/L (ref 3.5–5.1)
Sodium: 147 mmol/L — ABNORMAL HIGH (ref 135–145)

## 2016-07-02 LAB — PROTIME-INR
INR: 1.4
PROTHROMBIN TIME: 17.3 s — AB (ref 11.4–15.2)

## 2016-07-02 MED ORDER — FENTANYL CITRATE (PF) 100 MCG/2ML IJ SOLN
INTRAMUSCULAR | Status: AC
  Filled 2016-07-02: qty 2

## 2016-07-02 MED ORDER — FENTANYL CITRATE (PF) 100 MCG/2ML IJ SOLN
INTRAMUSCULAR | Status: AC | PRN
  Administered 2016-07-02: 12.5 ug via INTRAVENOUS

## 2016-07-02 MED ORDER — HALOPERIDOL LACTATE 5 MG/ML IJ SOLN
2.0000 mg | Freq: Every evening | INTRAMUSCULAR | Status: DC | PRN
  Administered 2016-07-04: 2 mg via INTRAVENOUS
  Filled 2016-07-02: qty 1

## 2016-07-02 MED ORDER — IOPAMIDOL (ISOVUE-300) INJECTION 61%
INTRAVENOUS | Status: AC
  Administered 2016-07-02: 5 mL
  Filled 2016-07-02: qty 50

## 2016-07-02 MED ORDER — HEPARIN SODIUM (PORCINE) 5000 UNIT/ML IJ SOLN
5000.0000 [IU] | Freq: Three times a day (TID) | INTRAMUSCULAR | Status: DC
  Administered 2016-07-03 – 2016-07-12 (×27): 5000 [IU] via SUBCUTANEOUS
  Filled 2016-07-02 (×26): qty 1

## 2016-07-02 MED ORDER — LIDOCAINE HCL 1 % IJ SOLN
INTRAMUSCULAR | Status: AC | PRN
  Administered 2016-07-02: 10 mL

## 2016-07-02 MED ORDER — LIDOCAINE HCL (PF) 1 % IJ SOLN
INTRAMUSCULAR | Status: AC
  Filled 2016-07-02: qty 10

## 2016-07-02 MED ORDER — DEXTROSE-NACL 5-0.45 % IV SOLN
INTRAVENOUS | Status: DC
  Administered 2016-07-02 – 2016-07-05 (×6): via INTRAVENOUS
  Filled 2016-07-02 (×8): qty 1000

## 2016-07-02 MED ORDER — MIDAZOLAM HCL 2 MG/2ML IJ SOLN
INTRAMUSCULAR | Status: AC | PRN
  Administered 2016-07-02 (×2): 0.5 mg via INTRAVENOUS

## 2016-07-02 MED ORDER — MORPHINE SULFATE (PF) 4 MG/ML IV SOLN
INTRAVENOUS | Status: AC
  Filled 2016-07-02: qty 1

## 2016-07-02 MED ORDER — MIDAZOLAM HCL 2 MG/2ML IJ SOLN
INTRAMUSCULAR | Status: AC
  Filled 2016-07-02: qty 2

## 2016-07-02 MED ORDER — OLANZAPINE 5 MG PO TABS
5.0000 mg | ORAL_TABLET | Freq: Every day | ORAL | Status: DC
  Filled 2016-07-02: qty 1

## 2016-07-02 NOTE — Consult Note (Signed)
Chief Complaint: Patient was seen in consultation today for abdominal pain  Referring Physician(s):  Dr. Phylliss Blakes  Supervising Physician: Oley Balm  Patient Status: [facility] - In-pt  History of Present Illness: Tracy [name] is a 83 y.o. female with past medical history of dementia, a fib, DM2, diverticulosis, DVT, gastric ulcer, GERD, HTN, stroke, who presented to [facility] with acute abdominal pain, nausea, and vomiting.  CT Abd/Pelvis 06/30/16 showed extensive free fluid inflammatory changes about the neck of the gallbladder suggesting acute cholecystitis in the setting of known gallstones and increased gallbladder wall edema.  Surgery assessed patient and recommended HIDA scan which shows nonvisualization of the gallbladder suggesting cholecystitis.   IR consulted for percutaneous cholecystostomy placement at the request of Dr. Fredricka Bonine.  Per surgery note, she is a poor surgical candidate.   Patient has been NPO.  She did receive SQ heparin this AM.    Past Medical History:  Diagnosis Date  . Atrial fibrillation (HCC)   . CAROTID STENOSIS   . CVA (cerebrovascular accident) (HCC) 2002  . DIABETES MELLITUS, TYPE II   . DIVERTICULOSIS, COLON   . DM (diabetes mellitus), type 2 (HCC)   . DVT (deep venous thrombosis) (HCC)   . Gastric ulcer   . GERD   . HTN (hypertension)   . Hx of adenomatous colonic polyps 2007  . Hyperlipidemia   . Hypertension   . OSTEOPENIA   . PVD (peripheral vascular disease) (HCC)    recently dx w/ carotid -left  int carotid >90% 50-60% rt int carotid 02/2010  . PVD (peripheral vascular disease) (HCC)    lf  ABI 0.11 and rt 0.43  . Sinus node dysfunction (HCC) 2011  . Stroke [facility]) July 01, 2014   Affected Left side  . Upper GI bleed 02/2016    Past Surgical History:  Procedure Laterality Date  . CAROTID ENDARTERECTOMY    . cataract eye surgery    . CNS vascular stent per Neurosurgeon    . ESOPHAGOGASTRODUODENOSCOPY (EGD) WITH PROPOFOL  N/A 02/24/2016   Procedure: ESOPHAGOGASTRODUODENOSCOPY (EGD) WITH PROPOFOL;  Surgeon: Kathi Der, MD;  Location: [facility] ENDOSCOPY;  Service: Gastroenterology;  Laterality: N/A;  . EYE SURGERY    . ILIAC ARTERY STENT  2011   Bilateral CIA stent  . lazer eye surgery    . RADIOLOGY WITH ANESTHESIA N/A 03/20/2016   Procedure: MRI OF BRAIN;  Surgeon: Medication Radiologist, MD;  Location: [facility] OR;  Service: Radiology;  Laterality: N/A;  . Right Shoulder Surgery  2010  . TUBAL LIGATION      Allergies: Patient has no known allergies.  Medications: Prior to Admission medications   Medication Sig Start Date End Date Taking? Authorizing Provider  aspirin 81 MG EC tablet 1 TABLET DAILY WITH FOOD 06/17/16  Yes Corwin Levins, MD  atorvastatin (LIPITOR) 10 MG tablet 1/2 tab by mouth daily 06/17/16  Yes Corwin Levins, MD  diltiazem (CARDIZEM CD) 180 MG 24 hr capsule Take 1 capsule (180 mg total) by mouth daily. 05/18/16  Yes Bonna Gains Nche, NP  donepezil (ARICEPT) 5 MG tablet Take 1 tablet (5 mg total) by mouth at bedtime. 03/22/16  Yes Costin Otelia Sergeant, MD  fenofibrate micronized (LOFIBRA) 134 MG capsule TAKE 1 CAPSULE (134 MG TOTAL) BY MOUTH DAILY. 05/18/16  Yes Anne Ng, NP  ferrous sulfate 325 (65 FE) MG tablet 1 twice a day 05/18/16  Yes Bonna Gains Nche, NP  hydrochlorothiazide (MICROZIDE) 12.5 MG capsule Take 1  capsule (12.5 mg total) by mouth daily. 06/01/16  Yes Corwin Levins, MD  meclizine (ANTIVERT) 12.5 MG tablet Take 1 tablet (12.5 mg total) by mouth 2 (two) times daily as needed for dizziness (do not use for more than 3days). 05/18/16  Yes Bonna Gains Nche, NP  metoprolol tartrate (LOPRESSOR) 25 MG tablet Take 0.5 tablets (12.5 mg total) by mouth 2 (two) times daily. 05/18/16  Yes Anne Ng, NP  Multiple Vitamin (MULTIVITAMIN WITH MINERALS) TABS tablet Take 1 tablet by mouth daily.   Yes Historical Provider, MD  Multiple Vitamins-Minerals (PRESERVISION AREDS 2) CAPS Take 1  capsule by mouth 2 (two) times daily.   Yes Historical Provider, MD  OLANZapine (ZYPREXA) 2.5 MG tablet Take 2.5 mg by mouth daily. 04/10/16  Yes Historical Provider, MD  pantoprazole (PROTONIX) 40 MG tablet Take 1 tablet (40 mg total) by mouth daily. 06/16/16  Yes Corwin Levins, MD  pentoxifylline (TRENTAL) 400 MG CR tablet TAKE 1 TABLET (400 MG TOTAL) BY MOUTH DAILY. 05/18/16  Yes Bonna Gains Nche, NP  sennosides-docusate sodium (SENOKOT-S) 8.6-50 MG tablet Take 1 tablet by mouth daily. 06/16/16  Yes Corwin Levins, MD  traMADol (ULTRAM) 50 MG tablet Take 1 tablet (50 mg total) by mouth every 8 (eight) hours as needed. 06/16/16  Yes Corwin Levins, MD     Family History  Problem Relation Age of Onset  . Colon cancer Mother   . Diabetes Sister     Amputation    Social History   Social History  . Marital status: Widowed    Spouse name: N/A  . Number of children: 5  . Years of education: N/A   Occupational History  . retired Ship broker   Social History Main Topics  . Smoking status: Former Games developer  . Smokeless tobacco: Never Used     Comment: QUIT IN 74  . Alcohol use No  . Drug use: No  . Sexual activity: Not Asked   Other Topics Concern  . None   Social History Narrative   ** Merged History Encounter **        Review of Systems  Unable to perform ROS: Dementia    Vital Signs: BP 113/68   Pulse 94   Temp 97.6 F (36.4 C) (Axillary)   Resp 20   Ht  (1.499 m)   Wt 92 lb (41.7 kg)   SpO2 97%   BMI 18.58 kg/m   Physical Exam  Constitutional: She is oriented to person, place, and time. She appears well-developed. No distress.  Restless, evidence of subcutaneous fat and muscle wasting  Cardiovascular: Normal rate, regular rhythm and normal heart sounds.   Pulmonary/Chest: Effort normal and breath sounds normal. No respiratory distress.  Abdominal: Soft. There is tenderness (RUQ).  Neurological: She is alert and oriented to person, place, and time.  Psychiatric:    Restless, does not respond to commands, dementia at baseline  Nursing note and vitals reviewed.   Mallampati Score:  MD Evaluation Airway: WNL Heart: WNL Abdomen: WNL Chest/ Lungs: WNL ASA  Classification: 3 Mallampati/Airway Score: Two  Imaging: Ct Abdomen Pelvis Wo Contrast  Result Date: 06/30/2016 CLINICAL DATA:  Lower abdominal pain. Decreased p.o. intake. Weakness for 2 weeks. Vomiting. No urine output. EXAM: CT ABDOMEN AND PELVIS WITHOUT CONTRAST TECHNIQUE: Multidetector CT imaging of the abdomen and pelvis was performed following the standard protocol without IV contrast. COMPARISON:  CT of the abdomen and pelvis 03/15/2016. FINDINGS: Lower chest: Extensive pleural calcifications  are again noted. Asymmetric left lower lobe airspace disease is present. Left-sided pleural thickening and a small effusion are noted. The heart is enlarged. Coronary artery calcifications are present. No significant right pleural or pericardial effusion is present. Hepatobiliary: No focal hepatic lesions are present. The gallbladder is hyperdense likely reflecting multiple stones. There is increased edema within the gallbladder wall. The common bile duct is within normal limits for age. There is extensive fluid surrounding gallbladder and liver. Pancreas: Unremarkable. No pancreatic ductal dilatation or surrounding inflammatory changes. Spleen: Splenic calcifications are present. There is free fluid along the edge of the spleen. Adrenals/Urinary Tract: Adrenal glands are normal bilaterally. Bilateral renal atrophy is again seen. No focal lesions are present. Ureters are within normal limits. The urinary bladder is unremarkable. Stomach/Bowel: The a moderate hiatal hernia present. The stomach and duodenum are otherwise within normal limits. The small bowel is unremarkable. Diverticular changes are present within the ascending colon without inflammation centered about the colon. The transverse colon is mostly  collapsed. Additional diverticular changes are present the descending and sigmoid colon without focal inflammation. Vascular/Lymphatic: Dense atherosclerotic calcifications are present in the aorta and branch vessels. There are dense calcifications which may reflect stenoses at the origins of renal arteries and SMA. Iliac artery stenting is noted. Reproductive: Within normal limits for age. Other: Free fluid extends into the anatomic pelvis. There is no free air. Musculoskeletal: Endplate degenerative changes are most evident L5-S1. No focal lytic or blastic lesion present otherwise. The pelvis is intact. The hips are unremarkable. IMPRESSION: 1. Extensive free fluid inflammatory changes about the neck of the gallbladder suggesting acute cholecystitis in the setting of known gallstones and increased gallbladder wall edema. Gallbladder ultrasound may be useful further evaluation. 2. Diffuse abdominal ascites with free fluid about the liver and spleen and extending into the anatomic pelvis. 3. Ascending and descending colonic diverticulosis without focal inflammation about the colon to suggest diverticulitis. 4. Extensive atherosclerotic disease as described. 5. Multilevel spondylosis in the lumbar spine. 6. Asymmetric left lower lobe airspace disease and effusion. Infection is not excluded. 7. Cardiomegaly without failure. 8. Moderate-sized hiatal hernia. Electronically Signed   By: Marin Roberts M.D.   On: 06/30/2016 16:44   Nm Hepato W/eject Fract  Result Date: 07/01/2016 CLINICAL DATA:  Abdominal pain. EXAM: NUCLEAR MEDICINE HEPATOBILIARY IMAGING TECHNIQUE: Sequential images of the abdomen were obtained out to 60 minutes following intravenous administration of radiopharmaceutical. RADIOPHARMACEUTICALS:  5.2 millicuries mCi Tc-28m Choletec IV, 1.5 mg of morphine. COMPARISON:  CT 06/30/2016 . FINDINGS: Liver, biliary system, bowel visualize normally. The gallbladder does not visualize. Following  administration of morphine the gallbladder also fails to visualize. IMPRESSION: Nonvisualization of the gallbladder suggesting cholecystitis . Electronically Signed   By: Maisie Fus  Register   On: 07/01/2016 16:57   Dg Abd Acute W/chest  Result Date: 06/17/2016 CLINICAL DATA:  Right lower quadrant pain. Nausea, vomiting for 4 days. EXAM: DG ABDOMEN ACUTE W/ 1V CHEST COMPARISON:  Chest x-ray 03/16/2016.  Abdominal CT 03/15/2016. FINDINGS: Stable bilateral calcified pleural plaques. There is cardiomegaly. No acute airspace opacities or effusions. Nonobstructive bowel gas pattern. No free air organomegaly. Bilateral iliac stents noted. Degenerative changes in the thoracolumbar spine and shoulders. No acute bony abnormality. IMPRESSION: Stable bilateral calcified pleural plaques. Cardiomegaly. No acute cardiopulmonary disease. No evidence of bowel obstruction or free air. Electronically Signed   By: Charlett Nose M.D.   On: 06/17/2016 12:53    Labs:  CBC:  Recent Labs  06/30/16 1210 06/30/16 2207  07/01/16 0354 07/02/16 0310  WBC 20.0* 17.2* 16.1* 12.8*  HGB 9.5* 8.5* 8.9* 9.0*  HCT 28.9* 26.0* 26.6* 28.1*  PLT 593* 507* 485* 523*    COAGS:  Recent Labs  04/19/16 1816 04/21/16 1410 06/30/16 2207 07/02/16 1043  INR 1.33 1.10 1.73 1.40    BMP:  Recent Labs  04/21/16 0413 05/18/16 1455 06/30/16 1210 07/01/16 0354 07/02/16 0310  NA 145 139 144 151* 147*  K 3.3* 3.7 3.2* 2.9* 4.6  CL 111 101 102 110 113*  CO2 27 32 GLUCOSE 100* 106* 66 102* 142*  BUN 14 10 21* 25* 27*  CALCIUM 8.3* 8.6 8.3* 7.8* 7.7*  CREATININE 0.71 0.59 1.98* 2.10* 2.22*  GFRNONAA >60  --  22* 20* 19*  GFRAA >60  --  25* 23* 22*    LIVER FUNCTION TESTS:  Recent Labs  03/12/16 0548 03/18/16 0415 04/19/16 0112 06/30/16 1210  BILITOT 0.6 0.5 0.2* 2.5*  AST 46* 29 21 57*  ALT 34 ALKPHOS 43 42 58 70  PROT 6.3* 5.2* 5.0* 5.5*  ALBUMIN 2.9* 2.3* 2.1* 1.9*    TUMOR MARKERS: No  results for input(s): AFPTM, CEA, CA199, CHROMGRNA in the last 8760 hours.  Assessment and Plan: Acute Cholecystitis Patient with acute onset of abdominal pain, nausea, and vomiting is found to have acute cholecystitis.  She was evaluated by surgery in the setting of known gallstones, however was determined to be a poor candidate for surgery.  IR consulted for percutaneous cholecystostomy placement.  Case reviewed by Dr. Deanne Coffer who feels patient is appropriate.  Anticipate procedure today.  She has been NPO.  She did receive SQ Heparin this AM.   This has been held. Risks and benefits discussed with the patient's daughter including bleeding, infection, damage to adjacent structures, and sepsis. All of the patient's daughter's questions were answered, she is agreeable to proceed. Consent signed and in chart.  Thank you for this interesting consult.  I greatly enjoyed meeting Valorie W Zenz and look forward to participating in their care.  A copy of this report was sent to the requesting provider on this date.  Electronically Signed: Hoyt Koch 07/02/2016, 11:29 AM   I spent a total of 40 Minutes    in face to face in clinical consultation, greater than 50% of which was counseling/coordinating care for acute cholecystitis.

## 2016-07-02 NOTE — Sedation Documentation (Signed)
Calmer now

## 2016-07-02 NOTE — Progress Notes (Addendum)
PROGRESS NOTE  Tracy [name]  UJW:[id number] DOB: 10/05/1926 DOA: 06/30/2016 PCP: Oliver Barre, MD Outpatient Specialists:  Subjective: Patient is confused, seen with her son at bedside, HIDA scan is positive for acute cholecystitis. Per general surgery IR consulted for percutaneous cholecystostomy.  Brief Narrative:  Tracy [name] is a 83 y.o. female with medical history significant of dementia, history of CVA and chronic atrial fibrillation, not anticoagulated secondary to previous GI bleed. Patient came into the hospital because of abdominal pain, nausea and vomiting. All of the history obtained from daughter at bedside, patient started to have some abdominal pain and nausea about a week ago, started to have vomiting this morning and worsening abdominal pain so she came into the hospital for further evaluation. CT scan was done and showed acute cholecystitis, general surgery was called initially, recommended internal medicine admission secondary to comorbid conditions including a fib, and probably patient will not be a good surgical candidate.  Assessment & Plan:   Principal Problem:   Sepsis (HCC) Active Problems:   Atrial fibrillation with RVR (HCC)   AKI (acute kidney injury) (HCC)   Abdominal pain   Acute cholecystitis   Protein-calorie malnutrition, severe   Sepsis -Patient presented to the hospital with HR of 150, WBC of 20 and presence of infection (acute cholecystitis). -There is evidence of hypoperfusion with elevated lactic acid of 2.3. -IV antibiotics started and patient given IV fluids in the ED.  Acute cholecystitis -Presented with nausea/vomiting and abdominal pain, CT scan showed acute cholecystitis. -Gen. surgery consulted, recommended nonsurgical approach as patient has multiple comorbid conditions. -Continue Zosyn, HIDA scan is positive, IR consulted for percutaneous cholecystostomy.  AKI -Baseline creatinine is 0.5 from 2/13, presented with creatinine  of 1.98. -Creatinine worsened slightly to 2.2, continue IV fluids and increase rate 100 mL/Hr.  Hypokalemia -Potassium at 3.2, ordered oral supplements. -This is resolved with IV supplementation.  Atrial fibrillation with RVR -Reportedly patient vomited this morning including her medications, restarted Cardizem and Toprol-XL. -CHA2DS2-VASc is likely greater than 6, female, HTN, 2+ CVA and 2+ age. -She is on low-dose aspirin, anticoagulation is contraindicated secondary to recent significant GI bleed.  Dementia without behavioral disturbances -She is on Zyprexa at night, restarted. -Discussed with daughter at bedside she will likely to develop sundowning and acute delirium at night. -Added as needed Haldol at night.   DVT prophylaxis:  Code Status: DNR Family Communication:  Disposition Plan:  Diet: Diet NPO time specified  Consultants:   Gen. surgery  Procedures:   None  Antimicrobials:   Zosyn   Objective: Vitals:   07/01/16 1651 07/01/16 2134 07/02/16 0408 07/02/16 0820  BP: (!) 111/57 111/61 108/78 113/68  Pulse: 86 88 (!) 112 94  Resp: Temp: 97.4 F (36.3 C) 97.3 F (36.3 C) 97.6 F (36.4 C)   TempSrc: Oral Axillary Axillary   SpO2: 98% 98% 97%   Weight:      Height:       No intake or output data in the 24 hours ending 07/02/16 1139 Filed Weights   06/30/16 2058 07/01/16 1114  Weight: 41.9 kg (92 lb 6.4 oz) 41.7 kg (92 lb)    Examination: General exam: Appears calm and comfortable  Respiratory system: Clear to auscultation. Respiratory effort normal. Cardiovascular system: S1 & S2 heard, RRR. No JVD, murmurs, rubs, gallops or clicks. No pedal edema. Gastrointestinal system: Abdomen is nondistended, soft and nontender. No organomegaly or masses felt. Normal bowel sounds heard. Central nervous  system: Alert and oriented. No focal neurological deficits. Extremities: Symmetric 5 x 5 power. Skin: No rashes, lesions or ulcers Psychiatry:  Judgement and insight appear normal. Mood & affect appropriate.   Data Reviewed: I have personally reviewed following labs and imaging studies  CBC:  Recent Labs Lab 06/30/16 1210 06/30/16 2207 07/01/16 0354 07/02/16 0310  WBC 20.0* 17.2* 16.1* 12.8*  HGB 9.5* 8.5* 8.9* 9.0*  HCT 28.9* 26.0* 26.6* 28.1*  MCV 83.8 84.1 84.4 85.4  PLT 593* 507* 485* 523*   Basic Metabolic Panel:  Recent Labs Lab 06/30/16 1210 07/01/16 0354 07/02/16 0310  NA 144 151* 147*  K 3.2* 2.9* 4.6  CL 102 110 113*  CO2 GLUCOSE 66 102* 142*  BUN 21* 25* 27*  CREATININE 1.98* 2.10* 2.22*  CALCIUM 8.3* 7.8* 7.7*   GFR: Estimated Creatinine Clearance: 11.8 mL/min (A) (by C-G formula based on SCr of 2.22 mg/dL (H)). Liver Function Tests:  Recent Labs Lab 06/30/16 1210  AST 57*  ALT 24  ALKPHOS 70  BILITOT 2.5*  PROT 5.5*  ALBUMIN 1.9*    Recent Labs Lab 06/30/16 1210  LIPASE <10*   No results for input(s): AMMONIA in the last 168 hours. Coagulation Profile:  Recent Labs Lab 06/30/16 2207 07/02/16 1043  INR 1.73 1.40   Cardiac Enzymes: No results for input(s): CKTOTAL, CKMB, CKMBINDEX, TROPONINI in the last 168 hours. BNP (last 3 results) No results for input(s): PROBNP in the last 8760 hours. HbA1C:  Recent Labs  06/30/16 2207  HGBA1C 4.8   CBG:  Recent Labs Lab 07/01/16 0309  GLUCAP 117*   Lipid Profile: No results for input(s): CHOL, HDL, LDLCALC, TRIG, CHOLHDL, LDLDIRECT in the last 72 hours. Thyroid Function Tests:  Recent Labs  06/30/16 2207  TSH 2.401   Anemia Panel: No results for input(s): VITAMINB12, FOLATE, FERRITIN, TIBC, IRON, RETICCTPCT in the last 72 hours. Urine analysis:    Component Value Date/Time   COLORURINE AMBER (A) 06/30/2016 1638   APPEARANCEUR CLEAR 06/30/2016 1638   LABSPEC 1.020 06/30/2016 1638   PHURINE 5.0 06/30/2016 1638   GLUCOSEU NEGATIVE 06/30/2016 1638   GLUCOSEU NEGATIVE 05/19/2016 0959   HGBUR NEGATIVE  06/30/2016 1638   BILIRUBINUR NEGATIVE 06/30/2016 1638   KETONESUR NEGATIVE 06/30/2016 1638   PROTEINUR 30 (A) 06/30/2016 1638   UROBILINOGEN 2.0 (A) 05/19/2016 0959   NITRITE NEGATIVE 06/30/2016 1638   LEUKOCYTESUR NEGATIVE 06/30/2016 1638   Sepsis Labs: (procalcitonin:4,lacticidven:4)  )No results found for this or any previous visit (from the past 240 hour(s)).   Invalid input(s): PROCALCITONIN, LACTICACIDVEN   Radiology Studies: Ct Abdomen Pelvis Wo Contrast  Result Date: 06/30/2016 CLINICAL DATA:  Lower abdominal pain. Decreased p.o. intake. Weakness for 2 weeks. Vomiting. No urine output. EXAM: CT ABDOMEN AND PELVIS WITHOUT CONTRAST TECHNIQUE: Multidetector CT imaging of the abdomen and pelvis was performed following the standard protocol without IV contrast. COMPARISON:  CT of the abdomen and pelvis 03/15/2016. FINDINGS: Lower chest: Extensive pleural calcifications are again noted. Asymmetric left lower lobe airspace disease is present. Left-sided pleural thickening and a small effusion are noted. The heart is enlarged. Coronary artery calcifications are present. No significant right pleural or pericardial effusion is present. Hepatobiliary: No focal hepatic lesions are present. The gallbladder is hyperdense likely reflecting multiple stones. There is increased edema within the gallbladder wall. The common bile duct is within normal limits for age. There is extensive fluid surrounding gallbladder and liver. Pancreas: Unremarkable. No pancreatic ductal dilatation  or surrounding inflammatory changes. Spleen: Splenic calcifications are present. There is free fluid along the edge of the spleen. Adrenals/Urinary Tract: Adrenal glands are normal bilaterally. Bilateral renal atrophy is again seen. No focal lesions are present. Ureters are within normal limits. The urinary bladder is unremarkable. Stomach/Bowel: The a moderate hiatal hernia present. The stomach and duodenum are otherwise  within normal limits. The small bowel is unremarkable. Diverticular changes are present within the ascending colon without inflammation centered about the colon. The transverse colon is mostly collapsed. Additional diverticular changes are present the descending and sigmoid colon without focal inflammation. Vascular/Lymphatic: Dense atherosclerotic calcifications are present in the aorta and branch vessels. There are dense calcifications which may reflect stenoses at the origins of renal arteries and SMA. Iliac artery stenting is noted. Reproductive: Within normal limits for age. Other: Free fluid extends into the anatomic pelvis. There is no free air. Musculoskeletal: Endplate degenerative changes are most evident L5-S1. No focal lytic or blastic lesion present otherwise. The pelvis is intact. The hips are unremarkable. IMPRESSION: 1. Extensive free fluid inflammatory changes about the neck of the gallbladder suggesting acute cholecystitis in the setting of known gallstones and increased gallbladder wall edema. Gallbladder ultrasound may be useful further evaluation. 2. Diffuse abdominal ascites with free fluid about the liver and spleen and extending into the anatomic pelvis. 3. Ascending and descending colonic diverticulosis without focal inflammation about the colon to suggest diverticulitis. 4. Extensive atherosclerotic disease as described. 5. Multilevel spondylosis in the lumbar spine. 6. Asymmetric left lower lobe airspace disease and effusion. Infection is not excluded. 7. Cardiomegaly without failure. 8. Moderate-sized hiatal hernia. Electronically Signed   By: Marin Roberts M.D.   On: 06/30/2016 16:44   Nm Hepato W/eject Fract  Result Date: 07/01/2016 CLINICAL DATA:  Abdominal pain. EXAM: NUCLEAR MEDICINE HEPATOBILIARY IMAGING TECHNIQUE: Sequential images of the abdomen were obtained out to 60 minutes following intravenous administration of radiopharmaceutical. RADIOPHARMACEUTICALS:  5.2  millicuries mCi Tc-54m Choletec IV, 1.5 mg of morphine. COMPARISON:  CT 06/30/2016 . FINDINGS: Liver, biliary system, bowel visualize normally. The gallbladder does not visualize. Following administration of morphine the gallbladder also fails to visualize. IMPRESSION: Nonvisualization of the gallbladder suggesting cholecystitis . Electronically Signed   By: Maisie Fus  Register   On: 07/01/2016 16:57        Scheduled Meds: . aspirin EC  81 mg Oral Daily  . diltiazem  180 mg Oral Daily  . donepezil  5 mg Oral QHS  . [START ON 07/03/2016] heparin  5,000 Units Subcutaneous Q8H  . metoprolol tartrate  12.5 mg Oral BID  .  morphine injection  1.5 mg Intravenous Once  . multivitamin  1 tablet Oral BID  . multivitamin with minerals  1 tablet Oral Daily  . OLANZapine  2.5 mg Oral Daily  . pentoxifylline  400 mg Oral TID WC  . piperacillin-tazobactam (ZOSYN)  IV  2.25 g Intravenous Q8H  . sodium chloride flush  3 mL Intravenous Q12H   Continuous Infusions: . dextrose 5 % and 0.45% NaCl 1,000 mL with potassium chloride 30 mEq infusion 75 mL/hr at 07/01/16 2058     LOS: 2 days    Time spent: 35 minutes    Tracy [name] A, MD [facility] Pager [phone]  If 7PM-7AM, please contact night-coverage [email or web address] Password [facility] 07/02/2016, 11:39 AM

## 2016-07-02 NOTE — Procedures (Signed)
.  Perc cholecystostomy tube placed No complication No blood loss. See complete dictation in [facility] PACS.  

## 2016-07-02 NOTE — Progress Notes (Signed)
Chief Complaint/ Subjective: No acute events. Still with abdominal pain, worst in RUQ  Objective: Vital signs in last 24 hours: Temp:  [97.3 F (36.3 C)-97.6 F (36.4 C)] 97.6 F (36.4 C) (03/30 0408) Pulse Rate:  [86-112] 94 (03/30 0820) Resp:  [20] 20 (03/30 0408) BP: (108-113)/(57-78) 113/68 (03/30 0820) SpO2:  [97 %-98 %] 97 % (03/30 0408) Weight:  [41.7 kg (92 lb)] 41.7 kg (92 lb) (03/29 1114) Last BM Date: 07/01/16  Intake/Output from previous day: No intake/output data recorded. Intake/Output this shift: No intake/output data recorded.  General appearance: cachectic and no distress GI: soft, nondistended, diffusely tender without guarding, no palpable mass or organomegaly  Lab Results:   Recent Labs  07/01/16 0354 07/02/16 0310  WBC 16.1* 12.8*  HGB 8.9* 9.0*  HCT 26.6* 28.1*  PLT 485* 523*   BMET  Recent Labs  07/01/16 0354 07/02/16 0310  NA 151* 147*  K 2.9* 4.6  CL 110 113*  CO2 24 24  GLUCOSE 102* 142*  BUN 25* 27*  CREATININE 2.10* 2.22*  CALCIUM 7.8* 7.7*   PT/INR  Recent Labs  06/30/16 2207  LABPROT 20.5*  INR 1.73   ABG No results for input(s): PHART, HCO3 in the last 72 hours.  Invalid input(s): PCO2, PO2  Studies/Results: Ct Abdomen Pelvis Wo Contrast  Result Date: 06/30/2016 CLINICAL DATA:  Lower abdominal pain. Decreased p.o. intake. Weakness for 2 weeks. Vomiting. No urine output. EXAM: CT ABDOMEN AND PELVIS WITHOUT CONTRAST TECHNIQUE: Multidetector CT imaging of the abdomen and pelvis was performed following the standard protocol without IV contrast. COMPARISON:  CT of the abdomen and pelvis 03/15/2016. FINDINGS: Lower chest: Extensive pleural calcifications are again noted. Asymmetric left lower lobe airspace disease is present. Left-sided pleural thickening and a small effusion are noted. The heart is enlarged. Coronary artery calcifications are present. No significant right pleural or pericardial effusion is present.  Hepatobiliary: No focal hepatic lesions are present. The gallbladder is hyperdense likely reflecting multiple stones. There is increased edema within the gallbladder wall. The common bile duct is within normal limits for age. There is extensive fluid surrounding gallbladder and liver. Pancreas: Unremarkable. No pancreatic ductal dilatation or surrounding inflammatory changes. Spleen: Splenic calcifications are present. There is free fluid along the edge of the spleen. Adrenals/Urinary Tract: Adrenal glands are normal bilaterally. Bilateral renal atrophy is again seen. No focal lesions are present. Ureters are within normal limits. The urinary bladder is unremarkable. Stomach/Bowel: The a moderate hiatal hernia present. The stomach and duodenum are otherwise within normal limits. The small bowel is unremarkable. Diverticular changes are present within the ascending colon without inflammation centered about the colon. The transverse colon is mostly collapsed. Additional diverticular changes are present the descending and sigmoid colon without focal inflammation. Vascular/Lymphatic: Dense atherosclerotic calcifications are present in the aorta and branch vessels. There are dense calcifications which may reflect stenoses at the origins of renal arteries and SMA. Iliac artery stenting is noted. Reproductive: Within normal limits for age. Other: Free fluid extends into the anatomic pelvis. There is no free air. Musculoskeletal: Endplate degenerative changes are most evident L5-S1. No focal lytic or blastic lesion present otherwise. The pelvis is intact. The hips are unremarkable. IMPRESSION: 1. Extensive free fluid inflammatory changes about the neck of the gallbladder suggesting acute cholecystitis in the setting of known gallstones and increased gallbladder wall edema. Gallbladder ultrasound may be useful further evaluation. 2. Diffuse abdominal ascites with free fluid about the liver and spleen and extending into the  anatomic pelvis. 3. Ascending and descending colonic diverticulosis without focal inflammation about the colon to suggest diverticulitis. 4. Extensive atherosclerotic disease as described. 5. Multilevel spondylosis in the lumbar spine. 6. Asymmetric left lower lobe airspace disease and effusion. Infection is not excluded. 7. Cardiomegaly without failure. 8. Moderate-sized hiatal hernia. Electronically Signed   By: Marin Roberts M.D.   On: 06/30/2016 16:44   Nm Hepato W/eject Fract  Result Date: 07/01/2016 CLINICAL DATA:  Abdominal pain. EXAM: NUCLEAR MEDICINE HEPATOBILIARY IMAGING TECHNIQUE: Sequential images of the abdomen were obtained out to 60 minutes following intravenous administration of radiopharmaceutical. RADIOPHARMACEUTICALS:  5.2 millicuries mCi Tc-84m Choletec IV, 1.5 mg of morphine. COMPARISON:  CT 06/30/2016 . FINDINGS: Liver, biliary system, bowel visualize normally. The gallbladder does not visualize. Following administration of morphine the gallbladder also fails to visualize. IMPRESSION: Nonvisualization of the gallbladder suggesting cholecystitis . Electronically Signed   By: Maisie Fus  Register   On: 07/01/2016 16:57    Anti-infectives: Anti-infectives    Start     Dose/Rate Route Frequency Ordered Stop   07/01/16 0200  piperacillin-tazobactam (ZOSYN) IVPB 2.25 g     2.25 g 100 mL/hr over 30 Minutes Intravenous Every 8 hours 06/30/16 1714     06/30/16 1730  piperacillin-tazobactam (ZOSYN) IVPB 3.375 g     3.375 g 100 mL/hr over 30 Minutes Intravenous  Once 06/30/16 1722 06/30/16 1923   06/30/16 1715  piperacillin-tazobactam (ZOSYN) IVPB 3.375 g  Status:  Discontinued     3.375 g 100 mL/hr over 30 Minutes Intravenous  Once 06/30/16 1711 06/30/16 1721      Assessment/Plan: s/p * No surgery found * HD 3 with HIDA-confirmed cholecystitis. Given her comorbidities and age recommend perc chole tube and continued antibiotics. She is a poor candidate for surgery/general  anesthesia  LOS: 2 days    Berna Bue 07/02/2016

## 2016-07-03 LAB — CBC
HCT: 26.6 % — ABNORMAL LOW (ref 36.0–46.0)
Hemoglobin: 8.5 g/dL — ABNORMAL LOW (ref 12.0–15.0)
MCH: 27.2 pg (ref 26.0–34.0)
MCHC: 32 g/dL (ref 30.0–36.0)
MCV: 85.3 fL (ref 78.0–100.0)
PLATELETS: 438 10*3/uL — AB (ref 150–400)
RBC: 3.12 MIL/uL — AB (ref 3.87–5.11)
RDW: 20.4 % — ABNORMAL HIGH (ref 11.5–15.5)
WBC: 15.2 10*3/uL — ABNORMAL HIGH (ref 4.0–10.5)

## 2016-07-03 LAB — BASIC METABOLIC PANEL
Anion gap: 12 (ref 5–15)
BUN: 26 mg/dL — ABNORMAL HIGH (ref 6–20)
CALCIUM: 7.6 mg/dL — AB (ref 8.9–10.3)
CO2: 20 mmol/L — AB (ref 22–32)
Chloride: 110 mmol/L (ref 101–111)
Creatinine, Ser: 2.21 mg/dL — ABNORMAL HIGH (ref 0.44–1.00)
GFR, EST AFRICAN AMERICAN: 22 mL/min — AB (ref >60)
GFR, EST NON AFRICAN AMERICAN: 19 mL/min — AB (ref >60)
GLUCOSE: 157 mg/dL — AB (ref 65–99)
Potassium: 4.6 mmol/L (ref 3.5–5.1)
Sodium: 142 mmol/L (ref 135–145)

## 2016-07-03 MED ORDER — HYDROMORPHONE HCL 1 MG/ML IJ SOLN
0.5000 mg | INTRAMUSCULAR | Status: DC | PRN
  Administered 2016-07-03 – 2016-07-04 (×6): 0.5 mg via INTRAVENOUS
  Filled 2016-07-03 (×6): qty 1

## 2016-07-03 MED ORDER — OLANZAPINE 2.5 MG PO TABS
2.5000 mg | ORAL_TABLET | Freq: Every day | ORAL | Status: DC
Start: 2016-07-04 — End: 2016-07-08
  Administered 2016-07-06: 2.5 mg via ORAL
  Filled 2016-07-03 (×4): qty 1

## 2016-07-03 NOTE — Evaluation (Signed)
Clinical/Bedside Swallow Evaluation Patient Details  Name: Tracy [name] MRN: [id number] Date of Birth: 1926-10-09  Today's Date: 07/03/2016 Time: SLP Start Time (ACUTE ONLY): 1505 SLP Stop Time (ACUTE ONLY): 1510 SLP Time Calculation (min) (ACUTE ONLY): 5 min  Past Medical History:  Past Medical History:  Diagnosis Date  . Atrial fibrillation (HCC)   . CAROTID STENOSIS   . CVA (cerebrovascular accident) (HCC) 2002  . DIABETES MELLITUS, TYPE II   . DIVERTICULOSIS, COLON   . DM (diabetes mellitus), type 2 (HCC)   . DVT (deep venous thrombosis) (HCC)   . Gastric ulcer   . GERD   . HTN (hypertension)   . Hx of adenomatous colonic polyps 2007  . Hyperlipidemia   . Hypertension   . OSTEOPENIA   . PVD (peripheral vascular disease) (HCC)    recently dx w/ carotid -left  int carotid >90% 50-60% rt int carotid 02/2010  . PVD (peripheral vascular disease) (HCC)    lf  ABI 0.11 and rt 0.43  . Sinus node dysfunction (HCC) 2011  . Stroke [facility] July 01, 2014   Affected Left side  . Upper GI bleed 02/2016   Past Surgical History:  Past Surgical History:  Procedure Laterality Date  . CAROTID ENDARTERECTOMY    . cataract eye surgery    . CNS vascular stent per Neurosurgeon    . ESOPHAGOGASTRODUODENOSCOPY (EGD) WITH PROPOFOL N/A 02/24/2016   Procedure: ESOPHAGOGASTRODUODENOSCOPY (EGD) WITH PROPOFOL;  Surgeon: Kathi Der, MD;  Location: [facility] ENDOSCOPY;  Service: Gastroenterology;  Laterality: N/A;  . EYE SURGERY    . ILIAC ARTERY STENT  2011   Bilateral CIA stent  . IR GENERIC HISTORICAL  07/02/2016   IR PERC CHOLECYSTOSTOMY 07/02/2016 Oley Balm, MD [facility]-INTERV RAD  . lazer eye surgery    . RADIOLOGY WITH ANESTHESIA N/A 03/20/2016   Procedure: MRI OF BRAIN;  Surgeon: Medication Radiologist, MD;  Location: [facility] OR;  Service: Radiology;  Laterality: N/A;  . Right Shoulder Surgery  2010  . TUBAL LIGATION     HPI:  Tracy [name] a 83 y.o.femalewith medical history  significant of dementia, CVA, DM2, diverticulosis, DVT, gastric ulcer, GERD, HTN and chronic atrial fibrillation, not anticoagulated secondary to previous GI bleed. Patient came into the hospital because of abdominal pain, nausea and vomiting. CT scan was done and showed acute cholecystitis. Pt also admitted with sepsis, AKI, hypokalemia, afib with RVR, malnutrition. S/p percutaneous cholecystostomy placement. Seen for swallowing evaluation during prior admission, found to have grossly intact oropharyngeal swallowing fx, regular diet with thin liquids recommended (07/05/14). Currently on clear liquids, to be advanced by MD as tolerated.    Assessment / Plan / Recommendation Clinical Impression  Patient presents with severe risk for aspiration given multiple risk factors including: lethargy, cognitive impairment, prior CVA, history of GERD and overall deconditioning. Pt is extremely lethargic, does not follow basic commands or respond verbally; this may be due to pain medications recently administered by RN. Opens her eyes and makes minimal oral movements, vocalizations with upright positioning, closes lips in response to tactile/thermal stimulation of ice chip. Per MD diet orders are clear liquids. Provided education to family regarding risks for aspiration and to avoid feeding pt when she is not fully alert. Recommend NPO unless fully alert. SLP will f/u.  SLP Visit Diagnosis: Dysphagia, unspecified (R13.10)    Aspiration Risk  Risk for inadequate nutrition/hydration;Severe aspiration risk    Diet Recommendation Other (Comment) (Per MD diet clear liquids. Recommend NPO unless fully alert.)  Supervision: Full supervision/cueing for compensatory strategies Compensations: Slow rate;Small sips/bites Postural Changes: Seated upright at 90 degrees    Other  Recommendations Oral Care Recommendations: Oral care QID   Follow up Recommendations Other (comment) (TBD)      Frequency and Duration min 2x/week   2 weeks       Prognosis Prognosis for Safe Diet Advancement: Fair Barriers to Reach Goals: Cognitive deficits      Swallow Study   General Date of Onset: 06/30/16 HPI: Tracy [name] a 83 y.o.femalewith medical history significant of dementia, CVA, DM2, diverticulosis, DVT, gastric ulcer, GERD, HTN and chronic atrial fibrillation, not anticoagulated secondary to previous GI bleed. Patient came into the hospital because of abdominal pain, nausea and vomiting. CT scan was done and showed acute cholecystitis. Pt also admitted with sepsis, AKI, hypokalemia, afib with RVR, malnutrition. S/p percutaneous cholecystostomy placement. Seen for swallowing evaluation during prior admission, found to have grossly intact oropharyngeal swallowing fx, regular diet with thin liquids recommended (07/05/14). Currently on clear liquids, to be advanced by MD as tolerated.  Type of Study: Bedside Swallow Evaluation Previous Swallow Assessment: see HPI Diet Prior to this Study: Thin liquids;Other (Comment) (Clear liquids) Temperature Spikes Noted: No Respiratory Status: Nasal cannula History of Recent Intubation: No Behavior/Cognition: Lethargic/Drowsy Oral Cavity Assessment: Dry Oral Care Completed by SLP: No Oral Cavity - Dentition: Other (Comment) (difficult to assess) Self-Feeding Abilities: Total assist Patient Positioning: Upright in bed Baseline Vocal Quality: Not observed Volitional Cough: Cognitively unable to elicit Volitional Swallow: Unable to elicit    Oral/Motor/Sensory Function Overall Oral Motor/Sensory Function: Other (comment) (difficult to assess)   Ice Chips Ice chips: Impaired Presentation: Spoon Oral Phase Impairments: Poor awareness of bolus   Thin Liquid Thin Liquid: Not tested    Nectar Thick Nectar Thick Liquid: Not tested   Honey Thick Honey Thick Liquid: Not tested   Puree Puree: Not tested   Solid   GO   Solid: Not tested        Tracy [name] 07/03/2016,3:20  PM   Rondel Baton, MS CF-SLP Speech-Language Pathologist [phone]

## 2016-07-03 NOTE — Progress Notes (Signed)
Referring Physician(s): Ahmed Prima  Supervising Physician: Oley Balm  Patient Status:  [facility] - In-pt  Chief Complaint:  Percutaneous cholecystostomy drain placed 3/30  Subjective:  Minimal drainage Pt really without response Sleeping -- just has had pain meds Family at bedside   Allergies: Patient has no known allergies.  Medications: Prior to Admission medications   Medication Sig Start Date End Date Taking? Authorizing Provider  aspirin 81 MG EC tablet 1 TABLET DAILY WITH FOOD 06/17/16  Yes Corwin Levins, MD  atorvastatin (LIPITOR) 10 MG tablet 1/2 tab by mouth daily 06/17/16  Yes Corwin Levins, MD  diltiazem (CARDIZEM CD) 180 MG 24 hr capsule Take 1 capsule (180 mg total) by mouth daily. 05/18/16  Yes Bonna Gains Nche, NP  donepezil (ARICEPT) 5 MG tablet Take 1 tablet (5 mg total) by mouth at bedtime. 03/22/16  Yes Costin Otelia Sergeant, MD  fenofibrate micronized (LOFIBRA) 134 MG capsule TAKE 1 CAPSULE (134 MG TOTAL) BY MOUTH DAILY. 05/18/16  Yes Anne Ng, NP  ferrous sulfate 325 (65 FE) MG tablet 1 twice a day 05/18/16  Yes Bonna Gains Nche, NP  hydrochlorothiazide (MICROZIDE) 12.5 MG capsule Take 1 capsule (12.5 mg total) by mouth daily. 06/01/16  Yes Corwin Levins, MD  meclizine (ANTIVERT) 12.5 MG tablet Take 1 tablet (12.5 mg total) by mouth 2 (two) times daily as needed for dizziness (do not use for more than 3days). 05/18/16  Yes Bonna Gains Nche, NP  metoprolol tartrate (LOPRESSOR) 25 MG tablet Take 0.5 tablets (12.5 mg total) by mouth 2 (two) times daily. 05/18/16  Yes Anne Ng, NP  Multiple Vitamin (MULTIVITAMIN WITH MINERALS) TABS tablet Take 1 tablet by mouth daily.   Yes Historical Provider, MD  Multiple Vitamins-Minerals (PRESERVISION AREDS 2) CAPS Take 1 capsule by mouth 2 (two) times daily.   Yes Historical Provider, MD  OLANZapine (ZYPREXA) 2.5 MG tablet Take 2.5 mg by mouth daily. 04/10/16  Yes Historical Provider, MD  pantoprazole (PROTONIX)  40 MG tablet Take 1 tablet (40 mg total) by mouth daily. 06/16/16  Yes Corwin Levins, MD  pentoxifylline (TRENTAL) 400 MG CR tablet TAKE 1 TABLET (400 MG TOTAL) BY MOUTH DAILY. 05/18/16  Yes Bonna Gains Nche, NP  sennosides-docusate sodium (SENOKOT-S) 8.6-50 MG tablet Take 1 tablet by mouth daily. 06/16/16  Yes Corwin Levins, MD  traMADol (ULTRAM) 50 MG tablet Take 1 tablet (50 mg total) by mouth every 8 (eight) hours as needed. 06/16/16  Yes Corwin Levins, MD     Vital Signs: BP (!) 124/58 (BP Location: Right Arm)   Pulse (!) 109   Temp 98.4 F (36.9 C) (Axillary)   Resp 18   Ht  (1.499 m)   Wt 108 lb 8 oz (49.2 kg)   SpO2 99%   BMI 21.91 kg/m   Physical Exam  Abdominal: Soft. Bowel sounds are normal. There is no tenderness.  Skin: Skin is warm and dry.  Skin site is clean and dry No bleeding Drainage 20 cc yesterday  20 cc in bag: serous/with purulence noted Wbc up to 15 today afeb  Nursing note and vitals reviewed.   Imaging: Ct Abdomen Pelvis Wo Contrast  Result Date: 06/30/2016 CLINICAL DATA:  Lower abdominal pain. Decreased p.o. intake. Weakness for 2 weeks. Vomiting. No urine output. EXAM: CT ABDOMEN AND PELVIS WITHOUT CONTRAST TECHNIQUE: Multidetector CT imaging of the abdomen and pelvis was performed following the standard protocol without IV contrast. COMPARISON:  CT of the abdomen and pelvis 03/15/2016. FINDINGS: Lower chest: Extensive pleural calcifications are again noted. Asymmetric left lower lobe airspace disease is present. Left-sided pleural thickening and a small effusion are noted. The heart is enlarged. Coronary artery calcifications are present. No significant right pleural or pericardial effusion is present. Hepatobiliary: No focal hepatic lesions are present. The gallbladder is hyperdense likely reflecting multiple stones. There is increased edema within the gallbladder wall. The common bile duct is within normal limits for age. There is extensive fluid  surrounding gallbladder and liver. Pancreas: Unremarkable. No pancreatic ductal dilatation or surrounding inflammatory changes. Spleen: Splenic calcifications are present. There is free fluid along the edge of the spleen. Adrenals/Urinary Tract: Adrenal glands are normal bilaterally. Bilateral renal atrophy is again seen. No focal lesions are present. Ureters are within normal limits. The urinary bladder is unremarkable. Stomach/Bowel: The a moderate hiatal hernia present. The stomach and duodenum are otherwise within normal limits. The small bowel is unremarkable. Diverticular changes are present within the ascending colon without inflammation centered about the colon. The transverse colon is mostly collapsed. Additional diverticular changes are present the descending and sigmoid colon without focal inflammation. Vascular/Lymphatic: Dense atherosclerotic calcifications are present in the aorta and branch vessels. There are dense calcifications which may reflect stenoses at the origins of renal arteries and SMA. Iliac artery stenting is noted. Reproductive: Within normal limits for age. Other: Free fluid extends into the anatomic pelvis. There is no free air. Musculoskeletal: Endplate degenerative changes are most evident L5-S1. No focal lytic or blastic lesion present otherwise. The pelvis is intact. The hips are unremarkable. IMPRESSION: 1. Extensive free fluid inflammatory changes about the neck of the gallbladder suggesting acute cholecystitis in the setting of known gallstones and increased gallbladder wall edema. Gallbladder ultrasound may be useful further evaluation. 2. Diffuse abdominal ascites with free fluid about the liver and spleen and extending into the anatomic pelvis. 3. Ascending and descending colonic diverticulosis without focal inflammation about the colon to suggest diverticulitis. 4. Extensive atherosclerotic disease as described. 5. Multilevel spondylosis in the lumbar spine. 6. Asymmetric  left lower lobe airspace disease and effusion. Infection is not excluded. 7. Cardiomegaly without failure. 8. Moderate-sized hiatal hernia. Electronically Signed   By: Marin Roberts M.D.   On: 06/30/2016 16:44   Nm Hepato W/eject Fract  Result Date: 07/01/2016 CLINICAL DATA:  Abdominal pain. EXAM: NUCLEAR MEDICINE HEPATOBILIARY IMAGING TECHNIQUE: Sequential images of the abdomen were obtained out to 60 minutes following intravenous administration of radiopharmaceutical. RADIOPHARMACEUTICALS:  5.2 millicuries mCi Tc-70m Choletec IV, 1.5 mg of morphine. COMPARISON:  CT 06/30/2016 . FINDINGS: Liver, biliary system, bowel visualize normally. The gallbladder does not visualize. Following administration of morphine the gallbladder also fails to visualize. IMPRESSION: Nonvisualization of the gallbladder suggesting cholecystitis . Electronically Signed   By: Maisie Fus  Register   On: 07/01/2016 16:57   Ir Perc Cholecystostomy  Result Date: 07/02/2016 CLINICAL DATA:  Acute cholecystitis, non surgical candidate EXAM: PERCUTANEOUS CHOLECYSTOSTOMY TUBE PLACEMENT WITH ULTRASOUND AND FLUOROSCOPIC GUIDANCE FLUOROSCOPY TIME:  0.6 minute (24 uGym2 DAP) TECHNIQUE: The procedure, risks (including but not limited to bleeding, infection, organ damage ), benefits, and alternatives were explained to the daughter. Questions regarding the procedure were encouraged and answered. The daughter understands and consents to the procedure. Survey ultrasound of the abdomen was performed and an appropriate skin entry site was identified. Skin site was marked, prepped with chlorhexidine, and draped in usual sterile fashion, and infiltrated locally with 1% lidocaine. Intravenous Fentanyl and Versed  were administered as conscious sedation during continuous monitoring of the patient's level of consciousness and physiological / cardiorespiratory status by the radiology RN, with a total moderate sedation time of 10 minutes. Under real-time  ultrasound guidance, gallbladder was accessed using a transhepatic approach with a 21-gauge needle. Ultrasound image documentation was saved. Bile returned through the hub. Needle was exchanged over a 018 guidewire for transitional dilator which allowed placement of 035 J wire. Over this, a 10.2 French pigtail catheter was advanced and formed centrally in the gallbladder lumen. Small contrast injection confirmed appropriate position. Catheter secured externally with 0 Prolene suture and placed external drain bag. Patient tolerated the procedure well. COMPLICATIONS: COMPLICATIONS none IMPRESSION: 1. Technically successful percutaneous cholecystostomy tube placement with ultrasound and fluoroscopic guidance. Electronically Signed   By: Corlis Leak M.D.   On: 07/02/2016 16:58    Labs:  CBC:  Recent Labs  06/30/16 2207 07/01/16 0354 07/02/16 0310 07/03/16 0203  WBC 17.2* 16.1* 12.8* 15.2*  HGB 8.5* 8.9* 9.0* 8.5*  HCT 26.0* 26.6* 28.1* 26.6*  PLT 507* 485* 523* 438*    COAGS:  Recent Labs  04/19/16 1816 04/21/16 1410 06/30/16 2207 07/02/16 1043  INR 1.33 1.10 1.73 1.40    BMP:  Recent Labs  06/30/16 1210 07/01/16 0354 07/02/16 0310 07/03/16 0203  NA 144 151* 147* 142  K 3.2* 2.9* 4.6 4.6  CL 102 110 113* 110  CO2 20*  GLUCOSE 66 102* 142* 157*  BUN 21* 25* 27* 26*  CALCIUM 8.3* 7.8* 7.7* 7.6*  CREATININE 1.98* 2.10* 2.22* 2.21*  GFRNONAA 22* 20* 19* 19*  GFRAA 25* 23* 22* 22*    LIVER FUNCTION TESTS:  Recent Labs  03/12/16 0548 03/18/16 0415 04/19/16 0112 06/30/16 1210  BILITOT 0.6 0.5 0.2* 2.5*  AST 46* 29 21 57*  ALT 34 ALKPHOS 43 42 58 70  PROT 6.3* 5.2* 5.0* 5.5*  ALBUMIN 2.9* 2.3* 2.1* 1.9*    Assessment and Plan:  Percutaneous chole drain intact Will follow Plan for drain 6 weeks -- plan per [facility] IR OP drain clinic to follow pt Orders in place Will ned flushed at home daily with 10 cc sterile saline  Electronically  Signed: Lady Wisham A 07/03/2016, 11:37 AM   I spent a total of 15 Minutes at the the patient's bedside AND on the patient's hospital floor or unit, greater than 50% of which was counseling/coordinating care for perc chole drain

## 2016-07-03 NOTE — Progress Notes (Addendum)
PROGRESS NOTE  Tracy [name]  [name]:[id number] DOB: May 22, 1926 DOA: 06/30/2016 PCP: Oliver Barre, MD Outpatient Specialists:  Subjective: Seen with her son at bedside upset that "nobody is telling me nothing" I have answered all his questions. Patient is somnolent and flailing arms. Per general surgery can feed when she is more awake and alert.  Brief Narrative:  Tracy [name] is a 83 y.o. female with medical history significant of dementia, history of CVA and chronic atrial fibrillation, not anticoagulated secondary to previous GI bleed. Patient came into the hospital because of abdominal pain, nausea and vomiting. All of the history obtained from daughter at bedside, patient started to have some abdominal pain and nausea about a week ago, started to have vomiting this morning and worsening abdominal pain so she came into the hospital for further evaluation. CT scan was done and showed acute cholecystitis, general surgery was called initially, recommended internal medicine admission secondary to comorbid conditions including a fib, and probably patient will not be a good surgical candidate.  Assessment & Plan:   Principal Problem:   Sepsis (HCC) Active Problems:   Atrial fibrillation with RVR (HCC)   AKI (acute kidney injury) (HCC)   Abdominal pain   Acute cholecystitis   Protein-calorie malnutrition, severe   Sepsis -Patient presented to the hospital with HR of 150, WBC of 20 and presence of infection (acute cholecystitis). -There is evidence of hypoperfusion with elevated lactic acid of 2.3. -IV antibiotics started and patient given IV fluids in the ED.  Acute cholecystitis -Presented with nausea/vomiting and abdominal pain, CT scan showed acute cholecystitis. -Gen. surgery consulted, recommended nonsurgical approach as patient has multiple comorbid conditions. -Continue Zosyn, HIDA scan is positive, IR consulted for percutaneous cholecystostomy.  AKI -Baseline creatinine  is 0.5 from 2/13, presented with creatinine of 1.98. -Continue IV fluid at 100 mL/hour, check BMP in a.m.  Hypokalemia -Potassium at 3.2, ordered oral supplements. -This is resolved with IV supplementation.  Atrial fibrillation with RVR -Reportedly patient vomited this morning including her medications, restarted Cardizem and Toprol-XL. -CHA2DS2-VASc is likely greater than 6, female, HTN, 2+ CVA and 2+ age. -She is on low-dose aspirin, anticoagulation is contraindicated secondary to recent significant GI bleed.  Dementia without behavioral disturbances -She is on Zyprexa at night, restarted. -Discussed with daughter at bedside she will likely to develop sundowning and acute delirium at night. -Added as needed Haldol at night.  Somnolence -This is likely secondary to pain medications, acute illness and dehydration. -Tried to decrease sedative medications, continue antibiotics and IV fluid hydration.  Severe protein energy malnutrition -Currently nothing by mouth we'll start on clear liquids for today and it has diet as tolerated.  DVT prophylaxis:  Code Status: DNR Family Communication:  Disposition Plan:  Diet: Diet NPO time specified  Consultants:   Gen. surgery  Procedures:   None  Antimicrobials:   Zosyn   Objective: Vitals:   07/02/16 1649 07/02/16 2013 07/02/16 2321 07/03/16 0508  BP: 126/77 (!) 114/57 (!) 112/55 (!) 124/58  Pulse: 97 (!) 102 89 (!) 109  Resp: (!) Temp: 99.9 F (37.7 C) 99.9 F (37.7 C)  98.4 F (36.9 C)  TempSrc: Oral Axillary  Axillary  SpO2:  99%  99%  Weight:    49.2 kg (108 lb 8 oz)  Height:        Intake/Output Summary (Last 24 hours) at 07/03/16 1056 Last data filed at 07/03/16 0639  Gross per 24 hour  Intake  205 ml  Output               20 ml  Net              185 ml   Filed Weights   06/30/16 2058 07/01/16 1114 07/03/16 0508  Weight: 41.9 kg (92 lb 6.4 oz) 41.7 kg (92 lb) 49.2 kg (108 lb 8 oz)     Examination: General exam: Appears calm and comfortable  Respiratory system: Clear to auscultation. Respiratory effort normal. Cardiovascular system: S1 & S2 heard, RRR. No JVD, murmurs, rubs, gallops or clicks. No pedal edema. Gastrointestinal system: Abdomen is nondistended, soft and nontender. No organomegaly or masses felt. Normal bowel sounds heard. Central nervous system: Alert and oriented. No focal neurological deficits. Extremities: Symmetric 5 x 5 power. Skin: No rashes, lesions or ulcers Psychiatry: Judgement and insight appear normal. Mood & affect appropriate.   Data Reviewed: I have personally reviewed following labs and imaging studies  CBC:  Recent Labs Lab 06/30/16 1210 06/30/16 2207 07/01/16 0354 07/02/16 0310 07/03/16 0203  WBC 20.0* 17.2* 16.1* 12.8* 15.2*  HGB 9.5* 8.5* 8.9* 9.0* 8.5*  HCT 28.9* 26.0* 26.6* 28.1* 26.6*  MCV 83.8 84.1 84.4 85.4 85.3  PLT 593* 507* 485* 523* 438*   Basic Metabolic Panel:  Recent Labs Lab 06/30/16 1210 07/01/16 0354 07/02/16 0310 07/03/16 0203  NA 144 151* 147* 142  K 3.2* 2.9* 4.6 4.6  CL 102 110 113* 110  CO2 20*  GLUCOSE 66 102* 142* 157*  BUN 21* 25* 27* 26*  CREATININE 1.98* 2.10* 2.22* 2.21*  CALCIUM 8.3* 7.8* 7.7* 7.6*   GFR: Estimated Creatinine Clearance: 12.2 mL/min (A) (by C-G formula based on SCr of 2.21 mg/dL (H)). Liver Function Tests:  Recent Labs Lab 06/30/16 1210  AST 57*  ALT 24  ALKPHOS 70  BILITOT 2.5*  PROT 5.5*  ALBUMIN 1.9*    Recent Labs Lab 06/30/16 1210  LIPASE <10*   No results for input(s): AMMONIA in the last 168 hours. Coagulation Profile:  Recent Labs Lab 06/30/16 2207 07/02/16 1043  INR 1.73 1.40   Cardiac Enzymes: No results for input(s): CKTOTAL, CKMB, CKMBINDEX, TROPONINI in the last 168 hours. BNP (last 3 results) No results for input(s): PROBNP in the last 8760 hours. HbA1C:  Recent Labs  06/30/16 2207  HGBA1C 4.8   CBG:  Recent  Labs Lab 07/01/16 0309  GLUCAP 117*   Lipid Profile: No results for input(s): CHOL, HDL, LDLCALC, TRIG, CHOLHDL, LDLDIRECT in the last 72 hours. Thyroid Function Tests:  Recent Labs  06/30/16 2207  TSH 2.401   Anemia Panel: No results for input(s): VITAMINB12, FOLATE, FERRITIN, TIBC, IRON, RETICCTPCT in the last 72 hours. Urine analysis:    Component Value Date/Time   COLORURINE AMBER (A) 06/30/2016 1638   APPEARANCEUR CLEAR 06/30/2016 1638   LABSPEC 1.020 06/30/2016 1638   PHURINE 5.0 06/30/2016 1638   GLUCOSEU NEGATIVE 06/30/2016 1638   GLUCOSEU NEGATIVE 05/19/2016 0959   HGBUR NEGATIVE 06/30/2016 1638   BILIRUBINUR NEGATIVE 06/30/2016 1638   KETONESUR NEGATIVE 06/30/2016 1638   PROTEINUR 30 (A) 06/30/2016 1638   UROBILINOGEN 2.0 (A) 05/19/2016 0959   NITRITE NEGATIVE 06/30/2016 1638   LEUKOCYTESUR NEGATIVE 06/30/2016 1638   Sepsis Labs: (procalcitonin:4,lacticidven:4)  ) Recent Results (from the past 240 hour(s))  Aerobic/Anaerobic Culture (surgical/deep wound)     Status: None (Preliminary result)   Collection Time: 07/02/16  5:19 PM  Result Value Ref Range Status  Specimen Description DRAINAGE BILE  Final   Special Requests NONE  Final   Gram Stain   Final    ABUNDANT WBC PRESENT, PREDOMINANTLY PMN NO ORGANISMS SEEN    Culture PENDING  Incomplete   Report Status PENDING  Incomplete     Invalid input(s): PROCALCITONIN, LACTICACIDVEN   Radiology Studies: Nm Hepato W/eject Fract  Result Date: 07/01/2016 CLINICAL DATA:  Abdominal pain. EXAM: NUCLEAR MEDICINE HEPATOBILIARY IMAGING TECHNIQUE: Sequential images of the abdomen were obtained out to 60 minutes following intravenous administration of radiopharmaceutical. RADIOPHARMACEUTICALS:  5.2 millicuries mCi Tc-31m Choletec IV, 1.5 mg of morphine. COMPARISON:  CT 06/30/2016 . FINDINGS: Liver, biliary system, bowel visualize normally. The gallbladder does not visualize. Following administration of  morphine the gallbladder also fails to visualize. IMPRESSION: Nonvisualization of the gallbladder suggesting cholecystitis . Electronically Signed   By: Maisie Fus  Register   On: 07/01/2016 16:57   Ir Perc Cholecystostomy  Result Date: 07/02/2016 CLINICAL DATA:  Acute cholecystitis, non surgical candidate EXAM: PERCUTANEOUS CHOLECYSTOSTOMY TUBE PLACEMENT WITH ULTRASOUND AND FLUOROSCOPIC GUIDANCE FLUOROSCOPY TIME:  0.6 minute (24 uGym2 DAP) TECHNIQUE: The procedure, risks (including but not limited to bleeding, infection, organ damage ), benefits, and alternatives were explained to the daughter. Questions regarding the procedure were encouraged and answered. The daughter understands and consents to the procedure. Survey ultrasound of the abdomen was performed and an appropriate skin entry site was identified. Skin site was marked, prepped with chlorhexidine, and draped in usual sterile fashion, and infiltrated locally with 1% lidocaine. Intravenous Fentanyl and Versed were administered as conscious sedation during continuous monitoring of the patient's level of consciousness and physiological / cardiorespiratory status by the radiology RN, with a total moderate sedation time of 10 minutes. Under real-time ultrasound guidance, gallbladder was accessed using a transhepatic approach with a 21-gauge needle. Ultrasound image documentation was saved. Bile returned through the hub. Needle was exchanged over a 018 guidewire for transitional dilator which allowed placement of 035 J wire. Over this, a 10.2 French pigtail catheter was advanced and formed centrally in the gallbladder lumen. Small contrast injection confirmed appropriate position. Catheter secured externally with 0 Prolene suture and placed external drain bag. Patient tolerated the procedure well. COMPLICATIONS: COMPLICATIONS none IMPRESSION: 1. Technically successful percutaneous cholecystostomy tube placement with ultrasound and fluoroscopic guidance.  Electronically Signed   By: Corlis Leak M.D.   On: 07/02/2016 16:58        Scheduled Meds: . aspirin EC  81 mg Oral Daily  . diltiazem  180 mg Oral Daily  . donepezil  5 mg Oral QHS  . heparin  5,000 Units Subcutaneous Q8H  . metoprolol tartrate  12.5 mg Oral BID  .  morphine injection  1.5 mg Intravenous Once  . multivitamin  1 tablet Oral BID  . multivitamin with minerals  1 tablet Oral Daily  . OLANZapine  5 mg Oral Daily  . pentoxifylline  400 mg Oral TID WC  . piperacillin-tazobactam (ZOSYN)  IV  2.25 g Intravenous Q8H  . sodium chloride flush  3 mL Intravenous Q12H   Continuous Infusions: . dextrose 5 % and 0.45% NaCl 1,000 mL infusion 100 mL/hr at 07/03/16 0406     LOS: 3 days    Time spent: 35 minutes    Khamari Sheehan A, MD [facility] Pager [phone]  If 7PM-7AM, please contact night-coverage [email or web address] Password [facility] 07/03/2016, 10:56 AM

## 2016-07-03 NOTE — Progress Notes (Addendum)
CC: unable to obtain Subjective: Had gb drain placed yesterday  Objective: Vital signs in last 24 hours: Temp:  [98.4 F (36.9 C)-99.9 F (37.7 C)] 98.4 F (36.9 C) (03/31 0508) Pulse Rate:  [89-120] 109 (03/31 0508) Resp:  [16-24] 18 (03/31 0508) BP: (112-132)/(55-100) 124/58 (03/31 0508) SpO2:  [6 %-99 %] 99 % (03/31 0508) Weight:  [49.2 kg (108 lb 8 oz)] 49.2 kg (108 lb 8 oz) (03/31 0508) Last BM Date: 07/02/16  Intake/Output from previous day: 03/30 0701 - 03/31 0700 In: 205 [IV Piggyback:200] Out: 20 [Drains:20] Intake/Output this shift: No intake/output data recorded.  In mittens; Doesn't follow commands Moves all extremities Trachea midline, no thyroid masses Soft, seems tender in RUQ, nd Drain intact - very light bile No edema  Lab Results:   Recent Labs  07/02/16 0310 07/03/16 0203  WBC 12.8* 15.2*  HGB 9.0* 8.5*  HCT 28.1* 26.6*  PLT 523* 438*   BMET  Recent Labs  07/02/16 0310 07/03/16 0203  NA 147* 142  K 4.6 4.6  CL 113* 110  CO2 24 20*  GLUCOSE 142* 157*  BUN 27* 26*  CREATININE 2.22* 2.21*  CALCIUM 7.7* 7.6*   PT/INR  Recent Labs  06/30/16 2207 07/02/16 1043  LABPROT 20.5* 17.3*  INR 1.73 1.40   ABG No results for input(s): PHART, HCO3 in the last 72 hours.  Invalid input(s): PCO2, PO2  Studies/Results: Nm Hepato W/eject Fract  Result Date: 07/01/2016 CLINICAL DATA:  Abdominal pain. EXAM: NUCLEAR MEDICINE HEPATOBILIARY IMAGING TECHNIQUE: Sequential images of the abdomen were obtained out to 60 minutes following intravenous administration of radiopharmaceutical. RADIOPHARMACEUTICALS:  5.2 millicuries mCi Tc-47m Choletec IV, 1.5 mg of morphine. COMPARISON:  CT 06/30/2016 . FINDINGS: Liver, biliary system, bowel visualize normally. The gallbladder does not visualize. Following administration of morphine the gallbladder also fails to visualize. IMPRESSION: Nonvisualization of the gallbladder suggesting cholecystitis .  Electronically Signed   By: Maisie Fus  Register   On: 07/01/2016 16:57   Ir Perc Cholecystostomy  Result Date: 07/02/2016 CLINICAL DATA:  Acute cholecystitis, non surgical candidate EXAM: PERCUTANEOUS CHOLECYSTOSTOMY TUBE PLACEMENT WITH ULTRASOUND AND FLUOROSCOPIC GUIDANCE FLUOROSCOPY TIME:  0.6 minute (24 uGym2 DAP) TECHNIQUE: The procedure, risks (including but not limited to bleeding, infection, organ damage ), benefits, and alternatives were explained to the daughter. Questions regarding the procedure were encouraged and answered. The daughter understands and consents to the procedure. Survey ultrasound of the abdomen was performed and an appropriate skin entry site was identified. Skin site was marked, prepped with chlorhexidine, and draped in usual sterile fashion, and infiltrated locally with 1% lidocaine. Intravenous Fentanyl and Versed were administered as conscious sedation during continuous monitoring of the patient's level of consciousness and physiological / cardiorespiratory status by the radiology RN, with a total moderate sedation time of 10 minutes. Under real-time ultrasound guidance, gallbladder was accessed using a transhepatic approach with a 21-gauge needle. Ultrasound image documentation was saved. Bile returned through the hub. Needle was exchanged over a 018 guidewire for transitional dilator which allowed placement of 035 J wire. Over this, a 10.2 French pigtail catheter was advanced and formed centrally in the gallbladder lumen. Small contrast injection confirmed appropriate position. Catheter secured externally with 0 Prolene suture and placed external drain bag. Patient tolerated the procedure well. COMPLICATIONS: COMPLICATIONS none IMPRESSION: 1. Technically successful percutaneous cholecystostomy tube placement with ultrasound and fluoroscopic guidance. Electronically Signed   By: Corlis Leak M.D.   On: 07/02/2016 16:58    Anti-infectives: Anti-infectives  Start     Dose/Rate  Route Frequency Ordered Stop   07/01/16 0200  piperacillin-tazobactam (ZOSYN) IVPB 2.25 g     2.25 g 100 mL/hr over 30 Minutes Intravenous Every 8 hours 06/30/16 1714     06/30/16 1730  piperacillin-tazobactam (ZOSYN) IVPB 3.375 g     3.375 g 100 mL/hr over 30 Minutes Intravenous  Once 06/30/16 1722 06/30/16 1923   06/30/16 1715  piperacillin-tazobactam (ZOSYN) IVPB 3.375 g  Status:  Discontinued     3.375 g 100 mL/hr over 30 Minutes Intravenous  Once 06/30/16 1711 06/30/16 1721      Assessment/Plan: Cholecystitis s/p IR percutaneous cholecystostomy tube 3/30  Cont abx f/u cultures Can have diet but not sure of mental status - consider speech consult?  Mary Sella. Andrey Campanile, MD, FACS General, Bariatric, & Minimally Invasive Surgery [facility], [place]   LOS: 3 days    Atilano Ina 07/03/2016

## 2016-07-04 LAB — BASIC METABOLIC PANEL
ANION GAP: 8 (ref 5–15)
BUN: 23 mg/dL — ABNORMAL HIGH (ref 6–20)
CALCIUM: 7.9 mg/dL — AB (ref 8.9–10.3)
CO2: 23 mmol/L (ref 22–32)
CREATININE: 1.97 mg/dL — AB (ref 0.44–1.00)
Chloride: 110 mmol/L (ref 101–111)
GFR calc Af Amer: 25 mL/min — ABNORMAL LOW (ref >60)
GFR calc non Af Amer: 22 mL/min — ABNORMAL LOW (ref >60)
GLUCOSE: 132 mg/dL — AB (ref 65–99)
POTASSIUM: 4.2 mmol/L (ref 3.5–5.1)
Sodium: 141 mmol/L (ref 135–145)

## 2016-07-04 LAB — CBC
HCT: 26.4 % — ABNORMAL LOW (ref 36.0–46.0)
HEMOGLOBIN: 8.5 g/dL — AB (ref 12.0–15.0)
MCH: 27.8 pg (ref 26.0–34.0)
MCHC: 32.2 g/dL (ref 30.0–36.0)
MCV: 86.3 fL (ref 78.0–100.0)
PLATELETS: 332 10*3/uL (ref 150–400)
RBC: 3.06 MIL/uL — AB (ref 3.87–5.11)
RDW: 21.3 % — ABNORMAL HIGH (ref 11.5–15.5)
WBC: 14.3 10*3/uL — ABNORMAL HIGH (ref 4.0–10.5)

## 2016-07-04 MED ORDER — MEROPENEM 1 G IV SOLR
1.0000 g | INTRAVENOUS | Status: DC
  Administered 2016-07-04 – 2016-07-08 (×5): 1 g via INTRAVENOUS
  Filled 2016-07-04 (×6): qty 1

## 2016-07-04 NOTE — Progress Notes (Signed)
PROGRESS NOTE  Tracy [name]  ZOX:[id number] DOB: Apr 16, 1926 DOA: 06/30/2016 PCP: Oliver Barre, MD Outpatient Specialists:  Subjective: Seen with her son at bedside, somnolent, received Haldol at 2 AM on morphine at midnight and Dilaudid at 8:33 AM. We'll try to decrease sedative medications and the timing as well.  Brief Narrative:  Tracy [name] is a 83 y.o. female with medical history significant of dementia, history of CVA and chronic atrial fibrillation, not anticoagulated secondary to previous GI bleed. Patient came into the hospital because of abdominal pain, nausea and vomiting. All of the history obtained from daughter at bedside, patient started to have some abdominal pain and nausea about a week ago, started to have vomiting this morning and worsening abdominal pain so she came into the hospital for further evaluation. CT scan was done and showed acute cholecystitis, general surgery was called initially, recommended internal medicine admission secondary to comorbid conditions including a fib, and probably patient will not be a good surgical candidate.  Assessment & Plan:   Principal Problem:   Sepsis (HCC) Active Problems:   Atrial fibrillation with RVR (HCC)   AKI (acute kidney injury) (HCC)   Abdominal pain   Acute cholecystitis   Protein-calorie malnutrition, severe   Sepsis -Patient presented to the hospital with HR of 150, WBC of 20 and presence of infection (acute cholecystitis). -There is evidence of hypoperfusion with elevated lactic acid of 2.3. -IV antibiotics started and patient given IV fluids in the ED.  Acute cholecystitis -Presented with nausea/vomiting and abdominal pain, CT scan showed acute cholecystitis. -Gen. surgery consulted, recommended nonsurgical approach as patient has multiple comorbid conditions. -Continue Zosyn, HIDA scan is positive, IR consulted for percutaneous cholecystostomy.  Somnolence -This is likely secondary to pain  medications, acute illness and dehydration. -We'll decrease sedative medications, continue antibiotics and IV fluids  AKI -Baseline creatinine is 0.5 from 2/13, presented with creatinine of 1.98. -Continue IV fluid at 100 mL/hour, check BMP in a.m.  Hypokalemia -Potassium at 3.2, ordered oral supplements. -This is resolved with IV supplementation.  Atrial fibrillation with RVR -Reportedly patient vomited this morning including her medications, restarted Cardizem and Toprol-XL. -CHA2DS2-VASc is likely greater than 6, female, HTN, 2+ CVA and 2+ age. -She is on low-dose aspirin, anticoagulation is contraindicated secondary to recent significant GI bleed.  Dementia without behavioral disturbances -She is on Zyprexa at night, restarted. -Discussed with daughter at bedside she will likely to develop sundowning and acute delirium at night. -Added as needed Haldol at night.  Severe protein energy malnutrition -Currently nothing by mouth we'll start on clear liquids for today and it has diet as tolerated.  DVT prophylaxis:  Code Status: DNR Family Communication:  Disposition Plan:  Diet: Diet clear liquid Room service appropriate? Yes; Fluid consistency: Thin  Consultants:   Gen. surgery  Procedures:   None  Antimicrobials:   Zosyn   Objective: Vitals:   07/03/16 0508 07/03/16 1700 07/03/16 2000 07/04/16 0440  BP: (!) 124/58 114/78 107/65 137/86  Pulse: (!) 109 (!) 113 (!) 101 (!) 103  Resp: Temp: 98.4 F (36.9 C)  99 F (37.2 C) 98.7 F (37.1 C)  TempSrc: Axillary  Axillary Axillary  SpO2: 99%  95% 95%  Weight: 49.2 kg (108 lb 8 oz)     Height:        Intake/Output Summary (Last 24 hours) at 07/04/16 1127 Last data filed at 07/03/16 2028  Gross per 24 hour  Intake  5 ml  Output               20 ml  Net              -15 ml   Filed Weights   06/30/16 2058 07/01/16 1114 07/03/16 0508  Weight: 41.9 kg (92 lb 6.4 oz) 41.7 kg (92 lb)  49.2 kg (108 lb 8 oz)    Examination: General exam: Appears calm and comfortable  Respiratory system: Clear to auscultation. Respiratory effort normal. Cardiovascular system: S1 & S2 heard, RRR. No JVD, murmurs, rubs, gallops or clicks. No pedal edema. Gastrointestinal system: Abdomen is nondistended, soft and nontender. No organomegaly or masses felt. Normal bowel sounds heard. Central nervous system: Alert and oriented. No focal neurological deficits. Extremities: Symmetric 5 x 5 power. Skin: No rashes, lesions or ulcers Psychiatry: Judgement and insight appear normal. Mood & affect appropriate.   Data Reviewed: I have personally reviewed following labs and imaging studies  CBC:  Recent Labs Lab 06/30/16 2207 07/01/16 0354 07/02/16 0310 07/03/16 0203 07/04/16 0214  WBC 17.2* 16.1* 12.8* 15.2* 14.3*  HGB 8.5* 8.9* 9.0* 8.5* 8.5*  HCT 26.0* 26.6* 28.1* 26.6* 26.4*  MCV 84.1 84.4 85.4 85.3 86.3  PLT 507* 485* 523* 438* 332   Basic Metabolic Panel:  Recent Labs Lab 06/30/16 1210 07/01/16 0354 07/02/16 0310 07/03/16 0203 07/04/16 0214  NA 144 151* 147* 142 141  K 3.2* 2.9* 4.6 4.6 4.2  CL 102 110 113* 110 110  CO2 20* 23  GLUCOSE 66 102* 142* 157* 132*  BUN 21* 25* 27* 26* 23*  CREATININE 1.98* 2.10* 2.22* 2.21* 1.97*  CALCIUM 8.3* 7.8* 7.7* 7.6* 7.9*   GFR: Estimated Creatinine Clearance: 13.7 mL/min (A) (by C-G formula based on SCr of 1.97 mg/dL (H)). Liver Function Tests:  Recent Labs Lab 06/30/16 1210  AST 57*  ALT 24  ALKPHOS 70  BILITOT 2.5*  PROT 5.5*  ALBUMIN 1.9*    Recent Labs Lab 06/30/16 1210  LIPASE <10*   No results for input(s): AMMONIA in the last 168 hours. Coagulation Profile:  Recent Labs Lab 06/30/16 2207 07/02/16 1043  INR 1.73 1.40   Cardiac Enzymes: No results for input(s): CKTOTAL, CKMB, CKMBINDEX, TROPONINI in the last 168 hours. BNP (last 3 results) No results for input(s): PROBNP in the last 8760  hours. HbA1C: No results for input(s): HGBA1C in the last 72 hours. CBG:  Recent Labs Lab 07/01/16 0309  GLUCAP 117*   Lipid Profile: No results for input(s): CHOL, HDL, LDLCALC, TRIG, CHOLHDL, LDLDIRECT in the last 72 hours. Thyroid Function Tests: No results for input(s): TSH, T4TOTAL, FREET4, T3FREE, THYROIDAB in the last 72 hours. Anemia Panel: No results for input(s): VITAMINB12, FOLATE, FERRITIN, TIBC, IRON, RETICCTPCT in the last 72 hours. Urine analysis:    Component Value Date/Time   COLORURINE AMBER (A) 06/30/2016 1638   APPEARANCEUR CLEAR 06/30/2016 1638   LABSPEC 1.020 06/30/2016 1638   PHURINE 5.0 06/30/2016 1638   GLUCOSEU NEGATIVE 06/30/2016 1638   GLUCOSEU NEGATIVE 05/19/2016 0959   HGBUR NEGATIVE 06/30/2016 1638   BILIRUBINUR NEGATIVE 06/30/2016 1638   KETONESUR NEGATIVE 06/30/2016 1638   PROTEINUR 30 (A) 06/30/2016 1638   UROBILINOGEN 2.0 (A) 05/19/2016 0959   NITRITE NEGATIVE 06/30/2016 1638   LEUKOCYTESUR NEGATIVE 06/30/2016 1638   Sepsis Labs: (procalcitonin:4,lacticidven:4)  ) Recent Results (from the past 240 hour(s))  Aerobic/Anaerobic Culture (surgical/deep wound)     Status: None (Preliminary result)   Collection  Time: 07/02/16  5:19 PM  Result Value Ref Range Status   Specimen Description DRAINAGE BILE  Final   Special Requests NONE  Final   Gram Stain   Final    ABUNDANT WBC PRESENT, PREDOMINANTLY PMN NO ORGANISMS SEEN    Culture CULTURE REINCUBATED FOR BETTER GROWTH  Final   Report Status PENDING  Incomplete     Invalid input(s): PROCALCITONIN, LACTICACIDVEN   Radiology Studies: Ir Perc Cholecystostomy  Result Date: 07/02/2016 CLINICAL DATA:  Acute cholecystitis, non surgical candidate EXAM: PERCUTANEOUS CHOLECYSTOSTOMY TUBE PLACEMENT WITH ULTRASOUND AND FLUOROSCOPIC GUIDANCE FLUOROSCOPY TIME:  0.6 minute (24 uGym2 DAP) TECHNIQUE: The procedure, risks (including but not limited to bleeding, infection, organ damage ),  benefits, and alternatives were explained to the daughter. Questions regarding the procedure were encouraged and answered. The daughter understands and consents to the procedure. Survey ultrasound of the abdomen was performed and an appropriate skin entry site was identified. Skin site was marked, prepped with chlorhexidine, and draped in usual sterile fashion, and infiltrated locally with 1% lidocaine. Intravenous Fentanyl and Versed were administered as conscious sedation during continuous monitoring of the patient's level of consciousness and physiological / cardiorespiratory status by the radiology RN, with a total moderate sedation time of 10 minutes. Under real-time ultrasound guidance, gallbladder was accessed using a transhepatic approach with a 21-gauge needle. Ultrasound image documentation was saved. Bile returned through the hub. Needle was exchanged over a 018 guidewire for transitional dilator which allowed placement of 035 J wire. Over this, a 10.2 French pigtail catheter was advanced and formed centrally in the gallbladder lumen. Small contrast injection confirmed appropriate position. Catheter secured externally with 0 Prolene suture and placed external drain bag. Patient tolerated the procedure well. COMPLICATIONS: COMPLICATIONS none IMPRESSION: 1. Technically successful percutaneous cholecystostomy tube placement with ultrasound and fluoroscopic guidance. Electronically Signed   By: Corlis Leak M.D.   On: 07/02/2016 16:58        Scheduled Meds: . aspirin EC  81 mg Oral Daily  . diltiazem  180 mg Oral Daily  . heparin  5,000 Units Subcutaneous Q8H  . metoprolol tartrate  12.5 mg Oral BID  . multivitamin  1 tablet Oral BID  . multivitamin with minerals  1 tablet Oral Daily  . OLANZapine  2.5 mg Oral QHS  . pentoxifylline  400 mg Oral TID WC  . piperacillin-tazobactam (ZOSYN)  IV  2.25 g Intravenous Q8H  . sodium chloride flush  3 mL Intravenous Q12H   Continuous Infusions: .  dextrose 5 % and 0.45% NaCl 1,000 mL infusion 100 mL/hr at 07/04/16 0037     LOS: 4 days    Time spent: 35 minutes    Dulcinea Kinser A, MD [facility] Pager [phone]  If 7PM-7AM, please contact night-coverage [email or web address] Password [facility] 07/04/2016, 11:27 AM

## 2016-07-04 NOTE — Progress Notes (Signed)
Pharmacy Antibiotic Note  Tracy [name] is a 83 y.o. female admitted on 06/30/2016 with intraabdominal infection.  Pharmacy has been consulted for meropenem dosing.  Plan: Meropenem 1G IV q24 hours F/U LOT and C/S  Height:  (149.9 cm) Weight: 108 lb 8 oz (49.2 kg) IBW/kg (Calculated) : 43.2  Temp (24hrs), Avg:98.9 F (37.2 C), Min:98.7 F (37.1 C), Max:99 F (37.2 C)   Recent Labs Lab 06/30/16 1210 06/30/16 1221 06/30/16 1541 06/30/16 2207 07/01/16 0354 07/02/16 0310 07/03/16 0203 07/04/16 0214  WBC 20.0*  --   --  17.2* 16.1* 12.8* 15.2* 14.3*  CREATININE 1.98*  --   --   --  2.10* 2.22* 2.21* 1.97*  LATICACIDVEN  --  2.31* 2.23* 2.1*  --   --   --   --     Estimated Creatinine Clearance: 13.7 mL/min (A) (by C-G formula based on SCr of 1.97 mg/dL (H)).    No Known Allergies  Antimicrobials this admission: Zosyn 3/28>>4/1 Meropenem 4/1>>  Thank you for allowing pharmacy to be a part of this patient's care.  Tracy [name] 07/04/2016 1:49 PM

## 2016-07-04 NOTE — Progress Notes (Signed)
SLP Cancellation Note  Patient Details Name: Tracy [name] MRN: [id number] DOB: June 09, 1926   Cancelled treatment:       Reason Eval/Treat Not Completed: Fatigue/lethargy limiting ability to participate . Attempted x2, pt lethargic per RN. Will f/u next day.  Rondel Baton, [place] CF-SLP Speech-Language Pathologist [phone]  Arlana Lindau 07/04/2016, 4:07 PM

## 2016-07-04 NOTE — Progress Notes (Signed)
CC: unable to obtain Subjective: Seems more comfortable today  Objective: Vital signs in last 24 hours: Temp:  [98.7 F (37.1 C)-99 F (37.2 C)] 98.7 F (37.1 C) (04/01 0440) Pulse Rate:  [101-113] 103 (04/01 0440) Resp:  [18-20] 20 (04/01 0440) BP: (107-137)/(65-86) 137/86 (04/01 0440) SpO2:  [95 %] 95 % (04/01 0440) Last BM Date: 07/02/16  Intake/Output from previous day: 03/31 0701 - 04/01 0700 In: 5  Out: 20 [Drains:20] Intake/Output this shift: No intake/output data recorded.  In mittens; Doesn't follow commands Moves all extremities Trachea midline, no thyroid masses Soft, tenderness seems much improved Drain intact - very light bile No edema  Lab Results:   Recent Labs  07/03/16 0203 07/04/16 0214  WBC 15.2* 14.3*  HGB 8.5* 8.5*  HCT 26.6* 26.4*  PLT 438* 332   BMET  Recent Labs  07/03/16 0203 07/04/16 0214  NA 142 141  K 4.6 4.2  CL 110 110  CO2 20* 23  GLUCOSE 157* 132*  BUN 26* 23*  CREATININE 2.21* 1.97*  CALCIUM 7.6* 7.9*   PT/INR  Recent Labs  07/02/16 1043  LABPROT 17.3*  INR 1.40   ABG No results for input(s): PHART, HCO3 in the last 72 hours.  Invalid input(s): PCO2, PO2  Studies/Results: Ir Perc Cholecystostomy  Result Date: 07/02/2016 CLINICAL DATA:  Acute cholecystitis, non surgical candidate EXAM: PERCUTANEOUS CHOLECYSTOSTOMY TUBE PLACEMENT WITH ULTRASOUND AND FLUOROSCOPIC GUIDANCE FLUOROSCOPY TIME:  0.6 minute (24 uGym2 DAP) TECHNIQUE: The procedure, risks (including but not limited to bleeding, infection, organ damage ), benefits, and alternatives were explained to the daughter. Questions regarding the procedure were encouraged and answered. The daughter understands and consents to the procedure. Survey ultrasound of the abdomen was performed and an appropriate skin entry site was identified. Skin site was marked, prepped with chlorhexidine, and draped in usual sterile fashion, and infiltrated locally with 1%  lidocaine. Intravenous Fentanyl and Versed were administered as conscious sedation during continuous monitoring of the patient's level of consciousness and physiological / cardiorespiratory status by the radiology RN, with a total moderate sedation time of 10 minutes. Under real-time ultrasound guidance, gallbladder was accessed using a transhepatic approach with a 21-gauge needle. Ultrasound image documentation was saved. Bile returned through the hub. Needle was exchanged over a 018 guidewire for transitional dilator which allowed placement of 035 J wire. Over this, a 10.2 French pigtail catheter was advanced and formed centrally in the gallbladder lumen. Small contrast injection confirmed appropriate position. Catheter secured externally with 0 Prolene suture and placed external drain bag. Patient tolerated the procedure well. COMPLICATIONS: COMPLICATIONS none IMPRESSION: 1. Technically successful percutaneous cholecystostomy tube placement with ultrasound and fluoroscopic guidance. Electronically Signed   By: Corlis Leak M.D.   On: 07/02/2016 16:58    Anti-infectives: Anti-infectives    Start     Dose/Rate Route Frequency Ordered Stop   07/01/16 0200  piperacillin-tazobactam (ZOSYN) IVPB 2.25 g     2.25 g 100 mL/hr over 30 Minutes Intravenous Every 8 hours 06/30/16 1714     06/30/16 1730  piperacillin-tazobactam (ZOSYN) IVPB 3.375 g     3.375 g 100 mL/hr over 30 Minutes Intravenous  Once 06/30/16 1722 06/30/16 1923   06/30/16 1715  piperacillin-tazobactam (ZOSYN) IVPB 3.375 g  Status:  Discontinued     3.375 g 100 mL/hr over 30 Minutes Intravenous  Once 06/30/16 1711 06/30/16 1721      Assessment/Plan: Cholecystitis s/p IR percutaneous cholecystostomy tube 3/30  Cont abx f/u cultures Can have diet  but not sure of mental status - consider speech consult?  Berna Bue MD General, Bariatric, & Minimally Invasive Surgery [facility] Surgery, [place]   LOS: 4 days    Berna Bue 07/04/2016

## 2016-07-05 ENCOUNTER — Inpatient Hospital Stay (HOSPITAL_COMMUNITY): Payer: Medicare Other

## 2016-07-05 DIAGNOSIS — R0902 Hypoxemia: Secondary | ICD-10-CM

## 2016-07-05 LAB — BASIC METABOLIC PANEL
Anion gap: 5 (ref 5–15)
BUN: 23 mg/dL — ABNORMAL HIGH (ref 6–20)
CO2: 23 mmol/L (ref 22–32)
CREATININE: 1.97 mg/dL — AB (ref 0.44–1.00)
Calcium: 7.7 mg/dL — ABNORMAL LOW (ref 8.9–10.3)
Chloride: 112 mmol/L — ABNORMAL HIGH (ref 101–111)
GFR, EST AFRICAN AMERICAN: 25 mL/min — AB (ref >60)
GFR, EST NON AFRICAN AMERICAN: 22 mL/min — AB (ref >60)
Glucose, Bld: 118 mg/dL — ABNORMAL HIGH (ref 65–99)
Potassium: 4.3 mmol/L (ref 3.5–5.1)
SODIUM: 140 mmol/L (ref 135–145)

## 2016-07-05 LAB — CBC
HCT: 25.4 % — ABNORMAL LOW (ref 36.0–46.0)
Hemoglobin: 8.1 g/dL — ABNORMAL LOW (ref 12.0–15.0)
MCH: 27.6 pg (ref 26.0–34.0)
MCHC: 31.9 g/dL (ref 30.0–36.0)
MCV: 86.7 fL (ref 78.0–100.0)
PLATELETS: 303 10*3/uL (ref 150–400)
RBC: 2.93 MIL/uL — AB (ref 3.87–5.11)
RDW: 21.1 % — ABNORMAL HIGH (ref 11.5–15.5)
WBC: 17.5 10*3/uL — AB (ref 4.0–10.5)

## 2016-07-05 LAB — BLOOD GAS, ARTERIAL
Acid-base deficit: 6 mmol/L — ABNORMAL HIGH (ref 0.0–2.0)
Bicarbonate: 18.8 mmol/L — ABNORMAL LOW (ref 20.0–28.0)
Drawn by: 275531
O2 CONTENT: 3 L/min
O2 Saturation: 90.8 %
Patient temperature: 98.6
pCO2 arterial: 36 mmHg (ref 32.0–48.0)
pH, Arterial: 7.338 — ABNORMAL LOW (ref 7.350–7.450)
pO2, Arterial: 67.1 mmHg — ABNORMAL LOW (ref 83.0–108.0)

## 2016-07-05 MED ORDER — FUROSEMIDE 10 MG/ML IJ SOLN
40.0000 mg | Freq: Once | INTRAMUSCULAR | Status: AC
  Administered 2016-07-05: 40 mg via INTRAVENOUS

## 2016-07-05 MED ORDER — HALOPERIDOL LACTATE 5 MG/ML IJ SOLN: 2.0000 mg | Freq: Four times a day (QID) | INTRAMUSCULAR | Status: AC

## 2016-07-05 NOTE — Progress Notes (Signed)
  Speech Language Pathology Treatment: Dysphagia  Patient Details Name: Tracy [name] MRN: [id number] DOB: 1927/04/05 Today's Date: 07/05/2016 Time: 4098-1191 SLP Time Calculation (min) (ACUTE ONLY): 10 min  Assessment / Plan / Recommendation Clinical Impression  Ongoing f/u for dysphagia management.  Pt arousable but moaning in bed, unable to state name.  Provided with sips of juice from cup; unable to use straw.  No overt s/s of aspiration, but minimal intake - just unable to sustain arousal sufficiently to eat. Will follow for improved mentation and readiness for diet advancement.  HPI HPI: PHILICIA HEYNE a 83 y.o.femalewith medical history significant of dementia, CVA, DM2, diverticulosis, DVT, gastric ulcer, GERD, HTN and chronic atrial fibrillation, not anticoagulated secondary to previous GI bleed. Patient came into the hospital because of abdominal pain, nausea and vomiting. CT scan was done and showed acute cholecystitis. Pt also admitted with sepsis, AKI, hypokalemia, afib with RVR, malnutrition. S/p percutaneous cholecystostomy placement. Seen for swallowing evaluation during prior admission, found to have grossly intact oropharyngeal swallowing fx, regular diet with thin liquids recommended (07/05/14). Currently on clear liquids, to be advanced by MD as tolerated.       SLP Plan  Continue with current plan of care       Recommendations  Diet recommendations: Thin liquid Liquids provided via: Cup Medication Administration: Crushed with puree Supervision: Staff to assist with self feeding Compensations: Slow rate;Small sips/bites                Oral Care Recommendations: Oral care QID SLP Visit Diagnosis: Dysphagia, unspecified (R13.10) Plan: Continue with current plan of care       GO                [name] 07/05/2016, 3:45 PM

## 2016-07-05 NOTE — Progress Notes (Signed)
Patient ID: Tracy [name], female   DOB: July 04, 1926, 83 y.o.   MRN: [id number]    Referring Physician(s): Dr. Phylliss Blakes  Supervising Physician: Gilmer Mor  Patient Status: [facility] - In-pt  Chief Complaint: Acute cholecystitis  Subjective: Patient confused.    Allergies: Patient has no known allergies.  Medications: Prior to Admission medications   Medication Sig Start Date End Date Taking? Authorizing Provider  aspirin 81 MG EC tablet 1 TABLET DAILY WITH FOOD 06/17/16  Yes Corwin Levins, MD  atorvastatin (LIPITOR) 10 MG tablet 1/2 tab by mouth daily 06/17/16  Yes Corwin Levins, MD  diltiazem (CARDIZEM CD) 180 MG 24 hr capsule Take 1 capsule (180 mg total) by mouth daily. 05/18/16  Yes Bonna Gains Nche, NP  donepezil (ARICEPT) 5 MG tablet Take 1 tablet (5 mg total) by mouth at bedtime. 03/22/16  Yes Costin Otelia Sergeant, MD  fenofibrate micronized (LOFIBRA) 134 MG capsule TAKE 1 CAPSULE (134 MG TOTAL) BY MOUTH DAILY. 05/18/16  Yes Anne Ng, NP  ferrous sulfate 325 (65 FE) MG tablet 1 twice a day 05/18/16  Yes Bonna Gains Nche, NP  hydrochlorothiazide (MICROZIDE) 12.5 MG capsule Take 1 capsule (12.5 mg total) by mouth daily. 06/01/16  Yes Corwin Levins, MD  meclizine (ANTIVERT) 12.5 MG tablet Take 1 tablet (12.5 mg total) by mouth 2 (two) times daily as needed for dizziness (do not use for more than 3days). 05/18/16  Yes Bonna Gains Nche, NP  metoprolol tartrate (LOPRESSOR) 25 MG tablet Take 0.5 tablets (12.5 mg total) by mouth 2 (two) times daily. 05/18/16  Yes Anne Ng, NP  Multiple Vitamin (MULTIVITAMIN WITH MINERALS) TABS tablet Take 1 tablet by mouth daily.   Yes Historical Provider, MD  Multiple Vitamins-Minerals (PRESERVISION AREDS 2) CAPS Take 1 capsule by mouth 2 (two) times daily.   Yes Historical Provider, MD  OLANZapine (ZYPREXA) 2.5 MG tablet Take 2.5 mg by mouth daily. 04/10/16  Yes Historical Provider, MD  pantoprazole (PROTONIX) 40 MG tablet Take 1  tablet (40 mg total) by mouth daily. 06/16/16  Yes Corwin Levins, MD  pentoxifylline (TRENTAL) 400 MG CR tablet TAKE 1 TABLET (400 MG TOTAL) BY MOUTH DAILY. 05/18/16  Yes Bonna Gains Nche, NP  sennosides-docusate sodium (SENOKOT-S) 8.6-50 MG tablet Take 1 tablet by mouth daily. 06/16/16  Yes Corwin Levins, MD  traMADol (ULTRAM) 50 MG tablet Take 1 tablet (50 mg total) by mouth every 8 (eight) hours as needed. 06/16/16  Yes Corwin Levins, MD    Vital Signs: BP 92/70 (BP Location: Right Arm)   Pulse 89   Temp 97.8 F (36.6 C) (Axillary)   Resp 20   Ht  (1.499 m)   Wt 108 lb 8 oz (49.2 kg)   SpO2 (!) 89%   BMI 21.91 kg/m   Physical Exam: abd: soft, minimally tender in RUQ, drain with minimal output.  Drain flushes well.  Imaging: Nm Hepato W/eject Fract  Result Date: 07/01/2016 CLINICAL DATA:  Abdominal pain. EXAM: NUCLEAR MEDICINE HEPATOBILIARY IMAGING TECHNIQUE: Sequential images of the abdomen were obtained out to 60 minutes following intravenous administration of radiopharmaceutical. RADIOPHARMACEUTICALS:  5.2 millicuries mCi Tc-38m Choletec IV, 1.5 mg of morphine. COMPARISON:  CT 06/30/2016 . FINDINGS: Liver, biliary system, bowel visualize normally. The gallbladder does not visualize. Following administration of morphine the gallbladder also fails to visualize. IMPRESSION: Nonvisualization of the gallbladder suggesting cholecystitis . Electronically Signed   By: Maisie Fus  Register   On: 07/01/2016  16:57   Ir Perc Cholecystostomy  Result Date: 07/02/2016 CLINICAL DATA:  Acute cholecystitis, non surgical candidate EXAM: PERCUTANEOUS CHOLECYSTOSTOMY TUBE PLACEMENT WITH ULTRASOUND AND FLUOROSCOPIC GUIDANCE FLUOROSCOPY TIME:  0.6 minute (24 uGym2 DAP) TECHNIQUE: The procedure, risks (including but not limited to bleeding, infection, organ damage ), benefits, and alternatives were explained to the daughter. Questions regarding the procedure were encouraged and answered. The daughter  understands and consents to the procedure. Survey ultrasound of the abdomen was performed and an appropriate skin entry site was identified. Skin site was marked, prepped with chlorhexidine, and draped in usual sterile fashion, and infiltrated locally with 1% lidocaine. Intravenous Fentanyl and Versed were administered as conscious sedation during continuous monitoring of the patient's level of consciousness and physiological / cardiorespiratory status by the radiology RN, with a total moderate sedation time of 10 minutes. Under real-time ultrasound guidance, gallbladder was accessed using a transhepatic approach with a 21-gauge needle. Ultrasound image documentation was saved. Bile returned through the hub. Needle was exchanged over a 018 guidewire for transitional dilator which allowed placement of 035 J wire. Over this, a 10.2 French pigtail catheter was advanced and formed centrally in the gallbladder lumen. Small contrast injection confirmed appropriate position. Catheter secured externally with 0 Prolene suture and placed external drain bag. Patient tolerated the procedure well. COMPLICATIONS: COMPLICATIONS none IMPRESSION: 1. Technically successful percutaneous cholecystostomy tube placement with ultrasound and fluoroscopic guidance. Electronically Signed   By: Corlis Leak M.D.   On: 07/02/2016 16:58   Dg Chest Port 1 View  Result Date: 07/05/2016 CLINICAL DATA:  Shortness of breath. EXAM: PORTABLE CHEST 1 VIEW COMPARISON:  06/17/2016 FINDINGS: Bilateral calcified pleural plaques. Small right and moderate left pleural effusion. Bilateral interstitial thickening. No pneumothorax. Stable cardiomegaly. Thoracic aortic atherosclerosis. No acute osseous abnormality. IMPRESSION: Findings most concerning for CHF. Electronically Signed   By: Elige Ko   On: 07/05/2016 08:49    Labs:  CBC:  Recent Labs  07/02/16 0310 07/03/16 0203 07/04/16 0214 07/05/16 0226  WBC 12.8* 15.2* 14.3* 17.5*  HGB 9.0*  8.5* 8.5* 8.1*  HCT 28.1* 26.6* 26.4* 25.4*  PLT 523* 438* 332 303    COAGS:  Recent Labs  04/19/16 1816 04/21/16 1410 06/30/16 2207 07/02/16 1043  INR 1.33 1.10 1.73 1.40    BMP:  Recent Labs  07/02/16 0310 07/03/16 0203 07/04/16 0214 07/05/16 0226  NA 147* 142 141 140  K 4.6 4.6 4.2 4.3  CL 113* 110 110 112*  CO2 24 20* 23 23  GLUCOSE 142* 157* 132* 118*  BUN 27* 26* 23* 23*  CALCIUM 7.7* 7.6* 7.9* 7.7*  CREATININE 2.22* 2.21* 1.97* 1.97*  GFRNONAA 19* 19* 22* 22*  GFRAA 22* 22* 25* 25*    LIVER FUNCTION TESTS:  Recent Labs  03/12/16 0548 03/18/16 0415 04/19/16 0112 06/30/16 1210  BILITOT 0.6 0.5 0.2* 2.5*  AST 46* 29 21 57*  ALT 34 ALKPHOS 43 42 58 70  PROT 6.3* 5.2* 5.0* 5.5*  ALBUMIN 2.9* 2.3* 2.1* 1.9*    Assessment and Plan: 1. Acute cholecystitis, s/p perc chole drain placement -drain appears to be working well -cont drain placement for at least 6-8 weeks.  Surgical decision will be by the surgical team at that time regarding duration of drain placement vs surgical intervention.  Electronically Signed: Letha Cape 07/05/2016, 11:36 AM   I spent a total of 15 Minutes at the the patient's bedside AND on the patient's hospital floor or unit, greater  than 50% of which was counseling/coordinating care for acute cholecystitis

## 2016-07-05 NOTE — Progress Notes (Signed)
Patient ID: Tracy [name], female   DOB: 22-Apr-1926, 83 y.o.   MRN: [id number]  [facility] Surgery Progress Note     Subjective: CC- abdominal pain Patient confused and tired this morning. She does complain of abdominal pain. She had a BM yesterday, no n/v. Drain working well.  Antibiotics switched to merrem yesterday. WBC up to 17.5 today. Blood cultures pending.  Objective: Vital signs in last 24 hours: Temp:  [97.8 F (36.6 C)-98.8 F (37.1 C)] 97.8 F (36.6 C) (04/02 0500) Pulse Rate:  [77-100] 89 (04/02 0500) Resp:  [17-20] 20 (04/02 0500) BP: (74-112)/(42-70) 92/70 (04/02 0500) SpO2:  [89 %-92 %] 89 % (04/02 0500) Last BM Date: 07/04/16  Intake/Output from previous day: 04/01 0701 - 04/02 0700 In: 5  Out: 50 [Drains:50] Intake/Output this shift: No intake/output data recorded.  PE: Gen:  Alert, confused, appears uncomfortable, in mittens Card:  RRR Pulm:  CTAB, no W/R/R, effort normal Abd: Soft, NT/ND, +BS, no HSM, per chole tube in place with minimal serous/slightly bilious output in bag Ext:  No erythema, edema, or tenderness   Lab Results:   Recent Labs  07/04/16 0214 07/05/16 0226  WBC 14.3* 17.5*  HGB 8.5* 8.1*  HCT 26.4* 25.4*  PLT 332 303   BMET  Recent Labs  07/04/16 0214 07/05/16 0226  NA 141 140  K 4.2 4.3  CL 110 112*  CO2 23 23  GLUCOSE 132* 118*  BUN 23* 23*  CREATININE 1.97* 1.97*  CALCIUM 7.9* 7.7*   PT/INR  Recent Labs  07/02/16 1043  LABPROT 17.3*  INR 1.40   CMP     Component Value Date/Time   NA 140 07/05/2016 0226   K 4.3 07/05/2016 0226   CL 112 (H) 07/05/2016 0226   CO2 23 07/05/2016 0226   GLUCOSE 118 (H) 07/05/2016 0226   BUN 23 (H) 07/05/2016 0226   CREATININE 1.97 (H) 07/05/2016 0226   CALCIUM 7.7 (L) 07/05/2016 0226   CALCIUM 10.9 (H) 02/04/2016 2143   PROT 5.5 (L) 06/30/2016 1210   ALBUMIN 1.9 (L) 06/30/2016 1210   AST 57 (H) 06/30/2016 1210   ALT 24 06/30/2016 1210   ALKPHOS 70 06/30/2016  1210   BILITOT 2.5 (H) 06/30/2016 1210   GFRNONAA 22 (L) 07/05/2016 0226   GFRAA 25 (L) 07/05/2016 0226   Lipase     Component Value Date/Time   LIPASE <10 (L) 06/30/2016 1210       Studies/Results: Dg Chest Port 1 View  Result Date: 07/05/2016 CLINICAL DATA:  Shortness of breath. EXAM: PORTABLE CHEST 1 VIEW COMPARISON:  06/17/2016 FINDINGS: Bilateral calcified pleural plaques. Small right and moderate left pleural effusion. Bilateral interstitial thickening. No pneumothorax. Stable cardiomegaly. Thoracic aortic atherosclerosis. No acute osseous abnormality. IMPRESSION: Findings most concerning for CHF. Electronically Signed   By: Elige Ko   On: 07/05/2016 08:49    Anti-infectives: Anti-infectives    Start     Dose/Rate Route Frequency Ordered Stop   07/04/16 1400  meropenem (MERREM) 1 g in sodium chloride 0.9 % 100 mL IVPB     1 g 200 mL/hr over 30 Minutes Intravenous Every 24 hours 07/04/16 1348     07/01/16 0200  piperacillin-tazobactam (ZOSYN) IVPB 2.25 g  Status:  Discontinued     2.25 g 100 mL/hr over 30 Minutes Intravenous Every 8 hours 06/30/16 1714 07/04/16 1310   06/30/16 1730  piperacillin-tazobactam (ZOSYN) IVPB 3.375 g     3.375 g 100 mL/hr over 30 Minutes  Intravenous  Once 06/30/16 1722 06/30/16 1923   06/30/16 1715  piperacillin-tazobactam (ZOSYN) IVPB 3.375 g  Status:  Discontinued     3.375 g 100 mL/hr over 30 Minutes Intravenous  Once 06/30/16 1711 06/30/16 1721       Assessment/Plan Cholecystitis s/p IR percutaneous cholecystostomy tube 3/30 - drain with 50cc serous/slightly bilious output - culture: multiple organisms seen, none predominant, no anaerobes, report pending - WBC up to 17.5 today, per primary; CXR shows CHF, blood cultures pending  Sepsis AKI Dementia H/o CVA Atrial fibrillation with RVR  ID - zosyn 3/28>>4/1, merrem 4/1>> FEN - CLD, SLP consult pending  Plan - continue IV antibiotics and follow drain culture. Will plan to  continue per chole tube x6-8 weeks. Once discharged she will need to follow-up with drain clinic and with Dr. Fredricka Bonine in 6-8 weeks.   LOS: 5 days    Edson Snowball , [facility] Surgery 07/05/2016, 9:03 AM Pager: [phone] Consults: [phone] Mon-Fri 7:00 am-4:30 pm Sat-Sun 7:00 am-11:30 am

## 2016-07-05 NOTE — Progress Notes (Signed)
PROGRESS NOTE  Tracy [name]  ZOX:[id number] DOB: 03-30-1927 DOA: 06/30/2016 PCP: Oliver Barre, MD Outpatient Specialists:  Subjective: Continue to be somnolent easy to arouse but does not engage in conversation. Increased oxygen demands, CXR showed congestion, IV fluids discontinued and given Lasix.  Brief Narrative:  Tracy [name] is a 83 y.o. female with medical history significant of dementia, history of CVA and chronic atrial fibrillation, not anticoagulated secondary to previous GI bleed. Patient came into the hospital because of abdominal pain, nausea and vomiting. All of the history obtained from daughter at bedside, patient started to have some abdominal pain and nausea about a week ago, started to have vomiting this morning and worsening abdominal pain so she came into the hospital for further evaluation. CT scan was done and showed acute cholecystitis, general surgery was called initially, recommended internal medicine admission secondary to comorbid conditions including a fib, and probably patient will not be a good surgical candidate.  Assessment & Plan:   Principal Problem:   Sepsis (HCC) Active Problems:   Atrial fibrillation with RVR (HCC)   AKI (acute kidney injury) (HCC)   Abdominal pain   Acute cholecystitis   Protein-calorie malnutrition, severe   Sepsis -Patient presented to the hospital with HR of 150, WBC of 20 and presence of infection (acute cholecystitis). -There is evidence of hypoperfusion with elevated lactic acid of 2.3. -IV antibiotics started and patient given IV fluids in the ED.  Acute cholecystitis -Presented with nausea/vomiting and abdominal pain, CT scan showed acute cholecystitis. -Gen. surgery consulted, recommended nonsurgical approach as patient has multiple comorbid conditions. -HIDA scan is positive, IR placed percutaneous cholecystostomy. -Wasn't Zosyn, leukocytosis continues, this is switched to meropenem on 07/04/2016.  Acute  delirium -This is likely secondary to pain medications, acute illness and dehydration in the background of dementia. -Sedative medications discontinued, only she is on Zyprexa at night. -We'll discontinue pain medications, give Tylenol.  AKI -Baseline creatinine is 0.5 from 2/13, presented with creatinine of 1.98. -She was on IV fluids since admission, CXR now showing congestion, discontinued given 1 dose of Lasix.  Hypokalemia -Potassium at 3.2, ordered oral supplements. -This is resolved with IV supplementation.  Atrial fibrillation with RVR -Reportedly patient vomited this morning including her medications, restarted Cardizem and Toprol-XL. -CHA2DS2-VASc is likely greater than 6, female, HTN, 2+ CVA and 2+ age. -She is on low-dose aspirin, anticoagulation is contraindicated secondary to recent significant GI bleed.  Dementia without behavioral disturbances -She is on Zyprexa at night, restarted. Not able to take it -Discussed with daughter at bedside she is likely to develop sundowning and acute delirium at night. -Haldol for today, evaluating a.m.  Severe protein energy malnutrition -Currently nothing by mouth we'll start on clear liquids for today and it has diet as tolerated.  DVT prophylaxis:  Code Status: DNR Family Communication:  Disposition Plan:  Diet: Diet clear liquid Room service appropriate? Yes; Fluid consistency: Thin  Consultants:   Gen. surgery  Procedures:   None  Antimicrobials:   Zosyn   Objective: Vitals:   07/04/16 1739 07/04/16 2023 07/04/16 2258 07/05/16 0500  BP: (!) 92/42 (!) 96/49 112/61 92/70  Pulse:  100 77 89  Resp:  18  20  Temp:  98.8 F (37.1 C)  97.8 F (36.6 C)  TempSrc:  Axillary  Axillary  SpO2:  90%  (!) 89%  Weight:      Height:        Intake/Output Summary (Last 24 hours) at 07/05/16 1159 Last  data filed at 07/05/16 0820  Gross per 24 hour  Intake                5 ml  Output               50 ml  Net               -45 ml   Filed Weights   06/30/16 2058 07/01/16 1114 07/03/16 0508  Weight: 41.9 kg (92 lb 6.4 oz) 41.7 kg (92 lb) 49.2 kg (108 lb 8 oz)    Examination: General exam: Appears calm and comfortable  Respiratory system: Clear to auscultation. Respiratory effort normal. Cardiovascular system: S1 & S2 heard, RRR. No JVD, murmurs, rubs, gallops or clicks. No pedal edema. Gastrointestinal system: Abdomen is nondistended, soft and nontender. No organomegaly or masses felt. Normal bowel sounds heard. Central nervous system: Alert and oriented. No focal neurological deficits. Extremities: Symmetric 5 x 5 power. Skin: No rashes, lesions or ulcers Psychiatry: Judgement and insight appear normal. Mood & affect appropriate.   Data Reviewed: I have personally reviewed following labs and imaging studies  CBC:  Recent Labs Lab 07/01/16 0354 07/02/16 0310 07/03/16 0203 07/04/16 0214 07/05/16 0226  WBC 16.1* 12.8* 15.2* 14.3* 17.5*  HGB 8.9* 9.0* 8.5* 8.5* 8.1*  HCT 26.6* 28.1* 26.6* 26.4* 25.4*  MCV 84.4 85.4 85.3 86.3 86.7  PLT 485* 523* 438* 332 303   Basic Metabolic Panel:  Recent Labs Lab 07/01/16 0354 07/02/16 0310 07/03/16 0203 07/04/16 0214 07/05/16 0226  NA 151* 147* 142 141 140  K 2.9* 4.6 4.6 4.2 4.3  CL 110 113* 110 110 112*  CO2 24 24 20* 23 23  GLUCOSE 102* 142* 157* 132* 118*  BUN 25* 27* 26* 23* 23*  CREATININE 2.10* 2.22* 2.21* 1.97* 1.97*  CALCIUM 7.8* 7.7* 7.6* 7.9* 7.7*   GFR: Estimated Creatinine Clearance: 13.7 mL/min (A) (by C-G formula based on SCr of 1.97 mg/dL (H)). Liver Function Tests:  Recent Labs Lab 06/30/16 1210  AST 57*  ALT 24  ALKPHOS 70  BILITOT 2.5*  PROT 5.5*  ALBUMIN 1.9*    Recent Labs Lab 06/30/16 1210  LIPASE <10*   No results for input(s): AMMONIA in the last 168 hours. Coagulation Profile:  Recent Labs Lab 06/30/16 2207 07/02/16 1043  INR 1.73 1.40   Cardiac Enzymes: No results for input(s): CKTOTAL, CKMB,  CKMBINDEX, TROPONINI in the last 168 hours. BNP (last 3 results) No results for input(s): PROBNP in the last 8760 hours. HbA1C: No results for input(s): HGBA1C in the last 72 hours. CBG:  Recent Labs Lab 07/01/16 0309  GLUCAP 117*   Lipid Profile: No results for input(s): CHOL, HDL, LDLCALC, TRIG, CHOLHDL, LDLDIRECT in the last 72 hours. Thyroid Function Tests: No results for input(s): TSH, T4TOTAL, FREET4, T3FREE, THYROIDAB in the last 72 hours. Anemia Panel: No results for input(s): VITAMINB12, FOLATE, FERRITIN, TIBC, IRON, RETICCTPCT in the last 72 hours. Urine analysis:    Component Value Date/Time   COLORURINE AMBER (A) 06/30/2016 1638   APPEARANCEUR CLEAR 06/30/2016 1638   LABSPEC 1.020 06/30/2016 1638   PHURINE 5.0 06/30/2016 1638   GLUCOSEU NEGATIVE 06/30/2016 1638   GLUCOSEU NEGATIVE 05/19/2016 0959   HGBUR NEGATIVE 06/30/2016 1638   BILIRUBINUR NEGATIVE 06/30/2016 1638   KETONESUR NEGATIVE 06/30/2016 1638   PROTEINUR 30 (A) 06/30/2016 1638   UROBILINOGEN 2.0 (A) 05/19/2016 0959   NITRITE NEGATIVE 06/30/2016 1638   LEUKOCYTESUR NEGATIVE 06/30/2016 1638   Sepsis  Labs: (procalcitonin:4,lacticidven:4)  ) Recent Results (from the past 240 hour(s))  Aerobic/Anaerobic Culture (surgical/deep wound)     Status: Abnormal (Preliminary result)   Collection Time: 07/02/16  5:19 PM  Result Value Ref Range Status   Specimen Description DRAINAGE BILE  Final   Special Requests NONE  Final   Gram Stain   Final    ABUNDANT WBC PRESENT, PREDOMINANTLY PMN NO ORGANISMS SEEN    Culture (A)  Final    MULTIPLE ORGANISMS PRESENT, NONE PREDOMINANT NO ANAEROBES ISOLATED; CULTURE IN PROGRESS FOR 5 DAYS    Report Status PENDING  Incomplete  Culture, blood (Routine X 2) w Reflex to ID Panel     Status: None (Preliminary result)   Collection Time: 07/04/16  2:31 PM  Result Value Ref Range Status   Specimen Description BLOOD LEFT HAND  Final   Special Requests   Final     IN PEDIATRIC BOTTLE Blood Culture results may not be optimal due to an inadequate volume of blood received in culture bottles   Culture PENDING  Incomplete   Report Status PENDING  Incomplete  Culture, blood (Routine X 2) w Reflex to ID Panel     Status: None (Preliminary result)   Collection Time: 07/04/16  2:32 PM  Result Value Ref Range Status   Specimen Description BLOOD LEFT HAND  Final   Special Requests   Final    IN PEDIATRIC BOTTLE Blood Culture results may not be optimal due to an inadequate volume of blood received in culture bottles   Culture PENDING  Incomplete   Report Status PENDING  Incomplete     Invalid input(s): PROCALCITONIN, LACTICACIDVEN   Radiology Studies: Dg Chest Port 1 View  Result Date: 07/05/2016 CLINICAL DATA:  Shortness of breath. EXAM: PORTABLE CHEST 1 VIEW COMPARISON:  06/17/2016 FINDINGS: Bilateral calcified pleural plaques. Small right and moderate left pleural effusion. Bilateral interstitial thickening. No pneumothorax. Stable cardiomegaly. Thoracic aortic atherosclerosis. No acute osseous abnormality. IMPRESSION: Findings most concerning for CHF. Electronically Signed   By: Elige Ko   On: 07/05/2016 08:49        Scheduled Meds: . aspirin EC  81 mg Oral Daily  . diltiazem  180 mg Oral Daily  . furosemide  40 mg Intravenous Once  . heparin  5,000 Units Subcutaneous Q8H  . meropenem (MERREM) IV  1 g Intravenous Q24H  . metoprolol tartrate  12.5 mg Oral BID  . multivitamin  1 tablet Oral BID  . multivitamin with minerals  1 tablet Oral Daily  . OLANZapine  2.5 mg Oral QHS  . pentoxifylline  400 mg Oral TID WC  . sodium chloride flush  3 mL Intravenous Q12H   Continuous Infusions:    LOS: 5 days    Time spent: 35 minutes    Tracy [name] A, MD [facility] Pager [phone]  If 7PM-7AM, please contact night-coverage [email or web address] Password [email or web address] 07/05/2016, 11:59 AM

## 2016-07-05 NOTE — Progress Notes (Signed)
Pt. willl not take oral medications, due to not being alert enough to swallow meds.

## 2016-07-05 NOTE — Care Management Important Message (Signed)
Important Message  Patient Details  Name: Tracy [name] MRN: [id number] Date of Birth: [birth year]   Medicare Important Message Given:  Yes    Verla Bryngelson Stefan Church 07/05/2016, 4:07 PM

## 2016-07-06 ENCOUNTER — Encounter: Payer: Self-pay | Admitting: Vascular Surgery

## 2016-07-06 ENCOUNTER — Ambulatory Visit: Payer: Medicare Other | Admitting: Gastroenterology

## 2016-07-06 LAB — BASIC METABOLIC PANEL
Anion gap: 10 (ref 5–15)
BUN: 24 mg/dL — AB (ref 6–20)
CALCIUM: 8.2 mg/dL — AB (ref 8.9–10.3)
CO2: 18 mmol/L — ABNORMAL LOW (ref 22–32)
CREATININE: 1.81 mg/dL — AB (ref 0.44–1.00)
Chloride: 112 mmol/L — ABNORMAL HIGH (ref 101–111)
GFR calc Af Amer: 28 mL/min — ABNORMAL LOW (ref >60)
GFR, EST NON AFRICAN AMERICAN: 24 mL/min — AB (ref >60)
GLUCOSE: 76 mg/dL (ref 65–99)
Potassium: 3.9 mmol/L (ref 3.5–5.1)
Sodium: 140 mmol/L (ref 135–145)

## 2016-07-06 LAB — CBC
HCT: 28.9 % — ABNORMAL LOW (ref 36.0–46.0)
HEMOGLOBIN: 9.6 g/dL — AB (ref 12.0–15.0)
MCH: 27.7 pg (ref 26.0–34.0)
MCHC: 33.2 g/dL (ref 30.0–36.0)
MCV: 83.5 fL (ref 78.0–100.0)
Platelets: 331 10*3/uL (ref 150–400)
RBC: 3.46 MIL/uL — ABNORMAL LOW (ref 3.87–5.11)
RDW: 21.4 % — AB (ref 11.5–15.5)
WBC: 16.4 10*3/uL — ABNORMAL HIGH (ref 4.0–10.5)

## 2016-07-06 MED ORDER — MORPHINE SULFATE (PF) 2 MG/ML IV SOLN: 0.5000 mg | Freq: Once | INTRAVENOUS | Status: DC

## 2016-07-06 MED ORDER — FUROSEMIDE 10 MG/ML IJ SOLN
40.0000 mg | Freq: Once | INTRAMUSCULAR | Status: AC
  Administered 2016-07-06: 40 mg via INTRAVENOUS
  Filled 2016-07-06: qty 4

## 2016-07-06 MED ORDER — BOOST / RESOURCE BREEZE PO LIQD
1.0000 | Freq: Three times a day (TID) | ORAL | Status: DC
  Administered 2016-07-06 – 2016-07-08 (×2): 1 via ORAL

## 2016-07-06 NOTE — Evaluation (Signed)
Physical Therapy Evaluation Patient Details Name: Tracy [name] MRN: [id number] DOB: [birth year] Today's Date: 07/06/2016   History of Present Illness  Pt is a 83 y/o female with PMH of dementia, CVA, AF, HTN, DVT, and DM admitted with ongoing N/V now s/p percutaneous chole.    Clinical Impression  Pt lethargic and disoriented throughout session.  Poor participation 2/2 cognitive deficits vs pain.  Pt currently requiring total assist for all mobility, not safe for ambulation at time of evaluation.  Recommended SNF at d/c to decrease burden of care prior to returning home.     Follow Up Recommendations SNF;Supervision/Assistance - 24 hour    Equipment Recommendations       Recommendations for Other Services       Precautions / Restrictions Precautions Precautions: Fall Restrictions Weight Bearing Restrictions: No      Mobility  Bed Mobility Overal bed mobility: Needs Assistance Bed Mobility: Supine to Sit     Supine to sit: HOB elevated;Total assist     General bed mobility comments: Pt requires total assist to come to EOB and maintain sitting balance EOB  Transfers Overall transfer level: Needs assistance   Transfers: Squat Pivot Transfers;Sit to/from Stand Sit to Stand: Total assist   Squat pivot transfers: Total assist     General transfer comment: Pt requires total assist for forward weight shift, power up, and pivot.  No initiation noted from pt  Ambulation/Gait                Stairs            Wheelchair Mobility    Modified Rankin (Stroke Patients Only)       Balance Overall balance assessment: Needs assistance Sitting-balance support: Bilateral upper extremity supported;Feet supported Sitting balance-Leahy Scale: Zero Sitting balance - Comments: unable to maintain static sitting balance without assist, requires up to max when sitting EOB Postural control: Other (comment) (decreased balance strategies/protective responses)                                   Pertinent Vitals/Pain Pain Assessment: Faces Faces Pain Scale: Hurts little more Pain Intervention(s): Limited activity within patient's tolerance;Monitored during session;Repositioned    Home Living Family/patient expects to be discharged to:: Private residence Living Arrangements: Children Available Help at Discharge: Family;Available 24 hours/day Type of Home: House Home Access: Level entry     Home Layout: One level Home Equipment: Bedside commode;Walker - 4 wheels;Walker - 2 wheels;Shower seat;Wheelchair - manual Additional Comments: had HHPT and HHRN prior to admission    Prior Function Level of Independence: Needs assistance   Gait / Transfers Assistance Needed: Uses RW for ambulation. w/c for community ambulation.           Hand Dominance        Extremity/Trunk Assessment        Lower Extremity Assessment Lower Extremity Assessment: Generalized weakness;Difficult to assess due to impaired cognition       Communication   Communication: No difficulties;Other (comment) (pt with poor speech intelligibility)  Cognition Arousal/Alertness: Lethargic Behavior During Therapy: Flat affect Overall Cognitive Status: History of cognitive impairments - at baseline                                        General Comments      Exercises  Assessment/Plan    PT Assessment Patient needs continued PT services  PT Problem List Decreased strength;Decreased range of motion;Decreased activity tolerance;Decreased balance;Decreased mobility;Decreased coordination;Decreased cognition;Decreased knowledge of use of DME;Decreased safety awareness       PT Treatment Interventions DME instruction;Gait training;Functional mobility training;Therapeutic activities;Therapeutic exercise;Balance training;Neuromuscular re-education;Cognitive remediation;Patient/family education;Wheelchair mobility training    PT Goals (Current  goals can be found in the Care Plan section)  Acute Rehab PT Goals Patient Stated Goal: none stated PT Goal Formulation: Patient unable to participate in goal setting Time For Goal Achievement: 06-Aug-2016 Potential to Achieve Goals: Fair    Frequency Min 2X/week   Barriers to discharge Other (comment) (level of care)      Co-evaluation               End of Session Equipment Utilized During Treatment: Oxygen Activity Tolerance: Patient limited by fatigue;Patient limited by pain Patient left: in chair;with call bell/phone within reach;with chair alarm set Nurse Communication: Mobility status PT Visit Diagnosis: Unsteadiness on feet (R26.81);Muscle weakness (generalized) (M62.81);Difficulty in walking, not elsewhere classified (R26.2)    Time: 1610-9604 PT Time Calculation (min) (ACUTE ONLY): 20 min   Charges:   PT Evaluation $PT Eval Low Complexity: 1 Procedure     PT G Codes:        Azel Gumina Penven-Crew, PT, DPT 07/06/16 4:03 PM

## 2016-07-06 NOTE — Progress Notes (Signed)
PROGRESS NOTE  Tracy [name]  [name]:[id number] DOB: 1927-01-02 DOA: 06/30/2016 PCP: Oliver Barre, MD Outpatient Specialists:  Subjective: More awake and alert today, hopefully she'll be able to eat. Discontinued all sedative medications except Zyprexa at night  Brief Narrative:  Tracy [name] is a 83 y.o. female with medical history significant of dementia, history of CVA and chronic atrial fibrillation, not anticoagulated secondary to previous GI bleed. Patient came into the hospital because of abdominal pain, nausea and vomiting. HIDA scan showed acute cholecystitis, general surgery recommended cholecystostomy as she is not a surgical candidate, cholecystostomy done on March 30, she struggling with acute delirium since that, on 4/3 the first day she is waking up and started oral intake. Get PT/OT, she will likely need SNF in 2 days.  Assessment & Plan:   Principal Problem:   Sepsis (HCC) Active Problems:   Atrial fibrillation with RVR (HCC)   AKI (acute kidney injury) (HCC)   Abdominal pain   Acute cholecystitis   Protein-calorie malnutrition, severe   Sepsis -Patient presented to the hospital with HR of 150, WBC of 20 and presence of infection (acute cholecystitis). -There is evidence of hypoperfusion with elevated lactic acid of 2.3. -Sepsis physiology resolved, patient currently on meropenem as monotherapy for acute cholecystitis.  Acute cholecystitis -Presented with nausea/vomiting and abdominal pain, CT scan showed acute cholecystitis. -Gen. surgery consulted, recommended nonsurgical approach as patient has multiple comorbid conditions. -HIDA scan is positive, IR placed percutaneous cholecystostomy. -Was on Zosyn switched to meropenem on 4/2 because of persistent looks cytosis.  Acute delirium -This is likely secondary to pain medications, acute illness and dehydration in the background of dementia. -Sedative medications discontinued, only she is on Zyprexa at  night. -We'll discontinue pain medications, give Tylenol.  AKI -Baseline creatinine is 0.5 from 2/13, presented with creatinine of 1.98. -She was on IV fluids since admission, CXR now showing congestion, discontinued given 1 dose of Lasix.  Hypokalemia -Potassium at 3.2, ordered oral supplements. -This is resolved with IV supplementation.  Atrial fibrillation with RVR -Reportedly patient vomited this morning including her medications, restarted Cardizem and Toprol-XL. -CHA2DS2-VASc is likely greater than 6, female, HTN, 2+ CVA and 2+ age. -She is on low-dose aspirin, anticoagulation is contraindicated secondary to recent significant GI bleed.  Dementia without behavioral disturbances -She is on Zyprexa at night, restarted. Not able to take it -Discussed with daughter at bedside she is likely to develop sundowning and acute delirium at night.  Severe protein energy malnutrition -Currently nothing by mouth we'll start on clear liquids for today and it has diet as tolerated.   DVT prophylaxis: SubQ Heparin Code Status: DNR Family Communication:  Disposition Plan:  Diet: Diet clear liquid Room service appropriate? Yes; Fluid consistency: Thin  Consultants:   Gen. surgery  Procedures:   None  Antimicrobials:   Zosyn   Objective: Vitals:   07/05/16 1500 07/05/16 2132 07/06/16 0448 07/06/16 1005  BP: 102/74 128/90 (!) 126/100 133/85  Pulse: 92 (!) 58 (!) 104 (!) 101  Resp: Temp: 98 F (36.7 C) 98.1 F (36.7 C) 98.2 F (36.8 C)   TempSrc: Axillary Axillary Oral   SpO2: 94% 91% 99%   Weight:      Height:        Intake/Output Summary (Last 24 hours) at 07/06/16 1145 Last data filed at 07/06/16 0900  Gross per 24 hour  Intake  10 ml  Output                0 ml  Net               10 ml   Filed Weights   06/30/16 2058 07/01/16 1114 07/03/16 0508  Weight: 41.9 kg (92 lb 6.4 oz) 41.7 kg (92 lb) 49.2 kg (108 lb 8 oz)     Examination: General exam: Appears calm and comfortable  Respiratory system: Clear to auscultation. Respiratory effort normal. Cardiovascular system: S1 & S2 heard, RRR. No JVD, murmurs, rubs, gallops or clicks. No pedal edema. Gastrointestinal system: Abdomen is nondistended, soft and nontender. No organomegaly or masses felt. Normal bowel sounds heard. Central nervous system: Alert and oriented. No focal neurological deficits. Extremities: Symmetric 5 x 5 power. Skin: No rashes, lesions or ulcers Psychiatry: Judgement and insight appear normal. Mood & affect appropriate.   Data Reviewed: I have personally reviewed following labs and imaging studies  CBC:  Recent Labs Lab 07/02/16 0310 07/03/16 0203 07/04/16 0214 07/05/16 0226 07/06/16 0232  WBC 12.8* 15.2* 14.3* 17.5* 16.4*  HGB 9.0* 8.5* 8.5* 8.1* 9.6*  HCT 28.1* 26.6* 26.4* 25.4* 28.9*  MCV 85.4 85.3 86.3 86.7 83.5  PLT 523* 438* 332 303 331   Basic Metabolic Panel:  Recent Labs Lab 07/02/16 0310 07/03/16 0203 07/04/16 0214 07/05/16 0226 07/06/16 0232  NA 147* 142 141 140 140  K 4.6 4.6 4.2 4.3 3.9  CL 113* 110 110 112* 112*  CO2 24 20* 23 23 18*  GLUCOSE 142* 157* 132* 118* 76  BUN 27* 26* 23* 23* 24*  CREATININE 2.22* 2.21* 1.97* 1.97* 1.81*  CALCIUM 7.7* 7.6* 7.9* 7.7* 8.2*   GFR: Estimated Creatinine Clearance: 14.9 mL/min (A) (by C-G formula based on SCr of 1.81 mg/dL (H)). Liver Function Tests:  Recent Labs Lab 06/30/16 1210  AST 57*  ALT 24  ALKPHOS 70  BILITOT 2.5*  PROT 5.5*  ALBUMIN 1.9*    Recent Labs Lab 06/30/16 1210  LIPASE <10*   No results for input(s): AMMONIA in the last 168 hours. Coagulation Profile:  Recent Labs Lab 06/30/16 2207 07/02/16 1043  INR 1.73 1.40   Cardiac Enzymes: No results for input(s): CKTOTAL, CKMB, CKMBINDEX, TROPONINI in the last 168 hours. BNP (last 3 results) No results for input(s): PROBNP in the last 8760 hours. HbA1C: No results for  input(s): HGBA1C in the last 72 hours. CBG:  Recent Labs Lab 07/01/16 0309  GLUCAP 117*   Lipid Profile: No results for input(s): CHOL, HDL, LDLCALC, TRIG, CHOLHDL, LDLDIRECT in the last 72 hours. Thyroid Function Tests: No results for input(s): TSH, T4TOTAL, FREET4, T3FREE, THYROIDAB in the last 72 hours. Anemia Panel: No results for input(s): VITAMINB12, FOLATE, FERRITIN, TIBC, IRON, RETICCTPCT in the last 72 hours. Urine analysis:    Component Value Date/Time   COLORURINE AMBER (A) 06/30/2016 1638   APPEARANCEUR CLEAR 06/30/2016 1638   LABSPEC 1.020 06/30/2016 1638   PHURINE 5.0 06/30/2016 1638   GLUCOSEU NEGATIVE 06/30/2016 1638   GLUCOSEU NEGATIVE 05/19/2016 0959   HGBUR NEGATIVE 06/30/2016 1638   BILIRUBINUR NEGATIVE 06/30/2016 1638   KETONESUR NEGATIVE 06/30/2016 1638   PROTEINUR 30 (A) 06/30/2016 1638   UROBILINOGEN 2.0 (A) 05/19/2016 0959   NITRITE NEGATIVE 06/30/2016 1638   LEUKOCYTESUR NEGATIVE 06/30/2016 1638   Sepsis Labs: (procalcitonin:4,lacticidven:4)  ) Recent Results (from the past 240 hour(s))  Aerobic/Anaerobic Culture (surgical/deep wound)     Status: Abnormal (Preliminary result)  Collection Time: 07/02/16  5:19 PM  Result Value Ref Range Status   Specimen Description DRAINAGE BILE  Final   Special Requests NONE  Final   Gram Stain   Final    ABUNDANT WBC PRESENT, PREDOMINANTLY PMN NO ORGANISMS SEEN    Culture (A)  Final    MULTIPLE ORGANISMS PRESENT, NONE PREDOMINANT NO ANAEROBES ISOLATED; CULTURE IN PROGRESS FOR 5 DAYS    Report Status PENDING  Incomplete  Culture, blood (Routine X 2) w Reflex to ID Panel     Status: None (Preliminary result)   Collection Time: 07/04/16  2:31 PM  Result Value Ref Range Status   Specimen Description BLOOD LEFT HAND  Final   Special Requests   Final    IN PEDIATRIC BOTTLE Blood Culture results may not be optimal due to an inadequate volume of blood received in culture bottles   Culture NO GROWTH  < 24 HOURS  Final   Report Status PENDING  Incomplete  Culture, blood (Routine X 2) w Reflex to ID Panel     Status: None (Preliminary result)   Collection Time: 07/04/16  2:32 PM  Result Value Ref Range Status   Specimen Description BLOOD LEFT HAND  Final   Special Requests   Final    IN PEDIATRIC BOTTLE Blood Culture results may not be optimal due to an inadequate volume of blood received in culture bottles   Culture NO GROWTH < 24 HOURS  Final   Report Status PENDING  Incomplete     Invalid input(s): PROCALCITONIN, LACTICACIDVEN   Radiology Studies: Dg Chest Port 1 View  Result Date: 07/05/2016 CLINICAL DATA:  Shortness of breath. EXAM: PORTABLE CHEST 1 VIEW COMPARISON:  06/17/2016 FINDINGS: Bilateral calcified pleural plaques. Small right and moderate left pleural effusion. Bilateral interstitial thickening. No pneumothorax. Stable cardiomegaly. Thoracic aortic atherosclerosis. No acute osseous abnormality. IMPRESSION: Findings most concerning for CHF. Electronically Signed   By: Elige Ko   On: 07/05/2016 08:49        Scheduled Meds: . aspirin EC  81 mg Oral Daily  . diltiazem  180 mg Oral Daily  . heparin  5,000 Units Subcutaneous Q8H  . meropenem (MERREM) IV  1 g Intravenous Q24H  . metoprolol tartrate  12.5 mg Oral BID  .  morphine injection  0.5 mg Intravenous Once  . multivitamin  1 tablet Oral BID  . multivitamin with minerals  1 tablet Oral Daily  . OLANZapine  2.5 mg Oral QHS  . pentoxifylline  400 mg Oral TID WC  . sodium chloride flush  3 mL Intravenous Q12H   Continuous Infusions:    LOS: 6 days    Time spent: 35 minutes    Ladrea Holladay A, MD [facility] Pager [phone]  If 7PM-7AM, please contact night-coverage [email or web address] Password [email or web address] 07/06/2016, 11:45 AM

## 2016-07-06 NOTE — Progress Notes (Signed)
  Subjective: Stable.  Depressed affect.  Poor insight.  In no distress clinically. Bile drainage is clear.  Does not look infected.  Output not recorded. Bile cultures contaminated, multiple organisms. On meropenem as monotherapy for acute cholecystitis  Objective: Vital signs in last 24 hours: Temp:  [98 F (36.7 C)-98.2 F (36.8 C)] 98.2 F (36.8 C) (04/03 0448) Pulse Rate:  [58-104] 104 (04/03 0448) Resp:  [18-20] 20 (04/03 0448) BP: (102-128)/(74-100) 126/100 (04/03 0448) SpO2:  [91 %-99 %] 99 % (04/03 0448) Last BM Date: 07/04/16  Intake/Output from previous day: No intake/output data recorded. Intake/Output this shift: No intake/output data recorded.  General appearance: Arousable.  Slowed mentation.  Poor insight.  No physical distress.  Rolled up in bed GI: Abdomen soft.  Nontender.  Nondistended.  Cholecystostomy tube in place.  Site looks good.  Bowel clear.  Lab Results:   Recent Labs  07/05/16 0226 07/06/16 0232  WBC 17.5* 16.4*  HGB 8.1* 9.6*  HCT 25.4* 28.9*  PLT 303 331   BMET  Recent Labs  07/05/16 0226 07/06/16 0232  NA 140 140  K 4.3 3.9  CL 112* 112*  CO2 23 18*  GLUCOSE 118* 76  BUN 23* 24*  CREATININE 1.97* 1.81*  CALCIUM 7.7* 8.2*   PT/INR No results for input(s): LABPROT, INR in the last 72 hours. ABG  Recent Labs  07/05/16 1620  PHART 7.338*  HCO3 18.8*    Studies/Results: Dg Chest Port 1 View  Result Date: 07/05/2016 CLINICAL DATA:  Shortness of breath. EXAM: PORTABLE CHEST 1 VIEW COMPARISON:  06/17/2016 FINDINGS: Bilateral calcified pleural plaques. Small right and moderate left pleural effusion. Bilateral interstitial thickening. No pneumothorax. Stable cardiomegaly. Thoracic aortic atherosclerosis. No acute osseous abnormality. IMPRESSION: Findings most concerning for CHF. Electronically Signed   By: Elige Ko   On: 07/05/2016 08:49    Anti-infectives: Anti-infectives    Start     Dose/Rate Route Frequency Ordered  Stop   07/04/16 1400  meropenem (MERREM) 1 g in sodium chloride 0.9 % 100 mL IVPB     1 g 200 mL/hr over 30 Minutes Intravenous Every 24 hours 07/04/16 1348     07/01/16 0200  piperacillin-tazobactam (ZOSYN) IVPB 2.25 g  Status:  Discontinued     2.25 g 100 mL/hr over 30 Minutes Intravenous Every 8 hours 06/30/16 1714 07/04/16 1310   06/30/16 1730  piperacillin-tazobactam (ZOSYN) IVPB 3.375 g     3.375 g 100 mL/hr over 30 Minutes Intravenous  Once 06/30/16 1722 06/30/16 1923   06/30/16 1715  piperacillin-tazobactam (ZOSYN) IVPB 3.375 g  Status:  Discontinued     3.375 g 100 mL/hr over 30 Minutes Intravenous  Once 06/30/16 1711 06/30/16 1721      Assessment/Plan:  Cholecystitis s/p IR percutaneous cholecystostomy tube 3/30   -Basically doing well from percutaneous cholecystostomy  -Afebrile, but leukocytosis persist.   -May advance diet as tolerated  Sepsis AKI Dementia H/o CVA Atrial fibrillation with RVR  ID - zosyn 3/28>>4/1, merrem 4/1>> FEN - CLD, SLP consult pending  Plan - continue IV antibiotics and follow drain culture.  Will plan to continue perc chole tube x6-8 weeks.  Would refer to IR drain clinic for injection study in 6-8 weeks Once discharged she will need to follow-up with drain clinic and with Dr. Fredricka Bonine in 6-8 weeks AFTER drain study. It is not clear that she will ever become a candidate for cholecystectomy..    LOS: 6 days    Tyneshia Stivers M 07/06/2016

## 2016-07-06 NOTE — Progress Notes (Signed)
Since Pt. has been more alert since d/c pain medication. Pt. Has a difficult time with some meds and will not take some MD is aware.

## 2016-07-06 NOTE — Progress Notes (Addendum)
Nutrition Follow Up  DOCUMENTATION CODES:   Severe malnutrition in context of chronic illness  INTERVENTION:    Boost Breeze po TID, each supplement provides 250 kcal and 9 grams of protein   Consider short term nutrition support via CORTRAK small bore feeding tube  NUTRITION DIAGNOSIS:   Malnutrition (Severe ) related to chronic illness (CVA, dementia) as evidenced by severe depletion of muscle mass, severe depletion of body fat, ongoing  GOAL:   Patient will meet greater than or equal to 90% of their needs, unmet  MONITOR:   PO intake, Supplement acceptance, Labs, Weight trends, I & O's  ASSESSMENT:   83 y.o. Female with medical history significant of dementia, history of CVA and chronic atrial fibrillation, not anticoagulated secondary to previous GI bleed. Patient came into the hospital because of abdominal pain, nausea and vomiting. All of the history obtained from daughter at bedside, patient started to have some abdominal pain and nausea about a week ago, started to have vomiting this morning and worsening abdominal pain so she came into the hospital for further evaluation.   Pt s/p perc cholecystostomy tube placed per IR 3/30. Also s/p bedside swallow evaluation 3/31.  Pt with severe aspiration risk. Currently on Clear Liquids.  Not eating.  Very lethargic and confused. Surgery note reviewed.  Needs perc chole tube for 6-8 weeks. Will add Boost Breeze (clear liquid nutrition supplement).  Diet Order:  DIET - DYS 1 Room service appropriate? Yes; Fluid consistency: Nectar Thick  Skin:  Reviewed, no issues  Last BM:  4/1  Height:   Ht Readings from Last 1 Encounters:  07/01/16  (1.499 m)   Weight:   Wt Readings from Last 1 Encounters:  07/03/16 108 lb 8 oz (49.2 kg)   Ideal Body Weight:  44.6 kg  BMI:  Body mass index is 21.91 kg/m.  Estimated Nutritional Needs:   Kcal:  1200-1400  Protein:  55-65 gm  Fluid:  </= 1.5 L  EDUCATION NEEDS:    No education needs identified at this time  Maureen Chatters, RD, LDN Pager #: [phone] After-Hours Pager #: [phone]

## 2016-07-06 NOTE — Progress Notes (Signed)
Referring Physician(s): Dr. Phylliss Blakes  Supervising Physician: Richarda Overlie  Patient Status:  [facility] - In-pt  Chief Complaint:  Acute cholecystitis S/P percutaneous cholecystostomy by Dr. Deanne Coffer on 07/02/2016  Subjective:  [name] is resting comfortably She has a family member at her bedside She is confused and can't tell me how she feels.  Allergies: Patient has no known allergies.  Medications: Prior to Admission medications   Medication Sig Start Date End Date Taking? Authorizing Provider  aspirin 81 MG EC tablet 1 TABLET DAILY WITH FOOD 06/17/16  Yes Corwin Levins, MD  atorvastatin (LIPITOR) 10 MG tablet 1/2 tab by mouth daily 06/17/16  Yes Corwin Levins, MD  diltiazem (CARDIZEM CD) 180 MG 24 hr capsule Take 1 capsule (180 mg total) by mouth daily. 05/18/16  Yes Bonna Gains Nche, NP  donepezil (ARICEPT) 5 MG tablet Take 1 tablet (5 mg total) by mouth at bedtime. 03/22/16  Yes Costin Otelia Sergeant, MD  fenofibrate micronized (LOFIBRA) 134 MG capsule TAKE 1 CAPSULE (134 MG TOTAL) BY MOUTH DAILY. 05/18/16  Yes Anne Ng, NP  ferrous sulfate 325 (65 FE) MG tablet 1 twice a day 05/18/16  Yes Bonna Gains Nche, NP  hydrochlorothiazide (MICROZIDE) 12.5 MG capsule Take 1 capsule (12.5 mg total) by mouth daily. 06/01/16  Yes Corwin Levins, MD  meclizine (ANTIVERT) 12.5 MG tablet Take 1 tablet (12.5 mg total) by mouth 2 (two) times daily as needed for dizziness (do not use for more than 3days). 05/18/16  Yes Bonna Gains Nche, NP  metoprolol tartrate (LOPRESSOR) 25 MG tablet Take 0.5 tablets (12.5 mg total) by mouth 2 (two) times daily. 05/18/16  Yes Anne Ng, NP  Multiple Vitamin (MULTIVITAMIN WITH MINERALS) TABS tablet Take 1 tablet by mouth daily.   Yes Historical Provider, MD  Multiple Vitamins-Minerals (PRESERVISION AREDS 2) CAPS Take 1 capsule by mouth 2 (two) times daily.   Yes Historical Provider, MD  OLANZapine (ZYPREXA) 2.5 MG tablet Take 2.5 mg by mouth daily.  04/10/16  Yes Historical Provider, MD  pantoprazole (PROTONIX) 40 MG tablet Take 1 tablet (40 mg total) by mouth daily. 06/16/16  Yes Corwin Levins, MD  pentoxifylline (TRENTAL) 400 MG CR tablet TAKE 1 TABLET (400 MG TOTAL) BY MOUTH DAILY. 05/18/16  Yes Bonna Gains Nche, NP  sennosides-docusate sodium (SENOKOT-S) 8.6-50 MG tablet Take 1 tablet by mouth daily. 06/16/16  Yes Corwin Levins, MD  traMADol (ULTRAM) 50 MG tablet Take 1 tablet (50 mg total) by mouth every 8 (eight) hours as needed. 06/16/16  Yes Corwin Levins, MD     Vital Signs: BP 133/85   Pulse (!) 101   Temp 98.2 F (36.8 C) (Oral)   Resp 20   Ht  (1.499 m)   Wt 108 lb 8 oz (49.2 kg)   SpO2 99%   BMI 21.91 kg/m   Physical Exam Awake, looks in my direction but doesn't make eye contact Drain in place RUQ, dressing clean Output not recorded but appears to be about 50 mL clear tan colored thin drainage in gravity bag. Abdomen soft, NTND  Imaging: Ir Perc Cholecystostomy  Result Date: 07/02/2016 CLINICAL DATA:  Acute cholecystitis, non surgical candidate EXAM: PERCUTANEOUS CHOLECYSTOSTOMY TUBE PLACEMENT WITH ULTRASOUND AND FLUOROSCOPIC GUIDANCE FLUOROSCOPY TIME:  0.6 minute (24 uGym2 DAP) TECHNIQUE: The procedure, risks (including but not limited to bleeding, infection, organ damage ), benefits, and alternatives were explained to the daughter. Questions regarding the procedure were encouraged  and answered. The daughter understands and consents to the procedure. Survey ultrasound of the abdomen was performed and an appropriate skin entry site was identified. Skin site was marked, prepped with chlorhexidine, and draped in usual sterile fashion, and infiltrated locally with 1% lidocaine. Intravenous Fentanyl and Versed were administered as conscious sedation during continuous monitoring of the patient's level of consciousness and physiological / cardiorespiratory status by the radiology RN, with a total moderate sedation time of 10  minutes. Under real-time ultrasound guidance, gallbladder was accessed using a transhepatic approach with a 21-gauge needle. Ultrasound image documentation was saved. Bile returned through the hub. Needle was exchanged over a 018 guidewire for transitional dilator which allowed placement of 035 J wire. Over this, a 10.2 French pigtail catheter was advanced and formed centrally in the gallbladder lumen. Small contrast injection confirmed appropriate position. Catheter secured externally with 0 Prolene suture and placed external drain bag. Patient tolerated the procedure well. COMPLICATIONS: COMPLICATIONS none IMPRESSION: 1. Technically successful percutaneous cholecystostomy tube placement with ultrasound and fluoroscopic guidance. Electronically Signed   By: Corlis Leak M.D.   On: 07/02/2016 16:58   Dg Chest Port 1 View  Result Date: 07/05/2016 CLINICAL DATA:  Shortness of breath. EXAM: PORTABLE CHEST 1 VIEW COMPARISON:  06/17/2016 FINDINGS: Bilateral calcified pleural plaques. Small right and moderate left pleural effusion. Bilateral interstitial thickening. No pneumothorax. Stable cardiomegaly. Thoracic aortic atherosclerosis. No acute osseous abnormality. IMPRESSION: Findings most concerning for CHF. Electronically Signed   By: Elige Ko   On: 07/05/2016 08:49    Labs:  CBC:  Recent Labs  07/03/16 0203 07/04/16 0214 07/05/16 0226 07/06/16 0232  WBC 15.2* 14.3* 17.5* 16.4*  HGB 8.5* 8.5* 8.1* 9.6*  HCT 26.6* 26.4* 25.4* 28.9*  PLT 438* 332 303 331    COAGS:  Recent Labs  04/19/16 1816 04/21/16 1410 06/30/16 2207 07/02/16 1043  INR 1.33 1.10 1.73 1.40    BMP:  Recent Labs  07/03/16 0203 07/04/16 0214 07/05/16 0226 07/06/16 0232  NA 142 141 140 140  K 4.6 4.2 4.3 3.9  CL 110 110 112* 112*  CO2 20* 23 23 18*  GLUCOSE 157* 132* 118* 76  BUN 26* 23* 23* 24*  CALCIUM 7.6* 7.9* 7.7* 8.2*  CREATININE 2.21* 1.97* 1.97* 1.81*  GFRNONAA 19* 22* 22* 24*  GFRAA 22* 25* 25*  28*    LIVER FUNCTION TESTS:  Recent Labs  03/12/16 0548 03/18/16 0415 04/19/16 0112 06/30/16 1210  BILITOT 0.6 0.5 0.2* 2.5*  AST 46* 29 21 57*  ALT 34 ALKPHOS 43 42 58 70  PROT 6.3* 5.2* 5.0* 5.5*  ALBUMIN 2.9* 2.3* 2.1* 1.9*    Assessment and Plan:  Acute cholecystitis  S/P perc chole by Dr. Deanne Coffer 07/02/2016  Continue routine drain care.  Will arrange f/u with our drain clinic upon D/C  Gen Surg unsure she will ever be a candidate for cholecystectomy.  Electronically Signed: Gwynneth Macleod PA-C 07/06/2016, 1:24 PM   I spent a total of 15 Minutes at the the patient's bedside AND on the patient's hospital floor or unit, greater than 50% of which was counseling/coordinating care for f/u perc chole

## 2016-07-07 DIAGNOSIS — I5033 Acute on chronic diastolic (congestive) heart failure: Secondary | ICD-10-CM

## 2016-07-07 LAB — CBC
HCT: 30.4 % — ABNORMAL LOW (ref 36.0–46.0)
Hemoglobin: 9.8 g/dL — ABNORMAL LOW (ref 12.0–15.0)
MCH: 26.8 pg (ref 26.0–34.0)
MCHC: 32.2 g/dL (ref 30.0–36.0)
MCV: 83.3 fL (ref 78.0–100.0)
Platelets: 417 10*3/uL — ABNORMAL HIGH (ref 150–400)
RBC: 3.65 MIL/uL — ABNORMAL LOW (ref 3.87–5.11)
RDW: 21.6 % — ABNORMAL HIGH (ref 11.5–15.5)
WBC: 12.2 10*3/uL — ABNORMAL HIGH (ref 4.0–10.5)

## 2016-07-07 LAB — BASIC METABOLIC PANEL
Anion gap: 12 (ref 5–15)
BUN: 25 mg/dL — ABNORMAL HIGH (ref 6–20)
CO2: 18 mmol/L — ABNORMAL LOW (ref 22–32)
Calcium: 8.4 mg/dL — ABNORMAL LOW (ref 8.9–10.3)
Chloride: 111 mmol/L (ref 101–111)
Creatinine, Ser: 1.81 mg/dL — ABNORMAL HIGH (ref 0.44–1.00)
GFR calc Af Amer: 28 mL/min — ABNORMAL LOW (ref >60)
GFR calc non Af Amer: 24 mL/min — ABNORMAL LOW (ref >60)
Glucose, Bld: 84 mg/dL (ref 65–99)
Potassium: 3.9 mmol/L (ref 3.5–5.1)
Sodium: 141 mmol/L (ref 135–145)

## 2016-07-07 LAB — AEROBIC/ANAEROBIC CULTURE (SURGICAL/DEEP WOUND)

## 2016-07-07 LAB — AEROBIC/ANAEROBIC CULTURE W GRAM STAIN (SURGICAL/DEEP WOUND)

## 2016-07-07 MED ORDER — FUROSEMIDE 10 MG/ML IJ SOLN
40.0000 mg | Freq: Once | INTRAMUSCULAR | Status: AC
  Administered 2016-07-07: 40 mg via INTRAVENOUS
  Filled 2016-07-07: qty 4

## 2016-07-07 MED ORDER — RESOURCE THICKENUP CLEAR PO POWD
ORAL | Status: DC | PRN
  Filled 2016-07-07: qty 125

## 2016-07-07 NOTE — Progress Notes (Signed)
Progress Note    Tracy [name]  ZOX:[id number] DOB: February 11, 1927  DOA: 06/30/2016 PCP: Oliver Barre, MD    Brief Narrative:   Chief complaint: Follow-up cholecystitis  Medical records reviewed and are as summarized below:  Tracy [name] is an 83 y.o. female with medical history significant of dementia, history of CVA and chronic atrial fibrillation, not anticoagulated secondary to previous GI bleed. Patient Presented to the hospital 06/30/16 because of abdominal pain, nausea and vomiting. HIDA scan showed acute cholecystitis, general surgery recommended cholecystostomy as she is not a surgical candidate, cholecystostomy done on March 30, she struggling with acute delirium since then, on 4/3 the first day she is waking up and started oral intake. Get PT/OT, she will likely need SNF in 2 days.  Assessment/Plan:   Principal problem:  Sepsis secondary to acute cholecystitis Patient presented to the hospital with HR of 150, WBC of 20 and presence of infection (acute cholecystitis). There was evidence of hypoperfusion with elevated lactic acid of 2.3. CT scan showed acute cholecystitis. Gen. surgery consulted, recommended nonsurgical approach as patient has multiple comorbid conditions. HIDA scan was positive, IR placed percutaneous cholecystostomy. Was on Zosyn switched to meropenem on 4/2 because of persistent leukocytosis. Sepsis physiology resolved, patient currently on meropenem as monotherapy for acute cholecystitis. Patient continues to be weak and deconditioned. Have consulted palliative care for goals of care.  Active problems:  Acute delirium/dementia This is likely secondary to pain medications, acute illness and dehydration in the background of dementia. Sedative medications discontinued, only she is on Zyprexa at night.  AKI Baseline creatinine is 0.5 from 2/13, presented with creatinine of 1.98. She was on IV fluids since admission, CXR now showing congestion, discontinued  given 1 dose of Lasix yesterday and repeated again today as her respiratory status remains tenuous.  Hypokalemia Resolved with supplementation..  Atrial fibrillation with RVR/acute on chronic diastolic CHF Continue Cardizem and Toprol-XL. CHA2DS2-VAScis likely greater than 6, female, HTN, 2+ CVA and 2+ age. She is on low-dose aspirin, anticoagulation is contraindicated secondary to recent significant GI bleed. Patient's respiratory status is tenuous with labored breathing and increased work of breathing. We'll repeat Lasix today. Likely acute diastolic CHF given her A. fib with RVR.  Severe protein energy malnutrition Seen by dietitian with recommendations to place an NG tube for supplemental nutrition. We'll hold off on this and get a palliative care consultation to assess goals of care. Body mass index is 21.91 kg/m.   Family Communication/Anticipated D/C date and plan/Code Status   DVT prophylaxis: Heparin. Code Status: DO NOT RESUSCITATE.  Family Communication: No family at the bedside. Disposition Plan: Home versus SNF depending on progress.   Medical Consultants:    Surgery  Interventional Radiology  Palliative Care   Procedures:    None  Anti-Infectives:    Zosyn 06/30/16--->    Subjective:   Awake, but does not answer my questions appropriately, but does indicate, when asked, that she is having shortness of breath.    Objective:    Vitals:   07/06/16 1005 07/06/16 1407 07/06/16 2202 07/07/16 0350  BP: 133/85 134/80 134/64 132/69  Pulse: (!) 101 89 (!) 114 84  Resp:  (!) Temp:  97.5 F (36.4 C) 97.7 F (36.5 C) 97.3 F (36.3 C)  TempSrc:  Oral Oral Axillary  SpO2:  96% 94% 93%  Weight:      Height:        Intake/Output Summary (Last 24 hours)  at 07/07/16 0830 Last data filed at 07/07/16 0505  Gross per 24 hour  Intake               70 ml  Output               80 ml  Net              -10 ml   Filed Weights   06/30/16 2058  07/01/16 1114 07/03/16 0508  Weight: 41.9 kg (92 lb 6.4 oz) 41.7 kg (92 lb) 49.2 kg (108 lb 8 oz)    Exam: General exam: Appears calm and uncomfortable with increased WOB. Respiratory system: Diminished with rapid shallow breathing. Respiratory effort labored with use of accessory muscles. Cardiovascular system: HSIR. + JVD,  No rubs, gallops or clicks. No murmurs. Gastrointestinal system: Abdomen is nondistended, soft and mildly tender. No organomegaly or masses felt. Normal bowel sounds heard. Cholecystomy drain with purulent fluid. Central nervous system: Alert and disoriented. No focal neurological deficits. Extremities: No clubbing,  or cyanosis. No edema. Skin: No rashes, lesions or ulcers. Psychiatry: Judgement and insight appear impairedl. Mood & affect flat.   Data Reviewed:   I have personally reviewed following labs and imaging studies:  Labs: Basic Metabolic Panel:  Recent Labs Lab 07/03/16 0203 07/04/16 0214 07/05/16 0226 07/06/16 0232 07/07/16 0241  NA 142 141 140 140 141  K 4.6 4.2 4.3 3.9 3.9  CL 110 110 112* 112* 111  CO2 20* 23 23 18* 18*  GLUCOSE 157* 132* 118* 76 84  BUN 26* 23* 23* 24* 25*  CREATININE 2.21* 1.97* 1.97* 1.81* 1.81*  CALCIUM 7.6* 7.9* 7.7* 8.2* 8.4*   GFR Estimated Creatinine Clearance: 14.9 mL/min (A) (by C-G formula based on SCr of 1.81 mg/dL (H)). Liver Function Tests:  Recent Labs Lab 06/30/16 1210  AST 57*  ALT 24  ALKPHOS 70  BILITOT 2.5*  PROT 5.5*  ALBUMIN 1.9*    Recent Labs Lab 06/30/16 1210  LIPASE <10*   No results for input(s): AMMONIA in the last 168 hours. Coagulation profile  Recent Labs Lab 06/30/16 2207 07/02/16 1043  INR 1.73 1.40    CBC:  Recent Labs Lab 07/03/16 0203 07/04/16 0214 07/05/16 0226 07/06/16 0232 07/07/16 0241  WBC 15.2* 14.3* 17.5* 16.4* 12.2*  HGB 8.5* 8.5* 8.1* 9.6* 9.8*  HCT 26.6* 26.4* 25.4* 28.9* 30.4*  MCV 85.3 86.3 86.7 83.5 83.3  PLT 438* 332 303 331 417*    Cardiac Enzymes: No results for input(s): CKTOTAL, CKMB, CKMBINDEX, TROPONINI in the last 168 hours. BNP (last 3 results) No results for input(s): PROBNP in the last 8760 hours. CBG:  Recent Labs Lab 07/01/16 0309  GLUCAP 117*   D-Dimer: No results for input(s): DDIMER in the last 72 hours. Hgb A1c: No results for input(s): HGBA1C in the last 72 hours. Lipid Profile: No results for input(s): CHOL, HDL, LDLCALC, TRIG, CHOLHDL, LDLDIRECT in the last 72 hours. Thyroid function studies: No results for input(s): TSH, T4TOTAL, T3FREE, THYROIDAB in the last 72 hours.  Invalid input(s): FREET3 Anemia work up: No results for input(s): VITAMINB12, FOLATE, FERRITIN, TIBC, IRON, RETICCTPCT in the last 72 hours. Sepsis Labs:  Recent Labs Lab 06/30/16 1221 06/30/16 1541 06/30/16 2207  07/04/16 0214 07/05/16 0226 07/06/16 0232 07/07/16 0241  WBC  --   --  17.2*  < > 14.3* 17.5* 16.4* 12.2*  LATICACIDVEN 2.31* 2.23* 2.1*  --   --   --   --   --   < > =  values in this interval not displayed.  Microbiology Recent Results (from the past 240 hour(s))  Aerobic/Anaerobic Culture (surgical/deep wound)     Status: Abnormal   Collection Time: 07/02/16  5:19 PM  Result Value Ref Range Status   Specimen Description DRAINAGE BILE  Final   Special Requests NONE  Final   Gram Stain   Final    ABUNDANT WBC PRESENT, PREDOMINANTLY PMN NO ORGANISMS SEEN    Culture (A)  Final    MULTIPLE ORGANISMS PRESENT, NONE PREDOMINANT NO ANAEROBES ISOLATED    Report Status 07/07/2016 FINAL  Final  Culture, blood (Routine X 2) w Reflex to ID Panel     Status: None (Preliminary result)   Collection Time: 07/04/16  2:31 PM  Result Value Ref Range Status   Specimen Description BLOOD LEFT HAND  Final   Special Requests   Final    IN PEDIATRIC BOTTLE Blood Culture results may not be optimal due to an inadequate volume of blood received in culture bottles   Culture NO GROWTH 2 DAYS  Final   Report Status  PENDING  Incomplete  Culture, blood (Routine X 2) w Reflex to ID Panel     Status: None (Preliminary result)   Collection Time: 07/04/16  2:32 PM  Result Value Ref Range Status   Specimen Description BLOOD LEFT HAND  Final   Special Requests   Final    IN PEDIATRIC BOTTLE Blood Culture results may not be optimal due to an inadequate volume of blood received in culture bottles   Culture NO GROWTH 2 DAYS  Final   Report Status PENDING  Incomplete    Radiology: Dg Chest Port 1 View  Result Date: 07/05/2016 CLINICAL DATA:  Shortness of breath. EXAM: PORTABLE CHEST 1 VIEW COMPARISON:  06/17/2016 FINDINGS: Bilateral calcified pleural plaques. Small right and moderate left pleural effusion. Bilateral interstitial thickening. No pneumothorax. Stable cardiomegaly. Thoracic aortic atherosclerosis. No acute osseous abnormality. IMPRESSION: Findings most concerning for CHF. Electronically Signed   By: Elige Ko   On: 07/05/2016 08:49    Medications:   . aspirin EC  81 mg Oral Daily  . diltiazem  180 mg Oral Daily  . feeding supplement  1 Container Oral TID BM  . heparin  5,000 Units Subcutaneous Q8H  . meropenem (MERREM) IV  1 g Intravenous Q24H  . metoprolol tartrate  12.5 mg Oral BID  . multivitamin  1 tablet Oral BID  . multivitamin with minerals  1 tablet Oral Daily  . OLANZapine  2.5 mg Oral QHS  . pentoxifylline  400 mg Oral TID WC  . sodium chloride flush  3 mL Intravenous Q12H   Continuous Infusions:  Medical decision making is of high complexity and this patient is at high risk of deterioration, therefore this is a level 3 visit.  (> 4 problem points, 2 data points, high risk)    LOS: 7 days   Mailani Degroote  [facility] Pager [phone]. If unable to reach me by pager, please call my cell phone at [phone].  *Please refer to [email or web address], password [email or web address] to get updated schedule on who will round on this patient, as hospitalists switch teams weekly. If 7PM-7AM, please  contact night-coverage at [email or web address], password [email or web address] for any overnight needs.  07/07/2016, 8:30 AM

## 2016-07-07 NOTE — Evaluation (Signed)
Occupational Therapy Evaluation Patient Details Name: Tracy [name] MRN: [id number] DOB: Apr 03, 1927 Today's Date: 07/07/2016    History of Present Illness Pt is a 83 y/o female with PMH of dementia, CVA, AF, HTN, DVT, and DM admitted with ongoing N/V now s/p percutaneous chole.     Clinical Impression   Per pts son; pt was modified independent with BADL PTA. Currently pt total assist for ADL and bed mobility. Pt presenting with limited ability to follow one step commands, limited verbal communication (was able to state "where are we?" and her name), poor sitting balance, generalized weakness, and impaired cognition from baseline per son impacting her independence and safety with ADL and functional mobility. Recommending SNF for follow up to maximize independence and safety with ADL and functional mobility prior to return home. Pt would benefit from continued skilled OT to address established goals.    Follow Up Recommendations  SNF;Supervision/Assistance - 24 hour    Equipment Recommendations  None recommended by OT    Recommendations for Other Services       Precautions / Restrictions Precautions Precautions: Fall Restrictions Weight Bearing Restrictions: No      Mobility Bed Mobility Overal bed mobility: Needs Assistance Bed Mobility: Supine to Sit;Sit to Supine     Supine to sit: Total assist Sit to supine: Total assist   General bed mobility comments: Total assist for all aspects of bed mobility; pt not attempting to help despite verbal and tactile cues  Transfers                      Balance Overall balance assessment: Needs assistance Sitting-balance support: Bilateral upper extremity supported;Feet supported Sitting balance-Leahy Scale: Zero Sitting balance - Comments: total assist to maintain sitting balance. Strong head tilt to the R and unable to correct with cues, R lateral lean Postural control: Posterior lean;Right lateral lean                                  ADL either performed or assessed with clinical judgement   ADL Overall ADL's : Needs assistance/impaired                                       General ADL Comments: Pt currently overall total assist for ADL. Discussed post acute rehab with pts son; he is agreeable     Vision         Perception     Praxis      Pertinent Vitals/Pain Pain Assessment: Faces Faces Pain Scale: Hurts little more Pain Location: generalized Pain Descriptors / Indicators: Grimacing Pain Intervention(s): Limited activity within patient's tolerance;Monitored during session     Hand Dominance Right   Extremity/Trunk Assessment Upper Extremity Assessment Upper Extremity Assessment: Generalized weakness;Difficult to assess due to impaired cognition   Lower Extremity Assessment Lower Extremity Assessment: Defer to PT evaluation   Cervical / Trunk Assessment Cervical / Trunk Assessment: Kyphotic   Communication Communication Communication: Other (comment) (difficult to understand; limited verbalizations)   Cognition Arousal/Alertness: Lethargic Behavior During Therapy: Flat affect Overall Cognitive Status: Difficult to assess                                 General Comments: Pt able to state name and "where are  we?" Limited ability to follow one step commands. Did nod yes when asked if in pain   General Comments       Exercises     Shoulder Instructions      Home Living Family/patient expects to be discharged to:: Private residence Living Arrangements: Children Available Help at Discharge: Family;Available 24 hours/day Type of Home: House Home Access: Level entry     Home Layout: One level     Bathroom Shower/Tub: Chief Strategy Officer: Standard     Home Equipment: Bedside commode;Walker - 4 wheels;Walker - 2 wheels;Wheelchair - manual;Tub bench   Additional Comments: had HHPT and HHRN prior to admission       Prior Functioning/Environment Level of Independence: Needs assistance  Gait / Transfers Assistance Needed: Uses RW for ambulation. w/c for community ambulation. ADL's / Homemaking Assistance Needed: Per son; pt independent with BADL            OT Problem List: Decreased strength;Decreased activity tolerance;Impaired balance (sitting and/or standing);Decreased coordination;Decreased cognition;Decreased knowledge of use of DME or AE;Decreased safety awareness;Impaired tone;Impaired UE functional use;Pain      OT Treatment/Interventions: Self-care/ADL training;Therapeutic exercise;Energy conservation;DME and/or AE instruction;Therapeutic activities;Cognitive remediation/compensation;Patient/family education;Balance training    OT Goals(Current goals can be found in the care plan section) Acute Rehab OT Goals Patient Stated Goal: none stated OT Goal Formulation: With patient/family Time For Goal Achievement: 07/21/16 Potential to Achieve Goals: Fair ADL Goals Pt Will Perform Eating: with min guard assist;sitting Pt Will Transfer to Toilet: with mod assist;stand pivot transfer;bedside commode Additional ADL Goal #1: Pt will sit EOB x10 minutes with min guard assist as precursor to ADL. Additional ADL Goal #2: Pt will follow one step command 75% of the time in a non distracting environment.  OT Frequency: Min 2X/week   Barriers to D/C:            Co-evaluation              End of Session Equipment Utilized During Treatment: Oxygen Nurse Communication: Mobility status  Activity Tolerance: Patient limited by lethargy Patient left: in bed;with bed alarm set;with call bell/phone within reach;with family/visitor present  OT Visit Diagnosis: Muscle weakness (generalized) (M62.81);Cognitive communication deficit (R41.841)                Time: 1610-9604 OT Time Calculation (min): 17 min Charges:  OT General Charges $OT Visit: 1 Procedure OT Evaluation $OT Eval Moderate  Complexity: 1 Procedure G-Codes:     Ahjanae Cassel A. Brett Albino, M.S., OTR/L Pager: [phone]  Gaye Alken 07/07/2016, 3:15 PM

## 2016-07-07 NOTE — Progress Notes (Signed)
Subjective: Condition unchanged.  Clinically stable.  Afebrile.  Normotensive.  Heart rate 84. Does not communicate but does verbalize a little  WBC down to 12,200 this morning.  Hemoglobin stable at 9.8. Bile drainage clear, 80 mL. Bile Gram stain was negative but culture showed multiple organisms present possible contamination. Remains on meropenem, which is reasonable.  Objective: Vital signs in last 24 hours: Temp:  [97.3 F (36.3 C)-97.7 F (36.5 C)] 97.3 F (36.3 C) (04/04 0350) Pulse Rate:  [84-114] 84 (04/04 0350) Resp:  [20-21] 20 (04/04 0350) BP: (132-134)/(64-85) 132/69 (04/04 0350) SpO2:  [93 %-96 %] 93 % (04/04 0350) Last BM Date: 07/06/16  Intake/Output from previous day: 04/03 0701 - 04/04 0700 In: 70 [P.O.:60] Out: 80 [Drains:80] Intake/Output this shift: Total I/O In: 60 [P.O.:50; Other:10] Out: 80 [Drains:80]  General appearance: Lying in bed.  Mumbling.  Doesn't appear to be in any obvious distress.  Doesn't answer questions GI: Abdomen soft and nontender.  A little bit of discomfort right around drain site.  Nondistended.  No mass.  Lab Results:   Recent Labs  07/06/16 0232 07/07/16 0241  WBC 16.4* 12.2*  HGB 9.6* 9.8*  HCT 28.9* 30.4*  PLT 331 417*   BMET  Recent Labs  07/06/16 0232 07/07/16 0241  NA 140 141  K 3.9 3.9  CL 112* 111  CO2 18* 18*  GLUCOSE 76 84  BUN 24* 25*  CREATININE 1.81* 1.81*  CALCIUM 8.2* 8.4*   PT/INR No results for input(s): LABPROT, INR in the last 72 hours. ABG  Recent Labs  07/05/16 1620  PHART 7.338*  HCO3 18.8*    Studies/Results: Dg Chest Port 1 View  Result Date: 07/05/2016 CLINICAL DATA:  Shortness of breath. EXAM: PORTABLE CHEST 1 VIEW COMPARISON:  06/17/2016 FINDINGS: Bilateral calcified pleural plaques. Small right and moderate left pleural effusion. Bilateral interstitial thickening. No pneumothorax. Stable cardiomegaly. Thoracic aortic atherosclerosis. No acute osseous abnormality.  IMPRESSION: Findings most concerning for CHF. Electronically Signed   By: Elige Ko   On: 07/05/2016 08:49    Anti-infectives: Anti-infectives    Start     Dose/Rate Route Frequency Ordered Stop   07/04/16 1400  meropenem (MERREM) 1 g in sodium chloride 0.9 % 100 mL IVPB     1 g 200 mL/hr over 30 Minutes Intravenous Every 24 hours 07/04/16 1348     07/01/16 0200  piperacillin-tazobactam (ZOSYN) IVPB 2.25 g  Status:  Discontinued     2.25 g 100 mL/hr over 30 Minutes Intravenous Every 8 hours 06/30/16 1714 07/04/16 1310   06/30/16 1730  piperacillin-tazobactam (ZOSYN) IVPB 3.375 g     3.375 g 100 mL/hr over 30 Minutes Intravenous  Once 06/30/16 1722 06/30/16 1923   06/30/16 1715  piperacillin-tazobactam (ZOSYN) IVPB 3.375 g  Status:  Discontinued     3.375 g 100 mL/hr over 30 Minutes Intravenous  Once 06/30/16 1711 06/30/16 1721      Assessment/Plan:   Cholecystitis s/p IR percutaneous cholecystostomy tube 3/30   -Basically doing well from percutaneous cholecystostomy  -Afebrile, leukocytosis resolving   -May advance diet as tolerated  Sepsis AKI Dementia H/o CVA Atrial fibrillation with RVR  ID- zosyn 3/28>>4/1, merrem 4/1>> FEN - CLD, SLP consult pending     continue IV antibiotics,    Perhaps 3 more days,  and follow drain culture.  Will plan to continue perc chole tube x6-8 weeks.  Would refer to IR drain clinic for injection study in 6-8 weeks Once discharged she  will need to follow-up with drain clinic and with Dr. Fredricka Bonine in 6-8 weeks AFTER drain study. It is not clear that she will ever become a candidate for cholecystectomy..   Other than surgical plan listed immediately above, we are not adding anything to her care We will follow from a distance from here on out Gives give [place] a call if any new surgical issues arise    LOS: 7 days    Tracy [name] M 07/07/2016

## 2016-07-07 NOTE — Progress Notes (Signed)
  Speech Language Pathology Treatment: Dysphagia  Patient Details Name: Tracy [name] MRN: [id number] DOB: Jul 04, 1926 Today's Date: 07/07/2016 Time: 4098-1191 SLP Time Calculation (min) (ACUTE ONLY): 28 min  Assessment / Plan / Recommendation Clinical Impression  Ongoing f/u for dysphagia.  Pt is alert, but remains significantly weak.  Unable to hold cup or expend any effort for self-feeding.  Provided with purees, thin and nectar-thick liquids - pt with considerable delays in oral preparation and swallow initiation; multiple sub-swallows noted per each bolus, followed by weak cough.  Pt appears to be uncomfortable; there is limited motivation to eat, and she is likely intermittently aspirating.  WOB increased during efforts to eat.    Son, Tracy [name], present, who admits he is "in the dark" and "doesn't know what to do." It appears his plan, at this point, is to take his mother back home and care for her with the help of his sister.  We talked about the 24/7 care the pt will need at this time.  We also discussed concerns for eating - likely aspiration, further weight loss and dehydration.  Recommend consideration of Palliative Care consult given current condition and to assist family with decision-making for the future.   If MBS would assist with decision-making, we can proceed with that study.        HPI HPI: Tracy [name] a 83 y.o.femalewith medical history significant of dementia, CVA, DM2, diverticulosis, DVT, gastric ulcer, GERD, HTN and chronic atrial fibrillation, not anticoagulated secondary to previous GI bleed. Patient came into the hospital because of abdominal pain, nausea and vomiting. CT scan was done and showed acute cholecystitis. Pt also admitted with sepsis, AKI, hypokalemia, afib with RVR, malnutrition. S/p percutaneous cholecystostomy placement. Seen for swallowing evaluation during prior admission, found to have grossly intact oropharyngeal swallowing fx, regular diet  with thin liquids recommended (07/05/14). Currently on clear liquids, to be advanced by MD as tolerated.       SLP Plan  Continue with current plan of care       Recommendations  Diet recommendations: Dysphagia 1 (puree);Nectar-thick liquid - MBS if outcome would assist with decision-making. Liquids provided via: Cup Medication Administration: Crushed with puree Supervision: Trained caregiver to feed patient Compensations: Slow rate;Small sips/bites                Oral Care Recommendations: Oral care BID SLP Visit Diagnosis: Dysphagia, unspecified (R13.10) Plan: Continue with current plan of care       GO              Ramiah Helfrich L. Samson Frederic, [place] CCC/SLP Pager [phone]   [name] 07/07/2016, 12:12 PM

## 2016-07-08 DIAGNOSIS — Z7189 Other specified counseling: Secondary | ICD-10-CM

## 2016-07-08 DIAGNOSIS — R410 Disorientation, unspecified: Secondary | ICD-10-CM

## 2016-07-08 DIAGNOSIS — Z515 Encounter for palliative care: Secondary | ICD-10-CM

## 2016-07-08 LAB — BASIC METABOLIC PANEL
Anion gap: 13 (ref 5–15)
BUN: 27 mg/dL — AB (ref 6–20)
CHLORIDE: 110 mmol/L (ref 101–111)
CO2: 20 mmol/L — AB (ref 22–32)
CREATININE: 1.92 mg/dL — AB (ref 0.44–1.00)
Calcium: 8.4 mg/dL — ABNORMAL LOW (ref 8.9–10.3)
GFR calc Af Amer: 26 mL/min — ABNORMAL LOW (ref >60)
GFR calc non Af Amer: 22 mL/min — ABNORMAL LOW (ref >60)
GLUCOSE: 67 mg/dL (ref 65–99)
Potassium: 3.9 mmol/L (ref 3.5–5.1)
SODIUM: 143 mmol/L (ref 135–145)

## 2016-07-08 LAB — BRAIN NATRIURETIC PEPTIDE: B Natriuretic Peptide: 1256.3 pg/mL — ABNORMAL HIGH (ref 0.0–100.0)

## 2016-07-08 MED ORDER — ACETAMINOPHEN 325 MG PO TABS
650.0000 mg | ORAL_TABLET | Freq: Four times a day (QID) | ORAL | Status: DC
  Administered 2016-07-08 – 2016-07-10 (×6): 650 mg via ORAL
  Filled 2016-07-08 (×8): qty 2

## 2016-07-08 MED ORDER — DILTIAZEM HCL 25 MG/5ML IV SOLN
10.0000 mg | Freq: Once | INTRAVENOUS | Status: AC
  Administered 2016-07-08: 10 mg via INTRAVENOUS
  Filled 2016-07-08: qty 5

## 2016-07-08 MED ORDER — FUROSEMIDE 10 MG/ML IJ SOLN
40.0000 mg | Freq: Once | INTRAMUSCULAR | Status: AC
  Administered 2016-07-08: 40 mg via INTRAVENOUS
  Filled 2016-07-08: qty 4

## 2016-07-08 MED ORDER — DILTIAZEM HCL 30 MG PO TABS
30.0000 mg | ORAL_TABLET | Freq: Four times a day (QID) | ORAL | Status: DC
  Administered 2016-07-08 – 2016-07-10 (×4): 30 mg via ORAL
  Filled 2016-07-08 (×7): qty 1

## 2016-07-08 MED ORDER — OLANZAPINE 5 MG PO TBDP
2.5000 mg | ORAL_TABLET | Freq: Every day | ORAL | Status: DC
  Administered 2016-07-08 – 2016-07-09 (×2): 2.5 mg via ORAL
  Filled 2016-07-08 (×3): qty 0.5

## 2016-07-08 MED ORDER — BIOTENE DRY MOUTH MT LIQD: 15.0000 mL | OROMUCOSAL | Status: DC | PRN

## 2016-07-08 NOTE — Progress Notes (Signed)
Progress Note    Tracy [name]  ZOX:[id number] DOB: [birth year]  DOA: 06/30/2016 PCP: Oliver Barre, MD    Brief Narrative:   Chief complaint: Follow-up cholecystitis  Medical records reviewed and are as summarized below:  Tracy [name] is an 83 y.o. female with medical history significant of dementia, history of CVA and chronic atrial fibrillation, not anticoagulated secondary to previous GI bleed. Patient Presented to the hospital 06/30/16 because of abdominal pain, nausea and vomiting. HIDA scan showed acute cholecystitis, general surgery recommended cholecystostomy as she is not a surgical candidate, cholecystostomy done on March 30, she struggling with acute delirium since then, on 4/3 the first day she is waking up and started oral intake. Get PT/OT, she will likely need SNF in 2 days.  Assessment/Plan:   Principal problem:  Sepsis secondary to acute cholecystitis Patient presented to the hospital with HR of 150, WBC of 20 and presence of infection (acute cholecystitis). There was evidence of hypoperfusion with elevated lactic acid of 2.3. CT scan showed acute cholecystitis. Gen. surgery consulted, recommended nonsurgical approach as patient has multiple comorbid conditions. HIDA scan was positive, IR placed percutaneous cholecystostomy. Was on Zosyn switched to meropenem on 4/2 because of persistent leukocytosis. Sepsis physiology resolved, patient currently on meropenem as monotherapy for acute cholecystitis. Blood cultures negative to date. Bile cultures with multiple organisms. Patient continues to be weak and deconditioned. Have consulted palliative care for goals of care.  Active problems:  Acute delirium/dementia This was likely secondary to pain medications, acute illness and dehydration in the background of dementia. Sedative medications discontinued, only she is on Zyprexa at night. Mental status slowly improving, but not back to baseline yet.  AKI/pulmonary  edema Baseline creatinine is 0.5 from 2/13, presented with creatinine of 1.98. She was on IV fluids since admission, CXR now showing congestion, given 2 doses of Lasix due to increased work of breathing. We'll repeat Lasix again today. Still with labored breathing.  Hypokalemia Resolved with supplementation.  Atrial fibrillation with RVR/acute on chronic diastolic CHF Continue Cardizem and Toprol-XL. CHA2DS2-VAScis likely greater than 6, female, HTN, 2+ CVA and 2+ age. She is on low-dose aspirin, anticoagulation is contraindicated secondary to recent significant GI bleed. Patient's respiratory status is slightly improved, but work of breathing still increased. Given 2 doses of Lasix. Likely acute diastolic CHF given her A. fib with RVR. Repeat Lasix today.  Severe protein energy malnutrition Seen by dietitian with recommendations to place an NG tube for supplemental nutrition. Family declines. Body mass index is 21.91 kg/m.   Dysphagia ST following.  Continue dysphagia 1 diet with nectar thick liquids.  S/P palliative care consult, family wants to avoid placement of a feeding tube.   Family Communication/Anticipated D/C date and plan/Code Status   DVT prophylaxis: Heparin. Code Status: DO NOT RESUSCITATE.  Family Communication: Son and daughter at bedside. Disposition Plan: Home versus SNF depending on progress.   Medical Consultants:    Surgery  Interventional Radiology  Palliative Care   Procedures:    None  Anti-Infectives:    Zosyn 06/30/16--->  Subjective:   More lucid, complains of pain and shortness of breath.  Restless.  Family present today.  Objective:    Vitals:   07/07/16 1053 07/07/16 1435 07/07/16 2138 07/08/16 0444  BP: 135/69 128/70 97/75 136/85  Pulse: 89 100 92 (!) 106  Resp:  Temp:   98.2 F (36.8 C) 98.3 F (36.8 C)  TempSrc:   Oral  Oral  SpO2:  94% 100% 98%  Weight:      Height:        Intake/Output Summary (Last 24  hours) at 07/08/16 0805 Last data filed at 07/08/16 1610  Gross per 24 hour  Intake                5 ml  Output               30 ml  Net              -25 ml   Filed Weights   06/30/16 2058 07/01/16 1114 07/03/16 0508  Weight: 41.9 kg (92 lb 6.4 oz) 41.7 kg (92 lb) 49.2 kg (108 lb 8 oz)    Exam: General exam: Appears restless and uncomfortable with increased WOB. Respiratory system: Diminished with rapid shallow breathing. Respiratory effort labored with use of accessory muscles, slightly improved today but still present. Cardiovascular system: HSIR. + JVD,  No rubs, gallops or clicks. No murmurs. Gastrointestinal system: Abdomen is nondistended, soft and mildly tender. No organomegaly or masses felt. Normal bowel sounds heard. Cholecystomy drain with purulent fluid. Central nervous system: Alert and disoriented. No focal neurological deficits. Extremities: No clubbing,  or cyanosis. 1+ edema. Skin: No rashes, lesions or ulcers. Psychiatry: Judgement and insight appear impairedl. Mood & affect anxious.   Data Reviewed:   I have personally reviewed following labs and imaging studies:  Labs: Basic Metabolic Panel:  Recent Labs Lab 07/04/16 0214 07/05/16 0226 07/06/16 0232 07/07/16 0241 07/08/16 0334  NA 141 140 140 141 143  K 4.2 4.3 3.9 3.9 3.9  CL 110 112* 112* 111 110  CO2 23 23 18* 18* 20*  GLUCOSE 132* 118* 76 84 67  BUN 23* 23* 24* 25* 27*  CREATININE 1.97* 1.97* 1.81* 1.81* 1.92*  CALCIUM 7.9* 7.7* 8.2* 8.4* 8.4*   GFR Estimated Creatinine Clearance: 14.1 mL/min (A) (by C-G formula based on SCr of 1.92 mg/dL (H)). Liver Function Tests: No results for input(s): AST, ALT, ALKPHOS, BILITOT, PROT, ALBUMIN in the last 168 hours. No results for input(s): LIPASE, AMYLASE in the last 168 hours. No results for input(s): AMMONIA in the last 168 hours. Coagulation profile  Recent Labs Lab 07/02/16 1043  INR 1.40    CBC:  Recent Labs Lab 07/03/16 0203  07/04/16 0214 07/05/16 0226 07/06/16 0232 07/07/16 0241  WBC 15.2* 14.3* 17.5* 16.4* 12.2*  HGB 8.5* 8.5* 8.1* 9.6* 9.8*  HCT 26.6* 26.4* 25.4* 28.9* 30.4*  MCV 85.3 86.3 86.7 83.5 83.3  PLT 438* 332 303 331 417*    Microbiology Recent Results (from the past 240 hour(s))  Aerobic/Anaerobic Culture (surgical/deep wound)     Status: Abnormal   Collection Time: 07/02/16  5:19 PM  Result Value Ref Range Status   Specimen Description DRAINAGE BILE  Final   Special Requests NONE  Final   Gram Stain   Final    ABUNDANT WBC PRESENT, PREDOMINANTLY PMN NO ORGANISMS SEEN    Culture (A)  Final    MULTIPLE ORGANISMS PRESENT, NONE PREDOMINANT NO ANAEROBES ISOLATED    Report Status 07/07/2016 FINAL  Final  Culture, blood (Routine X 2) w Reflex to ID Panel     Status: None (Preliminary result)   Collection Time: 07/04/16  2:31 PM  Result Value Ref Range Status   Specimen Description BLOOD LEFT HAND  Final   Special Requests   Final    IN PEDIATRIC BOTTLE Blood Culture results may not be  optimal due to an inadequate volume of blood received in culture bottles   Culture NO GROWTH 3 DAYS  Final   Report Status PENDING  Incomplete  Culture, blood (Routine X 2) w Reflex to ID Panel     Status: None (Preliminary result)   Collection Time: 07/04/16  2:32 PM  Result Value Ref Range Status   Specimen Description BLOOD LEFT HAND  Final   Special Requests   Final    IN PEDIATRIC BOTTLE Blood Culture results may not be optimal due to an inadequate volume of blood received in culture bottles   Culture NO GROWTH 3 DAYS  Final   Report Status PENDING  Incomplete    Radiology: No results found.  Medications:   . aspirin EC  81 mg Oral Daily  . diltiazem  180 mg Oral Daily  . feeding supplement  1 Container Oral TID BM  . heparin  5,000 Units Subcutaneous Q8H  . meropenem (MERREM) IV  1 g Intravenous Q24H  . metoprolol tartrate  12.5 mg Oral BID  . multivitamin  1 tablet Oral BID  .  multivitamin with minerals  1 tablet Oral Daily  . OLANZapine  2.5 mg Oral QHS  . pentoxifylline  400 mg Oral TID WC  . sodium chloride flush  3 mL Intravenous Q12H   Continuous Infusions:  Medical decision making is of high complexity and this patient is at high risk of deterioration, therefore this is a level 3 visit.  (> 4 problem points, 2 data points, high risk)  Greater than 35 minutes spent with the patient and her family discussing her current treatment, prognosis, and plan of care.    LOS: 8 days   Shamira Toutant  [facility] Pager [phone]. If unable to reach me by pager, please call my cell phone at [phone].  *Please refer to [email or web address], password [email or web address] to get updated schedule on who will round on this patient, as hospitalists switch teams weekly. If 7PM-7AM, please contact night-coverage at [email or web address], password [email or web address] for any overnight needs.  07/08/2016, 8:04 AM

## 2016-07-08 NOTE — Progress Notes (Signed)
K.Kirby N.P. Text page Patient H.R. Is At. Fib rate 120-150's and she did not get her Cardizem CD. This A.M.  Due do you cannot crush it. See orders

## 2016-07-08 NOTE — Progress Notes (Signed)
Referring Physician(s):  Dr. Phylliss Blakes  Supervising Physician: Oley Balm  Patient Status:  [facility] - In-pt  Chief Complaint:  Acute cholecystitis s/p percutaneous cholecystostomy placement 07/02/16  Subjective: Awake and alert in bed.  Pleasantly confused.  Allergies: Patient has no known allergies.  Medications: Prior to Admission medications   Medication Sig Start Date End Date Taking? Authorizing Provider  aspirin 81 MG EC tablet 1 TABLET DAILY WITH FOOD 06/17/16  Yes Corwin Levins, MD  atorvastatin (LIPITOR) 10 MG tablet 1/2 tab by mouth daily 06/17/16  Yes Corwin Levins, MD  diltiazem (CARDIZEM CD) 180 MG 24 hr capsule Take 1 capsule (180 mg total) by mouth daily. 05/18/16  Yes Bonna Gains Nche, NP  donepezil (ARICEPT) 5 MG tablet Take 1 tablet (5 mg total) by mouth at bedtime. 03/22/16  Yes Costin Otelia Sergeant, MD  fenofibrate micronized (LOFIBRA) 134 MG capsule TAKE 1 CAPSULE (134 MG TOTAL) BY MOUTH DAILY. 05/18/16  Yes Anne Ng, NP  ferrous sulfate 325 (65 FE) MG tablet 1 twice a day 05/18/16  Yes Bonna Gains Nche, NP  hydrochlorothiazide (MICROZIDE) 12.5 MG capsule Take 1 capsule (12.5 mg total) by mouth daily. 06/01/16  Yes Corwin Levins, MD  meclizine (ANTIVERT) 12.5 MG tablet Take 1 tablet (12.5 mg total) by mouth 2 (two) times daily as needed for dizziness (do not use for more than 3days). 05/18/16  Yes Bonna Gains Nche, NP  metoprolol tartrate (LOPRESSOR) 25 MG tablet Take 0.5 tablets (12.5 mg total) by mouth 2 (two) times daily. 05/18/16  Yes Anne Ng, NP  Multiple Vitamin (MULTIVITAMIN WITH MINERALS) TABS tablet Take 1 tablet by mouth daily.   Yes Historical Provider, MD  Multiple Vitamins-Minerals (PRESERVISION AREDS 2) CAPS Take 1 capsule by mouth 2 (two) times daily.   Yes Historical Provider, MD  OLANZapine (ZYPREXA) 2.5 MG tablet Take 2.5 mg by mouth daily. 04/10/16  Yes Historical Provider, MD  pantoprazole (PROTONIX) 40 MG tablet Take 1 tablet  (40 mg total) by mouth daily. 06/16/16  Yes Corwin Levins, MD  pentoxifylline (TRENTAL) 400 MG CR tablet TAKE 1 TABLET (400 MG TOTAL) BY MOUTH DAILY. 05/18/16  Yes Bonna Gains Nche, NP  sennosides-docusate sodium (SENOKOT-S) 8.6-50 MG tablet Take 1 tablet by mouth daily. 06/16/16  Yes Corwin Levins, MD  traMADol (ULTRAM) 50 MG tablet Take 1 tablet (50 mg total) by mouth every 8 (eight) hours as needed. 06/16/16  Yes Corwin Levins, MD     Vital Signs: BP 136/85 (BP Location: Left Arm)   Pulse (!) 106   Temp 98.3 F (36.8 C) (Oral)   Resp 20   Ht  (1.499 m)   Wt 102 lb 4.8 oz (46.4 kg)   SpO2 98%   BMI 20.66 kg/m   Physical Exam Awake, engages in conversation but is confused Drain in place RUQ, dressing clean.  Output is mostly thin and yellow.  Abdomen soft, NTND  Imaging: Dg Chest Port 1 View  Result Date: 07/05/2016 CLINICAL DATA:  Shortness of breath. EXAM: PORTABLE CHEST 1 VIEW COMPARISON:  06/17/2016 FINDINGS: Bilateral calcified pleural plaques. Small right and moderate left pleural effusion. Bilateral interstitial thickening. No pneumothorax. Stable cardiomegaly. Thoracic aortic atherosclerosis. No acute osseous abnormality. IMPRESSION: Findings most concerning for CHF. Electronically Signed   By: Elige Ko   On: 07/05/2016 08:49    Labs:  CBC:  Recent Labs  07/04/16 0214 07/05/16 0226 07/06/16 0232 07/07/16 0241  WBC  14.3* 17.5* 16.4* 12.2*  HGB 8.5* 8.1* 9.6* 9.8*  HCT 26.4* 25.4* 28.9* 30.4*  PLT 332 303 331 417*    COAGS:  Recent Labs  04/19/16 1816 04/21/16 1410 06/30/16 2207 07/02/16 1043  INR 1.33 1.10 1.73 1.40    BMP:  Recent Labs  07/05/16 0226 07/06/16 0232 07/07/16 0241 07/08/16 0334  NA 140 140 141 143  K 4.3 3.9 3.9 3.9  CL 112* 112* 111 110  CO2 23 18* 18* 20*  GLUCOSE 118* 76 84 67  BUN 23* 24* 25* 27*  CALCIUM 7.7* 8.2* 8.4* 8.4*  CREATININE 1.97* 1.81* 1.81* 1.92*  GFRNONAA 22* 24* 24* 22*  GFRAA 25* 28* 28* 26*     LIVER FUNCTION TESTS:  Recent Labs  03/12/16 0548 03/18/16 0415 04/19/16 0112 06/30/16 1210  BILITOT 0.6 0.5 0.2* 2.5*  AST 46* 29 21 57*  ALT 34 ALKPHOS 43 42 58 70  PROT 6.3* 5.2* 5.0* 5.5*  ALBUMIN 2.9* 2.3* 2.1* 1.9*    Assessment and Plan: Acute Cholecystitis s/p perc chole placement 07/02/16 Continue routine drain care. Will arrange f/u with our drain clinic upon D/C. Gen Surg unsure she will ever be a candidate for cholecystectomy. Now followed by Palliative Care.  Electronically Signed: Hoyt Koch PA-C 07/08/2016, 3:45 PM   I spent a total of 15 Minutes at the the patient's bedside AND on the patient's hospital floor or unit, greater than 50% of which was counseling/coordinating care for f/u perc chole

## 2016-07-08 NOTE — Consult Note (Signed)
   [facility] CM Inpatient Consult   07/08/2016  Tracy [name] [birth year] [id number]   Chart reviewed for frequent hospitalizations, 5 in the past 6 months.  Note reveals that the patient is an 83 y/o female with PMH of dementia, CVA, AF, HTN, DVT, and DM admitted with ongoing N/V now s/p percutaneous chole. Currently, the patient is recommended for skilled nursing at discharge. Also, noted Palliative Care notes. Will follow for appropriate needs.  For questions, please contact:  Charlesetta Shanks, RN BSN CCM [facility]  [phone] business mobile phone Toll free office [phone]

## 2016-07-08 NOTE — Consult Note (Signed)
Consultation Note Date: 07/08/2016   Patient Name: Tracy [name]  DOB: November 21, 1926  MRN: [id number]  Age / Sex: 83 y.o., female  PCP: Tracy Levins, MD Referring Physician: Maryruth Bun Rama, MD  Reason for Consultation: Establishing goals of care  HPI/Patient Profile: 83 y.o. female  with past medical history of dementia, CVA, chronic A. Fib (not anticoagulated d/t GI bleed), HTN, DM2, PVD, and GERD. She presented to ED with a week of progressive abdominal pain, nausea, and then onset of vomiting. She was admitted on 06/30/2016 with sepsis secondary to acute cholecystitis, and AKI. Pt was deemed not a surgical candidate, with subsequent placement of percutaneous cholecystostomy tube 3/30. AKI stable, with creatinine ~1.9 (baseline 0.5).  Since admission she has developed fluid overload issues, BNP 1,256, with concern for acute on chronic CHF. Furthermore, she has developed acute delirium with resultant poor oral intake. Palliative consulted to assist in goals of care.  Clinical Assessment and Goals of Care: Tracy [name] is not able to meaningfully participate in a goals of care conversation secondary to delirium. As I approached her room her son and daughter were in the hall speaking with Dr. Darnelle Catalan. She touched on the medical issues, and broached the concern about Tracy [name]'s poor oral intake and the possibility of considering a feeding tube. After speaking with Dr. Darnelle Catalan, I then sat down with them in a conference room for a more in-depth conversation about goals of care.   We talked about Tracy [name]'s medical issues on presentation to the hospital, which included sepsis secondary to acute cholecystitis, AKI, fluid overload issues, and delirium. As we talked through these issues I explained how we evaluated these issues, what was being done to manage and monitor them, and how things had changed since admission. We then had a long discussion about delirium, and I  shared that the timeline for improvement was unknown, and there was the possibility she may never fully improve. Part of the concern about the unknown timeline for improvement is her lack of adequate oral intake. I emphasized that she needed to be eating and drinking [and doing so safely] to enable healing, strength, and to maintain life. Her family was adamant that she had improved over the past few days, and were very hopeful that her intake would improve as well. We talked about supporting and monitoring, and then reassessing over the next 24-48 hours. We did discuss a feeding tube if she were not eating, which I recommended against, and they agreed would not be in her best interest.   Primary Decision Maker NEXT OF KIN   SUMMARY OF RECOMMENDATIONS    Plan to monitor over the next 24-48 hours to determine change in oral intake  Start scheduled tylenol for pain; pt inconsistent in reporting and will try to eliminate a potential cause of delirium  Standard delirium management (adapted from NICE guidelines 2011 for prevention of delirium):  Provide continuity of care when possible (avoid frequent changing of surroundings and staff).  Frequent reorientation to time with:  A clock should always be visible.  Make sure Calendar/white board is updated.  Lights on/blinds open during the day and off/closed at night.  Encourage frequent family visits.  Monitor for and treat dehydration/constipation.  Optimize oxygen saturation.  Avoid catheters and IV's when possible and look for/treat infections.  Encourage early mobility.  Assess and treat pain.  Ensure adequate nutrition and functioning dentures.  Address reversible causes of hearing and visual impairment:  Use pocket talker if  hearing aids are unavailable.  Avoid sleep disturbance (normalize sleep/wake cycle).  Minimize disturbances and consider NOT obtaining vitals at night if possible.  Review Medications to avoid polypharmacy and avoid  deliriogenic medications when possible   Addressing family concerns: -Pt with frequent urination due to lasix: Discussed utilizing a urinary catheter for comfort. Reviewed the risk/benefit of a catheter, and daughter was in agreement to pursue one -Skin breakdown on bottom due to moisture: Discussed with care nurse about frequent changing, and utilization of skin barrier cream -Dry mouth: Oral care ordered q2H with Biotene rinse  Code Status/Advance Care Planning:  DNR  Palliative Prophylaxis:   Aspiration, Delirium Protocol, Frequent Pain Assessment and Oral Care  Additional Recommendations (Limitations, Scope, Preferences):  Continue to medically support; plan to follow over the next 1-2 days to monitor for change.  Psycho-social/Spiritual:   Desire for further Chaplaincy support:no  Additional Recommendations: TBD  Prognosis:   Unable to determine  Discharge Planning: To Be Determined      Primary Diagnoses: Present on Admission: . Acute cholecystitis . Sepsis (HCC) . Abdominal pain . AKI (acute kidney injury) (HCC) . Atrial fibrillation with RVR (HCC) . Protein-calorie malnutrition, severe . Acute on chronic diastolic CHF (congestive heart failure) (HCC)   I have reviewed the medical record, interviewed the patient and family, and examined the patient. The following aspects are pertinent.  Past Medical History:  Diagnosis Date  . Atrial fibrillation (HCC)   . CAROTID STENOSIS   . CVA (cerebrovascular accident) (HCC) 2002  . DIABETES MELLITUS, TYPE II   . DIVERTICULOSIS, COLON   . DM (diabetes mellitus), type 2 (HCC)   . DVT (deep venous thrombosis) (HCC)   . Gastric ulcer   . GERD   . HTN (hypertension)   . Hx of adenomatous colonic polyps 2007  . Hyperlipidemia   . Hypertension   . OSTEOPENIA   . PVD (peripheral vascular disease) (HCC)    recently dx w/ carotid -left  int carotid >90% 50-60% rt int carotid 02/2010  . PVD (peripheral vascular  disease) (HCC)    lf  ABI 0.11 and rt 0.43  . Sinus node dysfunction (HCC) 2011  . Stroke [facility]) July 01, 2014   Affected Left side  . Upper GI bleed 02/2016   Social History   Social History  . Marital status: Widowed    Spouse name: N/A  . Number of children: 5  . Years of education: N/A   Occupational History  . retired Ship broker   Social History Main Topics  . Smoking status: Former Games developer  . Smokeless tobacco: Never Used     Comment: QUIT IN 38  . Alcohol use No  . Drug use: No  . Sexual activity: Not Asked   Other Topics Concern  . None   Social History Narrative   ** Merged History Encounter **       Family History  Problem Relation Age of Onset  . Colon cancer Mother   . Diabetes Sister     Amputation   Scheduled Meds: . aspirin EC  81 mg Oral Daily  . diltiazem  180 mg Oral Daily  . feeding supplement  1 Container Oral TID BM  . heparin  5,000 Units Subcutaneous Q8H  . meropenem (MERREM) IV  1 g Intravenous Q24H  . metoprolol tartrate  12.5 mg Oral BID  . multivitamin  1 tablet Oral BID  . multivitamin with minerals  1 tablet Oral Daily  . OLANZapine  2.5  mg Oral QHS  . pentoxifylline  400 mg Oral TID WC  . sodium chloride flush  3 mL Intravenous Q12H   Continuous Infusions: PRN Meds:.acetaminophen **OR** acetaminophen, ondansetron **OR** ondansetron (ZOFRAN) IV, RESOURCE THICKENUP CLEAR No Known Allergies   Review of Systems  -Limited d/t pt confusion -C/o pain (unable to localize or describe)  Physical Exam  Constitutional: She appears cachectic. She has a sickly appearance. Nasal cannula in place.  Eyes: EOM are normal.  Neck: Normal range of motion. Neck supple.  Cardiovascular: An irregularly irregular rhythm present. Tachycardia present.   Pulmonary/Chest: No accessory muscle usage. Tachypnea noted. She has decreased breath sounds in the right lower field and the left lower field. She has no wheezes. She has rhonchi (throughout).    Abdominal: Soft. Bowel sounds are normal. There is no tenderness.  Perc. Cholecystostomy in place, pale yellow drainage  Genitourinary:  Genitourinary Comments: Urinary incontinence  Musculoskeletal: She exhibits edema (1+ BLE).  Significant weakness  Neurological: She is alert.  Socially interactive, but does not answer questions nor does she follow simple commands  Skin: Skin is warm and dry.  Skin excoriation/erythema on bottom  Psychiatric: Her mood appears anxious. Her speech is tangential and slurred. She is agitated. She expresses impulsivity and inappropriate judgment.    Vital Signs: BP 136/85 (BP Location: Left Arm)   Pulse (!) 106   Temp 98.3 F (36.8 C) (Oral)   Resp 20   Ht  (1.499 m)   Wt 49.2 kg (108 lb 8 oz)   SpO2 98%   BMI 21.91 kg/m  Pain Assessment: No/denies pain   Pain Score: 0-No pain   SpO2: SpO2: 98 % O2 Device:SpO2: 98 % O2 Flow Rate: .O2 Flow Rate (L/min): 2 L/min  IO: Intake/output summary:  Intake/Output Summary (Last 24 hours) at 07/08/16 0739 Last data filed at 07/08/16 1610  Gross per 24 hour  Intake                5 ml  Output               30 ml  Net              -25 ml    LBM: Last BM Date: 07/06/16 Baseline Weight: Weight: 41.9 kg (92 lb 6.4 oz) Most recent weight: Weight: 49.2 kg (108 lb 8 oz)     Palliative Assessment/Data: PPS 20-30%    Time In: 0900 Time Out: 1100 Time Total: 120 minutes Greater than 50%  of this time was spent counseling and coordinating care related to the above assessment and plan.  Signed by: Murrell Converse, NP Palliative Medicine Team Pager # [phone] (M-F 7a-5p) Team Phone # [phone] (Nights/Weekends)

## 2016-07-08 NOTE — Progress Notes (Signed)
  Speech Language Pathology Treatment: Dysphagia  Patient Details Name: Tracy [name] MRN: [id number] DOB: Mar 20, 1927 Today's Date: 07/08/2016 Time: 4098-1191 SLP Time Calculation (min) (ACUTE ONLY): 25 min  Assessment / Plan / Recommendation Clinical Impression  Tracy [name] was seen for dysphagia treatment today with noteable improvements (both oral and pharyngeal) compared to prior treatment session. Repositioned patient for comfort and upright posture for safety during PO intake and oral care was provided due to dry oral cavity. Oral transit with trials of pureed solids, nectar thick liquids, and thin liquids (via cup/straw) was timely and efficient. No coughing, throat clearing, or indications of airway compromise at bedside today. Mentation appears improved, however fluctuation of cognition is likely given pt's dementia. Recommend continuation of Dys 1 solids, nectar thick liquids, meds crushed. If improvements continue, upgrade to thin liquids is possible. ST will follow up.   HPI HPI: Tracy [name] a 83 y.o.femalewith medical history significant of dementia, CVA, DM2, diverticulosis, DVT, gastric ulcer, GERD, HTN and chronic atrial fibrillation, not anticoagulated secondary to previous GI bleed. Patient came into the hospital because of abdominal pain, nausea and vomiting. CT scan was done and showed acute cholecystitis. Pt also admitted with sepsis, AKI, hypokalemia, afib with RVR, malnutrition. S/p percutaneous cholecystostomy placement. Seen for swallowing evaluation during prior admission, found to have grossly intact oropharyngeal swallowing fx, regular diet with thin liquids recommended (07/05/14).       SLP Plan  Continue with current plan of care       Recommendations  Diet recommendations: Dysphagia 1 (puree);Nectar-thick liquid Liquids provided via: Cup Medication Administration: Crushed with puree Supervision: Trained caregiver to feed patient Compensations: Slow  rate;Small sips/bites;Minimize environmental distractions Postural Changes and/or Swallow Maneuvers: Seated upright 90 degrees                Oral Care Recommendations: Oral care BID Follow up Recommendations: Other (comment) (TBD) SLP Visit Diagnosis: Dysphagia, unspecified (R13.10) Plan: Continue with current plan of care       GO                Macarthur Critchley , Student-SLP 07/08/2016, 1:03 PM

## 2016-07-08 NOTE — Progress Notes (Signed)
qPhysical Therapy Treatment Patient Details Name: Tracy [name] MRN: [id number] DOB: 1927-03-29 Today's Date: 07/08/2016    History of Present Illness Pt is a 83 y/o female with PMH of dementia, CVA, AF, HTN, DVT, and DM admitted with ongoing N/V now s/p percutaneous chole.      PT Comments    Patient progressing some able to assist with bed mobility.  Fluctuating participation in sitting balance activities, but RN reports best she has taken medication during session.  Continue to feel she will need SNF level rehab at d/c.    Follow Up Recommendations  SNF;Supervision/Assistance - 24 hour     Equipment Recommendations       Recommendations for Other Services       Precautions / Restrictions Precautions Precautions: Fall    Mobility  Bed Mobility Overal bed mobility: Needs Assistance Bed Mobility: Rolling;Sidelying to Sit;Sit to Sidelying Rolling: Mod assist Sidelying to sit: Max assist     Sit to sidelying: Mod assist General bed mobility comments: cues and increased time for participation to reach for railing to roll with assist to bring legs off bed and lift trunk, pt assist with railing, then to sidelying pt assist to lower trunk, assist for legs  Transfers                 General transfer comment: NT due to RN and NT report family upset pt OOB on Tuesday and how she was handeled by nursing for back to bed; did place lift pad in chair, but family not in room as meeting with palliative care; pt requesting to lie down so returned and positioned in supine with elevated HOB  Ambulation/Gait                 Stairs            Wheelchair Mobility    Modified Rankin (Stroke Patients Only)       Balance Overall balance assessment: Needs assistance Sitting-balance support: Feet supported Sitting balance-Leahy Scale: Zero Sitting balance - Comments: at times pushing posteriorly, fluctuated from total assist to mod A for balance; pt sat EOB about  8 minutes attempt to engage in brushing hair, pt refused, then RN in to give meds in puree.                                     Cognition Arousal/Alertness: Awake/alert Behavior During Therapy: Anxious Overall Cognitive Status: No family/caregiver present to determine baseline cognitive functioning                                        Exercises General Exercises - Lower Extremity Heel Slides: AAROM;Both;5 reps;Supine    General Comments        Pertinent Vitals/Pain Faces Pain Scale: Hurts even more Pain Location: holding R side Pain Descriptors / Indicators: Guarding;Grimacing;Moaning Pain Intervention(s): Monitored during session;Repositioned;Patient requesting pain meds-RN notified;RN gave pain meds during session    Home Living                      Prior Function            PT Goals (current goals can now be found in the care plan section) Progress towards PT goals: Progressing toward goals    Frequency    Min  2X/week      PT Plan Current plan remains appropriate    Co-evaluation             End of Session Equipment Utilized During Treatment: Oxygen Activity Tolerance: Patient limited by fatigue Patient left: in bed;with call bell/phone within reach;with nursing/sitter in room   PT Visit Diagnosis: Muscle weakness (generalized) (M62.81);Unsteadiness on feet (R26.81)     Time: 7829-5621 PT Time Calculation (min) (ACUTE ONLY): 15 min  Charges:  $Therapeutic Activity: 8-22 mins                    G CodesSheran Lawless, [place] [phone] 07/08/2016    Elray Mcgregor 07/08/2016, 10:24 AM

## 2016-07-09 ENCOUNTER — Inpatient Hospital Stay (HOSPITAL_COMMUNITY): Payer: Medicare Other

## 2016-07-09 LAB — BASIC METABOLIC PANEL
Anion gap: 12 (ref 5–15)
BUN: 27 mg/dL — AB (ref 6–20)
CHLORIDE: 109 mmol/L (ref 101–111)
CO2: 23 mmol/L (ref 22–32)
CREATININE: 1.89 mg/dL — AB (ref 0.44–1.00)
Calcium: 8.1 mg/dL — ABNORMAL LOW (ref 8.9–10.3)
GFR calc non Af Amer: 23 mL/min — ABNORMAL LOW (ref >60)
GFR, EST AFRICAN AMERICAN: 26 mL/min — AB (ref >60)
Glucose, Bld: 106 mg/dL — ABNORMAL HIGH (ref 65–99)
Potassium: 3.3 mmol/L — ABNORMAL LOW (ref 3.5–5.1)
Sodium: 144 mmol/L (ref 135–145)

## 2016-07-09 LAB — CULTURE, BLOOD (ROUTINE X 2)
CULTURE: NO GROWTH
Culture: NO GROWTH

## 2016-07-09 MED ORDER — FUROSEMIDE 10 MG/ML PO SOLN
40.0000 mg | Freq: Every day | ORAL | Status: DC
  Administered 2016-07-09: 40 mg via ORAL
  Filled 2016-07-09: qty 5
  Filled 2016-07-09: qty 4

## 2016-07-09 MED ORDER — POTASSIUM CHLORIDE 20 MEQ PO PACK
20.0000 meq | PACK | Freq: Two times a day (BID) | ORAL | Status: DC
  Administered 2016-07-09: 20 meq via ORAL
  Filled 2016-07-09 (×3): qty 1

## 2016-07-09 MED ORDER — TRAMADOL HCL 50 MG PO TABS
25.0000 mg | ORAL_TABLET | Freq: Four times a day (QID) | ORAL | Status: DC | PRN
  Administered 2016-07-09 (×2): 25 mg via ORAL
  Filled 2016-07-09 (×4): qty 1

## 2016-07-09 NOTE — Progress Notes (Signed)
  Subjective: Condition unchanged.  Severe dementia and deconditioning. Afebrile. Bile drainage 35 mL.  Clear. Final cultures unreadable due to multiple organisms present On meropenem.  Objective: Vital signs in last 24 hours: Temp:  [97.7 F (36.5 C)-98.4 F (36.9 C)] 97.7 F (36.5 C) (04/06 0402) Pulse Rate:  [107-138] 114 (04/06 0402) Resp:  [20-24] 20 (04/06 0402) BP: (99-132)/(57-78) 123/78 (04/06 0402) SpO2:  [91 %-100 %] 99 % (04/06 0402) Weight:  [46.4 kg (102 lb 4.8 oz)-46.7 kg (103 lb)] 46.7 kg (103 lb) (04/06 0402) Last BM Date: 07/08/16  Intake/Output from previous day: 04/05 0701 - 04/06 0700 In: 20  Out: 38 [Urine:3; Drains:35] Intake/Output this shift: Total I/O In: 10 [Other:10] Out: -    General appearance: Lying in bed.  Mumbling.  Doesn't appear to be in any obvious distress.  Doesn't answer questions GI: Abdomen soft and nontender.  A little bit of discomfort right around drain site.  Nondistended.  No mass    Lab Results:   Recent Labs  07/07/16 0241  WBC 12.2*  HGB 9.8*  HCT 30.4*  PLT 417*   BMET  Recent Labs  07/08/16 0334 07/09/16 0235  NA 143 144  K 3.9 3.3*  CL 110 109  CO2 20* 23  GLUCOSE 67 106*  BUN 27* 27*  CREATININE 1.92* 1.89*  CALCIUM 8.4* 8.1*   PT/INR No results for input(s): LABPROT, INR in the last 72 hours. ABG No results for input(s): PHART, HCO3 in the last 72 hours.  Invalid input(s): PCO2, PO2  Studies/Results: No results found.  Anti-infectives: Anti-infectives    Start     Dose/Rate Route Frequency Ordered Stop   07/04/16 1400  meropenem (MERREM) 1 g in sodium chloride 0.9 % 100 mL IVPB     1 g 200 mL/hr over 30 Minutes Intravenous Every 24 hours 07/04/16 1348     07/01/16 0200  piperacillin-tazobactam (ZOSYN) IVPB 2.25 g  Status:  Discontinued     2.25 g 100 mL/hr over 30 Minutes Intravenous Every 8 hours 06/30/16 1714 07/04/16 1310   06/30/16 1730  piperacillin-tazobactam (ZOSYN) IVPB  3.375 g     3.375 g 100 mL/hr over 30 Minutes Intravenous  Once 06/30/16 1722 06/30/16 1923   06/30/16 1715  piperacillin-tazobactam (ZOSYN) IVPB 3.375 g  Status:  Discontinued     3.375 g 100 mL/hr over 30 Minutes Intravenous  Once 06/30/16 1711 06/30/16 1721      Assessment/Plan:   Cholecystitis s/p IR percutaneous cholecystostomy tube 3/30 -Basically doing well from percutaneous cholecystostomy -Afebrile, leukocytosis resolving. -May advance diet as tolerated  -Recommend discontinue meropenem from surgical standpoint  -  Sepsis AKI Dementia H/o CVA Atrial fibrillation with RVR  ID- zosyn 3/28>>4/1, merrem 4/1>> FEN - CLD, SLP consult pending     Recommend discontinue IV antibiotics. Will plan to continue percchole tube x6-8 weeks.  Would refer to IR drain clinic for injection study in 6-8 weeks Once discharged she will need to follow-up with drain clinic andwith Dr. Fredricka Bonine in 6-8 weeks AFTER drain study. It is not clear that she will ever become a candidate for cholecystectomy..   We will sign off at this point. Please reconsult of surgical issues arise    LOS: 9 days    Tracy [name] M 07/09/2016

## 2016-07-09 NOTE — Progress Notes (Signed)
Occupational Therapy Treatment Patient Details Name: PERLE BRICKHOUSE MRN: [id number] DOB: [birth year] Today's Date: 07/09/2016    History of present illness Pt is a 83 y/o female with PMH of dementia, CVA, AF, HTN, DVT, and DM admitted with ongoing N/V now s/p percutaneous chole.     OT comments  This 83 yo female admitted with above presents to acute OT with not showing progress towards goals. We will continue to follow to see if gains can be made.  Follow Up Recommendations  SNF;Supervision/Assistance - 24 hour    Equipment Recommendations  None recommended by OT       Precautions / Restrictions Precautions Precautions: Fall Restrictions Weight Bearing Restrictions: No       Mobility  Transfers                 General transfer comment: total to scoot to [facility] using bed positioning and chuck pad--not following commands to bend up her knees to try to help with scooting up        ADL either performed or assessed with clinical judgement   ADL Overall ADL's : Needs assistance/impaired   Eating/Feeding Details (indicate cue type and reason): Tried to get pt to eat bits of her food tray and she would not open her mouth   Grooming Details (indicate cue type and reason): Tried to get pt to wash her face by herself and she would not attempt so I then tried hand over hand and she refused--pushing me away                                     Vision Patient Visual Report: No change from baseline            Cognition Arousal/Alertness: Awake/alert Behavior During Therapy: Agitated (at times) Overall Cognitive Status: No family/caregiver present to determine baseline cognitive functioning                                 General Comments: Pt unable to state any of date or where she was. She was able to tell me that she did not want anything to eat after asking her about several different items on her plate.                     Pertinent Vitals/ Pain       Pain Assessment: No/denies pain Faces Pain Scale: No hurt         Frequency  Min 2X/week        Progress Toward Goals  OT Goals(current goals can now be found in the care plan section)  Progress towards OT goals: Not progressing toward goals - comment (not following commands to try and do tasks asked of her)     Plan Discharge plan remains appropriate       End of Session Equipment Utilized During Treatment: Oxygen  OT Visit Diagnosis: Muscle weakness (generalized) (M62.81);Cognitive communication deficit (R41.841)   Activity Tolerance Patient limited by lethargy   Patient Left in bed;with call bell/phone within reach;with bed alarm set   Nurse Communication          Time: 2956-2130 OT Time Calculation (min): 14 min  Charges: OT General Charges $OT Visit: 1 Procedure OT Treatments $Self Care/Home Management : 8-22 mins  Ignacia Palma, OTR/L [phone] 07/09/2016

## 2016-07-09 NOTE — Progress Notes (Signed)
Nutrition Consult/Follow Up  DOCUMENTATION CODES:   Severe malnutrition in context of chronic illness  INTERVENTION:    Mighty Shake II TID with meals, each supplement provides 480-500 kcals and 20-23 grams of protein  NUTRITION DIAGNOSIS:   Malnutrition (Severe ) related to chronic illness (CVA, dementia) as evidenced by severe depletion of muscle mass, severe depletion of body fat, ongoing  GOAL:   Patient will meet greater than or equal to 90% of their needs, unmet  MONITOR:   PO intake, Supplement acceptance, Labs, Weight trends, I & O's  ASSESSMENT:   83 y.o. Female with medical history significant of dementia, history of CVA and chronic atrial fibrillation, not anticoagulated secondary to previous GI bleed. Patient came into the hospital because of abdominal pain, nausea and vomiting. All of the history obtained from daughter at bedside, patient started to have some abdominal pain and nausea about a week ago, started to have vomiting this morning and worsening abdominal pain so she came into the hospital for further evaluation.   Pt s/p perc cholecystostomy tube placed per IR 3/30. Speech Path following for dysphagia.  Now on Dys 1-Nectar liquids. Palliative Care Team note reviewed.  Family does not desire feeding tube. Will add Mighty Shake II supplements (appropriate for nectar thick consistency). Needs perc chole tube for 6-8 weeks.  Diet Order:  DIET - DYS 1 Room service appropriate? Yes; Fluid consistency: Nectar Thick  Skin:  Reviewed, no issues  Last BM:  4/6  Height:   Ht Readings from Last 1 Encounters:  07/01/16  (1.499 m)   Weight:   Wt Readings from Last 1 Encounters:  07/09/16 103 lb (46.7 kg)   Ideal Body Weight:  44.6 kg  BMI:  Body mass index is 20.8 kg/m.  Estimated Nutritional Needs:   Kcal:  1200-1400  Protein:  55-65 gm  Fluid:  </= 1.5 L  EDUCATION NEEDS:   No education needs identified at this time  Maureen Chatters,  RD, LDN Pager #: [phone] After-Hours Pager #: [phone]

## 2016-07-09 NOTE — Progress Notes (Signed)
Progress Note    Tracy KARIS  [name]:[id number] DOB: 08/13/1926  DOA: 06/30/2016 PCP: Oliver Barre, MD    Brief Narrative:   Chief complaint: Follow-up cholecystitis  Medical records reviewed and are as summarized below:  Tracy [name] is an 83 y.o. female with medical history significant of dementia, history of CVA and chronic atrial fibrillation, not anticoagulated secondary to previous GI bleed. Patient Presented to the hospital 06/30/16 because of abdominal pain, nausea and vomiting. HIDA scan showed acute cholecystitis, general surgery recommended cholecystostomy as she is not a surgical candidate, cholecystostomy done on March 30. Mental status slowly improving.  Assessment/Plan:   Principal problem:  Sepsis secondary to acute cholecystitis Patient presented to the hospital with HR of 150, WBC of 20 and presence of infection (acute cholecystitis). There was evidence of hypoperfusion with elevated lactic acid of 2.3. CT scan showed acute cholecystitis. Gen. surgery consulted, recommended nonsurgical approach as patient has multiple comorbid conditions. HIDA scan was positive, IR placed percutaneous cholecystostomy. Was on Zosyn switched to meropenem on 4/2 because of persistent leukocytosis. Sepsis physiology resolved, patient currently on meropenem as monotherapy for acute cholecystitis. Blood cultures negative to date. Bile cultures with multiple organisms. Patient continues to be weak and deconditioned. Palliative care following. Will discontinue IV antibiotics per surgery recommendations and plan to continue percutaneous cholecystostomy drainage for 6-8 weeks. Will need to follow-up in the drain clinic after drain study performed.  Active problems:  Acute delirium/dementia This was likely secondary to pain medications, acute illness and dehydration in the background of dementia. Sedative medications discontinued, only she is on Zyprexa at night. Mental status slowly  improving, but not back to baseline yet.  AKI/pulmonary edema Baseline creatinine is 0.5 from 2/13, presented with creatinine of 1.98. She was on IV fluids since admission, CXR subsequently showed congestion, given Lasix for the past 3 days. Will initiate Lasix 40 mg daily orally.  Hypokalemia Potassium low today, likely from diuretics, will place on oral supplementation.  Atrial fibrillation with RVR/acute on chronic diastolic CHF Continue Cardizem and Toprol-XL. CHA2DS2-VAScis likely greater than 6, female, HTN, 2+ CVA and 2+ age. She is on low-dose aspirin, anticoagulation is contraindicated secondary to recent significant GI bleed. Patient's respiratory status is improved with less work of breathing. Status post 3 doses of IV Lasix 3 days. Likely acute diastolic CHF given her A. fib with RVR. Put on oral Lasix.  Severe protein energy malnutrition Seen by dietitian with recommendations to place an NG tube for supplemental nutrition. Family declines. Body mass index is 21.91 kg/m. Continues to have poor oral intake. Dietitian following for recommendations.  Dysphagia ST following.  Continue dysphagia 1 diet with nectar thick liquids.  S/P palliative care consult, family wants to avoid placement of a feeding tube. Had a modified barium swallow study today and is at moderate risk for aspiration. Diet advanced to pured with thin liquids.   Family Communication/Anticipated D/C date and plan/Code Status   DVT prophylaxis: Heparin. Code Status: DO NOT RESUSCITATE.  Family Communication: Son and daughter at bedside 07/08/16. Disposition Plan: Home versus SNF depending on progress.   Medical Consultants:    Surgery  Interventional Radiology  Palliative Care   Procedures:    MBS 07/09/16: Moderate risk for aspiration.  Anti-Infectives:   Zosyn 06/30/16 ---> 07/04/16 Meropenem 07/04/16---> 07/09/16   Subjective:   Awake and alert today, says she feels "terrible", but still does  not answer questions coherently.  Seems to be having some abdominal  pain, and does say she is short of breath.  Objective:    Vitals:   07/08/16 2057 07/08/16 2310 07/08/16 2324 07/09/16 0402  BP: 132/62 (!) 99/57  123/78  Pulse: (!) 138 (!) 137 (!) 107 (!) 114  Resp: 20 (!) 24  20  Temp: 98.4 F (36.9 C)   97.7 F (36.5 C)  TempSrc: Axillary   Axillary  SpO2: 91% 100%  99%  Weight:    46.7 kg (103 lb)  Height:        Intake/Output Summary (Last 24 hours) at 07/09/16 0820 Last data filed at 07/09/16 0539  Gross per 24 hour  Intake               20 ml  Output              422 ml  Net             -402 ml   Filed Weights   07/03/16 0508 07/08/16 0934 07/09/16 0402  Weight: 49.2 kg (108 lb 8 oz) 46.4 kg (102 lb 4.8 oz) 46.7 kg (103 lb)    Exam: General exam: Appears restless and uncomfortable with decreased WOB. Respiratory system: Diminished with rapid shallow breathing. Respiratory effort improved.  Cardiovascular system: HSIR. + JVD,  No rubs, gallops or clicks. No murmurs. Gastrointestinal system: Abdomen is nondistended, soft and mildly tender. No organomegaly or masses felt. Normal bowel sounds heard. Cholecystomy drain with purulent fluid. Central nervous system: Alert and disoriented. No focal neurological deficits. Extremities: No clubbing,  or cyanosis. 1+ edema. Skin: No rashes, lesions or ulcers. Psychiatry: Judgement and insight appear impaired. Mood & affect anxious.   Data Reviewed:   I have personally reviewed following labs and imaging studies:  Labs: Basic Metabolic Panel:  Recent Labs Lab 07/05/16 0226 07/06/16 0232 07/07/16 0241 07/08/16 0334 07/09/16 0235  NA 140 140 141 143 144  K 4.3 3.9 3.9 3.9 3.3*  CL 112* 112* 111 110 109  CO2 23 18* 18* 20* 23  GLUCOSE 118* 76 84 67 106*  BUN 23* 24* 25* 27* 27*  CREATININE 1.97* 1.81* 1.81* 1.92* 1.89*  CALCIUM 7.7* 8.2* 8.4* 8.4* 8.1*   GFR Estimated Creatinine Clearance: 14.3 mL/min (A) (by  C-G formula based on SCr of 1.89 mg/dL (H)). Liver Function Tests: No results for input(s): AST, ALT, ALKPHOS, BILITOT, PROT, ALBUMIN in the last 168 hours. No results for input(s): LIPASE, AMYLASE in the last 168 hours. No results for input(s): AMMONIA in the last 168 hours. Coagulation profile  Recent Labs Lab 07/02/16 1043  INR 1.40    CBC:  Recent Labs Lab 07/03/16 0203 07/04/16 0214 07/05/16 0226 07/06/16 0232 07/07/16 0241  WBC 15.2* 14.3* 17.5* 16.4* 12.2*  HGB 8.5* 8.5* 8.1* 9.6* 9.8*  HCT 26.6* 26.4* 25.4* 28.9* 30.4*  MCV 85.3 86.3 86.7 83.5 83.3  PLT 438* 332 303 331 417*    Microbiology Recent Results (from the past 240 hour(s))  Aerobic/Anaerobic Culture (surgical/deep wound)     Status: Abnormal   Collection Time: 07/02/16  5:19 PM  Result Value Ref Range Status   Specimen Description DRAINAGE BILE  Final   Special Requests NONE  Final   Gram Stain   Final    ABUNDANT WBC PRESENT, PREDOMINANTLY PMN NO ORGANISMS SEEN    Culture (A)  Final    MULTIPLE ORGANISMS PRESENT, NONE PREDOMINANT NO ANAEROBES ISOLATED    Report Status 07/07/2016 FINAL  Final  Culture, blood (Routine X 2)  w Reflex to ID Panel     Status: None (Preliminary result)   Collection Time: 07/04/16  2:31 PM  Result Value Ref Range Status   Specimen Description BLOOD LEFT HAND  Final   Special Requests   Final    IN PEDIATRIC BOTTLE Blood Culture results may not be optimal due to an inadequate volume of blood received in culture bottles   Culture NO GROWTH 4 DAYS  Final   Report Status PENDING  Incomplete  Culture, blood (Routine X 2) w Reflex to ID Panel     Status: None (Preliminary result)   Collection Time: 07/04/16  2:32 PM  Result Value Ref Range Status   Specimen Description BLOOD LEFT HAND  Final   Special Requests   Final    IN PEDIATRIC BOTTLE Blood Culture results may not be optimal due to an inadequate volume of blood received in culture bottles   Culture NO GROWTH 4  DAYS  Final   Report Status PENDING  Incomplete    Radiology: No results found.  Medications:   . acetaminophen  650 mg Oral Q6H  . aspirin EC  81 mg Oral Daily  . diltiazem  30 mg Oral Q6H  . feeding supplement  1 Container Oral TID BM  . heparin  5,000 Units Subcutaneous Q8H  . meropenem (MERREM) IV  1 g Intravenous Q24H  . metoprolol tartrate  12.5 mg Oral BID  . multivitamin  1 tablet Oral BID  . multivitamin with minerals  1 tablet Oral Daily  . OLANZapine zydis  2.5 mg Oral QHS  . pentoxifylline  400 mg Oral TID WC  . sodium chloride flush  3 mL Intravenous Q12H   Continuous Infusions:  Medical decision making is of high complexity and this patient is at high risk of deterioration, therefore this is a level 3 visit.  (> 4 problem points, 2 data points, Moderate risk)    LOS: 9 days   RAMA,CHRISTINA  [facility] Pager [phone]. If unable to reach me by pager, please call my cell phone at [phone].  *Please refer to [email or web address], password [email or web address] to get updated schedule on who will round on this patient, as hospitalists switch teams weekly. If 7PM-7AM, please contact night-coverage at [email or web address], password [email or web address] for any overnight needs.  07/09/2016, 8:20 AM

## 2016-07-09 NOTE — Progress Notes (Signed)
Verbal order received from Dr. Darnelle Catalan to discontinue cardiac monitoring.

## 2016-07-09 NOTE — Progress Notes (Signed)
Modified Barium Swallow Progress Note  Patient Details  Name: CATRICIA SCHEERER MRN: [id number] Date of Birth: [birth year]  Today's Date: 07/09/2016  Modified Barium Swallow completed.  Full report located under Chart Review in the Imaging Section.  Brief recommendations include the following:  Clinical Impression  Patient presents with a mild oropharyngeal dysphagia, cognitively based in nature, characterized by oral holding of bolus (worse with solids), delayed oral transit, and intermittently delayed swallow initiation. Combination of delayed swallow and mildly decreased laryngeal strength results in flash penetration of liquids but no frank penetration or aspiration. Aspiration risk remains moderate at this time given general deconditioning and cognitive status which fluctuates.    Swallow Evaluation Recommendations       SLP Diet Recommendations: Dysphagia 1 (Puree) solids;Thin liquid   Liquid Administration via: Cup;Straw   Medication Administration: Crushed with puree   Supervision: Staff to assist with self feeding;Full supervision/cueing for compensatory strategies   Compensations: Slow rate;Small sips/bites;Minimize environmental distractions   Postural Changes: Seated upright at 90 degrees   Oral Care Recommendations: Oral care BID      Mykah Shin MA, CCC-SLP [phone]   Cristan Scherzer Meryl 07/09/2016,11:50 AM

## 2016-07-09 NOTE — Progress Notes (Signed)
Referring Physician(s):  Dr. Phylliss Blakes  Supervising Physician: Oley Balm  Patient Status:  [facility] - In-pt  Chief Complaint:  Acute cholecystitis s/p percutaneous cholecystostomy placement 07/02/16  Subjective: Awake and alert in bed.  More confused today.  Allergies: Patient has no known allergies.  Medications: Prior to Admission medications   Medication Sig Start Date End Date Taking? Authorizing Provider  aspirin 81 MG EC tablet 1 TABLET DAILY WITH FOOD 06/17/16  Yes Corwin Levins, MD  atorvastatin (LIPITOR) 10 MG tablet 1/2 tab by mouth daily 06/17/16  Yes Corwin Levins, MD  diltiazem (CARDIZEM CD) 180 MG 24 hr capsule Take 1 capsule (180 mg total) by mouth daily. 05/18/16  Yes Bonna Gains Nche, NP  donepezil (ARICEPT) 5 MG tablet Take 1 tablet (5 mg total) by mouth at bedtime. 03/22/16  Yes Costin Otelia Sergeant, MD  fenofibrate micronized (LOFIBRA) 134 MG capsule TAKE 1 CAPSULE (134 MG TOTAL) BY MOUTH DAILY. 05/18/16  Yes Anne Ng, NP  ferrous sulfate 325 (65 FE) MG tablet 1 twice a day 05/18/16  Yes Bonna Gains Nche, NP  hydrochlorothiazide (MICROZIDE) 12.5 MG capsule Take 1 capsule (12.5 mg total) by mouth daily. 06/01/16  Yes Corwin Levins, MD  meclizine (ANTIVERT) 12.5 MG tablet Take 1 tablet (12.5 mg total) by mouth 2 (two) times daily as needed for dizziness (do not use for more than 3days). 05/18/16  Yes Bonna Gains Nche, NP  metoprolol tartrate (LOPRESSOR) 25 MG tablet Take 0.5 tablets (12.5 mg total) by mouth 2 (two) times daily. 05/18/16  Yes Anne Ng, NP  Multiple Vitamin (MULTIVITAMIN WITH MINERALS) TABS tablet Take 1 tablet by mouth daily.   Yes Historical Provider, MD  Multiple Vitamins-Minerals (PRESERVISION AREDS 2) CAPS Take 1 capsule by mouth 2 (two) times daily.   Yes Historical Provider, MD  OLANZapine (ZYPREXA) 2.5 MG tablet Take 2.5 mg by mouth daily. 04/10/16  Yes Historical Provider, MD  pantoprazole (PROTONIX) 40 MG tablet Take 1 tablet  (40 mg total) by mouth daily. 06/16/16  Yes Corwin Levins, MD  pentoxifylline (TRENTAL) 400 MG CR tablet TAKE 1 TABLET (400 MG TOTAL) BY MOUTH DAILY. 05/18/16  Yes Bonna Gains Nche, NP  sennosides-docusate sodium (SENOKOT-S) 8.6-50 MG tablet Take 1 tablet by mouth daily. 06/16/16  Yes Corwin Levins, MD  traMADol (ULTRAM) 50 MG tablet Take 1 tablet (50 mg total) by mouth every 8 (eight) hours as needed. 06/16/16  Yes Corwin Levins, MD     Vital Signs: BP 123/78 (BP Location: Right Arm)   Pulse (!) 114   Temp 97.7 F (36.5 C) (Axillary)   Resp 20   Ht  (1.499 m)   Wt 103 lb (46.7 kg)   SpO2 99%   BMI 20.80 kg/m   Physical Exam Awake, confused Drain in place RUQ, dressing clean.  Output is mostly thin and yellow- 70 mL output recorded yesterday.  Abdomen soft, NTND  Imaging: No results found.  Labs:  CBC:  Recent Labs  07/04/16 0214 07/05/16 0226 07/06/16 0232 07/07/16 0241  WBC 14.3* 17.5* 16.4* 12.2*  HGB 8.5* 8.1* 9.6* 9.8*  HCT 26.4* 25.4* 28.9* 30.4*  PLT 332 303 331 417*    COAGS:  Recent Labs  04/19/16 1816 04/21/16 1410 06/30/16 2207 07/02/16 1043  INR 1.33 1.10 1.73 1.40    BMP:  Recent Labs  07/06/16 0232 07/07/16 0241 07/08/16 0334 07/09/16 0235  NA 140 141 143 144  K  3.9 3.9 3.9 3.3*  CL 112* 111 110 109  CO2 18* 18* 20* 23  GLUCOSE 76 84 67 106*  BUN 24* 25* 27* 27*  CALCIUM 8.2* 8.4* 8.4* 8.1*  CREATININE 1.81* 1.81* 1.92* 1.89*  GFRNONAA 24* 24* 22* 23*  GFRAA 28* 28* 26* 26*    LIVER FUNCTION TESTS:  Recent Labs  03/12/16 0548 03/18/16 0415 04/19/16 0112 06/30/16 1210  BILITOT 0.6 0.5 0.2* 2.5*  AST 46* 29 21 57*  ALT 34 ALKPHOS 43 42 58 70  PROT 6.3* 5.2* 5.0* 5.5*  ALBUMIN 2.9* 2.3* 2.1* 1.9*    Assessment and Plan: Acute Cholecystitis s/p perc chole placement 07/02/16 Continue routine drain care. Gen surgery unsure she will ever be a candidate for cholecystectomy but plans to see in follow-up after  discharge. Now followed by Palliative Care. Will place orders for patient to receive call from schedulers after discharge for drain injection in IR clinic in 6-8 weeks per surgery recommedation.  No family at bedside today. IR available if needed.  Electronically Signed: Hoyt Koch PA-C 07/09/2016, 11:45 AM   I spent a total of 15 Minutes at the the patient's bedside AND on the patient's hospital floor or unit, greater than 50% of which was counseling/coordinating care for f/u perc chole

## 2016-07-09 NOTE — Progress Notes (Signed)
Daily Progress Note   Patient Name: Tracy [name]       Date: 07/09/2016 DOB: 05-Aug-1926  Age: 83 y.o. MRN#: [id number] Attending Physician: Maryruth Bun Rama, MD Primary Care Physician: Oliver Barre, MD Admit Date: 06/30/2016  Reason for Consultation/Follow-up: Disposition and Establishing goals of care  Subjective: Tracy [name] is more awake and interactive today compared to yesterday. She was able to tell me her name and that she was at a hospital. She is otherwise not answering questions or following commands. Her intake remains poor despite strong encouragement. No family at bedside.   Length of Stay: 9  Current Medications: Scheduled Meds:  . acetaminophen  650 mg Oral Q6H  . aspirin EC  81 mg Oral Daily  . diltiazem  30 mg Oral Q6H  . feeding supplement  1 Container Oral TID BM  . furosemide  40 mg Oral Daily  . heparin  5,000 Units Subcutaneous Q8H  . metoprolol tartrate  12.5 mg Oral BID  . multivitamin  1 tablet Oral BID  . multivitamin with minerals  1 tablet Oral Daily  . OLANZapine zydis  2.5 mg Oral QHS  . pentoxifylline  400 mg Oral TID WC  . potassium chloride  20 mEq Oral BID  . sodium chloride flush  3 mL Intravenous Q12H    Continuous Infusions:   PRN Meds: Oral care **AND** antiseptic oral rinse, ondansetron **OR** ondansetron (ZOFRAN) IV, RESOURCE THICKENUP CLEAR, traMADol  Physical Exam    Constitutional: She appears cachectic. She has a sickly appearance. Nasal cannula in place.  Eyes: EOM are normal.  Neck: Normal range of motion. Neck supple.  Cardiovascular: An irregularly irregular rhythm present. Tachycardia present.   Pulmonary/Chest: No accessory muscle usage. Tachypnea noted. She has decreased breath sounds in the right lower field and the left lower field. She has no wheezes. She has rhonchi  (throughout).  Abdominal: Soft. Bowel sounds are normal. There is no tenderness.  Perc. Cholecystostomy in place, pale yellow drainage  Genitourinary:  Genitourinary Comments: Foley Musculoskeletal: She exhibits edema (1+ BLE).  Significant weakness  Neurological: She is alert.  Socially interactive, oriented to self and place only. Does not answer other questions nor does she follow simple commands  Skin: Skin is warm and dry.  Skin excoriation/erythema on bottom  Psychiatric: Her mood appears anxious. Her speech is tangential and slurred. She is agitated. She expresses impulsivity and inappropriate judgment.        Vital Signs: BP 123/78 (BP Location: Right Arm)   Pulse (!) 114   Temp 97.7 F (36.5 C) (Axillary)   Resp 20   Ht  (1.499 m)   Wt 46.7 kg (103 lb)   SpO2 99%   BMI 20.80 kg/m  SpO2: SpO2: 99 % O2 Device: O2 Device: Nasal Cannula O2 Flow Rate: O2 Flow Rate (L/min): 2 L/min  Intake/output summary:  Intake/Output Summary (Last 24 hours) at 07/09/16 1311 Last data filed at 07/09/16 0539  Gross per 24 hour  Intake               20 ml  Output              420 ml  Net             -400 ml   LBM: Last BM Date: 07/09/16 Baseline Weight: Weight: 41.9 kg (92 lb 6.4 oz) Most recent weight: Weight: 46.7 kg (103 lb)  Palliative Assessment/Data: PPS 20-30%   Flowsheet Rows     Most Recent Value  Intake Tab  Referral Department  Hospitalist  Unit at Time of Referral  Cardiac/Telemetry Unit  Palliative Care Primary Diagnosis  Cardiac  Date Notified  07/07/16  Palliative Care Type  New Palliative care  Reason for referral  Clarify Goals of Care  Date of Admission  06/30/16  Date first seen by Palliative Care  07/08/16  # of days Palliative referral response time  1 Day(s)  # of days IP prior to Palliative referral  7  Clinical Assessment  Psychosocial & Spiritual Assessment  Palliative Care Outcomes      Patient Active Problem List   Diagnosis Date Noted   . Acute delirium   . Palliative care by specialist   . Goals of care, counseling/discussion   . Protein-calorie malnutrition, severe 07/01/2016  . Acute cholecystitis 06/30/2016  . Sepsis (HCC) 06/30/2016  . Abdominal pain 06/19/2016  . Chest pain 06/19/2016  . Hyperglycemia 06/01/2016  . Long-term (current) use of anticoagulants 04/29/2016  . Acute GI bleeding 04/19/2016  . BRBPR (bright red blood per rectum) 04/19/2016  . Encounter for therapeutic drug monitoring 04/13/2016  . Dehydration   . Urine incontinence 03/11/2016  . Hypokalemia 03/11/2016  . Loss of appetite 03/11/2016  . Leukocytosis 03/11/2016  . AKI (acute kidney injury) (HCC) 03/11/2016  . Gastric ulcer 03/04/2016  . Acute blood loss anemia   . Acute on chronic diastolic CHF (congestive heart failure) (HCC)   . Dilated cardiomyopathy (HCC)   . Chronic atrial fibrillation (HCC)   . Pulmonary HTN   . Right lumbar radiculopathy 02/12/2016  . Superficial bruising of foot 02/12/2016  . Irregular heart beats 02/04/2016  . General weakness 02/04/2016  . Atrial fibrillation with RVR (HCC) 02/04/2016  . Anemia, iron deficiency 02/04/2016  . Hypercalcemia 02/04/2016  . Acute kidney injury (HCC) 02/04/2016  . Right sided sciatica 11/18/2015  . Trigger point of right shoulder region 05/13/2015  . Cerebrovascular accident (CVA) due to thrombosis of right posterior cerebral artery (HCC) 04/17/2015  . PVD (peripheral vascular disease) (HCC) 04/17/2015  . Overactive bladder 11/15/2014  . Weakness 10/24/2014  . Cerebral infarction due to thrombosis of right middle cerebral artery (HCC) 10/17/2014  . Monoplegia of left lower extremity (HCC) 08/16/2014  . Gait disturbance, post-stroke 08/16/2014  . Altered sensation due to stroke (HCC) 07/15/2014  . Hip pain, acute   . Bradycardia 07/01/2014  . Elevated troponin 07/01/2014  . Dysuria 07/01/2014  . Constipation 07/01/2014  . Right ankle pain 01/29/2014  . Rotator cuff  tear arthropathy of right shoulder 06/19/2013  . Left rotator cuff tear arthropathy 05/17/2013  . Aneurysm of iliac artery (HCC) 01/26/2012  . Left shoulder pain 02/09/2011  . Preventative health care 08/02/2010  . Sinus bradycardia 05/29/2010  . SICK SINUS SYNDROME 03/10/2010  . Occlusion and stenosis of carotid artery 03/10/2010  . PVD WITH CLAUDICATION 02/23/2010  . NECK MASS 10/01/2008  . DIVERTICULOSIS, COLON 08/01/2007  . SHOULDER PAIN, RIGHT 08/01/2007  . History of colonic polyps 08/01/2007  . GERD 12/25/2006  . Disorder of bone and cartilage 12/25/2006  . Hyperlipidemia 11/29/2006  . Essential hypertension 11/29/2006    Palliative Care Assessment & Plan  HPI: 83 y.o. female  with past medical history of dementia, CVA, chronic A. Fib (not anticoagulated d/t GI bleed), HTN, DM2, PVD, and GERD. She presented to ED with a week of progressive abdominal pain, nausea, and then onset of vomiting. She was admitted on 06/30/2016 with sepsis secondary to acute cholecystitis, and AKI. Pt was deemed not a surgical candidate, with subsequent placement of percutaneous cholecystostomy tube 3/30. AKI stable, with creatinine ~1.9 (baseline 0.5).  Since admission she has developed fluid overload issues, BNP 1,256, with concern for acute on chronic CHF. Furthermore, she has developed acute delirium with resultant poor oral intake. Palliative consulted to assist in goals of care.  Assessment: Tracy [name] is more awake today, however she continues to have significant delirium and minimal oral intake. I discussed the merits of preforming a MBS study today with speech, and agreed that the results may help direct goals of care conversations. She did have a MBS study today, and was found to have moderate aspiration risk, but cleared for puree diet with thin liquids.   I spoke with her daughter, Tracy [name], over the phone and gave her an update about the swallow study. We discussed the plan and she reiterated  that they wanted to give her another day or so to hopefully continue to improve with her mentation and her oral intake. We are planning to meet on Sunday for a repeat family meeting to discuss any clinical change.   Recommendations/Plan:  Continue supportive care and monitor oral intake  Plan to meet again with family on Sunday to discuss clinical status (I.e has she started eating or not)  Code Status:  DNR  Prognosis:   Unable to determine  Discharge Planning:  To Be Determined  Care plan was discussed with primary team and pt's daughter.  Thank you for allowing the Palliative Medicine Team to assist in the care of this patient.  Total time: 35 minutes    Greater than 50%  of this time was spent counseling and coordinating care related to the above assessment and plan.  Murrell Converse, NP Palliative Medicine Team [phone] pager (7a-5p) Team Phone # [phone]

## 2016-07-09 NOTE — Progress Notes (Signed)
  Speech Language Pathology Treatment: Dysphagia  Patient Details Name: Tracy [name] MRN: [id number] DOB: 1926/08/02 Today's Date: 07/09/2016 Time: 4098-1191 SLP Time Calculation (min) (ACUTE ONLY): 15 min  Assessment / Plan / Recommendation Clinical Impression  F/u at bedside for readiness to advance diet based on previous session progression/improvement 4/5. Patient alert but with continued poor po intake. Consumed 4-5 sips of thin liquid with max SLP encouragement with 1 x immediate cough response. Cough severely weak and likely ineffective to clear the airway. Audible rhonchi noted on exhalation.  Given fluctuations in swallowing function, recommend instrumental testing to determine least restrictive diet with lowest aspiration risk.  Discussed with palliative care team who is in agreement.    HPI HPI: Tracy [name] a 83 y.o.femalewith medical history significant of dementia, CVA, DM2, diverticulosis, DVT, gastric ulcer, GERD, HTN and chronic atrial fibrillation, not anticoagulated secondary to previous GI bleed. Patient came into the hospital because of abdominal pain, nausea and vomiting. CT scan was done and showed acute cholecystitis. Pt also admitted with sepsis, AKI, hypokalemia, afib with RVR, malnutrition. S/p percutaneous cholecystostomy placement. Seen for swallowing evaluation during prior admission, found to have grossly intact oropharyngeal swallowing fx, regular diet with thin liquids recommended (07/05/14).       SLP Plan  MBS       Recommendations  Diet recommendations: Dysphagia 1 (puree);Nectar-thick liquid Liquids provided via: Cup Medication Administration: Crushed with puree Supervision: Trained caregiver to feed patient Compensations: Slow rate;Small sips/bites;Minimize environmental distractions Postural Changes and/or Swallow Maneuvers: Seated upright 90 degrees                Oral Care Recommendations: Oral care BID Follow up Recommendations:  Other (comment) (TBD) SLP Visit Diagnosis: Dysphagia, unspecified (R13.10) Plan: MBS       GO              Tracy Lango MA, CCC-SLP [phone]   Tracy [name] Tracy [name] 07/09/2016, 9:56 AM

## 2016-07-09 NOTE — Progress Notes (Signed)
Patient only taken a few bites of pudding with her meds and a few sips of water with her meds.

## 2016-07-10 ENCOUNTER — Inpatient Hospital Stay (HOSPITAL_COMMUNITY): Payer: Medicare Other

## 2016-07-10 DIAGNOSIS — E876 Hypokalemia: Secondary | ICD-10-CM

## 2016-07-10 DIAGNOSIS — R630 Anorexia: Secondary | ICD-10-CM

## 2016-07-10 DIAGNOSIS — I482 Chronic atrial fibrillation: Secondary | ICD-10-CM

## 2016-07-10 LAB — BASIC METABOLIC PANEL
ANION GAP: 15 (ref 5–15)
BUN: 28 mg/dL — AB (ref 6–20)
CHLORIDE: 112 mmol/L — AB (ref 101–111)
CO2: 18 mmol/L — AB (ref 22–32)
Calcium: 8 mg/dL — ABNORMAL LOW (ref 8.9–10.3)
Creatinine, Ser: 1.76 mg/dL — ABNORMAL HIGH (ref 0.44–1.00)
GFR calc non Af Amer: 25 mL/min — ABNORMAL LOW (ref >60)
GFR, EST AFRICAN AMERICAN: 29 mL/min — AB (ref >60)
GLUCOSE: 72 mg/dL (ref 65–99)
POTASSIUM: 4.1 mmol/L (ref 3.5–5.1)
Sodium: 145 mmol/L (ref 135–145)

## 2016-07-10 MED ORDER — DILTIAZEM 12 MG/ML ORAL SUSPENSION
30.0000 mg | Freq: Four times a day (QID) | ORAL | Status: DC
  Administered 2016-07-10 – 2016-07-12 (×6): 30 mg
  Filled 2016-07-10 (×11): qty 3

## 2016-07-10 MED ORDER — METOPROLOL TARTRATE 25 MG/10 ML ORAL SUSPENSION
12.5000 mg | Freq: Two times a day (BID) | ORAL | Status: DC
  Administered 2016-07-10 – 2016-07-11 (×3): 12.5 mg
  Filled 2016-07-10 (×4): qty 10

## 2016-07-10 MED ORDER — THIAMINE HCL 100 MG/ML IJ SOLN
100.0000 mg | Freq: Every day | INTRAMUSCULAR | Status: DC
  Administered 2016-07-10: 100 mg via INTRAVENOUS
  Filled 2016-07-10: qty 2

## 2016-07-10 MED ORDER — JEVITY 1.2 CAL PO LIQD
1000.0000 mL | ORAL | Status: DC
  Filled 2016-07-10: qty 1000

## 2016-07-10 MED ORDER — POTASSIUM CHLORIDE 20 MEQ/15ML (10%) PO SOLN
20.0000 meq | Freq: Every day | ORAL | Status: DC
  Administered 2016-07-11 – 2016-07-12 (×2): 20 meq via ORAL
  Filled 2016-07-10 (×2): qty 15

## 2016-07-10 MED ORDER — ACETAMINOPHEN 160 MG/5ML PO SOLN
650.0000 mg | Freq: Four times a day (QID) | ORAL | Status: DC
  Administered 2016-07-10 – 2016-07-12 (×6): 650 mg
  Filled 2016-07-10 (×6): qty 20.3

## 2016-07-10 MED ORDER — ADULT MULTIVITAMIN LIQUID CH
15.0000 mL | Freq: Every day | ORAL | Status: DC
  Administered 2016-07-11 – 2016-07-12 (×2): 15 mL
  Filled 2016-07-10 (×3): qty 15

## 2016-07-10 MED ORDER — OLANZAPINE 5 MG PO TBDP
2.5000 mg | ORAL_TABLET | Freq: Every day | ORAL | Status: DC
  Administered 2016-07-10 – 2016-07-11 (×2): 2.5 mg via ORAL
  Filled 2016-07-10 (×4): qty 0.5

## 2016-07-10 MED ORDER — OSMOLITE 1.2 CAL PO LIQD
1000.0000 mL | ORAL | Status: DC
  Administered 2016-07-10: 1000 mL
  Filled 2016-07-10 (×4): qty 1000

## 2016-07-10 MED ORDER — FUROSEMIDE 40 MG PO TABS
40.0000 mg | ORAL_TABLET | Freq: Every day | ORAL | Status: DC
  Filled 2016-07-10: qty 1

## 2016-07-10 MED ORDER — FUROSEMIDE 10 MG/ML PO SOLN
40.0000 mg | Freq: Every day | ORAL | Status: DC
  Filled 2016-07-10 (×2): qty 4

## 2016-07-10 MED ORDER — ASPIRIN 81 MG PO CHEW
81.0000 mg | CHEWABLE_TABLET | Freq: Every day | ORAL | Status: DC
  Administered 2016-07-10 – 2016-07-12 (×3): 81 mg
  Filled 2016-07-10 (×4): qty 1

## 2016-07-10 MED ORDER — POTASSIUM CHLORIDE 20 MEQ PO PACK
20.0000 meq | PACK | Freq: Every day | ORAL | Status: DC
  Filled 2016-07-10: qty 1

## 2016-07-10 MED ORDER — FUROSEMIDE 40 MG PO TABS
40.0000 mg | ORAL_TABLET | Freq: Every day | ORAL | Status: DC
  Administered 2016-07-11 – 2016-07-12 (×3): 40 mg
  Filled 2016-07-10 (×3): qty 1

## 2016-07-10 NOTE — Progress Notes (Signed)
  Speech Language Pathology Treatment: Dysphagia  Patient Details Name: Tracy [name] MRN: [id number] DOB: 11-Dec-1926 Today's Date: 07/10/2016 Time: 4098-1191 SLP Time Calculation (min) (ACUTE ONLY): 25 min  Assessment / Plan / Recommendation Clinical Impression  Patient seen for dysphagia treatment. Daughters at bedside. Per RN pt has not been tolerating oral liquids, with lethargy. SLP elevated head of bed and utilized postural supports to maximize upright posture. Pt is alert, following basic commands. SLP provided teaspoon trials of jello. Pt with oral holding, with moderate verbal/ tactile cue with dry spoon swallows several teaspoons. Noted weak cough x1 following larger bolus and prolonged oral holding. Intermittent subtle throat clearing, however pt appears to manage smaller boluses via teaspoon without overt difficulty, with mild oral holding. She benefits from verbal cues and tactile/thermal cue of chilled metal spoon to cue swallow, reduce oral holding. Ice chips x3, teaspoons of thin water x3 with no overt signs of aspiration. Provided education to family regarding cuing strategies to reduce oral holding. Continue dysphagia 1 with thin liquids, full supervision and assistance for feeding. SLP will continue to follow.    HPI HPI: Tracy [name] a 83 y.o.femalewith medical history significant of dementia, CVA, DM2, diverticulosis, DVT, gastric ulcer, GERD, HTN and chronic atrial fibrillation, not anticoagulated secondary to previous GI bleed. Patient came into the hospital because of abdominal pain, nausea and vomiting. CT scan was done and showed acute cholecystitis. Pt also admitted with sepsis, AKI, hypokalemia, afib with RVR, malnutrition. S/p percutaneous cholecystostomy placement. Seen for swallowing evaluation during prior admission, found to have grossly intact oropharyngeal swallowing fx, regular diet with thin liquids recommended (07/05/14).       SLP Plan  Continue with  current plan of care       Recommendations  Diet recommendations: Dysphagia 1 (puree);Thin liquid Liquids provided via: Cup;Teaspoon Medication Administration: Crushed with puree Supervision: Trained caregiver to feed patient Compensations: Slow rate;Small sips/bites;Minimize environmental distractions (dry/chilled spoon to cue swallow) Postural Changes and/or Swallow Maneuvers: Seated upright 90 degrees                Oral Care Recommendations: Oral care BID Follow up Recommendations: Other (comment) SLP Visit Diagnosis: Dysphagia, oropharyngeal phase (R13.12) Plan: Continue with current plan of care       GO              Rondel Baton, [place] CF-SLP Speech-Language Pathologist [phone]  Arlana Lindau 07/10/2016, 2:00 PM

## 2016-07-10 NOTE — Progress Notes (Signed)
Patient not able to tolerate oral liquids at this time.  I spoke with daughter at bedside.  Patient has been lethargic all morning. With assistance from NT, attempted to sit patient in upright position and offered sip of ensure from spoon after arousing patient. Patient swallowed small 1/2 teaspoon. Patient unable to tolerate and has weak cough.  Meeting scheduled for today with family, daughter aware that I will not attempted anything else by mouth until goals of care have been discussed. Pt resting with call bell within reach.  Bed alarm on.  Will continue to monitor.

## 2016-07-10 NOTE — Progress Notes (Signed)
Daily Progress Note   Patient Name: Tracy [name]       Date: 07/10/2016 DOB: Aug 16, 1926  Age: 83 y.o. MRN#: [id number] Attending Physician: Maryruth Bun Rama, MD Primary Care Physician: Oliver Barre, MD Admit Date: 06/30/2016  Reason for Consultation/Follow-up: Disposition and Establishing goals of care  Subjective: Mrs. Been remains minimally interactive. She has intermittent social responses, but does not consistently follow simple commands or answer any questions. She seems more lethargic today as well. Minimal oral intake continues.   Length of Stay: 10  Current Medications: Scheduled Meds:  . acetaminophen (TYLENOL) oral liquid 160 mg/5 mL  650 mg Per Tube Q6H  . aspirin EC  81 mg Oral Daily  . diltiazem  30 mg Oral Q6H  . feeding supplement  1 Container Oral TID BM  . furosemide  40 mg Oral Daily  . heparin  5,000 Units Subcutaneous Q8H  . metoprolol tartrate  12.5 mg Oral BID  . multivitamin  15 mL Per Tube Daily  . OLANZapine zydis  2.5 mg Oral QHS  . pentoxifylline  400 mg Oral TID WC  . potassium chloride  20 mEq Oral Daily  . sodium chloride flush  3 mL Intravenous Q12H    Continuous Infusions:   PRN Meds: Oral care **AND** antiseptic oral rinse, ondansetron **OR** ondansetron (ZOFRAN) IV, RESOURCE THICKENUP CLEAR, traMADol  Physical Exam    Constitutional: She appears cachectic. She has a sickly appearance. Nasal cannula in place.  Lethargic Eyes: EOM are normal.  Neck: Normal range of motion. Neck supple.  Cardiovascular: An irregularly irregular rhythm present. Tachycardia present.   Pulmonary/Chest: No accessory muscle usage. Tachypnea noted. She has decreased breath sounds in the right lower field and the left lower field. She has no wheezes.  Abdominal: Soft. Bowel sounds are normal. There is no  tenderness.  Perc. Cholecystostomy in place. Genitourinary:  Genitourinary Comments: Foley Musculoskeletal: She exhibits edema (1+ BLE).  Significant weakness  Neurological: She is alert.  Socially interactive intermittently. Not following simple commands or consistently responding to questions. Skin: Skin is warm and dry.  Skin excoriation/erythema on bottom (improved)  Psychiatric: Her mood appears anxious. Her speech is tangential and slurred. She expresses impulsivity and inappropriate judgment.        Vital Signs: BP 107/71 (BP Location: Left Arm)   Pulse 76   Temp 97.8 F (36.6 C) (Oral)   Resp 20   Ht  (1.499 m)   Wt 43.7 kg (96 lb 4.8 oz)   SpO2 96%   BMI 19.45 kg/m  SpO2: SpO2: 96 % O2 Device: O2 Device: Nasal Cannula O2 Flow Rate: O2 Flow Rate (L/min): 2 L/min  Intake/output summary:   Intake/Output Summary (Last 24 hours) at 07/10/16 1304 Last data filed at 07/10/16 0649  Gross per 24 hour  Intake               55 ml  Output              900 ml  Net             -845 ml   LBM: Last BM Date: 07/09/16 Baseline Weight: Weight: 41.9 kg (92 lb 6.4 oz)  Most recent weight: Weight: 43.7 kg (96 lb 4.8 oz)  Palliative Assessment/Data: PPS 20%   Flowsheet Rows     Most Recent Value  Intake Tab  Referral Department  Hospitalist  Unit at Time of Referral  Cardiac/Telemetry Unit  Palliative Care Primary Diagnosis  Cardiac  Date Notified  07/07/16  Palliative Care Type  New Palliative care  Reason for referral  Clarify Goals of Care  Date of Admission  06/30/16  Date first seen by Palliative Care  07/08/16  # of days Palliative referral response time  1 Day(s)  # of days IP prior to Palliative referral  7  Clinical Assessment  Psychosocial & Spiritual Assessment  Palliative Care Outcomes      Patient Active Problem List   Diagnosis Date Noted  . Acute delirium   . Palliative care by specialist   . Goals of care, counseling/discussion   .  Protein-calorie malnutrition, severe 07/01/2016  . Acute cholecystitis 06/30/2016  . Sepsis (HCC) 06/30/2016  . Abdominal pain 06/19/2016  . Chest pain 06/19/2016  . Hyperglycemia 06/01/2016  . Long-term (current) use of anticoagulants 04/29/2016  . Acute GI bleeding 04/19/2016  . BRBPR (bright red blood per rectum) 04/19/2016  . Encounter for therapeutic drug monitoring 04/13/2016  . Dehydration   . Urine incontinence 03/11/2016  . Hypokalemia 03/11/2016  . Loss of appetite 03/11/2016  . Leukocytosis 03/11/2016  . AKI (acute kidney injury) (HCC) 03/11/2016  . Gastric ulcer 03/04/2016  . Acute blood loss anemia   . Acute on chronic diastolic CHF (congestive heart failure) (HCC)   . Dilated cardiomyopathy (HCC)   . Chronic atrial fibrillation (HCC)   . Pulmonary HTN   . Right lumbar radiculopathy 02/12/2016  . Irregular heart beats 02/04/2016  . Atrial fibrillation with RVR (HCC) 02/04/2016  . Anemia, iron deficiency 02/04/2016  . Hypercalcemia 02/04/2016  . Acute kidney injury (HCC) 02/04/2016  . Right sided sciatica 11/18/2015  . Trigger point of right shoulder region 05/13/2015  . Cerebrovascular accident (CVA) due to thrombosis of right posterior cerebral artery (HCC) 04/17/2015  . PVD (peripheral vascular disease) (HCC) 04/17/2015  . Overactive bladder 11/15/2014  . Weakness 10/24/2014  . Cerebral infarction due to thrombosis of right middle cerebral artery (HCC) 10/17/2014  . Monoplegia of left lower extremity (HCC) 08/16/2014  . Gait disturbance, post-stroke 08/16/2014  . Altered sensation due to stroke (HCC) 07/15/2014  . Bradycardia 07/01/2014  . Elevated troponin 07/01/2014  . Dysuria 07/01/2014  . Constipation 07/01/2014  . Rotator cuff tear arthropathy of right shoulder 06/19/2013  . Left rotator cuff tear arthropathy 05/17/2013  . Aneurysm of iliac artery (HCC) 01/26/2012  . Left shoulder pain 02/09/2011  . Preventative health care 08/02/2010  . Sinus  bradycardia 05/29/2010  . SICK SINUS SYNDROME 03/10/2010  . Occlusion and stenosis of carotid artery 03/10/2010  . PVD WITH CLAUDICATION 02/23/2010  . NECK MASS 10/01/2008  . DIVERTICULOSIS, COLON 08/01/2007  . SHOULDER PAIN, RIGHT 08/01/2007  . History of colonic polyps 08/01/2007  . GERD 12/25/2006  . Disorder of bone and cartilage 12/25/2006  . Hyperlipidemia 11/29/2006  . Essential hypertension 11/29/2006    Palliative Care Assessment & Plan   HPI: 83 y.o. female  with past medical history of dementia, CVA, chronic A. Fib (not anticoagulated d/t GI bleed), HTN, DM2, PVD, and GERD. She presented to ED with a week of progressive abdominal pain, nausea, and then onset of vomiting. She was admitted on 06/30/2016 with sepsis secondary to acute cholecystitis,  and AKI. Pt was deemed not a surgical candidate, with subsequent placement of percutaneous cholecystostomy tube 3/30. AKI stable, with creatinine ~1.9 (baseline 0.5).  Since admission she has developed fluid overload issues, BNP 1,256, with concern for acute on chronic CHF. Furthermore, she has developed acute delirium with resultant poor oral intake. Palliative consulted to assist in goals of care.  Assessment: Mrs. Fowle continues to struggle with delirium and has minimal change in her oral intake over the past 48 hours. I had another family meeting today, with two sons and two daughters present. I provided a re-cap of our last family meeting, and reiterated that the plan had been to see how she did over 24-48 hours with her oral intake. Her family today were very cognizant that she was not eating or drinking, but felt it may be due to weakness from poor oral intake over the past two weeks. I talked them through the difficult cycle of delirium--not eating--weakness; but they couldn't move past the idea that nutrition would give her strength and she would have the energy to eat and then improve. Along those lines, they were interested in  placing a feeding tube with the goal of giving her nutrition so that she could improve. They were hesitant about a feeding tube (PEG, specifically) long term, as they are overall focused on ensuring quality of life, but did want a means to provide nutrition in the short-term in hopes it would turn the situation around. With that goal in mind, I recommended a short trial of tube feeding via NG. That could then help clarify the role nutrition might be playing in her situation. We talked about undertaking the trial for a day or so, and then talking again on Monday. In the interim, they do want to continue encouraging PO intake and understand the aspiration risk.   Recommendations/Plan:  Cortrak and initiation of tube feeds per Dietician, as discussed with primary team  I will follow-up with family on Monday to re-assess and discuss next steps  Code Status:  DNR  Prognosis:   Unable to determine  Discharge Planning:  To Be Determined  Care plan was discussed with primary team and pt's four children.  Thank you for allowing the Palliative Medicine Team to assist in the care of this patient.  Time in: 1215 Time out: 1320 Total time: 75 minutes    Greater than 50%  of this time was spent counseling and coordinating care related to the above assessment and plan.  Murrell Converse, NP Palliative Medicine Team [phone] pager (7a-5p) Team Phone # [phone]

## 2016-07-10 NOTE — Progress Notes (Addendum)
Progress Note    Tracy [name]  ZOX:[id number] DOB: 1926/08/24  DOA: 06/30/2016 PCP: Oliver Barre, MD    Brief Narrative:   Chief complaint: Follow-up cholecystitis  Medical records reviewed and are as summarized below:  Tracy [name] is an 83 y.o. female with medical history significant of dementia, history of CVA and chronic atrial fibrillation, not anticoagulated secondary to previous GI bleed. Patient Presented to the hospital 06/30/16 because of abdominal pain, nausea and vomiting. HIDA scan showed acute cholecystitis, general surgery recommended cholecystostomy as she was not felt to be a surgical candidate. Cholecystostomy done on 07/02/16. Hospital course complicated by persistent mental status changes and poor oral intake. Mental status slowly improving.  Assessment/Plan:   Principal problem:  Sepsis secondary to acute cholecystitis Patient presented to the hospital with HR of 150, WBC of 20 and presence of infection (acute cholecystitis). There was evidence of hypoperfusion with elevated lactic acid of 2.3. CT scan showed acute cholecystitis. Gen. surgery consulted, recommended nonsurgical approach as patient has multiple comorbid conditions. HIDA scan was positive, IR placed percutaneous cholecystostomy 07/02/16. Was initially on Zosyn, subsequently switched to meropenem on 07/05/16 due to persistent leukocytosis. Sepsis physiology resolved, and meropenem subsequently discontinued 07/09/16. Blood cultures negative to date. Bile cultures with multiple organisms. Patient continues to be weak and deconditioned. Palliative care following. Continue percutaneous cholecystostomy drainage for 6-8 weeks. Will need to follow-up in the drain clinic after drain study performed per surgery recommendations.  Active problems:  Acute delirium/dementia This was likely secondary to pain medications, acute illness and dehydration in the background of dementia. Sedative medications discontinued,  only she is on Zyprexa at night. Mental status slowly improving, but not back to baseline yet.  AKI/pulmonary edema Baseline creatinine is 0.5 from 05/18/16. Current creatinine remains elevated over usual baseline values. Creatinine has been stable with diuresis however.  Hypokalemia Repleted. On routine supplementation.  Atrial fibrillation with RVR/acute on chronic diastolic CHF/pulmonary edema Continue Cardizem and Toprol-XL. CHA2DS2-VAScis likely greater than 6, (female, HTN, 2+ CVA and 2+ age). She is on low-dose aspirin, as anticoagulation is contraindicated secondary to recent significant GI bleed. Continue Cardizem/metoprolol for rate control. Developed significant dyspnea with evidence of decompensated CHF during hospital stay, status post 3 doses of IV Lasix 3 days. Now on oral Lasix, 40 mg daily. Weight has come down significantly. Respiratory status now stable.  Severe protein energy malnutrition Seen by dietitian with recommendations to place an NG tube for supplemental nutrition. Body mass index is 21.91 kg/m. Continues to have poor oral intake. Tube feedings initiated after placement of a NG tube. Dietitian following for recommendations.  Dysphagia ST following.  Had a modified barium swallow study and is at moderate risk for aspiration. Diet advanced to pured with thin liquids. S/P palliative care consult, family initially wanted to avoid placement of a feeding tube, but subsequently agreed to a short-term nasogastric tube to see if improved intake allowed her to make gains.   PVD Continue Trental.  Normocytic anemia Likely anemia of chronic disease. Hemoglobin stable.   Family Communication/Anticipated D/C date and plan/Code Status   DVT prophylaxis: Heparin. Code Status: DO NOT RESUSCITATE.  Family Communication: Son and daughter at bedside 07/08/16. Disposition Plan: Home versus SNF depending on progress. PT/OT recommends SNF.   Medical Consultants:     Surgery  Interventional Radiology  Palliative Care   Procedures:    Percutaneous cholecystostomy tube placement 07/02/16  MBS 07/09/16: Moderate risk for aspiration.  Anti-Infectives:  Zosyn 06/30/16 ---> 07/04/16 Meropenem 07/04/16---> 07/09/16   Subjective:   Awake and alert today, still feels bad, moans but unable to speak coherently enough for me to understand her.  Objective:    Vitals:   07/09/16 1945 07/09/16 2015 07/10/16 0500 07/10/16 0615  BP:  130/76 107/71   Pulse: 80 63 73 76  Resp:  20 20   Temp:  98.5 F (36.9 C) 97.8 F (36.6 C)   TempSrc:  Oral Oral   SpO2:  96% 96%   Weight:   43.7 kg (96 lb 4.8 oz)   Height:        Intake/Output Summary (Last 24 hours) at 07/10/16 0818 Last data filed at 07/10/16 0649  Gross per 24 hour  Intake               55 ml  Output              900 ml  Net             -845 ml   Filed Weights   07/08/16 0934 07/09/16 0402 07/10/16 0500  Weight: 46.4 kg (102 lb 4.8 oz) 46.7 kg (103 lb) 43.7 kg (96 lb 4.8 oz)    Exam: General exam: Appears uncomfortable with knitted brow. Respiratory system: Diminished. Respiratory effort much better.  Cardiovascular system: HSIR. No JVD,  No rubs, gallops or clicks. No murmurs. Gastrointestinal system: Abdomen is nondistended, soft and mildly tender. No organomegaly or masses felt. Normal bowel sounds heard. Cholecystomy drain with purulent fluid. Central nervous system: Alert and disoriented. No focal neurological deficits. Extremities: No clubbing,  or cyanosis. Edema resolved. Skin: No rashes, lesions or ulcers. Psychiatry: Judgement and insight appear impaired. Mood & affect anxious.   Data Reviewed:   I have personally reviewed following labs and imaging studies:  Labs: Basic Metabolic Panel:  Recent Labs Lab 07/06/16 0232 07/07/16 0241 07/08/16 0334 07/09/16 0235 07/10/16 0330  NA 140 141 143 144 145  K 3.9 3.9 3.9 3.3* 4.1  CL 112* 111 110 109 112*  CO2 18*  18* 20* 23 18*  GLUCOSE 76 84 67 106* 72  BUN 24* 25* 27* 27* 28*  CREATININE 1.81* 1.81* 1.92* 1.89* 1.76*  CALCIUM 8.2* 8.4* 8.4* 8.1* 8.0*   GFR Estimated Creatinine Clearance: 15.4 mL/min (A) (by C-G formula based on SCr of 1.76 mg/dL (H)). Liver Function Tests: No results for input(s): AST, ALT, ALKPHOS, BILITOT, PROT, ALBUMIN in the last 168 hours. No results for input(s): LIPASE, AMYLASE in the last 168 hours. No results for input(s): AMMONIA in the last 168 hours. Coagulation profile No results for input(s): INR, PROTIME in the last 168 hours.  CBC:  Recent Labs Lab 07/04/16 0214 07/05/16 0226 07/06/16 0232 07/07/16 0241  WBC 14.3* 17.5* 16.4* 12.2*  HGB 8.5* 8.1* 9.6* 9.8*  HCT 26.4* 25.4* 28.9* 30.4*  MCV 86.3 86.7 83.5 83.3  PLT 332 303 331 417*    Microbiology Recent Results (from the past 240 hour(s))  Aerobic/Anaerobic Culture (surgical/deep wound)     Status: Abnormal   Collection Time: 07/02/16  5:19 PM  Result Value Ref Range Status   Specimen Description DRAINAGE BILE  Final   Special Requests NONE  Final   Gram Stain   Final    ABUNDANT WBC PRESENT, PREDOMINANTLY PMN NO ORGANISMS SEEN    Culture (A)  Final    MULTIPLE ORGANISMS PRESENT, NONE PREDOMINANT NO ANAEROBES ISOLATED    Report Status 07/07/2016 FINAL  Final  Culture, blood (Routine X 2) w Reflex to ID Panel     Status: None   Collection Time: 07/04/16  2:31 PM  Result Value Ref Range Status   Specimen Description BLOOD LEFT HAND  Final   Special Requests   Final    IN PEDIATRIC BOTTLE Blood Culture results may not be optimal due to an inadequate volume of blood received in culture bottles   Culture NO GROWTH 5 DAYS  Final   Report Status 07/09/2016 FINAL  Final  Culture, blood (Routine X 2) w Reflex to ID Panel     Status: None   Collection Time: 07/04/16  2:32 PM  Result Value Ref Range Status   Specimen Description BLOOD LEFT HAND  Final   Special Requests   Final    IN PEDIATRIC  BOTTLE Blood Culture results may not be optimal due to an inadequate volume of blood received in culture bottles   Culture NO GROWTH 5 DAYS  Final   Report Status 07/09/2016 FINAL  Final    Radiology: Dg Swallowing Func-speech Pathology  Result Date: 07/09/2016 Objective Swallowing Evaluation: Type of Study: MBS-Modified Barium Swallow Study Patient Details Name: AKIA MONTALBAN MRN: [id number] Date of Birth: 17-Mar-1927 Today's Date: 07/09/2016 Time: SLP Start Time (ACUTE ONLY): 1040-SLP Stop Time (ACUTE ONLY): 1100 SLP Time Calculation (min) (ACUTE ONLY): 20 min Past Medical History: Past Medical History: Diagnosis Date . Atrial fibrillation (HCC)  . CAROTID STENOSIS  . CVA (cerebrovascular accident) (HCC) 2002 . DIABETES MELLITUS, TYPE II  . DIVERTICULOSIS, COLON  . DM (diabetes mellitus), type 2 (HCC)  . DVT (deep venous thrombosis) (HCC)  . Gastric ulcer  . GERD  . HTN (hypertension)  . Hx of adenomatous colonic polyps 2007 . Hyperlipidemia  . Hypertension  . OSTEOPENIA  . PVD (peripheral vascular disease) (HCC)   recently dx w/ carotid -left  int carotid >90% 50-60% rt int carotid 02/2010 . PVD (peripheral vascular disease) (HCC)   lf  ABI 0.11 and rt 0.43 . Sinus node dysfunction (HCC) 2011 . Stroke [facility] July 01, 2014  Affected Left side . Upper GI bleed 02/2016 Past Surgical History: Past Surgical History: Procedure Laterality Date . CAROTID ENDARTERECTOMY   . cataract eye surgery   . CNS vascular stent per Neurosurgeon   . ESOPHAGOGASTRODUODENOSCOPY (EGD) WITH PROPOFOL N/A 02/24/2016  Procedure: ESOPHAGOGASTRODUODENOSCOPY (EGD) WITH PROPOFOL;  Surgeon: Kathi Der, MD;  Location: [facility] ENDOSCOPY;  Service: Gastroenterology;  Laterality: N/A; . EYE SURGERY   . ILIAC ARTERY STENT  2011  Bilateral CIA stent . IR GENERIC HISTORICAL  07/02/2016  IR PERC CHOLECYSTOSTOMY 07/02/2016 Oley Balm, MD [facility]-INTER[facility] RAD . lazer eye surgery   . RADIOLOGY WITH ANESTHESIA N/A 03/20/2016  Procedure: MRI OF BRAIN;   Surgeon: Medication Radiologist, MD;  Location: [facility] OR;  Service: Radiology;  Laterality: N/A; . Right Shoulder Surgery  2010 . TUBAL LIGATION   HPI: Fayrene Towner Parsonis a 83 y.o.femalewith medical history significant of dementia, CVA, DM2, diverticulosis, DVT, gastric ulcer, GERD, HTN and chronic atrial fibrillation, not anticoagulated secondary to previous GI bleed. Patient came into the hospital because of abdominal pain, nausea and vomiting. CT scan was done and showed acute cholecystitis. Pt also admitted with sepsis, AKI, hypokalemia, afib with RVR, malnutrition. S/p percutaneous cholecystostomy placement. Seen for swallowing evaluation during prior admission, found to have grossly intact oropharyngeal swallowing fx, regular diet with thin liquids recommended (07/05/14).  Subjective: Pt lethargic, family present Assessment / Plan / Recommendation CHL IP CLINICAL IMPRESSIONS  07/09/2016 Clinical Impression Patient presents with a mild oropharyngeal dysphagia, cognitively based in nature, characterized by oral holding of bolus (worse with solids), delayed oral transit, and intermittently delayed swallow initiation. Combination of delayed swallow and mildly decreased laryngeal strength results in flash penetration of liquids but no frank penetration or aspiration. Aspiration risk remains moderate at this time given general deconditioning and cognitive status which fluctuates.  SLP Visit Diagnosis Dysphagia, oropharyngeal phase (R13.12) Attention and concentration deficit following -- Frontal lobe and executive function deficit following -- Impact on safety and function Moderate aspiration risk   CHL IP TREATMENT RECOMMENDATION 07/09/2016 Treatment Recommendations Therapy as outlined in treatment plan below   Prognosis 07/09/2016 Prognosis for Safe Diet Advancement Fair Barriers to Reach Goals Cognitive deficits Barriers/Prognosis Comment -- CHL IP DIET RECOMMENDATION 07/09/2016 SLP Diet Recommendations Dysphagia 1 (Puree)  solids;Thin liquid Liquid Administration via Cup;Straw Medication Administration Crushed with puree Compensations Slow rate;Small sips/bites;Minimize environmental distractions Postural Changes Seated upright at 90 degrees   CHL IP OTHER RECOMMENDATIONS 07/09/2016 Recommended Consults -- Oral Care Recommendations Oral care BID Other Recommendations --   CHL IP FOLLOW UP RECOMMENDATIONS 07/09/2016 Follow up Recommendations (No Data)   CHL IP FREQUENCY AND DURATION 07/09/2016 Speech Therapy Frequency (ACUTE ONLY) min 2x/week Treatment Duration 2 weeks      CHL IP ORAL PHASE 07/09/2016 Oral Phase Impaired Oral - Pudding Teaspoon -- Oral - Pudding Cup -- Oral - Honey Teaspoon -- Oral - Honey Cup -- Oral - Nectar Teaspoon -- Oral - Nectar Cup -- Oral - Nectar Straw -- Oral - Thin Teaspoon -- Oral - Thin Cup Delayed oral transit;Lingual/palatal residue Oral - Thin Straw Delayed oral transit;Lingual/palatal residue Oral - Puree Delayed oral transit;Lingual/palatal residue Oral - Mech Soft -- Oral - Regular -- Oral - Multi-Consistency -- Oral - Pill -- Oral Phase - Comment --  CHL IP PHARYNGEAL PHASE 07/09/2016 Pharyngeal Phase Impaired Pharyngeal- Pudding Teaspoon -- Pharyngeal -- Pharyngeal- Pudding Cup -- Pharyngeal -- Pharyngeal- Honey Teaspoon -- Pharyngeal -- Pharyngeal- Honey Cup -- Pharyngeal -- Pharyngeal- Nectar Teaspoon -- Pharyngeal -- Pharyngeal- Nectar Cup -- Pharyngeal -- Pharyngeal- Nectar Straw -- Pharyngeal -- Pharyngeal- Thin Teaspoon -- Pharyngeal -- Pharyngeal- Thin Cup Delayed swallow initiation-vallecula Pharyngeal -- Pharyngeal- Thin Straw Delayed swallow initiation-pyriform sinuses;Delayed swallow initiation-vallecula Pharyngeal -- Pharyngeal- Puree Delayed swallow initiation-vallecula Pharyngeal -- Pharyngeal- Mechanical Soft -- Pharyngeal -- Pharyngeal- Regular -- Pharyngeal -- Pharyngeal- Multi-consistency -- Pharyngeal -- Pharyngeal- Pill -- Pharyngeal -- Pharyngeal Comment --  CHL IP CERVICAL ESOPHAGEAL  PHASE 07/09/2016 Cervical Esophageal Phase WFL Pudding Teaspoon -- Pudding Cup -- Honey Teaspoon -- Honey Cup -- Nectar Teaspoon -- Nectar Cup -- Nectar Straw -- Thin Teaspoon -- Thin Cup -- Thin Straw -- Puree -- Mechanical Soft -- Regular -- Multi-consistency -- Pill -- Cervical Esophageal Comment -- Ferdinand Lango MA, CCC-SLP [phone] McCoy Leah Meryl 07/09/2016, 11:50 AM               Medications:   . acetaminophen  650 mg Oral Q6H  . aspirin EC  81 mg Oral Daily  . diltiazem  30 mg Oral Q6H  . feeding supplement  1 Container Oral TID BM  . furosemide  40 mg Oral Daily  . heparin  5,000 Units Subcutaneous Q8H  . metoprolol tartrate  12.5 mg Oral BID  . multivitamin  1 tablet Oral BID  . multivitamin with minerals  1 tablet Oral Daily  . OLANZapine zydis  2.5 mg Oral QHS  . pentoxifylline  400  mg Oral TID WC  . potassium chloride  20 mEq Oral BID  . sodium chloride flush  3 mL Intravenous Q12H   Continuous Infusions:  Medical decision making is of high complexity and this patient is at high risk of deterioration, therefore this is a level 3 visit.  (> 4 problem points, 2 data points, Moderate risk)    LOS: 10 days   RAMA,CHRISTINA  [facility] Pager [phone]. If unable to reach me by pager, please call my cell phone at [phone].  *Please refer to [email or web address], password [email or web address] to get updated schedule on who will round on this patient, as hospitalists switch teams weekly. If 7PM-7AM, please contact night-coverage at [email or web address], password [email or web address] for any overnight needs.  07/10/2016, 8:18 AM

## 2016-07-10 NOTE — Progress Notes (Addendum)
Nutrition Follow-up  DOCUMENTATION CODES:  Severe malnutrition in context of chronic illness  INTERVENTION:  Cortrak placed-  Initiate Osmolite 1.2 @ 15 ml/hr via NGT and increase by 10 ml every 8 hours to goal rate of 45 ml/hr. Recommend monitoring phos/k/mag x48 hrs. Patient severely malnourished, no intake x10-14 days atleast- at risk for refeeding syndrome  May be prudent to give IV thiamin injection given anticipated anabolic shift.    Tube feeding regimen provides 1296 kcal (100% of needs), 60 grams of protein, and 886 ml of H2O (+300 from flushes).   Flush w/ 100 cc q 8 hrs to provide additional 300 cc free water.   NUTRITION DIAGNOSIS:  Malnutrition (Severe ) related to chronic illness (CVA, dementia) as evidenced by severe depletion of muscle mass, severe depletion of body fat.  GOAL:  Patient will meet greater than or equal to 90% of their needs  MONITOR:  PO intake, Supplement acceptance, Labs, Weight trends, I & O's  REASON FOR ASSESSMENT:  Consult Enteral/tube feeding initiation and management  ASSESSMENT:  83 y.o. Female PMHx Dementia, DM2, CVA and chronic atrial fibrillation, not anticoagulated secondary to previous GI bleed. Patient came into the hospital because of abdominal pain, nausea and vomiting. All of the history obtained from daughter at bedside, patient started to have some abdominal pain and nausea about a week ago, started to have vomiting this morning and worsening abdominal pain so she came into the hospital for further evaluation.   RD following up day later as family has decided to be aggressive with care. Asked for feeding tube placement with initiation of TF  Admit weight is 92 lbs.   Per family member at bedside, the pt has not eaten in two weeks". -intake history confirms this statesment as she has eaten 0% of all meals since admission 10 days ago. She is profoundly cachectic and is at risk for refeeding syndrome.   Meds: MVI, lasix refused,  KCL Labs: Glu 60-110, Albumin: 1.9    Recent Labs Lab 07/08/16 0334 07/09/16 0235 07/10/16 0330  NA 143 144 145  K 3.9 3.3* 4.1  CL 110 109 112*  CO2 20* 23 18*  BUN 27* 27* 28*  CREATININE 1.92* 1.89* 1.76*  CALCIUM 8.4* 8.1* 8.0*  GLUCOSE 67 106* 72   Diet Order:  DIET - DYS 1 Room service appropriate? Yes; Fluid consistency: Thin Diet - low sodium heart healthy  Skin:  Bruises to buttocks, Stage 2 wound Last BM:  4/7  Height:  Ht Readings from Last 1 Encounters:  07/01/16  (1.499 m)   Weight:  Wt Readings from Last 1 Encounters:  07/10/16 96 lb 4.8 oz (43.7 kg)   Wt Readings from Last 10 Encounters:  07/10/16 96 lb 4.8 oz (43.7 kg)  06/01/16 91 lb (41.3 kg)  05/24/16 96 lb (43.5 kg)  05/18/16 93 lb (42.2 kg)  04/29/16 98 lb (44.5 kg)  04/22/16 98 lb 1.6 oz (44.5 kg)  04/16/16 96 lb 4 oz (43.7 kg)  03/22/16 98 lb 3.2 oz (44.5 kg)  03/04/16 99 lb (44.9 kg)  02/26/16 106 lb 11.2 oz (48.4 kg)  Admit weight 92 lbs (41.82 kg)  Ideal Body Weight:  44.6 kg  BMI:  Body mass index using admitted weight is 18.6 kg/m.  Estimated Nutritional Needs:  Kcal:  1300-1500 (31-36 kcal/kg bw) Protein:  55-65 (1.3-1.5/kg bw) Fluid:  >1.1 L (25 ml/kg bw)  EDUCATION NEEDS:  No education needs identified at this time  Harrold Donath  Chalmers Guest RD, LDN, CNSC Clinical Nutrition Pager: [phone] 07/10/2016 6:12 PM

## 2016-07-10 NOTE — Progress Notes (Signed)
MD recommended tube advanced. Reinserted stylette, this time w/o difficulty, advanced ~15 cm. Taped. Believed to believe distal stomach/postpyloric.   Reordered xray to confirm.   Christophe Louis RD, LDN, CNSC Clinical Nutrition Pager: [phone] 07/10/2016 5:44 PM

## 2016-07-11 LAB — GLUCOSE, CAPILLARY
GLUCOSE-CAPILLARY: 143 mg/dL — AB (ref 65–99)
GLUCOSE-CAPILLARY: 124 mg/dL — AB (ref 65–99)
Glucose-Capillary: 82 mg/dL (ref 65–99)
Glucose-Capillary: 127 mg/dL — ABNORMAL HIGH (ref 65–99)
Glucose-Capillary: 125 mg/dL — ABNORMAL HIGH (ref 65–99)

## 2016-07-11 LAB — BASIC METABOLIC PANEL
Anion gap: 13 (ref 5–15)
BUN: 29 mg/dL — ABNORMAL HIGH (ref 6–20)
CALCIUM: 8 mg/dL — AB (ref 8.9–10.3)
CO2: 24 mmol/L (ref 22–32)
CREATININE: 1.65 mg/dL — AB (ref 0.44–1.00)
Chloride: 113 mmol/L — ABNORMAL HIGH (ref 101–111)
GFR calc Af Amer: 31 mL/min — ABNORMAL LOW (ref >60)
GFR calc non Af Amer: 27 mL/min — ABNORMAL LOW (ref >60)
GLUCOSE: 81 mg/dL (ref 65–99)
Potassium: 3.9 mmol/L (ref 3.5–5.1)
Sodium: 150 mmol/L — ABNORMAL HIGH (ref 135–145)

## 2016-07-11 LAB — CBC
HCT: 29.8 % — ABNORMAL LOW (ref 36.0–46.0)
Hemoglobin: 9.1 g/dL — ABNORMAL LOW (ref 12.0–15.0)
MCH: 27 pg (ref 26.0–34.0)
MCHC: 30.5 g/dL (ref 30.0–36.0)
MCV: 88.4 fL (ref 78.0–100.0)
PLATELETS: 448 10*3/uL — AB (ref 150–400)
RBC: 3.37 MIL/uL — ABNORMAL LOW (ref 3.87–5.11)
RDW: 23.5 % — AB (ref 11.5–15.5)
WBC: 9 10*3/uL (ref 4.0–10.5)

## 2016-07-11 LAB — PHOSPHORUS: Phosphorus: 4.2 mg/dL (ref 2.5–4.6)

## 2016-07-11 LAB — MAGNESIUM: Magnesium: 1.6 mg/dL — ABNORMAL LOW (ref 1.7–2.4)

## 2016-07-11 MED ORDER — MORPHINE SULFATE (PF) 2 MG/ML IV SOLN
1.0000 mg | INTRAVENOUS | Status: DC | PRN
  Administered 2016-07-11 – 2016-07-12 (×2): 1 mg via INTRAVENOUS
  Filled 2016-07-11: qty 1

## 2016-07-11 MED ORDER — FREE WATER
100.0000 mL | Freq: Three times a day (TID) | Status: DC
  Administered 2016-07-11 – 2016-07-12 (×3): 100 mL

## 2016-07-11 MED ORDER — VITAMIN B-1 100 MG PO TABS
100.0000 mg | ORAL_TABLET | Freq: Every day | ORAL | Status: DC
  Administered 2016-07-12: 100 mg
  Filled 2016-07-11: qty 1

## 2016-07-11 MED ORDER — MORPHINE SULFATE (PF) 2 MG/ML IV SOLN
1.0000 mg | INTRAVENOUS | Status: DC | PRN
Start: 2016-07-11 — End: 2016-07-11
  Administered 2016-07-11 (×2): 1 mg via INTRAVENOUS
  Filled 2016-07-11 (×3): qty 1

## 2016-07-11 NOTE — Progress Notes (Signed)
PROGRESS NOTE    CADIENCE [name]  [name]:[id number] DOB: 1926-09-20 DOA: 06/30/2016 PCP: Oliver Barre, MD     Brief Narrative:  Tracy [name] is an 83 y.o. female with medical history significant of dementia, history of CVA and chronic atrial fibrillation, not anticoagulated secondary to previous GI bleed. Patient presented to the hospital on 06/30/16 because of abdominal pain, nausea and vomiting. HIDA scan showed acute cholecystitis, general surgery recommended cholecystostomy as she was not felt to be a surgical candidate. Cholecystostomy done on 07/02/16. Hospital course complicated by persistent mental status changes and poor oral intake. Palliative care has been consulted.   Assessment & Plan:   Principal Problem:   Sepsis (HCC) Active Problems:   Atrial fibrillation with RVR (HCC)   Acute on chronic diastolic CHF (congestive heart failure) (HCC)   Chronic atrial fibrillation (HCC)   Hypokalemia   Loss of appetite   AKI (acute kidney injury) (HCC)   Abdominal pain   Acute cholecystitis   Protein-calorie malnutrition, severe   Acute delirium   Palliative care by specialist   Goals of care, counseling/discussion  Sepsis secondary to acute cholecystitis Patient presented to the hospital with HR of 150, WBC of 20 and presence of infection (acute cholecystitis). There was evidence of hypoperfusion with elevated lactic acid of 2.3. CT scan showed acute cholecystitis. Gen. surgery consulted, recommended nonsurgical approach as patient has multiple comorbid conditions. HIDA scan was positive, IR placed percutaneous cholecystostomy 07/02/16. Was initially on Zosyn, subsequently switched to meropenem on 07/05/16 due to persistent leukocytosis. Sepsis physiology resolved, and meropenem subsequently discontinued 07/09/16. Blood cultures negative to date. Bile cultures with multiple organisms. Patient continues to be weak and deconditioned. Palliative care following. Continue percutaneous  cholecystostomy drainage for 6-8 weeks. Will need to follow-up in the drain clinic after drain study performed per surgery recommendations.  Acute delirium/dementia This was likely secondary to pain medications, acute illness and dehydration in the background of dementia. Sedative medications discontinued, only she is on Zyprexa at night.   AKI/pulmonary edema Baseline creatinine is 0.5 from 05/18/16. Current creatinine remains elevated over usual baseline values. Creatinine has been stable with diuresis however.  Hypernatremia  Start free water flushes through NG  Atrial fibrillation with RVR/acute on chronic diastolic CHF/pulmonary edema Continue Cardizem and Toprol-XL. CHA2DS2-VAScis likely greater than 6, (female, HTN, 2+ CVA and 2+ age). She is on low-dose aspirin, as anticoagulation is contraindicated secondary to recent significant GI bleed. Continue Cardizem/metoprolol for rate control. Developed significant dyspnea with evidence of decompensated CHF during hospital stay, status post 3 doses of IV Lasix 3 days. Now on oral Lasix, 40 mg daily. Respiratory status now stable.  Severe protein energy malnutrition Seen by dietitian with recommendations to place an NG tube for supplemental nutrition. Continues to have poor oral intake. Tube feedings initiated after placement of a NG tube. Dietitian following for recommendations.  Dysphagia ST following.  Had a modified barium swallow study and is at moderate risk for aspiration. Diet advanced to pured with thin liquids. S/P palliative care consult, family initially wanted to avoid placement of a feeding tube, but subsequently agreed to a short-term nasogastric tube to see if improved intake allowed her to make gains.   PVD Continue Trental.  Normocytic anemia Likely anemia of chronic disease. Hemoglobin stable.   DVT prophylaxis: heparin subq Code Status: DNR Family Communication: no family at bedside Disposition Plan:  Pending further family meeting with palliative care on Monday   Consultants:   General surgery  IR  Palliative care  Procedures:   Percutaneous cholecystostomy tube placement 07/02/16  MBS 07/09/16: Moderate risk for aspiration.  Antimicrobials:   Zosyn 06/30/16 ---> 07/04/16  Meropenem 07/04/16---> 07/09/16   Subjective: Patient awakens to verbal stimuli but does not speak coherently or participates in exam.   Objective: Vitals:   07/10/16 2035 07/11/16 0237 07/11/16 0407 07/11/16 1327  BP: 128/71  133/66 112/71  Pulse: (!) 57  64 84  Resp: Temp: 97.6 F (36.4 C)  98.1 F (36.7 C) 98.3 F (36.8 C)  TempSrc: Axillary  Oral Oral  SpO2: 93%  94% 92%  Weight:   47.4 kg (104 lb 9.6 oz)   Height:        Intake/Output Summary (Last 24 hours) at 07/11/16 1330 Last data filed at 07/11/16 0915  Gross per 24 hour  Intake               80 ml  Output              840 ml  Net             -760 ml   Filed Weights   07/09/16 0402 07/10/16 0500 07/11/16 0407  Weight: 46.7 kg (103 lb) 43.7 kg (96 lb 4.8 oz) 47.4 kg (104 lb 9.6 oz)    Examination:  General exam: Appears calm, sleeping  Respiratory system: Diminished. Respiratory effort normal. Cardiovascular system: S1 & S2 heard, Irreg rhythm. No JVD, murmurs, rubs, gallops or clicks. No pedal edema. Gastrointestinal system: Abdomen is nondistended, soft and +TTP RUQ. +Cholecystomy drain with purulent fluid  Central nervous system: Alert to voice, not oriented Extremities: Symmetric . Skin: No rashes, lesions or ulcers on exposed skin   Data Reviewed: I have personally reviewed following labs and imaging studies  CBC:  Recent Labs Lab 07/05/16 0226 07/06/16 0232 07/07/16 0241 07/11/16 0349  WBC 17.5* 16.4* 12.2* 9.0  HGB 8.1* 9.6* 9.8* 9.1*  HCT 25.4* 28.9* 30.4* 29.8*  MCV 86.7 83.5 83.3 88.4  PLT 303 331 417* 448*   Basic Metabolic Panel:  Recent Labs Lab 07/07/16 0241 07/08/16 0334  07/09/16 0235 07/10/16 0330 07/11/16 0349  NA 141 143 144 145 150*  K 3.9 3.9 3.3* 4.1 3.9  CL 111 110 109 112* 113*  CO2 18* 20* 23 18* 24  GLUCOSE 84 67 106* 72 81  BUN 25* 27* 27* 28* 29*  CREATININE 1.81* 1.92* 1.89* 1.76* 1.65*  CALCIUM 8.4* 8.4* 8.1* 8.0* 8.0*  MG  --   --   --   --  1.6*  PHOS  --   --   --   --  4.2   GFR: Estimated Creatinine Clearance: 16.4 mL/min (A) (by C-G formula based on SCr of 1.65 mg/dL (H)). Liver Function Tests: No results for input(s): AST, ALT, ALKPHOS, BILITOT, PROT, ALBUMIN in the last 168 hours. No results for input(s): LIPASE, AMYLASE in the last 168 hours. No results for input(s): AMMONIA in the last 168 hours. Coagulation Profile: No results for input(s): INR, PROTIME in the last 168 hours. Cardiac Enzymes: No results for input(s): CKTOTAL, CKMB, CKMBINDEX, TROPONINI in the last 168 hours. BNP (last 3 results) No results for input(s): PROBNP in the last 8760 hours. HbA1C: No results for input(s): HGBA1C in the last 72 hours. CBG:  Recent Labs Lab 07/11/16 0400 07/11/16 0822 07/11/16 1208  GLUCAP 82 127* 125*   Lipid Profile: No results for input(s): CHOL, HDL, LDLCALC,  TRIG, CHOLHDL, LDLDIRECT in the last 72 hours. Thyroid Function Tests: No results for input(s): TSH, T4TOTAL, FREET4, T3FREE, THYROIDAB in the last 72 hours. Anemia Panel: No results for input(s): VITAMINB12, FOLATE, FERRITIN, TIBC, IRON, RETICCTPCT in the last 72 hours. Sepsis Labs: No results for input(s): PROCALCITON, LATICACIDVEN in the last 168 hours.  Recent Results (from the past 240 hour(s))  Aerobic/Anaerobic Culture (surgical/deep wound)     Status: Abnormal   Collection Time: 07/02/16  5:19 PM  Result Value Ref Range Status   Specimen Description DRAINAGE BILE  Final   Special Requests NONE  Final   Gram Stain   Final    ABUNDANT WBC PRESENT, PREDOMINANTLY PMN NO ORGANISMS SEEN    Culture (A)  Final    MULTIPLE ORGANISMS PRESENT, NONE  PREDOMINANT NO ANAEROBES ISOLATED    Report Status 07/07/2016 FINAL  Final  Culture, blood (Routine X 2) w Reflex to ID Panel     Status: None   Collection Time: 07/04/16  2:31 PM  Result Value Ref Range Status   Specimen Description BLOOD LEFT HAND  Final   Special Requests   Final    IN PEDIATRIC BOTTLE Blood Culture results may not be optimal due to an inadequate volume of blood received in culture bottles   Culture NO GROWTH 5 DAYS  Final   Report Status 07/09/2016 FINAL  Final  Culture, blood (Routine X 2) w Reflex to ID Panel     Status: None   Collection Time: 07/04/16  2:32 PM  Result Value Ref Range Status   Specimen Description BLOOD LEFT HAND  Final   Special Requests   Final    IN PEDIATRIC BOTTLE Blood Culture results may not be optimal due to an inadequate volume of blood received in culture bottles   Culture NO GROWTH 5 DAYS  Final   Report Status 07/09/2016 FINAL  Final       Radiology Studies: Dg Abd Portable 1v  Result Date: 07/10/2016 CLINICAL DATA:  Repositioning of feeding tube. EXAM: PORTABLE ABDOMEN - 1 VIEW COMPARISON:  Prior today FINDINGS: A feeding tube is seen with tip now overlying the expected location of the pylorus or duodenal bulb. Residual oral contrast material is seen within the colon, with severe sigmoid diverticulosis noted. No evidence of bowel obstruction. Percutaneous cholecystostomy tube remains in place. Opacification of left lung base again noted. IMPRESSION: Feeding tube tip now overlies the pylorus or duodenal bulb. Electronically Signed   By: Myles Rosenthal M.D.   On: 07/10/2016 18:17   Dg Abd Portable 1v  Result Date: 07/10/2016 CLINICAL DATA:  Feeding tube placement. EXAM: PORTABLE ABDOMEN - 1 VIEW COMPARISON:  Chest x-ray July 05, 2016 FINDINGS: Opacification of the left lower chest again identified. Probable small layering effusion on the right. The distal tip of the feeding tube projects over the left upper quadrant of the abdomen,  probably just within the stomach. Recommend advancement/ repositioning before use. A pigtail catheter seen in the right mid abdomen. A vascular stent is seen in the left iliac region. Contrast is seen in the colon with colonic diverticulosis. IMPRESSION: The feeding tube likely terminates in the stomach just below the GE junction. Recommend repositioning/ advancement before use. Electronically Signed   By: Gerome Sam III M.D   On: 07/10/2016 17:13      Scheduled Meds: . acetaminophen (TYLENOL) oral liquid 160 mg/5 mL  650 mg Per Tube Q6H  . aspirin  81 mg Per Tube Daily  .  diltiazem  30 mg Per Tube Q6H  . feeding supplement (OSMOLITE 1.2 CAL)  1,000 mL Per Tube Q24H  . free water  100 mL Per Tube Q8H  . furosemide  40 mg Per Tube Daily  . heparin  5,000 Units Subcutaneous Q8H  . metoprolol tartrate  12.5 mg Per Tube BID  . multivitamin  15 mL Per Tube Daily  . OLANZapine zydis  2.5 mg Oral QHS  . potassium chloride  20 mEq Oral Daily  . sodium chloride flush  3 mL Intravenous Q12H  . [START ON 07/12/2016] thiamine  100 mg Per Tube Daily   Continuous Infusions:   LOS: 11 days    Time spent: 40 minutes   Noralee Stain, DO [facility] [email or web address] Password [email or web address] 07/11/2016, 1:30 PM

## 2016-07-12 LAB — BASIC METABOLIC PANEL
Anion gap: 11 (ref 5–15)
BUN: 29 mg/dL — ABNORMAL HIGH (ref 6–20)
CHLORIDE: 116 mmol/L — AB (ref 101–111)
CO2: 23 mmol/L (ref 22–32)
CREATININE: 1.71 mg/dL — AB (ref 0.44–1.00)
Calcium: 7.8 mg/dL — ABNORMAL LOW (ref 8.9–10.3)
GFR calc Af Amer: 30 mL/min — ABNORMAL LOW (ref >60)
GFR calc non Af Amer: 26 mL/min — ABNORMAL LOW (ref >60)
GLUCOSE: 154 mg/dL — AB (ref 65–99)
Potassium: 4 mmol/L (ref 3.5–5.1)
SODIUM: 150 mmol/L — AB (ref 135–145)

## 2016-07-12 LAB — GLUCOSE, CAPILLARY
GLUCOSE-CAPILLARY: 131 mg/dL — AB (ref 65–99)
Glucose-Capillary: 139 mg/dL — ABNORMAL HIGH (ref 65–99)
Glucose-Capillary: 127 mg/dL — ABNORMAL HIGH (ref 65–99)

## 2016-07-12 LAB — PHOSPHORUS: Phosphorus: 4.4 mg/dL (ref 2.5–4.6)

## 2016-07-12 LAB — MAGNESIUM: Magnesium: 1.7 mg/dL (ref 1.7–2.4)

## 2016-07-12 MED ORDER — BIOTENE DRY MOUTH MT LIQD: 15.0000 mL | OROMUCOSAL | Status: DC | PRN

## 2016-07-12 MED ORDER — FREE WATER
100.0000 mL | Freq: Four times a day (QID) | Status: DC
  Administered 2016-07-12: 100 mL

## 2016-07-12 MED ORDER — POLYVINYL ALCOHOL 1.4 % OP SOLN
1.0000 [drp] | Freq: Four times a day (QID) | OPHTHALMIC | Status: DC | PRN
  Filled 2016-07-12: qty 15

## 2016-07-12 MED ORDER — HALOPERIDOL LACTATE 5 MG/ML IJ SOLN: 0.5000 mg | INTRAMUSCULAR | Status: DC | PRN

## 2016-07-12 MED ORDER — GLYCOPYRROLATE 0.2 MG/ML IJ SOLN
0.2000 mg | INTRAMUSCULAR | Status: DC | PRN
  Filled 2016-07-12: qty 1

## 2016-07-12 MED ORDER — FENTANYL CITRATE (PF) 100 MCG/2ML IJ SOLN
12.5000 ug | INTRAMUSCULAR | Status: DC | PRN
  Administered 2016-07-12 (×3): 12.5 ug via INTRAVENOUS
  Filled 2016-07-12 (×3): qty 2

## 2016-07-12 NOTE — Progress Notes (Signed)
Daily Progress Note   Patient Name: Tracy [name]       Date: 07/12/2016 DOB: 1927/02/01  Age: 83 y.o. MRN#: [id number] Attending Physician: Shon Millet* Primary Care Physician: Cathlean Cower, MD Admit Date: 06/30/2016  Reason for Consultation/Follow-up: Disposition and Establishing goals of care  Subjective: Mrs. Mikaelian started NG tube feeds on 4/7. Overnight she had periods of moaning with concern for pain and was given PRN morphine (the last dose was 1am this morning). Today, she is somnolent and unable to be consistently woken. She will open her eyes briefly to physical stimuli, but quickly falls back asleep. Jonathon Bellows is at her bedside, with Tye Maryland on her way here.  Length of Stay: 12  Current Medications: Scheduled Meds:  . acetaminophen (TYLENOL) oral liquid 160 mg/5 mL  650 mg Per Tube Q6H  . aspirin  81 mg Per Tube Daily  . diltiazem  30 mg Per Tube Q6H  . feeding supplement (OSMOLITE 1.2 CAL)  1,000 mL Per Tube Q24H  . free water  100 mL Per Tube Q6H  . furosemide  40 mg Per Tube Daily  . heparin  5,000 Units Subcutaneous Q8H  . metoprolol tartrate  12.5 mg Per Tube BID  . multivitamin  15 mL Per Tube Daily  . OLANZapine zydis  2.5 mg Oral QHS  . potassium chloride  20 mEq Oral Daily  . sodium chloride flush  3 mL Intravenous Q12H  . thiamine  100 mg Per Tube Daily    Continuous Infusions:   PRN Meds: Oral care **AND** antiseptic oral rinse, fentaNYL (SUBLIMAZE) injection, [DISCONTINUED] ondansetron **OR** ondansetron (ZOFRAN) IV  Physical Exam    Constitutional: She appears cachectic. She has a sickly appearance. Nasal cannula in place.  Somnolent Neck: Normal range of motion. Neck supple.  Cardiovascular: An irregularly irregular rhythm present.  Pulmonary/Chest: No accessory muscle usage. Tachypnea  noted (RR 20). She has decreased breath sounds in the right lower field and the left lower field. She has no wheezes.  Abdominal: Soft. Bowel sounds are normal. There is no tenderness.  Perc. Cholecystostomy in place. Genitourinary:  Genitourinary Comments: Foley Musculoskeletal:   UTA d/t somnolence and unable to follow commands Neurological: She is alert.  Somnolent. Will briefly open her eyes to physical stimuli but quickly falls back asleep. Skin: Skin is warm and dry.  Psychiatric:  UTA d/t somnolence    Vital Signs: BP (!) 94/54 (BP Location: Right Arm)   Pulse 68   Temp 97.6 F (36.4 C) (Axillary)   Resp 16   Ht _0  (1.499 m)   Wt 47.8 kg (105 lb 6.1 oz)   SpO2 90%   BMI 21.28 kg/m  SpO2: SpO2: 90 % O2 Device: O2 Device: Not Delivered O2 Flow Rate: O2 Flow Rate (L/min): 2 L/min  Intake/output summary:   Intake/Output Summary (Last 24 hours) at 07/12/16 1024 Last data filed at 07/12/16 0800  Gross per 24 hour  Intake           751.17 ml  Output              450 ml  Net           301.17  ml   LBM: Last BM Date: 07/11/16 Baseline Weight: Weight: 41.9 kg (92 lb 6.4 oz) Most recent weight: Weight: 47.8 kg (105 lb 6.1 oz)  Palliative Assessment/Data: PPS 10%   Flowsheet Rows     Most Recent Value  Intake Tab  Referral Department  Hospitalist  Unit at Time of Referral  Cardiac/Telemetry Unit  Palliative Care Primary Diagnosis  Cardiac  Date Notified  07/07/16  Palliative Care Type  New Palliative care  Reason for referral  Clarify Goals of Care  Date of Admission  06/30/16  Date first seen by Palliative Care  07/08/16  # of days Palliative referral response time  1 Day(s)  # of days IP prior to Palliative referral  7  Clinical Assessment  Psychosocial & Spiritual Assessment  Palliative Care Outcomes      Patient Active Problem List   Diagnosis Date Noted  . Acute delirium   . Palliative care by specialist   . Goals of care, counseling/discussion     . Protein-calorie malnutrition, severe 07/01/2016  . Acute cholecystitis 06/30/2016  . Sepsis ([place]) 06/30/2016  . Abdominal pain 06/19/2016  . Chest pain 06/19/2016  . Hyperglycemia 06/01/2016  . Long-term (current) use of anticoagulants 04/29/2016  . Acute GI bleeding 04/19/2016  . BRBPR (bright red blood per rectum) 04/19/2016  . Encounter for therapeutic drug monitoring 04/13/2016  . Dehydration   . Urine incontinence 03/11/2016  . Hypokalemia 03/11/2016  . Loss of appetite 03/11/2016  . Leukocytosis 03/11/2016  . AKI (acute kidney injury) ([place]) 03/11/2016  . Gastric ulcer 03/04/2016  . Acute blood loss anemia   . Acute on chronic diastolic CHF (congestive heart failure) ([place])   . Dilated cardiomyopathy ([place])   . Chronic atrial fibrillation ([place])   . Pulmonary HTN   . Right lumbar radiculopathy 02/12/2016  . Irregular heart beats 02/04/2016  . Atrial fibrillation with RVR ([place]) 02/04/2016  . Anemia, iron deficiency 02/04/2016  . Hypercalcemia 02/04/2016  . Acute kidney injury ([place]) 02/04/2016  . Right sided sciatica 11/18/2015  . Trigger point of right shoulder region 05/13/2015  . Cerebrovascular accident (CVA) due to thrombosis of right posterior cerebral artery ([place]) 04/17/2015  . PVD (peripheral vascular disease) ([place]) 04/17/2015  . Overactive bladder 11/15/2014  . Weakness 10/24/2014  . Cerebral infarction due to thrombosis of right middle cerebral artery ([place]) 10/17/2014  . Monoplegia of left lower extremity ([place]) 08/16/2014  . Gait disturbance, post-stroke 08/16/2014  . Altered sensation due to stroke ([place]) 07/15/2014  . Bradycardia 07/01/2014  . Elevated troponin 07/01/2014  . Dysuria 07/01/2014  . Constipation 07/01/2014  . Rotator cuff tear arthropathy of right shoulder 06/19/2013  . Left rotator cuff tear arthropathy 05/17/2013  . Aneurysm of iliac artery ([place]) 01/26/2012  . Left shoulder pain 02/09/2011  . Preventative health care 08/02/2010  . Sinus  bradycardia 05/29/2010  . SICK SINUS SYNDROME 03/10/2010  . Occlusion and stenosis of carotid artery 03/10/2010  . PVD WITH CLAUDICATION 02/23/2010  . NECK MASS 10/01/2008  . DIVERTICULOSIS, COLON 08/01/2007  . SHOULDER PAIN, RIGHT 08/01/2007  . History of colonic polyps 08/01/2007  . GERD 12/25/2006  . Disorder of bone and cartilage 12/25/2006  . Hyperlipidemia 11/29/2006  . Essential hypertension 11/29/2006    Palliative Care Assessment & Plan   HPI: 83 y.o. female  with past medical history of dementia, CVA, chronic A. Fib (not anticoagulated d/t GI bleed), HTN, DM2, PVD, and GERD. She presented to ED with a week of progressive abdominal  pain, nausea, and then onset of vomiting. She was admitted on 06/30/2016 with sepsis secondary to acute cholecystitis, and AKI. Pt was deemed not a surgical candidate, with subsequent placement of percutaneous cholecystostomy tube 3/30. AKI stable, with creatinine ~1.9 (baseline 0.5).  Since admission she has developed fluid overload issues, BNP 1,256, with concern for acute on chronic CHF. Furthermore, she has developed acute delirium with resultant poor oral intake. Palliative consulted to assist in goals of care.  Assessment: Mrs. Hintze continues to show no improvement and, in fact, appears worse today despite time and NG tube feeding. I have met with her family multiple times to provide support and discuss care trajectory. Up to this point, they were focused on ensuring adequate time was given, and reversible things were attempted. We have ensured infection management, pain management, delirium supportive measures in place (clock in view, blinds open, frequent reorientation, allowance for normal sleep/wake cycle, limit deliriogenic medications), and started NG tube feeds for nutrition support. She, unfortunately, is still not eating or drinking and is demonstrating increased lethargy.   I met with Jonathon Bellows and Tye Maryland at her bedside again today to discuss  her clinical picture and options for moving forward. At this juncture the options are to proceed with placement of a feeding tube versus shifting to comfort care. After discussion, her family elected to pursue comfort care and transition to residential hospice. I talked to them about focusing our care on her, and not being concerned with the numbers (like labs or vitals). To that end, I discussed stopping the tube feeds and removing the NG tube and changing her medications to be focused entirely around ensuring comfort. We also discussed residential hospice options and they wanted her to be in [place] if possible.   Recommendations/Plan:  DNR, transition to full comfort care and transition to residential hospice  SW consulted for residential hospice referral   Orders adjusted for comfort:   Will remove NG tube, limit VS to daily, no labs  Continue cholecystostomy tube as this is a comfort measure  Code Status:  DNR  Prognosis:   < 2 weeks; pt with no oral intake and increased lethargy.   Discharge Planning:  Hospice facility  Care plan was discussed with primary team, Alphonzo Lemmings, and care nurse  Thank you for allowing the Palliative Medicine Team to assist in the care of this patient.  Time in: 1030/1045; 1145/1225 Total time: 65 minutes    Greater than 50%  of this time was spent counseling and coordinating care related to the above assessment and plan.  Charlynn Court, NP Palliative Medicine Team [phone] pager (7a-5p) Team Phone # [phone]

## 2016-07-12 NOTE — Consult Note (Signed)
   [facility] CM Inpatient Consult   07/12/2016  Tracy [name] 1926/07/25 [id number]    [facility] Care Management screening follow up. Chart reviewed and confirmed with inpatient RNCM that discharge plan is hospice facility. Therefore, [facility] Care Management program not appropriate at this time.   Raiford Noble, MSN-Ed, RN,BSN [facility] Liaison [phone]

## 2016-07-12 NOTE — Progress Notes (Signed)
Nutrition Brief Note  Chart reviewed. Pt now transitioning to comfort care.  No further nutrition interventions warranted at this time.  Please re-consult as needed.   Elzie Knisley A. Rushton Early, RD, LDN, CDE Pager: [phone] After hours Pager: [phone]  

## 2016-07-12 NOTE — Discharge Summary (Signed)
Physician Discharge Summary  Tracy [name]:[id number] DOB: 06-12-1926 DOA: 06/30/2016  PCP: Oliver Barre, MD  Admit date: 06/30/2016 Discharge date: 07/12/2016  Admitted From: Home Disposition:  Hospice  Discharge Condition: Terminal CODE STATUS: DNR  Diet recommendation: General, comfort feed   Brief/Interim Summary: Tracy [name] an 83 y.o.femalewith medical history significant of dementia, history of CVA and chronic atrial fibrillation, not anticoagulated secondary to previous GI bleed. Patient presented to the hospital on 06/30/16 because of abdominal pain, nausea and vomiting. HIDA scan showed acute cholecystitis, general surgery recommended cholecystostomy as she was not felt to be a surgical candidate. Cholecystostomy done on 07/02/16. Hospital course complicated by persistent mental status changes and poor oral intake. Palliative care has been consulted. See below for further details.   Discharge Diagnoses:  Principal Problem:   Sepsis (HCC) Active Problems:   Atrial fibrillation with RVR (HCC)   Acute on chronic diastolic CHF (congestive heart failure) (HCC)   Chronic atrial fibrillation (HCC)   Hypokalemia   Loss of appetite   AKI (acute kidney injury) (HCC)   Abdominal pain   Acute cholecystitis   Protein-calorie malnutrition, severe   Acute delirium   Palliative care by specialist   Goals of care, counseling/discussion   Sepsis secondary to acute cholecystitis Patient presented to the hospital with HR of 150, WBC of 20 and presence of infection (acute cholecystitis). There was evidence of hypoperfusion with elevated lactic acid of 2.3. CT scan showed acute cholecystitis. Gen. surgery consulted, recommended nonsurgical approach as patient has multiple comorbid conditions. HIDA scan was positive, IR placed percutaneous cholecystostomy 07/02/16. Was initially on Zosyn, subsequentlyswitched to meropenem on 07/05/16 due topersistent leukocytosis. Sepsis physiology  resolved, and meropenem subsequently discontinued 07/09/16. Blood cultures negative to date. Bile cultures with multiple organisms. Patient continues to be weak and deconditioned. Palliative care following. Initial plan was for percutaneous cholecystostomy drainage for 6-8 weeks and follow up as outpatient, but patient now transitioning to comfort care  Acute delirium/dementia This was likely secondary to pain medications, acute illness and dehydration in the background of dementia. Sedative medications discontinued, only she is on Zyprexa at night. Patient more somnolent this morning.   AKI/pulmonary edema Baseline creatinine is 0.5 from 05/18/16. Current creatinine remains elevated over usual baseline values. Creatinine has been stable.   Hypernatremia  No improvement with free water flushes  Atrial fibrillation with RVR/acute on chronic diastolic CHF/pulmonary edema CHA2DS2-VAScis likely greater than 6, (female, HTN, 2+ CVA and 2+ age). She is on low-dose aspirin, as anticoagulation is contraindicated secondary to recent significant GI bleed. Now comfort care, stop meds.   Severe protein energy malnutrition Seen by dietitian with recommendations to place an NG tube for supplemental nutrition. Continues to have poor oral intake. Tube feedings initiated after placement of a NG tube. Dietitian following for recommendations. Now comfort care, NGT removed.   Dysphagia ST following. Had a modified barium swallow study and is at moderate risk for aspiration. Diet advanced to pured with thin liquids. S/P palliative care consult, family initially wantedto avoid placement of a feeding tube, but subsequently agreed to a short-term nasogastric tube to see if improved intake allowed her to make gains. No improvement in nutrition, now comfort care, NGT removed   PVD Now comfort care, stop meds.   Normocytic anemia Likely anemia of chronic disease. Hemoglobin stable.   Discharge  Instructions  Discharge Instructions    Call MD for:  persistant nausea and vomiting    Complete by:  As directed  Call MD for:  redness, tenderness, or signs of infection (pain, swelling, redness, odor or green/yellow discharge around incision site)    Complete by:  As directed    Call MD for:  severe uncontrolled pain    Complete by:  As directed    Call MD for:  temperature >100.4    Complete by:  As directed    Diet - low sodium heart healthy    Complete by:  As directed    Discharge instructions    Complete by:  As directed    Follow up with Interventional Radiology after discharge in 6-8 weeks.  Record drain output daily and bring log of output to clinic appointment.   Increase activity slowly    Complete by:  As directed      Allergies as of 07/12/2016   No Known Allergies     Medication List    STOP taking these medications   aspirin 81 MG EC tablet   atorvastatin 10 MG tablet Commonly known as:  LIPITOR   diltiazem 180 MG 24 hr capsule Commonly known as:  CARDIZEM CD   donepezil 5 MG tablet Commonly known as:  ARICEPT   fenofibrate micronized 134 MG capsule Commonly known as:  LOFIBRA   ferrous sulfate 325 (65 FE) MG tablet   hydrochlorothiazide 12.5 MG capsule Commonly known as:  MICROZIDE   meclizine 12.5 MG tablet Commonly known as:  ANTIVERT   metoprolol tartrate 25 MG tablet Commonly known as:  LOPRESSOR   multivitamin with minerals Tabs tablet   OLANZapine 2.5 MG tablet Commonly known as:  ZYPREXA   pantoprazole 40 MG tablet Commonly known as:  PROTONIX   pentoxifylline 400 MG CR tablet Commonly known as:  TRENTAL   PRESERVISION AREDS 2 Caps   sennosides-docusate sodium 8.6-50 MG tablet Commonly known as:  SENOKOT-S   traMADol 50 MG tablet Commonly known as:  ULTRAM       No Known Allergies  Consultants:   General surgery  IR  Palliative care  Procedures:   Percutaneous cholecystostomy tube placement 07/02/16  MBS  07/09/16: Moderate risk for aspiration.  Antimicrobials:   Zosyn 06/30/16 ---> 07/04/16  Meropenem 07/04/16---> 07/09/16  Procedures/Studies: Ct Abdomen Pelvis Wo Contrast  Result Date: 06/30/2016 CLINICAL DATA:  Lower abdominal pain. Decreased p.o. intake. Weakness for 2 weeks. Vomiting. No urine output. EXAM: CT ABDOMEN AND PELVIS WITHOUT CONTRAST TECHNIQUE: Multidetector CT imaging of the abdomen and pelvis was performed following the standard protocol without IV contrast. COMPARISON:  CT of the abdomen and pelvis 03/15/2016. FINDINGS: Lower chest: Extensive pleural calcifications are again noted. Asymmetric left lower lobe airspace disease is present. Left-sided pleural thickening and a small effusion are noted. The heart is enlarged. Coronary artery calcifications are present. No significant right pleural or pericardial effusion is present. Hepatobiliary: No focal hepatic lesions are present. The gallbladder is hyperdense likely reflecting multiple stones. There is increased edema within the gallbladder wall. The common bile duct is within normal limits for age. There is extensive fluid surrounding gallbladder and liver. Pancreas: Unremarkable. No pancreatic ductal dilatation or surrounding inflammatory changes. Spleen: Splenic calcifications are present. There is free fluid along the edge of the spleen. Adrenals/Urinary Tract: Adrenal glands are normal bilaterally. Bilateral renal atrophy is again seen. No focal lesions are present. Ureters are within normal limits. The urinary bladder is unremarkable. Stomach/Bowel: The a moderate hiatal hernia present. The stomach and duodenum are otherwise within normal limits. The small bowel is unremarkable. Diverticular changes are  present within the ascending colon without inflammation centered about the colon. The transverse colon is mostly collapsed. Additional diverticular changes are present the descending and sigmoid colon without focal inflammation.  Vascular/Lymphatic: Dense atherosclerotic calcifications are present in the aorta and branch vessels. There are dense calcifications which may reflect stenoses at the origins of renal arteries and SMA. Iliac artery stenting is noted. Reproductive: Within normal limits for age. Other: Free fluid extends into the anatomic pelvis. There is no free air. Musculoskeletal: Endplate degenerative changes are most evident L5-S1. No focal lytic or blastic lesion present otherwise. The pelvis is intact. The hips are unremarkable. IMPRESSION: 1. Extensive free fluid inflammatory changes about the neck of the gallbladder suggesting acute cholecystitis in the setting of known gallstones and increased gallbladder wall edema. Gallbladder ultrasound may be useful further evaluation. 2. Diffuse abdominal ascites with free fluid about the liver and spleen and extending into the anatomic pelvis. 3. Ascending and descending colonic diverticulosis without focal inflammation about the colon to suggest diverticulitis. 4. Extensive atherosclerotic disease as described. 5. Multilevel spondylosis in the lumbar spine. 6. Asymmetric left lower lobe airspace disease and effusion. Infection is not excluded. 7. Cardiomegaly without failure. 8. Moderate-sized hiatal hernia. Electronically Signed   By: Marin Roberts M.D.   On: 06/30/2016 16:44   Nm Hepato W/eject Fract  Result Date: 07/01/2016 CLINICAL DATA:  Abdominal pain. EXAM: NUCLEAR MEDICINE HEPATOBILIARY IMAGING TECHNIQUE: Sequential images of the abdomen were obtained out to 60 minutes following intravenous administration of radiopharmaceutical. RADIOPHARMACEUTICALS:  5.2 millicuries mCi Tc-67m Choletec IV, 1.5 mg of morphine. COMPARISON:  CT 06/30/2016 . FINDINGS: Liver, biliary system, bowel visualize normally. The gallbladder does not visualize. Following administration of morphine the gallbladder also fails to visualize. IMPRESSION: Nonvisualization of the gallbladder  suggesting cholecystitis . Electronically Signed   By: Maisie Fus  Register   On: 07/01/2016 16:57   Ir Perc Cholecystostomy  Result Date: 07/02/2016 CLINICAL DATA:  Acute cholecystitis, non surgical candidate EXAM: PERCUTANEOUS CHOLECYSTOSTOMY TUBE PLACEMENT WITH ULTRASOUND AND FLUOROSCOPIC GUIDANCE FLUOROSCOPY TIME:  0.6 minute (24 uGym2 DAP) TECHNIQUE: The procedure, risks (including but not limited to bleeding, infection, organ damage ), benefits, and alternatives were explained to the daughter. Questions regarding the procedure were encouraged and answered. The daughter understands and consents to the procedure. Survey ultrasound of the abdomen was performed and an appropriate skin entry site was identified. Skin site was marked, prepped with chlorhexidine, and draped in usual sterile fashion, and infiltrated locally with 1% lidocaine. Intravenous Fentanyl and Versed were administered as conscious sedation during continuous monitoring of the patient's level of consciousness and physiological / cardiorespiratory status by the radiology RN, with a total moderate sedation time of 10 minutes. Under real-time ultrasound guidance, gallbladder was accessed using a transhepatic approach with a 21-gauge needle. Ultrasound image documentation was saved. Bile returned through the hub. Needle was exchanged over a 018 guidewire for transitional dilator which allowed placement of 035 J wire. Over this, a 10.2 French pigtail catheter was advanced and formed centrally in the gallbladder lumen. Small contrast injection confirmed appropriate position. Catheter secured externally with 0 Prolene suture and placed external drain bag. Patient tolerated the procedure well. COMPLICATIONS: COMPLICATIONS none IMPRESSION: 1. Technically successful percutaneous cholecystostomy tube placement with ultrasound and fluoroscopic guidance. Electronically Signed   By: Corlis Leak M.D.   On: 07/02/2016 16:58   Dg Chest Port 1 View  Result  Date: 07/05/2016 CLINICAL DATA:  Shortness of breath. EXAM: PORTABLE CHEST 1 VIEW COMPARISON:  06/17/2016  FINDINGS: Bilateral calcified pleural plaques. Small right and moderate left pleural effusion. Bilateral interstitial thickening. No pneumothorax. Stable cardiomegaly. Thoracic aortic atherosclerosis. No acute osseous abnormality. IMPRESSION: Findings most concerning for CHF. Electronically Signed   By: Elige Ko   On: 07/05/2016 08:49   Dg Abd Acute W/chest  Result Date: 06/17/2016 CLINICAL DATA:  Right lower quadrant pain. Nausea, vomiting for 4 days. EXAM: DG ABDOMEN ACUTE W/ 1V CHEST COMPARISON:  Chest x-ray 03/16/2016.  Abdominal CT 03/15/2016. FINDINGS: Stable bilateral calcified pleural plaques. There is cardiomegaly. No acute airspace opacities or effusions. Nonobstructive bowel gas pattern. No free air organomegaly. Bilateral iliac stents noted. Degenerative changes in the thoracolumbar spine and shoulders. No acute bony abnormality. IMPRESSION: Stable bilateral calcified pleural plaques. Cardiomegaly. No acute cardiopulmonary disease. No evidence of bowel obstruction or free air. Electronically Signed   By: Charlett Nose M.D.   On: 06/17/2016 12:53   Dg Abd Portable 1v  Result Date: 07/10/2016 CLINICAL DATA:  Repositioning of feeding tube. EXAM: PORTABLE ABDOMEN - 1 VIEW COMPARISON:  Prior today FINDINGS: A feeding tube is seen with tip now overlying the expected location of the pylorus or duodenal bulb. Residual oral contrast material is seen within the colon, with severe sigmoid diverticulosis noted. No evidence of bowel obstruction. Percutaneous cholecystostomy tube remains in place. Opacification of left lung base again noted. IMPRESSION: Feeding tube tip now overlies the pylorus or duodenal bulb. Electronically Signed   By: Myles Rosenthal M.D.   On: 07/10/2016 18:17   Dg Abd Portable 1v  Result Date: 07/10/2016 CLINICAL DATA:  Feeding tube placement. EXAM: PORTABLE ABDOMEN - 1 VIEW  COMPARISON:  Chest x-ray July 05, 2016 FINDINGS: Opacification of the left lower chest again identified. Probable small layering effusion on the right. The distal tip of the feeding tube projects over the left upper quadrant of the abdomen, probably just within the stomach. Recommend advancement/ repositioning before use. A pigtail catheter seen in the right mid abdomen. A vascular stent is seen in the left iliac region. Contrast is seen in the colon with colonic diverticulosis. IMPRESSION: The feeding tube likely terminates in the stomach just below the GE junction. Recommend repositioning/ advancement before use. Electronically Signed   By: Gerome Sam III M.D   On: 07/10/2016 17:13   Dg Swallowing Func-speech Pathology  Result Date: 07/09/2016 Objective Swallowing Evaluation: Type of Study: MBS-Modified Barium Swallow Study Patient Details Name: TORI DATTILIO MRN: [id number] Date of Birth: 1926-06-04 Today's Date: 07/09/2016 Time: SLP Start Time (ACUTE ONLY): 1040-SLP Stop Time (ACUTE ONLY): 1100 SLP Time Calculation (min) (ACUTE ONLY): 20 min Past Medical History: Past Medical History: Diagnosis Date . Atrial fibrillation (HCC)  . CAROTID STENOSIS  . CVA (cerebrovascular accident) (HCC) 2002 . DIABETES MELLITUS, TYPE II  . DIVERTICULOSIS, COLON  . DM (diabetes mellitus), type 2 (HCC)  . DVT (deep venous thrombosis) (HCC)  . Gastric ulcer  . GERD  . HTN (hypertension)  . Hx of adenomatous colonic polyps 2007 . Hyperlipidemia  . Hypertension  . OSTEOPENIA  . PVD (peripheral vascular disease) (HCC)   recently dx w/ carotid -left  int carotid >90% 50-60% rt int carotid 02/2010 . PVD (peripheral vascular disease) (HCC)   lf  ABI 0.11 and rt 0.43 . Sinus node dysfunction (HCC) 2011 . Stroke [facility]) July 01, 2014  Affected Left side . Upper GI bleed 02/2016 Past Surgical History: Past Surgical History: Procedure Laterality Date . CAROTID ENDARTERECTOMY   . cataract eye surgery   .  CNS vascular stent per  Neurosurgeon   . ESOPHAGOGASTRODUODENOSCOPY (EGD) WITH PROPOFOL N/A 02/24/2016  Procedure: ESOPHAGOGASTRODUODENOSCOPY (EGD) WITH PROPOFOL;  Surgeon: Kathi Der, MD;  Location: [facility] ENDOSCOPY;  Service: Gastroenterology;  Laterality: N/A; . EYE SURGERY   . ILIAC ARTERY STENT  2011  Bilateral CIA stent . IR GENERIC HISTORICAL  07/02/2016  IR PERC CHOLECYSTOSTOMY 07/02/2016 Oley Balm, MD [facility]-INTERV RAD . lazer eye surgery   . RADIOLOGY WITH ANESTHESIA N/A 03/20/2016  Procedure: MRI OF BRAIN;  Surgeon: Medication Radiologist, MD;  Location: [facility] OR;  Service: Radiology;  Laterality: N/A; . Right Shoulder Surgery  2010 . TUBAL LIGATION   HPI: Britini Garcilazo [name] a 83 y.o.femalewith medical history significant of dementia, CVA, DM2, diverticulosis, DVT, gastric ulcer, GERD, HTN and chronic atrial fibrillation, not anticoagulated secondary to previous GI bleed. Patient came into the hospital because of abdominal pain, nausea and vomiting. CT scan was done and showed acute cholecystitis. Pt also admitted with sepsis, AKI, hypokalemia, afib with RVR, malnutrition. S/p percutaneous cholecystostomy placement. Seen for swallowing evaluation during prior admission, found to have grossly intact oropharyngeal swallowing fx, regular diet with thin liquids recommended (07/05/14).  Subjective: Pt lethargic, family present Assessment / Plan / Recommendation CHL IP CLINICAL IMPRESSIONS 07/09/2016 Clinical Impression Patient presents with a mild oropharyngeal dysphagia, cognitively based in nature, characterized by oral holding of bolus (worse with solids), delayed oral transit, and intermittently delayed swallow initiation. Combination of delayed swallow and mildly decreased laryngeal strength results in flash penetration of liquids but no frank penetration or aspiration. Aspiration risk remains moderate at this time given general deconditioning and cognitive status which fluctuates.  SLP Visit Diagnosis Dysphagia, oropharyngeal  phase (R13.12) Attention and concentration deficit following -- Frontal lobe and executive function deficit following -- Impact on safety and function Moderate aspiration risk   CHL IP TREATMENT RECOMMENDATION 07/09/2016 Treatment Recommendations Therapy as outlined in treatment plan below   Prognosis 07/09/2016 Prognosis for Safe Diet Advancement Fair Barriers to Reach Goals Cognitive deficits Barriers/Prognosis Comment -- CHL IP DIET RECOMMENDATION 07/09/2016 SLP Diet Recommendations Dysphagia 1 (Puree) solids;Thin liquid Liquid Administration via Cup;Straw Medication Administration Crushed with puree Compensations Slow rate;Small sips/bites;Minimize environmental distractions Postural Changes Seated upright at 90 degrees   CHL IP OTHER RECOMMENDATIONS 07/09/2016 Recommended Consults -- Oral Care Recommendations Oral care BID Other Recommendations --   CHL IP FOLLOW UP RECOMMENDATIONS 07/09/2016 Follow up Recommendations (No Data)   CHL IP FREQUENCY AND DURATION 07/09/2016 Speech Therapy Frequency (ACUTE ONLY) min 2x/week Treatment Duration 2 weeks      CHL IP ORAL PHASE 07/09/2016 Oral Phase Impaired Oral - Pudding Teaspoon -- Oral - Pudding Cup -- Oral - Honey Teaspoon -- Oral - Honey Cup -- Oral - Nectar Teaspoon -- Oral - Nectar Cup -- Oral - Nectar Straw -- Oral - Thin Teaspoon -- Oral - Thin Cup Delayed oral transit;Lingual/palatal residue Oral - Thin Straw Delayed oral transit;Lingual/palatal residue Oral - Puree Delayed oral transit;Lingual/palatal residue Oral - Mech Soft -- Oral - Regular -- Oral - Multi-Consistency -- Oral - Pill -- Oral Phase - Comment --  CHL IP PHARYNGEAL PHASE 07/09/2016 Pharyngeal Phase Impaired Pharyngeal- Pudding Teaspoon -- Pharyngeal -- Pharyngeal- Pudding Cup -- Pharyngeal -- Pharyngeal- Honey Teaspoon -- Pharyngeal -- Pharyngeal- Honey Cup -- Pharyngeal -- Pharyngeal- Nectar Teaspoon -- Pharyngeal -- Pharyngeal- Nectar Cup -- Pharyngeal -- Pharyngeal- Nectar Straw -- Pharyngeal --  Pharyngeal- Thin Teaspoon -- Pharyngeal -- Pharyngeal- Thin Cup Delayed swallow initiation-vallecula Pharyngeal -- Pharyngeal- Thin Straw Delayed  swallow initiation-pyriform sinuses;Delayed swallow initiation-vallecula Pharyngeal -- Pharyngeal- Puree Delayed swallow initiation-vallecula Pharyngeal -- Pharyngeal- Mechanical Soft -- Pharyngeal -- Pharyngeal- Regular -- Pharyngeal -- Pharyngeal- Multi-consistency -- Pharyngeal -- Pharyngeal- Pill -- Pharyngeal -- Pharyngeal Comment --  CHL IP CERVICAL ESOPHAGEAL PHASE 07/09/2016 Cervical Esophageal Phase WFL Pudding Teaspoon -- Pudding Cup -- Honey Teaspoon -- Honey Cup -- Nectar Teaspoon -- Nectar Cup -- Nectar Straw -- Thin Teaspoon -- Thin Cup -- Thin Straw -- Puree -- Mechanical Soft -- Regular -- Multi-consistency -- Pill -- Cervical Esophageal Comment -- Ferdinand Lango MA, CCC-SLP [phone] McCoy Leah Meryl 07/09/2016, 11:50 AM                  Discharge Exam: Vitals:   07/11/16 2100 07/12/16 0552  BP: 113/75 (!) 94/54  Pulse: 81 68  Resp: 18 16  Temp: 97.9 F (36.6 C) 97.6 F (36.4 C)   Vitals:   07/11/16 1327 07/11/16 2100 07/12/16 0500 07/12/16 0552  BP: 112/71 113/75  (!) 94/54  Pulse: 84 81  68  Resp: 20 18  16   Temp: 98.3 F (36.8 C) 97.9 F (36.6 C)  97.6 F (36.4 C)  TempSrc: Oral Oral  Axillary  SpO2: 92% 91%  90%  Weight:   47.8 kg (105 lb 6.1 oz)   Height:        General exam: Appears calm, sleeping, somnolent  Respiratory system: Diminished. Respiratory effort normal. Cardiovascular system: S1 & S2 heard, Irreg rhythm. No JVD, murmurs, rubs, gallops or clicks. No pedal edema. Gastrointestinal system: Abdomen is nondistended, soft and +TTP RUQ. +Cholecystomy drain Central nervous system: Alert to voice, does not follow directions, falls asleep quickly  Extremities: Symmetric . Skin: No rashes, lesions or ulcers on exposed skin     The results of significant diagnostics from this hospitalization (including imaging,  microbiology, ancillary and laboratory) are listed below for reference.     Microbiology: Recent Results (from the past 240 hour(s))  Aerobic/Anaerobic Culture (surgical/deep wound)     Status: Abnormal   Collection Time: 07/02/16  5:19 PM  Result Value Ref Range Status   Specimen Description DRAINAGE BILE  Final   Special Requests NONE  Final   Gram Stain   Final    ABUNDANT WBC PRESENT, PREDOMINANTLY PMN NO ORGANISMS SEEN    Culture (A)  Final    MULTIPLE ORGANISMS PRESENT, NONE PREDOMINANT NO ANAEROBES ISOLATED    Report Status 07/07/2016 FINAL  Final  Culture, blood (Routine X 2) w Reflex to ID Panel     Status: None   Collection Time: 07/04/16  2:31 PM  Result Value Ref Range Status   Specimen Description BLOOD LEFT HAND  Final   Special Requests   Final    IN PEDIATRIC BOTTLE Blood Culture results may not be optimal due to an inadequate volume of blood received in culture bottles   Culture NO GROWTH 5 DAYS  Final   Report Status 07/09/2016 FINAL  Final  Culture, blood (Routine X 2) w Reflex to ID Panel     Status: None   Collection Time: 07/04/16  2:32 PM  Result Value Ref Range Status   Specimen Description BLOOD LEFT HAND  Final   Special Requests   Final    IN PEDIATRIC BOTTLE Blood Culture results may not be optimal due to an inadequate volume of blood received in culture bottles   Culture NO GROWTH 5 DAYS  Final   Report Status 07/09/2016 FINAL  Final  Labs: BNP (last 3 results)  Recent Labs  07/08/16 0334  BNP 1,256.3*   Basic Metabolic Panel:  Recent Labs Lab 07/08/16 0334 07/09/16 0235 07/10/16 0330 07/11/16 0349 07/12/16 0246  NA 143 144 145 150* 150*  K 3.9 3.3* 4.1 3.9 4.0  CL 110 109 112* 113* 116*  CO2 20* 23 18* 24 23  GLUCOSE 67 106* 72 81 154*  BUN 27* 27* 28* 29* 29*  CREATININE 1.92* 1.89* 1.76* 1.65* 1.71*  CALCIUM 8.4* 8.1* 8.0* 8.0* 7.8*  MG  --   --   --  1.6* 1.7  PHOS  --   --   --  4.2 4.4   Liver Function Tests: No  results for input(s): AST, ALT, ALKPHOS, BILITOT, PROT, ALBUMIN in the last 168 hours. No results for input(s): LIPASE, AMYLASE in the last 168 hours. No results for input(s): AMMONIA in the last 168 hours. CBC:  Recent Labs Lab 07/06/16 0232 07/07/16 0241 07/11/16 0349  WBC 16.4* 12.2* 9.0  HGB 9.6* 9.8* 9.1*  HCT 28.9* 30.4* 29.8*  MCV 83.5 83.3 88.4  PLT 331 417* 448*   Cardiac Enzymes: No results for input(s): CKTOTAL, CKMB, CKMBINDEX, TROPONINI in the last 168 hours. BNP: Invalid input(s): POCBNP CBG:  Recent Labs Lab 07/11/16 1630 07/11/16 2106 07/12/16 0204 07/12/16 0551 07/12/16 1022  GLUCAP 143* 124* 139* 127* 131*   D-Dimer No results for input(s): DDIMER in the last 72 hours. Hgb A1c No results for input(s): HGBA1C in the last 72 hours. Lipid Profile No results for input(s): CHOL, HDL, LDLCALC, TRIG, CHOLHDL, LDLDIRECT in the last 72 hours. Thyroid function studies No results for input(s): TSH, T4TOTAL, T3FREE, THYROIDAB in the last 72 hours.  Invalid input(s): FREET3 Anemia work up No results for input(s): VITAMINB12, FOLATE, FERRITIN, TIBC, IRON, RETICCTPCT in the last 72 hours. Urinalysis    Component Value Date/Time   COLORURINE AMBER (A) 06/30/2016 1638   APPEARANCEUR CLEAR 06/30/2016 1638   LABSPEC 1.020 06/30/2016 1638   PHURINE 5.0 06/30/2016 1638   GLUCOSEU NEGATIVE 06/30/2016 1638   GLUCOSEU NEGATIVE 05/19/2016 0959   HGBUR NEGATIVE 06/30/2016 1638   BILIRUBINUR NEGATIVE 06/30/2016 1638   KETONESUR NEGATIVE 06/30/2016 1638   PROTEINUR 30 (A) 06/30/2016 1638   UROBILINOGEN 2.0 (A) 05/19/2016 0959   NITRITE NEGATIVE 06/30/2016 1638   LEUKOCYTESUR NEGATIVE 06/30/2016 1638   Sepsis Labs Invalid input(s): PROCALCITONIN,  WBC,  LACTICIDVEN Microbiology Recent Results (from the past 240 hour(s))  Aerobic/Anaerobic Culture (surgical/deep wound)     Status: Abnormal   Collection Time: 07/02/16  5:19 PM  Result Value Ref Range Status    Specimen Description DRAINAGE BILE  Final   Special Requests NONE  Final   Gram Stain   Final    ABUNDANT WBC PRESENT, PREDOMINANTLY PMN NO ORGANISMS SEEN    Culture (A)  Final    MULTIPLE ORGANISMS PRESENT, NONE PREDOMINANT NO ANAEROBES ISOLATED    Report Status 07/07/2016 FINAL  Final  Culture, blood (Routine X 2) w Reflex to ID Panel     Status: None   Collection Time: 07/04/16  2:31 PM  Result Value Ref Range Status   Specimen Description BLOOD LEFT HAND  Final   Special Requests   Final    IN PEDIATRIC BOTTLE Blood Culture results may not be optimal due to an inadequate volume of blood received in culture bottles   Culture NO GROWTH 5 DAYS  Final   Report Status 07/09/2016 FINAL  Final  Culture, blood (  Routine X 2) w Reflex to ID Panel     Status: None   Collection Time: 07/04/16  2:32 PM  Result Value Ref Range Status   Specimen Description BLOOD LEFT HAND  Final   Special Requests   Final    IN PEDIATRIC BOTTLE Blood Culture results may not be optimal due to an inadequate volume of blood received in culture bottles   Culture NO GROWTH 5 DAYS  Final   Report Status 07/09/2016 FINAL  Final     Time coordinating discharge: 40 minutes  SIGNED:  Noralee Stain, DO [facility] Pager [phone]  If 7PM-7AM, please contact night-coverage [email or web address] Password [email or web address] 07/12/2016, 2:36 PM

## 2016-07-12 NOTE — Progress Notes (Signed)
SLP Cancellation Note  Patient Details Name: Tracy [name] MRN: [id number] DOB: [birth year]   Cancelled treatment:       Reason Eval/Treat Not Completed: Medical issues which prohibited therapy. Patient not alert, responsive enough for po intake this am. Will f/u.   Ferdinand Lango MA, CCC-SLP [phone]    [name] 07/12/2016, 11:30 AM

## 2016-07-12 NOTE — Care Management Important Message (Signed)
Important Message  Patient Details  Name: Tracy [name] MRN: [id number] Date of Birth: 06-15-1926   Medicare Important Message Given:  Yes    Kyla Balzarine 07/12/2016, 4:33 PM

## 2016-07-12 NOTE — Progress Notes (Signed)
[facility] Liaison: RN visit  Received request from [place] for family interest in [facility] with request for transfer today.  Chart reviewed and received report from bedside RN. Met patient son, Celenia Hruska to confirm interest and explain services. Patient not able to participate in discussion at this time.  Son agreeable to transfer today. Registration paper work completed and questions/concerns addressed.  RN Anderson Malta and CSW Caryl Pina made aware. RN to d/c PIV before discharge.  Dr. Orpah Melter to assume care per family request.  Please fax discharge summary to [phone]. RN please call report to [phone].  Please arrange transport for patient to arrive as soon as possible.    Thank you, Gar Ponto, [facility] Liaison [phone]

## 2016-07-12 NOTE — Progress Notes (Signed)
Clinical Social Worker facilitated patient discharge including contacting patient family and facility to confirm patient discharge plans.  Clinical information faxed to facility and family agreeable with plan.  CSW arranged ambulance transport via [facility] to [facility].  RN to call [phone] for report prior to discharge.  Clinical Social Worker will sign off for now as social work intervention is no longer needed. Please consult us again if new need arises.  Darrian Goodwill, MSW, LCSWA [phone]  

## 2016-07-12 NOTE — Progress Notes (Signed)
Per order, NG tube was flushed with water and removed without difficulty.

## 2016-07-12 NOTE — Progress Notes (Signed)
PROGRESS NOTE    CINZIA DEVOS  ZOX:[id number] DOB: Jan 02, 1927 DOA: 06/30/2016 PCP: Oliver Barre, MD     Brief Narrative:  KERSTIE AGENT is an 83 y.o. female with medical history significant of dementia, history of CVA and chronic atrial fibrillation, not anticoagulated secondary to previous GI bleed. Patient presented to the hospital on 06/30/16 because of abdominal pain, nausea and vomiting. HIDA scan showed acute cholecystitis, general surgery recommended cholecystostomy as she was not felt to be a surgical candidate. Cholecystostomy done on 07/02/16. Hospital course complicated by persistent mental status changes and poor oral intake. Palliative care has been consulted.   Assessment & Plan:   Principal Problem:   Sepsis (HCC) Active Problems:   Atrial fibrillation with RVR (HCC)   Acute on chronic diastolic CHF (congestive heart failure) (HCC)   Chronic atrial fibrillation (HCC)   Hypokalemia   Loss of appetite   AKI (acute kidney injury) (HCC)   Abdominal pain   Acute cholecystitis   Protein-calorie malnutrition, severe   Acute delirium   Palliative care by specialist   Goals of care, counseling/discussion  Sepsis secondary to acute cholecystitis Patient presented to the hospital with HR of 150, WBC of 20 and presence of infection (acute cholecystitis). There was evidence of hypoperfusion with elevated lactic acid of 2.3. CT scan showed acute cholecystitis. Gen. surgery consulted, recommended nonsurgical approach as patient has multiple comorbid conditions. HIDA scan was positive, IR placed percutaneous cholecystostomy 07/02/16. Was initially on Zosyn, subsequently switched to meropenem on 07/05/16 due to persistent leukocytosis. Sepsis physiology resolved, and meropenem subsequently discontinued 07/09/16. Blood cultures negative to date. Bile cultures with multiple organisms. Patient continues to be weak and deconditioned. Palliative care following. Continue percutaneous  cholecystostomy drainage for 6-8 weeks. Will need to follow-up in the drain clinic after drain study performed per surgery recommendations.  Acute delirium/dementia This was likely secondary to pain medications, acute illness and dehydration in the background of dementia. Sedative medications discontinued, only she is on Zyprexa at night. Patient more somnolent this morning.   AKI/pulmonary edema Baseline creatinine is 0.5 from 05/18/16. Current creatinine remains elevated over usual baseline values. Creatinine has been stable with diuresis however.  Hypernatremia  Start free water flushes through NG, will increase frequency today due to lack of change.   Atrial fibrillation with RVR/acute on chronic diastolic CHF/pulmonary edema Continue Cardizem and Toprol-XL. CHA2DS2-VAScis likely greater than 6, (female, HTN, 2+ CVA and 2+ age). She is on low-dose aspirin, as anticoagulation is contraindicated secondary to recent significant GI bleed. Continue Cardizem/metoprolol for rate control. Developed significant dyspnea with evidence of decompensated CHF during hospital stay, status post 3 doses of IV Lasix 3 days. Now on oral Lasix, 40 mg daily. Respiratory status now stable.  Severe protein energy malnutrition Seen by dietitian with recommendations to place an NG tube for supplemental nutrition. Continues to have poor oral intake. Tube feedings initiated after placement of a NG tube. Dietitian following for recommendations.  Dysphagia ST following.  Had a modified barium swallow study and is at moderate risk for aspiration. Diet advanced to pured with thin liquids. S/P palliative care consult, family initially wanted to avoid placement of a feeding tube, but subsequently agreed to a short-term nasogastric tube to see if improved intake allowed her to make gains.   PVD Continue Trental.  Normocytic anemia Likely anemia of chronic disease. Hemoglobin stable.   DVT prophylaxis: heparin  subq Code Status: DNR Family Communication: no family at bedside Disposition Plan: Pending further  family meeting with palliative care today. Patient continues to be somnolent, not awake enough to have adequate oral intake. Currently tolerating tube feeds, but prognosis remains poor. Would recommend hospice. Will discuss with family and with palliative care team today.    Consultants:   General surgery  IR  Palliative care  Procedures:   Percutaneous cholecystostomy tube placement 07/02/16  MBS 07/09/16: Moderate risk for aspiration.  Antimicrobials:   Zosyn 06/30/16 ---> 07/04/16  Meropenem 07/04/16---> 07/09/16   Subjective: Patient less alert today, does open eyes to verbal/physical stimuli, but less so than compared to yesterday. No reports of patient having oral intake, but is tolerating tube feeds.   Objective: Vitals:   07/11/16 1327 07/11/16 2100 07/12/16 0500 07/12/16 0552  BP: 112/71 113/75  (!) 94/54  Pulse: 84 81  68  Resp: Temp: 98.3 F (36.8 C) 97.9 F (36.6 C)  97.6 F (36.4 C)  TempSrc: Oral Oral  Axillary  SpO2: 92% 91%  90%  Weight:   47.8 kg (105 lb 6.1 oz)   Height:        Intake/Output Summary (Last 24 hours) at 07/12/16 0846 Last data filed at 07/12/16 0516  Gross per 24 hour  Intake           751.17 ml  Output              350 ml  Net           401.17 ml   Filed Weights   07/10/16 0500 07/11/16 0407 07/12/16 0500  Weight: 43.7 kg (96 lb 4.8 oz) 47.4 kg (104 lb 9.6 oz) 47.8 kg (105 lb 6.1 oz)    Examination:  General exam: Appears calm, sleeping, somnolent  Respiratory system: Diminished. Respiratory effort normal. Cardiovascular system: S1 & S2 heard, Irreg rhythm. No JVD, murmurs, rubs, gallops or clicks. No pedal edema. Gastrointestinal system: Abdomen is nondistended, soft and +TTP RUQ. +Cholecystomy drain Central nervous system: Alert to voice, does not follow directions, falls asleep quickly  Extremities: Symmetric . Skin:  No rashes, lesions or ulcers on exposed skin   Data Reviewed: I have personally reviewed following labs and imaging studies  CBC:  Recent Labs Lab 07/06/16 0232 07/07/16 0241 07/11/16 0349  WBC 16.4* 12.2* 9.0  HGB 9.6* 9.8* 9.1*  HCT 28.9* 30.4* 29.8*  MCV 83.5 83.3 88.4  PLT 331 417* 448*   Basic Metabolic Panel:  Recent Labs Lab 07/08/16 0334 07/09/16 0235 07/10/16 0330 07/11/16 0349 07/12/16 0246  NA 143 144 145 150* 150*  K 3.9 3.3* 4.1 3.9 4.0  CL 110 109 112* 113* 116*  CO2 20* 23 18* 24 23  GLUCOSE 67 106* 72 81 154*  BUN 27* 27* 28* 29* 29*  CREATININE 1.92* 1.89* 1.76* 1.65* 1.71*  CALCIUM 8.4* 8.1* 8.0* 8.0* 7.8*  MG  --   --   --  1.6* 1.7  PHOS  --   --   --  4.2 4.4   GFR: Estimated Creatinine Clearance: 15.8 mL/min (A) (by C-G formula based on SCr of 1.71 mg/dL (H)). Liver Function Tests: No results for input(s): AST, ALT, ALKPHOS, BILITOT, PROT, ALBUMIN in the last 168 hours. No results for input(s): LIPASE, AMYLASE in the last 168 hours. No results for input(s): AMMONIA in the last 168 hours. Coagulation Profile: No results for input(s): INR, PROTIME in the last 168 hours. Cardiac Enzymes: No results for input(s): CKTOTAL, CKMB, CKMBINDEX, TROPONINI in the last 168 hours.  BNP (last 3 results) No results for input(s): PROBNP in the last 8760 hours. HbA1C: No results for input(s): HGBA1C in the last 72 hours. CBG:  Recent Labs Lab 07/11/16 1208 07/11/16 1630 07/11/16 2106 07/12/16 0204 07/12/16 0551  GLUCAP 125* 143* 124* 139* 127*   Lipid Profile: No results for input(s): CHOL, HDL, LDLCALC, TRIG, CHOLHDL, LDLDIRECT in the last 72 hours. Thyroid Function Tests: No results for input(s): TSH, T4TOTAL, FREET4, T3FREE, THYROIDAB in the last 72 hours. Anemia Panel: No results for input(s): VITAMINB12, FOLATE, FERRITIN, TIBC, IRON, RETICCTPCT in the last 72 hours. Sepsis Labs: No results for input(s): PROCALCITON, LATICACIDVEN in the last  168 hours.  Recent Results (from the past 240 hour(s))  Aerobic/Anaerobic Culture (surgical/deep wound)     Status: Abnormal   Collection Time: 07/02/16  5:19 PM  Result Value Ref Range Status   Specimen Description DRAINAGE BILE  Final   Special Requests NONE  Final   Gram Stain   Final    ABUNDANT WBC PRESENT, PREDOMINANTLY PMN NO ORGANISMS SEEN    Culture (A)  Final    MULTIPLE ORGANISMS PRESENT, NONE PREDOMINANT NO ANAEROBES ISOLATED    Report Status 07/07/2016 FINAL  Final  Culture, blood (Routine X 2) w Reflex to ID Panel     Status: None   Collection Time: 07/04/16  2:31 PM  Result Value Ref Range Status   Specimen Description BLOOD LEFT HAND  Final   Special Requests   Final    IN PEDIATRIC BOTTLE Blood Culture results may not be optimal due to an inadequate volume of blood received in culture bottles   Culture NO GROWTH 5 DAYS  Final   Report Status 07/09/2016 FINAL  Final  Culture, blood (Routine X 2) w Reflex to ID Panel     Status: None   Collection Time: 07/04/16  2:32 PM  Result Value Ref Range Status   Specimen Description BLOOD LEFT HAND  Final   Special Requests   Final    IN PEDIATRIC BOTTLE Blood Culture results may not be optimal due to an inadequate volume of blood received in culture bottles   Culture NO GROWTH 5 DAYS  Final   Report Status 07/09/2016 FINAL  Final       Radiology Studies: Dg Abd Portable 1v  Result Date: 07/10/2016 CLINICAL DATA:  Repositioning of feeding tube. EXAM: PORTABLE ABDOMEN - 1 VIEW COMPARISON:  Prior today FINDINGS: A feeding tube is seen with tip now overlying the expected location of the pylorus or duodenal bulb. Residual oral contrast material is seen within the colon, with severe sigmoid diverticulosis noted. No evidence of bowel obstruction. Percutaneous cholecystostomy tube remains in place. Opacification of left lung base again noted. IMPRESSION: Feeding tube tip now overlies the pylorus or duodenal bulb. Electronically  Signed   By: Myles Rosenthal M.D.   On: 07/10/2016 18:17   Dg Abd Portable 1v  Result Date: 07/10/2016 CLINICAL DATA:  Feeding tube placement. EXAM: PORTABLE ABDOMEN - 1 VIEW COMPARISON:  Chest x-ray July 05, 2016 FINDINGS: Opacification of the left lower chest again identified. Probable small layering effusion on the right. The distal tip of the feeding tube projects over the left upper quadrant of the abdomen, probably just within the stomach. Recommend advancement/ repositioning before use. A pigtail catheter seen in the right mid abdomen. A vascular stent is seen in the left iliac region. Contrast is seen in the colon with colonic diverticulosis. IMPRESSION: The feeding tube likely terminates in the  stomach just below the GE junction. Recommend repositioning/ advancement before use. Electronically Signed   By: Gerome Sam III M.D   On: 07/10/2016 17:13      Scheduled Meds: . acetaminophen (TYLENOL) oral liquid 160 mg/5 mL  650 mg Per Tube Q6H  . aspirin  81 mg Per Tube Daily  . diltiazem  30 mg Per Tube Q6H  . feeding supplement (OSMOLITE 1.2 CAL)  1,000 mL Per Tube Q24H  . free water  100 mL Per Tube Q6H  . furosemide  40 mg Per Tube Daily  . heparin  5,000 Units Subcutaneous Q8H  . metoprolol tartrate  12.5 mg Per Tube BID  . multivitamin  15 mL Per Tube Daily  . OLANZapine zydis  2.5 mg Oral QHS  . potassium chloride  20 mEq Oral Daily  . sodium chloride flush  3 mL Intravenous Q12H  . thiamine  100 mg Per Tube Daily   Continuous Infusions:   LOS: 12 days    Time spent: 30 minutes   Noralee Stain, DO [facility] [email or web address] Password [facility] 07/12/2016, 8:46 AM

## 2016-07-12 NOTE — Progress Notes (Signed)
PT Cancellation Note  Patient Details Name: Tracy [name] MRN: [id number] DOB: January 11, 1927   Cancelled Treatment:    Reason Eval/Treat Not Completed: RN declined as pt is unresponsive and not able to participate in therapy at this time. Pending palliative care meeting. PT will follow up as able.   Lane Hacker, [facility] SPT [phone]   Lane Hacker 07/12/2016, 8:28 AM

## 2016-07-12 NOTE — Care Management Note (Signed)
Case Management Note Donn Pierini RN, BSN Unit 2W-Case Manager [phone]  Patient Details  Name: Tracy [name] MRN: [id number] Date of Birth: [birth year]  Subjective/Objective:   Pt admitted with sepsis, CT + for cholecystitis- s/p perc. Drain by IR, post procedure with persistent mental status changes and poor po intake                Action/Plan: PTA pt lived at home, per PT eval recommendations for SNF- CSW following for placement needs, PC consulted for GOC  Expected Discharge Date:  07/12/16               Expected Discharge Plan:  Hospice Medical Facility  In-House Referral:  Clinical Social Work  Discharge planning Services  CM Consult  Post Acute Care Choice:  NA Choice offered to:  NA  DME Arranged:    DME Agency:     HH Arranged:    HH Agency:     Status of Service:  Completed, signed off  If discussed at [employer], dates discussed:    Discharge Disposition: Hospice facility- [facility]   Additional Comments:  07/12/16- 1600- Donn Pierini RN, CM- PC did f/u with family today decision has been made to make pt full comfort care and transition to residential hospice- CSW aware and will f/u with family for d/c needs to Residential Hospice. Plan for d/c to [facility].   07/09/16- 1500- Adwoa Axe rN, CM- pt still with poor po intake, PC consulted- plan for TF and watch over weekend to see how pt responds. PC to f/u with family on Monday.   Darrold Span, RN 07/12/2016, 4:13 PM

## 2016-07-14 ENCOUNTER — Encounter (HOSPITAL_COMMUNITY): Payer: Medicare Other

## 2016-07-14 ENCOUNTER — Ambulatory Visit: Payer: Medicare Other | Admitting: Family

## 2016-07-15 ENCOUNTER — Other Ambulatory Visit: Payer: Self-pay | Admitting: Internal Medicine

## 2016-07-15 DIAGNOSIS — K819 Cholecystitis, unspecified: Secondary | ICD-10-CM

## 2016-08-03 DEATH — deceased

## 2016-08-23 ENCOUNTER — Ambulatory Visit: Payer: Self-pay | Admitting: General Practice

## 2016-11-30 ENCOUNTER — Ambulatory Visit: Payer: Medicare Other | Admitting: Internal Medicine

## 2017-02-13 IMAGING — MR MR HEAD W/O CM
8 series · 48 of 48 positions shown · non-contrast
Comparison: Prior MRI from 02/07/2016.

CLINICAL DATA: Initial evaluation for acute confusion.

EXAM:
MRI HEAD WITHOUT CONTRAST
TECHNIQUE: Multiplanar, multiecho pulse sequences of the brain and surrounding
structures were obtained without intravenous contrast.

[Series 4: DWI · axial · 3.0mm · 1.09mm/px · z∈[-65,+80]mm · 7 of 49 slices shown (1 of 6)]
[im 1/49]
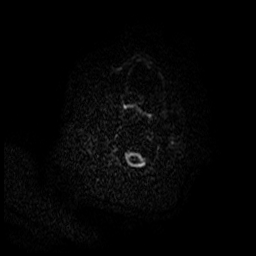
[im 9/49]
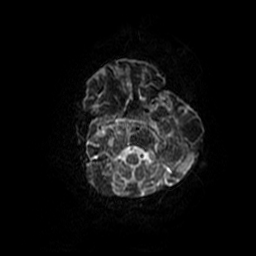
[im 17/49]
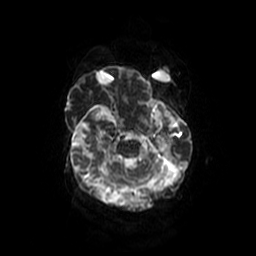
[im 25/49]
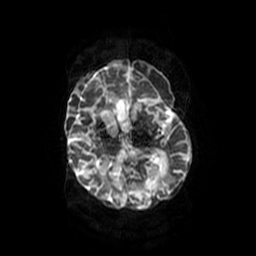
[im 33/49]
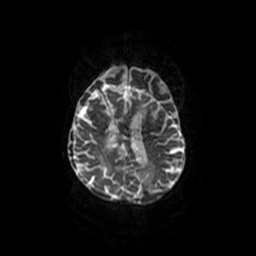
[im 41/49]
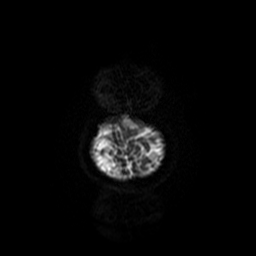
[im 49/49]
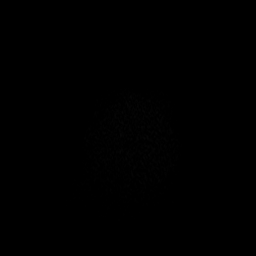

[Series 5: DWI · coronal · 5.0mm · 1.09mm/px · 9 of 68 slices shown (2 of 6)]
[im 1/68]
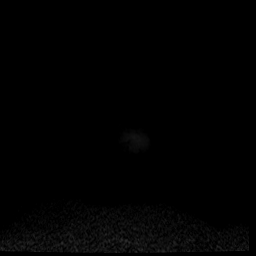
[im 9/68]
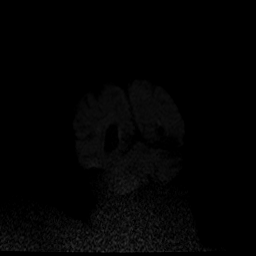
[im 17/68]
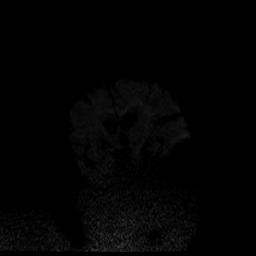
[im 26/68]
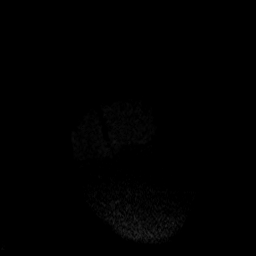
[im 34/68]
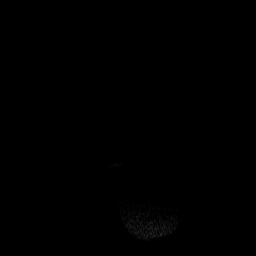
[im 42/68]
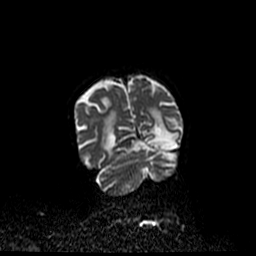
[im 51/68]
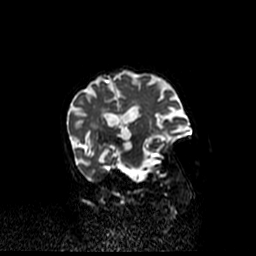
[im 59/68]
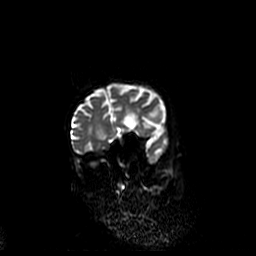
[im 68/68]
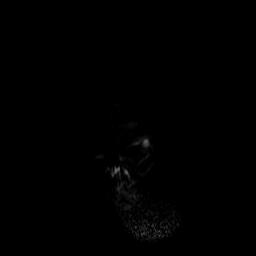

[Series 8: DWI · axial · 3.0mm · 1.09mm/px · z∈[-56,+79]mm · 12 of 93 slices shown (3 of 6)]
[im 1/93]
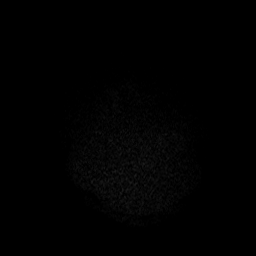
[im 9/93]
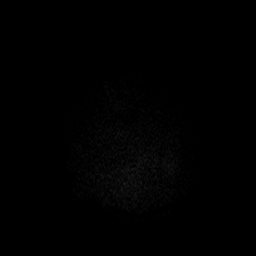
[im 17/93]
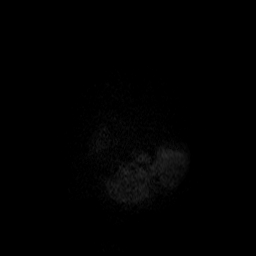
[im 26/93]
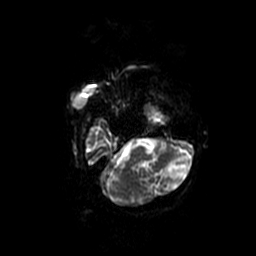
[im 34/93]
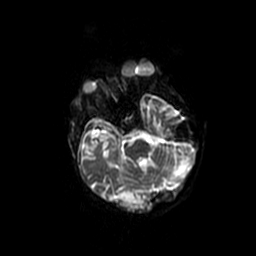
[im 42/93]
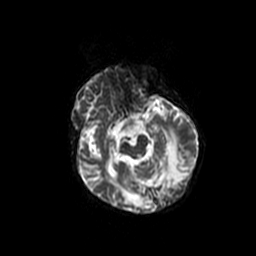
[im 51/93]
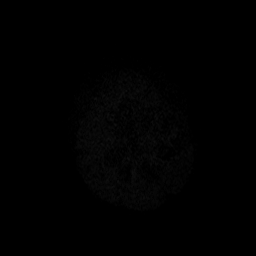
[im 59/93]
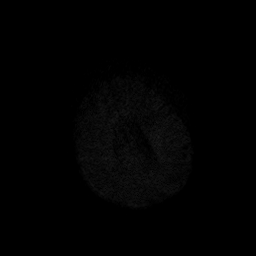
[im 67/93]
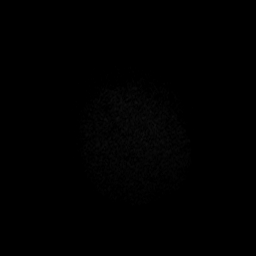
[im 76/93]
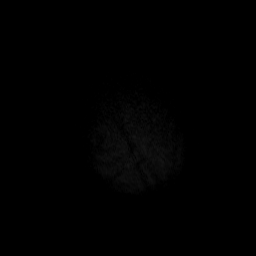
[im 84/93]
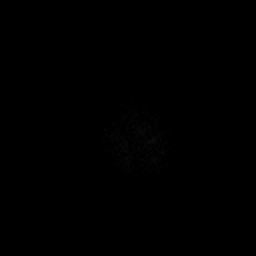
[im 93/93]
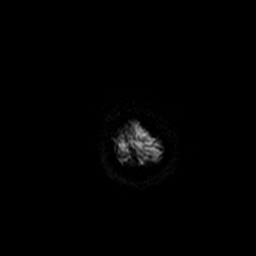

[Series 9: FLAIR · axial · 5.0mm · 0.43mm/px · z∈[-63,+72]mm · 3 of 24 slices shown]
[im 1/24]
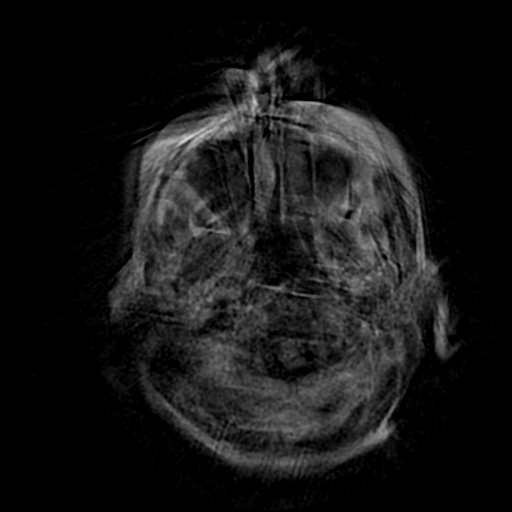
[im 12/24]
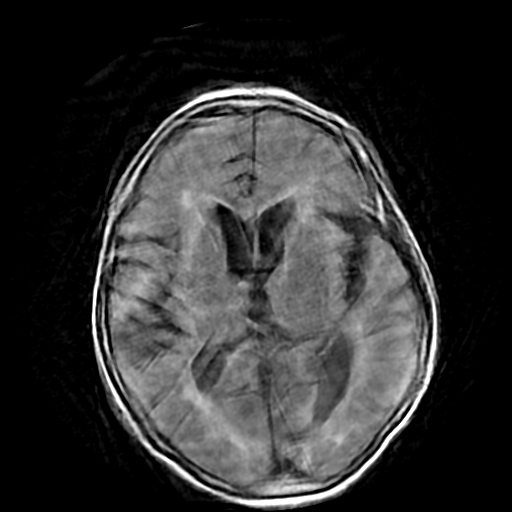
[im 24/24]
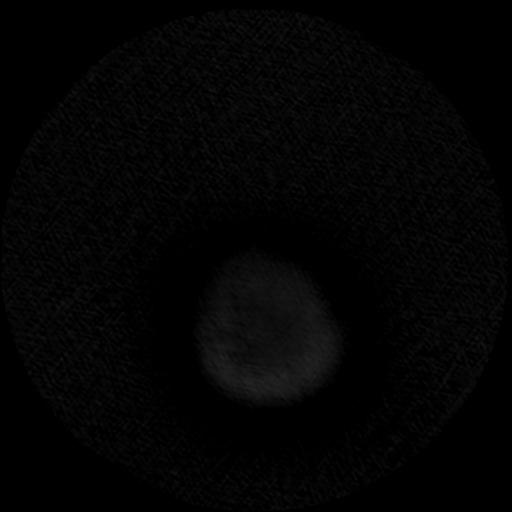

[Series 10: T2 · axial · 5.0mm · 0.43mm/px · z∈[-63,+72]mm · 3 of 24 slices shown]
[im 1/24]
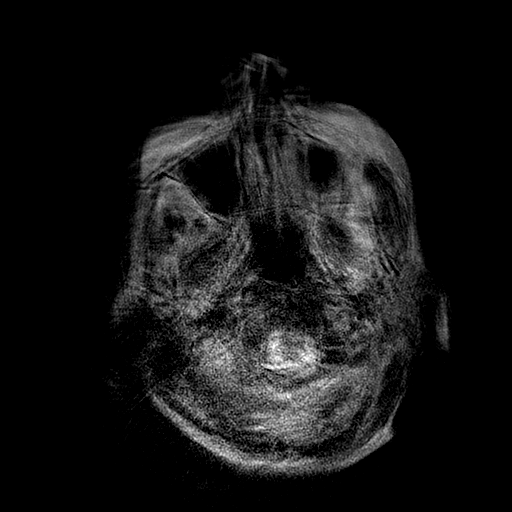
[im 12/24]
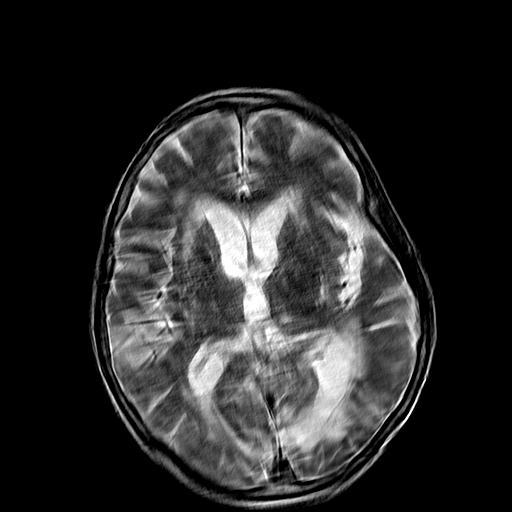
[im 24/24]
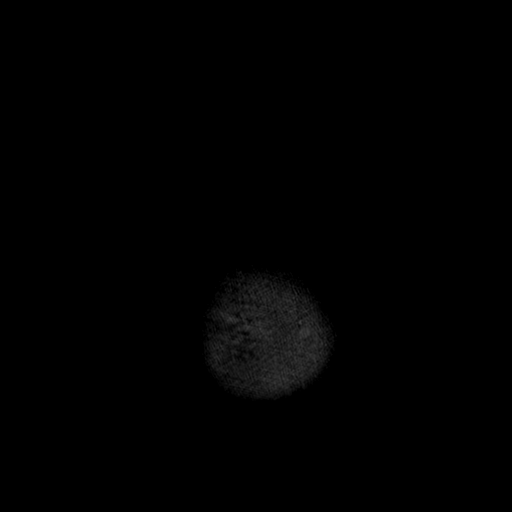

[Series 500: DWI · coronal · 5.0mm · 1.09mm/px · 4 of 34 slices shown (4 of 6)]
[im 1/34]
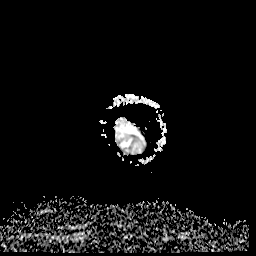
[im 12/34]
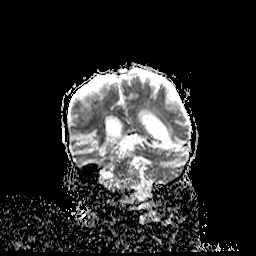
[im 23/34]
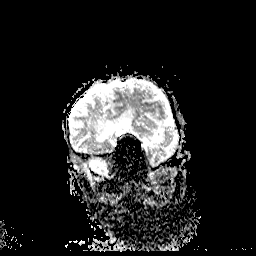
[im 34/34]
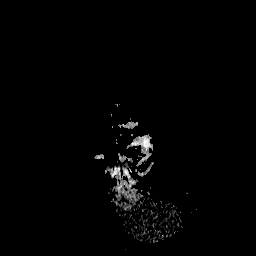

[Series 502: DWI · coronal · 5.0mm · 1.09mm/px · 4 of 34 slices shown (5 of 6)]
[im 1/34]
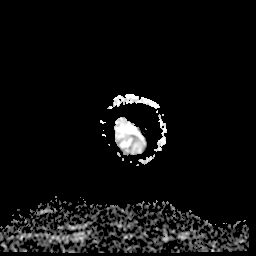
[im 12/34]
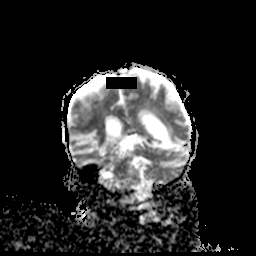
[im 23/34]
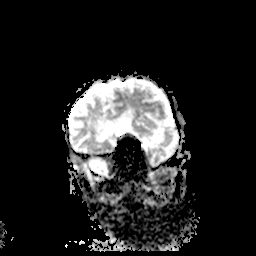
[im 34/34]
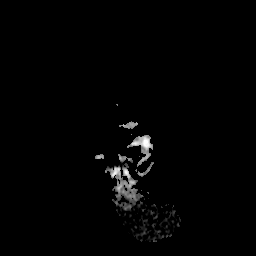

[Series 800: DWI · axial · 3.0mm · 1.09mm/px · z∈[-56,+79]mm · 6 of 47 slices shown (6 of 6)]
[im 1/47]
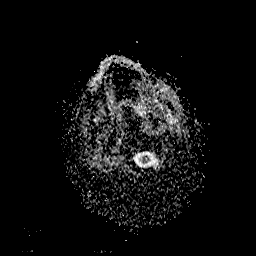
[im 10/47]
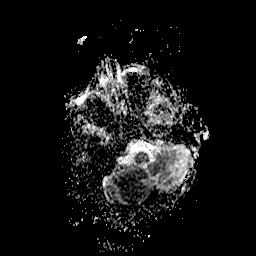
[im 19/47]
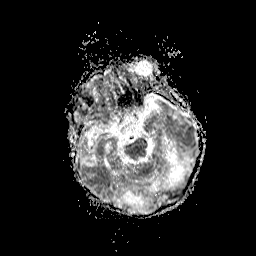
[im 28/47]
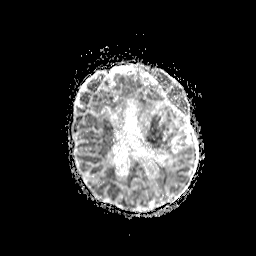
[im 37/47]
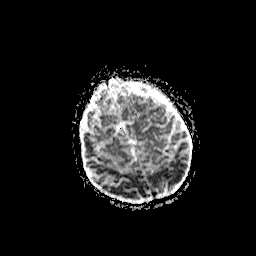
[im 47/47]
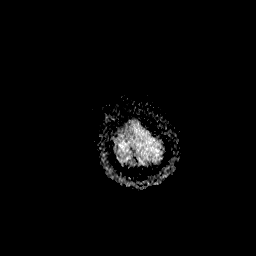

[48 of 48 positions shown; findings below may reference images not displayed]

FINDINGS: Brain: Study markedly limited due to patient's inability to tolerate
the full exam. Additionally, images provided are markedly degraded
by motion artifact.

Diffusion weighted imaging demonstrates no definite evidence for
acute intracranial infarct. Cerebral atrophy with chronic
microvascular ischemic disease is grossly stable. Similarly, remote
bilateral occipital and left cerebellar infarcts also grossly
stable. Remote lacunar infarcts again noted within the bilateral
basal ganglia. No definite new acute intracranial process.
Ventricles grossly stable in size without hydrocephalus. No definite
mass lesion or midline shift. No extra-axial fluid collection.

Vascular: Major intracranial vascular flow voids are grossly patent.

Skull and upper cervical spine: Scalp soft tissues and calvarium
grossly unremarkable. Craniocervical junction and skull base not
well evaluated on this exam.

Sinuses/Orbits: Orbital soft tissues and globes grossly
unremarkable. Paranasal sinuses and mastoids are grossly clear.
IMPRESSION: 1. Severely limited study due to extensive motion artifact and
patient's inability to tolerate the full length of the exam.
2. No definite acute abnormality identified. If there remains high
clinical suspicion for possible occult intracranial process, a
repeat study when the patient is able to tolerate the exam could be
attempted.

## 2019-04-27 NOTE — Telephone Encounter (Signed)
error
